# Patient Record
Sex: Male | Born: 1965 | Race: White | Hispanic: No | Marital: Married | Smoking: Former smoker
Health system: Southern US, Community
[De-identification: ages and names within clinical notes are randomized; demographics above are authoritative.]

## PROBLEM LIST (undated history)

## (undated) DIAGNOSIS — G4730 Sleep apnea, unspecified: Secondary | ICD-10-CM

## (undated) DIAGNOSIS — E039 Hypothyroidism, unspecified: Secondary | ICD-10-CM

## (undated) DIAGNOSIS — G8929 Other chronic pain: Secondary | ICD-10-CM

## (undated) DIAGNOSIS — K633 Ulcer of intestine: Secondary | ICD-10-CM

## (undated) DIAGNOSIS — N189 Chronic kidney disease, unspecified: Secondary | ICD-10-CM

## (undated) DIAGNOSIS — M549 Dorsalgia, unspecified: Secondary | ICD-10-CM

## (undated) DIAGNOSIS — F419 Anxiety disorder, unspecified: Secondary | ICD-10-CM

## (undated) DIAGNOSIS — F32A Depression, unspecified: Secondary | ICD-10-CM

## (undated) DIAGNOSIS — R748 Abnormal levels of other serum enzymes: Secondary | ICD-10-CM

## (undated) DIAGNOSIS — L509 Urticaria, unspecified: Secondary | ICD-10-CM

## (undated) DIAGNOSIS — K219 Gastro-esophageal reflux disease without esophagitis: Secondary | ICD-10-CM

## (undated) DIAGNOSIS — E785 Hyperlipidemia, unspecified: Secondary | ICD-10-CM

## (undated) DIAGNOSIS — M25552 Pain in left hip: Secondary | ICD-10-CM

## (undated) DIAGNOSIS — M1631 Unilateral osteoarthritis resulting from hip dysplasia, right hip: Secondary | ICD-10-CM

## (undated) DIAGNOSIS — I10 Essential (primary) hypertension: Secondary | ICD-10-CM

## (undated) DIAGNOSIS — K5792 Diverticulitis of intestine, part unspecified, without perforation or abscess without bleeding: Secondary | ICD-10-CM

## (undated) DIAGNOSIS — K529 Noninfective gastroenteritis and colitis, unspecified: Secondary | ICD-10-CM

## (undated) DIAGNOSIS — J40 Bronchitis, not specified as acute or chronic: Secondary | ICD-10-CM

## (undated) DIAGNOSIS — M79605 Pain in left leg: Secondary | ICD-10-CM

## (undated) DIAGNOSIS — F5104 Psychophysiologic insomnia: Secondary | ICD-10-CM

## (undated) DIAGNOSIS — L409 Psoriasis, unspecified: Secondary | ICD-10-CM

## (undated) DIAGNOSIS — I2510 Atherosclerotic heart disease of native coronary artery without angina pectoris: Secondary | ICD-10-CM

## (undated) DIAGNOSIS — E119 Type 2 diabetes mellitus without complications: Secondary | ICD-10-CM

## (undated) DIAGNOSIS — M1990 Unspecified osteoarthritis, unspecified site: Secondary | ICD-10-CM

## (undated) HISTORY — DX: Psychophysiologic insomnia: F51.04

## (undated) HISTORY — PX: SHOULDER SURGERY: SHX246

## (undated) HISTORY — PX: CERVICAL SPINE SURGERY: SHX589

## (undated) HISTORY — DX: Urticaria, unspecified: L50.9

## (undated) HISTORY — DX: Psoriasis, unspecified: L40.9

## (undated) HISTORY — PX: THYROIDECTOMY: SHX17

## (undated) HISTORY — DX: Diverticulitis of intestine, part unspecified, without perforation or abscess without bleeding: K57.92

## (undated) HISTORY — DX: Noninfective gastroenteritis and colitis, unspecified: K52.9

## (undated) HISTORY — DX: Abnormal levels of other serum enzymes: R74.8

## (undated) HISTORY — PX: BACK SURGERY: SHX140

## (undated) HISTORY — DX: Atherosclerotic heart disease of native coronary artery without angina pectoris: I25.10

## (undated) HISTORY — PX: JOINT REPLACEMENT: SHX530

## (undated) HISTORY — DX: Ulcer of intestine: K63.3

## (undated) HISTORY — PX: ROTATOR CUFF REPAIR: SHX139

## (undated) HISTORY — PX: APPENDECTOMY: SHX54

## (undated) HISTORY — DX: Hyperlipidemia, unspecified: E78.5

---

## 2001-12-24 ENCOUNTER — Emergency Department (HOSPITAL_COMMUNITY): Admission: EM | Admit: 2001-12-24 | Discharge: 2001-12-24 | Payer: Self-pay | Admitting: *Deleted

## 2001-12-24 ENCOUNTER — Encounter: Payer: Self-pay | Admitting: *Deleted

## 2002-05-24 ENCOUNTER — Encounter: Payer: Self-pay | Admitting: Internal Medicine

## 2002-05-24 ENCOUNTER — Emergency Department (HOSPITAL_COMMUNITY): Admission: EM | Admit: 2002-05-24 | Discharge: 2002-05-24 | Payer: Self-pay | Admitting: Emergency Medicine

## 2002-05-29 ENCOUNTER — Encounter: Payer: Self-pay | Admitting: Internal Medicine

## 2002-05-29 ENCOUNTER — Ambulatory Visit (HOSPITAL_COMMUNITY): Admission: RE | Admit: 2002-05-29 | Discharge: 2002-05-29 | Payer: Self-pay | Admitting: Internal Medicine

## 2002-10-08 ENCOUNTER — Encounter: Payer: Self-pay | Admitting: Family Medicine

## 2002-10-08 ENCOUNTER — Encounter (HOSPITAL_COMMUNITY): Admission: RE | Admit: 2002-10-08 | Discharge: 2002-11-07 | Payer: Self-pay | Admitting: Family Medicine

## 2002-10-09 ENCOUNTER — Encounter: Payer: Self-pay | Admitting: Family Medicine

## 2003-05-22 ENCOUNTER — Emergency Department (HOSPITAL_COMMUNITY): Admission: EM | Admit: 2003-05-22 | Discharge: 2003-05-22 | Payer: Self-pay | Admitting: Emergency Medicine

## 2004-01-12 ENCOUNTER — Emergency Department (HOSPITAL_COMMUNITY): Admission: EM | Admit: 2004-01-12 | Discharge: 2004-01-12 | Payer: Self-pay | Admitting: Emergency Medicine

## 2004-01-20 ENCOUNTER — Ambulatory Visit (HOSPITAL_COMMUNITY): Admission: RE | Admit: 2004-01-20 | Discharge: 2004-01-20 | Payer: Self-pay | Admitting: Family Medicine

## 2004-08-15 ENCOUNTER — Ambulatory Visit (HOSPITAL_COMMUNITY): Admission: RE | Admit: 2004-08-15 | Discharge: 2004-08-15 | Payer: Self-pay | Admitting: Internal Medicine

## 2004-12-20 ENCOUNTER — Ambulatory Visit (HOSPITAL_COMMUNITY): Admission: RE | Admit: 2004-12-20 | Discharge: 2004-12-20 | Payer: Self-pay | Admitting: Family Medicine

## 2005-03-13 ENCOUNTER — Emergency Department (HOSPITAL_COMMUNITY): Admission: EM | Admit: 2005-03-13 | Discharge: 2005-03-13 | Payer: Self-pay | Admitting: Emergency Medicine

## 2006-05-14 ENCOUNTER — Other Ambulatory Visit: Admission: RE | Admit: 2006-05-14 | Discharge: 2006-05-14 | Payer: Self-pay | Admitting: Unknown Physician Specialty

## 2006-07-10 ENCOUNTER — Ambulatory Visit (HOSPITAL_COMMUNITY): Admission: RE | Admit: 2006-07-10 | Discharge: 2006-07-11 | Payer: Self-pay | Admitting: Surgery

## 2006-07-10 ENCOUNTER — Encounter (INDEPENDENT_AMBULATORY_CARE_PROVIDER_SITE_OTHER): Payer: Self-pay | Admitting: *Deleted

## 2006-07-16 ENCOUNTER — Emergency Department (HOSPITAL_COMMUNITY): Admission: EM | Admit: 2006-07-16 | Discharge: 2006-07-16 | Payer: Self-pay | Admitting: Emergency Medicine

## 2006-09-13 ENCOUNTER — Ambulatory Visit: Payer: Self-pay | Admitting: Orthopedic Surgery

## 2006-09-17 ENCOUNTER — Ambulatory Visit (HOSPITAL_COMMUNITY): Admission: RE | Admit: 2006-09-17 | Discharge: 2006-09-17 | Payer: Self-pay | Admitting: Orthopedic Surgery

## 2006-09-24 ENCOUNTER — Ambulatory Visit: Payer: Self-pay | Admitting: Orthopedic Surgery

## 2007-05-09 HISTORY — PX: CARDIAC CATHETERIZATION: SHX172

## 2007-06-19 ENCOUNTER — Ambulatory Visit (HOSPITAL_COMMUNITY): Admission: RE | Admit: 2007-06-19 | Discharge: 2007-06-19 | Payer: Self-pay | Admitting: Family Medicine

## 2007-10-19 ENCOUNTER — Emergency Department (HOSPITAL_COMMUNITY): Admission: EM | Admit: 2007-10-19 | Discharge: 2007-10-19 | Payer: Self-pay | Admitting: Emergency Medicine

## 2008-02-07 ENCOUNTER — Observation Stay (HOSPITAL_COMMUNITY): Admission: EM | Admit: 2008-02-07 | Discharge: 2008-02-10 | Payer: Self-pay | Admitting: Emergency Medicine

## 2008-03-05 ENCOUNTER — Ambulatory Visit (HOSPITAL_COMMUNITY): Admission: RE | Admit: 2008-03-05 | Discharge: 2008-03-05 | Payer: Self-pay | Admitting: Internal Medicine

## 2008-07-06 HISTORY — PX: COLONOSCOPY: SHX174

## 2008-07-22 ENCOUNTER — Ambulatory Visit (HOSPITAL_COMMUNITY): Admission: RE | Admit: 2008-07-22 | Discharge: 2008-07-22 | Payer: Self-pay | Admitting: Internal Medicine

## 2008-08-19 ENCOUNTER — Ambulatory Visit (HOSPITAL_COMMUNITY): Admission: RE | Admit: 2008-08-19 | Discharge: 2008-08-19 | Payer: Self-pay | Admitting: Gastroenterology

## 2008-08-19 ENCOUNTER — Ambulatory Visit: Payer: Self-pay | Admitting: Gastroenterology

## 2008-08-19 ENCOUNTER — Telehealth: Payer: Self-pay | Admitting: Gastroenterology

## 2008-08-19 DIAGNOSIS — K759 Inflammatory liver disease, unspecified: Secondary | ICD-10-CM | POA: Insufficient documentation

## 2008-08-19 DIAGNOSIS — R197 Diarrhea, unspecified: Secondary | ICD-10-CM | POA: Insufficient documentation

## 2008-08-19 DIAGNOSIS — K625 Hemorrhage of anus and rectum: Secondary | ICD-10-CM | POA: Insufficient documentation

## 2008-08-20 ENCOUNTER — Encounter: Payer: Self-pay | Admitting: Gastroenterology

## 2008-08-20 ENCOUNTER — Ambulatory Visit: Payer: Self-pay | Admitting: Gastroenterology

## 2008-08-20 ENCOUNTER — Ambulatory Visit (HOSPITAL_COMMUNITY): Admission: RE | Admit: 2008-08-20 | Discharge: 2008-08-20 | Payer: Self-pay | Admitting: Gastroenterology

## 2008-08-26 ENCOUNTER — Encounter: Payer: Self-pay | Admitting: Gastroenterology

## 2008-08-26 ENCOUNTER — Encounter (INDEPENDENT_AMBULATORY_CARE_PROVIDER_SITE_OTHER): Payer: Self-pay

## 2008-09-18 ENCOUNTER — Emergency Department (HOSPITAL_COMMUNITY): Admission: EM | Admit: 2008-09-18 | Discharge: 2008-09-18 | Payer: Self-pay | Admitting: Emergency Medicine

## 2008-11-20 ENCOUNTER — Emergency Department (HOSPITAL_COMMUNITY): Admission: EM | Admit: 2008-11-20 | Discharge: 2008-11-20 | Payer: Self-pay | Admitting: Emergency Medicine

## 2008-11-24 ENCOUNTER — Encounter (INDEPENDENT_AMBULATORY_CARE_PROVIDER_SITE_OTHER): Payer: Self-pay | Admitting: *Deleted

## 2008-12-17 ENCOUNTER — Ambulatory Visit: Payer: Self-pay | Admitting: Gastroenterology

## 2008-12-17 DIAGNOSIS — R1032 Left lower quadrant pain: Secondary | ICD-10-CM | POA: Insufficient documentation

## 2008-12-17 DIAGNOSIS — Z8639 Personal history of other endocrine, nutritional and metabolic disease: Secondary | ICD-10-CM

## 2008-12-17 DIAGNOSIS — Z862 Personal history of diseases of the blood and blood-forming organs and certain disorders involving the immune mechanism: Secondary | ICD-10-CM | POA: Insufficient documentation

## 2008-12-23 LAB — CONVERTED CEMR LAB
ALT: 37 units/L (ref 0–53)
AST: 23 units/L (ref 0–37)
Albumin: 4.4 g/dL (ref 3.5–5.2)
Alkaline Phosphatase: 72 units/L (ref 39–117)
Bilirubin, Direct: 0.1 mg/dL (ref 0.0–0.3)
Indirect Bilirubin: 0.5 mg/dL (ref 0.0–0.9)
Total Bilirubin: 0.6 mg/dL (ref 0.3–1.2)
Total Protein: 7.2 g/dL (ref 6.0–8.3)

## 2009-03-25 IMAGING — CR DG CHEST 2V
2 series · 2 of 2 positions shown · non-contrast
Comparison: 02/07/2008

CLINICAL DATA: Chest pain.  Shortness of breath since [REDACTED].
Heart catheterization done on [DATE].

CHEST - 2 VIEW

[view not recorded (1 of 2)]
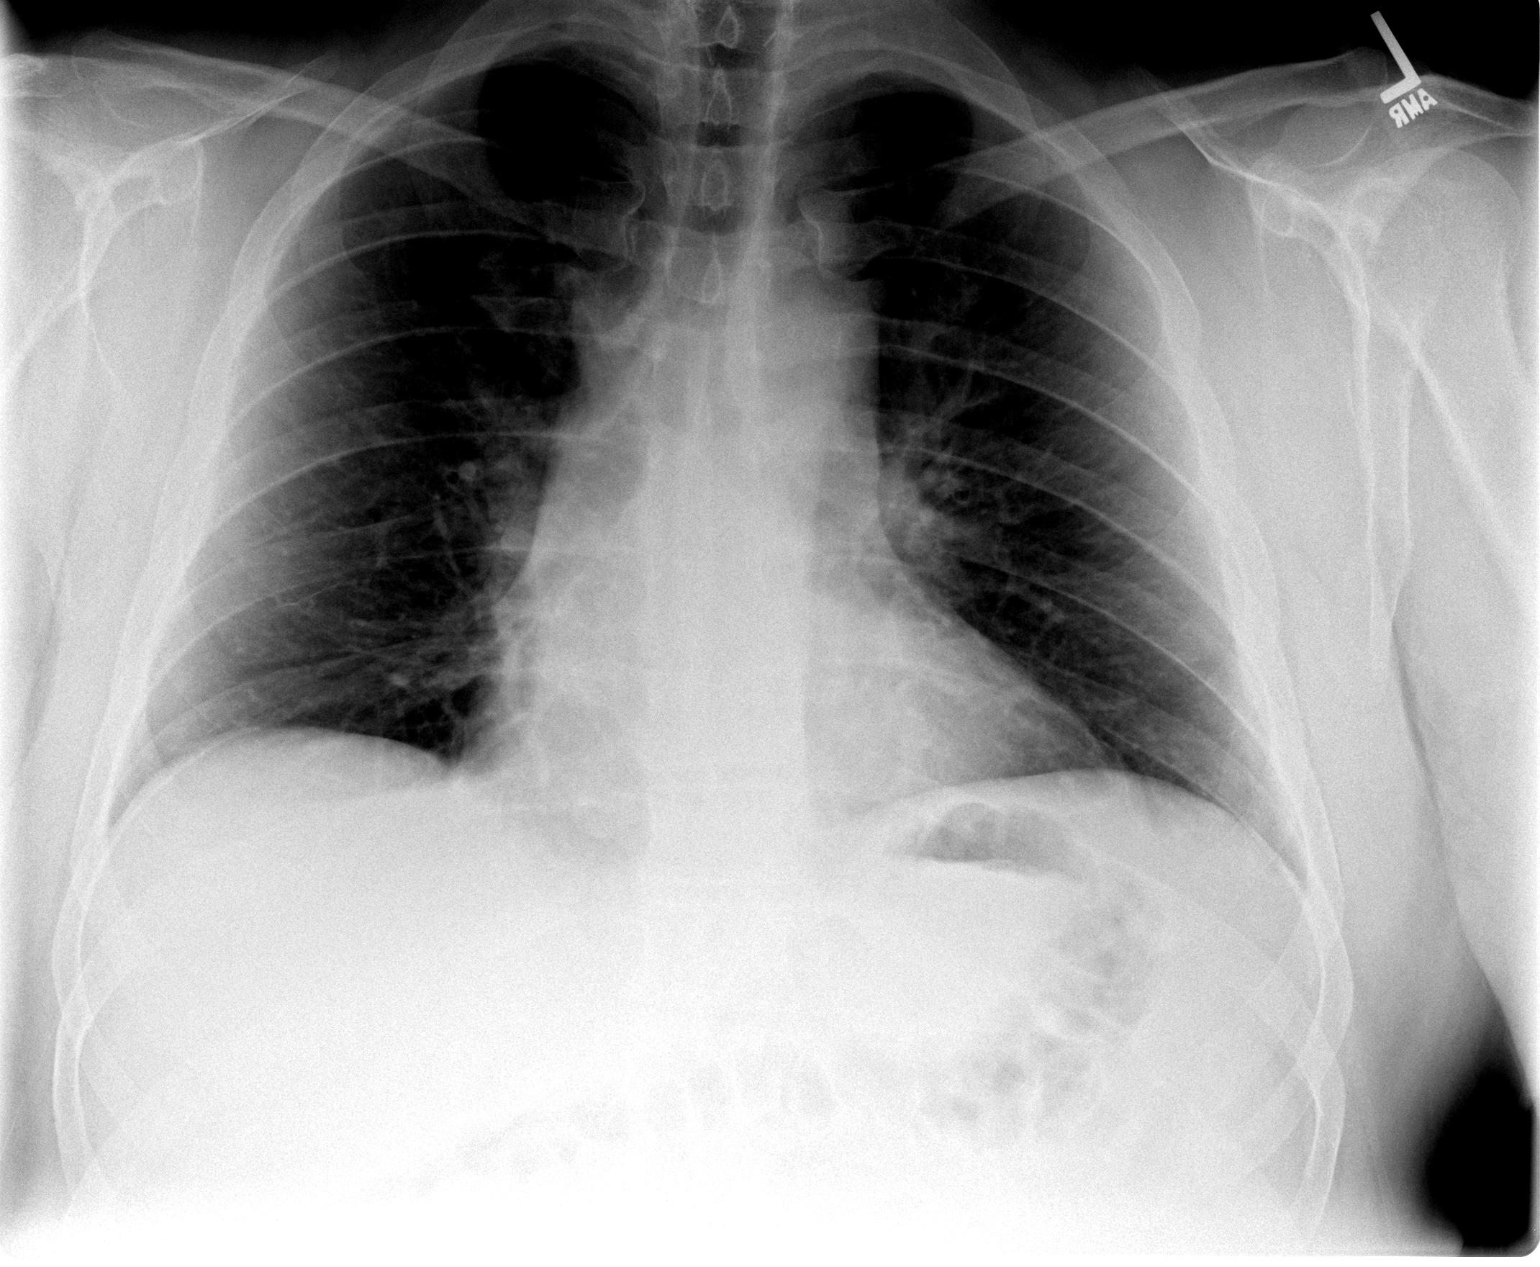

[view not recorded (2 of 2)]
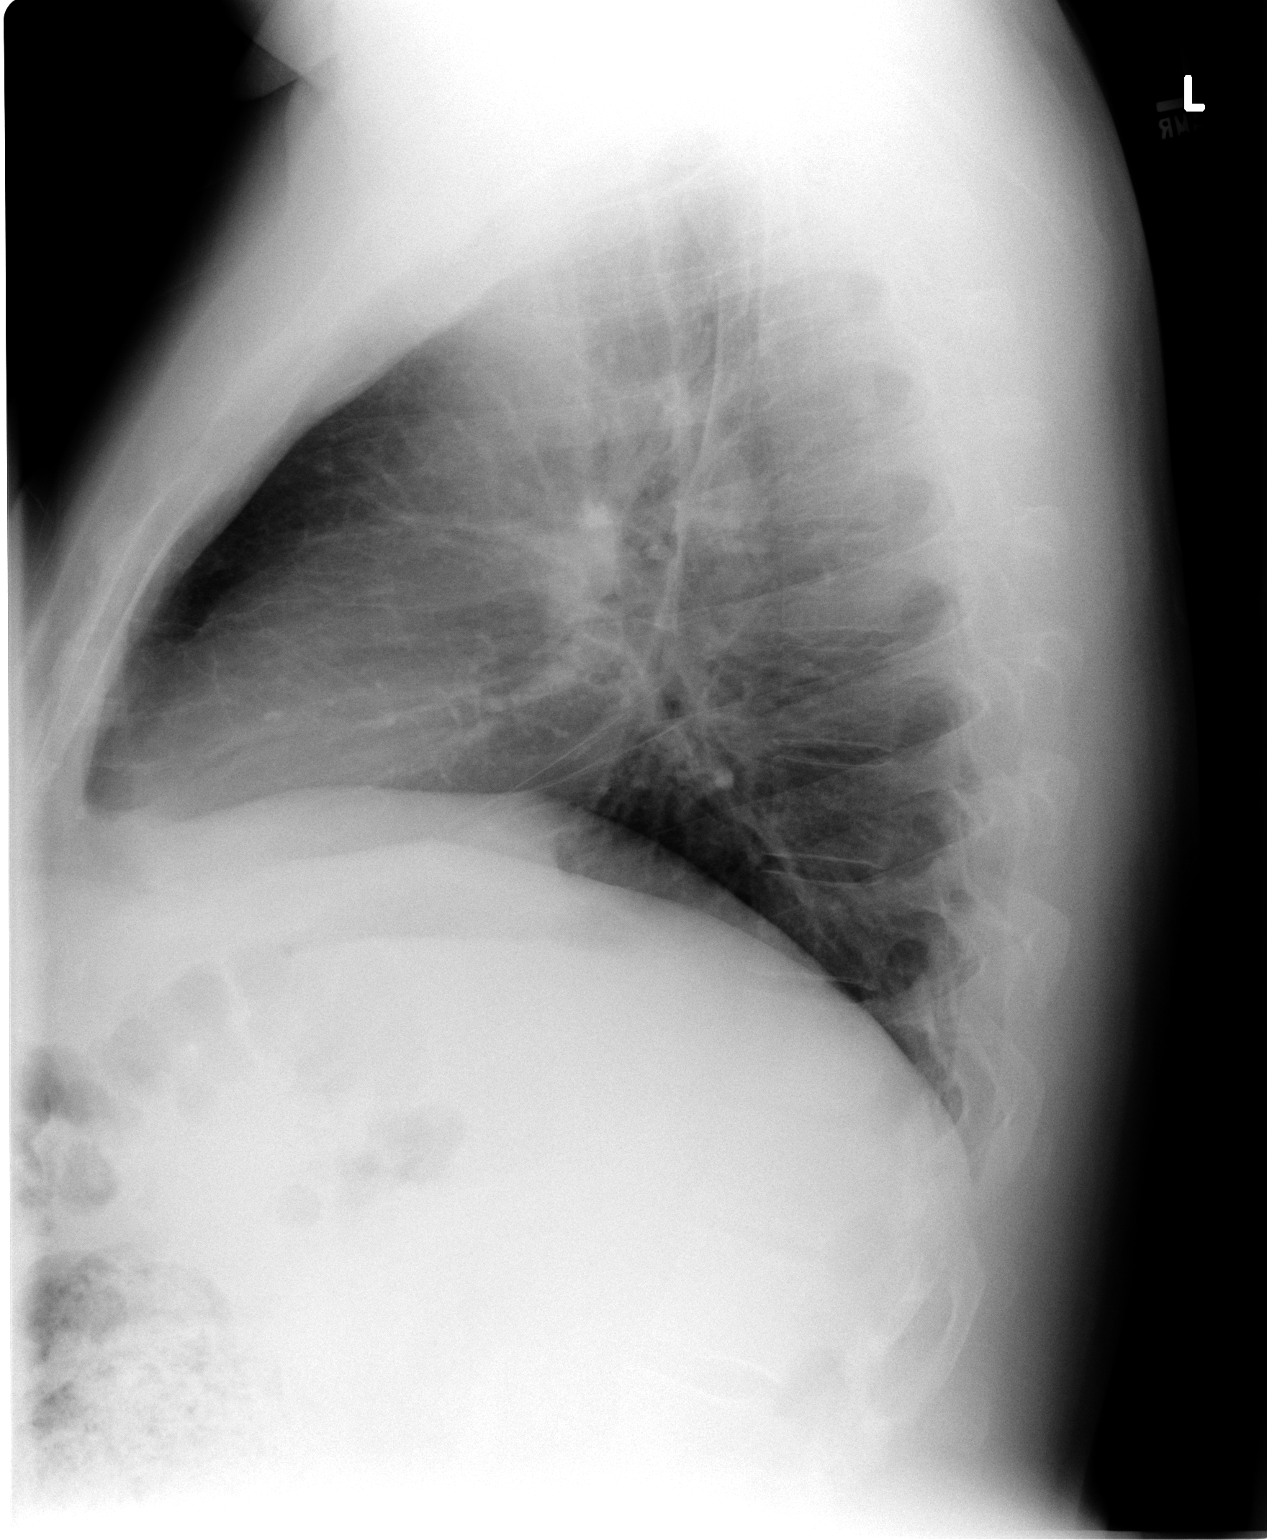

[2 of 2 positions shown; findings below may reference images not displayed]

FINDINGS: Cardiac size is normal.  Lungs are free of focal
consolidations and pleural effusions.  No pneumothorax.  No
pulmonary edema.  Bones have normal appearance.
IMPRESSION: No evidence for acute cardiopulmonary disease.

## 2009-06-07 ENCOUNTER — Encounter (INDEPENDENT_AMBULATORY_CARE_PROVIDER_SITE_OTHER): Payer: Self-pay | Admitting: *Deleted

## 2010-03-06 ENCOUNTER — Emergency Department (HOSPITAL_COMMUNITY): Admission: EM | Admit: 2010-03-06 | Discharge: 2010-03-06 | Payer: Self-pay | Admitting: Emergency Medicine

## 2010-03-15 ENCOUNTER — Encounter: Admission: RE | Admit: 2010-03-15 | Discharge: 2010-03-15 | Payer: Self-pay | Admitting: Orthopedic Surgery

## 2010-04-21 ENCOUNTER — Inpatient Hospital Stay (HOSPITAL_COMMUNITY): Admission: EM | Admit: 2010-04-21 | Discharge: 2010-04-24 | Payer: Self-pay | Source: Home / Self Care

## 2010-05-03 ENCOUNTER — Inpatient Hospital Stay (HOSPITAL_COMMUNITY): Admission: EM | Admit: 2010-05-03 | Discharge: 2010-05-06 | Payer: Self-pay | Source: Home / Self Care

## 2010-05-08 HISTORY — PX: LAPAROSCOPIC SIGMOID COLECTOMY: SHX5928

## 2010-05-26 ENCOUNTER — Ambulatory Visit
Admission: RE | Admit: 2010-05-26 | Discharge: 2010-05-26 | Payer: Self-pay | Source: Home / Self Care | Attending: Gastroenterology | Admitting: Gastroenterology

## 2010-05-26 DIAGNOSIS — Z8719 Personal history of other diseases of the digestive system: Secondary | ICD-10-CM | POA: Insufficient documentation

## 2010-05-28 NOTE — H&P (Signed)
Andrew Love, Andrew Love NO.:  [id number]  MEDICAL RECORD NO.:  [id number]          PATIENT TYPE:  EMS  LOCATION:  ED                            FACILITY:  [facility]  PHYSICIAN:  Osvaldo Shipper, MD     DATE OF BIRTH:  [birth year]  DATE OF ADMISSION:  05/04/2010 DATE OF DISCHARGE:  LH                             HISTORY & PHYSICAL   PRIMARY CARE PHYSICIAN:  Dr. Artis Delay.  ADMISSION DIAGNOSES: 1. Abdominal pain, possible abscess. 2. Recent acute diverticulitis. 3. History of hypertension. 4. History of hypothyroidism.  CHIEF COMPLAINT:  Abdominal pain since yesterday.  HISTORY OF PRESENT ILLNESS:  The patient is a 45 year old Caucasian male, who was recently discharged from the hospital on April 24, 2010, after being diagnosed with acute sigmoid diverticulitis.  He was sent home on Cipro and Flagyl.  He tells me that his pain level was adequately controlled, he was able to tolerate a p.o. a diet quite well and then yesterday afternoon his pain got worse.  It is located in the left lower quadrant radiating to the right.  When he is at rest, the pain is 4/10 in intensity, but when he has any movement it is 10/10 in intensity.  He has been nauseous.  He denies any vomiting.  No diarrhea. Last bowel movement was at lunchtime yesterday.  He denies any fever. He has been feeling clammy, but no chills.  He denies any blood in the stool.  The pain is aggravated by movement and relieved by rest.  He denies any dysuria or any penile discharge.  He also tells me that he has been compliant with all his medications.  MEDICATIONS AT HOME: 1. Flagyl 500 mg three times daily. 2. Ciprofloxacin 500 mg twice daily. 3. Percocet 5/325 as needed. 4. Synthroid 200 mcg daily. 5. Benicar 20 mg daily. 6. Zolpidem 10 mg at bedtime. 7. Promethazine 25 mg as needed for nausea.  ALLERGIES:  NO KNOWN DRUG ALLERGIES.  PAST MEDICAL HISTORY: 1. Positive for hypertension. 2.  Hypothyroidism. 3. Recently diagnosed diverticulitis. 4. He had a cardiac cath in 2009, which did not show any significant     coronary artery disease. 5. He had a colonoscopy in 2010, which showed colitis of unclear     etiology.  Malrotation of the gut, sigmoid colon and rectal polyps     were also noted.  Also left-sided diverticula was seen in the     colon. 6. He had a total thyroidectomy in 2008, for thyroid cysts, which were     found to be benign. 7. He has had an appendectomy. 8. He has had left shoulder rotator cuff surgery as well.  SOCIAL HISTORY:  He lives in [place] with his wife.  He works in the [employer] in [place].  He smokes half-pack of cigarettes on a daily basis.  No alcohol use.  No illicit drug use.  FAMILY HISTORY:  Mother has breast cancer, hypothyroidism, hypertension and diverticulosis.  Grandmother had lung cancer.  Grandfather has lung cancer.  Father has skin cancer.  REVIEW OF SYSTEMS:  GENERAL:  Positive for weakness and  malaise.  HEENT: Unremarkable.  CARDIOVASCULAR:  Unremarkable.  RESPIRATORY: Unremarkable.  GI:  As in HPI.  GU:  Unremarkable.  NEUROLOGIC: Unremarkable.  PSYCHIATRIC:  Unremarkable.  DERMATOLOGICAL: Unremarkable.  Other systems reviewed and found to be negative.  PHYSICAL EXAMINATION:  VITAL SIGNS:  Temperature 98.6, blood pressure 149/84, heart rate 82, respiratory rate 20, saturation 96% on room air. GENERAL:  This is an overweight white male in no distress. HEENT:  Head is normocephalic, atraumatic.  Pupils are equal reacting. No pallor, no icterus.  Oral mucous membranes slightly dry.  No oral lesions are noted. NECK:  Soft and supple.  No thyromegaly is appreciated.  No cervical, supraclavicular or inguinal lymphadenopathy is present. CARDIOVASCULAR:  S1-S2 is normal, regular.  No S3-S4, rubs, murmurs or bruits. LUNGS:  Clear to auscultation bilaterally with no wheezing, rales or rhonchi. ABDOMEN:  Soft.   Tenderness is present in the lower abdomen, more so on the left lower quadrant with no rigidity or guarding.  Minimal rebound is present.  No masses or organomegaly appreciated.  Bowel sounds are sluggish. GU:  External genitalia appeared to be normal. MUSCULOSKELETAL:  Normal muscle mass and tone. NEUROLOGICALLY:  He is alert and oriented x3.  No focal neurological deficits are present. SKIN:  Does not reveal any rashes.  LABORATORY DATA:  CBC is completely normal with a normal white count. His CMET shows normal electrolytes.  Glucose 136.  LFTs are normal. Urine; the only abnormality on his UA is specific gravity greater than 1.030.  Imaging studies done today included an acute abdominal series, which showed no acute findings.  ASSESSMENT:  This is a 45 year old Caucasian male, who was recently diagnosed with acute diverticulitis and was discharged on oral antibiotics and has come in with worsening pain.  There is a possibility that there may be a developing abscess, localized perforation is also a possibility.  No free air is noted on the abdominal series.  The patient appears to be quite stable.  There is no evidence for sepsis.PLAN: 1. Abdominal pain with recent diverticulitis.  He warrants a repeat CT     to make sure there is no abscess.  So this is pending at this time.     We will give him a dose of Zosyn.  We will keep him n.p.o. and     depending on the findings of CT, further management decisions will     be made.  Pain medication will be provided.  He will be given IV     fluids. 2. History of hypertension.  Hold off on Benicar for now until he is     more stable. 3. History of hypothyroidism.  He will resume Synthroid when he is     able to take orally. 4. DVT prophylaxis will be given via sequential compressive devices.  The patient told me that if there is anything surgical, he would prefer to be transferred to [facility].  So we will basically await a CT at this  time.  Osvaldo Shipper, MD     GK/MEDQ  D:  05/04/2010  T:  05/04/2010  Job:  [id number]  cc:   Madelin Rear. Sherwood Gambler, MD Fax: [phone]  Electronically Signed by Osvaldo Shipper MD on 05/28/2010 06:46:48 AM

## 2010-06-07 NOTE — Letter (Signed)
Summary: CT SCAN OF ABD/PELVIS ORDER  CT SCAN OF ABD/PELVIS ORDER   Imported By: Elinor Parkinson 08/20/2008 12:49:20  _____________________________________________________________________  External Attachment:    Type:   Image     Comment:   External Document

## 2010-06-07 NOTE — Letter (Signed)
Summary: Recall, Labs Needed  [facility]  [address]   [place], [place] [address]   Phone: [phone]  Fax: [phone]    August 26, 2008  Andrew Love [address] [place], [place]  [address] 1966/04/24   Dear [name],   Our records indicate it is time to repeat your blood work.  You can take the enclosed form to the lab on or near the date indicated.  Please make note of the new location of the lab:   [address], 2nd floor   [facility]  Our office will call you within a week to ten business days with the results.  If you do not hear from [place] in 10 business days, you should call the office.  If you have any questions regarding this, call the office at [phone], and ask for the nurse.  Labs are due on 09/25/08.   Sincerely,    Hendricks Limes LPN  [facility] Ph: [phone]   Fax: [phone]

## 2010-06-07 NOTE — Consult Note (Signed)
Andrew Love, Andrew Love              ACCOUNT NO.:  [id number]  MEDICAL RECORD NO.:  [id number]          PATIENT TYPE:  INP  LOCATION:  A213                          FACILITY:  [facility]  PHYSICIAN:  R. Roetta Sessions, M.D. DATE OF BIRTH:  18-Nov-1965  DATE OF CONSULTATION:  05/04/2010 DATE OF DISCHARGE:  03/06/2010                                CONSULTATION   __________  REASON FOR CONSULT:  Diverticulitis.  HISTORY OF PRESENT ILLNESS:  Andrew Love is a pleasant 45 year old male who was actually just in the hospital from December 15 through December 18 secondary to acute sigmoid diverticulitis.  He was discharged home in stable condition with 10 days of Cipro and Flagyl. He states that, starting this past Monday, which was December 26, he started to have moderate left lower quadrant pain.  It was intermittent in nature, described as sharp and stabbing.  It then continued to escalate until yesterday, when it was worsened and a 10/10.  He states the moving and coughing make it worse.  He denies eating inappropriate foods.  He stuck with a low fat, low residue diet and slowly advanced his diet.  He reports that he did take his antibiotics as instructed. He is having loose bowel movements postprandially prior to admission, about 3 a day.  He denies any melena or hematochezia.  It is soft.  As of note, he did have his first episode of diverticulitis in August 2010. He was seen in the [facility].  This was precipitated by seeds, as he states this is when the pain occurred back in August.  He was actually seen by our office in August 2010 following this episode.  His last colonoscopy was done in April 2010, showing normal terminal ileum, normal cecum, ascending colon, transverse colon. Biopsies were obtained.  Multiple areas of patchy erythema with frequent ulcerations seen from splenic flexure to sigmoid colon.  Frequent left- sided diverticula.  No masses or AVMs.  A  normal rectum.  A 3-mm sigmoid colon and rectal polyps.  Pathology report showed biopsies suggesting ischemia.  This was thought to be likely an acute viral gastroenteritis which precipitated small-vessel ischemia due to volume depletion.  The stool studies that were performed were negative except for lactoferrin.  PAST MEDICAL HISTORY:  Coronary artery disease, elevated liver enzymes, first episode of diverticulitis in August 2010, hypertension, hypothyroidism.  PAST SURGICAL HISTORY:  Cardiac cath in 2009 which did not show any significant coronary artery disease.  Total thyroidectomy in 2008 secondary to thyroid cysts which were benign.  Appendectomy.  Left shoulder rotator cuff surgery.  SOCIAL HISTORY:  Lives in [place] with his wife.  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  He quit smoking on December 15.  He is an occasional drinker.  No illicit drug use.  He used to smoke a half pack of cigarettes on a daily basis but as mentioned, he quit smoking December 15.  FAMILY HISTORY:  Mother has breast cancer, hypothyroidism, hypertension, diverticulosis.  Father had skin cancer.  Grandmother had lung cancer. Grandfather had lung cancer.  ALLERGIES:  No known drug allergies.  MEDICATIONS: 1. Cipro 400 mg IV. 2. Flagyl 500 mg IV. 3. Zosyn 3.375 g IV. 4. Tylenol. 5. Albuterol. 6. Dilaudid 0.5 1 mg IV every 3 hours as needed. 7. Zofran 4 mg IV every 6 hours as needed. 8. Phenergan.  REVIEW OF SYSTEMS:  Negative except as mentioned in HPI.  PHYSICAL EXAM:  Blood pressure is 124/83, pulse of 56, respirations 18, temperature 97.3, and 98% on room air. IN GENERAL:  He is in no apparent distress, alert and oriented. HEENT:  Head is normocephalic, atraumatic.  Sclerae without icterus. Conjunctivae clear.  Oral membranes moist and pink. NECK:  Without thyromegaly,is soft and supple. CARDIAC:  S1, S2 present. LUNGS:  Clear to auscultation bilaterally.  No wheezes, ,  rales or rhonchi. ABDOMEN:  Soft.  There is mild tenderness to palpation in the left lower quadrant.  No rebound or guarding.  No mass or hepatic splenomegaly noted.  Positive bowel sounds. EXTREMITIES:  Without edema. SKIN:  Warm, dry and intact.  PERTINENT LABS FOR THIS ADMISSION:  CBC on December 27:  Normal white count of 8.4, hemoglobin and hematocrit 14.8 and 41.1, platelets 233,000.  Electrolytes are normal.  Glucose is slightly elevated at 101. Hepatic function panel normal.  RADIOLOGY:  CT of abdomen and pelvis, done December 28, shows sigmoid diverticulosis and minimal sigmoid diverticulitis with significant improvement.  No complicating features, as well as a small right inguinal hernia, containing fat.  Acute abdominal series was done which shows no acute finding.  ASSESSMENT AND PLAN:  Andrew Love is a pleasant 45 year old male who was actually discharged from the hospital December 18 with his second bout of diverticulitis.  He was discharged home on p.o. antibiotics and was actually set to finish these today.  He had report taking these as instructed.  He reports advancing his diet slowly.  He presents now with reports of acute onset of pain on Monday that worsened, requiring admission to the hospital for pain control.  It is of note his CT is reassuring as it is showing significant improvement.  Of note, he does state that he would like to have surgery in the future, as he does not want to deal with this anymore.  PLAN: 1. Continue supportive measures and pain control. 2. Clear liquids for right now. We will gradually advance his diet as     tolerated. 3. We will continue to follow him.  Thank you for the referral of this nice gentleman.    ______________________________ Andrew Love, ANP-BC   ______________________________ R. Roetta Sessions, M.D.    AS/MEDQ  D:  05/04/2010  T:  05/04/2010  Job:  [id number]  Electronically Signed by Andrew Love  on 05/25/2010  04:22:11 PM Electronically Signed by Lorrin Goodell M.D. on 06/07/2010 01:36:14 PM

## 2010-06-07 NOTE — Letter (Signed)
Summary: Appointment Reminder  [facility]  [address]   [place], [place] [address]   Phone: [phone]  Fax: [phone]       June 07, 2009   Andrew Love [address] [place], [place]  [address] 22-Aug-1965    Dear [name],  We have been unable to reach you by phone to schedule a follow up   appointment that was recommended for you by Dr. Darrick Penna. It is very   important that we reach you to schedule an appointment. We hope that you  allow [place] to participate in your health care needs. Please contact us at  [phone] at your earliest convenience to schedule your appointment.  Sincerely,    [facility] R. Roetta Sessions, M.D.    Kassie Mends, M.D. Lorenza Burton, FNP-BC    Tana Coast, PA-C Phone: [phone]    Fax: [phone]

## 2010-06-07 NOTE — Progress Notes (Signed)
  Called pt at night phone(EChart) and on home FI:[id number]. LMOM-night ph-plz call to discuss symptoms and can determine if pt needs to be seen in ofc or go to ED.  Jacques Navy MD  August 19, 2008 2:27 PM  called [phone].  Pt seen in clinic [id number]. Jacques Navy MD  August 19, 2008 2:56 PM

## 2010-06-07 NOTE — Miscellaneous (Signed)
Summary: Orders Update  Clinical Lists Changes  Orders: Added new Test order of T-Hepatic Function ([id number]) - Signed 

## 2010-06-07 NOTE — Letter (Signed)
Summary: TCS ORDER  TCS ORDER   Imported By: Elinor Parkinson 08/20/2008 13:02:45  _____________________________________________________________________  External Attachment:    Type:   Image     Comment:   External Document

## 2010-06-07 NOTE — Assessment & Plan Note (Signed)
Summary: diverticulitis/ams   Visit Type:  Follow-up Visit Primary Care Provider:  Sherwood Gambler, M.D.  Chief Complaint:  diverticutitis.  History of Present Illness: Had not been following diet and ate a pickle prior to episode. felt constipated priro to attack and felt like it had to push forever to have a movement. At work, nausea and vomiting, then muscle cramp or spasm and thought might have "diverticulitis attack", ZO:[id number]/JXBJ and pain went away. Seen in [facility] ED.  Watching what he eats now.  Now abdominal pain is gone. Rare abdominal pain. BMs pretty good. No nausea. Bms daily w/o effort.  Pt also had questions regarding his liver enzymes. APRIL 2010-AST 40, ALT 60, Lipase 14. He has not had any EtOH for over a month.  Current Medications (verified): 1)  Benicar 20 Mg Tabs (Olmesartan Medoxomil) .... Take 1 Tablet By Mouth Once A Day 2)  Synthroid 200 Mcg Tabs (Levothyroxine Sodium) .... Take 1 Tablet By Mouth Once A Day 3)  Calcium 1200 Mg .... Once Daily 4)  Fish Oil 1000 Mg .... Take 1 Tablet By Mouth Two Times A Day 5)  Ambien 10 Mg Tabs (Zolpidem Tartrate) .... Take 1 Tab By Mouth At Bedtime 6)  Potassium .... Daily  Allergies (verified): No Known Drug Allergies  Past History:  Past Medical History: Coronary Artery Disease Elevated liver enzymes. BLOODY DIARRHEA-->APRIL 2010 TCS: Colitis, NSAID v. ischemia, malrotation of the gut, DIVERTICULOSIS(L), hyperplastic sigmoid colon and rectal polyps.  Review of Systems       Negative serologies for HA/B/C in APR 2010.  CTAP JULY 2010: descending colonic focal segmental wall thickening and pericolonic stranding, most typical for acute diverticulitis without abscess formation or perforation.  Vital Signs:  Patient profile:   45 year old male Height:      73 inches Weight:      237 pounds BMI:     31.38 Temp:     98.2 degrees F oral Pulse rate:   68 / minute BP sitting:   130 / 88  (left arm) Cuff size:    regular  Vitals Entered By: Hendricks Limes LPN (December 17, 2008 9:48 AM)  Physical Exam  General:  Well developed, well nourished, no acute distress. Lungs:  Clear throughout to auscultation. Heart:  Regular rate and rhythm; no murmurs, rubs,  or bruits. Abdomen:  Soft, nontender and nondistended.  Normal bowel sounds. obese.   Neurologic:  Alert and  oriented x4;  grossly normal neurologically.  Impression & Recommendations:  Problem # 1:  ABDOMINAL PAIN, LEFT LOWER QUADRANT (ICD-789.04) 1st episode of diverticulitis. May have occasional beer and Sweet Tea. High fiber diet and avoid seeds because it appeared to precipitate an episode of diverticulitis. HFD HO given. OPV in 6 mos.  CC: Sherwood Gambler, M.D.  Orders: T-Hepatic Function [phone])  Problem # 2:  LIVER FUNCTION TESTS, ABNORMAL, HX OF (ICD-V12.2)  Likely 2o to EtOH and obesity. HFP today. Will call with results. Encouraged weights loss,  ~20 lbs. May need low fat diet HO as well. Consider w/u for AIH, Wilson's, or hemachromatosis if liver enzymes still elevated.  Orders: T-Hepatic Function [id number]) Prescriptions: AMBIEN 10 MG TABS (ZOLPIDEM TARTRATE) Take 1 tab by mouth at bedtime  #30 x 0   Entered and Authorized by:   Jacques Navy MD   Signed by:   Jacques Navy MD on 12/17/2008   Method used:   Handwritten   RxID:   [id number]   Appended Document: Orders Update-charge  Clinical Lists Changes  Orders: Added new Service order of Est. Patient Level III (40102) - Signed

## 2010-06-07 NOTE — Assessment & Plan Note (Signed)
Summary: Andrew Love BLEEDING/AMS   Visit Type:  Initial Consult Referring Provider:  Claybon Jabs, NP-C Primary Care Provider:  Sherwood Gambler, M.D.  Chief Complaint:  diarrhea/rectal bleeding.  History of Present Illness: Friday he was in his usual state of health. On Sat night he ate steak. SUN/MON he had loose stool and mild abdominal pain. Wed AM, he woke up with pain in RLQ which radiated to the LLQ. It was 10/10 in sverity. He had 3 episodes of diarrhea and then 4 more episodes. The last 4 BMs had blood in them.  He has no sick contacts. He did vomit. He has back pain and takes Toradol and Ibuprofen as needed. He's been on ASA daily since 10/09 because he has NonOb CAD. He was on CIP in March for preseumed diveticulitis. He has only travelled to [place]. He works for EMS, but sits in an office. He know noone with CDIFF and hasn't been visitin NH/hospitals. He has no fever/chills. He has chronic joint pain ever since he had thyroid surgery. He had ulcers in his mouth 1-2 weeks ago. He has not had any rashes on his shins. He has well water.  he was seen by PCP today. Labs were reviewed. They revealed WBC: 8.5, Hb 16.6, plt 242, Cr 1.11, AST 40, ALT 60, Lipase 14.  Preventive Screening-Counseling & Management     Smoking Status: quit  Current Medications (verified): 1)  Benicar 20 Mg Tabs (Olmesartan Medoxomil) .... Take 1 Tablet By Mouth Once A Day 2)  Synthroid 200 Mcg Tabs (Levothyroxine Sodium) .... Take 1 Tablet By Mouth Once A Day 3)  Calcium 1200 Mg .... Once Daily 4)  Fish Oil 1000 Mg .... Take 1 Tablet By Mouth Two Times A Day 5)  Ambien 10 Mg Tabs (Zolpidem Tartrate) .... Take 1 Tab By Mouth At Bedtime 6)  Asa 325 .... Take 1 Tablet By Mouth Once A Day 7)  Potassium .... Daily  Allergies (verified): No Known Drug Allergies  Past History:  Past Medical History:    Coronary Artery Disease  Past Surgical History:    Thyroid Surgery  Family History:    No FH of Colon Cancer:  Social  History:    Patient is a former smoker: 15pk*yr, quit 10/09    Alcohol Use - yes-3 beers over the weekend.    Smoking Status:  quit  Review of Systems       Per HPI otherwise all systems negative.  Vital Signs:  Patient profile:   45 year old male Height:      73 inches Weight:      241.50 pounds BMI:     31.98 Temp:     97.8 degrees F oral Pulse rate:   64 / minute BP sitting:   100 / 78  (left arm) Cuff size:   regular  Vitals Entered By: Cloria Spring LPN (August 19, 2008 3:29 PM)  Physical Exam  General:  Well developed, well nourished, no acute distress. Head:  Normocephalic and atraumatic. Eyes:  PERRLA, no icterus. Mouth:  No deformity or lesions, dentition normal. No ulcerated lesions.   Neck:  Supple; no masses or lymphadenopathy. Lungs:  Clear throughout to auscultation. Heart:  Regular rate and rhythm; no murmurs, rubs,  or bruits. Abdomen:  Soft, nondistended. No masses, hepatosplenomegaly or hernias noted. Normal bowel sounds. Mild TTP in LLQ without rebound. RLQ nontender  Msk:  Symmetrical with no gross deformities. Normal posture. Extremities:  No edema or deformities noted. Neurologic:  Alert and  oriented x4;  grossly normal neurologically. Skin:  No pretibial lesions.  Impression & Recommendations:  Problem # 1:  RECTAL BLEEDING (ICD-569.3) Assessment New He had the acute onset of abdominal pain followed by diarrhea and rectal bleeding. The differential includes C DIFF colitis due to recent Abx use or infectious gastroenetritis and low likelihood of divertivulitis, ischemic colitis or IBD. Add CIP/FLAG for 10 days. Obtain stool for culture, lactoferrin, and C DIFF toxin. STAT CT A/P  to evaluate for diverticulitis. If pt has no evidence of diverticulitis, will proceed with TCS-Halflytely prep. He was given rx for Phenergan and Vicodin #30. RPV will be scheduled after the TCS.   Orders: Consultation Level V (99245)  ADDENDUM: [id number] 1945-CALLED PT. No CT  evidence for diverticulitis. Instructed to prep for TCS. MAy have a clear liquid breakfast.  CC: [place], NP-C, Sherwood Gambler, M.D.  Problem # 2:  HEPATITIS (ICD-573.3) Assessment: New He has mildly elevated liver enzymes likely secondary to recent EtOH or fatty liver disease from obesity. Will need viral serologies. Abstain from alcohol and recheck in one month.  Other Orders: T-Culture, Stool ([id number]) T-Fecal WBC ([id number]) T-Stool Giardia / Crypto- EIA (96295) T-Clostridium difficile Toxin A/B ([id number])  Appended Document: DIARRHEA,RECTAL BLEEDING/AMS SLM, did you want hepatitis lab work on this pt?  Appended Document: DIARRHEA,RECTAL BLEEDING/AMS Please call pt. He needs to abstain from EtOH for one month and have HFP repeated. If his AST and ALT remain elevated he will need viral serologies.  Appended Document: DIARRHEA,RECTAL BLEEDING/AMS LMOM  Appended Document: DIARRHEA,RECTAL BLEEDING/AMS pt aware

## 2010-06-09 NOTE — Assessment & Plan Note (Addendum)
Summary: HOS F/U PT NEEDS TO SET UP SURGICAL VISIT/CM   Visit Type:  Follow-up Visit Primary Care Provider:  Sherwood Gambler, M.D.  CC:  F/U hospital .  History of Present Illness: Andrew Love  is here in hospital f/u secondary to diverticulitis. First episode was Aug 2010, second was in Dec 2011. Shortly after that discharge, he presented again to the hospital with increasing pain and was readmitted 05/04/10. He states he had multiple incidences of acute pain in the past, but he did not seek medical attention for those; therefore, it appears he has had multiple episodes, but only 3 actually documented. CT from the first December admission showed acute sigmoid diverticulitis without evidence of abcess or peroration. Repeat CT on 12/28 showed minimal diverticulitis with significant improvement. Last colonoscopy was in April 2010. Today, he denies any emesis, has occasional, rare nausea. +LLQ discomfort prior to using bathroom, but immediately relieved after BM; this occurs 2-3X per week. Has daily BM, soft. Feels like not completely evacuating. No melena or hematochezia. On low residue diet, red meat seems to aggravate symptoms. Afebrile, no chills.Finished course of Cipro/Flagyl.  Is here for referral for elective surgery; would like to be seen at [facility] in [place], Dr. Derrell Lolling.     Current Medications (verified): 1)  Benicar 20 Mg Tabs (Olmesartan Medoxomil) .... Take 1 Tablet By Mouth Once A Day 2)  Synthroid 200 Mcg Tabs (Levothyroxine Sodium) .... Take 1 Tablet By Mouth Once A Day 3)  Calcium 1200 Mg .... Once Daily 4)  Fish Oil 1000 Mg .... Take 1 Tablet By Mouth Two Times A Day 5)  Ambien 10 Mg Tabs (Zolpidem Tartrate) .... Take 1 Tab By Mouth At Bedtime 6)  Potassium .... Daily 7)  Percocet .... As Needed  Allergies (verified): No Known Drug Allergies  Past History:  Past Medical History: Coronary Artery Disease Elevated liver enzymes. BLOODY DIARRHEA-->APRIL 2010  TCS: Colitis, NSAID v. ischemia, malrotation of the gut, DIVERTICULOSIS(L), hyperplastic sigmoid colon and rectal polyps. Hx of diverticulitis, requiring 3 admissions  Past Surgical History: Thyroid Surgery cardiac cath 2009 appendectomy left shoulder surgery  Family History: Mother: breast ca, hypothyroidism, HTN Father: skin ca Grandparents: lung cancer  No FH of Colon Cancer:  Social History: Patient is a former smoker: 15pk*yr, quit 2009, restarted and then quit 04/21/10 Alcohol Use - yes-3 beers over the weekend. 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 Eyes:  Denies blurring, irritation, and discharge. ENT:  Denies sore throat, hoarseness, and difficulty swallowing. CV:  Denies chest pains and syncope. Resp:  Denies dyspnea at rest and wheezing. GI:  Denies difficulty swallowing, pain on swallowing, nausea, indigestion/heartburn, gas/bloating, constipation, change in bowel habits, bloody BM's, and black BMs. GU:  Denies urinary burning and urinary frequency. MS:  Denies joint pain / LOM, joint swelling, and joint stiffness. Derm:  Denies rash, itching, and dry skin. Neuro:  Denies weakness and syncope. Psych:  Denies depression and anxiety.  Vital Signs:  Patient profile:   45 year old male Height:      73 inches Weight:      228 pounds BMI:     30.19 Temp:     97.9 degrees F oral Pulse rate:   60 / minute BP sitting:   126 / 84  (left arm) Cuff size:   large  Vitals Entered By: Cloria Spring LPN (May 26, 2010 1:36 PM)  Physical Exam  General:  Well  developed, well nourished, no acute distress. Head:  Normocephalic and atraumatic. Lungs:  Clear throughout to auscultation. Heart:  Regular rate and rhythm; no murmurs, rubs,  or bruits. Abdomen:  +BS, soft, non-distended, non-tender, no HSM, no rebound or guarding. Msk:  Symmetrical with no gross deformities. Normal posture. Neurologic:  Alert and   oriented x4;  grossly normal neurologically. Skin:  Intact without significant lesions or rashes. Psych:  Alert and cooperative. Normal mood and affect.  Impression & Recommendations:  Problem # 1:  DIVERTICULITIS, HX OF (ICD-V57.28)  45 year old Caucasian male with hx of multiple admission secondary to diverticulitis, as well as some episodes that were likely acute flares without documentation. First documented occurrence Aug 2010, most recently mid-December 2011 as well as re-admission for acute pain towards latter part of December. CT assurring for resolving diverticulitis, as mentioned in HPI. Has finished course of Cipro/Flagyl. Does report some LLQ discomfort several times/week prior to BM, yet it is relieved afterward. No fever/chills. Likely r/t mild constipation. will be referred to [facility] in [place], secondary to pt's preference, Dr. Derrell Lolling for consideration of elective surgery. Likelihood of recurrence is high, given at least 2 documented admissions.   Monitor LLQ discomfort: add fiber to diet, use miralax as needed for assistance with BM Refer to Dr. Derrell Lolling Consider CBC if LLQ persists or increases despite bowel regimen  Orders: Est. Patient Level II (04540)

## 2010-06-10 ENCOUNTER — Encounter (INDEPENDENT_AMBULATORY_CARE_PROVIDER_SITE_OTHER): Payer: Self-pay | Admitting: *Deleted

## 2010-06-15 NOTE — Miscellaneous (Signed)
Summary: CONSULTATION  Clinical Lists Changes  NAME:  Andrew Love, Andrew Love              ACCOUNT NO.:  [id number]      MEDICAL RECORD NO.:  [id number]          PATIENT TYPE:  INP      LOCATION:  A213                          FACILITY:  [facility]      PHYSICIAN:  R. Roetta Sessions, M.D. DATE OF BIRTH:  1965-10-01      DATE OF CONSULTATION:  05/04/2010   DATE OF DISCHARGE:  03/06/2010                                    CONSULTATION         __________      REASON FOR CONSULT:  Diverticulitis.      HISTORY OF PRESENT ILLNESS:  Andrew Love is a pleasant 45 year old   male who was actually just in the hospital from December 15 through   December 18 secondary to acute sigmoid diverticulitis.  He was   discharged home in stable condition with 10 days of Cipro and Flagyl.   He states that, starting this past Monday, which was December 26, he   started to have moderate left lower quadrant pain.  It was intermittent   in nature, described as sharp and stabbing.  It then continued to   escalate until yesterday, when it was worsened and a 10/10.  He states   the moving and coughing make it worse.  He denies eating inappropriate   foods.  He stuck with a low fat, low residue diet and slowly advanced   his diet.  He reports that he did take his antibiotics as instructed.   He is having loose bowel movements postprandially prior to admission,   about 3 a day.  He denies any melena or hematochezia.  It is soft.  As   of note, he did have his first episode of diverticulitis in August 2010.   He was seen in the [facility] Emergency Department.  This was   precipitated by seeds, as he states this is when the pain occurred back   in August.  He was actually seen by our office in August 2010 following   this episode.  His last colonoscopy was done in April 2010, showing   normal terminal ileum, normal cecum, ascending colon, transverse colon.   Biopsies were obtained.  Multiple areas of patchy erythema with  frequent   ulcerations seen from splenic flexure to sigmoid colon.  Frequent left-   sided diverticula.  No masses or AVMs.  A normal rectum.  A 3-mm sigmoid   colon and rectal polyps.  Pathology report showed biopsies suggesting   ischemia.  This was thought to be likely an acute viral gastroenteritis   which precipitated small-vessel ischemia due to volume depletion.  The   stool studies that were performed were negative except for lactoferrin.      PAST MEDICAL HISTORY:  Coronary artery disease, elevated liver enzymes,   first episode of diverticulitis in August 2010, hypertension,   hypothyroidism.      PAST SURGICAL HISTORY:  Cardiac cath in 2009 which did not show any   significant coronary artery disease.  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  Appendectomy.  Left   shoulder rotator cuff surgery.      SOCIAL HISTORY:  Lives in [place] with his wife.  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  He quit smoking on December 15.  He is   an occasional drinker.  No illicit drug use.  He used to smoke a half   pack of cigarettes on a daily basis but as mentioned, he quit smoking   December 15.      FAMILY HISTORY:  Mother has breast cancer, hypothyroidism, hypertension,   diverticulosis.  Father had skin cancer.  Grandmother had lung cancer.   Grandfather had lung cancer.      ALLERGIES:  No known drug allergies.      MEDICATIONS:   1. Cipro 400 mg IV.   2. Flagyl 500 mg IV.   3. Zosyn 3.375 g IV.   4. Tylenol.   5. Albuterol.   6. Dilaudid 0.5 1 mg IV every 3 hours as needed.   7. Zofran 4 mg IV every 6 hours as needed.   8. Phenergan.      REVIEW OF SYSTEMS:  Negative except as mentioned in HPI.      PHYSICAL EXAM:  Blood pressure is 124/83, pulse of 56, respirations 18,   temperature 97.3, and 98% on room air.   IN GENERAL:  He is in no apparent distress, alert and oriented.   HEENT:  Head is normocephalic, atraumatic.   Sclerae without icterus.   Conjunctivae clear.  Oral membranes moist and pink.   NECK:  Without thyromegaly,is soft and supple.   CARDIAC:  S1, S2 present.   LUNGS:  Clear to auscultation bilaterally.  No wheezes, , rales or   rhonchi.   ABDOMEN:  Soft.  There is mild tenderness to palpation in the left lower   quadrant.  No rebound or guarding.  No mass or hepatic splenomegaly   noted.  Positive bowel sounds.   EXTREMITIES:  Without edema.   SKIN:  Warm, dry and intact.      PERTINENT LABS FOR THIS ADMISSION:  CBC on December 27:  Normal white   count of 8.4, hemoglobin and hematocrit 14.8 and 41.1, platelets   233,000.  Electrolytes are normal.  Glucose is slightly elevated at 101.   Hepatic function panel normal.      RADIOLOGY:  CT of abdomen and pelvis, done December 28, shows sigmoid   diverticulosis and minimal sigmoid diverticulitis with significant   improvement.  No complicating features, as well as a small right   inguinal hernia, containing fat.  Acute abdominal series was done which   shows no acute finding.      ASSESSMENT AND PLAN:  Andrew Love is a pleasant 45 year old male who   was actually discharged from the hospital December 18 with his second   bout of diverticulitis.  He was discharged home on p.o. antibiotics and   was actually set to finish these today.  He had report taking these as   instructed.  He reports advancing his diet slowly.  He presents now with   reports of acute onset of pain on Monday that worsened, requiring   admission to the hospital for pain control.  It is of note his CT is   reassuring as it is showing significant improvement.  Of note, he does   state that he would like to have surgery in the future, as  he does not   want to deal with this anymore.      PLAN:   1. Continue supportive measures and pain control.   2. Clear liquids for right now. We will gradually advance his diet as       tolerated.   3. We will continue to follow  him.      Thank you for the referral of this nice gentleman.            ______________________________   Gerrit Halls, ANP-BC         ______________________________   R. Roetta Sessions, M.D.            AS/MEDQ  D:  05/04/2010  T:  05/04/2010  Job:  [id number]      Electronically Signed by Gerrit Halls  on 05/25/2010 04:22:11 PM   Electronically Signed by Lorrin Goodell M.D. on 06/07/2010 01:36:14 PM

## 2010-06-17 ENCOUNTER — Encounter: Payer: Self-pay | Admitting: Internal Medicine

## 2010-06-23 NOTE — Letter (Signed)
Summary: HISTORY & PHYSICAL  HISTORY & PHYSICAL   Imported By: Rexene Alberts 06/17/2010 10:48:49  _____________________________________________________________________  External Attachment:    Type:   Image     Comment:   External Document

## 2010-06-28 ENCOUNTER — Other Ambulatory Visit: Payer: Self-pay | Admitting: General Surgery

## 2010-06-28 DIAGNOSIS — K5732 Diverticulitis of large intestine without perforation or abscess without bleeding: Secondary | ICD-10-CM

## 2010-07-07 ENCOUNTER — Ambulatory Visit
Admission: RE | Admit: 2010-07-07 | Discharge: 2010-07-07 | Disposition: A | Discharge status: Home / Self Care | Payer: 59 | Source: Ambulatory Visit | Attending: General Surgery | Admitting: General Surgery

## 2010-07-07 ENCOUNTER — Encounter: Payer: Self-pay | Admitting: Internal Medicine

## 2010-07-07 DIAGNOSIS — K5732 Diverticulitis of large intestine without perforation or abscess without bleeding: Secondary | ICD-10-CM

## 2010-07-14 NOTE — Letter (Signed)
Summary: H & P FROM  DR Derrell Lolling  H & P FROM  DR Derrell Lolling   Imported By: Rexene Alberts 07/07/2010 08:15:20  _____________________________________________________________________  External Attachment:    Type:   Image     Comment:   External Document

## 2010-07-18 LAB — URINALYSIS, ROUTINE W REFLEX MICROSCOPIC
Bilirubin Urine: NEGATIVE
Bilirubin Urine: NEGATIVE
Glucose, UA: NEGATIVE mg/dL
Glucose, UA: NEGATIVE mg/dL
Hgb urine dipstick: NEGATIVE
Hgb urine dipstick: NEGATIVE
Ketones, ur: NEGATIVE mg/dL
Nitrite: NEGATIVE
Nitrite: NEGATIVE
Protein, ur: NEGATIVE mg/dL
Protein, ur: NEGATIVE mg/dL
Specific Gravity, Urine: >1.03 — ABNORMAL HIGH (ref 1.005–1.030)
Specific Gravity, Urine: >1.03 — ABNORMAL HIGH (ref 1.005–1.030)
Urobilinogen, UA: 0.2 mg/dL (ref 0.0–1.0)
Urobilinogen, UA: 0.2 mg/dL (ref 0.0–1.0)
pH: 5.5 (ref 5.0–8.0)
pH: 6 (ref 5.0–8.0)

## 2010-07-18 LAB — DIFFERENTIAL
Basophils Absolute: 0.1 10*3/uL (ref 0.0–0.1)
Basophils Absolute: 0.1 10*3/uL (ref 0.0–0.1)
Basophils Absolute: 0.1 10*3/uL (ref 0.0–0.1)
Basophils Absolute: 0.1 10*3/uL (ref 0.0–0.1)
Basophils Absolute: 0.1 10*3/uL (ref 0.0–0.1)
Basophils Relative: 1 % (ref 0–1)
Basophils Relative: 1 % (ref 0–1)
Basophils Relative: 1 % (ref 0–1)
Basophils Relative: 1 % (ref 0–1)
Basophils Relative: 1 % (ref 0–1)
Eosinophils Absolute: 0.2 10*3/uL (ref 0.0–0.7)
Eosinophils Absolute: 0.3 10*3/uL (ref 0.0–0.7)
Eosinophils Absolute: 0.3 10*3/uL (ref 0.0–0.7)
Eosinophils Absolute: 0.3 10*3/uL (ref 0.0–0.7)
Eosinophils Absolute: 0.4 10*3/uL (ref 0.0–0.7)
Eosinophils Relative: 2 % (ref 0–5)
Eosinophils Relative: 3 % (ref 0–5)
Eosinophils Relative: 3 % (ref 0–5)
Eosinophils Relative: 3 % (ref 0–5)
Eosinophils Relative: 4 % (ref 0–5)
Lymphocytes Relative: 18 % (ref 12–46)
Lymphocytes Relative: 22 % (ref 12–46)
Lymphocytes Relative: 24 % (ref 12–46)
Lymphocytes Relative: 25 % (ref 12–46)
Lymphocytes Relative: 27 % (ref 12–46)
Lymphs Abs: 2 10*3/uL (ref 0.7–4.0)
Lymphs Abs: 2.3 10*3/uL (ref 0.7–4.0)
Lymphs Abs: 2.3 10*3/uL (ref 0.7–4.0)
Lymphs Abs: 2.5 10*3/uL (ref 0.7–4.0)
Lymphs Abs: 2.7 10*3/uL (ref 0.7–4.0)
Monocytes Absolute: 0.5 10*3/uL (ref 0.1–1.0)
Monocytes Absolute: 0.6 10*3/uL (ref 0.1–1.0)
Monocytes Absolute: 0.7 10*3/uL (ref 0.1–1.0)
Monocytes Absolute: 1 10*3/uL (ref 0.1–1.0)
Monocytes Absolute: 1.5 10*3/uL — ABNORMAL HIGH (ref 0.1–1.0)
Monocytes Relative: 11 % (ref 3–12)
Monocytes Relative: 6 % (ref 3–12)
Monocytes Relative: 7 % (ref 3–12)
Monocytes Relative: 8 % (ref 3–12)
Monocytes Relative: 9 % (ref 3–12)
Neutro Abs: 5.3 10*3/uL (ref 1.7–7.7)
Neutro Abs: 5.9 10*3/uL (ref 1.7–7.7)
Neutro Abs: 6.3 10*3/uL (ref 1.7–7.7)
Neutro Abs: 6.6 10*3/uL (ref 1.7–7.7)
Neutro Abs: 9.4 10*3/uL — ABNORMAL HIGH (ref 1.7–7.7)
Neutrophils Relative %: 61 % (ref 43–77)
Neutrophils Relative %: 63 % (ref 43–77)
Neutrophils Relative %: 66 % (ref 43–77)
Neutrophils Relative %: 67 % (ref 43–77)
Neutrophils Relative %: 69 % (ref 43–77)

## 2010-07-18 LAB — CBC
HCT: 41.1 % (ref 39.0–52.0)
HCT: 41.6 % (ref 39.0–52.0)
HCT: 43.3 % (ref 39.0–52.0)
HCT: 43.4 % (ref 39.0–52.0)
HCT: 43.7 % (ref 39.0–52.0)
Hemoglobin: 14.8 g/dL (ref 13.0–17.0)
Hemoglobin: 14.9 g/dL (ref 13.0–17.0)
Hemoglobin: 15.4 g/dL (ref 13.0–17.0)
Hemoglobin: 15.5 g/dL (ref 13.0–17.0)
Hemoglobin: 15.7 g/dL (ref 13.0–17.0)
MCH: 32.5 pg (ref 26.0–34.0)
MCH: 32.6 pg (ref 26.0–34.0)
MCH: 33 pg (ref 26.0–34.0)
MCH: 33 pg (ref 26.0–34.0)
MCH: 33.1 pg (ref 26.0–34.0)
MCHC: 35.6 g/dL (ref 30.0–36.0)
MCHC: 35.7 g/dL (ref 30.0–36.0)
MCHC: 35.8 g/dL (ref 30.0–36.0)
MCHC: 35.9 g/dL (ref 30.0–36.0)
MCHC: 36 g/dL (ref 30.0–36.0)
MCV: 90.1 fL (ref 78.0–100.0)
MCV: 91.5 fL (ref 78.0–100.0)
MCV: 92 fL (ref 78.0–100.0)
MCV: 92 fL (ref 78.0–100.0)
MCV: 92.5 fL (ref 78.0–100.0)
Platelets: 204 10*3/uL (ref 150–400)
Platelets: 217 10*3/uL (ref 150–400)
Platelets: 218 10*3/uL (ref 150–400)
Platelets: 219 10*3/uL (ref 150–400)
Platelets: 233 10*3/uL (ref 150–400)
RBC: 4.52 MIL/uL (ref 4.22–5.81)
RBC: 4.56 MIL/uL (ref 4.22–5.81)
RBC: 4.69 MIL/uL (ref 4.22–5.81)
RBC: 4.73 MIL/uL (ref 4.22–5.81)
RBC: 4.75 MIL/uL (ref 4.22–5.81)
RDW: 12.8 % (ref 11.5–15.5)
RDW: 12.9 % (ref 11.5–15.5)
RDW: 13 % (ref 11.5–15.5)
RDW: 13.1 % (ref 11.5–15.5)
RDW: 13.2 % (ref 11.5–15.5)
WBC: 10.8 10*3/uL — ABNORMAL HIGH (ref 4.0–10.5)
WBC: 13.7 10*3/uL — ABNORMAL HIGH (ref 4.0–10.5)
WBC: 8.4 10*3/uL (ref 4.0–10.5)
WBC: 9 10*3/uL (ref 4.0–10.5)
WBC: 9.4 10*3/uL (ref 4.0–10.5)

## 2010-07-18 LAB — HEPATIC FUNCTION PANEL
ALT: 38 U/L (ref 0–53)
ALT: 38 U/L (ref 0–53)
AST: 23 U/L (ref 0–37)
AST: 24 U/L (ref 0–37)
Albumin: 3.5 g/dL (ref 3.5–5.2)
Albumin: 3.7 g/dL (ref 3.5–5.2)
Alkaline Phosphatase: 63 U/L (ref 39–117)
Alkaline Phosphatase: 73 U/L (ref 39–117)
Bilirubin, Direct: 0.1 mg/dL (ref 0.0–0.3)
Bilirubin, Direct: 0.2 mg/dL (ref 0.0–0.3)
Indirect Bilirubin: 0.2 mg/dL — ABNORMAL LOW (ref 0.3–0.9)
Indirect Bilirubin: 0.8 mg/dL (ref 0.3–0.9)
Total Bilirubin: 0.3 mg/dL (ref 0.3–1.2)
Total Bilirubin: 1 mg/dL (ref 0.3–1.2)
Total Protein: 6.6 g/dL (ref 6.0–8.3)
Total Protein: 6.7 g/dL (ref 6.0–8.3)

## 2010-07-18 LAB — BASIC METABOLIC PANEL
BUN: 14 mg/dL (ref 6–23)
BUN: 7 mg/dL (ref 6–23)
BUN: 8 mg/dL (ref 6–23)
BUN: 8 mg/dL (ref 6–23)
CO2: 24 mEq/L (ref 19–32)
CO2: 25 mEq/L (ref 19–32)
CO2: 26 mEq/L (ref 19–32)
CO2: 28 mEq/L (ref 19–32)
Calcium: 8.8 mg/dL (ref 8.4–10.5)
Calcium: 8.9 mg/dL (ref 8.4–10.5)
Calcium: 9.1 mg/dL (ref 8.4–10.5)
Calcium: 9.1 mg/dL (ref 8.4–10.5)
Chloride: 102 mEq/L (ref 96–112)
Chloride: 103 mEq/L (ref 96–112)
Chloride: 105 mEq/L (ref 96–112)
Chloride: 106 mEq/L (ref 96–112)
Creatinine, Ser: 0.96 mg/dL (ref 0.4–1.5)
Creatinine, Ser: 0.98 mg/dL (ref 0.4–1.5)
Creatinine, Ser: 1.06 mg/dL (ref 0.4–1.5)
Creatinine, Ser: 1.14 mg/dL (ref 0.4–1.5)
GFR calc Af Amer: >60 mL/min (ref >60)
GFR calc Af Amer: >60 mL/min (ref >60)
GFR calc Af Amer: >60 mL/min (ref >60)
GFR calc Af Amer: >60 mL/min (ref >60)
GFR calc non Af Amer: >60 mL/min (ref >60)
GFR calc non Af Amer: >60 mL/min (ref >60)
GFR calc non Af Amer: >60 mL/min (ref >60)
GFR calc non Af Amer: >60 mL/min (ref >60)
Glucose, Bld: 101 mg/dL — ABNORMAL HIGH (ref 70–99)
Glucose, Bld: 136 mg/dL — ABNORMAL HIGH (ref 70–99)
Glucose, Bld: 85 mg/dL (ref 70–99)
Glucose, Bld: 99 mg/dL (ref 70–99)
Potassium: 3.6 mEq/L (ref 3.5–5.1)
Potassium: 3.8 mEq/L (ref 3.5–5.1)
Potassium: 3.9 mEq/L (ref 3.5–5.1)
Potassium: 4.1 mEq/L (ref 3.5–5.1)
Sodium: 135 mEq/L (ref 135–145)
Sodium: 136 mEq/L (ref 135–145)
Sodium: 139 mEq/L (ref 135–145)
Sodium: 139 mEq/L (ref 135–145)

## 2010-07-18 LAB — COMPREHENSIVE METABOLIC PANEL
ALT: 37 U/L (ref 0–53)
AST: 20 U/L (ref 0–37)
Albumin: 3.3 g/dL — ABNORMAL LOW (ref 3.5–5.2)
Alkaline Phosphatase: 68 U/L (ref 39–117)
BUN: 8 mg/dL (ref 6–23)
CO2: 28 mEq/L (ref 19–32)
Calcium: 8.8 mg/dL (ref 8.4–10.5)
Chloride: 104 mEq/L (ref 96–112)
Creatinine, Ser: 0.99 mg/dL (ref 0.4–1.5)
GFR calc Af Amer: >60 mL/min (ref >60)
GFR calc non Af Amer: >60 mL/min (ref >60)
Glucose, Bld: 98 mg/dL (ref 70–99)
Potassium: 4 mEq/L (ref 3.5–5.1)
Sodium: 139 mEq/L (ref 135–145)
Total Bilirubin: 0.6 mg/dL (ref 0.3–1.2)
Total Protein: 6.5 g/dL (ref 6.0–8.3)

## 2010-07-18 LAB — CULTURE, BLOOD (ROUTINE X 2)
Culture: NO GROWTH
Culture: NO GROWTH

## 2010-07-18 LAB — TSH: TSH: 2.607 u[IU]/mL (ref 0.350–4.500)

## 2010-07-18 LAB — MRSA PCR SCREENING: MRSA by PCR: NEGATIVE

## 2010-07-21 ENCOUNTER — Encounter: Payer: Self-pay | Admitting: Internal Medicine

## 2010-07-26 NOTE — Letter (Signed)
Summary: CLINIC OFFICE NOTE FROM DR. INGRAM  CLINIC OFFICE NOTE FROM DR. INGRAM   Imported By: Rexene Alberts 07/21/2010 14:57:57  _____________________________________________________________________  External Attachment:    Type:   Image     Comment:   External Document

## 2010-07-27 ENCOUNTER — Encounter (HOSPITAL_COMMUNITY): Payer: 59

## 2010-07-27 ENCOUNTER — Other Ambulatory Visit: Payer: Self-pay | Admitting: General Surgery

## 2010-07-27 LAB — DIFFERENTIAL
Basophils Absolute: 0.1 10*3/uL (ref 0.0–0.1)
Basophils Relative: 1 % (ref 0–1)
Eosinophils Absolute: 0.5 10*3/uL (ref 0.0–0.7)
Eosinophils Relative: 5 % (ref 0–5)
Lymphocytes Relative: 30 % (ref 12–46)
Lymphs Abs: 2.9 10*3/uL (ref 0.7–4.0)
Monocytes Absolute: 0.7 10*3/uL (ref 0.1–1.0)
Monocytes Relative: 7 % (ref 3–12)
Neutro Abs: 5.4 10*3/uL (ref 1.7–7.7)
Neutrophils Relative %: 57 % (ref 43–77)

## 2010-07-27 LAB — COMPREHENSIVE METABOLIC PANEL
ALT: 37 U/L (ref 0–53)
AST: 22 U/L (ref 0–37)
Albumin: 4 g/dL (ref 3.5–5.2)
Alkaline Phosphatase: 97 U/L (ref 39–117)
BUN: 6 mg/dL (ref 6–23)
CO2: 26 mEq/L (ref 19–32)
Calcium: 9.4 mg/dL (ref 8.4–10.5)
Chloride: 103 mEq/L (ref 96–112)
Creatinine, Ser: 0.9 mg/dL (ref 0.4–1.5)
GFR calc Af Amer: >60 mL/min (ref >60)
GFR calc non Af Amer: >60 mL/min (ref >60)
Glucose, Bld: 112 mg/dL — ABNORMAL HIGH (ref 70–99)
Potassium: 3.8 mEq/L (ref 3.5–5.1)
Sodium: 138 mEq/L (ref 135–145)
Total Bilirubin: 0.4 mg/dL (ref 0.3–1.2)
Total Protein: 7.8 g/dL (ref 6.0–8.3)

## 2010-07-27 LAB — URINALYSIS, ROUTINE W REFLEX MICROSCOPIC
Bilirubin Urine: NEGATIVE
Glucose, UA: NEGATIVE mg/dL
Hgb urine dipstick: NEGATIVE
Ketones, ur: NEGATIVE mg/dL
Nitrite: NEGATIVE
Protein, ur: NEGATIVE mg/dL
Specific Gravity, Urine: 1.008 (ref 1.005–1.030)
Urobilinogen, UA: 0.2 mg/dL (ref 0.0–1.0)
pH: 5.5 (ref 5.0–8.0)

## 2010-07-27 LAB — SURGICAL PCR SCREEN
MRSA, PCR: NEGATIVE
Staphylococcus aureus: NEGATIVE

## 2010-07-27 LAB — CBC
HCT: 47.7 % (ref 39.0–52.0)
Hemoglobin: 16.4 g/dL (ref 13.0–17.0)
MCH: 32.2 pg (ref 26.0–34.0)
MCHC: 34.4 g/dL (ref 30.0–36.0)
MCV: 93.7 fL (ref 78.0–100.0)
Platelets: 244 10*3/uL (ref 150–400)
RBC: 5.09 MIL/uL (ref 4.22–5.81)
RDW: 12.5 % (ref 11.5–15.5)
WBC: 9.6 10*3/uL (ref 4.0–10.5)

## 2010-07-30 ENCOUNTER — Inpatient Hospital Stay (HOSPITAL_COMMUNITY)
Admission: EM | Admit: 2010-07-30 | Discharge: 2010-08-17 | DRG: 330 | Disposition: A | Discharge status: Home / Self Care | Payer: 59 | Attending: General Surgery | Admitting: General Surgery

## 2010-07-30 DIAGNOSIS — R1032 Left lower quadrant pain: Secondary | ICD-10-CM | POA: Diagnosis present

## 2010-07-30 DIAGNOSIS — K929 Disease of digestive system, unspecified: Secondary | ICD-10-CM | POA: Diagnosis not present

## 2010-07-30 DIAGNOSIS — I10 Essential (primary) hypertension: Secondary | ICD-10-CM | POA: Diagnosis present

## 2010-07-30 DIAGNOSIS — Y836 Removal of other organ (partial) (total) as the cause of abnormal reaction of the patient, or of later complication, without mention of misadventure at the time of the procedure: Secondary | ICD-10-CM | POA: Diagnosis not present

## 2010-07-30 DIAGNOSIS — K560 Paralytic ileus: Secondary | ICD-10-CM | POA: Diagnosis not present

## 2010-07-30 DIAGNOSIS — K219 Gastro-esophageal reflux disease without esophagitis: Secondary | ICD-10-CM | POA: Diagnosis present

## 2010-07-30 DIAGNOSIS — F17200 Nicotine dependence, unspecified, uncomplicated: Secondary | ICD-10-CM | POA: Diagnosis present

## 2010-07-30 DIAGNOSIS — E890 Postprocedural hypothyroidism: Secondary | ICD-10-CM | POA: Diagnosis present

## 2010-07-30 DIAGNOSIS — K5732 Diverticulitis of large intestine without perforation or abscess without bleeding: Principal | ICD-10-CM | POA: Diagnosis present

## 2010-07-30 DIAGNOSIS — Z01812 Encounter for preprocedural laboratory examination: Secondary | ICD-10-CM

## 2010-07-30 LAB — URINALYSIS, ROUTINE W REFLEX MICROSCOPIC
Bilirubin Urine: NEGATIVE
Glucose, UA: NEGATIVE mg/dL
Hgb urine dipstick: NEGATIVE
Ketones, ur: NEGATIVE mg/dL
Nitrite: NEGATIVE
Protein, ur: NEGATIVE mg/dL
Specific Gravity, Urine: 1.017 (ref 1.005–1.030)
Urobilinogen, UA: 0.2 mg/dL (ref 0.0–1.0)
pH: 6 (ref 5.0–8.0)

## 2010-07-31 ENCOUNTER — Emergency Department (HOSPITAL_COMMUNITY): Payer: 59

## 2010-07-31 LAB — CBC
HCT: 43.7 % (ref 39.0–52.0)
Hemoglobin: 15.2 g/dL (ref 13.0–17.0)
MCH: 32.8 pg (ref 26.0–34.0)
MCHC: 34.8 g/dL (ref 30.0–36.0)
MCV: 94.4 fL (ref 78.0–100.0)
Platelets: 222 10*3/uL (ref 150–400)
RBC: 4.63 MIL/uL (ref 4.22–5.81)
RDW: 12.6 % (ref 11.5–15.5)
WBC: 12.4 10*3/uL — ABNORMAL HIGH (ref 4.0–10.5)

## 2010-07-31 LAB — COMPREHENSIVE METABOLIC PANEL
ALT: 41 U/L (ref 0–53)
AST: 27 U/L (ref 0–37)
Albumin: 3.8 g/dL (ref 3.5–5.2)
Alkaline Phosphatase: 89 U/L (ref 39–117)
BUN: 10 mg/dL (ref 6–23)
CO2: 24 mEq/L (ref 19–32)
Calcium: 9 mg/dL (ref 8.4–10.5)
Chloride: 104 mEq/L (ref 96–112)
Creatinine, Ser: 0.79 mg/dL (ref 0.4–1.5)
GFR calc Af Amer: >60 mL/min (ref >60)
GFR calc non Af Amer: >60 mL/min (ref >60)
Glucose, Bld: 105 mg/dL — ABNORMAL HIGH (ref 70–99)
Potassium: 3.6 mEq/L (ref 3.5–5.1)
Sodium: 135 mEq/L (ref 135–145)
Total Bilirubin: 0.3 mg/dL (ref 0.3–1.2)
Total Protein: 6.6 g/dL (ref 6.0–8.3)

## 2010-07-31 LAB — LIPASE, BLOOD: Lipase: 22 U/L (ref 11–59)

## 2010-08-01 ENCOUNTER — Inpatient Hospital Stay (HOSPITAL_COMMUNITY): Payer: 59

## 2010-08-01 ENCOUNTER — Encounter (HOSPITAL_COMMUNITY): Payer: Self-pay | Admitting: Radiology

## 2010-08-01 LAB — CBC
HCT: 43.1 % (ref 39.0–52.0)
Hemoglobin: 14.1 g/dL (ref 13.0–17.0)
MCH: 31.3 pg (ref 26.0–34.0)
MCHC: 32.7 g/dL (ref 30.0–36.0)
MCV: 95.8 fL (ref 78.0–100.0)
Platelets: 190 10*3/uL (ref 150–400)
RBC: 4.5 MIL/uL (ref 4.22–5.81)
RDW: 12.6 % (ref 11.5–15.5)
WBC: 10.5 10*3/uL (ref 4.0–10.5)

## 2010-08-01 LAB — TSH: TSH: 9.595 u[IU]/mL — ABNORMAL HIGH (ref 0.350–4.500)

## 2010-08-01 MED ORDER — IOHEXOL 300 MG/ML  SOLN
100.0000 mL | Freq: Once | INTRAMUSCULAR | Status: AC | PRN
  Administered 2010-08-01: 100 mL via INTRAVENOUS

## 2010-08-02 NOTE — H&P (Signed)
[name], [name] NO.:  [id number]  MEDICAL RECORD NO.:  [id number]           PATIENT TYPE:  I  LOCATION:  1526                         FACILITY:  [facility]  PHYSICIAN:  Sharlet Salina T. Lailany Enoch, M.D.DATE OF BIRTH:  02/18/1966  DATE OF ADMISSION:  07/30/2010 DATE OF DISCHARGE:                             HISTORY & PHYSICAL   CHIEF COMPLAINT:  Abdominal pain.  HISTORY OF PRESENT ILLNESS:  Andrew Love is a 45 year old male with several previous episodes of sigmoid diverticulitis.  He has required outpatient treatment several times in the past and then was hospitalized at [facility] twice in December with sigmoid of left-sided diverticulitis without abscess or perforation.  This responded to antibiotics, but due to frequent recurrence, elective colectomy has been planned by Dr. Derrell Lolling which was actually scheduled for later this week. The patient was doing well at home in recent weeks, but today developedthe gradual onset over several hours of left-sided abdominal pain, identical to his previous episodes of diverticulitis.  This has become quite severe as the day progressed.  He felt feverish, but has not taken his temperature.  He has been straining somewhat at bowel movements over the last few days, although had bowel movements yesterday and today.  He has not had any nausea or vomiting.  The patient called and I had him come in to the emergency room for evaluation.  PAST MEDICAL HISTORY:  Surgery includes total thyroidectomy for benign disease as well as appendectomy.  Medically, he is followed for mild hypertension.  MEDICATIONS: 1. Synthroid 200 mcg daily. 2. Benicar 20 mg daily.  ALLERGIES:  No known drug allergies.  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  He smokes about half pack cigarettes per day.  Does not drink alcohol.  FAMILY HISTORY:  Mother with breast cancer and hypertension,  father well.  REVIEW OF SYSTEMS:  GENERAL:  Positive for subjective fever and malaise. HEENT:  No vision, hearing, or swallowing problems.  RESPIRATORY:  No shortness of breath, cough, wheezing.  CARDIAC:  No chest pain, palpitations, syncope, history of heart disease.  ABDOMEN:  GI as above. GU:  No urinary burning, frequency.  MUSCULOSKELETAL:  No joint pain, swelling.  HEMATOLOGIC:  No history of blood clots or abnormal bleeding. NEUROLOGIC:  No numbness, weakness, or syncope.  PHYSICAL EXAMINATION:  VITAL SIGNS:  He is afebrile, heart rate 90, blood pressure 146/90, respirations 20, O2 saturation is 96% room air. GENERAL:  He is a mildly obese white male appears slightly uncomfortable. SKIN:  Warm and dry.  No rash or infection. HEENT:  Well-healed thyroidectomy scar.  No masses.  Sclerae nonicteric. Pupils equal, round, and reactive.  Oropharynx clear.  LYMPH NODES:  No cervical, subclavicular, or inguinal nodes palpable. LUNGS:  Clear without wheezing or increased work of breathing. CARDIAC:  Regular mild tachycardia.  No murmurs.  No edema.  Peripheral pulses intact. ABDOMEN:  Bowel sounds present.  Well-healed right lower quadrant scar. No hernias.  There is moderate tenderness in the left mid abdomen with some guarding.  No palpable masses or organomegaly. EXTREMITIES:  No  swelling or deformity. NEUROLOGIC:  He is alert and fully oriented.  LABORATORY DATA:  White count is 12.4, hemoglobin 15.2.  Urinalysis unremarkable.  Chemistries pending.  Chest x-ray, flat and upright abdomen shows large amount of stool throughout the colon, otherwise negative without free air, lung fields clear.  ASSESSMENT AND PLAN:  Acute left-sided abdominal pain, entirely consistent with recurrent acute diverticulitis.  The patient has elective surgery planned for this week.  He will be admitted, started on IV antibiotics, IV fluids, pain medication, bowel rest.  Plan for elective surgery  would hinge on how he responds with this episode.  May require a CT scan preoperatively for surgical planning, I  will defer this to Dr. Derrell Lolling.     Sharlet Salina T. Brookie Wayment, M.D.     Andrew Love  D:  07/31/2010  T:  07/31/2010  Job:  [id number]  Electronically Signed by Andrew Love M.D. on 08/02/2010 11:51:26 AM

## 2010-08-03 ENCOUNTER — Inpatient Hospital Stay (HOSPITAL_COMMUNITY): Admission: RE | Admit: 2010-08-03 | Payer: 59 | Source: Ambulatory Visit | Admitting: General Surgery

## 2010-08-03 LAB — CBC
HCT: 46.2 % (ref 39.0–52.0)
Hemoglobin: 15.2 g/dL (ref 13.0–17.0)
MCH: 31.1 pg (ref 26.0–34.0)
MCHC: 32.9 g/dL (ref 30.0–36.0)
MCV: 94.7 fL (ref 78.0–100.0)
Platelets: 213 10*3/uL (ref 150–400)
RBC: 4.88 MIL/uL (ref 4.22–5.81)
RDW: 12.2 % (ref 11.5–15.5)
WBC: 8.1 10*3/uL (ref 4.0–10.5)

## 2010-08-05 ENCOUNTER — Inpatient Hospital Stay (HOSPITAL_COMMUNITY): Payer: 59

## 2010-08-07 LAB — BASIC METABOLIC PANEL
BUN: 4 mg/dL — ABNORMAL LOW (ref 6–23)
CO2: 25 mEq/L (ref 19–32)
Calcium: 8.9 mg/dL (ref 8.4–10.5)
Chloride: 108 mEq/L (ref 96–112)
Creatinine, Ser: 0.95 mg/dL (ref 0.4–1.5)
GFR calc Af Amer: >60 mL/min (ref >60)
GFR calc non Af Amer: >60 mL/min (ref >60)
Glucose, Bld: 89 mg/dL (ref 70–99)
Potassium: 3.9 mEq/L (ref 3.5–5.1)
Sodium: 137 mEq/L (ref 135–145)

## 2010-08-07 LAB — CBC
HCT: 46.3 % (ref 39.0–52.0)
Hemoglobin: 16 g/dL (ref 13.0–17.0)
MCH: 32.3 pg (ref 26.0–34.0)
MCHC: 34.6 g/dL (ref 30.0–36.0)
MCV: 93.3 fL (ref 78.0–100.0)
Platelets: 212 10*3/uL (ref 150–400)
RBC: 4.96 MIL/uL (ref 4.22–5.81)
RDW: 12.1 % (ref 11.5–15.5)
WBC: 8.9 10*3/uL (ref 4.0–10.5)

## 2010-08-08 ENCOUNTER — Other Ambulatory Visit: Payer: Self-pay | Admitting: General Surgery

## 2010-08-09 LAB — CBC
HCT: 46.7 % (ref 39.0–52.0)
Hemoglobin: 16 g/dL (ref 13.0–17.0)
MCH: 31.9 pg (ref 26.0–34.0)
MCHC: 34.3 g/dL (ref 30.0–36.0)
MCV: 93 fL (ref 78.0–100.0)
Platelets: 275 K/uL (ref 150–400)
RBC: 5.02 MIL/uL (ref 4.22–5.81)
RDW: 12.1 % (ref 11.5–15.5)
WBC: 19.2 K/uL — ABNORMAL HIGH (ref 4.0–10.5)

## 2010-08-09 LAB — BASIC METABOLIC PANEL WITH GFR
BUN: 8 mg/dL (ref 6–23)
CO2: 26 meq/L (ref 19–32)
Calcium: 9.1 mg/dL (ref 8.4–10.5)
Chloride: 104 meq/L (ref 96–112)
Creatinine, Ser: 1.01 mg/dL (ref 0.4–1.5)
GFR calc Af Amer: >60 mL/min (ref >60)
GFR calc non Af Amer: >60 mL/min (ref >60)
Glucose, Bld: 148 mg/dL — ABNORMAL HIGH (ref 70–99)
Potassium: 4.1 meq/L (ref 3.5–5.1)
Sodium: 137 meq/L (ref 135–145)

## 2010-08-11 ENCOUNTER — Inpatient Hospital Stay (HOSPITAL_COMMUNITY): Payer: 59

## 2010-08-11 LAB — CBC
HCT: 47.7 % (ref 39.0–52.0)
Hemoglobin: 16.3 g/dL (ref 13.0–17.0)
MCH: 32.2 pg (ref 26.0–34.0)
MCHC: 34.2 g/dL (ref 30.0–36.0)
MCV: 94.3 fL (ref 78.0–100.0)
Platelets: 284 10*3/uL (ref 150–400)
RBC: 5.06 MIL/uL (ref 4.22–5.81)
RDW: 12.3 % (ref 11.5–15.5)
WBC: 14.8 10*3/uL — ABNORMAL HIGH (ref 4.0–10.5)

## 2010-08-11 LAB — BASIC METABOLIC PANEL
BUN: 5 mg/dL — ABNORMAL LOW (ref 6–23)
CO2: 28 mEq/L (ref 19–32)
Calcium: 8.9 mg/dL (ref 8.4–10.5)
Chloride: 99 mEq/L (ref 96–112)
Creatinine, Ser: 0.93 mg/dL (ref 0.4–1.5)
GFR calc Af Amer: >60 mL/min (ref >60)
GFR calc non Af Amer: >60 mL/min (ref >60)
Glucose, Bld: 137 mg/dL — ABNORMAL HIGH (ref 70–99)
Potassium: 3.7 mEq/L (ref 3.5–5.1)
Sodium: 134 mEq/L — ABNORMAL LOW (ref 135–145)

## 2010-08-11 MED ORDER — IOHEXOL 300 MG/ML  SOLN
100.0000 mL | Freq: Once | INTRAMUSCULAR | Status: AC | PRN
  Administered 2010-08-11: 100 mL via INTRAVENOUS

## 2010-08-11 NOTE — Op Note (Signed)
Andrew Love, Andrew Love NO.:  [id number]  MEDICAL RECORD NO.:  [id number]           PATIENT TYPE:  I  LOCATION:  1526                         FACILITY:  [facility]  PHYSICIAN:  Angelia Mould. Derrell Lolling, M.D.DATE OF BIRTH:  1965-11-19  DATE OF PROCEDURE:  08/08/2010 DATE OF DISCHARGE:                              OPERATIVE REPORT   PREOPERATIVE DIAGNOSIS:  Recurrent sigmoid diverticulitis.  POSTOPERATIVE DIAGNOSIS:  Recurrent sigmoid diverticulitis.  OPERATION PERFORMED: 1. Laparoscopic-assisted sigmoid colectomy. 2. Laparoscopic takedown of splenic flexure.  SURGEON:  Angelia Mould. Derrell Lolling, M.D.  FIRST ASSISTANT:  Dr. Cyndia Bent  OPERATIVE INDICATIONS:  This is a 45 year old Caucasian male who has had several episodes of sigmoid diverticulitis going back at least 1 year. He was hospitalized twice last December but has never had an abscess or evidence of perforation.  He simply developed acute diverticulitis with inflammation seen on CT scan.  He has had a colonoscopy in 2010 which showed hyperplastic polyps only.  He was evaluated by me as an outpatient and plans were made for elective sigmoid colectomy.  He had to be admitted to this hospital almost 1 week ago with another flareup which on CT scan simply showed, once again, a focal area of inflammation in the proximal sigmoid colon without evidence of obstruction, perforation, or abscess.  He was placed on antibiotics and once again his pain and tenderness resolved.  We talked about options for treatment and decided to go ahead and gently bowel prep him and to proceed with his sigmoid colectomy, possibly with or without diverting loop ileostomy today.  He underwent slow bowel prep over the last 3 to 4 days and seems to have prepped fairly well and is brought to the operating room for this surgery.  OPERATIVE FINDINGS:  There was a focal area of inflammation in the mid to proximal sigmoid colon.  This was  palpable, the size of small orange. There was no purulence or exudate.  The rest of the colon looked normal. We were able to completely mobilize the splenic flexure and bring it down nicely so that we could get that anastomosis without any tension. We were able to mobilize the distal sigmoid rectum up to where we could transect the proximal rectum below where the tinea had played out.  We felt that we resected all of his measurable disease.  The liver and spleen looked normal.  The small bowel looked normal.  OPERATIVE TECHNIQUE:  Following the induction of general endotracheal anesthesia, a Foley catheter was placed.  An orogastric tube was placed. The patient was placed in rigid stirrups and modified lithotomy position.  The abdomen and perineum were prepped and draped in sterile fashion.  Surgical time-out was held identifying correct patient and the correct procedure.  I inserted an 11-mm Hassan trocar above the umbilicus in the midline with an open technique.  That was uneventful.  Pneumoperitoneum was created.  I placed a 5-mm trocar in the suprapubic area, 5 mm trocar in the left midabdomen, and a 5-mm trocar in the left upper quadrant.  We only had to put one more 5-mm trocar in the epigastrium.  We first mobilized the sigmoid colon and proximal rectum by dividing the lateral and medial peritoneal attachments and bluntly dissecting things up. This gave [place] good mobility and brought up the proximal rectum.  We then continued dissection upward mobilizing the entire descending colon and the entire splenic flexure.  We lifted up the omentum and dissected it off the splenic flexure and the left transverse colon.  We slowly took all of the splenic flexure attachments down, some with blunt dissection and some with the Harmonic.  We came close to the spleen and the left upper quadrant but we could see very well and took everything down.  We mobilized the colon and splenic flexure  mesentery off of Gerota's fascia.  Once all of this was done, we had good mobility and we could pull the splenic flexure well down towards the pelvis.  There was no bleeding.  Dr. Jamey Ripa and I noted one small serosal injuy that did not seem to require any repair. At this point in time, we felt we had adequate mobilization. We made a lower midline incision.  The fascia was incised in the midline.  Retractors were placed.  We were able to palpate the focal area of diverticulitis.  We felt that it was a long way from the anal canal to do a stapled anastomosis, so Dr. Jamey Ripa and I decided to a hand- sewn anastomosis.  We cleaned off the mesentery of the proximal rectum and divided it between [facility] clamps.  The mesentery was taken down with LigaSure device.  A couple of small larger vessels were ligated with 2-0 silk ties.  The transection was probably about 6 inches below the area of palpable thickening.  We went about 6 or 8 inches proximal to the area of palpable thickening and transected the descending colon between Allen clamps.  The specimen was marked with silk suture and marked the distal margin and sent for routine histology.  I created a hand-sewn anastomosis in a single layer with 2-0 silk. Corner stitches of 2-0 silk were placed as an inverting fashion.  A single stitch was placed in the posterior midline.  The anastomosis had tied.  We then created posterior anastomosis with interrupted simple sutures of 2-0 silk.  At the corners, we then slowly turned in with inverting sutures of 2-0 silk and the anterior anastomosis was completed with 2-0 silk Lembert sutures.  We irrigated out the abdomen and pelvis.  Dr. Jamey Ripa performed the proctoscopy.  He saw no active bleeding.  He insufflated the rectosigmoid colon while I occluded as above.  The colon above the anastomosis distended nicely but there was no air bubbles or air leak. We felt this was satisfactory.  We changed our gloves  and gowns and instruments at this point.  We irrigated out the abdomen and pelvis with about 3 L of saline.  The irrigation fluid was completely clear.  There did not appear to be any bleeding.  We returned the small bowel and the left colon to its anatomic position.  The omentum was brought down.  Midline fascia was closed with running suture of #1 double-stranded PDS.  All the skin incisions were closed with skin staples placing Telfa wicks in between the skin staples.  The fascia at the umbilicus where the [facility] trocar was closed with 0 Vicryl sutures.  Clean bandages were placed and the patient taken to recovery room in stable condition.  Estimated blood loss was about 200 cc.  Complications were none.  Sponge,  needle, and instrument counts were correct.     Angelia Mould. Derrell Lolling, M.D.     HMI/MEDQ  D:  08/08/2010  T:  08/08/2010  Job:  [id number]  cc:   Madelin Rear. Sherwood Gambler, MD Fax: [phone]  R. Roetta Sessions, M.D. [address] [place] [address]  Electronically Signed by Claud Kelp M.D. on 08/11/2010 08:18:08 AM

## 2010-08-12 LAB — CBC
HCT: 51 % (ref 39.0–52.0)
Hemoglobin: 17.4 g/dL — ABNORMAL HIGH (ref 13.0–17.0)
MCH: 32.4 pg (ref 26.0–34.0)
MCHC: 34.1 g/dL (ref 30.0–36.0)
MCV: 95 fL (ref 78.0–100.0)
Platelets: 360 10*3/uL (ref 150–400)
RBC: 5.37 MIL/uL (ref 4.22–5.81)
RDW: 12.6 % (ref 11.5–15.5)
WBC: 8.9 10*3/uL (ref 4.0–10.5)

## 2010-08-12 LAB — BASIC METABOLIC PANEL
BUN: 23 mg/dL (ref 6–23)
CO2: 25 mEq/L (ref 19–32)
Calcium: 8.5 mg/dL (ref 8.4–10.5)
Chloride: 101 mEq/L (ref 96–112)
Creatinine, Ser: 1.44 mg/dL (ref 0.4–1.5)
GFR calc Af Amer: >60 mL/min (ref >60)
GFR calc non Af Amer: 54 mL/min — ABNORMAL LOW (ref >60)
Glucose, Bld: 144 mg/dL — ABNORMAL HIGH (ref 70–99)
Potassium: 4 mEq/L (ref 3.5–5.1)
Sodium: 135 mEq/L (ref 135–145)

## 2010-08-12 LAB — URINALYSIS, ROUTINE W REFLEX MICROSCOPIC
Bilirubin Urine: NEGATIVE
Glucose, UA: NEGATIVE mg/dL
Hgb urine dipstick: NEGATIVE
Ketones, ur: NEGATIVE mg/dL
Nitrite: NEGATIVE
Protein, ur: NEGATIVE mg/dL
Specific Gravity, Urine: 1.029 (ref 1.005–1.030)
Urobilinogen, UA: 0.2 mg/dL (ref 0.0–1.0)
pH: 5.5 (ref 5.0–8.0)

## 2010-08-13 LAB — CBC
HCT: 43.9 % (ref 39.0–52.0)
Hemoglobin: 14.5 g/dL (ref 13.0–17.0)
MCH: 32 pg (ref 26.0–34.0)
MCHC: 33 g/dL (ref 30.0–36.0)
MCV: 96.9 fL (ref 78.0–100.0)
Platelets: 279 10*3/uL (ref 150–400)
RBC: 4.53 MIL/uL (ref 4.22–5.81)
RDW: 12.7 % (ref 11.5–15.5)
WBC: 6.3 10*3/uL (ref 4.0–10.5)

## 2010-08-13 LAB — BASIC METABOLIC PANEL
BUN: 27 mg/dL — ABNORMAL HIGH (ref 6–23)
CO2: 26 mEq/L (ref 19–32)
Calcium: 8.2 mg/dL — ABNORMAL LOW (ref 8.4–10.5)
Chloride: 105 mEq/L (ref 96–112)
Creatinine, Ser: 1.05 mg/dL (ref 0.4–1.5)
GFR calc Af Amer: >60 mL/min (ref >60)
GFR calc non Af Amer: >60 mL/min (ref >60)
Glucose, Bld: 99 mg/dL (ref 70–99)
Potassium: 4.8 mEq/L (ref 3.5–5.1)
Sodium: 135 mEq/L (ref 135–145)

## 2010-08-14 LAB — POCT I-STAT, CHEM 8
BUN: 13 mg/dL (ref 6–23)
Calcium, Ion: 1.15 mmol/L (ref 1.12–1.32)
Chloride: 104 mEq/L (ref 96–112)
Creatinine, Ser: 1.4 mg/dL (ref 0.4–1.5)
Glucose, Bld: 105 mg/dL — ABNORMAL HIGH (ref 70–99)
HCT: 47 % (ref 39.0–52.0)
Hemoglobin: 16 g/dL (ref 13.0–17.0)
Potassium: 3.5 mEq/L (ref 3.5–5.1)
Sodium: 141 mEq/L (ref 135–145)
TCO2: 25 mmol/L (ref 0–100)

## 2010-08-14 LAB — DIFFERENTIAL
Basophils Absolute: 0 10*3/uL (ref 0.0–0.1)
Basophils Relative: 0 % (ref 0–1)
Eosinophils Absolute: 0.2 10*3/uL (ref 0.0–0.7)
Eosinophils Relative: 2 % (ref 0–5)
Lymphocytes Relative: 17 % (ref 12–46)
Lymphs Abs: 1.9 10*3/uL (ref 0.7–4.0)
Monocytes Absolute: 0.8 10*3/uL (ref 0.1–1.0)
Monocytes Relative: 8 % (ref 3–12)
Neutro Abs: 8 10*3/uL — ABNORMAL HIGH (ref 1.7–7.7)
Neutrophils Relative %: 73 % (ref 43–77)

## 2010-08-14 LAB — URINE CULTURE
Colony Count: NO GROWTH
Culture: NO GROWTH

## 2010-08-14 LAB — URINALYSIS, ROUTINE W REFLEX MICROSCOPIC
Bilirubin Urine: NEGATIVE
Glucose, UA: NEGATIVE mg/dL
Hgb urine dipstick: NEGATIVE
Ketones, ur: NEGATIVE mg/dL
Nitrite: NEGATIVE
Protein, ur: NEGATIVE mg/dL
Specific Gravity, Urine: 1.029 (ref 1.005–1.030)
Urobilinogen, UA: 1 mg/dL (ref 0.0–1.0)
pH: 6 (ref 5.0–8.0)

## 2010-08-14 LAB — CBC
HCT: 45.4 % (ref 39.0–52.0)
Hemoglobin: 16.1 g/dL (ref 13.0–17.0)
MCHC: 35.6 g/dL (ref 30.0–36.0)
MCV: 93.3 fL (ref 78.0–100.0)
Platelets: 216 10*3/uL (ref 150–400)
RBC: 4.86 MIL/uL (ref 4.22–5.81)
RDW: 12.6 % (ref 11.5–15.5)
WBC: 11 10*3/uL — ABNORMAL HIGH (ref 4.0–10.5)

## 2010-08-14 LAB — LIPASE, BLOOD: Lipase: 20 U/L (ref 11–59)

## 2010-08-14 LAB — PROTIME-INR
INR: 1 (ref 0.00–1.49)
Prothrombin Time: 13.7 seconds (ref 11.6–15.2)

## 2010-08-17 LAB — STOOL CULTURE

## 2010-08-17 LAB — HEPATITIS A ANTIBODY, IGM: Hep A IgM: NEGATIVE

## 2010-08-17 LAB — HEPATITIS B SURFACE ANTIGEN: Hepatitis B Surface Ag: NEGATIVE

## 2010-08-17 LAB — CLOSTRIDIUM DIFFICILE EIA: C difficile Toxins A+B, EIA: NEGATIVE

## 2010-08-17 LAB — HEPATITIS C ANTIBODY: HCV Ab: NEGATIVE

## 2010-09-20 NOTE — Cardiovascular Report (Signed)
NAMEDEBBIE, Andrew Love NO.:  [id number]   MEDICAL RECORD NO.:  [id number]          PATIENT TYPE:  INP   LOCATION:  4736                         FACILITY:  [facility]   PHYSICIAN:  Cristy Hilts. Jacinto Halim, MD       DATE OF BIRTH:  08/30/1965   DATE OF PROCEDURE:  DATE OF DISCHARGE:  02/10/2008                            CARDIAC CATHETERIZATION   /DATE OF PROCEDURE/>  February 10, 2008.   PROCEDURES PERFORMED:  1. Left ventriculography.  2. Selective right and left coronary arteriography.  3. Intracoronary nitroglycerin administration.  4. Right femoral arteriography and closure of right femoral arterial      access with Star-Close.   INDICATION:  Jaystin Mcgarvey is a very pleasant 45 year old gentleman  with history of hypertension, obesity, hyperlipidemia, hypothyroidism,  who has metabolic syndrome and obstructive sleep apnea, who was admitted  to the hospital on February 07, 2008, with chest pain, very typical for  unstable angina.  He was ruled out for myocardial infarction.  Now, he  is brought to the cardiac cath lab to evaluate his coronary anatomy.   HEMODYNAMIC DATA:  The left ventricular pressure was 95/3 with end-  diastolic pressure of 7 mmHg.  Aortic pressure was 92/63 with a mean of  68 mmHg.  There was no pressure gradient across the aortic valve.   ANGIOGRAPHIC DATA:  Left ventricle:  Left ventricular systolic function  was normal with ejection fraction of 55-60%.  There was no significant  mitral regurgitation.  There was no wall motion abnormality.   Right coronary artery:  Right coronary artery is a large-caliber vessel  and dominant vessel.  The mid segment has a 30% stenosis.  There was  SLOW filling noted which improved with intracoronary nitroglycerin.   Left main coronary artery:  The left main coronary artery is a large-  caliber vessel that is smooth and normal.   Circumflex coronary artery:  The circumflex coronary artery is large-  caliber vessel  and is codominant with right coronary artery, and it is  smooth and normal.   Ramus intermediate:  The ramus intermediate is a very large-caliber  vessel.  It is smooth and normal.   Left LAD:  LAD is a large-caliber vessel in the proximal segment, gives  origin to large diagonal 1 and large diagonal 2 and becomes small and  ends just before reaching the apex.   IMPRESSION:  Slow filling noted in the coronary arteries suggestive of  microvascular spasm and/or microvascular disease.  The flow clearly  improved with intracoronary nitroglycerin administration.   RECOMMENDATIONS:  The patient has metabolic syndrome, i.e., syndrome X.  He needs aggressive risk modification.  He will be discharged home today  with Coreg 3.125 mg p.o. b.i.d., Simcor 500/20 one p.o. nightly 30  minutes after taking 325 mg of aspirin, and he will be continued on  Synthroid 0.2 mg p.o. daily and Benicar 40 mg p.o. daily.  If he  continues to have chest pain, consideration can be given for changing  him over to a calcium channel blocker and/or addition of a nitrate.  Also consideration  can be given for addition of Ranexa.   A total of 100 mL of contrast was utilized for diagnostic angiography.   TECHNIQUE OF THE PROCEDURE:  Under usual sterile precautions using a 6-  French right femoral arterial access, 6-French multipurpose B2 catheter  was advanced to the ascending aorta and then into the left ventricle.  Left ventriculography was performed both in LAO and RAO projection.  Catheter pulled into the ascending aorta.  Right coronary artery was  selectively engaged, and angiography was performed and then the catheter  was pulled out of the body and a 6-French Judkins left 4 diagnostic  catheter was utilized to engage left main coronary artery and  angiography was performed.  Intracoronary nitroglycerin was administered  into the right coronary artery.  Next, the catheter was pulled out of  body over a J-wire  and right femoral arteriography was performed through  the arterial access sheath and the access closed with Star-Close with  excellent hemostasis.  The patient tolerated the procedure.  No  immediate complication noted.      Cristy Hilts. Jacinto Halim, MD  Electronically Signed     JRG/MEDQ  D:  02/10/2008  T:  02/11/2008  Job:  [id number]   cc:   Dani Gobble, MD  Corrie Mckusick, M.D.

## 2010-09-20 NOTE — Op Note (Signed)
NAMEJERIME, ARIF NO.:  [id number]   MEDICAL RECORD NO.:  [id number]          PATIENT TYPE:  AMB   LOCATION:  DAY                           FACILITY:  [facility]   PHYSICIAN:  Kassie Mends, M.D.      DATE OF BIRTH:  1965/10/21   DATE OF PROCEDURE:  08/20/2008  DATE OF DISCHARGE:                               OPERATIVE REPORT   REFERRING Derrian Poli:  Melony Overly, PA   PROCEDURE:  Ileocolonoscopy with cold forceps biopsy and cold forceps  polypectomy.   INDICATION FOR EXAM:  Mr. Mahabir is a 45 year old male with  nonobstructive coronary artery disease.  He takes aspirin, Toradol, and  ibuprofen.  He presented with acute onset of abdominal pain followed by  bloody diarrhea.   FINDINGS:  1. Normal terminal ileum approximately 15 cm visualized.  Normal-      appearing cecum, ascending colon, and transverse colon.  Biopsies      obtained via cold forceps to evaluate for microscopic colitis as an      etiology for his diarrhea.  2. Multiple areas of patchy erythema with frequent ulcerations seen      from the splenic flexure to the sigmoid colon.  The rectum was      spared.  Biopsies were obtained via cold forceps to evaluate for      ischemic colitis or NSAID-induced colitis.  3. Frequent left-sided diverticula.  Otherwise, no masses or AVMs      seen.  4. Normal rectum, including the retroflexed view.  No hemorrhoids      appreciated.  5. A 3-mm sigmoid colon and rectal polyp removed via cold forceps.   DIAGNOSES:  1. Colitis, etiology unknown.  2. Malrotation of the gut.  3. Sigmoid colon and rectal polyp.   RECOMMENDATIONS:  1. Screening colonoscopy in 10 years.  2. He should follow a high-fiber diet.  He was given a handout on high-      fiber diet.  3. No aspirin or anti-inflammatory drugs for 30 days.  4. Will call Mr. Woodford with results of his biopsies.   I personally reviewed a CT scan with Dr. Amil Amen and he confirmed that  the small bowel  is malrotated with the cecum in the left lower quadrant.  No bowel wall edema was appreciated.  His SMA axis and celiac axis are  widely patent without any evidence of calcifications.  He had no  mesenteric edema.   MEDICATIONS:  1. Demerol 125 mg IV.  2. Versed 5 mg IV.  3. Phenergan 6.25 mg IV.   PROCEDURE TECHNIQUE:  Physical exam was performed.  Informed consent was  obtained from the patient after explaining the benefits, risks, and  alternatives to the procedure.  The patient was connected to the monitor  and placed in left lateral position.  Continuous oxygen was provided by  nasal cannula and IV medicine administered through an indwelling  cannula.  After administration of sedation and rectal exam, the  patient's rectum was intubated and scope was advanced under direct  visualization to the distal terminal ileum.  The scope was  removed  slowly by carefully examining the color, texture, anatomy, and integrity  of mucosa on the way out.  The patient was recovered in endoscopy and  discharged home in satisfactory condition.   PATH:  Random biopsies: nl. Left colon chnages: likely 2o to NSAIDs.  Hyperplastic plyp. TCS in 10 years.      Kassie Mends, M.D.  Electronically Signed     SM/MEDQ  D:  08/20/2008  T:  08/21/2008  Job:  [id number]   cc:   Melony Overly, PA

## 2010-09-23 NOTE — Discharge Summary (Signed)
NAMEMANFORD, SPRONG NO.:  [id number]   MEDICAL RECORD NO.:  [id number]          PATIENT TYPE:  INP   LOCATION:  4736                         FACILITY:  [facility]   PHYSICIAN:  Cristy Hilts. Jacinto Halim, MD       DATE OF BIRTH:  March 01, 1966   DATE OF ADMISSION:  02/07/2008  DATE OF DISCHARGE:  02/10/2008                               DISCHARGE SUMMARY   DISCHARGE DIAGNOSES:  1. Chest pain, initially thought to be unstable angina but negative      myocardial infarction and essentially normal coronary arteries.      Most likely chest wall pain.  2. Hypothyroidism.  3. Hypertension.  4. Tobacco use.   CONDITION ON DISCHARGE:  Improved.   PROCEDURES:  Combined left heart cath, February 10, 2008 by Dr. Yates Decamp  with 30% RCA stenosis but with patent coronary arteries, though slow  filling secondary to microvascular disease/spasm.   DISCHARGE MEDICATIONS:  1. Benicar 40 mg half a tablet daily which is a decreased dose.  2. Synthroid 200 mcg daily.  3. Calcium 1200 mg daily.  4. Vitamin C every other day.  5. Ambien occasionally.  6. Aspirin 325 mg 30 minutes prior to Symbicort.  7. Symbicort 500/20 every evening.  8. Coreg 3.125 one twice a day.   DISCHARGE INSTRUCTIONS:  1. May return to work, February 17, 2008.  2. Increase activity slowly.  May shower.  No lifting for 2 days.  No      driving for 2 days.  3. Wash cath site with soap and water.  Call if any bleeding,      swelling, or drainage.  No bathtubs or swimming for 1 week due to      suture in leg.  4. Follow up with Dr. Domingo Sep, February 27, 2008 at 12 noon.   HISTORY OF PRESENT ILLNESS:  A 45 year old male with history of chest  pain and history of submaximal exertion stress test this spring.  The  patient did not have any problems over the summer until February 07, 2008.  He developed midsternal and substernal chest pain and pressure  associated with mild diaphoresis and left hand numbness.  He was seen in  the  emergency room.  EKG without acute changes.  It was felt he should  be admitted.  He was placed on Lovenox and beta-blocker was added.   PAST MEDICAL HISTORY:  Hypothyroidism.  He had thyroid surgery in 2006.  He is followed by Dr. Lurene Shadow.  He has had appendectomy, rotator cuff  surgery, and a history of hypertension.   OUTPATIENT MEDICATIONS:  Synthroid, Benicar, and calcium.   ALLERGIES:  No known allergies.   FAMILY HISTORY/SOCIAL HISTORY/REVIEW OF SYSTEMS:  See H&P.   PHYSICAL EXAMINATION ON DISCHARGE:  VITAL SIGNS:  Blood pressure 111/66  and pulse 59.  GENERAL:  Alert and oriented male in no acute distress.  LUNGS:  Clear.  HEART:  Regular rate s1s2 normal.   Prior to discharge, the right groin cath site with StarClose oozed.  Dressing was changed, but after 2 hours it had stopped bleeding.  He was  able to ambulate without further oozing and he truly wanted to go home.  He was instructed if it started oozing again to call us back, otherwise  he could be discharged.   LABORATORY VALUES:  Hemoglobin 17, hematocrit 49, WBC 8, platelets 268,  neutrophils 57, lymphs 32, monos 6, eosinophils 4, and basophils 1.  Protime 13.7, INR of 1, and PTT 32.   Sodium 141, potassium 3.7, chloride 106, CO2 26, glucose 72, BUN 9,  creatinine 0.98, and these remained stable.   Total protein 7.2, albumin 4.3, AST 33, ALT 57, alkaline phos 72, and  total bili 1.1.   Cardiac enzymes were all negative.  CK 165-78 and MB 1.8-0.9.  Troponin  I 0.01-0.02.  BNP was less than 30.  Total cholesterol 199,  triglycerides 248, HDL 23, and LDL 126.   EKG on admission, sinus rhythm, regular rate and rhythm without acute  changes.  Chest x-ray, early venous distention, no heart failure.   HOSPITAL COURSE:  The patient was admitted by Dr. Jacinto Halim secondary to  chest pain and he has had a negative Myoview in the past, though it was  felt maximum.   He does have a history of hypertension and tobacco use  and was felt he  had unstable angina.  He was admitted, placed on Lovenox, beta-blocker,  PPI, aspirin, and pain continued off and on the day of admission and  again during the night prior to the third.  The cardiac enzymes were  negative.  The patient continued with episodic chest pain.  It was felt  he needed cardiac catheterization due to ongoing discomfort and he  underwent cardiac catheterization on February 10, 2008 revealing 30% RCA  stenosis, but he did have microvascular disease/spasm.  Medications were  adjusted by Dr. Jacinto Halim and by the evening of February 10, 2008, he was  stable and ready for discharge home.  He will follow up as an outpatient  with Dr. Domingo Sep.     Darcella Gasman. [place], N.P.      Cristy Hilts. Jacinto Halim, MD  Electronically Signed   LRI/MEDQ  D:  02/12/2008  T:  02/13/2008  Job:  [id number]   cc:   Dani Gobble, MD

## 2010-09-23 NOTE — Procedures (Signed)
   [name], Andrew Love                        ACCOUNT NO.:  [id number]   MEDICAL RECORD NO.:  [id number]                   PATIENT TYPE:  EMS   LOCATION:  ED                                   FACILITY:  [facility]   PHYSICIAN:  Edward L. Juanetta Gosling, M.D.             DATE OF BIRTH:  1966-03-12   DATE OF PROCEDURE:  12/24/2001  DATE OF DISCHARGE:  12/24/2001                                EKG INTERPRETATION   TIME:  2103 hours on December 24, 2001:  The rhythm was sinus rhythm on the  right in the 80s.  There was left axis deviation.  T waves in the precordial leads are rather peaked which could be due to a  number of abnormalities including electrolyte imbalance.   IMPRESSION:  Abnormal electrocardiogram.                                               Edward L. Juanetta Gosling, M.D.    ELH/MEDQ  D:  02/05/2002  T:  02/07/2002  Job:  [id number]

## 2010-09-23 NOTE — Op Note (Signed)
Andrew Love, Andrew Love NO.:  [id number]   MEDICAL RECORD NO.:  [id number]          PATIENT TYPE:  OIB   LOCATION:  2550                         FACILITY:  [facility]   PHYSICIAN:  Velora Heckler, MD      DATE OF BIRTH:  Apr 05, 1966   DATE OF PROCEDURE:  07/10/2006  DATE OF DISCHARGE:                               OPERATIVE REPORT   PREOPERATIVE DIAGNOSIS:  Bilateral thyroid nodules with cytologic  atypia.   POSTOPERATIVE DIAGNOSIS:  Bilateral thyroid nodules with cytologic  atypia, lymphocytic thyroiditis.   PROCEDURE:  Total thyroidectomy.   SURGEON:  Velora Heckler, MD, FACS   ASSISTANT:  Karie Soda, MD   ANESTHESIA:  General.   ESTIMATED BLOOD LOSS:  Minimal.   PREPARATION:  Betadine.   COMPLICATIONS:  None.   INDICATIONS:  The patient is 45 year old white male from [place],  [place].  He has been followed by Dorisann Frames, M.D.  He has  multiple thyroid nodules.  He has been on low-dose thyroid suppression  with Synthroid.  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  The patient has developed compressive symptoms  with intermittent pain, pressure sensation and chronic cough.  The  patient now comes to surgery for thyroidectomy.   BODY OF REPORT:  The procedure was done in OR #17 at the [facility].  The patient is brought to the operating room, placed  in supine position on the operating room table.  Following  administration of general anesthesia, the patient is prepped and draped  in usual strict aseptic fashion.  After ascertaining that an adequate  level of anesthesia had been obtained, a Kocher incision was made a with  #15 blade.  Dissection was carried through subcutaneous tissues and  platysma.  Hemostasis was obtained with electrocautery.  Skin flaps were  elevated cephalad and caudad from the thyroid notch to the sternal  notch.  The Mahorner self-retaining retractor was placed for  exposure.  Strap muscles are incised in the midline, dissection is begun on the  left side on the neck.  The left thyroid lobe was markedly enlarged.  It  was dense, firm and indurated, consistent with either a severe  thyroiditis versus infiltration with neoplasm.  No significant  lymphadenopathy was identified.  Left lobe is gently exposed using a  Barista.  Middle vein is divided between medium Ligaclips.  Inferior venous tributaries were divided with Harmonic scalpel.  Superior pole is gently dissected out.  Superior pole vessels were  divided between medium Ligaclips with the Harmonic scalpel.  Gland is  rolled anteriorly with some difficulty.  Due to the rigid nature of the  gland it is difficult to achieve full exposure.  Gland is also quite  friable.  Hemostasis was meticulously maintained.  Further venous  tributaries were divided between small Ligaclips.  Branches of the  inferior thyroid artery are gently dissected out and divided with the  Harmonic scalpel.  Gland is rolled further anteriorly.  Parathyroid  tissue was identified and preserved.  Ligament of Allyson Sabal is transected  with  electrocautery.  Gland is dissected off of the thyroid cartilage  and the trachea with electrocautery.  Gland is densely adherent to these  structures.  The isthmus is mobilized across the midline and transected  at its junction with the right thyroid lobe with the Harmonic scalpel.  Specimen is submitted to pathology where Dr. Star Age repeated.  She  notes diffuse severe lymphocytic thyroiditis.  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  No malignancy was identified.   Next we turned our attention to the right lobe.  In the inferior pole of  the right lobe is a 4-cm dominant mass.  Anesthesia notes that this mass  displaces the trachea leftward.  Decision is made to proceed with total  thyroidectomy.  The middle thyroid vein is divided between Ligaclips.  Superior  pole vessels were again dissected out and divided between  medium Ligaclips with Harmonic scalpel.  Gland is rolled anteriorly.  Inferior venous tributaries are divided between Ligaclips with Harmonic  scalpel.  Branches of the inferior thyroid artery are divided with  Harmonic scalpel.  Venous tributaries were divided between small  Ligaclips.  Gland is rolled anteriorly.  There is a large tubercle of  Zuckerkandl which extends posteriorly almost to the precervical fascia.  It is gently dissected out taking care to avoid injury to the underlying  recurrent laryngeal nerve which is identified and preserved.  Parathyroid tissue was preserved.  Gland is dissected out up to level of  the ligament of Allyson Sabal which is transected with electrocautery.  The  remainder of the gland is then excised off of the underlying trachea  with the electrocautery to which it has been densely adherent.  Good  hemostasis is noted.  Right lobe was submitted to pathology for  permanent sections only.  Neck is irrigated with warm saline.  Hemostasis was obtained with small Ligaclips.  Surgicel was placed over  the area of the recurrent nerve and parathyroid glands bilaterally.  Strap muscles were reapproximated in the midline with interrupted 3-0  Vicryl sutures.  Platysma was closed with interrupted 3-0 Vicryl  sutures.  Skin was closed with running 4-0 Vicryl subcuticular suture.  Wound is washed and dried and Benzoin and Steri-Strips were applied.  Sterile dressings were applied.  The patient is awakened from anesthesia  and brought to the recovery room in stable condition.  The patient  tolerated the procedure well.      Velora Heckler, MD  Electronically Signed     TMG/MEDQ  D:  07/10/2006  T:  07/10/2006  Job:  [id number]   cc:   Dorisann Frames, M.D.  Madelin Rear. Sherwood Gambler, MD

## 2010-09-23 NOTE — Group Therapy Note (Signed)
   NAMESABRI, TEAL                        ACCOUNT NO.:  [id number]   MEDICAL RECORD NO.:  [id number]                   PATIENT TYPE:  EMS   LOCATION:  ED                                   FACILITY:  [facility]   PHYSICIAN:  Fredirick Maudlin, M.D.              DATE OF BIRTH:  Oct 05, 1965   DATE OF PROCEDURE:  DATE OF DISCHARGE:  12/24/2001                                   PROGRESS NOTE   ELECTROCARDIOGRAM:  The rhythm is sinus rhythm with a rate in the 80's.  There is an interventricular conduction delay, most marked inferiorly. Small  Q-waves are seen inferiorly. This clinical correlation is suggestive to  determine anything of significance.   ASSESSMENT:  Minimally abnormal electrocardiogram.                                               Fredirick Maudlin, M.D.    ELH/MEDQ  D:  12/25/2001  T:  12/26/2001  Job:  [id number]

## 2010-09-27 ENCOUNTER — Encounter (INDEPENDENT_AMBULATORY_CARE_PROVIDER_SITE_OTHER): Payer: Self-pay | Admitting: General Surgery

## 2010-09-27 DIAGNOSIS — R058 Other specified cough: Secondary | ICD-10-CM

## 2010-09-27 DIAGNOSIS — E079 Disorder of thyroid, unspecified: Secondary | ICD-10-CM

## 2010-09-27 DIAGNOSIS — R0989 Other specified symptoms and signs involving the circulatory and respiratory systems: Secondary | ICD-10-CM

## 2010-09-27 DIAGNOSIS — R630 Anorexia: Secondary | ICD-10-CM

## 2010-09-27 DIAGNOSIS — R42 Dizziness and giddiness: Secondary | ICD-10-CM

## 2010-09-27 DIAGNOSIS — R05 Cough: Secondary | ICD-10-CM | POA: Insufficient documentation

## 2010-09-27 DIAGNOSIS — R233 Spontaneous ecchymoses: Secondary | ICD-10-CM | POA: Insufficient documentation

## 2010-09-27 DIAGNOSIS — R55 Syncope and collapse: Secondary | ICD-10-CM

## 2010-09-27 DIAGNOSIS — R112 Nausea with vomiting, unspecified: Secondary | ICD-10-CM

## 2010-09-27 DIAGNOSIS — R509 Fever, unspecified: Secondary | ICD-10-CM

## 2010-09-27 DIAGNOSIS — R238 Other skin changes: Secondary | ICD-10-CM | POA: Insufficient documentation

## 2010-09-27 DIAGNOSIS — R61 Generalized hyperhidrosis: Secondary | ICD-10-CM | POA: Insufficient documentation

## 2010-09-27 DIAGNOSIS — R490 Dysphonia: Secondary | ICD-10-CM

## 2010-09-27 DIAGNOSIS — G4700 Insomnia, unspecified: Secondary | ICD-10-CM | POA: Insufficient documentation

## 2010-09-27 DIAGNOSIS — Z973 Presence of spectacles and contact lenses: Secondary | ICD-10-CM

## 2010-09-27 DIAGNOSIS — I10 Essential (primary) hypertension: Secondary | ICD-10-CM | POA: Insufficient documentation

## 2010-09-27 DIAGNOSIS — H6690 Otitis media, unspecified, unspecified ear: Secondary | ICD-10-CM

## 2010-09-27 DIAGNOSIS — R531 Weakness: Secondary | ICD-10-CM

## 2010-09-27 DIAGNOSIS — M1990 Unspecified osteoarthritis, unspecified site: Secondary | ICD-10-CM

## 2010-09-27 DIAGNOSIS — E7800 Pure hypercholesterolemia, unspecified: Secondary | ICD-10-CM

## 2010-09-27 DIAGNOSIS — J349 Unspecified disorder of nose and nasal sinuses: Secondary | ICD-10-CM | POA: Insufficient documentation

## 2010-09-27 DIAGNOSIS — R6883 Chills (without fever): Secondary | ICD-10-CM | POA: Insufficient documentation

## 2010-09-27 DIAGNOSIS — R519 Headache, unspecified: Secondary | ICD-10-CM

## 2011-02-06 LAB — CARDIAC PANEL(CRET KIN+CKTOT+MB+TROPI)
CK, MB: 0.9
CK, MB: 1.1
CK, MB: 1.2
Relative Index: 1.1
Relative Index: INVALID
Relative Index: INVALID
Total CK: 111
Total CK: 78
Total CK: 91
Troponin I: 0.01
Troponin I: 0.02
Troponin I: 0.02

## 2011-02-06 LAB — DIFFERENTIAL
Basophils Absolute: 0.1
Basophils Relative: 1
Eosinophils Absolute: 0.3
Eosinophils Relative: 4
Lymphocytes Relative: 32
Lymphs Abs: 2.6
Monocytes Absolute: 0.5
Monocytes Relative: 6
Neutro Abs: 4.6
Neutrophils Relative %: 57

## 2011-02-06 LAB — CBC
HCT: 44.5
HCT: 47.3
HCT: 49.6
HCT: 50.1
Hemoglobin: 15.5
Hemoglobin: 16.2
Hemoglobin: 17.1 — ABNORMAL HIGH
Hemoglobin: 17.1 — ABNORMAL HIGH
MCHC: 34.1
MCHC: 34.3
MCHC: 34.5
MCHC: 34.8
MCV: 95.3
MCV: 95.4
MCV: 95.6
MCV: 95.8
Platelets: 195
Platelets: 245
Platelets: 264
Platelets: 268
RBC: 4.66
RBC: 4.94
RBC: 5.2
RBC: 5.25
RDW: 12.7
RDW: 13
RDW: 13
RDW: 13.3
WBC: 8.1
WBC: 8.1
WBC: 9.4
WBC: 9.8

## 2011-02-06 LAB — POCT I-STAT, CHEM 8
BUN: 9
Calcium, Ion: 1.12
Chloride: 106
Creatinine, Ser: 1.1
Glucose, Bld: 85
HCT: 50
Hemoglobin: 17
Potassium: 3.7
Sodium: 141
TCO2: 24

## 2011-02-06 LAB — POCT CARDIAC MARKERS
CKMB, poc: <1 — ABNORMAL LOW
CKMB, poc: <1 — ABNORMAL LOW
Myoglobin, poc: 102
Myoglobin, poc: 69.5
Troponin i, poc: <0.05
Troponin i, poc: <0.05

## 2011-02-06 LAB — LIPID PANEL
Cholesterol: 199
HDL: 23 — ABNORMAL LOW
LDL Cholesterol: 126 — ABNORMAL HIGH
Total CHOL/HDL Ratio: 8.7
Triglycerides: 248 — ABNORMAL HIGH
VLDL: 50 — ABNORMAL HIGH

## 2011-02-06 LAB — COMPREHENSIVE METABOLIC PANEL
ALT: 57 — ABNORMAL HIGH
AST: 33
Albumin: 4.3
Alkaline Phosphatase: 72
BUN: 9
CO2: 26
Calcium: 9.5
Chloride: 107
Creatinine, Ser: 0.98
GFR calc Af Amer: >60
GFR calc non Af Amer: >60
Glucose, Bld: 72
Potassium: 3.4 — ABNORMAL LOW
Sodium: 142
Total Bilirubin: 1.1
Total Protein: 7.2

## 2011-02-06 LAB — CK TOTAL AND CKMB (NOT AT ARMC)
CK, MB: 1.8
Relative Index: 1.1
Total CK: 165

## 2011-02-06 LAB — BASIC METABOLIC PANEL
BUN: 11
CO2: 28
Calcium: 9.5
Chloride: 104
Creatinine, Ser: 1.08
GFR calc Af Amer: >60
GFR calc non Af Amer: >60
Glucose, Bld: 88
Potassium: 4.3
Sodium: 139

## 2011-02-06 LAB — TROPONIN I: Troponin I: 0.01

## 2011-02-06 LAB — PROTIME-INR
INR: 1
Prothrombin Time: 13.7

## 2011-02-06 LAB — B-NATRIURETIC PEPTIDE (CONVERTED LAB): Pro B Natriuretic peptide (BNP): <30

## 2011-02-06 LAB — APTT: aPTT: 32

## 2011-03-18 ENCOUNTER — Emergency Department (HOSPITAL_COMMUNITY)
Admission: EM | Admit: 2011-03-18 | Discharge: 2011-03-18 | Disposition: A | Discharge status: Home / Self Care | Payer: 59 | Attending: Emergency Medicine | Admitting: Emergency Medicine

## 2011-03-18 ENCOUNTER — Emergency Department (HOSPITAL_COMMUNITY): Payer: 59

## 2011-03-18 ENCOUNTER — Encounter (HOSPITAL_COMMUNITY): Payer: Self-pay | Admitting: Emergency Medicine

## 2011-03-18 DIAGNOSIS — I2510 Atherosclerotic heart disease of native coronary artery without angina pectoris: Secondary | ICD-10-CM | POA: Insufficient documentation

## 2011-03-18 DIAGNOSIS — F17200 Nicotine dependence, unspecified, uncomplicated: Secondary | ICD-10-CM | POA: Insufficient documentation

## 2011-03-18 DIAGNOSIS — Q6589 Other specified congenital deformities of hip: Secondary | ICD-10-CM | POA: Insufficient documentation

## 2011-03-18 DIAGNOSIS — E079 Disorder of thyroid, unspecified: Secondary | ICD-10-CM | POA: Insufficient documentation

## 2011-03-18 DIAGNOSIS — M25559 Pain in unspecified hip: Secondary | ICD-10-CM

## 2011-03-18 DIAGNOSIS — Z9889 Other specified postprocedural states: Secondary | ICD-10-CM | POA: Insufficient documentation

## 2011-03-18 MED ORDER — PREDNISONE 20 MG PO TABS
60.0000 mg | ORAL_TABLET | Freq: Once | ORAL | Status: AC
  Administered 2011-03-18: 60 mg via ORAL
  Filled 2011-03-18: qty 3

## 2011-03-18 MED ORDER — HYDROCODONE-ACETAMINOPHEN 5-325 MG PO TABS
2.0000 | ORAL_TABLET | Freq: Once | ORAL | Status: AC
  Administered 2011-03-18: 2 via ORAL
  Filled 2011-03-18: qty 2

## 2011-03-18 MED ORDER — OXYCODONE-ACETAMINOPHEN 5-325 MG PO TABS: 1.0000 | ORAL_TABLET | ORAL | Status: AC | PRN

## 2011-03-18 MED ORDER — PREDNISONE 10 MG PO TABS: ORAL_TABLET | ORAL | Status: DC

## 2011-03-18 MED ORDER — ONDANSETRON HCL 4 MG PO TABS
4.0000 mg | ORAL_TABLET | Freq: Once | ORAL | Status: AC
  Administered 2011-03-18: 4 mg via ORAL
  Filled 2011-03-18: qty 1

## 2011-03-18 NOTE — ED Notes (Signed)
Patient c/o right hip pain radiating down right leg starting today.

## 2011-03-18 NOTE — ED Provider Notes (Signed)
Medical screening examination/treatment/procedure(s) were performed by non-physician practitioner and as supervising physician I was immediately available for consultation/collaboration.   Geoffery Lyons, MD 03/18/11 [phone]

## 2011-03-18 NOTE — ED Notes (Signed)
Pt a/ox4. resp even and unlabored. NAD at this time. D/c instructions reviewed with pt. Pt verbalized understanding. Pt ambulated to POV with steady gate. Wife with pt to transport home.

## 2011-03-18 NOTE — ED Provider Notes (Signed)
History     CSN: [id number] Arrival date & time: 03/18/2011  9:21 PM   None     Chief Complaint  Patient presents with  . Hip Pain    (Consider location/radiation/quality/duration/timing/severity/associated sxs/prior treatment) Patient is a 45 y.o. male presenting with hip pain. The history is provided by the patient.  Hip Pain This is a recurrent problem. The current episode started today. The problem occurs constantly. The problem has been gradually worsening. Associated symptoms include arthralgias. Pertinent negatives include no abdominal pain, anorexia, change in bowel habit, chest pain, coughing or neck pain. The symptoms are aggravated by bending, standing and walking. He has tried nothing for the symptoms. The treatment provided no relief.    Past Medical History  Diagnosis Date  . CAD (coronary artery disease)   . Elevated liver enzymes   . Diverticulitis     Hx of; requiring 3 admissions  . Sigmoid colon ulcer     Rectal polyps  . Thyroid disease   . Psoriasis     Per medical history form dated 06/13/10.  . Chronic insomnia     Per medical history form dated 06/13/10.  . Colitis     Per medical history from dated 06/13/10.    Past Surgical History  Procedure Date  . Colonoscopy 07/2008    Colitis,NSAID v. Ischemia,malrotation of the gut,Diverticulosis(L),hyperplastic  . Cardiac catheterization 2009  . Appendectomy   . Shoulder surgery     Left  . Rotator cuff repair     Left - per medical history form dated 06/13/10.  . Cardiac catheterization     Per medical history from dated 06/13/10.  . Thyroidectomy     Per medical history form dated 06/13/10.  . Colon surgery     Family History  Problem Relation Age of Onset  . Cancer Mother     breast cancer - per medical history form dated 06/13/10.  . Diverticulitis Mother   . Hypertension Mother   . Cancer Father     skin - per medical history form dated 06/13/10.  Marland Kitchen Hypertension Father     History  Substance Use  Topics  . Smoking status: Current Everyday Smoker -- 1.0 packs/day  . Smokeless tobacco: Not on file  . Alcohol Use: Yes     Occasional      Review of Systems  Constitutional: Negative for activity change.       All ROS Neg except as noted in HPI  HENT: Negative for nosebleeds and neck pain.   Eyes: Negative for photophobia and discharge.  Respiratory: Negative for cough, shortness of breath and wheezing.   Cardiovascular: Negative for chest pain and palpitations.  Gastrointestinal: Negative for abdominal pain, blood in stool, anorexia and change in bowel habit.  Genitourinary: Negative for dysuria, frequency and hematuria.  Musculoskeletal: Positive for back pain, arthralgias and gait problem.  Skin: Negative.   Neurological: Negative for dizziness, seizures and speech difficulty.  Psychiatric/Behavioral: Negative for hallucinations and confusion.    Allergies  Review of patient's allergies indicates no known allergies.  Home Medications   Current Outpatient Rx  Name Route Sig Dispense Refill  . CALCIUM 1200 PO Oral Take by mouth.      . OMEGA-3 FATTY ACIDS 1000 MG PO CAPS Oral Take 2 g by mouth daily.      Marland Kitchen LEVOTHYROXINE SODIUM 200 MCG PO TABS Oral Take 200 mcg by mouth daily.      Marland Kitchen OLMESARTAN MEDOXOMIL 20 MG PO TABS Oral Take  20 mg by mouth daily.      . OXYCODONE-ACETAMINOPHEN 5-325 MG PO TABS Oral Take 1 tablet by mouth every 4 (four) hours as needed.      Marland Kitchen POTASSIUM CHLORIDE CRYS CR 20 MEQ PO TBCR Oral Take 20 mEq by mouth 2 (two) times daily.      Marland Kitchen PROMETHAZINE HCL 25 MG PO TABS Oral Take 25 mg by mouth as needed.      Marland Kitchen ZOLPIDEM TARTRATE 10 MG PO TABS Oral Take 10 mg by mouth at bedtime as needed.        BP 159/87  Pulse 90  Temp(Src) 98.2 F (36.8 C) (Oral)  Resp 20  Ht 6\' 1"  (1.854 m)  Wt 234 lb (106.142 kg)  BMI 30.87 kg/m2  SpO2 99%  Physical Exam  Nursing note and vitals reviewed. Constitutional: He is oriented to person, place, and time. He  appears well-developed and well-nourished.  Non-toxic appearance.  HENT:  Head: Normocephalic.  Right Ear: Tympanic membrane and external ear normal.  Left Ear: Tympanic membrane and external ear normal.  Eyes: EOM and lids are normal. Pupils are equal, round, and reactive to light.  Neck: Normal range of motion. Neck supple. Carotid bruit is not present.  Cardiovascular: Normal rate, regular rhythm, normal heart sounds, intact distal pulses and normal pulses.   Pulmonary/Chest: Breath sounds normal. No respiratory distress.  Abdominal: Soft. Bowel sounds are normal. There is no tenderness. There is no guarding.  Musculoskeletal: Normal range of motion.       Pain to palpation an attempted ROM of the right hip. No deformity. No fractional shortening. Distal pulses symetrical bilat.  Lymphadenopathy:       Head (right side): No submandibular adenopathy present.       Head (left side): No submandibular adenopathy present.    He has no cervical adenopathy.  Neurological: He is alert and oriented to person, place, and time. He has normal strength. No cranial nerve deficit or sensory deficit.  Skin: Skin is warm and dry.  Psychiatric: He has a normal mood and affect. His speech is normal.    ED Course  Procedures (including critical care time)  Labs Reviewed - No data to display Dg Hip Complete Right  03/18/2011  *RADIOLOGY REPORT*  Clinical Data: The right hip pain.  RIGHT HIP - COMPLETE 2+ VIEW  Comparison: None  Findings: Both hips are normally located.  There are advanced degenerative changes for age along with mild hip dysplasia.  No acute fracture or findings for avascular necrosis.  The pubic symphysis and SI joints are intact.  No pelvic fractures.  IMPRESSION:  1.  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 2.  No acute bony findings.  Original Report Authenticated By: P. Loralie Champagne, M.D.     Dx: Right Hip pain   2. Bilat hip  dysplasia    MDM  I have reviewed nursing notes, vital signs, and all appropriate lab and imaging results for this patient. Previous xrays reviewed. Pt to see Dr Thurston Hole or orthopedic MD of his choice for evaluation.        Kathie Dike, [place] 03/18/11 2249

## 2011-06-09 DIAGNOSIS — M1631 Unilateral osteoarthritis resulting from hip dysplasia, right hip: Secondary | ICD-10-CM

## 2011-06-09 HISTORY — DX: Unilateral osteoarthritis resulting from hip dysplasia, right hip: M16.31

## 2011-06-16 ENCOUNTER — Other Ambulatory Visit: Payer: Self-pay | Admitting: Orthopedic Surgery

## 2011-06-23 ENCOUNTER — Encounter (HOSPITAL_COMMUNITY): Payer: Self-pay | Admitting: Pharmacy Technician

## 2011-06-28 ENCOUNTER — Other Ambulatory Visit: Payer: Self-pay

## 2011-06-28 ENCOUNTER — Encounter (HOSPITAL_COMMUNITY): Payer: Self-pay

## 2011-06-28 ENCOUNTER — Encounter (HOSPITAL_COMMUNITY)
Admission: RE | Admit: 2011-06-28 | Discharge: 2011-06-28 | Disposition: A | Discharge status: Home / Self Care | Payer: 59 | Source: Ambulatory Visit | Attending: Orthopedic Surgery | Admitting: Orthopedic Surgery

## 2011-06-28 HISTORY — DX: Dorsalgia, unspecified: M54.9

## 2011-06-28 HISTORY — DX: Bronchitis, not specified as acute or chronic: J40

## 2011-06-28 HISTORY — DX: Hypothyroidism, unspecified: E03.9

## 2011-06-28 HISTORY — DX: Essential (primary) hypertension: I10

## 2011-06-28 HISTORY — DX: Unspecified osteoarthritis, unspecified site: M19.90

## 2011-06-28 LAB — CBC
HCT: 44.2 % (ref 39.0–52.0)
Hemoglobin: 16.1 g/dL (ref 13.0–17.0)
MCH: 33.7 pg (ref 26.0–34.0)
MCHC: 36.4 g/dL — ABNORMAL HIGH (ref 30.0–36.0)
MCV: 92.5 fL (ref 78.0–100.0)
Platelets: 255 10*3/uL (ref 150–400)
RBC: 4.78 MIL/uL (ref 4.22–5.81)
RDW: 12.6 % (ref 11.5–15.5)
WBC: 10 10*3/uL (ref 4.0–10.5)

## 2011-06-28 LAB — ABO/RH: ABO/RH(D): A POS

## 2011-06-28 LAB — BASIC METABOLIC PANEL
BUN: 11 mg/dL (ref 6–23)
CO2: 25 mEq/L (ref 19–32)
Calcium: 9.7 mg/dL (ref 8.4–10.5)
Chloride: 102 mEq/L (ref 96–112)
Creatinine, Ser: 0.91 mg/dL (ref 0.50–1.35)
GFR calc Af Amer: >90 mL/min (ref >90)
GFR calc non Af Amer: >90 mL/min (ref >90)
Glucose, Bld: 88 mg/dL (ref 70–99)
Potassium: 3.9 mEq/L (ref 3.5–5.1)
Sodium: 136 mEq/L (ref 135–145)

## 2011-06-28 LAB — URINALYSIS, ROUTINE W REFLEX MICROSCOPIC
Bilirubin Urine: NEGATIVE
Glucose, UA: NEGATIVE mg/dL
Ketones, ur: NEGATIVE mg/dL
Leukocytes, UA: NEGATIVE
Nitrite: NEGATIVE
Protein, ur: NEGATIVE mg/dL
Specific Gravity, Urine: 1.025 (ref 1.005–1.030)
Urobilinogen, UA: 0.2 mg/dL (ref 0.0–1.0)
pH: 6 (ref 5.0–8.0)

## 2011-06-28 LAB — URINE MICROSCOPIC-ADD ON

## 2011-06-28 LAB — SURGICAL PCR SCREEN
MRSA, PCR: NEGATIVE
Staphylococcus aureus: NEGATIVE

## 2011-06-28 LAB — APTT: aPTT: 32 seconds (ref 24–37)

## 2011-06-28 LAB — PROTIME-INR
INR: 1.01 (ref 0.00–1.49)
Prothrombin Time: 13.5 seconds (ref 11.6–15.2)

## 2011-06-28 LAB — TYPE AND SCREEN
ABO/RH(D): A POS
Antibody Screen: NEGATIVE

## 2011-06-28 NOTE — Pre-Procedure Instructions (Signed)
20 Andrew Love  06/28/2011   Your procedure is scheduled on:  July 04, 2011 at 12:45 pm  Report to [facility] at 10:45 AM.  Call this number if you have problems the morning of surgery: [phone]   Remember:   Do not eat food:After Midnight.  May have clear liquids: up to 4 Hours before arrival until 6:45 AM  Clear liquids include soda, tea, black coffee, apple or grape juice, broth.  Take these medicines the morning of surgery with A SIP OF WATER: Levothyroxine, Oxycodone and Phenergan (if needed)    Do not wear jewelry, make-up or nail polish.  Do not wear lotions, powders, or perfumes. You may wear deodorant.  Do not shave 48 hours prior to surgery.  Do not bring valuables to the hospital.  Contacts, dentures or bridgework may not be worn into surgery.  Leave suitcase in the car. After surgery it may be brought to your room.  For patients admitted to the hospital, checkout time is 11:00 AM the day of discharge.   Patients discharged the day of surgery will not be allowed to drive home.  Name and phone number of your driver: Family  Special Instructions: Incentive Spirometry - Practice and bring it with you on the day of surgery. and CHG Shower Use Special Wash: 1/2 bottle night before surgery and 1/2 bottle morning of surgery.   Please read over the following fact sheets that you were given: Pain Booklet, Coughing and Deep Breathing, Total Joint Packet and Surgical Site Infection Prevention

## 2011-06-29 NOTE — Consult Note (Signed)
Anesthesia:  Patient is a 46 year old male scheduled for a right THA on 07/04/11.  History includes CAD (mild, 30% RCA), thyroidectomy with secondary hypothyroidism, HTN, OA, psoriasis, colitis, elevated LFTs (2010 when on statin medication), smoking, prior shoulder and s/p lap assisted sigmoid resection on 08/08/10 due to diverticulitis.  His EKG shows NSR, minimal voltage criteria for LVH.  EKG was not felt significantly changed since 07/27/10.  He has been followed at [facility] in the past, last office note is from 2010.  He had a normal stress test on 07/29/07, but ultimately had a cardiac cath on 02/10/08 (Dr. Jacinto Halim) after being admitted for symptoms typical of unstable angina.  His cath showed patent coronaries with 30% RCA stenosis, however slow filling was noted in the coronary arteries suggestive of microvascular spasm and/or microvascular disease. EF 55-60%.    Echo from [facility] done 07/29/07 (see old chart) showed normal LV systolic function, borderline LVH, findings suggestive of impaired LV relaxation, trace AR/TR/MR.  According to Dr. Verl Dicker last note in April 2010, it appears he was transitioning Mr. Handyside's HLD and microvascular disease management back to his PCP Dr. Assunta Found.  He did not report any chest pain symptoms during his PAT visit.  CXR shows no active disease.  Labs noted.  (LFTs were not done at PAT, but it appears they were back to normal since at least 04/22/10 and were last checked on 07/31/10.  He is no longer on statin therapy, so I don't think that this needs to be added to his pre-operative labs since it was not ordered by Dr. Dion Saucier.)

## 2011-07-03 MED ORDER — CEFAZOLIN SODIUM-DEXTROSE 2-3 GM-% IV SOLR
2.0000 g | INTRAVENOUS | Status: AC
  Administered 2011-07-04: 2 g via INTRAVENOUS
  Filled 2011-07-03: qty 50

## 2011-07-04 ENCOUNTER — Inpatient Hospital Stay (HOSPITAL_COMMUNITY): Payer: 59

## 2011-07-04 ENCOUNTER — Encounter (HOSPITAL_COMMUNITY): Payer: Self-pay | Admitting: Orthopedic Surgery

## 2011-07-04 ENCOUNTER — Encounter (HOSPITAL_COMMUNITY): Payer: Self-pay | Admitting: Vascular Surgery

## 2011-07-04 ENCOUNTER — Inpatient Hospital Stay (HOSPITAL_COMMUNITY)
Admission: RE | Admit: 2011-07-04 | Discharge: 2011-07-07 | DRG: 470 | Disposition: A | Discharge status: Home Health | Payer: 59 | Source: Ambulatory Visit | Attending: Orthopedic Surgery | Admitting: Orthopedic Surgery

## 2011-07-04 ENCOUNTER — Encounter (HOSPITAL_COMMUNITY)
Admission: RE | Disposition: A | Discharge status: Home Health | Payer: Self-pay | Source: Ambulatory Visit | Attending: Orthopedic Surgery

## 2011-07-04 ENCOUNTER — Encounter (HOSPITAL_COMMUNITY): Payer: Self-pay | Admitting: *Deleted

## 2011-07-04 ENCOUNTER — Inpatient Hospital Stay (HOSPITAL_COMMUNITY): Payer: 59 | Admitting: Vascular Surgery

## 2011-07-04 DIAGNOSIS — Z8719 Personal history of other diseases of the digestive system: Secondary | ICD-10-CM

## 2011-07-04 DIAGNOSIS — M1610 Unilateral primary osteoarthritis, unspecified hip: Principal | ICD-10-CM | POA: Diagnosis present

## 2011-07-04 DIAGNOSIS — Z01812 Encounter for preprocedural laboratory examination: Secondary | ICD-10-CM

## 2011-07-04 DIAGNOSIS — Z803 Family history of malignant neoplasm of breast: Secondary | ICD-10-CM

## 2011-07-04 DIAGNOSIS — E039 Hypothyroidism, unspecified: Secondary | ICD-10-CM | POA: Diagnosis present

## 2011-07-04 DIAGNOSIS — M169 Osteoarthritis of hip, unspecified: Principal | ICD-10-CM | POA: Diagnosis present

## 2011-07-04 DIAGNOSIS — I10 Essential (primary) hypertension: Secondary | ICD-10-CM | POA: Diagnosis present

## 2011-07-04 DIAGNOSIS — F17200 Nicotine dependence, unspecified, uncomplicated: Secondary | ICD-10-CM | POA: Diagnosis present

## 2011-07-04 DIAGNOSIS — Q6589 Other specified congenital deformities of hip: Secondary | ICD-10-CM

## 2011-07-04 DIAGNOSIS — M1631 Unilateral osteoarthritis resulting from hip dysplasia, right hip: Secondary | ICD-10-CM | POA: Diagnosis present

## 2011-07-04 DIAGNOSIS — Z808 Family history of malignant neoplasm of other organs or systems: Secondary | ICD-10-CM

## 2011-07-04 HISTORY — DX: Unilateral osteoarthritis resulting from hip dysplasia, right hip: M16.31

## 2011-07-04 HISTORY — PX: TOTAL HIP ARTHROPLASTY: SHX124

## 2011-07-04 SURGERY — ARTHROPLASTY, HIP, TOTAL,POSTERIOR APPROACH
Anesthesia: General | Site: Hip | Laterality: Right | Wound class: Clean

## 2011-07-04 MED ORDER — ACETAMINOPHEN 650 MG RE SUPP: 650.0000 mg | Freq: Four times a day (QID) | RECTAL | Status: DC | PRN

## 2011-07-04 MED ORDER — ZOLPIDEM TARTRATE 5 MG PO TABS
5.0000 mg | ORAL_TABLET | Freq: Every evening | ORAL | Status: DC | PRN
  Filled 2011-07-04 (×2): qty 1

## 2011-07-04 MED ORDER — SODIUM CHLORIDE 0.9 % IR SOLN
Status: DC | PRN
  Administered 2011-07-04: 1000 mL

## 2011-07-04 MED ORDER — METOCLOPRAMIDE HCL 10 MG PO TABS: 5.0000 mg | ORAL_TABLET | Freq: Three times a day (TID) | ORAL | Status: DC | PRN

## 2011-07-04 MED ORDER — LIDOCAINE HCL (CARDIAC) 20 MG/ML IV SOLN
INTRAVENOUS | Status: DC | PRN
  Administered 2011-07-04: 100 mg via INTRAVENOUS

## 2011-07-04 MED ORDER — MORPHINE SULFATE 2 MG/ML IJ SOLN: 4.0000 mg | INTRAMUSCULAR | Status: DC | PRN

## 2011-07-04 MED ORDER — HYDROMORPHONE HCL PF 1 MG/ML IJ SOLN
0.2500 mg | INTRAMUSCULAR | Status: DC | PRN
  Administered 2011-07-04 (×6): 0.5 mg via INTRAVENOUS

## 2011-07-04 MED ORDER — ROCURONIUM BROMIDE 100 MG/10ML IV SOLN
INTRAVENOUS | Status: DC | PRN
  Administered 2011-07-04: 25 mg via INTRAVENOUS
  Administered 2011-07-04: 50 mg via INTRAVENOUS
  Administered 2011-07-04: 15 mg via INTRAVENOUS

## 2011-07-04 MED ORDER — PROPOFOL 10 MG/ML IV EMUL
INTRAVENOUS | Status: DC | PRN
  Administered 2011-07-04: 50 mg via INTRAVENOUS
  Administered 2011-07-04: 150 mg via INTRAVENOUS

## 2011-07-04 MED ORDER — ENOXAPARIN SODIUM 40 MG/0.4ML ~~LOC~~ SOLN: 40.0000 mg | SUBCUTANEOUS | Status: DC

## 2011-07-04 MED ORDER — ONDANSETRON HCL 4 MG/2ML IJ SOLN
INTRAMUSCULAR | Status: DC | PRN
  Administered 2011-07-04: 4 mg via INTRAVENOUS

## 2011-07-04 MED ORDER — HYDROMORPHONE HCL PF 1 MG/ML IJ SOLN
0.5000 mg | INTRAMUSCULAR | Status: DC | PRN
  Administered 2011-07-04 – 2011-07-07 (×12): 1 mg via INTRAVENOUS
  Filled 2011-07-04 (×12): qty 1

## 2011-07-04 MED ORDER — PHENOL 1.4 % MT LIQD: 1.0000 | OROMUCOSAL | Status: DC | PRN

## 2011-07-04 MED ORDER — METHOCARBAMOL 500 MG PO TABS
500.0000 mg | ORAL_TABLET | Freq: Four times a day (QID) | ORAL | Status: DC | PRN
  Administered 2011-07-04 – 2011-07-07 (×7): 500 mg via ORAL
  Filled 2011-07-04 (×8): qty 1

## 2011-07-04 MED ORDER — METHOCARBAMOL 100 MG/ML IJ SOLN
500.0000 mg | Freq: Four times a day (QID) | INTRAVENOUS | Status: DC | PRN
  Administered 2011-07-04: 500 mg via INTRAVENOUS
  Filled 2011-07-04: qty 5

## 2011-07-04 MED ORDER — MEPERIDINE HCL 25 MG/ML IJ SOLN: 6.2500 mg | INTRAMUSCULAR | Status: DC | PRN

## 2011-07-04 MED ORDER — ACETAMINOPHEN 10 MG/ML IV SOLN
INTRAVENOUS | Status: AC
  Filled 2011-07-04: qty 100

## 2011-07-04 MED ORDER — WARFARIN SODIUM 5 MG PO TABS: 5.0000 mg | ORAL_TABLET | Freq: Every day | ORAL | Status: DC

## 2011-07-04 MED ORDER — CEFAZOLIN SODIUM 1-5 GM-% IV SOLN
1.0000 g | Freq: Four times a day (QID) | INTRAVENOUS | Status: AC
  Administered 2011-07-04 – 2011-07-05 (×3): 1 g via INTRAVENOUS
  Filled 2011-07-04 (×3): qty 50

## 2011-07-04 MED ORDER — LEVOTHYROXINE SODIUM 50 MCG PO TABS: 50.0000 ug | ORAL_TABLET | Freq: Every day | ORAL | Status: DC

## 2011-07-04 MED ORDER — OXYCODONE HCL 5 MG PO TABS
5.0000 mg | ORAL_TABLET | ORAL | Status: DC | PRN
  Administered 2011-07-04 – 2011-07-06 (×5): 10 mg via ORAL
  Filled 2011-07-04 (×5): qty 2

## 2011-07-04 MED ORDER — LEVOTHYROXINE SODIUM 125 MCG PO TABS
250.0000 ug | ORAL_TABLET | Freq: Every day | ORAL | Status: DC
  Administered 2011-07-05 – 2011-07-07 (×3): 250 ug via ORAL
  Filled 2011-07-04 (×5): qty 2

## 2011-07-04 MED ORDER — MENTHOL 3 MG MT LOZG: 1.0000 | LOZENGE | OROMUCOSAL | Status: DC | PRN

## 2011-07-04 MED ORDER — ONDANSETRON HCL 4 MG/2ML IJ SOLN: 4.0000 mg | Freq: Once | INTRAMUSCULAR | Status: DC | PRN

## 2011-07-04 MED ORDER — MIDAZOLAM HCL 5 MG/5ML IJ SOLN
INTRAMUSCULAR | Status: DC | PRN
  Administered 2011-07-04: 2 mg via INTRAVENOUS

## 2011-07-04 MED ORDER — NEOSTIGMINE METHYLSULFATE 1 MG/ML IJ SOLN
INTRAMUSCULAR | Status: DC | PRN
  Administered 2011-07-04: 3 mg via INTRAVENOUS

## 2011-07-04 MED ORDER — OXYCODONE-ACETAMINOPHEN 5-325 MG PO TABS
1.0000 | ORAL_TABLET | ORAL | Status: DC | PRN
  Administered 2011-07-05 – 2011-07-06 (×6): 2 via ORAL
  Administered 2011-07-06: 1 via ORAL
  Administered 2011-07-07 (×2): 2 via ORAL
  Filled 2011-07-04 (×11): qty 2

## 2011-07-04 MED ORDER — METHOCARBAMOL 500 MG PO TABS: 500.0000 mg | ORAL_TABLET | Freq: Four times a day (QID) | ORAL | Status: AC

## 2011-07-04 MED ORDER — EPHEDRINE SULFATE 50 MG/ML IJ SOLN
INTRAMUSCULAR | Status: DC | PRN
  Administered 2011-07-04 (×2): 10 mg via INTRAVENOUS

## 2011-07-04 MED ORDER — ONDANSETRON HCL 4 MG PO TABS: 4.0000 mg | ORAL_TABLET | Freq: Four times a day (QID) | ORAL | Status: DC | PRN

## 2011-07-04 MED ORDER — PATIENT'S GUIDE TO USING COUMADIN BOOK
Freq: Once | Status: AC
  Administered 2011-07-04: 20:00:00
  Filled 2011-07-04: qty 1

## 2011-07-04 MED ORDER — POTASSIUM CHLORIDE IN NACL 20-0.45 MEQ/L-% IV SOLN
INTRAVENOUS | Status: DC
  Administered 2011-07-04 – 2011-07-06 (×3): via INTRAVENOUS
  Filled 2011-07-04 (×9): qty 1000

## 2011-07-04 MED ORDER — POTASSIUM CHLORIDE CRYS ER 20 MEQ PO TBCR
20.0000 meq | EXTENDED_RELEASE_TABLET | Freq: Two times a day (BID) | ORAL | Status: DC
  Administered 2011-07-04 – 2011-07-07 (×6): 20 meq via ORAL
  Filled 2011-07-04 (×10): qty 1

## 2011-07-04 MED ORDER — ZOLPIDEM TARTRATE 10 MG PO TABS
10.0000 mg | ORAL_TABLET | Freq: Every day | ORAL | Status: DC
  Administered 2011-07-04 – 2011-07-06 (×3): 10 mg via ORAL
  Filled 2011-07-04 (×2): qty 1

## 2011-07-04 MED ORDER — WARFARIN SODIUM 10 MG PO TABS
10.0000 mg | ORAL_TABLET | Freq: Once | ORAL | Status: AC
  Administered 2011-07-04: 10 mg via ORAL
  Filled 2011-07-04: qty 1

## 2011-07-04 MED ORDER — ACETAMINOPHEN 10 MG/ML IV SOLN
INTRAVENOUS | Status: DC | PRN
  Administered 2011-07-04: 1000 mg via INTRAVENOUS

## 2011-07-04 MED ORDER — LACTATED RINGERS IV SOLN
INTRAVENOUS | Status: DC
  Administered 2011-07-04 (×4): via INTRAVENOUS

## 2011-07-04 MED ORDER — IRBESARTAN 150 MG PO TABS
150.0000 mg | ORAL_TABLET | Freq: Every day | ORAL | Status: DC
  Administered 2011-07-04 – 2011-07-07 (×4): 150 mg via ORAL
  Filled 2011-07-04 (×6): qty 1

## 2011-07-04 MED ORDER — LEVOTHYROXINE SODIUM 200 MCG PO TABS: 200.0000 ug | ORAL_TABLET | Freq: Every day | ORAL | Status: DC

## 2011-07-04 MED ORDER — METOCLOPRAMIDE HCL 5 MG/ML IJ SOLN: 5.0000 mg | Freq: Three times a day (TID) | INTRAMUSCULAR | Status: DC | PRN

## 2011-07-04 MED ORDER — WARFARIN VIDEO
Freq: Once | Status: AC
  Administered 2011-07-04: 20:00:00

## 2011-07-04 MED ORDER — DEXTROSE 5 % IV SOLN
INTRAVENOUS | Status: DC | PRN
  Administered 2011-07-04 (×2): via INTRAVENOUS

## 2011-07-04 MED ORDER — GLYCOPYRROLATE 0.2 MG/ML IJ SOLN
INTRAMUSCULAR | Status: DC | PRN
  Administered 2011-07-04: .4 mg via INTRAVENOUS

## 2011-07-04 MED ORDER — ENOXAPARIN SODIUM 40 MG/0.4ML ~~LOC~~ SOLN
40.0000 mg | SUBCUTANEOUS | Status: DC
  Administered 2011-07-05 – 2011-07-06 (×2): 40 mg via SUBCUTANEOUS
  Filled 2011-07-04 (×2): qty 0.4

## 2011-07-04 MED ORDER — FENTANYL CITRATE 0.05 MG/ML IJ SOLN
INTRAMUSCULAR | Status: DC | PRN
  Administered 2011-07-04: 100 ug via INTRAVENOUS
  Administered 2011-07-04 (×3): 50 ug via INTRAVENOUS
  Administered 2011-07-04: 100 ug via INTRAVENOUS
  Administered 2011-07-04: 50 ug via INTRAVENOUS

## 2011-07-04 MED ORDER — ONDANSETRON HCL 4 MG/2ML IJ SOLN: 4.0000 mg | Freq: Four times a day (QID) | INTRAMUSCULAR | Status: DC | PRN

## 2011-07-04 MED ORDER — ACETAMINOPHEN 325 MG PO TABS: 650.0000 mg | ORAL_TABLET | Freq: Four times a day (QID) | ORAL | Status: DC | PRN

## 2011-07-04 MED ORDER — LIDOCAINE HCL 4 % MT SOLN
OROMUCOSAL | Status: DC | PRN
  Administered 2011-07-04: 4 mL via TOPICAL

## 2011-07-04 MED ORDER — DIPHENHYDRAMINE HCL 12.5 MG/5ML PO ELIX: 12.5000 mg | ORAL_SOLUTION | ORAL | Status: DC | PRN

## 2011-07-04 MED ORDER — OXYCODONE-ACETAMINOPHEN 10-325 MG PO TABS: 1.0000 | ORAL_TABLET | Freq: Four times a day (QID) | ORAL | Status: AC | PRN

## 2011-07-04 SURGICAL SUPPLY — 61 items
APL SKNCLS STERI-STRIP NONHPOA (GAUZE/BANDAGES/DRESSINGS) ×1
BENZOIN TINCTURE PRP APPL 2/3 (GAUZE/BANDAGES/DRESSINGS) ×2 IMPLANT
BLADE SAW SAG 73X25 THK (BLADE) ×1
BLADE SAW SGTL 73X25 THK (BLADE) ×1 IMPLANT
BRUSH FEMORAL CANAL (MISCELLANEOUS) IMPLANT
CLOTH BEACON ORANGE TIMEOUT ST (SAFETY) ×2 IMPLANT
COVER BACK TABLE 24X17X13 BIG (DRAPES) IMPLANT
COVER SURGICAL LIGHT HANDLE (MISCELLANEOUS) ×2 IMPLANT
DRAPE INCISE IOBAN 66X45 STRL (DRAPES) ×1 IMPLANT
DRAPE ORTHO SPLIT 77X108 STRL (DRAPES) ×4
DRAPE PROXIMA HALF (DRAPES) ×2 IMPLANT
DRAPE SURG ORHT 6 SPLT 77X108 (DRAPES) ×2 IMPLANT
DRAPE U-SHAPE 47X51 STRL (DRAPES) ×2 IMPLANT
DRILL BIT 5/64 (BIT) ×2 IMPLANT
DRSG MEPILEX BORDER 4X12 (GAUZE/BANDAGES/DRESSINGS) ×1 IMPLANT
DRSG MEPILEX BORDER 4X8 (GAUZE/BANDAGES/DRESSINGS) IMPLANT
DRSG PAD ABDOMINAL 8X10 ST (GAUZE/BANDAGES/DRESSINGS) ×2 IMPLANT
DURAPREP 26ML APPLICATOR (WOUND CARE) ×2 IMPLANT
ELECT CAUTERY BLADE 6.4 (BLADE) ×2 IMPLANT
ELECT REM PT RETURN 9FT ADLT (ELECTROSURGICAL) ×2
ELECTRODE REM PT RTRN 9FT ADLT (ELECTROSURGICAL) ×1 IMPLANT
EVACUATOR 1/8 PVC DRAIN (DRAIN) IMPLANT
GLOVE BIOGEL PI IND STRL 8 (GLOVE) ×1 IMPLANT
GLOVE BIOGEL PI INDICATOR 8 (GLOVE) ×2
GLOVE ORTHO TXT STRL SZ7.5 (GLOVE) ×3 IMPLANT
GLOVE SURG ORTHO 8.0 STRL STRW (GLOVE) ×4 IMPLANT
GOWN STRL NON-REIN LRG LVL3 (GOWN DISPOSABLE) IMPLANT
HANDPIECE INTERPULSE COAX TIP (DISPOSABLE)
HOOD PEEL AWAY FACE SHEILD DIS (HOOD) ×3 IMPLANT
KIT BASIN OR (CUSTOM PROCEDURE TRAY) ×2 IMPLANT
KIT ROOM TURNOVER OR (KITS) ×2 IMPLANT
MANIFOLD NEPTUNE II (INSTRUMENTS) ×2 IMPLANT
NDL HYPO 25GX1X1/2 BEV (NEEDLE) ×1 IMPLANT
NEEDLE HYPO 25GX1X1/2 BEV (NEEDLE) ×2 IMPLANT
NS IRRIG 1000ML POUR BTL (IV SOLUTION) ×2 IMPLANT
PACK TOTAL JOINT (CUSTOM PROCEDURE TRAY) ×2 IMPLANT
PAD ARMBOARD 7.5X6 YLW CONV (MISCELLANEOUS) ×4 IMPLANT
PILLOW ABDUCTION HIP (SOFTGOODS) ×2 IMPLANT
PRESSURIZER FEMORAL UNIV (MISCELLANEOUS) IMPLANT
RETRIEVER SUT HEWSON (MISCELLANEOUS) ×2 IMPLANT
SET HNDPC FAN SPRY TIP SCT (DISPOSABLE) IMPLANT
SPONGE GAUZE 4X4 12PLY (GAUZE/BANDAGES/DRESSINGS) ×1 IMPLANT
SPONGE LAP 4X18 X RAY DECT (DISPOSABLE) IMPLANT
STRIP CLOSURE SKIN 1/2X4 (GAUZE/BANDAGES/DRESSINGS) ×4 IMPLANT
SUCTION FRAZIER TIP 10 FR DISP (SUCTIONS) ×2 IMPLANT
SUT FIBERWIRE #2 38 REV NDL BL (SUTURE) ×6
SUT FIBERWIRE #2 38 T-5 BLUE (SUTURE)
SUT MNCRL AB 4-0 PS2 18 (SUTURE) IMPLANT
SUT VIC AB 0 CT1 27 (SUTURE) ×4
SUT VIC AB 0 CT1 27XBRD ANBCTR (SUTURE) ×1 IMPLANT
SUT VIC AB 2-0 CT1 27 (SUTURE) ×2
SUT VIC AB 2-0 CT1 TAPERPNT 27 (SUTURE) ×1 IMPLANT
SUT VIC AB 3-0 SH 18 (SUTURE) ×3 IMPLANT
SUTURE FIBERWR #2 38 T-5 BLUE (SUTURE) IMPLANT
SUTURE FIBERWR#2 38 REV NDL BL (SUTURE) ×3 IMPLANT
SYR CONTROL 10ML LL (SYRINGE) ×2 IMPLANT
TOWEL OR 17X24 6PK STRL BLUE (TOWEL DISPOSABLE) ×2 IMPLANT
TOWEL OR 17X26 10 PK STRL BLUE (TOWEL DISPOSABLE) ×2 IMPLANT
TOWER CARTRIDGE SMART MIX (DISPOSABLE) IMPLANT
TRAY FOLEY CATH 14FR (SET/KITS/TRAYS/PACK) ×2 IMPLANT
WATER STERILE IRR 1000ML POUR (IV SOLUTION) ×6 IMPLANT

## 2011-07-04 NOTE — Preoperative (Signed)
Beta Blockers   Reason not to administer Beta Blockers:Not Applicable 

## 2011-07-04 NOTE — Transfer of Care (Signed)
Immediate Anesthesia Transfer of Care Note  Patient: Andrew Love  Procedure(s) Performed: Procedure(s) (LRB): TOTAL HIP ARTHROPLASTY (Right)  Patient Location: PACU  Anesthesia Type: General  Level of Consciousness: awake  Airway & Oxygen Therapy: Patient Spontanous Breathing and Patient connected to nasal cannula oxygen  Post-op Assessment: Report given to PACU RN and Post -op Vital signs reviewed and stable  Post vital signs: stable  Complications: No apparent anesthesia complications

## 2011-07-04 NOTE — H&P (Signed)
PREOPERATIVE H&P  Chief Complaint: DJD RIGHT HIP  HPI: Andrew Love is a 46 y.o. male who presents for preoperative history and physical with a diagnosis of DJD RIGHT HIP. Symptoms are rated as moderate to severe, and have been worsening.  This is significantly impairing activities of daily living.  He has elected for surgical management.   Past Medical History  Diagnosis Date  . CAD (coronary artery disease)   . Elevated liver enzymes   . Diverticulitis     Hx of; requiring 3 admissions  . Sigmoid colon ulcer     Rectal polyps  . Thyroid disease   . Psoriasis     Per medical history form dated 06/13/10.  . Chronic insomnia     Per medical history form dated 06/13/10.  . Colitis     Per medical history from dated 06/13/10.  Marland Kitchen Hypertension   . Hypothyroidism   . Bronchitis     history of  . Back pain     chronic  . Arthritis    Past Surgical History  Procedure Date  . Colonoscopy 07/2008    Colitis,NSAID v. Ischemia,malrotation of the gut,Diverticulosis(L),hyperplastic  . Cardiac catheterization 2009  . Appendectomy   . Shoulder surgery     Left  . Rotator cuff repair     Left - per medical history form dated 06/13/10.  . Cardiac catheterization     Per medical history from dated 06/13/10.  . Thyroidectomy     Per medical history form dated 06/13/10.  . Colon surgery 2012   History   Social History  . Marital Status: Married    Spouse Name: N/A    Number of Children: N/A  . Years of Education: N/A   Social History Main Topics  . Smoking status: Current Everyday Smoker -- 0.2 packs/day for 18 years  . Smokeless tobacco: None   Comment: Has been wearing patch for 20 days  . Alcohol Use: Yes     Occasional  . Drug Use: No  . Sexually Active:    Other Topics Concern  . None   Social History Narrative  . None   Family History  Problem Relation Age of Onset  . Cancer Mother     breast cancer - per medical history form dated 06/13/10.  . Diverticulitis Mother     . Hypertension Mother   . Cancer Father     skin - per medical history form dated 06/13/10.  Marland Kitchen Hypertension Father   . Anesthesia problems Neg Hx   . Hypotension Neg Hx   . Malignant hyperthermia Neg Hx   . Pseudochol deficiency Neg Hx    No Known Allergies Prior to Admission medications   Medication Sig Start Date End Date Taking? Authorizing Provider  Calcium Carbonate-Vit D-Min (CALCIUM 1200 PO) Take 1 tablet by mouth daily.    Yes Historical Provider, MD  fish oil-omega-3 fatty acids 1000 MG capsule Take 2 g by mouth daily.     Yes Historical Provider, MD  levothyroxine (SYNTHROID, LEVOTHROID) 200 MCG tablet Take 200 mcg by mouth daily. Take with 50 mcg to equal 250 mcg daily.   Yes Historical Provider, MD  levothyroxine (SYNTHROID, LEVOTHROID) 50 MCG tablet Take 50 mcg by mouth daily. Take with 200 mcg tablet to equal 250 mcg daily.   Yes Historical Provider, MD  olmesartan (BENICAR) 20 MG tablet Take 20 mg by mouth daily.     Yes Historical Provider, MD  oxyCODONE-acetaminophen (PERCOCET) 5-325 MG per tablet  Take 1 tablet by mouth every 4 (four) hours as needed. As needed for pain.   Yes Historical Provider, MD  potassium chloride SA (K-DUR,KLOR-CON) 20 MEQ tablet Take 20 mEq by mouth 2 (two) times daily.     Yes Historical Provider, MD  promethazine (PHENERGAN) 25 MG tablet Take 25 mg by mouth every 8 (eight) hours as needed. As needed for nausea.    Historical Provider, MD  zolpidem (AMBIEN) 10 MG tablet Take 10 mg by mouth at bedtime.     Historical Provider, MD     Positive ROS: All other systems have been reviewed and were otherwise negative with the exception of those mentioned in the HPI and as above.  Physical Exam: General: Alert, no acute distress Cardiovascular: No pedal edema Respiratory: No cyanosis, no use of accessory musculature GI: No organomegaly, abdomen is soft and non-tender Skin: No lesions in the area of chief complaint Neurologic: Sensation intact  distally Psychiatric: Patient is competent for consent with normal mood and affect Lymphatic: No axillary or cervical lymphadenopathy  MUSCULOSKELETAL: Right hip has severe pain with flexion and internal rotation. EHL and FHL are firing.  Assessment: DJD RIGHT HIP  Plan: Plan for Procedure(s): TOTAL HIP ARTHROPLASTY  The risks benefits and alternatives were discussed with the patient including but not limited to the risks of nonoperative treatment, versus surgical intervention including infection, bleeding, nerve injury, periprosthetic fracture, the need for revision surgery, dislocation, leg length discrepancy, blood clots, cardiopulmonary complications, morbidity, mortality, among others, and they were willing to proceed.  Predicted outcome is good, although there will be at least a six to nine month expected recovery.    Eulas Post, MD 07/04/2011 11:59 AM

## 2011-07-04 NOTE — Anesthesia Preprocedure Evaluation (Addendum)
Anesthesia Evaluation  Patient identified by MRN, date of birth, ID band Patient awake    Reviewed: Allergy & Precautions, H&P , NPO status , Patient's Chart, lab work & pertinent test results  Airway Mallampati: II TM Distance: >3 FB Neck ROM: Full    Dental  (+) Teeth Intact and Dental Advisory Given   Pulmonary Current Smoker,  Trying to Quit   Pulmonary exam normal       Cardiovascular Exercise Tolerance: Poor hypertension, Pt. on medications + CAD Regular Normal    Neuro/Psych  Headaches,    GI/Hepatic PUD,   Endo/Other  Hypothyroidism   Renal/GU      Musculoskeletal  (+) Arthritis -, Osteoarthritis,    Abdominal   Peds  Hematology   Anesthesia Other Findings Crowded mouth  Reproductive/Obstetrics                         Anesthesia Physical Anesthesia Plan  ASA: III  Anesthesia Plan: General   Post-op Pain Management:    Induction: Intravenous  Airway Management Planned: Oral ETT  Additional Equipment:   Intra-op Plan:   Post-operative Plan: Extubation in OR  Informed Consent: I have reviewed the patients History and Physical, chart, labs and discussed the procedure including the risks, benefits and alternatives for the proposed anesthesia with the patient or authorized representative who has indicated his/her understanding and acceptance.   Dental advisory given  Plan Discussed with: CRNA and Surgeon  Anesthesia Plan Comments:        Anesthesia Quick Evaluation

## 2011-07-04 NOTE — Anesthesia Procedure Notes (Signed)
Procedure Name: Intubation Date/Time: 07/04/2011 1:18 PM Performed by: Tyrone Nine Pre-anesthesia Checklist: Patient identified, Emergency Drugs available, Suction available and Patient being monitored Patient Re-evaluated:Patient Re-evaluated prior to inductionOxygen Delivery Method: Circle system utilized Preoxygenation: Pre-oxygenation with 100% oxygen Intubation Type: IV induction Ventilation: Mask ventilation without difficulty Laryngoscope Size: Mac and 3 Grade View: Grade III Tube type: Oral Tube size: 7.5 mm Number of attempts: 2 Airway Equipment and Method: Stylet Placement Confirmation: ETT inserted through vocal cords under direct vision and breath sounds checked- equal and bilateral Secured at: 22 cm Tube secured with: Tape Dental Injury: Teeth and Oropharynx as per pre-operative assessment  Comments: 1 st  laryngoscopy to stomach.  ID'd immediately removed, successful w/ 2nd attempt.

## 2011-07-04 NOTE — Progress Notes (Signed)
ANTICOAGULATION CONSULT NOTE - Initial Consult  Pharmacy Consult for Coumadin Indication: VTE prophylaxis  No Known Allergies  Vital Signs: Temp: 98.9 F (37.2 C) (02/26 1545) Temp src: Oral (02/26 1048) BP: 114/72 mmHg (02/26 1720) Pulse Rate: 74  (02/26 1715)  Labs: No results found for this basename: HGB:2,HCT:3,PLT:3,APTT:3,LABPROT:3,INR:3,HEPARINUNFRC:3,CREATININE:3,CKTOTAL:3,CKMB:3,TROPONINI:3 in the last 72 hours The CrCl is unknown because both a height and weight (above a minimum accepted value) are required for this calculation.  Medical History: Past Medical History  Diagnosis Date  . CAD (coronary artery disease)   . Elevated liver enzymes   . Diverticulitis     Hx of; requiring 3 admissions  . Sigmoid colon ulcer     Rectal polyps  . Thyroid disease   . Psoriasis     Per medical history form dated 06/13/10.  . Chronic insomnia     Per medical history form dated 06/13/10.  . Colitis     Per medical history from dated 06/13/10.  Marland Kitchen Hypertension   . Hypothyroidism   . Bronchitis     history of  . Back pain     chronic  . Arthritis   . 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  ceFAZolin (ANCEF) IV  1 g Intravenous Q6H  .  ceFAZolin (ANCEF) IV  2 g Intravenous 60 min Pre-Op  . enoxaparin  40 mg Subcutaneous Q24H  . irbesartan  150 mg Oral Daily  . levothyroxine  250 mcg Oral QAC breakfast  . potassium chloride SA  20 mEq Oral BID  . zolpidem  10 mg Oral QHS  . DISCONTD: levothyroxine  200 mcg Oral Daily  . DISCONTD: levothyroxine  50 mcg Oral Daily    Assessment: 45 YOM s/p right total hip arthroplasty to start coumadin for VTE prophylaxis. Pt. Wasn't on anticoagulants prior to surgery, baseline INR 1.01 (2/20), currently also on lovenox 40mg  sq q 24hrs (start from 2/27).  Goal of Therapy:  INR 2-3   Plan:  - Coumadin 10mg  po x 1 - f/u INR in Am - coumadin book and video -d/c lovenox when INR >  2  Riki Rusk 07/04/2011,6:47 PM

## 2011-07-04 NOTE — Anesthesia Postprocedure Evaluation (Signed)
  Anesthesia Post-op Note  Patient: Andrew Love  Procedure(s) Performed: Procedure(s) (LRB): TOTAL HIP ARTHROPLASTY (Right)  Patient Location: PACU  Anesthesia Type: General  Level of Consciousness: awake, oriented, sedated and patient cooperative  Airway and Oxygen Therapy: Patient Spontanous Breathing and Patient connected to nasal cannula oxygen  Post-op Pain: mild  Post-op Assessment: Post-op Vital signs reviewed, Patient's Cardiovascular Status Stable, Respiratory Function Stable, Patent Airway, No signs of Nausea or vomiting and Pain level controlled  Post-op Vital Signs: stable  Complications: No apparent anesthesia complications

## 2011-07-04 NOTE — Op Note (Signed)
07/04/2011  3:17 PM  PATIENT:  Andrew Love   MRN: [id number]  PRE-OPERATIVE DIAGNOSIS:  degenerative joint disease  RIGHT HIP  POST-OPERATIVE DIAGNOSIS:  degenerative joint disease  RIGHT HIP  PROCEDURE:  Procedure(s): TOTAL HIP ARTHROPLASTY  PREOPERATIVE INDICATIONS:    Andrew Love is an 46 y.o. 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  The risks benefits and alternatives were discussed with the patient including but not limited to the risks of nonoperative treatment, versus surgical intervention including infection, bleeding, nerve injury, periprosthetic fracture, the need for revision surgery, dislocation, leg length discrepancy, blood clots, cardiopulmonary complications, morbidity, mortality, among others, and they were willing to proceed.  Predicted outcome is good, although there will be at least a six to nine month expected recovery.    OPERATIVE REPORT     SURGEON:  Teryl Lucy, MD    ASSISTANT:  Janace Litten, OPA-C  (Present throughout the entire procedure,  necessary for completion of procedure in a timely manner, assisting with retraction, instrumentation, and closure)     ANESTHESIA:  General    COMPLICATIONS:  None.     COMPONENTS:  [employer] fit femur size 5 with a 36 mm head ball and a pinnacle acetabular shell size 54 with a 10 degree lipped polyethylene liner    PROCEDURE IN DETAIL:   The patient was met in the holding area and  identified.  The appropriate hip was identified and marked at the operative site.  The patient was then transported to the OR  and  placed under general l anesthesia.  At that point, the patient was  placed in the lateral decubitus position with the operative side up and  secured to the operating room table and all bony prominences padded.     The operative lower extremity was prepped from the iliac crest to  the distal leg.  Sterile draping was performed.  Time out was performed prior to incision.      A routine posterolateral approach was utilized via sharp dissection  carried down to the subcutaneous tissue.  Gross bleeders were Bovie coagulated.  The iliotibial band was identified and incised along the length of the skin incision.  Self-retaining retractors were  inserted.  With the hip internally rotated, the short external rotators  were identified. The piriformis and capsule was tagged with FiberWire, and the hip capsule released in a T-type fashion.  The femoral neck was exposed, and I resected the femoral neck using the appropriate jig. This was performed at approximately a thumb's breadth above the lesser trochanter. His proximal anatomy was extremely tight secondary to his dysplasia.    I then exposed the deep acetabulum, cleared out any tissue including the ligamentum teres.  A wing retractor was placed.  After adequate visualization, I excised the labrum, and then sequentially reamed.  I placed the trial acetabulum, which seated nicely, and then impacted the real cup into place.  Appropriate version and inclination was confirmed clinically matching their bony anatomy, and also with the use of the jig.  A trial polyethylene liner was placed and the wing retractor removed.    I then prepared the proximal femur using the cookie-cutter, the lateralizing reamer, and then sequentially reamed and broached.  A trial broach, neck, and head was utilized, and I reduced the hip and it was found to have excellent stability with functional range of motion. The trial components  were then removed, and the real polyethylene liner was placed with the lip directed posteriorly.  I then impacted the real femoral prosthesis into place into the appropriate version, slightly anteverted to the normal anatomy, and I impacted the real head ball into place. The hip was then reduced and taken through functional range of  motion and found to have excellent stability. Leg lengths were restored.  I then used a 2 mm drill bits to pass the FiberWire suture from the capsule and puriform is through the greater trochanter, and secured this. Excellent posterior capsular repair was achieved. I also closed the T in the capsule.  I then irrigated the hip copiously again with pulse lavage, and repaired the fascia with Vicryl, followed by Vicryl for the subcutaneous tissue, Monocryl for the skin, Steri-Strips and sterile gauze. The wounds were injected. The patient was then awakened and returned to PACU in stable and satisfactory condition. No complications.  Teryl Lucy, MD Orthopedic Surgeon [phone]   07/04/2011 3:17 PM

## 2011-07-05 LAB — BASIC METABOLIC PANEL
BUN: 7 mg/dL (ref 6–23)
CO2: 26 mEq/L (ref 19–32)
Calcium: 9 mg/dL (ref 8.4–10.5)
Chloride: 99 mEq/L (ref 96–112)
Creatinine, Ser: 0.99 mg/dL (ref 0.50–1.35)
GFR calc Af Amer: >90 mL/min (ref >90)
GFR calc non Af Amer: >90 mL/min (ref >90)
Glucose, Bld: 118 mg/dL — ABNORMAL HIGH (ref 70–99)
Potassium: 3.8 mEq/L (ref 3.5–5.1)
Sodium: 135 mEq/L (ref 135–145)

## 2011-07-05 LAB — CBC
HCT: 40 % (ref 39.0–52.0)
Hemoglobin: 13.8 g/dL (ref 13.0–17.0)
MCH: 32.6 pg (ref 26.0–34.0)
MCHC: 34.5 g/dL (ref 30.0–36.0)
MCV: 94.6 fL (ref 78.0–100.0)
Platelets: 244 10*3/uL (ref 150–400)
RBC: 4.23 MIL/uL (ref 4.22–5.81)
RDW: 12.7 % (ref 11.5–15.5)
WBC: 12.3 10*3/uL — ABNORMAL HIGH (ref 4.0–10.5)

## 2011-07-05 LAB — PROTIME-INR
INR: 1.11 (ref 0.00–1.49)
Prothrombin Time: 14.5 seconds (ref 11.6–15.2)

## 2011-07-05 MED ORDER — WARFARIN SODIUM 7.5 MG PO TABS
7.5000 mg | ORAL_TABLET | Freq: Once | ORAL | Status: AC
  Administered 2011-07-05: 7.5 mg via ORAL
  Filled 2011-07-05: qty 1

## 2011-07-05 MED ORDER — WARFARIN - PHARMACIST DOSING INPATIENT
Freq: Every day | Status: DC
  Filled 2011-07-05 (×2): qty 1

## 2011-07-05 MED FILL — Hydromorphone HCl Inj 1 MG/ML: INTRAMUSCULAR | Qty: 1 | Status: AC

## 2011-07-05 NOTE — Progress Notes (Signed)
Subjective: 1 Day Post-Op Procedure(s) (LRB): TOTAL HIP ARTHROPLASTY (Right) Patient reports pain as moderate.   Patient states that he is doing Ok but is hurting more than he thought he would.  Objective: Vital signs in last 24 hours: Temp:  [97.7 F (36.5 C)-99.3 F (37.4 C)] 99.3 F (37.4 C) (02/27 0558) Pulse Rate:  [56-89] 89  (02/27 0558) Resp:  [10-26] 20  (02/27 0558) BP: (114-143)/(71-88) 142/88 mmHg (02/27 0558) SpO2:  [92 %-100 %] 93 % (02/27 0558) Weight:  [107.049 kg (236 lb)] 107.049 kg (236 lb) (02/26 2000)  Intake/Output from previous day: 02/26 0701 - 02/27 0700 In: 2250 [I.V.:2250] Out: 2825 [Urine:2625; Blood:200] Intake/Output this shift:     Basename 07/05/11 0620  HGB 13.8    Basename 07/05/11 0620  WBC 12.3*  RBC 4.23  HCT 40.0  PLT 244    Basename 07/05/11 0620  NA 135  K 3.8  CL 99  CO2 26  BUN 7  CREATININE 0.99  GLUCOSE 118*  CALCIUM 9.0    Basename 07/05/11 0620  LABPT --  INR 1.11    Sensation intact distally Intact pulses distally Dorsiflexion/Plantar flexion intact Incision: dressing C/D/I and scant drainage  Assessment/Plan: 1 Day Post-Op Procedure(s) (LRB): TOTAL HIP ARTHROPLASTY (Right) Advance diet Up with therapy Continue with current pain regimen.   Plan DC home on Thurs. Or Friday.  Torrie Mayers, BRANDON 07/05/2011, 8:21 AM

## 2011-07-05 NOTE — Progress Notes (Addendum)
ANTICOAGULATION CONSULT NOTE - Initial Consult  Pharmacy Consult for Coumadin Indication: VTE prophylaxis  No Known Allergies  Vital Signs: Temp: 99.3 F (37.4 C) (02/27 0558) BP: 142/88 mmHg (02/27 0558) Pulse Rate: 89  (02/27 0558)  Labs:  Alvira Philips 07/05/11 0620  HGB 13.8  HCT 40.0  PLT 244  APTT --  LABPROT 14.5  INR 1.11  HEPARINUNFRC --  CREATININE 0.99  CKTOTAL --  CKMB --  TROPONINI --   Estimated Creatinine Clearance: 122.2 ml/min (by C-G formula based on Cr of 0.99).  Medical History: Past Medical History  Diagnosis Date  . CAD (coronary artery disease)   . Elevated liver enzymes   . Diverticulitis     Hx of; requiring 3 admissions  . Sigmoid colon ulcer     Rectal polyps  . Thyroid disease   . Psoriasis     Per medical history form dated 06/13/10.  . Chronic insomnia     Per medical history form dated 06/13/10.  . Colitis     Per medical history from dated 06/13/10.  Marland Kitchen Hypertension   . Hypothyroidism   . Bronchitis     history of  . Back pain     chronic  . Arthritis   . 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  ceFAZolin (ANCEF) IV  1 g Intravenous Q6H  .  ceFAZolin (ANCEF) IV  2 g Intravenous 60 min Pre-Op  . enoxaparin  40 mg Subcutaneous Q24H  . irbesartan  150 mg Oral Daily  . levothyroxine  250 mcg Oral QAC breakfast  . patient's guide to using coumadin book   Does not apply Once  . potassium chloride SA  20 mEq Oral BID  . warfarin  10 mg Oral ONCE-1800  . warfarin   Does not apply Once  . zolpidem  10 mg Oral QHS  . DISCONTD: levothyroxine  200 mcg Oral Daily  . DISCONTD: levothyroxine  50 mcg Oral Daily    Assessment: 45 YOM s/p right total hip arthroplasty on coumadin for VTE prophylaxis. Pt. Wasn't on anticoagulants prior to surgery, baseline INR 1.01 (2/20), currently also on lovenox 40mg  sq q 24hrs (start from 2/27).  Goal of Therapy:  INR 2-3   Plan:  - Coumadin 7.5  mg po x 1 - f/u INR -d/c lovenox when INR > 2  Kush Farabee Poteet 07/05/2011,11:02 AM

## 2011-07-05 NOTE — Progress Notes (Signed)
Physical Therapy Progress Note   07/05/11 1609  PT Visit Information  Last PT Received On 07/05/11  Precautions  Precautions Posterior Hip  Precaution Booklet Issued Yes (comment)  Restrictions  RLE Weight Bearing WBAT  Bed Mobility  Supine to Sit 4: Min assist;With rails (guard assist)  Supine to Sit Details (indicate cue type and reason) Verbal cues for technique and post hip prec; Must be careful to watch for hip internal rotation  Sit to Supine 4: Min assist;With rail;HOB flat  Sit to Supine - Details (indicate cue type and reason) Cues for technique an dhip prec  Transfers  Sit to Stand 4: Min assist;From bed (Guard assist)  Sit to Stand Details (indicate cue type and reason) cues for post hipprec and hand placement  Stand to Sit 4: Min assist;To bed;With upper extremity assist  Stand to Sit Details Cues for safety, hand placement  Ambulation/Gait  Ambulation/Gait Yes  Ambulation/Gait Assistance 4: Min assist (guard assist)  Ambulation/Gait Assistance Details (indicate cue type and reason) cues for sequence and to watch post hip prec with turns; much improved step length and gait efficiency   Ambulation Distance (Feet) 35 Feet  Assistive device Rolling walker  Gait Pattern Step-to pattern  PT - End of Session  Equipment Utilized During Treatment Gait belt  Activity Tolerance Patient limited by pain  Patient left in bed;with call bell in reach;with family/visitor present  General  Behavior During Session [facility] for tasks performed  Cognition [facility] for tasks performed  PT - Assessment/Plan  Comments on Treatment Session Pt very motivated to move better; good effort to move, transfer, amb without physical assist; definitely beneficial to premedicate for pain  PT Plan Discharge plan remains appropriate  PT Frequency 7X/week  Follow Up Recommendations Home health PT;Supervision/Assistance - 24 hour  Equipment Recommended Rolling walker with 5" wheels (Already has 3in1; may have  RW; must verify)  Acute Rehab PT Goals  Time For Goal Achievement 7 days  Pt will go Supine/Side to Sit with supervision  PT Goal: Supine/Side to Sit - Progress Progressing toward goal  Pt will go Sit to Supine/Side with supervision  PT Goal: Sit to Supine/Side - Progress Progressing toward goal  Pt will go Sit to Stand with supervision  PT Goal: Sit to Stand - Progress Progressing toward goal  Pt will go Stand to Sit with supervision  PT Goal: Stand to Sit - Progress Progressing toward goal  Pt will Ambulate >150 feet;with supervision;with rolling walker  PT Goal: Ambulate - Progress Progressing toward goal  Pt will Go Up / Down Stairs 3-5 stairs;with rail(s);with min assist  PT Goal: Up/Down Stairs - Progress Other (comment)  Pt will Perform Home Exercise Program Independently  PT Goal: Perform Home Exercise Program - Progress Other (comment)  Additional Goals  Additional Goal #1 Pt will verbalize and adhere to Post Hip Prec  PT Goal: Additional Goal #1 - Progress Progressing toward goal  Additional Goal #2 Pt wil ascend/descend flight of steps with min assist and rails (dc home is NOT contingent on this goal being met)  PT Goal: Additional Goal #2 - Progress Other (comment)    Van Clines, PT [phone]

## 2011-07-05 NOTE — Evaluation (Signed)
Occupational Therapy Evaluation Patient Details Name: Andrew Love MRN: [id number] DOB: 09/03/1965 Today's Date: 07/05/2011  Problem List:  Patient Active Problem List  Diagnoses  . RECTAL BLEEDING  . HEPATITIS  . DIARRHEA  . ABDOMINAL PAIN, LEFT LOWER QUADRANT  . LIVER FUNCTION TESTS, ABNORMAL, HX OF  . DIVERTICULITIS, HX OF  . Fever  . Night sweats  . Chills  . Insomnia  . High blood pressure  . Poor circulation  . Cough with sputum  . High cholesterol  . Thyroid disease  . Bruises easily/Rash  . Nausea vomiting and diarrhea  . Poor appetite  . Dizziness  . Weakness  . Fainting  . Generalized headaches  . Ear infection  . Sinus problem/runny nose  . Hoarseness/sore throat  . Wears glasses  . Arthritis  . 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 CAD (coronary artery disease)   . Elevated liver enzymes   . Diverticulitis     Hx of; requiring 3 admissions  . Sigmoid colon ulcer     Rectal polyps  . Thyroid disease   . Psoriasis     Per medical history form dated 06/13/10.  . Chronic insomnia     Per medical history form dated 06/13/10.  . Colitis     Per medical history from dated 06/13/10.  Marland Kitchen Hypertension   . Hypothyroidism   . Bronchitis     history of  . Back pain     chronic  . Arthritis   . 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 Colonoscopy 07/2008    Colitis,NSAID v. Ischemia,malrotation of the gut,Diverticulosis(L),hyperplastic  . Cardiac catheterization 2009  . Appendectomy   . Shoulder surgery     Left  . Rotator cuff repair     Left - per medical history form dated 06/13/10.  . Cardiac catheterization     Per medical history from dated 06/13/10.  . Thyroidectomy     Per medical history form dated 06/13/10.  . Colon surgery 2012    OT Assessment/Plan/Recommendation OT  Assessment Clinical Impression Statement: Pt. presents s/p rt. THA and with increased pain. Pt. will benefit from skilled OT to increase functional level of independence to supervision level at D/C. OT Recommendation/Assessment: Patient will need skilled OT in the acute care venue OT Problem List: Decreased strength;Decreased activity tolerance;Decreased knowledge of use of DME or AE;Decreased knowledge of precautions;Pain Barriers to Discharge: None OT Therapy Diagnosis : Acute pain OT Plan OT Frequency: Min 2X/week OT Treatment/Interventions: Self-care/ADL training;DME and/or AE instruction;Patient/family education OT Recommendation Follow Up Recommendations: Home health OT;Supervision - Intermittent Equipment Recommended: Rolling walker with 5" wheels;3 in 1 bedside comode Individuals Consulted Consulted and Agree with Results and Recommendations: Patient OT Goals Acute Rehab OT Goals OT Goal Formulation: With patient Time For Goal Achievement: 7 days ADL Goals Pt Will Perform Lower Body Bathing: with set-up;with supervision;with adaptive equipment;Sit to stand from bed ADL Goal: Lower Body Bathing - Progress: Goal set today Pt Will Perform Lower Body Dressing: with set-up;with supervision;Sit to stand from bed;with adaptive equipment ADL Goal: Lower Body Dressing - Progress: Goal set today Pt Will Transfer to Toilet: with supervision;Ambulation;with DME;3-in-1;Maintaining hip precautions ADL Goal: Toilet Transfer - Progress: Goal set today Additional ADL Goal #1: Pt. will recall 3/3 hip precautions with ADLs. ADL Goal: Additional Goal #1 - Progress: Goal  set today  OT Evaluation Precautions/Restrictions  Precautions Precautions: Posterior Hip Precaution Booklet Issued: Yes (comment) Restrictions Weight Bearing Restrictions: Yes RLE Weight Bearing: Weight bearing as tolerated Prior Functioning Home Living Lives With: Spouse Receives Help From: Family Type of Home:  House Home Layout: Two level;Able to live on main level with bedroom/bathroom;1/2 bath on main level Alternate Level Stairs-Rails: Left Alternate Level Stairs-Number of Steps: 15 Home Access: Stairs to enter Entrance Stairs-Rails: Right;Left Entrance Stairs-Number of Steps: 2 Bathroom Accessibility: Yes How Accessible: Accessible via walker Home Adaptive Equipment: None Additional Comments: Will need to verify home adaptive equipment Prior Function Level of Independence: Independent with basic ADLs Able to Take Stairs?: Yes Driving: Yes ADL ADL Eating/Feeding: Performed;Independent Where Assessed - Eating/Feeding: Chair Grooming: Wash/dry hands;Simulated;Set up;Minimal assistance Grooming Details (indicate cue type and reason): Mod verbal cues for hand placement on RW Where Assessed - Grooming: Standing at sink Upper Body Bathing: Simulated;Chest;Right arm;Left arm;Abdomen;Set up Where Assessed - Upper Body Bathing: Sitting, chair Lower Body Bathing: Simulated;Maximal assistance Lower Body Bathing Details (indicate cue type and reason): Educated pt. on using long handled sponge to complete task Where Assessed - Lower Body Bathing: Sit to stand from bed Upper Body Dressing: Simulated;Set up Upper Body Dressing Details (indicate cue type and reason): with donning gown Where Assessed - Upper Body Dressing: Sitting, bed Lower Body Dressing: Simulated;Maximal assistance Lower Body Dressing Details (indicate cue type and reason): For donning rt. sock and pt. unable to bend left Le to reach sock Where Assessed - Lower Body Dressing: Sitting, bed Toilet Transfer: Simulated;Minimal assistance Toilet Transfer Details (indicate cue type and reason): Min verbal cues for hand placement and technique Toilet Transfer Method: Ambulating Toilet Transfer Equipment: Raised toilet seat with arms (or 3-in-1 over toilet) Toileting - Clothing Manipulation: Simulated;Set up Where Assessed - Toileting  Clothing Manipulation: Sit to stand from 3-in-1 or toilet Toileting - Hygiene: Simulated;Set up Where Assessed - Toileting Hygiene: Sit to stand from 3-in-1 or toilet Tub/Shower Transfer: Not assessed Ambulation Related to ADLs: Pt. min assist for ~25' with RW and mod verbal cues to maintain hip precautions  ADL Comments: Pt. educated on techniques for completing LB ADLs with use of AE  Cognition Cognition Arousal/Alertness: Awake/alert Overall Cognitive Status: Appears within functional limits for tasks assessed Orientation Level: Oriented X4 Sensation/Coordination Sensation Light Touch: Appears Intact Coordination Gross Motor Movements are Fluid and Coordinated: No Coordination and Movement Description: Limited by pain Extremity Assessment RUE Assessment RUE Assessment: Within Functional Limits LUE Assessment LUE Assessment: Within Functional Limits Mobility  Bed Mobility Bed Mobility: Yes Supine to Sit: 3: Mod assist;With rails Supine to Sit Details (indicate cue type and reason): Mod verbal cues for technique and maintaining hip precautions Transfers Transfers: Yes Sit to Stand: 4: Min assist;From bed Sit to Stand Details (indicate cue type and reason): cues for post hip prec and technique Stand to Sit: 4: Min assist;To chair/3-in-1;With upper extremity assist Stand to Sit Details: cues for RLE positioning for Post Hip Prec, for hand placement   End of Session OT - End of Session Equipment Utilized During Treatment: Gait belt Activity Tolerance: Patient tolerated treatment well Patient left: in bed;with call bell in reach Nurse Communication: Mobility status for transfers General Behavior During Session: [facility] for tasks performed Cognition: [facility] for tasks performed   Brenisha Tsui, OTR/L Pager [phone] 07/05/2011, 3:26 PM

## 2011-07-05 NOTE — Progress Notes (Signed)
Physical Therapy Evaluation Patient Details Name: Andrew Love MRN: [id number] DOB: Mar 03, 1966 Today's Date: 07/05/2011  Problem List:  Patient Active Problem List  Diagnoses  . RECTAL BLEEDING  . HEPATITIS  . DIARRHEA  . ABDOMINAL PAIN, LEFT LOWER QUADRANT  . LIVER FUNCTION TESTS, ABNORMAL, HX OF  . DIVERTICULITIS, HX OF  . Fever  . Night sweats  . Chills  . Insomnia  . High blood pressure  . Poor circulation  . Cough with sputum  . High cholesterol  . Thyroid disease  . Bruises easily/Rash  . Nausea vomiting and diarrhea  . Poor appetite  . Dizziness  . Weakness  . Fainting  . Generalized headaches  . Ear infection  . Sinus problem/runny nose  . Hoarseness/sore throat  . Wears glasses  . Arthritis  . 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 CAD (coronary artery disease)   . Elevated liver enzymes   . Diverticulitis     Hx of; requiring 3 admissions  . Sigmoid colon ulcer     Rectal polyps  . Thyroid disease   . Psoriasis     Per medical history form dated 06/13/10.  . Chronic insomnia     Per medical history form dated 06/13/10.  . Colitis     Per medical history from dated 06/13/10.  Marland Kitchen Hypertension   . Hypothyroidism   . Bronchitis     history of  . Back pain     chronic  . Arthritis   . 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 Colonoscopy 07/2008    Colitis,NSAID v. Ischemia,malrotation of the gut,Diverticulosis(L),hyperplastic  . Cardiac catheterization 2009  . Appendectomy   . Shoulder surgery     Left  . Rotator cuff repair     Left - per medical history form dated 06/13/10.  . Cardiac catheterization     Per medical history from dated 06/13/10.  . Thyroidectomy     Per medical history form dated 06/13/10.  . Colon surgery 2012    PT Assessment/Plan/Recommendation PT  Assessment Clinical Impression Statement: 46 yo male s/p RTHA presents with decreased functional mobility; will benefit from PT to maximize independence and safety with mobility and enable safe dc home PT Recommendation/Assessment: Patient will need skilled PT in the acute care venue PT Problem List: Decreased strength;Decreased range of motion;Decreased activity tolerance;Decreased mobility;Decreased knowledge of use of DME;Decreased knowledge of precautions;Pain PT Therapy Diagnosis : Difficulty walking;Acute pain PT Plan PT Frequency: 7X/week PT Treatment/Interventions: DME instruction;Gait training;Stair training;Functional mobility training;Therapeutic activities;Therapeutic exercise;Patient/family education PT Recommendation Recommendations for Other Services: OT consult Follow Up Recommendations: Home health PT;Supervision/Assistance - 24 hour Equipment Recommended: Rolling walker with 5" wheels;3 in 1 bedside comode PT Goals  Acute Rehab PT Goals PT Goal Formulation: With patient Time For Goal Achievement: 7 days Pt will go Supine/Side to Sit: with supervision PT Goal: Supine/Side to Sit - Progress: Goal set today Pt will go Sit to Supine/Side: with supervision PT Goal: Sit to Supine/Side - Progress: Goal set today Pt will go Sit to Stand: with supervision PT Goal: Sit to Stand - Progress: Goal set today Pt will go Stand to Sit: with supervision PT Goal: Stand to Sit - Progress: Goal set today Pt will Ambulate: >150 feet;with supervision;with rolling walker PT Goal: Ambulate - Progress: Goal set today Pt will Go Up /  Down Stairs: 3-5 stairs;with rail(s);with min assist PT Goal: Up/Down Stairs - Progress: Goal set today Pt will Perform Home Exercise Program: Independently PT Goal: Perform Home Exercise Program - Progress: Goal set today Additional Goals Additional Goal #1: Pt will verbalize and adhere to Post Hip Prec PT Goal: Additional Goal #1 - Progress: Goal set  today Additional Goal #2: Pt wil ascend/descend flight of steps with min assist and rails (dc home is NOT contingent on this goal being met) PT Goal: Additional Goal #2 - Progress: Goal set today  PT Evaluation Precautions/Restrictions  Precautions Precautions: Posterior Hip Precaution Booklet Issued: Yes (comment) Restrictions Weight Bearing Restrictions: Yes RLE Weight Bearing: Weight bearing as tolerated Prior Functioning  Home Living Lives With: Spouse Receives Help From: Family Type of Home: House Home Layout: Two level;Able to live on main level with bedroom/bathroom;1/2 bath on main level Alternate Level Stairs-Rails: Left Alternate Level Stairs-Number of Steps: 15 Home Access: Stairs to enter Entrance Stairs-Rails: Right;Left Entrance Stairs-Number of Steps: 2 Bathroom Accessibility: Yes How Accessible: Accessible via walker Home Adaptive Equipment: None Additional Comments: Will need to verify home adaptive equipment Prior Function Level of Independence: Independent with basic ADLs Able to Take Stairs?: Yes Driving: Yes Cognition Cognition Arousal/Alertness: Awake/alert Overall Cognitive Status: Appears within functional limits for tasks assessed Orientation Level: Oriented X4 Sensation/Coordination Sensation Light Touch: Appears Intact Coordination Gross Motor Movements are Fluid and Coordinated: No Coordination and Movement Description: Limited by pain Extremity Assessment RUE Assessment RUE Assessment: Within Functional Limits LUE Assessment LUE Assessment: Within Functional Limits RLE Assessment RLE Assessment: Exceptions to [facility] RLE Strength RLE Overall Strength Comments: Grossly decr AROM and strength, limited by pain LLE Assessment LLE Assessment: Within Functional Limits Mobility (including Balance) Bed Mobility Bed Mobility: Yes Supine to Sit: 3: Mod assist;With rails Supine to Sit Details (indicate cue type and reason): Step-by-step cues for  technique, post hip prec; very slow moving and painful Transfers Transfers: Yes Sit to Stand: 4: Min assist;From bed Sit to Stand Details (indicate cue type and reason): cues for post hip prec and technique Stand to Sit: 4: Min assist;To chair/3-in-1;With upper extremity assist Stand to Sit Details: cues for RLE positioning for Post Hip Prec, for hand placement Ambulation/Gait Ambulation/Gait: Yes Ambulation/Gait Assistance: 4: Min assist (second person for IV, chair pull behind) Ambulation/Gait Assistance Details (indicate cue type and reason): cues for gait sequence, and for Post Hip Prec; very short steps, very painful, but not requiring much assist Ambulation Distance (Feet): 5 Feet Assistive device: Rolling walker Gait Pattern: Step-to pattern    Exercise  Total Joint Exercises Quad Sets: AROM;Right;5 reps;Supine Gluteal Sets: AROM;Both;5 reps;Supine Heel Slides: AAROM;Right;5 reps;Supine Hip ABduction/ADduction: AAROM;Right;5 reps;Supine End of Session PT - End of Session Activity Tolerance: Patient limited by pain Patient left: in chair;with call bell in reach;with family/visitor present Nurse Communication: Mobility status for transfers General Behavior During Session: [facility] for tasks performed Cognition:  (very internally distracted by pain)  Olen Pel [place], [place] [phone]   07/05/2011, 12:51 PM

## 2011-07-06 LAB — CBC
HCT: 40 % (ref 39.0–52.0)
Hemoglobin: 13.7 g/dL (ref 13.0–17.0)
MCH: 32.8 pg (ref 26.0–34.0)
MCHC: 34.3 g/dL (ref 30.0–36.0)
MCV: 95.7 fL (ref 78.0–100.0)
Platelets: 203 10*3/uL (ref 150–400)
RBC: 4.18 MIL/uL — ABNORMAL LOW (ref 4.22–5.81)
RDW: 13 % (ref 11.5–15.5)
WBC: 11.6 10*3/uL — ABNORMAL HIGH (ref 4.0–10.5)

## 2011-07-06 LAB — BASIC METABOLIC PANEL
BUN: 9 mg/dL (ref 6–23)
CO2: 25 mEq/L (ref 19–32)
Calcium: 9 mg/dL (ref 8.4–10.5)
Chloride: 98 mEq/L (ref 96–112)
Creatinine, Ser: 0.98 mg/dL (ref 0.50–1.35)
GFR calc Af Amer: >90 mL/min (ref >90)
GFR calc non Af Amer: >90 mL/min (ref >90)
Glucose, Bld: 112 mg/dL — ABNORMAL HIGH (ref 70–99)
Potassium: 4.1 mEq/L (ref 3.5–5.1)
Sodium: 132 mEq/L — ABNORMAL LOW (ref 135–145)

## 2011-07-06 LAB — PROTIME-INR
INR: 2.76 — ABNORMAL HIGH (ref 0.00–1.49)
Prothrombin Time: 29.6 seconds — ABNORMAL HIGH (ref 11.6–15.2)

## 2011-07-06 MED ORDER — ENOXAPARIN SODIUM 40 MG/0.4ML ~~LOC~~ SOLN: 40.0000 mg | SUBCUTANEOUS | Status: DC

## 2011-07-06 NOTE — Progress Notes (Signed)
Physical Therapy Treatment Patient Details Name: Andrew Love MRN: [id number] DOB: 05/24/1965 Today's Date: 07/06/2011  PT Assessment/Plan  PT - Assessment/Plan Comments on Treatment Session: Pt progressing with PT goals.  Increased ambulation distance.  C/o significant amount of pain in Rt hip with activity.  Pt states anticipated d/c day is Friday (2/29/13)- will need to perform stairs before d/cing home.   PT Plan: Discharge plan remains appropriate PT Frequency: 7X/week Follow Up Recommendations: Home health PT;Supervision/Assistance - 24 hour Equipment Recommended: Rolling walker with 5" wheels PT Goals  Acute Rehab PT Goals PT Goal: Sit to Stand - Progress: Progressing toward goal PT Goal: Stand to Sit - Progress: Progressing toward goal PT Goal: Ambulate - Progress: Progressing toward goal Additional Goals PT Goal: Additional Goal #1 - Progress: Progressing toward goal  PT Treatment Precautions/Restrictions  Precautions Precautions: Posterior Hip Precaution Booklet Issued: Yes (comment) Precaution Comments: Pt verbalized 2/3 precautions.  Reviewed all 3 precautions.   Restrictions Weight Bearing Restrictions: Yes RLE Weight Bearing: Weight bearing as tolerated Mobility (including Balance) Bed Mobility Bed Mobility: No Transfers Sit to Stand: From chair/3-in-1;With upper extremity assist;With armrests;4: Min assist Sit to Stand Details (indicate cue type and reason): Cues for hand placement & reinforcement of precautions.  Performed 2x's.  Stand to Sit: 4: Min assist;To chair/3-in-1;With upper extremity assist;With armrests Stand to Sit Details: (A) to control descent.  Cues for hand placement, use of UE's to control descent, & reinforcement of hip precautions. Performed 2x's.   Ambulation/Gait Ambulation/Gait Assistance: Other (comment) (Min Guard (A)) Ambulation/Gait Assistance Details (indicate cue type and reason): Cues for sequencing, increase Rt heel strike,  increase floor clearance.   Ambulation Distance (Feet): 100 Feet Assistive device: Rolling walker Gait Pattern: Step-to pattern Stairs: No Wheelchair Mobility Wheelchair Mobility: No  Posture/Postural Control Posture/Postural Control: No significant limitations Exercise  Total Joint Exercises Ankle Circles/Pumps: AROM;Both;10 reps;Seated Quad Sets: AROM;Strengthening;Both;10 reps;Seated Gluteal Sets: AROM;Strengthening;Both;10 reps;Seated Marching in Standing: AROM;Strengthening;Right;10 reps;Standing End of Session PT - End of Session Equipment Utilized During Treatment: Gait belt Activity Tolerance: Patient tolerated treatment well;Patient limited by pain Patient left: in chair;with call bell in reach;with family/visitor present General Behavior During Session: [facility] for tasks performed Cognition: [facility] for tasks performed  Lara Mulch 07/06/2011, 11:49 AM [phone]

## 2011-07-06 NOTE — Progress Notes (Signed)
Subjective: 2 Days Post-Op Procedure(s) (LRB): TOTAL HIP ARTHROPLASTY (Right) Patient reports pain as moderate.  He is able to walk reasonably well with PT his biggest problem is getting out of a bed or chair and then getting back down into the bed or chair. Denies CP or SOB  Objective: Vital signs in last 24 hours: Temp:  [98.1 F (36.7 C)-100.1 F (37.8 C)] 98.1 F (36.7 C) (02/28 1307) Pulse Rate:  [82-95] 95  (02/28 1307) Resp:  [18] 18  (02/28 1307) BP: (108-126)/(52-70) 120/59 mmHg (02/28 1307) SpO2:  [93 %-98 %] 95 % (02/28 1307)  Intake/Output from previous day: 02/27 0701 - 02/28 0700 In: 2905 [P.O.:960; I.V.:1945] Out: 1200 [Urine:1200] Intake/Output this shift:     Basename 07/06/11 0625 07/05/11 0620  HGB 13.7 13.8    Basename 07/06/11 0625 07/05/11 0620  WBC 11.6* 12.3*  RBC 4.18* 4.23  HCT 40.0 40.0  PLT 203 244    Basename 07/06/11 0625 07/05/11 0620  NA 132* 135  K 4.1 3.8  CL 98 99  CO2 25 26  BUN 9 7  CREATININE 0.98 0.99  GLUCOSE 112* 118*  CALCIUM 9.0 9.0    Basename 07/06/11 0625 07/05/11 0620  LABPT -- --  INR 2.76* 1.11    Sensation intact distally Intact pulses distally Dorsiflexion/Plantar flexion intact Incision: dressing C/D/I Minimal drainage EHL/FHL firing  Assessment/Plan: 2 Days Post-Op Procedure(s) (LRB): TOTAL HIP ARTHROPLASTY (Right) Advance diet Up with therapy Plan for discharge tomorrow  DC coumadin, will use lovenox when INR <2.0,  History of bowel resection, will be more difficult to regulate coumadin. Anjela Cassara P 07/06/2011, 5:54 PM

## 2011-07-06 NOTE — Progress Notes (Signed)
ANTICOAGULATION CONSULT NOTE - Follow Up Consult  Pharmacy Consult for Coumadin Indication: VTE prophylaxis  No Known Allergies  Patient Measurements: Height: 6\' 1"  (185.4 cm) Weight: 236 lb (107.049 kg) IBW/kg (Calculated) : 79.9   Vital Signs: Temp: 98.1 F (36.7 C) (02/28 1307) BP: 120/59 mmHg (02/28 1307) Pulse Rate: 95  (02/28 1307)  Labs:  Basename 07/06/11 0625 07/05/11 0620  HGB 13.7 13.8  HCT 40.0 40.0  PLT 203 244  APTT -- --  LABPROT 29.6* 14.5  INR 2.76* 1.11  HEPARINUNFRC -- --  CREATININE 0.98 0.99  CKTOTAL -- --  CKMB -- --  TROPONINI -- --   Estimated Creatinine Clearance: 123.4 ml/min (by C-G formula based on Cr of 0.98).   Medications:  Scheduled:    . enoxaparin  40 mg Subcutaneous Q24H  . irbesartan  150 mg Oral Daily  . levothyroxine  250 mcg Oral QAC breakfast  . potassium chloride SA  20 mEq Oral BID  . warfarin  7.5 mg Oral ONCE-1800  . Warfarin - Pharmacist Dosing Inpatient   Does not apply q1800  . zolpidem  10 mg Oral QHS    Assessment: 46 y/o male patient s/p right THA receiving coumadin for dvt px. INR large increase today s/p 1 dose 10mg  and 1 dose 7.5mg , no bleeding reported. INR therapeutic today after 2 doses. Ok to d/c lovenox.  Goal of Therapy:  INR 2-3   Plan:  No coumadin today and f/u daily protime.  Verlene Mayer, PharmD, BCPS Pager [phone] 07/06/2011,2:44 PM

## 2011-07-06 NOTE — Progress Notes (Signed)
Physical Therapy Treatment Patient Details Name: Andrew Love MRN: [id number] DOB: [birth year] Today's Date: 07/06/2011  PT Assessment/Plan  PT - Assessment/Plan Comments on Treatment Session:  Pt with 10/10 pain & declining OOB activity.  Agreeable to LE there-ex in bed.  Pt receiving pain medication upon this clinicians arrival.  PT Plan: Discharge plan remains appropriate PT Frequency: 7X/week Follow Up Recommendations: Home health PT;Supervision/Assistance - 24 hour Equipment Recommended: Rolling walker with 5" wheels PT Goals  Acute Rehab PT Goals PT Goal: Perform Home Exercise Program - Progress: Progressing toward goal   PT Treatment Precautions/Restrictions  Precautions Precautions: Posterior Hip Precaution Booklet Issued: Yes (comment) Precaution Comments: Pt verbalized 2/3 precautions.  Reviewed all 3 precautions.   Restrictions Weight Bearing Restrictions: Yes RLE Weight Bearing: Weight bearing as tolerated Mobility (including Balance) Bed Mobility Bed Mobility: No Transfers Transfers: No  Ambulation/Gait Ambulation/Gait: No Stairs: No Wheelchair Mobility Wheelchair Mobility: No  Posture/Postural Control Posture/Postural Control: No significant limitations Balance Balance Assessed: No Exercise  Total Joint Exercises Ankle Circles/Pumps: AROM;Both;10 reps;Supine Quad Sets: AROM;Both;10 reps;Supine Gluteal Sets: AROM;10 reps;Supine;Both Heel Slides: AAROM;Strengthening;Right;10 reps;Supine Hip ABduction/ADduction: AAROM;Strengthening;Right;10 reps;Supine Straight Leg Raises: AAROM;Strengthening;Right;10 reps;Supine Marching in Standing: AROM;Strengthening;Right;10 reps;Standing End of Session PT - End of Session Equipment Utilized During Treatment: Gait belt Activity Tolerance: Patient limited by pain Patient left: in bed;with call bell in reach;with family/visitor present General Behavior During Session: [facility] for tasks performed Cognition: [facility] for  tasks performed  Lara Mulch 07/06/2011, 3:03 PM [phone]

## 2011-07-06 NOTE — Plan of Care (Signed)
Problem: Phase III Progression Outcomes Goal: Ambulate BID Outcome: Not Progressing Pt refused to walk with PT for 2nd time, due to pain issues

## 2011-07-06 NOTE — Progress Notes (Signed)
Occupational Therapy Treatment Patient Details Name: Andrew Love MRN: [id number] DOB: 1966/02/07 Today's Date: 07/06/2011  OT Assessment/Plan OT Assessment/Plan Comments on Treatment Session: Pt. progressing well  OT Frequency: Min 2X/week Follow Up Recommendations: Home health OT;Supervision - Intermittent Equipment Recommended: Rolling walker with 5" wheels; 3-in-1 OT Goals Acute Rehab OT Goals OT Goal Formulation: With patient Time For Goal Achievement: 7 days ADL Goals Pt Will Perform Lower Body Bathing: with set-up;with supervision;with adaptive equipment;Sit to stand from bed ADL Goal: Lower Body Bathing - Progress: Progressing toward goals Pt Will Perform Lower Body Dressing: with set-up;with supervision;Sit to stand from bed;with adaptive equipment ADL Goal: Lower Body Dressing - Progress: Progressing toward goals Pt Will Transfer to Toilet: with supervision;Ambulation;with DME;3-in-1;Maintaining hip precautions ADL Goal: Toilet Transfer - Progress: Progressing toward goals Additional ADL Goal #1: Pt. will recall 3/3 hip precautions with ADLs. ADL Goal: Additional Goal #1 - Progress: Met  OT Treatment Precautions/Restrictions  Precautions Precautions: Posterior Hip Precaution Booklet Issued: Yes (comment) Precaution Comments: Pt verbalized 2/3 precautions.  Reviewed all 3 precautions.   Restrictions Weight Bearing Restrictions: Yes RLE Weight Bearing: Weight bearing as tolerated   ADL ADL Lower Body Bathing: Simulated;Set up Lower Body Bathing Details (indicate cue type and reason): With use of long handled sponge Where Assessed - Lower Body Bathing: Sit to stand from chair Lower Body Dressing: Simulated;Minimal assistance Lower Body Dressing Details (indicate cue type and reason): With use of reacher and sock aid for don/doff sock and simulated putting on pants with use of pillowcase  Where Assessed - Lower Body Dressing: Sit to stand from chair Equipment Used:  Reacher;Sock aid ADL Comments: Pt. educated on use of AE for completing LB ADLs and wife present and understands where to acquire the kit if desired pending pt. using family assist to complete LB ADLs. Pt. lethargic due to recently having pain meds. Mobility  Bed Mobility Bed Mobility: No Transfers Transfers: Yes Sit to Stand: From chair/3-in-1;With upper extremity assist;With armrests;4: Min assist Sit to Stand Details (indicate cue type and reason): Cues for hand placement & reinforcement of precautions.  Performed 2x's.  Stand to Sit: 4: Min assist;To chair/3-in-1;With upper extremity assist;With armrests Stand to Sit Details: (A) to control descent.  Cues for hand placement, use of UE's to control descent, & reinforcement of hip precautions. Performed 2x's.      End of Session OT - End of Session Equipment Utilized During Treatment: Gait belt Activity Tolerance: Patient tolerated treatment well Patient left: with call bell in reach;in chair Nurse Communication: Mobility status for transfers General Behavior During Session: [facility] for tasks performed Cognition: [facility] for tasks performed  Jamielee Mchale, OTR/L Pager [phone]  07/06/2011, 1:39 PM

## 2011-07-06 NOTE — Progress Notes (Signed)
Utilization review completed. Travus Oren, RN, BSN. 07/06/11  

## 2011-07-07 LAB — CBC
HCT: 40.6 % (ref 39.0–52.0)
Hemoglobin: 13.1 g/dL (ref 13.0–17.0)
MCH: 31 pg (ref 26.0–34.0)
MCHC: 32.3 g/dL (ref 30.0–36.0)
MCV: 96 fL (ref 78.0–100.0)
Platelets: 249 10*3/uL (ref 150–400)
RBC: 4.23 MIL/uL (ref 4.22–5.81)
RDW: 12.7 % (ref 11.5–15.5)
WBC: 11.5 10*3/uL — ABNORMAL HIGH (ref 4.0–10.5)

## 2011-07-07 LAB — PROTIME-INR
INR: 2.31 — ABNORMAL HIGH (ref 0.00–1.49)
Prothrombin Time: 25.8 seconds — ABNORMAL HIGH (ref 11.6–15.2)

## 2011-07-07 NOTE — Progress Notes (Signed)
Occupational Therapy Treatment Patient Details Name: JAXIEL KINES MRN: [id number] DOB: [birth year] Today's Date: 07/07/2011  OT Assessment/Plan OT Assessment/Plan Comments on Treatment Session: Pt. progressing well  OT Plan: Discharge plan remains appropriate OT Frequency: Min 2X/week Follow Up Recommendations: Home health OT;Supervision - Intermittent Equipment Recommended: 3 in 1 bedside comode OT Goals Acute Rehab OT Goals OT Goal Formulation: With patient Time For Goal Achievement: 7 days ADL Goals Pt Will Transfer to Toilet: with supervision;Ambulation;with DME;3-in-1;Maintaining hip precautions ADL Goal: Toilet Transfer - Progress: Met Additional ADL Goal #1: Pt. will recall 3/3 hip precautions with ADLs. ADL Goal: Additional Goal #1 - Progress: Met  OT Treatment Precautions/Restrictions  Precautions Precautions: Posterior Hip Precaution Booklet Issued: Yes (comment) Precaution Comments: Pt verbalized 3/3 precautions.  Reviewed all 3 precautions.   Restrictions Weight Bearing Restrictions: Yes RLE Weight Bearing: Weight bearing as tolerated   ADL ADL Grooming: Performed;Wash/dry hands;Set up;Supervision/safety Where Assessed - Grooming: Standing at sink Toilet Transfer: Performed;Set up;Supervision/safety Toilet Transfer Method: Ambulating Toilet Transfer Equipment: Raised toilet seat with arms (or 3-in-1 over toilet) Toileting - Hygiene: Performed;Modified independent Where Assessed - Toileting Hygiene: Sit to stand from 3-in-1 or toilet Equipment Used: Rolling walker Ambulation Related to ADLs: Pt. supervision ~100' with RW ADL Comments: Pt. educated on car transfer technique and maintaining hip precautions and techniques for completing ADLs with maintaining hip precautions Mobility  Bed Mobility Bed Mobility: Yes Supine to Sit: 6: Modified independent (Device/Increase time) Transfers Transfers: Yes Sit to Stand: 5: Supervision;From chair/3-in-1;With upper  extremity assist     End of Session OT - End of Session Equipment Utilized During Treatment: Gait belt Activity Tolerance: Patient tolerated treatment well Patient left: with call bell in reach;in chair Nurse Communication: Mobility status for transfers General Behavior During Session: [facility] for tasks performed Cognition: [facility] for tasks performed  Yula Crotwell, OTR/L Pager [phone]  07/07/2011, 12:05 PM

## 2011-07-07 NOTE — Discharge Instructions (Signed)
Partial Hip Replacement The hip joint attaches the leg to the pelvis. The joint moves and supports you when you walk. The top of the thighbone (femoral head) is in the shape of a ball. Part of the lower pelvis (acetabulum) forms a cup-shaped socket. The ball and socket are attached with bands called ligaments. Smooth tissue (cartilage) lines the surface of the ball and socket to keep the joint cushioned and to help it move. Lubricating fluid (synovial fluid) also covers joint surfaces to keep the joint healthy. A partial hip replacement most often refers to a surgery to replace only the ball part of the joint. A man-made metal material (prosthesis) replaces the worn femoral head. This surgery is often performed to alleviate the pain of arthritis or a break (fracture). It is most commonly performed for a fractured hip. LET YOUR CAREGIVER KNOW ABOUT:   Allergies to food or medicine.   Medicines taken, including vitamins, herbs, eyedrops, over-the-counter medicines, and creams.   Use of steroids (by mouth or creams).   Previous problems with anesthetics or numbing medicines.   History of bleeding problems or blood clots.   Previous surgery.   Other health problems, including diabetes and kidney problems.   Possibility of pregnancy, if this applies.  RISKS AND COMPLICATIONS  As with any surgery, complications may occur. However, they can usually be managed by your caregiver. General surgical complications may include:  Reaction to anesthesia.   Damage to surrounding nerves, tissues, or structures.   Infection.   Blood clot.   Bleeding.   Scarring.  Complications related to this surgery may include:  Prosthesis not staying attached to the bone.   Joint dislocation.   Continued stiffness or loss of mobility.   Continued pain.  BEFORE THE PROCEDURE  It is important to follow your caregiver's instructions prior to your surgery to avoid complications. Steps before your surgery may  include:  Physical exam, blood and urine tests, X-rays, a computerized magnetic scan (MRI), bone scans, or heart tests (cardiogram or chest X-ray).   Your caregiver will review with you the surgery, the anesthesia being used, and what to expect after the surgery.  You may be asked to:  Stop taking certain medicines for several days prior to your surgery, such as blood thinners (including aspirin). Your caregiver will let you know which medicines you may continue.   Lose weight.   Seek treatment for any dental conditions.   Avoid eating and drinking at least 8 hours before the surgery. This will help you avoid complications from anesthesia.   Take an antibacterial shower the night before or the morning of the surgery.   Quit smoking, if this applies. Smoking increases the chances of a healing problem after your surgery. If you are thinking about quitting, ask your surgeon how long before the surgery you should stop smoking. Ask your primary caregiver about approaches to help you stop. This can include medicines.   Arrange for someone to help you with activities during recovery. Talk with your caregiver about the need for extended care in a facility.  PROCEDURE  The surgery is done after you are given medicine that makes you sleep (general anesthetic) or medicine that makes you numb from the waist down (spinal anesthetic). You will be asleep and will not feel any pain. The surgeon will make a cut (incision) in the hip to access the joint. The top of the thighbone that contains the ball is removed. The surgeon secures a new prosthetic ball joint  into the healthy socket. The nearby bone may grow into the prosthesis to hold it in place, or cement (methacrylate) may be used. The incision is closed with stitches (sutures). The surgery will take several hours to complete. AFTER THE PROCEDURE   You will most likely be in the hospital for 2 to 4 days following surgery.   You will need to lie in bed  and only move as directed.   Caregivers will help you manage pain and swelling with medicines.   You will be asked to perform breathing exercises.   A splint or brace may be applied to the hip.   You will learn specific exercises and how to walk with support. This happens within hours after surgery or the following day. A physical therapist will help you.   You will need to follow your caregiver's instructions for home care after surgery.   Recovery generally takes 3 to 6 months.   You may be asked to limit how often you bend at the waist for 6 weeks.  Document Released: 10/12/2009 Document Revised: 01/04/2011 Document Reviewed: 10/12/2009 [facility] Patient Information 2012 [place], [place].  Home Health to be provided thru [facility]- [phone]

## 2011-07-07 NOTE — Progress Notes (Signed)
PT Progress Note   07/07/11 1300  PT Visit Information  Last PT Received On 07/07/11  Precautions  Precautions Posterior Hip  Precaution Comments Pt verbalized 1/3 post. hip precautions.  Reviewed/Re-educated all 3 precautions  Restrictions  RLE Weight Bearing WBAT  Bed Mobility  Bed Mobility Yes  Supine to Sit 5: Supervision  Supine to Sit Details (indicate cue type and reason) Cues to reinforce hip precations, no Internal Rotation, & safety with scooting hips closer to EOB  Transfers  Transfers Yes  Sit to Stand 5: Supervision;From chair/3-in-1;With upper extremity assist  Sit to Stand Details (indicate cue type and reason) cues for hand placement & technique  Stand to Sit To chair/3-in-1 (Min Guard (A))  Stand to Sit Details Cues for hand placement & technique.   Ambulation/Gait  Ambulation/Gait Yes  Ambulation/Gait Assistance (Min Guard (A))  Ambulation/Gait Assistance Details (indicate cue type and reason) Cues for sequencing, increased heel strike Rt LE, increased hip/knee flexion, increased lateral weight shifting to Rt during Lt swing phase.    Ambulation Distance (Feet) 100 Feet  Assistive device Rolling walker  Gait Pattern Step-through pattern;Decreased stance time - right;Decreased hip/knee flexion - right;Decreased weight shift to right;Right flexed knee in stance  Stairs Yes  Stairs Assistance (Min Guard (A))  Stair Management Technique Step to pattern;Two rails;Forwards  Number of Stairs (2 steps; 2 trials. )  Engineering geologist No  Posture/Postural Control  Posture/Postural Control No significant limitations  Balance  Balance Assessed No  PT - End of Session  Equipment Utilized During Treatment Gait belt  Activity Tolerance Patient limited by pain  Patient left in chair;with call bell in reach  Nurse Communication (RN notified for pain meds)  General  Behavior During Session [facility] for tasks performed  Cognition [facility] for tasks performed    PT - Assessment/Plan  Comments on Treatment Session Continues to be limited by pain.  Did well with stairs this AM.    PT Plan Discharge plan remains appropriate  PT Frequency 7X/week  Follow Up Recommendations Home health PT;Supervision/Assistance - 24 hour  Acute Rehab PT Goals  PT Goal: Supine/Side to Sit - Progress Met  PT Goal: Sit to Stand - Progress Progressing toward goal  PT Goal: Stand to Sit - Progress Progressing toward goal  PT Goal: Ambulate - Progress Progressing toward goal  PT Goal: Up/Down Stairs - Progress Met  Additional Goals  PT Goal: Additional Goal #1 - Progress Progressing toward goal    Verdell Face, PTA [phone] 07/07/2011

## 2011-07-07 NOTE — Discharge Summary (Signed)
Physician Discharge Summary  Patient ID: Andrew Love MRN: [id number] DOB/AGE: 09-29-1965 46 y.o.  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 CAD (coronary artery disease)   . Elevated liver enzymes   . Diverticulitis     Hx of; requiring 3 admissions  . Sigmoid colon ulcer     Rectal polyps  . Thyroid disease   . Psoriasis     Per medical history form dated 06/13/10.  . Chronic insomnia     Per medical history form dated 06/13/10.  . Colitis     Per medical history from dated 06/13/10.  Marland Kitchen Hypertension   . Hypothyroidism   . Bronchitis     history of  . Back pain     chronic  . Arthritis   . 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    He was given perioperative antibiotics:  Anti-infectives     Start     Dose/Rate Route Frequency Ordered Stop   07/04/11 1830   ceFAZolin (ANCEF) IVPB 1 g/50 mL premix        1 g 100 mL/hr over 30 Minutes Intravenous Every 6 hours 07/04/11 1749 07/05/11 0615   07/04/11 0000   ceFAZolin (ANCEF) IVPB 2 g/50 mL premix        2 g 100 mL/hr over 30 Minutes Intravenous 60 min pre-op 07/03/11 1511 07/04/11 1230        .  He was given sequential compression devices, early ambulation, and chemoprophylaxis for DVT prophylaxis. Initially he was placed on Coumadin, but after 1 day he was supratherapeutic, and so this was discontinued. He has had a  previous bowel resection, which likely interferes with his warfarin metabolism, and therefore we transitioned to Lovenox, which we he will begin once he goes home.  He benefited maximally from the hospital stay and there were no complications.    Recent vital signs:  Filed Vitals:   07/07/11 0536  BP: 124/64  Pulse: 78  Temp: 98 F (36.7 C)  Resp: 18    Recent laboratory studies:  Lab Results  Component Value Date   HGB 13.7 07/06/2011   HGB 13.8 07/05/2011   HGB 16.1 06/28/2011   Lab Results  Component Value Date   WBC 11.6* 07/06/2011   PLT 203 07/06/2011   Lab Results  Component Value Date   INR 2.76* 07/06/2011   Lab Results  Component Value Date   NA 132* 07/06/2011   K 4.1 07/06/2011   CL 98 07/06/2011   CO2 25 07/06/2011   BUN 9 07/06/2011   CREATININE 0.98 07/06/2011   GLUCOSE 112* 07/06/2011    Discharge Medications:   Medication List  As of 07/07/2011  7:36 AM   STOP taking these medications         oxyCODONE-acetaminophen 5-325 MG per tablet         TAKE these medications         AMBIEN 10 MG tablet  Generic drug: zolpidem   Take 10 mg by mouth at bedtime.      CALCIUM 1200 PO   Take 1 tablet by mouth daily.      enoxaparin 40 MG/0.4ML Soln   Commonly known as: LOVENOX   Inject 0.4 mLs (40 mg total) into the skin daily.      fish oil-omega-3 fatty acids 1000 MG capsule   Take 2 g by mouth daily.      levothyroxine 200 MCG tablet   Commonly known as: SYNTHROID, LEVOTHROID   Take 200 mcg by mouth daily. Take with 50 mcg to equal 250 mcg daily.      levothyroxine 50 MCG tablet   Commonly known as: SYNTHROID, LEVOTHROID   Take 50 mcg by mouth daily. Take with 200 mcg tablet to equal 250 mcg daily.      methocarbamol 500 MG tablet   Commonly known as: ROBAXIN   Take 1 tablet (500 mg total) by mouth 4 (four) times daily.      olmesartan 20 MG tablet   Commonly known as: BENICAR   Take 20 mg by mouth daily.      oxyCODONE-acetaminophen 10-325 MG  per tablet   Commonly known as: PERCOCET   Take 1-2 tablets by mouth every 6 (six) hours as needed for pain. MAXIMUM TOTAL ACETAMINOPHEN DOSE IS 4000 MG PER DAY      potassium chloride SA 20 MEQ tablet   Commonly known as: K-DUR,KLOR-CON   Take 20 mEq by mouth 2 (two) times daily.      promethazine 25 MG tablet   Commonly known as: PHENERGAN   Take 25 mg by mouth every 8 (eight) hours as needed. As needed for nausea.            Diagnostic Studies: Dg Chest 2 View  06/28/2011  *RADIOLOGY REPORT*  Clinical Data: Preop  CHEST - 2 VIEW  Comparison: 04/23/2010  Findings: Cardiomediastinal silhouette is stable.  No acute infiltrate or pleural effusion.  No pulmonary edema.  Bony thorax is stable.  IMPRESSION: No active disease.  No significant change.  Original Report Authenticated By: Natasha Mead, M.D.   Dg Pelvis Portable  07/04/2011  *RADIOLOGY REPORT*  Clinical Data: Postop hip replacement  PORTABLE PELVIS  Comparison: 05/25/2011  Findings: Right total hip arthroplasty has been placed.  Anatomic alignment of the osseous and prosthetic structures.  No breakage or loosening of the hardware.  IMPRESSION: Right total arthroplasty anatomically aligned.  Original Report Authenticated By: Donavan Burnet, M.D.   Dg Hip Portable 1 View Right  07/04/2011  *RADIOLOGY REPORT*  Clinical Data: Total arthroplasty  PORTABLE RIGHT HIP - 1 VIEW  Comparison: None.  Findings: Right total hip arthroplasty is in place.  Anatomic alignment of the osseous and prosthetic structures.  No breakage or loosening of the hardware.  IMPRESSION: Total hip arthroplasty anatomically aligned.  Original Report Authenticated By: Donavan Burnet, M.D.    Disposition: 01-Home or Self Care  Discharge Orders    Future Orders Please Complete By Expires   Diet general      Call MD / Call 911      Comments:   If you experience chest pain or shortness of breath, CALL 911 and be transported to the hospital emergency room.  If you  develope a fever above 101 F, pus (white drainage) or increased drainage or redness at the wound, or calf pain, call your surgeon's office.   Constipation Prevention  Comments:   Drink plenty of fluids.  Prune juice may be helpful.  You may use a stool softener, such as Colace (over the counter) 100 mg twice a day.  Use MiraLax (over the counter) for constipation as needed.   Increase activity slowly as tolerated      Weight Bearing as taught in Physical Therapy      Comments:   Use a walker or crutches as instructed.   Discharge wound care:      Comments:   If you have a hip bandage, keep it clean and dry.  Change your bandage as instructed by your health care providers.  If your bandage has been discontinued, keep your incision clean and dry.  Pat dry after bathing.  DO NOT put lotion or powder on your incision.   Follow the hip precautions as taught in Physical Therapy      Change dressing      Comments:   You may change your dressing in 3 days, then change the dressing daily with sterile 4 x 4 inch gauze dressing and paper tape.  You may clean the incision with alcohol prior to redressing   TED hose      Comments:   Use stockings (TED hose) for 2 weeks on both leg(s).  You may remove them at night for sleeping.      Follow-up Information    Follow up with Griffey Nicasio P, MD in 2 weeks.   Contact information:   [facility] [address] [place] [address] [phone]           Signed: Eulas Post 07/07/2011, 7:36 AM

## 2011-07-07 NOTE — Progress Notes (Signed)
CARE MANAGEMENT NOTE 07/07/2011   Action/Plan:   Discharge planning. Spoke to patient and wife. Choice offered. preoperatively setup with [facility], no changes, has rolling walker, and a elevated toilet seat.   Anticipated DC Date:  07/07/2011   Anticipated DC Plan:  HOME W HOME HEALTH SERVICES      DC Planning Services  CM consult      [facility] Choice  HOME HEALTH   Choice offered to / List presented to:  C-1 Patient   DME arranged  NA      DME agency  NA     HH arranged  HH-3 OT  HH-1 RN  HH-2 PT      HH agency  [facility]   Status of service:  Completed, signed off  Discharge Disposition:  HOME W HOME HEALTH SERVICES

## 2011-07-07 NOTE — Progress Notes (Signed)
Subjective: 3 Days Post-Op Procedure(s) (LRB): TOTAL HIP ARTHROPLASTY (Right) Patient reports pain as moderate.   He reports that he is ready to go home and I discussed the options with him.  He would really like to be able to go today.  He denies any CP or SOB. Objective: Vital signs in last 24 hours: Temp:  [98 F (36.7 C)-98.1 F (36.7 C)] 98 F (36.7 C) (03/01 0536) Pulse Rate:  [78-95] 78  (03/01 0536) Resp:  [18] 18  (03/01 0536) BP: (120-124)/(59-64) 124/64 mmHg (03/01 0536) SpO2:  [95 %-98 %] 98 % (03/01 0536)  Intake/Output from previous day: 02/28 0701 - 03/01 0700 In: 600 [P.O.:600] Out: 1200 [Urine:1200] Intake/Output this shift:     Basename 07/06/11 0625 07/05/11 0620  HGB 13.7 13.8    Basename 07/06/11 0625 07/05/11 0620  WBC 11.6* 12.3*  RBC 4.18* 4.23  HCT 40.0 40.0  PLT 203 244    Basename 07/06/11 0625 07/05/11 0620  NA 132* 135  K 4.1 3.8  CL 98 99  CO2 25 26  BUN 9 7  CREATININE 0.98 0.99  GLUCOSE 112* 118*  CALCIUM 9.0 9.0    Basename 07/06/11 0625 07/05/11 0620  LABPT -- --  INR 2.76* 1.11    Sensation intact distally Dorsiflexion/Plantar flexion intact Incision: scant drainage Wound clean and Dry soft dressing applied.   Assessment/Plan: 3 Days Post-Op Procedure(s) (LRB): TOTAL HIP ARTHROPLASTY (Right) Advance diet Up with therapy Discharge home with home health Will need to Pass PT to be able to go home Haskel Khan 07/07/2011, 7:03 AM

## 2011-07-07 NOTE — Progress Notes (Signed)
D/C instructions reviewed with pt and wife. Education provided. Both verbalized understanding of all instructions. Pt's wife knowledgeable of giving injections, discussed with her and pt on how to administer lovenox injections, unknown that pt was going home on lovenox until d/c instructions printed out. 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 Copy of d/cinstructions and scripts given to pt/wife. Pt d/c'd via wheelchair with belongings, with wife and escorted by unit NT.

## 2011-07-11 ENCOUNTER — Encounter (HOSPITAL_COMMUNITY): Payer: Self-pay | Admitting: Orthopedic Surgery

## 2011-08-04 ENCOUNTER — Ambulatory Visit (HOSPITAL_COMMUNITY)
Admission: RE | Admit: 2011-08-04 | Discharge: 2011-08-04 | Disposition: A | Discharge status: Home / Self Care | Payer: 59 | Source: Ambulatory Visit | Attending: Internal Medicine | Admitting: Internal Medicine

## 2011-08-04 DIAGNOSIS — M25659 Stiffness of unspecified hip, not elsewhere classified: Secondary | ICD-10-CM | POA: Insufficient documentation

## 2011-08-04 DIAGNOSIS — M6281 Muscle weakness (generalized): Secondary | ICD-10-CM | POA: Insufficient documentation

## 2011-08-04 DIAGNOSIS — M25559 Pain in unspecified hip: Secondary | ICD-10-CM | POA: Insufficient documentation

## 2011-08-04 DIAGNOSIS — IMO0001 Reserved for inherently not codable concepts without codable children: Secondary | ICD-10-CM | POA: Insufficient documentation

## 2011-08-04 DIAGNOSIS — R262 Difficulty in walking, not elsewhere classified: Secondary | ICD-10-CM | POA: Insufficient documentation

## 2011-08-04 NOTE — Evaluation (Signed)
Physical Therapy Evaluation  Patient Details  Name: Andrew Love MRN: [id number] Date of Birth: 06-19-1965  Today's Date: 08/04/2011 Time: 4098-1191 Time Calculation (min): 43 min Charges: 1 eval Visit#: 1  of 8   Re-eval: 09/03/11 Assessment Diagnosis: R THA Surgical Date: 07/04/11 Next MD Visit: 08/23/11 Prior Therapy: HHPT  Past Medical History:  Past Medical History  Diagnosis Date  . CAD (coronary artery disease)   . Elevated liver enzymes   . Diverticulitis     Hx of; requiring 3 admissions  . Sigmoid colon ulcer     Rectal polyps  . Thyroid disease   . Psoriasis     Per medical history form dated 06/13/10.  . Chronic insomnia     Per medical history form dated 06/13/10.  . Colitis     Per medical history from dated 06/13/10.  Marland Kitchen Hypertension   . Hypothyroidism   . Bronchitis     history of  . Back pain     chronic  . Arthritis   . 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 Colonoscopy 07/2008    Colitis,NSAID v. Ischemia,malrotation of the gut,Diverticulosis(L),hyperplastic  . Cardiac catheterization 2009  . Appendectomy   . Shoulder surgery     Left  . Rotator cuff repair     Left - per medical history form dated 06/13/10.  . Cardiac catheterization     Per medical history from dated 06/13/10.  . Thyroidectomy     Per medical history form dated 06/13/10.  . Colon surgery 2012  . Total hip arthroplasty 07/04/2011    Procedure: TOTAL HIP ARTHROPLASTY;  Surgeon: Eulas Post, MD;  Location: [facility] OR;  Service: Orthopedics;  Laterality: Right;    Subjective Symptoms/Limitations Symptoms: Pt had R THR on 07/04/11.  After falling 2x on his hip (from dysplaigia).  First time Oct. 2011 and the second time was Oct 2012.  Both incidence happened when bend over.  His c/co is unable to complete standing/walking activities for an hour.   Pain range 3-10/10.  Alleviating: oxycodone and ice.   Aggrevates: standing, walking How long can you sit comfortably?: Recliner: Hours, Car: 30 minutes or less How long can you stand comfortably?: 45 minutes.  He tried to stand/walk for an hour and became diaphretic and pail.  How long can you walk comfortably?: less than an hour. Pain Assessment Currently in Pain?: Yes Pain Score:   6 (standing) Pain Location: Hip Pain Orientation: Right Pain Type: Acute pain  Precautions/Restrictions  Precautions Precautions: Posterior Hip Restrictions Weight Bearing Restrictions: Yes  Prior Functioning  Home Living Lives With: Spouse;Son Prior Function Vocation: Full time employment Vocation Requirements: Airline pilot.  48 hours a week: 4 days/12 hour shifts:  Has to do a lot of up and down and walking Leisure: Hobbies-yes (Comment) Comments: He enjoys golf and working. Enjoys walking for exercise ( a lot with golfing) Enjoys any sporting activities.  He has a 73 year old son.   Cognition/Observation Observation/Other Assessments Observations: comes in today with a cane.    Sensation/Coordination/Flexibility/Functional Tests Functional Tests Functional Tests: LEFS: 14/80 Functional Tests: 30 sec STS: 4x; compensations: places greater weight through LLE and difficutly with fully extending R hip  Assessment RLE Strength Right Hip Flexion: 3/5 Right Hip Extension: 3/5 (performed with funcitonal sit to stand test) Right Hip ABduction: 2+/5 Right Hip ADduction: 3/5 Right Knee Flexion: 5/5 Right Knee Extension: 5/5  Palpation Palpation: Pain and tenderness around inscision and to posterior hip.  Muscular spasms to gluteal region  Mobility/Balance  Ambulation/Gait Ambulation/Gait: Yes Assistive device: Straight cane Gait Pattern: Antalgic;Decreased hip/knee flexion - right Static Standing Balance Single Leg Stance - Right Leg: 10  (increased lateral lean) Single Leg Stance - Left Leg: 10  Tandem Stance - Right Leg: 10   (impaired ankle straegy) Tandem Stance - Left Leg: 10  Rhomberg - Eyes Opened: 10  Rhomberg - Eyes Closed: 10    Exercise/Treatments Standing Heel Raises: 10 reps Other Standing Knee Exercises: Toe Raises 10x Supine Hip Adduction Isometric: 5 reps (10 sec hold) Straight Leg Raises: 5 reps (partial range) Other Supine Knee Exercises: Hip adduction isometric 5x10 sec Prone  Hamstring Curl: 5 seconds   Physical Therapy Assessment and Plan PT Assessment and Plan Clinical Impression Statement: Pt is a 46 year old male referred to PT s/p R THA on 07/04/11.  He has a history of hip dysplagia which contributed to an early THA.  After examination today it was found that he has current impairments including increased R hip pain, decreased hip strength and muscular endurance, impaired balance, decreased functional strength, difficulty walking and impaired percieved functional ability which is limiting him in participating in his household, work and lesuire activities.  Pt will benefit from skilled OP PT in order to address above impairments in order to reach functional goals.  PT Duration: 8 weeks PT Treatment/Interventions: Gait training;Stair training;Functional mobility training;Therapeutic activities;Therapeutic exercise;Balance training;Neuromuscular re-education;Patient/family education;Other (comment) (Manual and modalities PRN) PT Plan: TM for endurance, squats, heel/toe raises, cybex machines when able.      Goals Home Exercise Program Pt will Perform Home Exercise Program: Independently PT Goal: Perform Home Exercise Program - Progress: Goal set today PT Short Term Goals Time to Complete Short Term Goals: 2 weeks PT Short Term Goal 1: Pt will report pain less than 4/10 for 50% of his day.  PT Short Term Goal 2: Pt will improve his LE strength by 1/2 muscle grade.  PT Short Term Goal 3: Pt will ambulate independently in a closed environment. PT Short Term Goal 4: Pt will demonstrate L  SLS x10 sec with appropriate posture on a static surface.  PT Long Term Goals Time to Complete Long Term Goals: 8 weeks PT Long Term Goal 1: Pt will report pain less than 4/10 for 75% of his day for improved QOL PT Long Term Goal 2: Pt will improve his LEFS to 40/80 for improved percieved functional ability. Long Term Goal 3: Pt will improve LE functional strength to [facility] and ascend and descend 10 stairs with 1 handrail with reciprocal pattern.  Long Term Goal 4: Pt will improve his muscular endurance and tolerate standing/walking independently for greater than an hour in order to return to work related activities.  PT Long Term Goal 5: Pt will improve his dynamic balance and demonstrate independent gait in outdoor surfaces x15 minutes in order for safe return to golf activities.   Problem List Patient Active Problem List  Diagnoses  . RECTAL BLEEDING  . HEPATITIS  . DIARRHEA  . ABDOMINAL PAIN, LEFT LOWER QUADRANT  . LIVER FUNCTION TESTS, ABNORMAL, HX OF  . DIVERTICULITIS, HX OF  . Fever  . Night sweats  . Chills  . Insomnia  . High blood pressure  . Poor circulation  . Cough with sputum  . High cholesterol  . Thyroid disease  . Bruises easily/Rash  . Nausea vomiting and diarrhea  .  Poor appetite  . Dizziness  . Weakness  . Fainting  . Generalized headaches  . Ear infection  . Sinus problem/runny nose  . Hoarseness/sore throat  . Wears glasses  . Arthritis  . 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 Hip pain  . Muscle weakness (generalized)  . Difficulty in walking    Kingsten Enfield 08/04/2011, 1:15 PM  Physician Documentation Your signature is required to indicate approval of the treatment plan as stated above.  Please sign and either send electronically or make a copy of this report for your files and return this physician signed original.   Please mark one 1.__approve of plan  2. ___approve of plan with the following  conditions.   ______________________________                                                          _____________________ Physician Signature                                                                                                             Date

## 2011-08-15 ENCOUNTER — Ambulatory Visit (HOSPITAL_COMMUNITY)
Admission: RE | Admit: 2011-08-15 | Discharge: 2011-08-15 | Disposition: A | Discharge status: Home / Self Care | Payer: 59 | Source: Ambulatory Visit | Attending: Internal Medicine | Admitting: Internal Medicine

## 2011-08-15 DIAGNOSIS — IMO0001 Reserved for inherently not codable concepts without codable children: Secondary | ICD-10-CM | POA: Insufficient documentation

## 2011-08-15 DIAGNOSIS — M25559 Pain in unspecified hip: Secondary | ICD-10-CM | POA: Insufficient documentation

## 2011-08-15 DIAGNOSIS — M6281 Muscle weakness (generalized): Secondary | ICD-10-CM | POA: Insufficient documentation

## 2011-08-15 DIAGNOSIS — M25659 Stiffness of unspecified hip, not elsewhere classified: Secondary | ICD-10-CM | POA: Insufficient documentation

## 2011-08-15 DIAGNOSIS — R262 Difficulty in walking, not elsewhere classified: Secondary | ICD-10-CM | POA: Insufficient documentation

## 2011-08-15 NOTE — Progress Notes (Signed)
Physical Therapy Treatment Patient Details  Name: Andrew Love MRN: [id number] Date of Birth: [birth year]  Today's Date: 08/15/2011 Time: 8295-6213 Time Calculation (min): 44 min Visit#: 2  of 8   Re-eval: 09/03/11  Charge: gait 10 min therex 34 min  Subjective: Symptoms/Limitations Symptoms: My hip is sore today, trying to cut back on pain meds so havent taken any prior to this session.  Pain scale 3/10. Pain Assessment Currently in Pain?: Yes Pain Score:   3 Pain Location: Hip Pain Orientation: Right  Objective:   Exercise/Treatments Aerobic Tread Mill: 8' @ 1.8 following gait training Machines for Strengthening Cybex Knee Extension: 2 PL 15 x Cybex Knee Flexion: 3 PL x 15 Cybex Leg Press: 4.5 Pl 2x 15 Standing Heel Raises: 10 reps;Limitations Heel Raises Limitations: toe raises 10 reps no HHA Hip ADduction: Limitations;15 reps Hip ADduction Limitations: hip abduction and hip extension 15 reps Lateral Step Up: 15 reps;Hand Hold: 1;Step Height: 4" Forward Step Up: 15 reps;Hand Hold: 0;Step Height: 4" Functional Squat: 15 reps Gait Training: 8 min Gait training for equal stance phases no AD 2 RT around dept     Physical Therapy Assessment and Plan PT Assessment and Plan Clinical Impression Statement: Added new exercises per PT plan, min cueing to equalize stride length with gait training, pt with proper gait mechanics noted following session.  Able to instruct step training with min cueing to reduce opposite leg momentum when stepping up, pt completed without difficutly following cues. PT Plan: Begin B SLS, vector stance, step down when appropriate.    Goals    Problem List Patient Active Problem List  Diagnoses  . RECTAL BLEEDING  . HEPATITIS  . DIARRHEA  . ABDOMINAL PAIN, LEFT LOWER QUADRANT  . LIVER FUNCTION TESTS, ABNORMAL, HX OF  . DIVERTICULITIS, HX OF  . Fever  . Night sweats  . Chills  . Insomnia  . High blood pressure  . Poor circulation  .  Cough with sputum  . High cholesterol  . Thyroid disease  . Bruises easily/Rash  . Nausea vomiting and diarrhea  . Poor appetite  . Dizziness  . Weakness  . Fainting  . Generalized headaches  . Ear infection  . Sinus problem/runny nose  . Hoarseness/sore throat  . Wears glasses  . Arthritis  . 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 Hip pain  . Muscle weakness (generalized)  . Difficulty in walking    PT - End of Session Activity Tolerance: Patient tolerated treatment well General Behavior During Session: [facility] for tasks performed Cognition: [facility] for tasks performed  GP No functional reporting required  Juel Burrow, PTA 08/15/2011, 4:46 PM

## 2011-08-17 ENCOUNTER — Ambulatory Visit (HOSPITAL_COMMUNITY)
Admission: RE | Admit: 2011-08-17 | Discharge: 2011-08-17 | Disposition: A | Discharge status: Home / Self Care | Payer: 59 | Source: Ambulatory Visit | Attending: Internal Medicine | Admitting: Internal Medicine

## 2011-08-17 NOTE — Progress Notes (Signed)
Physical Therapy Treatment Patient Details  Name: Andrew Love MRN: [id number] Date of Birth: Oct 11, 1965  Today's Date: 08/17/2011 Time: 2130-8657 Time Calculation (min): 45 min Visit#: 3  of 8   Re-eval: 09/01/11 Diagnosis: R THA Surgical Date: 07/04/11 Next MD Visit: 08/23/11 Prior Therapy: HHPT Charges:  therex 28', patient education 12'  Subjective: Symptoms/Limitations Symptoms: Pt. states his hip is still sore, but no real pain.  Pt. asking best way to don shoes/socks.  Pt. shown a hip kit and how to use the tools. Pain Assessment Currently in Pain?: No/denies  Exercises Instructed by Trilby Leaver, SPTA under the direction of Mavrik Bynum Bascom Levels, PTA/CI. Exercise/Treatments Aerobic Tread Mill: 8' @ 1.8 following gait training, 1 HHA to none Machines for Strengthening Cybex Knee Extension: time Cybex Knee Flexion: time Cybex Leg Press: time Standing Heel Raises: 10 reps Heel Raises Limitations: off bottom step; toeraises 15 reps Hip ADduction: Limitations;15 reps Hip ADduction Limitations: hip abduction and hip extension 15 reps Lateral Step Up: 15 reps;Hand Hold: 1;Step Height: 4" Forward Step Up: 15 reps;Hand Hold: 0;Step Height: 4" Step Down: 15 reps;Step Height: 4";Hand Hold: 1 Functional Squat: 15 reps SLS: 3 trials, max R: 35", L: 40" SLS with Vectors: 5X5" each LE Gait Training: with SPC focusing on equal stride X 150 feet    Physical Therapy Assessment and Plan PT Assessment and Plan Clinical Impression Statement: Pt. educated on hip kit, how to use and where to purchase.  Pt. concerned of leg length descrepancy causing his antalgia, so will measure next visit.  Added SLS and vector stance to increase stability of hips. PT Plan: Add sidelying clam and hip abduction with pillow between knees for precautions; measure leg length.     PT - End of Session Activity Tolerance: Patient tolerated treatment well General Behavior During Session: [facility] for tasks  performed Cognition: [facility] for tasks performed    Trilby Leaver, SPTA/ Fortunata Betty B. Bascom Levels, PTA 08/17/2011, 2:58 PM

## 2011-08-22 ENCOUNTER — Ambulatory Visit (HOSPITAL_COMMUNITY)
Admission: RE | Admit: 2011-08-22 | Discharge: 2011-08-22 | Disposition: A | Discharge status: Home / Self Care | Payer: 59 | Source: Ambulatory Visit

## 2011-08-22 NOTE — Progress Notes (Signed)
Physical Therapy Treatment Patient Details  Name: Andrew Love MRN: [id number] Date of Birth: [birth year]  Today's Date: 08/22/2011 Time: 4098-1191 Time Calculation (min): 46 min Visit#: 4  of 8   Re-eval: 09/01/11  Charge: gait 8 min therex 30 min Self care 8 min  Subjective: Symptoms/Limitations Symptoms: Pt reported walking .5 mile this morning with no AD, pain scale 2/10 with pain meds. Pain Assessment Currently in Pain?: Yes Pain Score:   2 Pain Location: Hip Pain Orientation: Right  Objective:   Exercise/Treatments Stretches Gastroc Stretch: 2 reps;30 seconds Aerobic Tread Mill: 8' @ 1.8 following gait training, 1 HHA to none Machines for Strengthening Cybex Knee Extension: 2 Pl 2x 15 B extend, R eccentric lowering Cybex Knee Flexion: 4.5 PL 2x 15 Cybex Leg Press: 5 Pl 2x 15 Standing Heel Raises: 15 reps;Limitations Heel Raises Limitations: R only;  SLS: 3 trials, max R: 58" L: >1'+ Sidelying Hip ABduction: Both;10 reps;Limitations Hip ABduction Limitations: pillow between knees Clams: NMR glut med 5x 10"  Physical Therapy Assessment and Plan PT Assessment and Plan Clinical Impression Statement: NMR complete for glut med, max vc-ing to reduce compensation with quad and hamstrings.  Leg length measurement complete with .7cm difference in length, recommended lift in left foot to reduce trendelemburg gait.   PT Plan: Assess gait mechanics with lift in L shoe, continue with current POC with balance, strengthening and stability.    Goals    Problem List Patient Active Problem List  Diagnoses  . RECTAL BLEEDING  . HEPATITIS  . DIARRHEA  . ABDOMINAL PAIN, LEFT LOWER QUADRANT  . LIVER FUNCTION TESTS, ABNORMAL, HX OF  . DIVERTICULITIS, HX OF  . Fever  . Night sweats  . Chills  . Insomnia  . High blood pressure  . Poor circulation  . Cough with sputum  . High cholesterol  . Thyroid disease  . Bruises easily/Rash  . Nausea vomiting and diarrhea  .  Poor appetite  . Dizziness  . Weakness  . Fainting  . Generalized headaches  . Ear infection  . Sinus problem/runny nose  . Hoarseness/sore throat  . Wears glasses  . Arthritis  . 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 Hip pain  . Muscle weakness (generalized)  . Difficulty in walking    PT - End of Session Activity Tolerance: Patient tolerated treatment well General Behavior During Session: [facility] for tasks performed Cognition: [facility] for tasks performed  GP No functional reporting required  Juel Burrow, PTA 08/22/2011, 12:57 PM

## 2011-08-24 ENCOUNTER — Ambulatory Visit (HOSPITAL_COMMUNITY)
Admission: RE | Admit: 2011-08-24 | Discharge: 2011-08-24 | Disposition: A | Discharge status: Home / Self Care | Payer: 59 | Source: Ambulatory Visit | Attending: Internal Medicine | Admitting: Internal Medicine

## 2011-08-24 NOTE — Progress Notes (Signed)
Physical Therapy Treatment Patient Details  Name: Andrew Love MRN: [id number] Date of Birth: [birth year]  Today's Date: 08/24/2011 Time: 0930-1020 Time Calculation (min): 50 min Visit#: 5  of 8   Re-eval: 09/01/11 Diagnosis: R THA Surgical Date: 07/04/11 Next MD Visit: 10/05/11 Charges:  therex  37', gait 8'  Subjective: Symptoms/Limitations Symptoms: Pt. states he walked 1.5 miles yesterday and worked in the yard (mowing, weedeating, Biochemist, clinical); states he is sore today 4/10 pain in R hip. Pain Assessment Currently in Pain?: Yes Pain Score:   4 Pain Location: Hip Pain Orientation: Right  Precautions/Restrictions  Precautions Precautions: Posterior Hip   Exercises Instructed by Trilby Leaver, SPTA under the direction of Vika Buske Bascom Levels, PTA/CI. Exercise/Treatments Stretches Gastroc Stretch: 2 reps;30 seconds Aerobic Tread Mill: 8' @ 2.0 following gait training, 1 HHA to none Machines for Strengthening Cybex Knee Extension: 3 Pl 2x 15 B extend, R eccentric lowering Cybex Knee Flexion: 4.5 PL 2x 15 Cybex Leg Press: 5 Pl 2x 15 Standing Heel Raises: 15 reps Heel Raises Limitations: R only;  SLS: 3 trials, max R: 25" SLS with Vectors: 5X5" each LE Sidelying Hip ABduction: 10 reps Hip ABduction Limitations: pillow between knees Clams: NMR glut med 5x 10"   Physical Therapy Assessment and Plan PT Assessment and Plan Clinical Impression Statement: Pt. has purchased hip kit and using to protect hip precautions but has not gotten a lift.  Feels he is progressing well; MD was pleased with progress. PT Plan: Add tband for golf swing and perform SLS first next visit.     PT - End of Session Activity Tolerance: Patient tolerated treatment well General Behavior During Session: [facility] for tasks performed Cognition: [facility] for tasks performed    Trilby Leaver, SPTA/ Delsy Etzkorn B. Bascom Levels, PTA 08/24/2011, 10:35 AM

## 2011-08-29 ENCOUNTER — Ambulatory Visit (HOSPITAL_COMMUNITY)
Admission: RE | Admit: 2011-08-29 | Discharge: 2011-08-29 | Disposition: A | Discharge status: Home / Self Care | Payer: 59 | Source: Ambulatory Visit | Attending: Physical Therapy | Admitting: Physical Therapy

## 2011-08-29 NOTE — Progress Notes (Signed)
Physical Therapy Treatment Patient Details  Name: Andrew Love MRN: [id number] Date of Birth: March 13, 1966  Today's Date: 08/29/2011 Time: 4098-1191 Time Calculation (min): 44 min Visit#: 6  of 8   Re-eval: 09/01/11  Charge: gait 8 min therex 36 min  Subjective: Symptoms/Limitations Symptoms: Pt reported yesterday really painful all over body; went outside on uneven surfaces; feeling better today maybe 1/10.  Pt reported compliance with HEP, been practicing SLS at home. Pain Assessment Currently in Pain?: Yes Pain Score:   1 Pain Location: Hip Pain Orientation: Right  Objective:   Exercise/Treatments Machines for Strengthening Cybex Knee Extension: 3 Pl 2x 15 B extend R eccentric lowering Cybex Knee Flexion: 4.5 PL 2x 15 Cybex Leg Press: 5 Pl 2x 15 Standing Heel Raises: 20 reps Lateral Step Up: 15 reps;Hand Hold: 1;Step Height: 6" Forward Step Up: Right;Hand Hold: 0;Step Height: 6";20 reps Step Down: 15 reps;Step Height: 4";Hand Hold: 1 Functional Squat: 20 reps;Limitations Functional Squat Limitations: pick up ball from 6 SLS: 61" 1st attempt SLS with Vectors: 5X5" each LE Gait Training: No AD gait training to equalize stride length x 8 min Sidelying Hip ABduction: 10 reps Hip ABduction Limitations: pillow between knees Clams: NMR glut med 10x 10"; vc-ing to reduce H/S substitution rep 4     Physical Therapy Assessment and Plan PT Assessment and Plan Clinical Impression Statement: Pt progressing well towards towards therex with balance and strength improving.  Pt able to SLS >1' with no HHA at beginning of session.  Able to increase height with step up and lateral step with good control noted.  No assistance required with hip abduction this session.  Pt with vc-ing to reduce antalgic gait, slowed cadence with vc-ing for heel to toe gait with good gait mechanics noted.    PT Plan: Progress POC, re-eval in 2 more sessions.      Goals    Problem List Patient  Active Problem List  Diagnoses  . RECTAL BLEEDING  . HEPATITIS  . DIARRHEA  . ABDOMINAL PAIN, LEFT LOWER QUADRANT  . LIVER FUNCTION TESTS, ABNORMAL, HX OF  . DIVERTICULITIS, HX OF  . Fever  . Night sweats  . Chills  . Insomnia  . High blood pressure  . Poor circulation  . Cough with sputum  . High cholesterol  . Thyroid disease  . Bruises easily/Rash  . Nausea vomiting and diarrhea  . Poor appetite  . Dizziness  . Weakness  . Fainting  . Generalized headaches  . Ear infection  . Sinus problem/runny nose  . Hoarseness/sore throat  . Wears glasses  . Arthritis  . 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 Hip pain  . Muscle weakness (generalized)  . Difficulty in walking    PT - End of Session Activity Tolerance: Patient tolerated treatment well General Behavior During Session: [facility] for tasks performed Cognition: [facility] for tasks performed  GP No functional reporting required  Juel Burrow 08/29/2011, 11:31 AM

## 2011-08-31 ENCOUNTER — Ambulatory Visit (HOSPITAL_COMMUNITY)
Admission: RE | Admit: 2011-08-31 | Discharge: 2011-08-31 | Disposition: A | Discharge status: Home / Self Care | Payer: 59 | Source: Ambulatory Visit | Attending: Internal Medicine | Admitting: Internal Medicine

## 2011-08-31 NOTE — Progress Notes (Signed)
Physical Therapy Treatment Patient Details  Name: Andrew Love MRN: [id number] Date of Birth: 11-04-1965  Today's Date: 08/31/2011 Time: 1015-1110 Time Calculation (min): 55 min Visit#: 7  of 8   Re-eval: 09/01/11  Charge: gait 12 min therex 33 min Ice 10  Subjective: Symptoms/Limitations Symptoms: Pt stated increased pain following last session, pt believes it came from the squats.  Pain scale 2/10 with pain meds.   Pain Assessment Currently in Pain?: Yes Pain Score:   2 Pain Location: Hip Pain Orientation: Right  Objective:   Exercise/Treatments Aerobic Tread Mill: Designer, television/film set for Strengthening Cybex Knee Extension: 4Pl 20x B extend R eccentric lowering Cybex Knee Flexion: 5PL 20 x Cybex Leg Press: 5.5 Pl 20 x Standing Lateral Step Up: 15 reps;Hand Hold: 0;Step Height: 6";Limitations Lateral Step Up Limitations: hip hike 15 reps 4 in step Forward Step Up: 15 reps;Hand Hold: 0;Step Height: 6" Step Down: 15 reps;Hand Hold: 0;Step Height: 4" Functional Squat: Limitations Functional Squat Limitations: held due to soreness last sessin SLS: 1'+ on foam 3rd attempt SLS with Vectors: 10x 5" Gait Training: No AD gait training to equalize stride length x 8 min Supine Bridges: 10 reps;Limitations Bridges Limitations: 10 sec hold with ball between knees   Modalities Modalities: Cryotherapy Cryotherapy Number Minutes Cryotherapy: 10 Minutes Cryotherapy Location: Hip Type of Cryotherapy: Ice pack  Physical Therapy Assessment and Plan PT Assessment and Plan Clinical Impression Statement: Pt making good progress towards balance and strengthening.  Pt able to SLS on dynamic surface for 1 minute with no assistance required.  Increased weight on cybex machines today with good control but visible fatigue noted.  Added bridges to address glut weakness, pt able to complete without difficulty.   PT Plan: Pt taking week vacation. re-assess when return next session.     Goals    Problem List Patient Active Problem List  Diagnoses  . RECTAL BLEEDING  . HEPATITIS  . DIARRHEA  . ABDOMINAL PAIN, LEFT LOWER QUADRANT  . LIVER FUNCTION TESTS, ABNORMAL, HX OF  . DIVERTICULITIS, HX OF  . Fever  . Night sweats  . Chills  . Insomnia  . High blood pressure  . Poor circulation  . Cough with sputum  . High cholesterol  . Thyroid disease  . Bruises easily/Rash  . Nausea vomiting and diarrhea  . Poor appetite  . Dizziness  . Weakness  . Fainting  . Generalized headaches  . Ear infection  . Sinus problem/runny nose  . Hoarseness/sore throat  . Wears glasses  . Arthritis  . 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 Hip pain  . Muscle weakness (generalized)  . Difficulty in walking    PT - End of Session Activity Tolerance: Patient tolerated treatment well General Behavior During Session: [facility] for tasks performed Cognition: [facility] for tasks performed  GP No functional reporting required  Juel Burrow, PTA 08/31/2011, 11:33 AM

## 2011-09-05 ENCOUNTER — Telehealth (HOSPITAL_COMMUNITY): Payer: Self-pay

## 2011-09-05 ENCOUNTER — Inpatient Hospital Stay (HOSPITAL_COMMUNITY): Admission: RE | Admit: 2011-09-05 | Payer: 59 | Source: Ambulatory Visit

## 2011-09-07 ENCOUNTER — Ambulatory Visit (HOSPITAL_COMMUNITY): Payer: 59 | Admitting: Physical Therapy

## 2011-09-12 ENCOUNTER — Ambulatory Visit (HOSPITAL_COMMUNITY)
Admission: RE | Admit: 2011-09-12 | Discharge: 2011-09-12 | Disposition: A | Discharge status: Home / Self Care | Payer: 59 | Source: Ambulatory Visit | Attending: Internal Medicine | Admitting: Internal Medicine

## 2011-09-12 DIAGNOSIS — R262 Difficulty in walking, not elsewhere classified: Secondary | ICD-10-CM | POA: Insufficient documentation

## 2011-09-12 DIAGNOSIS — M25659 Stiffness of unspecified hip, not elsewhere classified: Secondary | ICD-10-CM | POA: Insufficient documentation

## 2011-09-12 DIAGNOSIS — IMO0001 Reserved for inherently not codable concepts without codable children: Secondary | ICD-10-CM | POA: Insufficient documentation

## 2011-09-12 DIAGNOSIS — M25559 Pain in unspecified hip: Secondary | ICD-10-CM | POA: Insufficient documentation

## 2011-09-12 DIAGNOSIS — M6281 Muscle weakness (generalized): Secondary | ICD-10-CM | POA: Insufficient documentation

## 2011-09-12 NOTE — Evaluation (Signed)
Physical Therapy Re-Evaluation  Patient Details  Name: Andrew Love MRN: [id number] Date of Birth: 18-Feb-1966  Today's Date: 09/12/2011 Time: 7829-5621 PT Time Calculation (min): 40 min 1 MMT, 25' TE, 15' Manual Visit#: 8  of 16   Re-eval: 10/12/11 Assessment Next MD Visit: Dr. Dion Love - 10/05/11  Past Medical History:  Past Medical History  Diagnosis Date  . CAD (coronary artery disease)   . Elevated liver enzymes   . Diverticulitis     Hx of; requiring 3 admissions  . Sigmoid colon ulcer     Rectal polyps  . Thyroid disease   . Psoriasis     Per medical history form dated 06/13/10.  . Chronic insomnia     Per medical history form dated 06/13/10.  . Colitis     Per medical history from dated 06/13/10.  Marland Kitchen Hypertension   . Hypothyroidism   . Bronchitis     history of  . Back pain     chronic  . Arthritis   . 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 Colonoscopy 07/2008    Colitis,NSAID v. Ischemia,malrotation of the gut,Diverticulosis(L),hyperplastic  . Cardiac catheterization 2009  . Appendectomy   . Shoulder surgery     Left  . Rotator cuff repair     Left - per medical history form dated 06/13/10.  . Cardiac catheterization     Per medical history from dated 06/13/10.  . Thyroidectomy     Per medical history form dated 06/13/10.  . Colon surgery 2012  . Total hip arthroplasty 07/04/2011    Procedure: TOTAL HIP ARTHROPLASTY;  Surgeon: Andrew Post, MD;  Location: [facility] OR;  Service: Orthopedics;  Laterality: Right;    Subjective Symptoms/Limitations Symptoms: Pt reports that his main difficulty remains sitting more than 30-40 minutes in his car.  He has increased his walking to 1 mile.  He reports he only had pain a couple of times that he has had to to have pain medication.  Pt reports he continues to get stiff when sitting for long periods of time.   Today he reports he has just  returned for [place], and overall did pretty well and is happy with his progress he has made.  How long can you stand comfortably?: He reports that he has only been standing for 20 minutes, has not expirence any syncopal episodes. (45 minutes. He tried to stand/walk for an hour and became diaphretic and pail)  How long can you walk comfortably?: 1 mile about 30 minutes for exercise. Walking flat golf courses, has difficulty with going up and down hills  (less than an hour)  Pain Assessment Currently in Pain?: Yes Pain Score:   4 (Most of the day pain is about 4/10) Pain Location: Hip Pain Orientation: Right;Lateral (and lateral R knee pain to ITB) Pain Type: Acute pain  Precautions/Restrictions  Precautions Precautions: Posterior Hip  Sensation/Coordination/Flexibility/Functional Tests Functional Tests Functional Tests: LEFS: 42/80 (was 14/800  Assessment RLE Strength Right Hip Flexion: 4/5 (was 3/5) Right Hip Extension: 4/5 (was 3/5 ) Right Hip ABduction: 3+/5 (was 2+/5, today w/increased pain) Right Hip ADduction: 3/5 (was not taken initally) Right Knee Flexion: 5/5 (was 5/5) Right Knee Extension: 5/5 (was 5/5) Palpation Palpation: mild atrophy to R gluteal region, fascial restrcions to proximal and distal ITB with moderate reports of pain.   Mobility/Balance  Ambulation/Gait Ambulation/Gait: Yes Ambulation/Gait Assistance: 7: Independent Gait Pattern: Antalgic;Decreased hip/knee  flexion - right Stairs: Yes Stairs Assistance: 6: Modified independent (Device/Increase time) Stair Management Technique: One rail Right;Alternating pattern (w/forward flexion while ascending, proper mechanics descendi) Number of Stairs: 10  Height of Stairs: 6  Static Standing Balance Single Leg Stance - Right Leg: 37  (w/ proper mechanics)   Exercise/Treatments Standing Stairs: 1 RT w/1 handrail, increased lumbar flexion with ascending stairs. Supine Other Supine Knee Exercises: Isometric  hip flexion 5x10 sec holds Sidelying Hip ABduction: 10 reps (w/toe pointed up (hip ER)) Clams: 2x15 sec holds w/10 pulses at end of holds Prone  Other Prone Exercises: Heel Squeezes 5x10 sec    Manual Therapy Manual Therapy: Myofascial release Myofascial Release: To proximal and distal ITB (over scar to proximal hip) w/STM after to decrease fascial restricions and pain.   Physical Therapy Assessment and Plan PT Assessment and Plan Clinical Impression Statement: Mr. Andrew Love has attended 8 OP PT visitis for s/p R THR.  He has made improvements with his LE strength and endurance.  He continues to have mild pain to his lateral hip and into his knee (likely ITB/TFL tightness).  He reports his greatest limitations is sitting for any length of time (over 40 minutes) and difficulty walking up hills.  He has returned to golfing activities, but reports fear of full force swing.  He states that he believes he will be ready to go back to work and is looking forward to it.  Pt will benefit from skilled therapeutic intervention in order to improve on the following deficits: Abnormal gait;Decreased activity tolerance;Decreased balance;Decreased coordination;Decreased mobility;Decreased strength;Increased fascial restricitons;Impaired perceived functional ability;Impaired flexibility;Pain Rehab Potential: Good PT Frequency: Min 2X/week PT Duration: 4 weeks PT Treatment/Interventions: Gait training;Stair training;Functional mobility training;Therapeutic activities;Therapeutic exercise;Balance training;Neuromuscular re-education;Patient/family education;Other (comment) (Manual techniques and modalities for pain.) PT Plan: TM w/incline (5 min warm up and then spend 15-30 sec on incline up to 7 and come back down). cont with gluteal medius training and ascending stairs with appropriate posture.  Balance beam for high level balance activities, encourage proper mechanics with golf swing and correct pivot position  for R foot.     Goals Home Exercise Program Pt will Perform Home Exercise Program: Independently PT Goal: Perform Home Exercise Program - Progress: Met PT Short Term Goals Time to Complete Short Term Goals: 2 weeks PT Short Term Goal 1: Pt will report pain less than 4/10 for 50% of his day.  PT Short Term Goal 1 - Progress: Met PT Short Term Goal 2: Pt will improve his LE strength by 1/2 muscle grade.  PT Short Term Goal 2 - Progress: Partly met (Decreased gluteus medius strength) PT Short Term Goal 3: Pt will ambulate independently in a closed environment. PT Short Term Goal 3 - Progress: Met PT Short Term Goal 4: Pt will demonstrate L SLS x10 sec with appropriate posture on a static surface.  PT Short Term Goal 4 - Progress: Met PT Long Term Goals Time to Complete Long Term Goals: 8 weeks PT Long Term Goal 1: Pt will report pain less than 4/10 for 75% of his day for improved QOL PT Long Term Goal 1 - Progress: Met PT Long Term Goal 2: Pt will improve his LEFS to 40/80 for improved percieved functional ability. PT Long Term Goal 2 - Progress: Met (42/80) Long Term Goal 3: Pt will improve LE functional strength to [facility] and ascend and descend 10 stairs with 1 handrail with reciprocal pattern.  Long Term Goal 3 Progress: Partly met (improper  mechanics ascending stairs) Long Term Goal 4: Pt will improve his muscular endurance and tolerate standing/walking independently for greater than an hour in order to return to work related activities.  Long Term Goal 4 Progress: Progressing toward goal PT Long Term Goal 5: Pt will improve his dynamic balance and demonstrate independent gait in outdoor surfaces x15 minutes in order for safe return to golf activities.  Long Term Goal 5 Progress: Met (has played 5 times with reports difficulty w/hill climbs ) Additional PT Long Term Goals?: Yes PT Long Term Goal 6: NEW GOAL: 09/12/11: Pt will improve LE strength WFL in order to tolerate ambulating up hills  without an increase in pain in order to return fully to golf activities.  PT Long Term Goal 7: NEW GOAL 09/12/11: Pt will improve hip flexibility in order to tolerate sitting for greater than 40 minutes in his car.  Problem List Patient Active Problem List  Diagnoses  . RECTAL BLEEDING  . HEPATITIS  . DIARRHEA  . ABDOMINAL PAIN, LEFT LOWER QUADRANT  . LIVER FUNCTION TESTS, ABNORMAL, HX OF  . DIVERTICULITIS, HX OF  . Fever  . Night sweats  . Chills  . Insomnia  . High blood pressure  . Poor circulation  . Cough with sputum  . High cholesterol  . Thyroid disease  . Bruises easily/Rash  . Nausea vomiting and diarrhea  . Poor appetite  . Dizziness  . Weakness  . Fainting  . Generalized headaches  . Ear infection  . Sinus problem/runny nose  . Hoarseness/sore throat  . Wears glasses  . Arthritis  . 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 Hip pain  . Muscle weakness (generalized)  . Difficulty in walking    PT - End of Session Activity Tolerance: Patient tolerated treatment well  Boone Gear 09/12/2011, 11:01 AM  Physician Documentation Your signature is required to indicate approval of the treatment plan as stated above.  Please sign and either send electronically or make a copy of this report for your files and return this physician signed original.   Please mark one 1.__approve of plan  2. ___approve of plan with the following conditions.   ______________________________                                                          _____________________ Physician Signature                                                                                                             Date

## 2011-09-15 ENCOUNTER — Ambulatory Visit (HOSPITAL_COMMUNITY)
Admission: RE | Admit: 2011-09-15 | Discharge: 2011-09-15 | Disposition: A | Discharge status: Home / Self Care | Payer: 59 | Source: Ambulatory Visit

## 2011-09-15 NOTE — Progress Notes (Signed)
Physical Therapy Treatment Patient Details  Name: Andrew Love MRN: [id number] Date of Birth: April 20, 1966  Today's Date: 09/15/2011 Time: 4098-1191 PT Time Calculation (min): 50 min  Visit#: 9  of 16   Re-eval: 10/12/11 Assessment Diagnosis: R THA Surgical Date: 07/04/11 Next MD Visit: Dr. Dion Saucier - 10/05/11 Charge: therex 38 min NMR 12 min   Subjective: Symptoms/Limitations Symptoms: Pt stated his son was sick and may need to leave early if gets call.  Pt stated min pain maybe 1/10.  Pt reported golf swing needs to improve Pain Assessment Currently in Pain?: Yes Pain Score:   1 Pain Location: Hip Pain Orientation: Right  Precautions/Restrictions  Precautions Precautions: Posterior Hip  Exercise/Treatments Aerobic Tread Mill: TM @ 2.0 incline 3 x 5 min with 30" incline to 7 every minute: total 10 min Standing Functional Squat: 15 reps Stairs: 2 RT w/1 handrail, increased lumbar flexion with ascending stairs. Other Standing Knee Exercises: Tandem/Retro tandem gait on balance beam x 2RT Other Standing Knee Exercises: tband/ 5# dowel rod x 20 reps with cueing for proper technique Sidelying Hip ABduction: 10 reps;Limitations Hip ABduction Limitations: pillow between knees pt stated glut pain with ER Clams: 10x 10 sec holds; 10 reps 10 pulses      Physical Therapy Assessment and Plan PT Assessment and Plan Clinical Impression Statement: Pt tolerated well with increased time and different inclines on TM required cueing to reduce antalgic gait with fatigue noted with increased time.  Pt with most difficulty with golf swing, required cueing for form and technique.   PT Plan: Continue with TM w/ incline, strengthening for glut med and ascending stairs without the lumbar flexion, high level balance activiteis and proper mechanics with golf swing.    Goals    Problem List Patient Active Problem List  Diagnoses  . RECTAL BLEEDING  . HEPATITIS  . DIARRHEA  . ABDOMINAL  PAIN, LEFT LOWER QUADRANT  . LIVER FUNCTION TESTS, ABNORMAL, HX OF  . DIVERTICULITIS, HX OF  . Fever  . Night sweats  . Chills  . Insomnia  . High blood pressure  . Poor circulation  . Cough with sputum  . High cholesterol  . Thyroid disease  . Bruises easily/Rash  . Nausea vomiting and diarrhea  . Poor appetite  . Dizziness  . Weakness  . Fainting  . Generalized headaches  . Ear infection  . Sinus problem/runny nose  . Hoarseness/sore throat  . Wears glasses  . Arthritis  . 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 Hip pain  . Muscle weakness (generalized)  . Difficulty in walking    PT - End of Session Activity Tolerance: Patient tolerated treatment well General Behavior During Session: [facility] for tasks performed Cognition: [facility] for tasks performed  GP No functional reporting required  Juel Burrow, PTA 09/15/2011, 12:16 PM

## 2011-09-19 ENCOUNTER — Ambulatory Visit (HOSPITAL_COMMUNITY)
Admission: RE | Admit: 2011-09-19 | Discharge: 2011-09-19 | Disposition: A | Discharge status: Home / Self Care | Payer: 59 | Source: Ambulatory Visit | Attending: Internal Medicine | Admitting: Internal Medicine

## 2011-09-19 NOTE — Progress Notes (Signed)
Physical Therapy Treatment Patient Details  Name: Andrew Love MRN: [id number] Date of Birth: Dec 25, 1965  Today's Date: 09/19/2011 Time: 6440-3474 PT Time Calculation (min): 44 min  Visit#: 10  of 16   Re-eval: 10/12/11  Charge: therex 29 min Gait 15 min  Subjective: Symptoms/Limitations Symptoms: Pt stated he was really sore yesterday, feels better today. How long can you sit comfortably?: Car: discomfort riding following 15 minutes Pain Assessment Currently in Pain?: No/denies  Objective:   Exercise/Treatments Aerobic Tread Mill: TM @ 2.0 incline 3 x 5 min with 30" incline to 7 every minute: total 10 min Machines for Strengthening Cybex Knee Extension: 4Pl 2x 15 B extend R eccentric lowering Cybex Knee Flexion: 5PL 2x 15 Cybex Leg Press: 8Pl 2x 15 Standing Stairs: 2RT w/ 1 HR good posture and gait mechanics SLS with Vectors: 5x 10" Rebounder: 20 reps yellow ball Other Standing Knee Exercises: Tandem/Retro tandem gait on balance beam x 4RT Other Standing Knee Exercises: 1# dowel rod with ball 10     Physical Therapy Assessment and Plan PT Assessment and Plan Clinical Impression Statement: Gait mechanics improving following vc-ing to reduce antalgic gait with different inclines on TM and pt able to demonstrate appropriate posture and gait mechanics with stairwell today with no cueing required to reduce lumbar flexion while ascending PT Plan: Continue with TM w/ incline, strengthening for glut med and ascending stairs without the lumbar flexion, high level balance activiteis and proper mechanics with golf swing    Goals    Problem List Patient Active Problem List  Diagnoses  . RECTAL BLEEDING  . HEPATITIS  . DIARRHEA  . ABDOMINAL PAIN, LEFT LOWER QUADRANT  . LIVER FUNCTION TESTS, ABNORMAL, HX OF  . DIVERTICULITIS, HX OF  . Fever  . Night sweats  . Chills  . Insomnia  . High blood pressure  . Poor circulation  . Cough with sputum  . High cholesterol  .  Thyroid disease  . Bruises easily/Rash  . Nausea vomiting and diarrhea  . Poor appetite  . Dizziness  . Weakness  . Fainting  . Generalized headaches  . Ear infection  . Sinus problem/runny nose  . Hoarseness/sore throat  . Wears glasses  . Arthritis  . 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 Hip pain  . Muscle weakness (generalized)  . Difficulty in walking    PT - End of Session Activity Tolerance: Patient tolerated treatment well General Behavior During Session: [facility] for tasks performed Cognition: [facility] for tasks performed  GP No functional reporting required  Juel Burrow, PTA 09/19/2011, 9:51 AM

## 2011-09-21 ENCOUNTER — Ambulatory Visit (HOSPITAL_COMMUNITY)
Admission: RE | Admit: 2011-09-21 | Discharge: 2011-09-21 | Disposition: A | Discharge status: Home / Self Care | Payer: 59 | Source: Ambulatory Visit | Attending: Internal Medicine | Admitting: Internal Medicine

## 2011-09-21 NOTE — Progress Notes (Signed)
Physical Therapy Treatment Patient Details  Name: Andrew Love MRN: [id number] Date of Birth: 05/07/1966  Today's Date: 09/21/2011 Time: 4782-9562 PT Time Calculation (min): 45 min  Visit#: 11  of 16   Re-eval: 10/12/11  Charge: gait 15 min therex 30 min  Subjective: Symptoms/Limitations Symptoms: Pt reported rough night last night, could not get to sleep due to cramps R thigh and calves.  No pain at entrance to dept today.  Pt reported he played his best golf following last session, felt loose and stronger was able to hit the ball faster too. Pain Assessment Currently in Pain?: No/denies  Objective:   Exercise/Treatments Stretches Hip Flexor Stretch: 3 reps;30 seconds;Limitations Hip Flexor Stretch Limitations: BLE standing Gastroc Stretch: 3 reps;30 seconds Aerobic Tread Mill: TM @ 2.0 incline 3 x 5 min with 30" incline to 7 every minute: total 10 min Machines for Strengthening Cybex Knee Extension: 4Pl 2x 15 B extend R eccentric lowering Cybex Knee Flexion: 5PL 2x 15 Cybex Leg Press: 8Pl 2x 15 Standing Stairs: 2RT w/ no HR good posture and gait mechanics Golf swing with appropriate mechanics with SPC and blue ball 10 reps Seated Other Seated Knee Exercises: adductor stretch 3x 30"     Physical Therapy Assessment and Plan PT Assessment and Plan Clinical Impression Statement: Session focus on flexibility and proper posture/gait mechanics.  Pt instructed adductor stretch following the hip replacement precautions, pt reported it reduced cramps and stated he would add to HEP.  Good posture noted while ascending stairs this session, no cueing required to reduce lumbar flexion while ascending and pt able to demonstrate with no handrails and safe gait mechanics. PT Plan: Continue with TM w/ incline, strengthening for glut med and ascending stairs without the lumbar flexion, high level balance activiteis and proper mechanics with golf swing    Goals    Problem  List Patient Active Problem List  Diagnoses  . RECTAL BLEEDING  . HEPATITIS  . DIARRHEA  . ABDOMINAL PAIN, LEFT LOWER QUADRANT  . LIVER FUNCTION TESTS, ABNORMAL, HX OF  . DIVERTICULITIS, HX OF  . Fever  . Night sweats  . Chills  . Insomnia  . High blood pressure  . Poor circulation  . Cough with sputum  . High cholesterol  . Thyroid disease  . Bruises easily/Rash  . Nausea vomiting and diarrhea  . Poor appetite  . Dizziness  . Weakness  . Fainting  . Generalized headaches  . Ear infection  . Sinus problem/runny nose  . Hoarseness/sore throat  . Wears glasses  . Arthritis  . 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 Hip pain  . Muscle weakness (generalized)  . Difficulty in walking    PT - End of Session Activity Tolerance: Patient tolerated treatment well General Behavior During Session: [facility] for tasks performed Cognition: [facility] for tasks performed  GP No functional reporting required  Juel Burrow, PTA 09/21/2011, 10:12 AM

## 2011-09-26 ENCOUNTER — Ambulatory Visit (HOSPITAL_COMMUNITY)
Admission: RE | Admit: 2011-09-26 | Discharge: 2011-09-26 | Disposition: A | Discharge status: Home / Self Care | Payer: 59 | Source: Ambulatory Visit | Attending: Internal Medicine | Admitting: Internal Medicine

## 2011-09-26 NOTE — Progress Notes (Signed)
Physical Therapy Treatment Patient Details  Name: KENG JEWEL MRN: [id number] Date of Birth: October 22, 1965  Today's Date: 09/26/2011 Time: 4098-1191 PT Time Calculation (min): 30 min Visit#: 12  of 16   Re-eval: 10/12/11 Charges:  Gait 12', therex 16'    Subjective: Symptoms/Limitations Symptoms: Pt. states he has to leave early for an appt. in [place]. States he's having a good day today, not hurting. Pain Assessment Currently in Pain?: No/denies   Exercise/Treatments Aerobic Tread Mill: TM @ 2.0 incline 3 x 5 min with 30" incline to 7 every minute: total 10 min Machines for Strengthening Cybex Knee Extension: 4Pl 2x 15 B extend R eccentric lowering Cybex Knee Flexion: 5PL 2x 15 Cybex Leg Press: 8Pl 2x 15 Standing Stairs: 2RT w/ no HR good posture and gait mechanics SLS with Vectors: 5x 10"   Physical Therapy Assessment and Plan PT Assessment and Plan Clinical Impression Statement: Unable to complete session due to pt. having to leave early.  States he's having more difficulty sleeping lately due to R hip discomfort.  Pt. now Independent with stair negotiation reciprocally.  Improved definition of mm in R LE with overall increasing strength. PT Plan: Continue; increase to high level balance activties.      PT - End of Session Activity Tolerance: Patient tolerated treatment well General Behavior During Session: [facility] for tasks performed Cognition: [facility] for tasks performed   Yehudis Monceaux B. Bascom Levels, PTA 09/26/2011, 9:27 AM

## 2011-09-28 ENCOUNTER — Ambulatory Visit (HOSPITAL_COMMUNITY): Payer: 59

## 2011-10-03 ENCOUNTER — Ambulatory Visit (HOSPITAL_COMMUNITY)
Admission: RE | Admit: 2011-10-03 | Discharge: 2011-10-03 | Disposition: A | Discharge status: Home / Self Care | Payer: 59 | Source: Ambulatory Visit | Attending: Internal Medicine | Admitting: Internal Medicine

## 2011-10-03 NOTE — Evaluation (Signed)
Physical Therapy Re-Evaluation  Patient Details  Name: Andrew Love MRN: [id number] Date of Birth: [birth year]  Today's Date: 10/03/2011 Time: 1308-6578 PT Time Calculation (min): 46 min Charges: 1 ROM, 1 MMT, 15' TE, 15' Gait Visit#: 13  of 16   Re-eval:   Assessment Diagnosis: R THA Surgical Date: 07/04/11 Next MD Visit: Dr. Dion Saucier - 10/05/11  Past Medical History:  Past Medical History  Diagnosis Date  . CAD (coronary artery disease)   . Elevated liver enzymes   . Diverticulitis     Hx of; requiring 3 admissions  . Sigmoid colon ulcer     Rectal polyps  . Thyroid disease   . Psoriasis     Per medical history form dated 06/13/10.  . Chronic insomnia     Per medical history form dated 06/13/10.  . Colitis     Per medical history from dated 06/13/10.  Marland Kitchen Hypertension   . Hypothyroidism   . Bronchitis     history of  . Back pain     chronic  . Arthritis   . 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 Colonoscopy 07/2008    Colitis,NSAID v. Ischemia,malrotation of the gut,Diverticulosis(L),hyperplastic  . Cardiac catheterization 2009  . Appendectomy   . Shoulder surgery     Left  . Rotator cuff repair     Left - per medical history form dated 06/13/10.  . Cardiac catheterization     Per medical history from dated 06/13/10.  . Thyroidectomy     Per medical history form dated 06/13/10.  . Colon surgery 2012  . Total hip arthroplasty 07/04/2011    Procedure: TOTAL HIP ARTHROPLASTY;  Surgeon: Eulas Post, MD;  Location: [facility] OR;  Service: Orthopedics;  Laterality: Right;    Subjective Symptoms/Limitations Symptoms: I am a little concerned still about sitting for long periods of time and then being stiff afterwards. Most difficulty is pain with rolling, he recently has been placed back on Ambien by his PCP for sleep.  He continues to ice his hip.  He works out at his gym while he attends  work (4x/week) How long can you sit comfortably?: Recliner: 15 minutes, Car: 15 minutes. Work he has bought a cushion for his office chair. (was 15 minutes in a car) How long can you stand comfortably?: about 60 minutes (standing for 20 minutes, has not expirence any syncopal episodes) How long can you walk comfortably?: About 1 hour on flat golf course, some discomfort with hills, but easier than before.  (1 mile about 30 minutes for exercise. Walking flat golf courses) Pain Assessment Pain Score: 0-No pain (on average 2/10)  Precautions/Restrictions  Precautions Precautions: Posterior Hip  Cognition/Observation Observation/Other Assessments Observations: decreased R quadricep flexibility; R pes planus, decreased toe lift during heel strike, decreased R shoulder swing during gait  Assessment RLE AROM (degrees) RLE Overall AROM Comments: Hip IR decreasd 90% RLE Strength Right Hip Flexion: 4/5 (was 4/5) Right Hip Extension: 5/5 (was 4/5) Right Hip ABduction: 4/5 (was 3+/5) Right Hip ADduction: 4/5 (was 3/5) Right Knee Flexion: 5/5 (was 5/5) Right Knee Extension: 5/5 (was 5/5) Right Ankle Inversion: 4/5 Palpation Palpation: improved gluteal tone and quad tone, however quad tone still decreased overall  Exercise/Treatments Stretches Quad Stretch: 3 reps;Other (comment) (2 minute holds) Standing Gait Training: x10 minutes up and down w/VC's for foot, trunk and arm motion.  Seated Other Seated Knee Exercises: Heel and  Toe Roll in and outs 5x10 sec holds, concentration on roll in Other Seated Knee Exercises: R ankle raise (to top of L knee, putting on shoe motion) x6  Physical Therapy Assessment and Plan PT Assessment and Plan Clinical Impression Statement: Re-evaluation complete today.  Andrew Love has attended 16 OP PT visits.  He has met all of his STG and all by 1 LTG.  He has gained significant strength and imrpoved his overall quality of movement.  continues to have greatest  deficit with impaired LE hip and knee flexibility and improper motor control during gait.  After treatment today had improved gait and reports of decreased stiffness.  Will benefit from 2 more weeks of PT to address remaining gait and flexibility concerns for safe return to work.  Pt will benefit from skilled therapeutic intervention in order to improve on the following deficits: Abnormal gait;Decreased mobility;Impaired flexibility Rehab Potential: Good PT Frequency: Min 2X/week PT Duration: Other (comment) (2 weeks) PT Treatment/Interventions: Gait training;Neuromuscular re-education PT Plan: Cont with focus on gait taining and flexibility.    Goals Home Exercise Program Pt will Perform Home Exercise Program: Independently PT Goal: Perform Home Exercise Program - Progress: Met PT Short Term Goals Time to Complete Short Term Goals: 2 weeks PT Short Term Goal 1: Pt will report pain less than 4/10 for 50% of his day.  PT Short Term Goal 1 - Progress: Met PT Short Term Goal 2: Pt will improve his LE strength by 1/2 muscle grade.  PT Short Term Goal 3: Pt will ambulate independently in a closed environment. PT Short Term Goal 3 - Progress: Met PT Short Term Goal 4: Pt will demonstrate L SLS x10 sec with appropriate posture on a static surface.  PT Short Term Goal 4 - Progress: Met PT Long Term Goals Time to Complete Long Term Goals: 8 weeks PT Long Term Goal 1: Pt will report pain less than 4/10 for 75% of his day for improved QOL PT Long Term Goal 1 - Progress: Met PT Long Term Goal 2: Pt will improve his LEFS to 40/80 for improved percieved functional ability. Long Term Goal 3: Pt will improve LE functional strength to [facility] and ascend and descend 10 stairs with 1 handrail with reciprocal pattern.  Long Term Goal 3 Progress: Met Long Term Goal 4: Pt will improve his muscular endurance and tolerate standing/walking independently for greater than an hour in order to return to work related  activities.  Long Term Goal 4 Progress: Met PT Long Term Goal 5: Pt will improve his dynamic balance and demonstrate independent gait in outdoor surfaces x15 minutes in order for safe return to golf activities.  Long Term Goal 5 Progress: Met Additional PT Long Term Goals?: Yes PT Long Term Goal 6: NEW GOAL: 09/12/11: Pt will improve LE strength WFL in order to tolerate ambulating up hills without an increase in pain in order to return fully to golf activities.  Long Term Goal 6 Progress: Met (has increased caution about slipping and falling) PT Long Term Goal 7: NEW GOAL 09/12/11: Pt will improve hip flexibility in order to tolerate sitting for greater than 40 minutes in his car. Long Term Goal 7 Progress: Not met  Problem List Patient Active Problem List  Diagnoses  . RECTAL BLEEDING  . HEPATITIS  . DIARRHEA  . ABDOMINAL PAIN, LEFT LOWER QUADRANT  . LIVER FUNCTION TESTS, ABNORMAL, HX OF  . DIVERTICULITIS, HX OF  . Fever  . Night sweats  .  Chills  . Insomnia  . High blood pressure  . Poor circulation  . Cough with sputum  . High cholesterol  . Thyroid disease  . Bruises easily/Rash  . Nausea vomiting and diarrhea  . Poor appetite  . Dizziness  . Weakness  . Fainting  . Generalized headaches  . Ear infection  . Sinus problem/runny nose  . Hoarseness/sore throat  . Wears glasses  . Arthritis  . 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 Hip pain  . Muscle weakness (generalized)  . Difficulty in walking    PT - End of Session Activity Tolerance: Patient tolerated treatment well PT Plan of Care PT Home Exercise Plan: Added heel/toe roll in and outs, clams, quad stretch, gait mechanics PT Patient Instructions: on proper gait mechanics and body awareness.  recommend OTC foot orthotic for R pes planus Consulted and Agree with Plan of Care: Patient  Andrew Love 10/03/2011, 12:21 PM  Physician Documentation Your signature is required to indicate approval of the  treatment plan as stated above.  Please sign and either send electronically or make a copy of this report for your files and return this physician signed original.   Please mark one 1.__approve of plan  2. ___approve of plan with the following conditions.   ______________________________                                                          _____________________ Physician Signature                                                                                                             Date

## 2011-10-05 ENCOUNTER — Ambulatory Visit (HOSPITAL_COMMUNITY)
Admission: RE | Admit: 2011-10-05 | Discharge: 2011-10-05 | Disposition: A | Discharge status: Home / Self Care | Payer: 59 | Source: Ambulatory Visit | Attending: Internal Medicine | Admitting: Internal Medicine

## 2011-10-05 NOTE — Progress Notes (Signed)
Physical Therapy Treatment Patient Details  Name: BURAK ZERBE MRN: [id number] Date of Birth: 1965-12-25  Today's Date: 10/05/2011 Time: 0850-0932 PT Time Calculation (min): 42 min Charges: 42 TE Visit#: 14  of 20   Re-eval: 10/19/11     Subjective: Symptoms/Limitations Symptoms: Pt reports that he is practicing walking correctly for the last few days.  He reports that he feels he is doing well.  He reports he is about 85% better overall.  He feels he is very tight and stiff overall.  He is sleeping better with Ambien.  He had increased stiffness when he drove home after last session.  Pain Assessment Currently in Pain?: Yes Pain Score:   1 Pain Location: Hip Pain Orientation: Right  Precautions/Restrictions     Exercise/Treatments  Stretches Quad Stretch: 3 reps;60 seconds Hip Flexor Stretch: 3 reps;60 seconds Hip Flexor Stretch Limitations: BLE standing ITB Stretch: 60 seconds;3 reps (R, Standing; Manually 2x30 sec supine) Aerobic Tread Mill: TM 2.0 inclin 4.0 x10 minutes.  Standing Gait Training: 5 RT with proper gait mechanics for shoulder, trunk and foot Seated Other Seated Knee Exercises: Heel and Toe Roll in and outs 5x10 sec holds, concentration on roll in Prone  Other Prone Exercises: Hip IR and ER stretch 5 sec holds 10x Sidelying Clams: 5x15 sec holds RLE  Physical Therapy Assessment and Plan PT Assessment and Plan Clinical Impression Statement: Pt continues to report increased stiffness after riding in a car for short distances and has notable decrease in hip IR and ER, rectus femoris length and ITB length secondary to inital hip percautions.   He has greatly improved his overall mechanics and coordination with gait and is able t self correct.  Added Standing ITB stretch, manual ober stretch,  prone ER and IR stretch PT Duration:  (2 weeks) PT Plan: Cont with focus on gait taining and flexibility.    Goals    Problem List Patient Active Problem List   Diagnoses  . RECTAL BLEEDING  . HEPATITIS  . DIARRHEA  . ABDOMINAL PAIN, LEFT LOWER QUADRANT  . LIVER FUNCTION TESTS, ABNORMAL, HX OF  . DIVERTICULITIS, HX OF  . Fever  . Night sweats  . Chills  . Insomnia  . High blood pressure  . Poor circulation  . Cough with sputum  . High cholesterol  . Thyroid disease  . Bruises easily/Rash  . Nausea vomiting and diarrhea  . Poor appetite  . Dizziness  . Weakness  . Fainting  . Generalized headaches  . Ear infection  . Sinus problem/runny nose  . Hoarseness/sore throat  . Wears glasses  . Arthritis  . 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 Hip pain  . Muscle weakness (generalized)  . Difficulty in walking    PT - End of Session Activity Tolerance: Patient tolerated treatment well  GP No functional reporting required  Alyda Megna 10/05/2011, 9:45 AM

## 2011-11-27 ENCOUNTER — Encounter (HOSPITAL_COMMUNITY): Payer: Self-pay | Admitting: Emergency Medicine

## 2011-11-27 ENCOUNTER — Observation Stay (HOSPITAL_COMMUNITY)
Admission: EM | Admit: 2011-11-27 | Discharge: 2011-11-29 | Disposition: A | Discharge status: Home / Self Care | Payer: 59 | Attending: Internal Medicine | Admitting: Internal Medicine

## 2011-11-27 ENCOUNTER — Emergency Department (HOSPITAL_COMMUNITY): Payer: 59

## 2011-11-27 DIAGNOSIS — R079 Chest pain, unspecified: Principal | ICD-10-CM | POA: Diagnosis present

## 2011-11-27 DIAGNOSIS — I2510 Atherosclerotic heart disease of native coronary artery without angina pectoris: Secondary | ICD-10-CM | POA: Diagnosis present

## 2011-11-27 DIAGNOSIS — E079 Disorder of thyroid, unspecified: Secondary | ICD-10-CM

## 2011-11-27 DIAGNOSIS — K759 Inflammatory liver disease, unspecified: Secondary | ICD-10-CM | POA: Diagnosis present

## 2011-11-27 DIAGNOSIS — I10 Essential (primary) hypertension: Secondary | ICD-10-CM | POA: Diagnosis present

## 2011-11-27 DIAGNOSIS — E7800 Pure hypercholesterolemia, unspecified: Secondary | ICD-10-CM

## 2011-11-27 DIAGNOSIS — F17200 Nicotine dependence, unspecified, uncomplicated: Secondary | ICD-10-CM | POA: Insufficient documentation

## 2011-11-27 MED ORDER — NITROGLYCERIN 0.4 MG SL SUBL
0.4000 mg | SUBLINGUAL_TABLET | SUBLINGUAL | Status: DC | PRN
  Administered 2011-11-28 (×2): 0.4 mg via SUBLINGUAL

## 2011-11-27 MED ORDER — MORPHINE SULFATE 4 MG/ML IJ SOLN
4.0000 mg | Freq: Once | INTRAMUSCULAR | Status: AC
  Administered 2011-11-28: 4 mg via INTRAVENOUS
  Filled 2011-11-27: qty 1

## 2011-11-27 MED ORDER — ASPIRIN 81 MG PO CHEW: 324.0000 mg | CHEWABLE_TABLET | Freq: Once | ORAL | Status: DC

## 2011-11-27 MED ORDER — ONDANSETRON HCL 4 MG/2ML IJ SOLN
4.0000 mg | Freq: Once | INTRAMUSCULAR | Status: AC
  Administered 2011-11-28: 4 mg via INTRAVENOUS
  Filled 2011-11-27: qty 2

## 2011-11-27 NOTE — ED Notes (Signed)
Patient complaining of left-sided chest pain that started at approximately 2030 tonight. Also complaining of nausea, shortness of breath, dizziness, and feeling shaky. Also reports feeling flushed and constant left-sided arm numbness and tingling.
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
## 2011-11-28 ENCOUNTER — Encounter (HOSPITAL_COMMUNITY): Payer: Self-pay | Admitting: Adult Health

## 2011-11-28 DIAGNOSIS — R079 Chest pain, unspecified: Secondary | ICD-10-CM

## 2011-11-28 DIAGNOSIS — I10 Essential (primary) hypertension: Secondary | ICD-10-CM

## 2011-11-28 DIAGNOSIS — I2510 Atherosclerotic heart disease of native coronary artery without angina pectoris: Secondary | ICD-10-CM

## 2011-11-28 DIAGNOSIS — E7800 Pure hypercholesterolemia, unspecified: Secondary | ICD-10-CM

## 2011-11-28 LAB — POCT I-STAT TROPONIN I: Troponin i, poc: 0 ng/mL (ref 0.00–0.08)

## 2011-11-28 LAB — COMPREHENSIVE METABOLIC PANEL
ALT: 32 U/L (ref 0–53)
AST: 22 U/L (ref 0–37)
Albumin: 4.1 g/dL (ref 3.5–5.2)
Alkaline Phosphatase: 105 U/L (ref 39–117)
BUN: 11 mg/dL (ref 6–23)
CO2: 25 mEq/L (ref 19–32)
Calcium: 9.8 mg/dL (ref 8.4–10.5)
Chloride: 103 mEq/L (ref 96–112)
Creatinine, Ser: 0.99 mg/dL (ref 0.50–1.35)
GFR calc Af Amer: >90 mL/min (ref >90)
GFR calc non Af Amer: >90 mL/min (ref >90)
Glucose, Bld: 104 mg/dL — ABNORMAL HIGH (ref 70–99)
Potassium: 3.4 mEq/L — ABNORMAL LOW (ref 3.5–5.1)
Sodium: 138 mEq/L (ref 135–145)
Total Bilirubin: 0.4 mg/dL (ref 0.3–1.2)
Total Protein: 7.4 g/dL (ref 6.0–8.3)

## 2011-11-28 LAB — POCT I-STAT, CHEM 8
BUN: 10 mg/dL (ref 6–23)
Calcium, Ion: 1.21 mmol/L (ref 1.12–1.23)
Chloride: 107 mEq/L (ref 96–112)
Creatinine, Ser: 1.1 mg/dL (ref 0.50–1.35)
Glucose, Bld: 101 mg/dL — ABNORMAL HIGH (ref 70–99)
HCT: 47 % (ref 39.0–52.0)
Hemoglobin: 16 g/dL (ref 13.0–17.0)
Potassium: 3.4 mEq/L — ABNORMAL LOW (ref 3.5–5.1)
Sodium: 141 mEq/L (ref 135–145)
TCO2: 22 mmol/L (ref 0–100)

## 2011-11-28 LAB — CARDIAC PANEL(CRET KIN+CKTOT+MB+TROPI)
CK, MB: 1.6 ng/mL (ref 0.3–4.0)
CK, MB: 1.6 ng/mL (ref 0.3–4.0)
CK, MB: 1.5 ng/mL (ref 0.3–4.0)
Relative Index: 1 (ref 0.0–2.5)
Relative Index: 1.2 (ref 0.0–2.5)
Relative Index: 1.2 (ref 0.0–2.5)
Total CK: 161 U/L (ref 7–232)
Total CK: 139 U/L (ref 7–232)
Total CK: 121 U/L (ref 7–232)
Troponin I: <0.3 ng/mL (ref <0.30)
Troponin I: <0.3 ng/mL (ref <0.30)
Troponin I: <0.3 ng/mL (ref <0.30)

## 2011-11-28 LAB — CBC
HCT: 45.5 % (ref 39.0–52.0)
Hemoglobin: 16.4 g/dL (ref 13.0–17.0)
MCH: 32.9 pg (ref 26.0–34.0)
MCHC: 36 g/dL (ref 30.0–36.0)
MCV: 91.4 fL (ref 78.0–100.0)
Platelets: 245 10*3/uL (ref 150–400)
RBC: 4.98 MIL/uL (ref 4.22–5.81)
RDW: 12.9 % (ref 11.5–15.5)
WBC: 12.1 10*3/uL — ABNORMAL HIGH (ref 4.0–10.5)

## 2011-11-28 LAB — LIPID PANEL
Cholesterol: 190 mg/dL (ref 0–200)
HDL: 24 mg/dL — ABNORMAL LOW (ref >39)
LDL Cholesterol: UNDETERMINED mg/dL (ref 0–99)
Total CHOL/HDL Ratio: 7.9 RATIO
Triglycerides: 500 mg/dL — ABNORMAL HIGH (ref <150)
VLDL: UNDETERMINED mg/dL (ref 0–40)

## 2011-11-28 LAB — D-DIMER, QUANTITATIVE: D-Dimer, Quant: 0.23 ug/mL-FEU (ref 0.00–0.48)

## 2011-11-28 MED ORDER — SODIUM CHLORIDE 0.9 % IJ SOLN
3.0000 mL | INTRAMUSCULAR | Status: DC | PRN
  Filled 2011-11-28: qty 3

## 2011-11-28 MED ORDER — ASPIRIN 81 MG PO CHEW
324.0000 mg | CHEWABLE_TABLET | ORAL | Status: AC
  Administered 2011-11-28: 324 mg via ORAL
  Filled 2011-11-28: qty 3
  Filled 2011-11-28: qty 1

## 2011-11-28 MED ORDER — ENOXAPARIN SODIUM 100 MG/ML ~~LOC~~ SOLN
100.0000 mg | Freq: Once | SUBCUTANEOUS | Status: AC
  Administered 2011-11-28: 100 mg via SUBCUTANEOUS
  Filled 2011-11-28: qty 1

## 2011-11-28 MED ORDER — ENOXAPARIN SODIUM 100 MG/ML ~~LOC~~ SOLN: 100.0000 mg | Freq: Two times a day (BID) | SUBCUTANEOUS | Status: DC

## 2011-11-28 MED ORDER — ENOXAPARIN SODIUM 40 MG/0.4ML ~~LOC~~ SOLN
40.0000 mg | SUBCUTANEOUS | Status: DC
  Administered 2011-11-29: 40 mg via SUBCUTANEOUS
  Filled 2011-11-28: qty 0.4

## 2011-11-28 MED ORDER — ASPIRIN EC 81 MG PO TBEC
81.0000 mg | DELAYED_RELEASE_TABLET | Freq: Every day | ORAL | Status: DC
  Administered 2011-11-29: 81 mg via ORAL
  Filled 2011-11-28: qty 1

## 2011-11-28 MED ORDER — SODIUM CHLORIDE 0.9 % IJ SOLN
3.0000 mL | Freq: Two times a day (BID) | INTRAMUSCULAR | Status: DC
  Administered 2011-11-28 – 2011-11-29 (×3): 3 mL via INTRAVENOUS
  Filled 2011-11-28 (×2): qty 3

## 2011-11-28 MED ORDER — SODIUM CHLORIDE 0.9 % IV SOLN: 250.0000 mL | INTRAVENOUS | Status: DC | PRN

## 2011-11-28 MED ORDER — POTASSIUM CHLORIDE CRYS ER 20 MEQ PO TBCR
20.0000 meq | EXTENDED_RELEASE_TABLET | Freq: Every day | ORAL | Status: DC
  Administered 2011-11-28 – 2011-11-29 (×2): 20 meq via ORAL
  Filled 2011-11-28 (×2): qty 1

## 2011-11-28 MED ORDER — NITROGLYCERIN 0.4 MG SL SUBL: 0.4000 mg | SUBLINGUAL_TABLET | SUBLINGUAL | Status: DC | PRN

## 2011-11-28 MED ORDER — LEVOTHYROXINE SODIUM 50 MCG PO TABS
50.0000 ug | ORAL_TABLET | Freq: Every day | ORAL | Status: DC
  Administered 2011-11-28: 50 ug via ORAL
  Filled 2011-11-28: qty 1

## 2011-11-28 MED ORDER — AMLODIPINE BESYLATE 5 MG PO TABS
5.0000 mg | ORAL_TABLET | Freq: Every day | ORAL | Status: DC
  Administered 2011-11-28 – 2011-11-29 (×2): 5 mg via ORAL
  Filled 2011-11-28 (×2): qty 1

## 2011-11-28 MED ORDER — ACETAMINOPHEN 325 MG PO TABS: 650.0000 mg | ORAL_TABLET | ORAL | Status: DC | PRN

## 2011-11-28 MED ORDER — ASPIRIN 300 MG RE SUPP
300.0000 mg | RECTAL | Status: AC
  Filled 2011-11-28: qty 1

## 2011-11-28 MED ORDER — LEVOTHYROXINE SODIUM 100 MCG PO TABS
200.0000 ug | ORAL_TABLET | Freq: Every day | ORAL | Status: DC
  Administered 2011-11-28: 200 ug via ORAL
  Filled 2011-11-28: qty 2

## 2011-11-28 MED ORDER — IRBESARTAN 75 MG PO TABS
75.0000 mg | ORAL_TABLET | Freq: Every day | ORAL | Status: DC
  Administered 2011-11-28 – 2011-11-29 (×2): 75 mg via ORAL
  Filled 2011-11-28 (×2): qty 1

## 2011-11-28 MED ORDER — ONDANSETRON HCL 4 MG/2ML IJ SOLN
4.0000 mg | Freq: Four times a day (QID) | INTRAMUSCULAR | Status: DC | PRN
  Administered 2011-11-28: 4 mg via INTRAVENOUS
  Filled 2011-11-28: qty 2

## 2011-11-28 MED ORDER — ENOXAPARIN SODIUM 30 MG/0.3ML ~~LOC~~ SOLN
30.0000 mg | Freq: Once | SUBCUTANEOUS | Status: AC
  Administered 2011-11-28: 30 mg via INTRAVENOUS
  Filled 2011-11-28: qty 0.3

## 2011-11-28 MED ORDER — LEVOTHYROXINE SODIUM 50 MCG PO TABS
250.0000 ug | ORAL_TABLET | Freq: Every day | ORAL | Status: DC
  Administered 2011-11-29: 250 ug via ORAL
  Filled 2011-11-28: qty 2

## 2011-11-28 MED ORDER — ZOLPIDEM TARTRATE 5 MG PO TABS
10.0000 mg | ORAL_TABLET | Freq: Every day | ORAL | Status: DC
  Administered 2011-11-28: 10 mg via ORAL
  Filled 2011-11-28: qty 2

## 2011-11-28 NOTE — Care Management Note (Signed)
    Page 1 of 1   11/29/2011     2:08:23 PM   CARE MANAGEMENT NOTE 11/29/2011  Patient:  Andrew Love, Andrew Love   Account Number:  [id number]  Date Initiated:  11/28/2011  Documentation initiated by:  Rosemary Holms  Subjective/Objective Assessment:   Pt adm with chest pain. Cardiology following. From home with spouse.     Action/Plan:   CM to follow   Anticipated DC Date:  11/29/2011   Anticipated DC Plan:  HOME/SELF CARE      DC Planning Services  CM consult      Choice offered to / List presented to:             Status of service:  Completed, signed off Medicare Important Message given?   (If response is "NO", the following Medicare IM given date fields will be blank) Date Medicare IM given:   Date Additional Medicare IM given:    Discharge Disposition:  HOME/SELF CARE  Per UR Regulation:    If discussed at Long Length of Stay Meetings, dates discussed:    Comments:  11/28/11 1300 Oluwaseyi Raffel Leanord Hawking RN BSN CM

## 2011-11-28 NOTE — Consult Note (Signed)
CARDIOLOGY CONSULT NOTE  Patient ID: Andrew Love MRN: [id number] DOB/AGE: August 21, 1965 46 y.o.  Admit date: 11/27/2011 Referring Physician: PTH Primary PhysicianFUSCO,LAWRENCE J., MD Primary Cardiologist: Dietrich Pates (Formerly Laurena Slimmer wants to change to [employer]) Reason for Consultation: Chest pain with known CAD Principal Problem:  *Chest pain Active Problems:  HEPATITIS  High blood pressure  CAD (coronary artery disease)  HPI: Andrew Love is a pleasant 46 year old obese Caucasian male with known history of coronary artery disease. [facility] but has not seen them since 2006. He wishes to transfer care to look [facility] as he did not follow them regularly. The patient had a cardiac catheterization in 2006 demonstrating single-vessel disease with a 30% blockage in the mid segment of the right coronary artery. LAD left main and circumflex were free of disease. He was thought to be having coronary spasms vs. microvascular ischemia. Flow improved with intravenous nitroglycerin.     The patient had been doing well over the last 4 years, but last evening while driving he experienced chest pressure on the left side described as burning and sharp radiating down his left arm with numbness and tingling. He had associated shortness of breath and nausea and vomiting. The pain waxed and waned, but did become worse prompting his admission through ER. He was found to be hypokalemic with potassium 3.4, blood pressures 136/76 with a heart rate of 93. He was given sublingual nitroglycerin, Zofran, and aspirin. He was also given one dose of morphine IV. Pain subsided but has begun to come back and a crescendo/decrescendo pattern. Initial cardiac enzymes were negative. Initial EKG showed abnormality inferior and anterior. However this a.m. EKG reveals normal sinus rhythm. He is resting comfortably although he does state the pain is at a "2" on the pain scale.  He remains on Lovenox and nitroglycerin prn.     Other history includes hypertension hypothyroidism secondary to thyroidectomy, obstructive sleep apnea, hypercholesterolemia, diverticular disease, and arthritis. He unfortunately continues to smoke approximately one pack a day. Review of systems complete and found to be negative unless listed above   Past Medical History  Diagnosis Date  . CAD (coronary artery disease)   . Elevated liver enzymes   . Diverticulitis     Hx of; requiring 3 admissions  . Sigmoid colon ulcer     Rectal polyps  . Thyroid disease   . Psoriasis     Per medical history form dated 06/13/10.  . Chronic insomnia     Per medical history form dated 06/13/10.  . Colitis     Per medical history from dated 06/13/10.  Marland Kitchen Hypertension   . Hypothyroidism   . Bronchitis     history of  . Back pain     chronic  . Arthritis   . 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 Cancer Mother     breast cancer - per medical history form dated 06/13/10.  . Diverticulitis Mother   . Hypertension Mother   . Cancer Father     skin - per medical history form dated 06/13/10.  Marland Kitchen Hypertension Father   . Anesthesia problems Neg Hx   . Hypotension Neg Hx   . Malignant hyperthermia Neg Hx   . Pseudochol deficiency Neg Hx     History   Social History  . Marital Status: Married    Spouse Name: N/A  Number of Children: N/A  . Years of Education: N/A   Occupational History  . 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 Smoking status: Current Everyday Smoker -- 0.2 packs/day for 18 years  . Smokeless tobacco: Not on file   Comment: Has been wearing patch for 20 days  . Alcohol Use: Yes     Occasional  . Drug Use: No  . Sexually Active:    Other Topics Concern  . 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 Colonoscopy 07/2008    Colitis,NSAID v. Ischemia,malrotation of the gut,Diverticulosis(L),hyperplastic  . Cardiac catheterization 2009  . Appendectomy   . Shoulder surgery     Left  . Rotator cuff repair     Left - per medical history form dated 06/13/10.  . Cardiac catheterization     Per medical history from dated 06/13/10.  . Thyroidectomy     Per medical history form dated 06/13/10.  . Colon surgery 2012  . Total hip arthroplasty 07/04/2011    Procedure: TOTAL HIP ARTHROPLASTY;  Surgeon: Eulas Post, MD;  Location: [facility] OR;  Service: Orthopedics;  Laterality: Right;    Prescriptions prior to admission  Medication Sig Dispense Refill  . Calcium Carbonate-Vitamin D (CALCIUM 600 + D PO) Take 1 tablet by mouth 2 (two) times daily.      Marland Kitchen levothyroxine (SYNTHROID, LEVOTHROID) 200 MCG tablet Take 200 mcg by mouth daily. Take with 50 mcg to equal 250 mcg daily.      Marland Kitchen levothyroxine (SYNTHROID, LEVOTHROID) 50 MCG tablet Take 50 mcg by mouth daily. Take with 200 mcg tablet to equal 250 mcg daily.      Marland Kitchen olmesartan (BENICAR) 20 MG tablet Take 20 mg by mouth daily.        . potassium chloride SA (K-DUR,KLOR-CON) 20 MEQ tablet Take 20 mEq by mouth daily.       . promethazine (PHENERGAN) 25 MG tablet Take 25 mg by mouth every 8 (eight) hours as needed. As needed for nausea.      Marland Kitchen zolpidem (AMBIEN) 10 MG tablet Take 10 mg by mouth at bedtime.        Cardiac Catheterization: ANGIOGRAPHIC DATA:  Left ventricle:  Left ventricular systolic function   was normal with ejection fraction of 55-60%.  There was no significant   mitral regurgitation.  There was no wall motion abnormality.   Right coronary artery:  Right coronary artery is a large-caliber vessel   and dominant vessel.  The mid segment has a 30% stenosis.  There was   SLOW filling noted which improved with intracoronary nitroglycerin.   Left main coronary artery:  The left main coronary artery is a large-   caliber vessel  that is smooth and normal.   Circumflex coronary artery:  The circumflex coronary artery is large-   caliber vessel and is codominant with right coronary artery, and it is   smooth and normal.   Ramus intermediate:  The ramus intermediate is a very large-caliber   vessel.  It is smooth and normal.   Left LAD:  LAD is a large-caliber vessel in the proximal segment, gives   origin to large diagonal 1 and large diagonal 2 and becomes small and   ends just before reaching the apex.    IMPRESSION:  Slow filling noted in the coronary arteries suggestive  of   microvascular spasm and/or microvascular disease.  The flow clearly   improved with intracoronary nitroglycerin administration.      RECOMMENDATIONS:  The patient has metabolic syndrome, i.e., syndrome X.   He needs aggressive risk modification.  He will be discharged home today   with Coreg 3.125 mg p.o. b.i.d., Simcor 500/20 one p.o. nightly 30   minutes after taking 325 mg of aspirin, and he will be continued on   Synthroid 0.2 mg p.o. daily and Benicar 40 mg p.o. daily.  If he   continues to have chest pain, consideration can be given for changing   him over to a calcium channel blocker and/or addition of a nitrate.   Also consideration can be given for addition of Ranexa.  Physical Exam: Blood pressure 115/77, pulse 53, temperature 97.7 F (36.5 C), temperature source Oral, resp. rate 16, height 6\' 1"  (1.854 m), weight 237 lb 3.4 oz (107.6 kg), SpO2 95.00%. General: Well developed, well nourished, in no acute distress, obese Head: Eyes PERRLA, No xanthomas.   Normal cephalic and atramatic  Lungs: Clear bilaterally to auscultation and percussion. Heart: HRRR S1 S2, without MRG.  Pulses are 2+ & equal.            No carotid bruit. No JVD.  No abdominal bruits. No femoral bruits. Abdomen: Bowel sounds are positive, abdomen soft and non-tender without masses or                  Hernia's noted. Msk:  Back normal, normal gait. Normal  strength and tone for age. Extremities: No clubbing, cyanosis or edema.  DP +1 Neuro: Alert and oriented X 3. Psych:  Good affect, responds appropriately  Labs: Lab Results  Component Value Date   WBC 12.1* 11/27/2011   HGB 16.0 11/28/2011   HCT 47.0 11/28/2011   MCV 91.4 11/27/2011   PLT 245 11/27/2011     Lab 11/28/11 0038 11/28/11 0023  NA 141 --  K 3.4* --  CL 107 --  CO2 -- 25  BUN 10 --  CREATININE 1.10 --  CALCIUM -- 9.8  PROT -- 7.4  BILITOT -- 0.4  ALKPHOS -- 105  ALT -- 32  AST -- 22  GLUCOSE 101* --   Lab Results  Component Value Date   CKTOTAL 161 11/28/2011   CKMB 1.6 11/28/2011   TROPONINI <0.30 11/28/2011    Lab Results  Component Value Date   CHOL  Value: 199        ATP III CLASSIFICATION:  <200     mg/dL   Desirable  409-811  mg/dL   Borderline High  >=914    mg/dL   High 78/06/9560   Lab Results  Component Value Date   HDL 23* 02/08/2008   Lab Results  Component Value Date   LDLCALC  Value: 126        Total Cholesterol/HDL:CHD Risk Coronary Heart Disease Risk Table                     Men   Women  1/2 Average Risk   3.4   3.3* 02/08/2008   Lab Results  Component Value Date   TRIG 248* 02/08/2008   Lab Results  Component Value Date   CHOLHDL 8.7 02/08/2008    Radiology: Dg Chest Portable 1 View  11/28/2011  *RADIOLOGY REPORT*  Clinical Data: Chest pain, shortness of breath, nausea and pain radiating into the jaw and left arm.  CHEST -  1 VIEW  Comparison:  06/28/2011  Findings: The heart size and mediastinal contours are within normal limits.  Both lungs are clear.  IMPRESSION: No active disease.  Original Report Authenticated By: Reola Calkins, M.D.   EKG:NSR rate of 60 bpm  ASSESSMENT AND PLAN:   1. Chest Pain: He is known history of non-obstructive coronary artery disease found on cardiac catheterization in 2006. He was also thought to have probable microvascular disease.  He initially had been watching his weight and eating a low cholesterol  diet and had stopped smoking, but has reverted back to old habits over the last year or so. Cardiac enzymes are found be negative; EKG initially was abnormal with anterior lateral Q waves, and T wave abnormality. but these are somewhat less prominent on subsequent tracings. He states he continues to have recurrent discomfort in his chest although not as severe.   I have discussed with him repeating his stress Myoview, and consideration of changing medications. He is requesting cardiac catheterization for definitive evaluation of coronary anatomy. I did explain that the stress Myoview is 85% accurate. He requests transfer to [facility] for cardiac catheterization and to be followed [facility]. I will discuss this with Dr. Dietrich Pates and plan transfer if he is in agreement. Continue aspirin and Avapro. We will follow serial cardiac enzymes.  2. Hypertension: Blood pressure is currently controlled on ARB. Can change to calcium channel blocker as recommended in the past due to his recurrent chest pain which could be related to coronary spasms. Also addition of Imdur vs. Ranexa can be considered.   3. Hypokalemia: We will replete.  Bettey Mare. Lyman Bishop NP [facility] 11/28/2011, 9:38 AM  Cardiology Attending Patient interviewed and examined. Discussed with Joni Reining, NP.  Above note annotated and modified based upon my findings.  Single episode of recurrent chest discomfort with ischemic quality, but without acute EKG abnormalities or elevation in cardiac markers. While there is certainly a possibility of progression of coronary disease to critical stenosis over the past 7 years, this could be another episode attributable to microvascular dysfunction. We will proceed with a stress echocardiogram in the morning. If negative, medical therapy can safely continue.  Amlodipine will be added to his medical therapy for treatment of possible microvascular disease. Patient is strongly encouraged to once  again discontinue cigarette smoking. He has abstained for periods of 7 years and 2 years in the past.  A lipid profile will be obtained and a decision made regarding lipid lowering therapy.  [place] Bing, MD 11/28/2011, 6:40 PM

## 2011-11-28 NOTE — ED Notes (Signed)
Patient complaining of chest pain that has came back. Rates pain 7/10. Patient given prn nitro sl.

## 2011-11-28 NOTE — H&P (Signed)
PCP:   Cassell Smiles., MD   Chief Complaint:  cp  HPI: 46 yo male with cp since earlier tonight with radiation to left arm and jaw.  Had similar episode about 8 years ago and had a cath at that time reports had 30% blockage, no stents placed at that time.  Associated sob and nausea no vomiting.  No le edema.  No f/u since cath 8 years ago.  Does not take asa.  Review of Systems:  O/w neg  Past Medical History: Past Medical History  Diagnosis Date  . CAD (coronary artery disease)   . Elevated liver enzymes   . Diverticulitis     Hx of; requiring 3 admissions  . Sigmoid colon ulcer     Rectal polyps  . Thyroid disease   . Psoriasis     Per medical history form dated 06/13/10.  . Chronic insomnia     Per medical history form dated 06/13/10.  . Colitis     Per medical history from dated 06/13/10.  Marland Kitchen Hypertension   . Hypothyroidism   . Bronchitis     history of  . Back pain     chronic  . Arthritis   . 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 Colonoscopy 07/2008    Colitis,NSAID v. Ischemia,malrotation of the gut,Diverticulosis(L),hyperplastic  . Cardiac catheterization 2009  . Appendectomy   . Shoulder surgery     Left  . Rotator cuff repair     Left - per medical history form dated 06/13/10.  . Cardiac catheterization     Per medical history from dated 06/13/10.  . Thyroidectomy     Per medical history form dated 06/13/10.  . Colon surgery 2012  . Total hip arthroplasty 07/04/2011    Procedure: TOTAL HIP ARTHROPLASTY;  Surgeon: Eulas Post, MD;  Location: [facility] OR;  Service: Orthopedics;  Laterality: Right;    Medications: Prior to Admission medications   Medication Sig Start Date End Date Taking? Authorizing Provider  Calcium Carbonate-Vitamin D (CALCIUM 600 + D PO) Take 1 tablet by mouth 2 (two) times daily.   Yes Historical Provider, MD  levothyroxine (SYNTHROID, LEVOTHROID) 200 MCG tablet Take 200 mcg  by mouth daily. Take with 50 mcg to equal 250 mcg daily.   Yes Historical Provider, MD  levothyroxine (SYNTHROID, LEVOTHROID) 50 MCG tablet Take 50 mcg by mouth daily. Take with 200 mcg tablet to equal 250 mcg daily.   Yes Historical Provider, MD  olmesartan (BENICAR) 20 MG tablet Take 20 mg by mouth daily.     Yes Historical Provider, MD  potassium chloride SA (K-DUR,KLOR-CON) 20 MEQ tablet Take 20 mEq by mouth daily.    Yes Historical Provider, MD  promethazine (PHENERGAN) 25 MG tablet Take 25 mg by mouth every 8 (eight) hours as needed. As needed for nausea.   Yes Historical Provider, MD  zolpidem (AMBIEN) 10 MG tablet Take 10 mg by mouth at bedtime.    Yes Historical Provider, MD    Allergies:  No Known Allergies  Social History:  reports that he has been smoking.  He does not have any smokeless tobacco history on file. He reports that he drinks alcohol. He reports that he does not use illicit drugs.  Family History: Family History  Problem Relation Age of Onset  . Cancer Mother     breast cancer - per medical history form dated 06/13/10.  . Diverticulitis Mother   .  Hypertension Mother   . Cancer Father     skin - per medical history form dated 06/13/10.  Marland Kitchen Hypertension Father   . Anesthesia problems Neg Hx   . Hypotension Neg Hx   . Malignant hyperthermia Neg Hx   . Pseudochol deficiency Neg Hx     Physical Exam: Filed Vitals:   11/28/11 0200 11/28/11 0300 11/28/11 0336 11/28/11 0351  BP: 105/57 97/59 100/64 99/59  Pulse: 72 60 55 60  Temp:   97.7 F (36.5 C)   TempSrc:   Oral   Resp: 17 15 18    Height:      Weight:      SpO2: 97% 97% 100% 98%   General appearance: alert, cooperative and no distress Lungs: clear to auscultation bilaterally Heart: regular rate and rhythm, S1, S2 normal, no murmur, click, rub or gallop Abdomen: soft, non-tender; bowel sounds normal; no masses,  no organomegaly Extremities: extremities normal, atraumatic, no cyanosis or edema Pulses: 2+  and symmetric Skin: Skin color, texture, turgor normal. No rashes or lesions Neurologic: Grossly normal    Labs on Admission:   [facility] 11/28/11 0038 11/28/11 0023  NA 141 138  K 3.4* 3.4*  CL 107 103  CO2 -- 25  GLUCOSE 101* 104*  BUN 10 11  CREATININE 1.10 0.99  CALCIUM -- 9.8  MG -- --  PHOS -- --    Basename 11/28/11 0023  AST 22  ALT 32  ALKPHOS 105  BILITOT 0.4  PROT 7.4  ALBUMIN 4.1    Basename 11/28/11 0038 11/27/11 2346  WBC -- 12.1*  NEUTROABS -- --  HGB 16.0 16.4  HCT 47.0 45.5  MCV -- 91.4  PLT -- 245   Radiological Exams on Admission: Dg Chest Portable 1 View  11/28/2011  *RADIOLOGY REPORT*  Clinical Data: Chest pain, shortness of breath, nausea and pain radiating into the jaw and left arm.  CHEST - 1 VIEW  Comparison:  06/28/2011  Findings: The heart size and mediastinal contours are within normal limits.  Both lungs are clear.  IMPRESSION: No active disease.  Original Report Authenticated By: Reola Calkins, M.D.    Assessment/Plan Present on Admission:  47 yo male with [facility]/cp .Chest pain .HEPATITIS .High blood pressure .CAD (coronary artery disease)  Romi, tele, echo.  Cards consult for further w/u recommendations.  Asa.  Mariama Saintvil [phone] 11/28/2011, 4:33 AM

## 2011-11-28 NOTE — Progress Notes (Signed)
UR Chart Review Completed  

## 2011-11-28 NOTE — Progress Notes (Signed)
Dr. Kerry Hough notified earlier of patients HR in the 40's. Instructed to continue to monitor unless patient becomes symptomatic. Patient has no complaints at this time.

## 2011-11-28 NOTE — Progress Notes (Signed)
Patient admitted overnight by Dr. Onalee Hua for chest pain.  Patient seen and examined, database reviewed.  He reports intermittent chest discomfort overnight. He also has shortness of breath on exertion. He has had a cardiac catheterization in the past which has shown nonobstructive coronary disease. Cardiac enzymes thus far been negative. Due to concern for unstable angina he was put on Lovenox as well as when necessary nitroglycerin. Cardiology has seen the patient. Further workup including stress test versus cardiac catheterization will be deferred to cardiology.

## 2011-11-28 NOTE — Progress Notes (Signed)
ANTICOAGULATION CONSULT NOTE - Initial Consult  Pharmacy Consult for Lovenox Indication: STEMI / ACS  No Known Allergies  Patient Measurements: Height: 6\' 1"  (185.4 cm) Weight: 237 lb 3.4 oz (107.6 kg) IBW/kg (Calculated) : 79.9  Heparin Dosing Weight: 107 kg  Vital Signs: Temp: 97.5 F (36.4 C) (07/23 1421) Temp src: Oral (07/23 1421) BP: 96/61 mmHg (07/23 1421) Pulse Rate: 52  (07/23 1424)  Labs:  Basename 11/28/11 1125 11/28/11 0515 11/28/11 0038 11/28/11 0023 11/27/11 2346  HGB -- -- 16.0 -- 16.4  HCT -- -- 47.0 -- 45.5  PLT -- -- -- -- 245  APTT -- -- -- -- --  LABPROT -- -- -- -- --  INR -- -- -- -- --  HEPARINUNFRC -- -- -- -- --  CREATININE -- -- 1.10 0.99 --  CKTOTAL 139 161 -- -- --  CKMB 1.6 1.6 -- -- --  TROPONINI <0.30 <0.30 -- -- --    Estimated Creatinine Clearance: 109.2 ml/min (by C-G formula based on Cr of 1.1).   Medical History: Past Medical History  Diagnosis Date  . CAD (coronary artery disease)   . Elevated liver enzymes   . Diverticulitis     Hx of; requiring 3 admissions  . Sigmoid colon ulcer     Rectal polyps  . Thyroid disease   . Psoriasis     Per medical history form dated 06/13/10.  . Chronic insomnia     Per medical history form dated 06/13/10.  . Colitis     Per medical history from dated 06/13/10.  Marland Kitchen Hypertension   . Hypothyroidism   . Bronchitis     history of  . Back pain     chronic  . Arthritis   . 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 aspirin  324 mg Oral Once  . aspirin  324 mg Oral NOW   Or  . aspirin  300 mg Rectal NOW  . aspirin EC  81 mg Oral Daily  . enoxaparin (LOVENOX) injection  30 mg Intravenous Once   And  . enoxaparin (LOVENOX) injection  100 mg Subcutaneous Once  . enoxaparin (LOVENOX) injection  100 mg Subcutaneous Q12H  . irbesartan  75 mg Oral Daily  . levothyroxine  250 mcg Oral QAC breakfast  .  morphine injection  4 mg Intravenous  Once  . ondansetron  4 mg Intravenous Once  . potassium chloride SA  20 mEq Oral Daily  . sodium chloride  3 mL Intravenous Q12H  . zolpidem  10 mg Oral QHS  . DISCONTD: levothyroxine  200 mcg Oral QAC breakfast  . DISCONTD: levothyroxine  50 mcg Oral QAC breakfast   Infusions:   PRN: sodium chloride, acetaminophen, nitroGLYCERIN, ondansetron (ZOFRAN) IV, sodium chloride, DISCONTD: nitroGLYCERIN Anti-infectives    None      Assessment:    ACS Lovenox coverage in 42yr male with normal renal function and past hx of CAD  Goal of Therapy:  Anti-Xa level 0.6-1.2 units/ml 4hrs after LMWH dose given Monitor platelets by anticoagulation protocol: Yes   Plan:  Lovenox 100mg  SQ injection q12h initial dose.   CBC q72hrs while on Lovenox Monitor renal function.   Elson Clan 11/28/2011,2:28 PM

## 2011-11-28 NOTE — Progress Notes (Signed)
ANTICOAGULATION CONSULT NOTE - Initial Consult  Pharmacy Consult for Lovenox Indication: STEMI / ACS  No Known Allergies  Patient Measurements: Height: 6\' 1"  (185.4 cm) Weight: 237 lb 3.4 oz (107.6 kg) IBW/kg (Calculated) : 79.9  Heparin Dosing Weight: 107 kg  Vital Signs: Temp: 97.7 F (36.5 C) (07/23 0446) Temp src: Oral (07/23 0446) BP: 115/77 mmHg (07/23 0446) Pulse Rate: 53  (07/23 0446)  Labs:  Basename 11/28/11 0038 11/28/11 0023 11/27/11 2346  HGB 16.0 -- 16.4  HCT 47.0 -- 45.5  PLT -- -- 245  APTT -- -- --  LABPROT -- -- --  INR -- -- --  HEPARINUNFRC -- -- --  CREATININE 1.10 0.99 --  CKTOTAL -- -- --  CKMB -- -- --  TROPONINI -- -- --    Estimated Creatinine Clearance: 109.2 ml/min (by C-G formula based on Cr of 1.1).   Medical History: Past Medical History  Diagnosis Date  . CAD (coronary artery disease)   . Elevated liver enzymes   . Diverticulitis     Hx of; requiring 3 admissions  . Sigmoid colon ulcer     Rectal polyps  . Thyroid disease   . Psoriasis     Per medical history form dated 06/13/10.  . Chronic insomnia     Per medical history form dated 06/13/10.  . Colitis     Per medical history from dated 06/13/10.  Marland Kitchen Hypertension   . Hypothyroidism   . Bronchitis     history of  . Back pain     chronic  . Arthritis   . 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 aspirin  324 mg Oral Once  . aspirin  324 mg Oral NOW   Or  . aspirin  300 mg Rectal NOW  . aspirin EC  81 mg Oral Daily  . irbesartan  75 mg Oral Daily  . levothyroxine  200 mcg Oral QAC breakfast  . levothyroxine  50 mcg Oral QAC breakfast  .  morphine injection  4 mg Intravenous Once  . ondansetron  4 mg Intravenous Once  . potassium chloride SA  20 mEq Oral Daily  . sodium chloride  3 mL Intravenous Q12H  . zolpidem  10 mg Oral QHS   Infusions:   PRN: sodium chloride, acetaminophen, nitroGLYCERIN, ondansetron  (ZOFRAN) IV, sodium chloride, DISCONTD: nitroGLYCERIN Anti-infectives    None      Assessment:    ACA / STEMI Lovenox coverage in 78yr male with normal renal function and past hx of CAD  Goal of Therapy:  Anti-Xa level 0.6-1.2 units/ml 4hrs after LMWH dose given Monitor platelets by anticoagulation protocol: Yes   Plan:  Lovenox 30mg  IV bolus with 100mg  SQ injection q12h initial dose.  Day shift clinical pharmacist to follow later this morning.  Scarlett Presto 11/28/2011,6:00 AM

## 2011-11-29 ENCOUNTER — Observation Stay (HOSPITAL_COMMUNITY): Payer: 59

## 2011-11-29 ENCOUNTER — Encounter (HOSPITAL_COMMUNITY): Payer: Self-pay | Admitting: Cardiology

## 2011-11-29 DIAGNOSIS — K759 Inflammatory liver disease, unspecified: Secondary | ICD-10-CM

## 2011-11-29 DIAGNOSIS — R072 Precordial pain: Secondary | ICD-10-CM

## 2011-11-29 DIAGNOSIS — E079 Disorder of thyroid, unspecified: Secondary | ICD-10-CM

## 2011-11-29 LAB — BASIC METABOLIC PANEL
BUN: 12 mg/dL (ref 6–23)
CO2: 26 mEq/L (ref 19–32)
Calcium: 9.2 mg/dL (ref 8.4–10.5)
Chloride: 103 mEq/L (ref 96–112)
Creatinine, Ser: 1.03 mg/dL (ref 0.50–1.35)
GFR calc Af Amer: >90 mL/min (ref >90)
GFR calc non Af Amer: 86 mL/min — ABNORMAL LOW (ref >90)
Glucose, Bld: 100 mg/dL — ABNORMAL HIGH (ref 70–99)
Potassium: 3.7 mEq/L (ref 3.5–5.1)
Sodium: 139 mEq/L (ref 135–145)

## 2011-11-29 LAB — CBC
HCT: 43.6 % (ref 39.0–52.0)
Hemoglobin: 15.3 g/dL (ref 13.0–17.0)
MCH: 32.7 pg (ref 26.0–34.0)
MCHC: 35.1 g/dL (ref 30.0–36.0)
MCV: 93.2 fL (ref 78.0–100.0)
Platelets: 204 10*3/uL (ref 150–400)
RBC: 4.68 MIL/uL (ref 4.22–5.81)
RDW: 12.7 % (ref 11.5–15.5)
WBC: 8.5 10*3/uL (ref 4.0–10.5)

## 2011-11-29 MED ORDER — NITROGLYCERIN 0.4 MG SL SUBL: 0.4000 mg | SUBLINGUAL_TABLET | SUBLINGUAL | Status: DC | PRN

## 2011-11-29 MED ORDER — ENOXAPARIN SODIUM 60 MG/0.6ML ~~LOC~~ SOLN: 50.0000 mg | SUBCUTANEOUS | Status: DC

## 2011-11-29 MED ORDER — AMLODIPINE BESYLATE 5 MG PO TABS: 5.0000 mg | ORAL_TABLET | Freq: Every day | ORAL | Status: DC

## 2011-11-29 MED ORDER — ASPIRIN 81 MG PO TBEC: 81.0000 mg | DELAYED_RELEASE_TABLET | Freq: Every day | ORAL | Status: DC

## 2011-11-29 NOTE — Progress Notes (Signed)
[facility] PROGRESS NOTE  Andrew Love WUJ:[id number] DOB: Dec 05, 1965 DOA: 11/27/2011 PCP: Cassell Smiles., MD  Assessment/Plan: Principal Problem:  *Chest pain Active Problems:  HEPATITIS  High blood pressure  CAD (coronary artery disease)  1. Chest pain.  Cardiac enzymes have been negative.  Cardiology has been following.  Plans for stress echocardiogram today.  If this is negative, then he can likely discharge home.  Will follow up with cardiology 2. HTN. Stable 3. Hypothyroidism, on supplements  Code Status: full code Family Communication: discussed with patient Disposition Plan: discharge home pending cardiology evaluation   Brief narrative: This gentleman was admitted to the hospital with chest pain.  He has a history of nonobstructive coronary disease and had a cardiac cath done in the past.  Due to concerns for angina, he was admitted to the hospital.  His enzymes were found to be negative.  Cardiology has been following and pursuing further work up.  Consultants:  [facility]  Procedures:  none  Antibiotics:  none  HPI/Subjective: No further chest pain or shortness of breath  Objective: Filed Vitals:   11/28/11 1424 11/28/11 2014 11/28/11 2156 11/29/11 0926  BP:  103/65 107/68   Pulse: 52  54 59  Temp:   98.4 F (36.9 C)   TempSrc:      Resp:   20   Height:      Weight:      SpO2:   94% 93%    Intake/Output Summary (Last 24 hours) at 11/29/11 1053 Last data filed at 11/28/11 1801  Gross per 24 hour  Intake    480 ml  Output      0 ml  Net    480 ml    Exam:   General:  NAD  Cardiovascular: s1, s2, rrr  Respiratory: cta b  Abdomen: soft, nt, bs+  Data Reviewed: Basic Metabolic Panel:  Lab 11/29/11 9562 11/28/11 0038 11/28/11 0023  NA 139 141 138  K 3.7 3.4* 3.4*  CL 103 107 103  CO2 26 -- 25  GLUCOSE 100* 101* 104*  BUN 12 10 11   CREATININE 1.03 1.10 0.99  CALCIUM 9.2 -- 9.8  MG -- -- --  PHOS -- -- --    Liver Function Tests:  Lab 11/28/11 0023  AST 22  ALT 32  ALKPHOS 105  BILITOT 0.4  PROT 7.4  ALBUMIN 4.1   No results found for this basename: LIPASE:5,AMYLASE:5 in the last 168 hours No results found for this basename: AMMONIA:5 in the last 168 hours CBC:  Lab 11/29/11 0536 11/28/11 0038 11/27/11 2346  WBC 8.5 -- 12.1*  NEUTROABS -- -- --  HGB 15.3 16.0 16.4  HCT 43.6 47.0 45.5  MCV 93.2 -- 91.4  PLT 204 -- 245   Cardiac Enzymes:  Lab 11/28/11 1729 11/28/11 1125 11/28/11 0515  CKTOTAL 121 139 161  CKMB 1.5 1.6 1.6  CKMBINDEX -- -- --  TROPONINI <0.30 <0.30 <0.30   BNP (last 3 results) No results found for this basename: PROBNP:3 in the last 8760 hours CBG: No results found for this basename: GLUCAP:5 in the last 168 hours  No results found for this or any previous visit (from the past 240 hour(s)).   Studies: Dg Chest Portable 1 View  11/28/2011  *RADIOLOGY REPORT*  Clinical Data: Chest pain, shortness of breath, nausea and pain radiating into the jaw and left arm.  CHEST - 1 VIEW  Comparison:  06/28/2011  Findings: The heart size and mediastinal contours  are within normal limits.  Both lungs are clear.  IMPRESSION: No active disease.  Original Report Authenticated By: Reola Calkins, M.D.    Scheduled Meds:   . amLODipine  5 mg Oral Daily  . aspirin  324 mg Oral Once  . aspirin EC  81 mg Oral Daily  . enoxaparin (LOVENOX) injection  50 mg Subcutaneous Q24H  . irbesartan  75 mg Oral Daily  . levothyroxine  250 mcg Oral QAC breakfast  . potassium chloride SA  20 mEq Oral Daily  . sodium chloride  3 mL Intravenous Q12H  . zolpidem  10 mg Oral QHS  . DISCONTD: enoxaparin (LOVENOX) injection  100 mg Subcutaneous Q12H  . DISCONTD: enoxaparin (LOVENOX) injection  40 mg Subcutaneous Q24H   Continuous Infusions:   Principal Problem:  *Chest pain Active Problems:  HEPATITIS  High blood pressure  CAD (coronary artery disease)    Time spent:     [facility]  [facility] Pager [phone]. If 7PM-7AM, please contact night-coverage at [email or web address], password [facility] 11/29/2011, 10:53 AM  LOS: 2 days

## 2011-11-29 NOTE — Progress Notes (Addendum)
SUBJECTIVE: No further complaints of chest pain. Wants to go home.  Basename 11/29/11 0536 11/28/11 0038 11/28/11 0023  NA 139 141 --  K 3.7 3.4* --  CL 103 107 --  CO2 26 -- 25  GLUCOSE 100* 101* --  BUN 12 10 --  CREATININE 1.03 1.10 --  CALCIUM 9.2 -- 9.8  MG -- -- --  PHOS -- -- --   Liver Function Tests:  Basename 11/28/11 0023  AST 22  ALT 32  ALKPHOS 105  BILITOT 0.4  PROT 7.4  ALBUMIN 4.1   CBC:  Basename 11/29/11 0536 11/28/11 0038 11/27/11 2346  WBC 8.5 -- 12.1*  NEUTROABS -- -- --  HGB 15.3 16.0 --  HCT 43.6 47.0 --  MCV 93.2 -- 91.4  PLT 204 -- 245   Cardiac Enzymes:  Basename 11/28/11 1729 11/28/11 1125 11/28/11 0515  CKTOTAL 121 139 161  CKMB 1.5 1.6 1.6  CKMBINDEX -- -- --  TROPONINI <0.30 <0.30 <0.30   D-Dimer:  Basename 11/28/11 0023  DDIMER 0.23   Fasting Lipid Panel:  Basename 11/28/11 1928  CHOL 190  HDL 24*  LDLCALC UNABLE TO CALCULATE IF TRIGLYCERIDE OVER 400 mg/dL  TRIG 784*  CHOLHDL 7.9  LDLDIRECT --   RADIOLOGY: Dg Chest Portable 1 View  11/28/2011  *RADIOLOGY REPORT*  Clinical Data: Chest pain, shortness of breath, nausea and pain radiating into the jaw and left arm.  CHEST - 1 VIEW  Comparison:  06/28/2011  Findings: The heart size and mediastinal contours are within normal limits.  Both lungs are clear.  IMPRESSION: No active disease.  Original Report Authenticated By: Reola Calkins, M.D.   PHYSICAL EXAM BP 107/68  Pulse 54  Temp 98.4 F (36.9 C) (Oral)  Resp 20  Ht 6\' 1"  (1.854 m)  Wt 237 lb 3.4 oz (107.6 kg)  BMI 31.30 kg/m2  SpO2 94% General: Well developed, well nourished, in no acute distress Head: Eyes PERRLA, No xanthomas.   Normal cephalic and atramatic  Lungs: Clear bilaterally to auscultation and percussion. Heart: HRRR S1 S2, No MRG .  Pulses are 2+ & equal.            No carotid bruit. No JVD.  No abdominal bruits. No femoral bruits. Abdomen: Bowel sounds are positive, abdomen soft and non-tender  without masses or                  Hernia's noted. Msk:  Back normal, normal gait. Normal strength and tone for age. Extremities: No clubbing, cyanosis or edema.  DP +1 Neuro: Alert and oriented X 3. Psych:  Good affect, responds appropriately  TELEMETRY: Reviewed telemetry pt in:[facility]  ASSESSMENT AND PLAN: 1. Chest Pain: Known history of CAD. Negative cardiac enzymes. Plans for stress echo this am.  Probably go home if negative  Bettey Mare. Lyman Bishop NP [facility] 11/29/2011, 9:24 AM  Cardiology Attending Patient interviewed and examined. Discussed with Joni Reining, NP.  Above note annotated and modified based upon my findings.  No further symptoms.  Stress test negative.  Echo reveals no significant abnormalities.  MI ruled out.  OK for discharge.  We will arrange continuing care for ASCVD in office.  Sacaton Bing, MD 11/29/2011, 5:40 PM

## 2011-11-29 NOTE — Progress Notes (Signed)
Pt with orders to be discharge to home. Discharge instructions given to patient and spouse, both verbalized understanding. Handouts given on chest pain/angina. Pt in stable condition upon discharge. Pt left via private vehicle with spouse.

## 2011-11-29 NOTE — Progress Notes (Signed)
Stress Lab Nurses Notes - [name]  Andrew Love 11/29/2011 Reason for doing test: CAD and Chest Pain Type of test: Stress Echo Nurse performing test: Parke Poisson, RN Nuclear Medicine Tech: Not Applicable Echo Tech: Bosie Clos MD performing test: R. Dietrich Pates Family MD: [place] Test explained and consent signed: yes IV started: No redness or edema and Saline lock from floor Symptoms: fatigue & discomfort in legs Treatment/Intervention: None Reason test stopped: leg discomfort After recovery IV was: saline lock in place Patient to return to Nuc. Med at :NA Patient discharged: Transported back to room 206 via WC Patient's Condition upon discharge was: stable Comments: During test peak BP 152/62 & HR 132.  Recovery BP 132/62 & HR 64.  Symptoms resolved in recovery. Erskine Speed T

## 2011-11-29 NOTE — Discharge Summary (Signed)
Physician Discharge Summary  TAEGEN DELKER Love:[id number] DOB: Oct 28, 1965 DOA: 11/27/2011  PCP: Cassell Smiles., MD  Admit date: 11/27/2011 Discharge date: 11/29/2011  Recommendations for Outpatient Follow-up:  1. Followup with primary care physician in one to 2 weeks 2. Followup with [facility], they will arrange the appointment  Discharge Diagnoses:  Principal Problem:  *Chest pain, atypical, MI ruled out Active Problems:  HEPATITIS  High blood pressure  CAD (coronary artery disease) Tobacco abuse  Discharge Condition: Improved  Diet recommendation: The heart healthy  History of present illness:  46 yo male with cp since earlier tonight with radiation to left arm and jaw. Had similar episode about 8 years ago and had a cath at that time reports had 30% blockage, no stents placed at that time. Associated sob and nausea no vomiting. No le edema. No f/u since cath 8 years ago. Does not take asa.   Hospital Course:  This gentleman was admitted to the hospital for further evaluation of chest pain. He is history of nonobstructive coronary disease. His enzymes were found to be negative. He did not have any acute EKG changes. Patient was seen by cardiology and underwent stress echocardiogram. This will be negative for acute findings. Myocardial infarction was effectively ruled out. He was felt safe to discharge home and have further followup with cardiology service as an outpatient.  Procedures:  Stress test on 7/24, negative  Consultations:  [facility]  Discharge Exam: Filed Vitals:   11/29/11 1413  BP: 113/75  Pulse: 57  Temp: 98 F (36.7 C)  Resp: 20   Filed Vitals:   11/28/11 2014 11/28/11 2156 11/29/11 0926 11/29/11 1413  BP: 103/65 107/68  113/75  Pulse:  54 59 57  Temp:  98.4 F (36.9 C)  98 F (36.7 C)  TempSrc:      Resp:  20  20  Height:      Weight:      SpO2:  94% 93% 95%   General: NAD  Cardiovascular: s1, s2 rrr Respiratory: CTA  B  Discharge Instructions  Discharge Orders    Future Orders Please Complete By Expires   Diet - low sodium heart healthy      Increase activity slowly      Call MD for:  severe uncontrolled pain      Call MD for:  difficulty breathing, headache or visual disturbances        Medication List  As of 11/29/2011  8:11 PM   TAKE these medications         AMBIEN 10 MG tablet   Generic drug: zolpidem   Take 10 mg by mouth at bedtime.      amLODipine 5 MG tablet   Commonly known as: NORVASC   Take 1 tablet (5 mg total) by mouth daily.      aspirin 81 MG EC tablet   Take 1 tablet (81 mg total) by mouth daily.      CALCIUM 600 + D PO   Take 1 tablet by mouth 2 (two) times daily.      levothyroxine 200 MCG tablet   Commonly known as: SYNTHROID, LEVOTHROID   Take 200 mcg by mouth daily. Take with 50 mcg to equal 250 mcg daily.      levothyroxine 50 MCG tablet   Commonly known as: SYNTHROID, LEVOTHROID   Take 50 mcg by mouth daily. Take with 200 mcg tablet to equal 250 mcg daily.      nitroGLYCERIN 0.4 MG SL  tablet   Commonly known as: NITROSTAT   Place 1 tablet (0.4 mg total) under the tongue every 5 (five) minutes x 3 doses as needed for chest pain.      olmesartan 20 MG tablet   Commonly known as: BENICAR   Take 20 mg by mouth daily.      potassium chloride SA 20 MEQ tablet   Commonly known as: K-DUR,KLOR-CON   Take 20 mEq by mouth daily.      promethazine 25 MG tablet   Commonly known as: PHENERGAN   Take 25 mg by mouth every 8 (eight) hours as needed. As needed for nausea.           Follow-up Information    Follow up with Cassell Smiles., MD. Schedule an appointment as soon as possible for a visit in 2 weeks.   Contact information:   [address] [place] [address] [phone]           The results of significant diagnostics from this hospitalization (including imaging, microbiology, ancillary and laboratory) are  listed below for reference.    Significant Diagnostic Studies: Dg Chest Portable 1 View  11/28/2011  *RADIOLOGY REPORT*  Clinical Data: Chest pain, shortness of breath, nausea and pain radiating into the jaw and left arm.  CHEST - 1 VIEW  Comparison:  06/28/2011  Findings: The heart size and mediastinal contours are within normal limits.  Both lungs are clear.  IMPRESSION: No active disease.  Original Report Authenticated By: Reola Calkins, M.D.    Microbiology: No results found for this or any previous visit (from the past 240 hour(s)).   Labs: Basic Metabolic Panel:  Lab 11/29/11 8295 11/28/11 0038 11/28/11 0023  NA 139 141 138  K 3.7 3.4* 3.4*  CL 103 107 103  CO2 26 -- 25  GLUCOSE 100* 101* 104*  BUN 12 10 11   CREATININE 1.03 1.10 0.99  CALCIUM 9.2 -- 9.8  MG -- -- --  PHOS -- -- --   Liver Function Tests:  Lab 11/28/11 0023  AST 22  ALT 32  ALKPHOS 105  BILITOT 0.4  PROT 7.4  ALBUMIN 4.1   No results found for this basename: LIPASE:5,AMYLASE:5 in the last 168 hours No results found for this basename: AMMONIA:5 in the last 168 hours CBC:  Lab 11/29/11 0536 11/28/11 0038 11/27/11 2346  WBC 8.5 -- 12.1*  NEUTROABS -- -- --  HGB 15.3 16.0 16.4  HCT 43.6 47.0 45.5  MCV 93.2 -- 91.4  PLT 204 -- 245   Cardiac Enzymes:  Lab 11/28/11 1729 11/28/11 1125 11/28/11 0515  CKTOTAL 121 139 161  CKMB 1.5 1.6 1.6  CKMBINDEX -- -- --  TROPONINI <0.30 <0.30 <0.30   BNP: BNP (last 3 results) No results found for this basename: PROBNP:3 in the last 8760 hours CBG: No results found for this basename: GLUCAP:5 in the last 168 hours  Time coordinating discharge:  Signed:  MEMON,JEHANZEB  [facility] 11/29/2011, 8:11 PM

## 2011-12-04 ENCOUNTER — Encounter: Payer: Self-pay | Admitting: *Deleted

## 2011-12-07 ENCOUNTER — Emergency Department (HOSPITAL_COMMUNITY): Payer: 59

## 2011-12-07 ENCOUNTER — Emergency Department (HOSPITAL_COMMUNITY)
Admission: EM | Admit: 2011-12-07 | Discharge: 2011-12-07 | Disposition: A | Discharge status: Home / Self Care | Payer: 59 | Attending: Emergency Medicine | Admitting: Emergency Medicine

## 2011-12-07 ENCOUNTER — Encounter (HOSPITAL_COMMUNITY): Payer: Self-pay | Admitting: *Deleted

## 2011-12-07 DIAGNOSIS — G4700 Insomnia, unspecified: Secondary | ICD-10-CM | POA: Insufficient documentation

## 2011-12-07 DIAGNOSIS — I10 Essential (primary) hypertension: Secondary | ICD-10-CM | POA: Insufficient documentation

## 2011-12-07 DIAGNOSIS — E039 Hypothyroidism, unspecified: Secondary | ICD-10-CM | POA: Insufficient documentation

## 2011-12-07 DIAGNOSIS — I2510 Atherosclerotic heart disease of native coronary artery without angina pectoris: Secondary | ICD-10-CM | POA: Insufficient documentation

## 2011-12-07 DIAGNOSIS — M129 Arthropathy, unspecified: Secondary | ICD-10-CM | POA: Insufficient documentation

## 2011-12-07 DIAGNOSIS — N390 Urinary tract infection, site not specified: Secondary | ICD-10-CM | POA: Insufficient documentation

## 2011-12-07 DIAGNOSIS — F17200 Nicotine dependence, unspecified, uncomplicated: Secondary | ICD-10-CM | POA: Insufficient documentation

## 2011-12-07 LAB — CBC WITH DIFFERENTIAL/PLATELET
Basophils Absolute: 0 10*3/uL (ref 0.0–0.1)
Basophils Relative: 0 % (ref 0–1)
Eosinophils Absolute: 0.1 10*3/uL (ref 0.0–0.7)
Eosinophils Relative: 0 % (ref 0–5)
HCT: 39.2 % (ref 39.0–52.0)
Hemoglobin: 13.9 g/dL (ref 13.0–17.0)
Lymphocytes Relative: 15 % (ref 12–46)
Lymphs Abs: 1.7 10*3/uL (ref 0.7–4.0)
MCH: 32.9 pg (ref 26.0–34.0)
MCHC: 35.5 g/dL (ref 30.0–36.0)
MCV: 92.7 fL (ref 78.0–100.0)
Monocytes Absolute: 1.4 10*3/uL — ABNORMAL HIGH (ref 0.1–1.0)
Monocytes Relative: 11 % (ref 3–12)
Neutro Abs: 8.8 10*3/uL — ABNORMAL HIGH (ref 1.7–7.7)
Neutrophils Relative %: 74 % (ref 43–77)
Platelets: 190 10*3/uL (ref 150–400)
RBC: 4.23 MIL/uL (ref 4.22–5.81)
RDW: 12.5 % (ref 11.5–15.5)
WBC: 12 10*3/uL — ABNORMAL HIGH (ref 4.0–10.5)

## 2011-12-07 LAB — URINALYSIS, ROUTINE W REFLEX MICROSCOPIC
Bilirubin Urine: NEGATIVE
Glucose, UA: NEGATIVE mg/dL
Ketones, ur: NEGATIVE mg/dL
Leukocytes, UA: NEGATIVE
Nitrite: NEGATIVE
Protein, ur: NEGATIVE mg/dL
Specific Gravity, Urine: 1.015 (ref 1.005–1.030)
Urobilinogen, UA: 0.2 mg/dL (ref 0.0–1.0)
pH: 8 (ref 5.0–8.0)

## 2011-12-07 LAB — URINE MICROSCOPIC-ADD ON

## 2011-12-07 LAB — COMPREHENSIVE METABOLIC PANEL
ALT: 25 U/L (ref 0–53)
AST: 17 U/L (ref 0–37)
Albumin: 3.5 g/dL (ref 3.5–5.2)
Alkaline Phosphatase: 89 U/L (ref 39–117)
BUN: 8 mg/dL (ref 6–23)
CO2: 20 mEq/L (ref 19–32)
Calcium: 8.5 mg/dL (ref 8.4–10.5)
Chloride: 103 mEq/L (ref 96–112)
Creatinine, Ser: 1.09 mg/dL (ref 0.50–1.35)
GFR calc Af Amer: >90 mL/min (ref >90)
GFR calc non Af Amer: 80 mL/min — ABNORMAL LOW (ref >90)
Glucose, Bld: 107 mg/dL — ABNORMAL HIGH (ref 70–99)
Potassium: 3.3 mEq/L — ABNORMAL LOW (ref 3.5–5.1)
Sodium: 135 mEq/L (ref 135–145)
Total Bilirubin: 0.7 mg/dL (ref 0.3–1.2)
Total Protein: 7 g/dL (ref 6.0–8.3)

## 2011-12-07 MED ORDER — ACETAMINOPHEN 325 MG PO TABS
650.0000 mg | ORAL_TABLET | Freq: Once | ORAL | Status: AC
  Administered 2011-12-07: 650 mg via ORAL
  Filled 2011-12-07: qty 2

## 2011-12-07 MED ORDER — HYDROMORPHONE HCL PF 1 MG/ML IJ SOLN
1.0000 mg | Freq: Once | INTRAMUSCULAR | Status: AC
  Administered 2011-12-07: 1 mg via INTRAVENOUS
  Filled 2011-12-07: qty 1

## 2011-12-07 MED ORDER — ONDANSETRON HCL 4 MG/2ML IJ SOLN
4.0000 mg | Freq: Once | INTRAMUSCULAR | Status: AC
  Administered 2011-12-07: 4 mg via INTRAVENOUS
  Filled 2011-12-07: qty 2

## 2011-12-07 MED ORDER — CEPHALEXIN 500 MG PO CAPS: 500.0000 mg | ORAL_CAPSULE | Freq: Four times a day (QID) | ORAL | Status: AC

## 2011-12-07 MED ORDER — SODIUM CHLORIDE 0.9 % IV BOLUS (SEPSIS)
1000.0000 mL | Freq: Once | INTRAVENOUS | Status: AC
  Administered 2011-12-07: 1000 mL via INTRAVENOUS

## 2011-12-07 MED ORDER — CEPHALEXIN 500 MG PO CAPS
500.0000 mg | ORAL_CAPSULE | Freq: Once | ORAL | Status: AC
  Administered 2011-12-07: 500 mg via ORAL
  Filled 2011-12-07: qty 1

## 2011-12-07 NOTE — ED Notes (Addendum)
Headache, n/v, chills, fever, dizzy.  cramping

## 2011-12-07 NOTE — ED Provider Notes (Cosign Needed)
History     CSN: [id number]  Arrival date & time 12/07/11  1900   First MD Initiated Contact with Patient 12/07/11 1937      Chief Complaint  Patient presents with  . Headache    (Consider location/radiation/quality/duration/timing/severity/associated sxs/prior treatment) Patient is a 46 y.o. male presenting with headaches. The history is provided by the patient (pt complains of fever and aches). No language interpreter was used.  Headache  This is a new problem. The current episode started 6 to 12 hours ago. The problem occurs constantly. The problem has not changed since onset.The headache is associated with nothing. Pain location: all over. The quality of the pain is described as dull. The pain is at a severity of 7/10. The pain is moderate. The pain does not radiate. Associated symptoms include anorexia.    Past Medical History  Diagnosis Date  . CAD (coronary artery disease)   . Elevated liver enzymes   . Diverticulitis     Hx of; requiring 3 admissions  . Sigmoid colon ulcer     Rectal polyps  . Thyroid disease   . Psoriasis     Per medical history form dated 06/13/10.  . Chronic insomnia     Per medical history form dated 06/13/10.  . Colitis     Per medical history from dated 06/13/10.  Marland Kitchen Hypertension   . Hypothyroidism   . Bronchitis     history of  . Back pain     chronic  . Arthritis   . 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 Colonoscopy 07/2008    Colitis,NSAID v. Ischemia,malrotation of the gut,Diverticulosis(L),hyperplastic  . Appendectomy   . Shoulder surgery     Left  . Rotator cuff repair     Left - per medical history form dated 06/13/10.  . Thyroidectomy     Per medical history form dated 06/13/10.  . Colon surgery 2012  . Total hip arthroplasty 07/04/2011    Procedure: TOTAL HIP ARTHROPLASTY;  Surgeon: Eulas Post, MD;  Location: [facility] OR;  Service: Orthopedics;  Laterality: Right;  .  Joint replacement   . Cardiac catheterization 2009  . Cardiac catheterization     Per medical history from dated 06/13/10.    Family History  Problem Relation Age of Onset  . Cancer Mother     breast cancer - per medical history form dated 06/13/10.  . Diverticulitis Mother   . Hypertension Mother   . Cancer Father     skin - per medical history form dated 06/13/10.  Marland Kitchen Hypertension Father   . Anesthesia problems Neg Hx   . Hypotension Neg Hx   . Malignant hyperthermia Neg Hx   . Pseudochol deficiency Neg Hx     History  Substance Use Topics  . Smoking status: Current Everyday Smoker -- 0.2 packs/day for 18 years  . Smokeless tobacco: Not on file   Comment: Has been wearing patch for 20 days  . Alcohol Use: Yes     Occasional      Review of Systems  Constitutional: Negative for fatigue.  HENT: Negative for congestion, sinus pressure and ear discharge.   Eyes: Negative for discharge.  Respiratory: Negative for cough.   Cardiovascular: Negative for chest pain.  Gastrointestinal: Positive for anorexia. Negative for abdominal pain and diarrhea.  Genitourinary: Negative for frequency and hematuria.  Musculoskeletal: Negative for back pain.  Skin: Negative for rash.  Neurological: Positive  for headaches. Negative for seizures.  Hematological: Negative.   Psychiatric/Behavioral: Negative for hallucinations.    Allergies  Review of patient's allergies indicates no known allergies.  Home Medications   Current Outpatient Rx  Name Route Sig Dispense Refill  . ACETAMINOPHEN 500 MG PO TABS Oral Take 1,000 mg by mouth 2 (two) times daily as needed.    Marland Kitchen AMLODIPINE BESYLATE 5 MG PO TABS Oral Take 1 tablet (5 mg total) by mouth daily. 30 tablet 1  . ASPIRIN 81 MG PO TBEC Oral Take 1 tablet (81 mg total) by mouth daily. 30 tablet 1  . CALCIUM 600 + D PO Oral Take 1 tablet by mouth 2 (two) times daily.    Marland Kitchen LEVOTHYROXINE SODIUM 200 MCG PO TABS Oral Take 200 mcg by mouth daily. Take  with 50 mcg to equal 250 mcg daily.    Marland Kitchen LEVOTHYROXINE SODIUM 50 MCG PO TABS Oral Take 50 mcg by mouth daily. Take with 200 mcg tablet to equal 250 mcg daily.    Marland Kitchen NITROGLYCERIN 0.4 MG SL SUBL Sublingual Place 1 tablet (0.4 mg total) under the tongue every 5 (five) minutes x 3 doses as needed for chest pain. 30 tablet 0  . POTASSIUM CHLORIDE CRYS ER 20 MEQ PO TBCR Oral Take 20 mEq by mouth daily.     Marland Kitchen ZOLPIDEM TARTRATE 10 MG PO TABS Oral Take 10 mg by mouth at bedtime.     . CEPHALEXIN 500 MG PO CAPS Oral Take 1 capsule (500 mg total) by mouth 4 (four) times daily. 28 capsule 0    BP 139/77  Pulse 101  Temp 102.3 F (39.1 C) (Oral)  Resp 18  Ht 6\' 1"  (1.854 m)  Wt 230 lb (104.327 kg)  BMI 30.34 kg/m2  SpO2 97%  Physical Exam  Constitutional: He is oriented to person, place, and time. He appears well-developed.  HENT:  Head: Normocephalic and atraumatic.  Eyes: Conjunctivae and EOM are normal. No scleral icterus.  Neck: Neck supple. No thyromegaly present.  Cardiovascular: Normal rate and regular rhythm.  Exam reveals no gallop and no friction rub.   No murmur heard. Pulmonary/Chest: No stridor. He has no wheezes. He has no rales. He exhibits no tenderness.  Abdominal: He exhibits no distension. There is no tenderness. There is no rebound.  Musculoskeletal: Normal range of motion. He exhibits no edema.  Lymphadenopathy:    He has no cervical adenopathy.  Neurological: He is oriented to person, place, and time. Coordination normal.  Skin: No rash noted. No erythema.  Psychiatric: He has a normal mood and affect. His behavior is normal.    ED Course  Procedures (including critical care time)  Labs Reviewed  CBC WITH DIFFERENTIAL - Abnormal; Notable for the following:    WBC 12.0 (*)     Neutro Abs 8.8 (*)     Monocytes Absolute 1.4 (*)     All other components within normal limits  COMPREHENSIVE METABOLIC PANEL - Abnormal; Notable for the following:    Potassium 3.3 (*)       Glucose, Bld 107 (*)     GFR calc non Af Amer 80 (*)     All other components within normal limits  URINALYSIS, ROUTINE W REFLEX MICROSCOPIC - Abnormal; Notable for the following:    Color, Urine AMBER (*)  BIOCHEMICALS MAY BE AFFECTED BY COLOR   Hgb urine dipstick TRACE (*)     All other components within normal limits  URINE MICROSCOPIC-ADD ON -  Abnormal; Notable for the following:    Bacteria, UA FEW (*)     All other components within normal limits  URINE CULTURE   Dg Chest Port 1 View  12/07/2011  *RADIOLOGY REPORT*  Clinical Data: Cough and fever.  PORTABLE CHEST - 1 VIEW  Comparison: 11/27/2011 and prior chest radiographs dating back to 2006  Findings: The cardiomediastinal silhouette is unremarkable.  There is no evidence of focal airspace disease, pulmonary edema, suspicious pulmonary nodule/mass, pleural effusion, or pneumothorax. No acute bony abnormalities are identified.  IMPRESSION: No evidence of acute cardiopulmonary disease.  Original Report Authenticated By: Rosendo Gros, M.D.     1. UTI (lower urinary tract infection)       MDM  The chart was scribed for me under my direct supervision.  I personally performed the history, physical, and medical decision making and all procedures in the evaluation of this patient.Benny Lennert, MD 12/07/11 2124

## 2011-12-08 ENCOUNTER — Emergency Department (HOSPITAL_COMMUNITY): Payer: 59

## 2011-12-08 ENCOUNTER — Emergency Department (HOSPITAL_COMMUNITY)
Admission: EM | Admit: 2011-12-08 | Discharge: 2011-12-09 | Disposition: A | Discharge status: Home / Self Care | Payer: 59 | Attending: Emergency Medicine | Admitting: Emergency Medicine

## 2011-12-08 ENCOUNTER — Encounter (HOSPITAL_COMMUNITY): Payer: Self-pay | Admitting: *Deleted

## 2011-12-08 DIAGNOSIS — E079 Disorder of thyroid, unspecified: Secondary | ICD-10-CM | POA: Insufficient documentation

## 2011-12-08 DIAGNOSIS — G8929 Other chronic pain: Secondary | ICD-10-CM | POA: Insufficient documentation

## 2011-12-08 DIAGNOSIS — I2510 Atherosclerotic heart disease of native coronary artery without angina pectoris: Secondary | ICD-10-CM | POA: Insufficient documentation

## 2011-12-08 DIAGNOSIS — I10 Essential (primary) hypertension: Secondary | ICD-10-CM | POA: Insufficient documentation

## 2011-12-08 DIAGNOSIS — Z8739 Personal history of other diseases of the musculoskeletal system and connective tissue: Secondary | ICD-10-CM | POA: Insufficient documentation

## 2011-12-08 DIAGNOSIS — Z7982 Long term (current) use of aspirin: Secondary | ICD-10-CM | POA: Insufficient documentation

## 2011-12-08 DIAGNOSIS — Z79899 Other long term (current) drug therapy: Secondary | ICD-10-CM | POA: Insufficient documentation

## 2011-12-08 DIAGNOSIS — J189 Pneumonia, unspecified organism: Secondary | ICD-10-CM | POA: Insufficient documentation

## 2011-12-08 DIAGNOSIS — R112 Nausea with vomiting, unspecified: Secondary | ICD-10-CM

## 2011-12-08 LAB — COMPREHENSIVE METABOLIC PANEL
ALT: 26 U/L (ref 0–53)
AST: 19 U/L (ref 0–37)
Albumin: 3.6 g/dL (ref 3.5–5.2)
Alkaline Phosphatase: 85 U/L (ref 39–117)
BUN: 9 mg/dL (ref 6–23)
CO2: 23 mEq/L (ref 19–32)
Calcium: 9.7 mg/dL (ref 8.4–10.5)
Chloride: 102 mEq/L (ref 96–112)
Creatinine, Ser: 1.07 mg/dL (ref 0.50–1.35)
GFR calc Af Amer: >90 mL/min (ref >90)
GFR calc non Af Amer: 82 mL/min — ABNORMAL LOW (ref >90)
Glucose, Bld: 110 mg/dL — ABNORMAL HIGH (ref 70–99)
Potassium: 3.5 mEq/L (ref 3.5–5.1)
Sodium: 135 mEq/L (ref 135–145)
Total Bilirubin: 0.6 mg/dL (ref 0.3–1.2)
Total Protein: 7.4 g/dL (ref 6.0–8.3)

## 2011-12-08 LAB — CBC WITH DIFFERENTIAL/PLATELET
Basophils Absolute: 0 10*3/uL (ref 0.0–0.1)
Basophils Relative: 0 % (ref 0–1)
Eosinophils Absolute: 0.1 10*3/uL (ref 0.0–0.7)
Eosinophils Relative: 1 % (ref 0–5)
HCT: 39.9 % (ref 39.0–52.0)
Hemoglobin: 14.2 g/dL (ref 13.0–17.0)
Lymphocytes Relative: 10 % — ABNORMAL LOW (ref 12–46)
Lymphs Abs: 1.2 10*3/uL (ref 0.7–4.0)
MCH: 33.1 pg (ref 26.0–34.0)
MCHC: 35.6 g/dL (ref 30.0–36.0)
MCV: 93 fL (ref 78.0–100.0)
Monocytes Absolute: 1.1 10*3/uL — ABNORMAL HIGH (ref 0.1–1.0)
Monocytes Relative: 9 % (ref 3–12)
Neutro Abs: 9.2 10*3/uL — ABNORMAL HIGH (ref 1.7–7.7)
Neutrophils Relative %: 80 % — ABNORMAL HIGH (ref 43–77)
Platelets: 199 10*3/uL (ref 150–400)
RBC: 4.29 MIL/uL (ref 4.22–5.81)
RDW: 12.4 % (ref 11.5–15.5)
WBC: 11.5 10*3/uL — ABNORMAL HIGH (ref 4.0–10.5)

## 2011-12-08 LAB — URINE CULTURE
Colony Count: NO GROWTH
Culture: NO GROWTH

## 2011-12-08 LAB — URINALYSIS, ROUTINE W REFLEX MICROSCOPIC
Bilirubin Urine: NEGATIVE
Glucose, UA: NEGATIVE mg/dL
Leukocytes, UA: NEGATIVE
Nitrite: NEGATIVE
Specific Gravity, Urine: 1.02 (ref 1.005–1.030)
Urobilinogen, UA: 0.2 mg/dL (ref 0.0–1.0)
pH: 6.5 (ref 5.0–8.0)

## 2011-12-08 LAB — URINE MICROSCOPIC-ADD ON

## 2011-12-08 LAB — LIPASE, BLOOD: Lipase: 16 U/L (ref 11–59)

## 2011-12-08 MED ORDER — FAMOTIDINE IN NACL 20-0.9 MG/50ML-% IV SOLN
20.0000 mg | Freq: Once | INTRAVENOUS | Status: AC
  Administered 2011-12-08: 20 mg via INTRAVENOUS
  Filled 2011-12-08: qty 50

## 2011-12-08 MED ORDER — ONDANSETRON 4 MG PO TBDP: 4.0000 mg | ORAL_TABLET | Freq: Three times a day (TID) | ORAL | Status: AC | PRN

## 2011-12-08 MED ORDER — SODIUM CHLORIDE 0.9 % IV BOLUS (SEPSIS)
500.0000 mL | Freq: Once | INTRAVENOUS | Status: AC
  Administered 2011-12-08: 500 mL via INTRAVENOUS

## 2011-12-08 MED ORDER — SODIUM CHLORIDE 0.9 % IV SOLN
INTRAVENOUS | Status: DC
  Administered 2011-12-08: 21:00:00 via INTRAVENOUS

## 2011-12-08 MED ORDER — ACETAMINOPHEN 325 MG PO TABS
650.0000 mg | ORAL_TABLET | Freq: Once | ORAL | Status: AC
  Administered 2011-12-08: 650 mg via ORAL
  Filled 2011-12-08: qty 2

## 2011-12-08 MED ORDER — IOHEXOL 300 MG/ML  SOLN
100.0000 mL | Freq: Once | INTRAMUSCULAR | Status: AC | PRN
  Administered 2011-12-08: 100 mL via INTRAVENOUS

## 2011-12-08 MED ORDER — MOXIFLOXACIN HCL 400 MG PO TABS: 400.0000 mg | ORAL_TABLET | Freq: Every day | ORAL | Status: AC

## 2011-12-08 MED ORDER — FENTANYL CITRATE 0.05 MG/ML IJ SOLN
50.0000 ug | INTRAMUSCULAR | Status: DC | PRN
  Administered 2011-12-08: 50 ug via INTRAVENOUS
  Filled 2011-12-08: qty 2

## 2011-12-08 MED ORDER — HYDROCODONE-ACETAMINOPHEN 5-325 MG PO TABS: 1.0000 | ORAL_TABLET | Freq: Four times a day (QID) | ORAL | Status: AC | PRN

## 2011-12-08 MED ORDER — MOXIFLOXACIN HCL IN NACL 400 MG/250ML IV SOLN
400.0000 mg | Freq: Once | INTRAVENOUS | Status: AC
  Administered 2011-12-08: 400 mg via INTRAVENOUS
  Filled 2011-12-08: qty 250

## 2011-12-08 MED ORDER — ONDANSETRON HCL 4 MG/2ML IJ SOLN
4.0000 mg | INTRAMUSCULAR | Status: AC | PRN
  Administered 2011-12-08 (×2): 4 mg via INTRAVENOUS
  Filled 2011-12-08 (×2): qty 2

## 2011-12-08 NOTE — ED Notes (Addendum)
Pt reporting continued headache.  States worsening since vomiting.

## 2011-12-08 NOTE — ED Notes (Signed)
Fever,n/v, dizzy, headache, cramping, Seen here yesterday, and dx with uti., vomiting.

## 2011-12-08 NOTE — ED Notes (Signed)
Called to pt's room secondary to pt having dry heaves with increasing nausea. Pt drank all of ct scan contrast before this episode began.  PRN dose of zofran given per pt request. Pt states vomited small amts phlegm.

## 2011-12-08 NOTE — ED Provider Notes (Signed)
History     CSN: [id number]  Arrival date & time 12/08/11  1800   First MD Initiated Contact with Patient 12/08/11 1900      Chief Complaint  Patient presents with  . Fever    HPI Pt was seen at 1930.  Per pt, c/o gradual onset and persistence of constant multiple symptoms for the past 2 days.  Symptoms include:  generalized weakness/fatigue, multiple intermittent episodes of N/V, muscles "cramping," upper abd pain, home fevers to "102," generalized headache.  Pt has been taking tylenol and motrin with transient improvement. Pt was eval in the ED yesterday for same symptoms, and states he is "not better today."  States he has been outside "golfing a lot," but denies any recent tick bites.  Denies rash, no back pain, no diarrhea, no black or blood in stools or emesis, no CP/SOB, no cough.    Past Medical History  Diagnosis Date  . CAD (coronary artery disease)   . Elevated liver enzymes   . Diverticulitis     Hx of; requiring 3 admissions  . Sigmoid colon ulcer     Rectal polyps  . Thyroid disease   . Psoriasis     Per medical history form dated 06/13/10.  . Chronic insomnia     Per medical history form dated 06/13/10.  . Colitis     Per medical history from dated 06/13/10.  Marland Kitchen Hypertension   . Hypothyroidism   . Bronchitis     history of  . Back pain     chronic  . Arthritis   . 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 Colonoscopy 07/2008    Colitis,NSAID v. Ischemia,malrotation of the gut,Diverticulosis(L),hyperplastic  . Appendectomy   . Shoulder surgery     Left  . Rotator cuff repair     Left - per medical history form dated 06/13/10.  . Thyroidectomy     Per medical history form dated 06/13/10.  . Colon surgery 2012  . Total hip arthroplasty 07/04/2011    Procedure: TOTAL HIP ARTHROPLASTY;  Surgeon: Eulas Post, MD;  Location: [facility] OR;  Service: Orthopedics;  Laterality: Right;  . Joint replacement   .  Cardiac catheterization 2009  . Cardiac catheterization     Per medical history from dated 06/13/10.    Family History  Problem Relation Age of Onset  . Cancer Mother     breast cancer - per medical history form dated 06/13/10.  . Diverticulitis Mother   . Hypertension Mother   . Cancer Father     skin - per medical history form dated 06/13/10.  Marland Kitchen Hypertension Father   . Anesthesia problems Neg Hx   . Hypotension Neg Hx   . Malignant hyperthermia Neg Hx   . Pseudochol deficiency Neg Hx     History  Substance Use Topics  . Smoking status: Current Everyday Smoker -- 0.2 packs/day for 18 years  . Smokeless tobacco: Not on file   Comment: Has been wearing patch for 20 days  . Alcohol Use: Yes     Occasional    Review of Systems ROS: Statement: All systems negative except as marked or noted in the HPI; Constitutional: +fever and chills, generalized weakness/fatigue. ; ; Eyes: Negative for eye pain, redness and discharge. ; ; ENMT: Negative for ear pain, hoarseness, nasal congestion, sinus pressure and sore throat. ; ; Cardiovascular: Negative for chest pain, palpitations, diaphoresis, dyspnea and peripheral edema. ; ;  Respiratory: Negative for cough, wheezing and stridor. ; ; Gastrointestinal: +N/V, abd pain. Negative for diarrhea, blood in stool, hematemesis, jaundice and rectal bleeding. . ; ; Genitourinary: Negative for dysuria, flank pain and hematuria. ; ; Musculoskeletal: Negative for back pain and neck pain. Negative for swelling and trauma.; ; Skin: Negative for pruritus, rash, abrasions, blisters, bruising and skin lesion.; ; Neuro: +headache. Negative for lightheadedness and neck stiffness. Negative for altered level of consciousness , altered mental status, extremity weakness, paresthesias, involuntary movement, seizure and syncope.     Allergies  Review of patient's allergies indicates no known allergies.  Home Medications   Current Outpatient Rx  Name Route Sig Dispense  Refill  . ACETAMINOPHEN 500 MG PO TABS Oral Take 1,000 mg by mouth 2 (two) times daily as needed.    Marland Kitchen AMLODIPINE BESYLATE 5 MG PO TABS Oral Take 1 tablet (5 mg total) by mouth daily. 30 tablet 1  . ASPIRIN 81 MG PO TBEC Oral Take 1 tablet (81 mg total) by mouth daily. 30 tablet 1  . CALCIUM 600 + D PO Oral Take 1 tablet by mouth 2 (two) times daily.    . CEPHALEXIN 500 MG PO CAPS Oral Take 1 capsule (500 mg total) by mouth 4 (four) times daily. 28 capsule 0  . LEVOTHYROXINE SODIUM 200 MCG PO TABS Oral Take 200 mcg by mouth daily. Take with 50 mcg to equal 250 mcg daily.    Marland Kitchen LEVOTHYROXINE SODIUM 50 MCG PO TABS Oral Take 50 mcg by mouth daily. Take with 200 mcg tablet to equal 250 mcg daily.    Marland Kitchen NITROGLYCERIN 0.4 MG SL SUBL Sublingual Place 1 tablet (0.4 mg total) under the tongue every 5 (five) minutes x 3 doses as needed for chest pain. 30 tablet 0  . POTASSIUM CHLORIDE CRYS ER 20 MEQ PO TBCR Oral Take 20 mEq by mouth daily.     Marland Kitchen ZOLPIDEM TARTRATE 10 MG PO TABS Oral Take 10 mg by mouth at bedtime.       BP 133/72  Pulse 90  Temp 99.2 F (37.3 C) (Oral)  Resp 20  Wt 230 lb (104.327 kg)  SpO2 90%  Physical Exam 1935: Physical examination:  Nursing notes reviewed; Vital signs and O2 SAT reviewed;  Constitutional: Well developed, Well nourished, Well hydrated, In no acute distress; Head:  Normocephalic, atraumatic; Eyes: EOMI, PERRL, No scleral icterus; ENMT: Mouth and pharynx normal, Mucous membranes moist; Neck: Supple, Full range of motion, No lymphadenopathy; Cardiovascular: Regular rate and rhythm, No gallop; Respiratory: Breath sounds clear & equal bilaterally, No wheezes.  Speaking full sentences with ease, Normal respiratory effort/excursion; Chest: Nontender, Movement normal; Abdomen: Soft, +mild mid-epigastric tenderness to palp.  No rebound or guarding. Nondistended, Normal bowel sounds;; Extremities: Pulses normal, No tenderness, No edema, No calf edema or asymmetry.; Neuro:  AA&Ox3, Major CN grossly intact.  Speech clear. No gross focal motor or sensory deficits in extremities.; Skin: Color normal, Warm, Dry.   ED Course  Procedures   MDM  MDM Reviewed: nursing note, vitals and previous chart Reviewed previous: labs Interpretation: labs and x-ray   Results for orders placed during the hospital encounter of 12/08/11  CBC WITH DIFFERENTIAL      Component Value Range   WBC 11.5 (*) 4.0 - 10.5 K/uL   RBC 4.29  4.22 - 5.81 MIL/uL   Hemoglobin 14.2  13.0 - 17.0 g/dL   HCT 16.1  09.6 - 04.5 %   MCV 93.0  78.0 -  100.0 fL   MCH 33.1  26.0 - 34.0 pg   MCHC 35.6  30.0 - 36.0 g/dL   RDW 21.3  08.6 - 57.8 %   Platelets 199  150 - 400 K/uL   Neutrophils Relative 80 (*) 43 - 77 %   Neutro Abs 9.2 (*) 1.7 - 7.7 K/uL   Lymphocytes Relative 10 (*) 12 - 46 %   Lymphs Abs 1.2  0.7 - 4.0 K/uL   Monocytes Relative 9  3 - 12 %   Monocytes Absolute 1.1 (*) 0.1 - 1.0 K/uL   Eosinophils Relative 1  0 - 5 %   Eosinophils Absolute 0.1  0.0 - 0.7 K/uL   Basophils Relative 0  0 - 1 %   Basophils Absolute 0.0  0.0 - 0.1 K/uL  COMPREHENSIVE METABOLIC PANEL      Component Value Range   Sodium 135  135 - 145 mEq/L   Potassium 3.5  3.5 - 5.1 mEq/L   Chloride 102  96 - 112 mEq/L   CO2 23  19 - 32 mEq/L   Glucose, Bld 110 (*) 70 - 99 mg/dL   BUN 9  6 - 23 mg/dL   Creatinine, Ser 4.69  0.50 - 1.35 mg/dL   Calcium 9.7  8.4 - 62.9 mg/dL   Total Protein 7.4  6.0 - 8.3 g/dL   Albumin 3.6  3.5 - 5.2 g/dL   AST 19  0 - 37 U/L   ALT 26  0 - 53 U/L   Alkaline Phosphatase 85  39 - 117 U/L   Total Bilirubin 0.6  0.3 - 1.2 mg/dL   GFR calc non Af Amer 82 (*) >90 mL/min   GFR calc Af Amer >90  >90 mL/min  LIPASE, BLOOD      Component Value Range   Lipase 16  11 - 59 U/L  URINALYSIS, ROUTINE W REFLEX MICROSCOPIC      Component Value Range   Color, Urine AMBER (*) YELLOW   APPearance CLEAR  CLEAR   Specific Gravity, Urine 1.020  1.005 - 1.030   pH 6.5  5.0 - 8.0   Glucose, UA  NEGATIVE  NEGATIVE mg/dL   Hgb urine dipstick TRACE (*) NEGATIVE   Bilirubin Urine NEGATIVE  NEGATIVE   Ketones, ur TRACE (*) NEGATIVE mg/dL   Protein, ur TRACE (*) NEGATIVE mg/dL   Urobilinogen, UA 0.2  0.0 - 1.0 mg/dL   Nitrite NEGATIVE  NEGATIVE   Leukocytes, UA NEGATIVE  NEGATIVE  URINE MICROSCOPIC-ADD ON      Component Value Range   Squamous Epithelial / LPF RARE  RARE   WBC, UA 0-2  <3 WBC/hpf   RBC / HPF 3-6  <3 RBC/hpf   Bacteria, UA FEW (*) RARE    Dg Abd Acute W/chest 12/08/2011  *RADIOLOGY REPORT*  Clinical Data: Abdominal pain.  Nausea, vomiting and diarrhea.  ACUTE ABDOMEN SERIES (ABDOMEN 2 VIEW & CHEST 1 VIEW)  Comparison: Multiple prior chest radiographs.  Acute abdominal series 08/11/2010.  Findings: Lung volumes are normal.  No consolidative airspace disease.  No pleural effusions.  No pneumothorax.  No pulmonary nodule or mass noted.  Pulmonary vasculature and the cardiomediastinal silhouette are within normal limits.  Again noted is a prominent right first costochondral junction, unchanged compared to numerous prior examinations.  No pneumoperitoneum.  Supine and upright views of the abdomen demonstrate gas and stool scattered throughout the colon extending to the level of the distal rectum.  There are  some borderline dilated loops of gas-filled small bowel measuring approximately 3 cm in diameter projecting over the right side of the abdomen.  Status post right total hip arthroplasty.  IMPRESSION: 1.  Nonspecific bowel gas pattern, as above.  Strictly speaking, an early or partial small bowel obstruction would be difficult to entirely exclude, although this is not strongly favored.  This could be better evaluated with CT scan if clinically indicated. 2. No pneumoperitoneum. 3.  No radiographic evidence of acute cardiopulmonary disease.  Original Report Authenticated By: Florencia Reasons, M.D.      2045:  WBC improving from yesterday.  Afebrile while in the ED.  VSS, not  orthostatic.  Pt continues to c/o upper abd pain.  XR unable to r/o SBO.  Does have hx of abd surgery, and has multiple episodes of N/V x2 days.  Will check CT scan.   2200:  Apparently was nauseated after drinking PO contrast for CT scan and "vomited phlegm."  ED RN gave IV zofran.  CT scan pending, treatment based on results.  If CT negative for acute process, may need empirical tx for RMSF given his symptoms; will also need PO fluid challenge if can be discharged.  Pt's UC from yesterday is NGTD, pt can stop taking keflex.  Dx testing, as well as above plan of care, d/w pt and family.  Questions answered.  Verb understanding.  Sign out to Dr. Estell Harpin.          Laray Anger, DO 12/10/11 2126

## 2011-12-09 MED ORDER — ONDANSETRON 4 MG PO TBDP
ORAL_TABLET | ORAL | Status: AC
  Administered 2011-12-09: 4 mg
  Filled 2011-12-09: qty 1

## 2011-12-10 LAB — URINE CULTURE
Colony Count: NO GROWTH
Culture: NO GROWTH

## 2012-05-28 ENCOUNTER — Encounter (HOSPITAL_COMMUNITY): Payer: Self-pay | Admitting: Emergency Medicine

## 2012-05-28 ENCOUNTER — Emergency Department (HOSPITAL_COMMUNITY): Payer: 59

## 2012-05-28 ENCOUNTER — Emergency Department (HOSPITAL_COMMUNITY)
Admission: EM | Admit: 2012-05-28 | Discharge: 2012-05-28 | Disposition: A | Discharge status: Home / Self Care | Payer: 59 | Attending: Emergency Medicine | Admitting: Emergency Medicine

## 2012-05-28 DIAGNOSIS — R110 Nausea: Secondary | ICD-10-CM | POA: Insufficient documentation

## 2012-05-28 DIAGNOSIS — Z87898 Personal history of other specified conditions: Secondary | ICD-10-CM | POA: Insufficient documentation

## 2012-05-28 DIAGNOSIS — I10 Essential (primary) hypertension: Secondary | ICD-10-CM | POA: Insufficient documentation

## 2012-05-28 DIAGNOSIS — E039 Hypothyroidism, unspecified: Secondary | ICD-10-CM | POA: Insufficient documentation

## 2012-05-28 DIAGNOSIS — Z860100 Personal history of colon polyps, unspecified: Secondary | ICD-10-CM | POA: Insufficient documentation

## 2012-05-28 DIAGNOSIS — Z79899 Other long term (current) drug therapy: Secondary | ICD-10-CM | POA: Insufficient documentation

## 2012-05-28 DIAGNOSIS — Z8709 Personal history of other diseases of the respiratory system: Secondary | ICD-10-CM | POA: Insufficient documentation

## 2012-05-28 DIAGNOSIS — G8929 Other chronic pain: Secondary | ICD-10-CM | POA: Insufficient documentation

## 2012-05-28 DIAGNOSIS — Z8719 Personal history of other diseases of the digestive system: Secondary | ICD-10-CM | POA: Insufficient documentation

## 2012-05-28 DIAGNOSIS — I2510 Atherosclerotic heart disease of native coronary artery without angina pectoris: Secondary | ICD-10-CM | POA: Insufficient documentation

## 2012-05-28 DIAGNOSIS — Z7982 Long term (current) use of aspirin: Secondary | ICD-10-CM | POA: Insufficient documentation

## 2012-05-28 DIAGNOSIS — F17200 Nicotine dependence, unspecified, uncomplicated: Secondary | ICD-10-CM | POA: Insufficient documentation

## 2012-05-28 DIAGNOSIS — Z8739 Personal history of other diseases of the musculoskeletal system and connective tissue: Secondary | ICD-10-CM | POA: Insufficient documentation

## 2012-05-28 DIAGNOSIS — H53149 Visual discomfort, unspecified: Secondary | ICD-10-CM | POA: Insufficient documentation

## 2012-05-28 DIAGNOSIS — R51 Headache: Secondary | ICD-10-CM | POA: Insufficient documentation

## 2012-05-28 MED ORDER — PROMETHAZINE HCL 25 MG/ML IJ SOLN
25.0000 mg | Freq: Once | INTRAMUSCULAR | Status: AC
  Administered 2012-05-28: 25 mg via INTRAMUSCULAR
  Filled 2012-05-28: qty 1

## 2012-05-28 MED ORDER — OXYCODONE-ACETAMINOPHEN 5-325 MG PO TABS: 1.0000 | ORAL_TABLET | Freq: Four times a day (QID) | ORAL | Status: DC | PRN

## 2012-05-28 MED ORDER — HYDROMORPHONE HCL PF 2 MG/ML IJ SOLN
2.0000 mg | Freq: Once | INTRAMUSCULAR | Status: AC
  Administered 2012-05-28: 2 mg via INTRAMUSCULAR
  Filled 2012-05-28: qty 1

## 2012-05-28 NOTE — ED Notes (Signed)
Discharge instructions given and reviewed with patient and wife.  Prescription given for Percocet; effects and use explained.  Patient verbalized understanding of sedating effects of medication.  Patient discharged home in good condition; wife accompanied discharge to drive home.

## 2012-05-28 NOTE — ED Provider Notes (Signed)
History     CSN: [id number]  Arrival date & time 05/28/12  0213   First MD Initiated Contact with Patient 05/28/12 [phone]      Chief Complaint  Patient presents with  . Headache    (Consider location/radiation/quality/duration/timing/severity/associated sxs/prior treatment) HPI Comments: Patient presents with headache that started last night at 9PM.  He denies any injury or trauma.  No fevers or chills.  Denies any neck pain.  Never had a headache like this before.  Patient is a 47 y.o. male presenting with headaches. The history is provided by the patient.  Headache  This is a new problem. Episode onset: 2100 last night. The problem occurs constantly. The problem has been rapidly worsening. The headache is associated with bright light and activity. The pain is located in the occipital region. The quality of the pain is described as throbbing. The pain is severe. The pain does not radiate. Associated symptoms include nausea. Pertinent negatives include no fever, no palpitations and no vomiting. He has tried NSAIDs for the symptoms. The treatment provided no relief.    Past Medical History  Diagnosis Date  . CAD (coronary artery disease)   . Elevated liver enzymes   . Diverticulitis     Hx of; requiring 3 admissions  . Sigmoid colon ulcer     Rectal polyps  . Thyroid disease   . Psoriasis     Per medical history form dated 06/13/10.  . Chronic insomnia     Per medical history form dated 06/13/10.  . Colitis     Per medical history from dated 06/13/10.  Marland Kitchen Hypertension   . Hypothyroidism   . Bronchitis     history of  . Back pain     chronic  . Arthritis   . 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 Colonoscopy 07/2008    Colitis,NSAID v. Ischemia,malrotation of the gut,Diverticulosis(L),hyperplastic  . Appendectomy   . Shoulder surgery     Left  . Rotator cuff repair     Left - per medical history form dated  06/13/10.  . Thyroidectomy     Per medical history form dated 06/13/10.  . Colon surgery 2012  . Total hip arthroplasty 07/04/2011    Procedure: TOTAL HIP ARTHROPLASTY;  Surgeon: Eulas Post, MD;  Location: [facility] OR;  Service: Orthopedics;  Laterality: Right;  . Joint replacement   . Cardiac catheterization 2009  . Cardiac catheterization     Per medical history from dated 06/13/10.    Family History  Problem Relation Age of Onset  . Cancer Mother     breast cancer - per medical history form dated 06/13/10.  . Diverticulitis Mother   . Hypertension Mother   . Cancer Father     skin - per medical history form dated 06/13/10.  Marland Kitchen Hypertension Father   . Anesthesia problems Neg Hx   . Hypotension Neg Hx   . Malignant hyperthermia Neg Hx   . Pseudochol deficiency Neg Hx     History  Substance Use Topics  . Smoking status: Current Every Day Smoker -- 0.2 packs/day for 18 years  . Smokeless tobacco: Not on file     Comment: Has been wearing patch for 20 days  . Alcohol Use: Yes     Comment: Occasional      Review of Systems  Constitutional: Negative for fever.  Cardiovascular: Negative for palpitations.  Gastrointestinal: Positive for nausea. Negative for  vomiting.  Neurological: Positive for headaches.  All other systems reviewed and are negative.    Allergies  Review of patient's allergies indicates no known allergies.  Home Medications   Current Outpatient Rx  Name  Route  Sig  Dispense  Refill  . ASPIRIN 81 MG PO TBEC   Oral   Take 1 tablet (81 mg total) by mouth daily.   30 tablet   1   . LEVOTHYROXINE SODIUM 200 MCG PO TABS   Oral   Take 200 mcg by mouth daily. Take with 50 mcg to equal 250 mcg daily.         Marland Kitchen LEVOTHYROXINE SODIUM 50 MCG PO TABS   Oral   Take 50 mcg by mouth daily. Take with 200 mcg tablet to equal 250 mcg daily.         Marland Kitchen NITROGLYCERIN 0.4 MG SL SUBL   Sublingual   Place 1 tablet (0.4 mg total) under the tongue every 5 (five) minutes x  3 doses as needed for chest pain.   30 tablet   0   . POTASSIUM CHLORIDE CRYS ER 20 MEQ PO TBCR   Oral   Take 20 mEq by mouth daily.          . ACETAMINOPHEN 500 MG PO TABS   Oral   Take 1,000 mg by mouth 2 (two) times daily as needed.         Marland Kitchen AMLODIPINE BESYLATE 5 MG PO TABS   Oral   Take 1 tablet (5 mg total) by mouth daily.   30 tablet   1   . CALCIUM 600 + D PO   Oral   Take 1 tablet by mouth 2 (two) times daily.         Marland Kitchen ZOLPIDEM TARTRATE 10 MG PO TABS   Oral   Take 10 mg by mouth at bedtime.            BP 155/108  Pulse 80  Temp 97.7 F (36.5 C) (Oral)  Resp 20  Ht 6' (1.829 m)  Wt 250 lb (113.399 kg)  BMI 33.91 kg/m2  SpO2 96%  Physical Exam  Nursing note and vitals reviewed. Constitutional: He is oriented to person, place, and time. He appears well-developed and well-nourished. No distress.  HENT:  Head: Normocephalic and atraumatic.  Mouth/Throat: Oropharynx is clear and moist.  Eyes: EOM are normal. Pupils are equal, round, and reactive to light.       There is no papilledema on fundoscopic exam.  Neck: Normal range of motion. Neck supple.  Cardiovascular: Normal rate and regular rhythm.   No murmur heard. Pulmonary/Chest: Effort normal and breath sounds normal.  Abdominal: Soft. Bowel sounds are normal. He exhibits no distension. There is no tenderness.  Musculoskeletal: Normal range of motion. He exhibits no edema.  Lymphadenopathy:    He has no cervical adenopathy.  Neurological: He is alert and oriented to person, place, and time. No cranial nerve deficit. He exhibits normal muscle tone. Coordination normal.  Skin: Skin is warm and dry. He is not diaphoretic.    ED Course  Procedures (including critical care time)  Labs Reviewed - No data to display No results found.   No diagnosis found.    MDM  Headache relieved with meds given.  Head ct negative.  No indications for LP at this time.  Will discharge with a few percocet  should the symptoms return.  Geoffery Lyons, MD 05/28/12 [phone]

## 2012-05-28 NOTE — ED Notes (Signed)
Patient c/o headache since 2100 last night.  Patient c/o dizziness, blurred vision and nausea.

## 2012-05-28 NOTE — ED Notes (Signed)
Patient transported to CT scan via stretcher.

## 2012-08-09 ENCOUNTER — Emergency Department (HOSPITAL_COMMUNITY)
Admission: EM | Admit: 2012-08-09 | Discharge: 2012-08-10 | Disposition: A | Discharge status: Home / Self Care | Payer: 59 | Attending: Emergency Medicine | Admitting: Emergency Medicine

## 2012-08-09 ENCOUNTER — Encounter (HOSPITAL_COMMUNITY): Payer: Self-pay | Admitting: *Deleted

## 2012-08-09 ENCOUNTER — Emergency Department (HOSPITAL_COMMUNITY): Payer: 59

## 2012-08-09 DIAGNOSIS — R071 Chest pain on breathing: Secondary | ICD-10-CM | POA: Insufficient documentation

## 2012-08-09 DIAGNOSIS — F17200 Nicotine dependence, unspecified, uncomplicated: Secondary | ICD-10-CM | POA: Insufficient documentation

## 2012-08-09 DIAGNOSIS — I2510 Atherosclerotic heart disease of native coronary artery without angina pectoris: Secondary | ICD-10-CM | POA: Insufficient documentation

## 2012-08-09 DIAGNOSIS — R5381 Other malaise: Secondary | ICD-10-CM | POA: Insufficient documentation

## 2012-08-09 DIAGNOSIS — Z872 Personal history of diseases of the skin and subcutaneous tissue: Secondary | ICD-10-CM | POA: Insufficient documentation

## 2012-08-09 DIAGNOSIS — G4700 Insomnia, unspecified: Secondary | ICD-10-CM | POA: Insufficient documentation

## 2012-08-09 DIAGNOSIS — R748 Abnormal levels of other serum enzymes: Secondary | ICD-10-CM | POA: Insufficient documentation

## 2012-08-09 DIAGNOSIS — IMO0001 Reserved for inherently not codable concepts without codable children: Secondary | ICD-10-CM | POA: Insufficient documentation

## 2012-08-09 DIAGNOSIS — R350 Frequency of micturition: Secondary | ICD-10-CM | POA: Insufficient documentation

## 2012-08-09 DIAGNOSIS — R300 Dysuria: Secondary | ICD-10-CM | POA: Insufficient documentation

## 2012-08-09 DIAGNOSIS — Z8739 Personal history of other diseases of the musculoskeletal system and connective tissue: Secondary | ICD-10-CM | POA: Insufficient documentation

## 2012-08-09 DIAGNOSIS — Z8719 Personal history of other diseases of the digestive system: Secondary | ICD-10-CM | POA: Insufficient documentation

## 2012-08-09 DIAGNOSIS — R6883 Chills (without fever): Secondary | ICD-10-CM | POA: Insufficient documentation

## 2012-08-09 DIAGNOSIS — G4730 Sleep apnea, unspecified: Secondary | ICD-10-CM | POA: Insufficient documentation

## 2012-08-09 DIAGNOSIS — R0609 Other forms of dyspnea: Secondary | ICD-10-CM | POA: Insufficient documentation

## 2012-08-09 DIAGNOSIS — Z9889 Other specified postprocedural states: Secondary | ICD-10-CM | POA: Insufficient documentation

## 2012-08-09 DIAGNOSIS — J3489 Other specified disorders of nose and nasal sinuses: Secondary | ICD-10-CM | POA: Insufficient documentation

## 2012-08-09 DIAGNOSIS — R059 Cough, unspecified: Secondary | ICD-10-CM | POA: Insufficient documentation

## 2012-08-09 DIAGNOSIS — R05 Cough: Secondary | ICD-10-CM | POA: Insufficient documentation

## 2012-08-09 DIAGNOSIS — Z7982 Long term (current) use of aspirin: Secondary | ICD-10-CM | POA: Insufficient documentation

## 2012-08-09 DIAGNOSIS — Z79899 Other long term (current) drug therapy: Secondary | ICD-10-CM | POA: Insufficient documentation

## 2012-08-09 DIAGNOSIS — I10 Essential (primary) hypertension: Secondary | ICD-10-CM | POA: Insufficient documentation

## 2012-08-09 DIAGNOSIS — Z8709 Personal history of other diseases of the respiratory system: Secondary | ICD-10-CM | POA: Insufficient documentation

## 2012-08-09 DIAGNOSIS — R0989 Other specified symptoms and signs involving the circulatory and respiratory systems: Secondary | ICD-10-CM | POA: Insufficient documentation

## 2012-08-09 DIAGNOSIS — R0789 Other chest pain: Secondary | ICD-10-CM

## 2012-08-09 DIAGNOSIS — E039 Hypothyroidism, unspecified: Secondary | ICD-10-CM | POA: Insufficient documentation

## 2012-08-09 DIAGNOSIS — R110 Nausea: Secondary | ICD-10-CM | POA: Insufficient documentation

## 2012-08-09 DIAGNOSIS — J811 Chronic pulmonary edema: Secondary | ICD-10-CM | POA: Insufficient documentation

## 2012-08-09 NOTE — ED Notes (Signed)
Pt reports was at work & became more SOB. Pt states symptom started on Thursday. States minor CP that have went away, pt states chills & SOB. Pt states had PNA last year.

## 2012-08-09 NOTE — ED Provider Notes (Signed)
History    This chart was scribed for Loren Racer, MD by Gerlean Ren, ED Scribe. This patient was seen in room APA11/APA11 and the patient's care was started at 11:39 PM    CSN: [id number]  Arrival date & time 08/09/12  2334   First MD Initiated Contact with Patient 08/09/12 2336      Chief Complaint  Patient presents with  . Shortness of Breath     HPI Andrew Love is a 47 y.o. male and HTN who presents to the Emergency Department complaining of dyspnea for the past several days worse tonight at work. SOB associated anterior chest wall pain that pt states feel like "lung pain" worsened with breathing that pt states feels similar to when he had pneumonia last year. Pt admits to a lot of heavy lifting yesterday. He states his SOB is worse with exertion and lying flat. States he wakes suddenly at night SOB. Pt had catheterization with very minimal CAD, 1 vessel 30% blockage but noted vasospasms. EF at the time was normal. Pt has had minimal cough, non-productive. No fever, lower ext swelling, recent travel.  Pt has been taking prednisone with no relief.    Past Medical History  Diagnosis Date  . CAD (coronary artery disease)   . Elevated liver enzymes   . Diverticulitis     Hx of; requiring 3 admissions  . Sigmoid colon ulcer     Rectal polyps  . Thyroid disease   . Psoriasis     Per medical history form dated 06/13/10.  . Chronic insomnia     Per medical history form dated 06/13/10.  . Colitis     Per medical history from dated 06/13/10.  Marland Kitchen Hypertension   . Hypothyroidism   . Bronchitis     history of  . Back pain     chronic  . Arthritis   . 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 Colonoscopy  07/2008    Colitis,NSAID v. Ischemia,malrotation of the gut,Diverticulosis(L),hyperplastic  . Appendectomy    . Shoulder surgery      Left  . Rotator cuff repair      Left - per medical history form  dated 06/13/10.  . Thyroidectomy      Per medical history form dated 06/13/10.  . Colon surgery  2012  . Total hip arthroplasty  07/04/2011    Procedure: TOTAL HIP ARTHROPLASTY;  Surgeon: Eulas Post, MD;  Location: [facility] OR;  Service: Orthopedics;  Laterality: Right;  . Joint replacement    . Cardiac catheterization  2009  . Cardiac catheterization      Per medical history from dated 06/13/10.    Family History  Problem Relation Age of Onset  . Cancer Mother     breast cancer - per medical history form dated 06/13/10.  . Diverticulitis Mother   . Hypertension Mother   . Cancer Father     skin - per medical history form dated 06/13/10.  Marland Kitchen Hypertension Father   . Anesthesia problems Neg Hx   . Hypotension Neg Hx   . Malignant hyperthermia Neg Hx   . Pseudochol deficiency Neg Hx     History  Substance Use Topics  . Smoking status: Current Every Day Smoker -- 0.25 packs/day for 18 years  . Smokeless tobacco: Not on file     Comment: Has been wearing patch for 20 days  . Alcohol Use: Yes  Comment: Occasional      Review of Systems  Constitutional: Positive for chills and fatigue. Negative for fever (subjective).  HENT: Positive for rhinorrhea. Negative for sore throat and sinus pressure.   Respiratory: Positive for cough and shortness of breath. Negative for wheezing and stridor.   Cardiovascular: Positive for chest pain. Negative for palpitations and leg swelling.  Gastrointestinal: Positive for nausea. Negative for vomiting, abdominal pain and diarrhea.  Genitourinary: Positive for dysuria and frequency.  Musculoskeletal: Positive for myalgias. Negative for back pain.  Skin: Negative for pallor, rash and wound.  Neurological: Negative for dizziness, weakness, light-headedness, numbness and headaches.  All other systems reviewed and are negative.    Allergies  Review of patient's allergies indicates no known allergies.  Home Medications   Current Outpatient Rx  Name   Route  Sig  Dispense  Refill  . acetaminophen (TYLENOL) 500 MG tablet   Oral   Take 1,000 mg by mouth 2 (two) times daily as needed.         Marland Kitchen aspirin EC 81 MG EC tablet   Oral   Take 1 tablet (81 mg total) by mouth daily.   30 tablet   1   . Calcium Carbonate-Vitamin D (CALCIUM 600 + D PO)   Oral   Take 1 tablet by mouth 2 (two) times daily.         Marland Kitchen levothyroxine (SYNTHROID, LEVOTHROID) 200 MCG tablet   Oral   Take 200 mcg by mouth daily. Take with 50 mcg to equal 250 mcg daily.         . nitroGLYCERIN (NITROSTAT) 0.4 MG SL tablet   Sublingual   Place 1 tablet (0.4 mg total) under the tongue every 5 (five) minutes x 3 doses as needed for chest pain.   30 tablet   0   . oxyCODONE-acetaminophen (PERCOCET/ROXICET) 5-325 MG per tablet   Oral   Take 1-2 tablets by mouth every 6 (six) hours as needed for pain.   10 tablet   0   . potassium chloride SA (K-DUR,KLOR-CON) 20 MEQ tablet   Oral   Take 20 mEq by mouth daily.          Marland Kitchen zolpidem (AMBIEN) 10 MG tablet   Oral   Take 10 mg by mouth at bedtime.          Marland Kitchen amLODipine (NORVASC) 5 MG tablet   Oral   Take 1 tablet (5 mg total) by mouth daily.   30 tablet   1   . furosemide (LASIX) 20 MG tablet   Oral   Take 1 tablet (20 mg total) by mouth daily.   30 tablet   0   . levothyroxine (SYNTHROID, LEVOTHROID) 50 MCG tablet   Oral   Take 50 mcg by mouth daily. Take with 200 mcg tablet to equal 250 mcg daily.           BP 164/104  Pulse 86  Temp(Src) 98.3 F (36.8 C) (Oral)  Resp 16  Ht 6\' 1"  (1.854 m)  Wt 245 lb (111.131 kg)  BMI 32.33 kg/m2  SpO2 98%  Physical Exam  Nursing note and vitals reviewed. Constitutional: He is oriented to person, place, and time. He appears well-developed and well-nourished. No distress.  HENT:  Head: Normocephalic and atraumatic.  Mouth/Throat: No oropharyngeal exudate.  No sinus tenderness  Eyes: EOM are normal. Pupils are equal, round, and reactive to light.   Neck: Normal range of motion. Neck supple.  Cardiovascular:  Normal rate, regular rhythm and normal heart sounds.   No murmur heard. Pulmonary/Chest: Effort normal and breath sounds normal. No respiratory distress. He has no wheezes. He has no rales. He exhibits tenderness (TTP over parasternal area L>R. No crepitance or deformity).  Abdominal: Soft. He exhibits no distension and no mass. There is no tenderness. There is no rebound and no guarding.  Musculoskeletal: Normal range of motion. He exhibits no edema and no tenderness.  No calf tenderness or swelling.  Neurological: He is alert and oriented to person, place, and time.  Skin: Skin is warm.  Psychiatric: He has a normal mood and affect. His behavior is normal.    ED Course  Procedures (including critical care time) DIAGNOSTIC STUDIES: Oxygen Saturation is 100% on room air, normal by my interpretation.   No O2 taken. COORDINATION OF CARE: 11:44 PM- Patient informed of clinical course, understands medical decision-making process, and agrees with plan.  Labs Reviewed  CBC WITH DIFFERENTIAL - Abnormal; Notable for the following:    Hemoglobin 17.2 (*)    Neutrophils Relative 88 (*)    Neutro Abs 8.4 (*)    Lymphocytes Relative 10 (*)    Monocytes Relative 2 (*)    All other components within normal limits  COMPREHENSIVE METABOLIC PANEL - Abnormal; Notable for the following:    Glucose, Bld 202 (*)    AST 39 (*)    ALT 55 (*)    GFR calc non Af Amer 80 (*)    All other components within normal limits  URINALYSIS, ROUTINE W REFLEX MICROSCOPIC - Abnormal; Notable for the following:    Glucose, UA 100 (*)    Ketones, ur 15 (*)    All other components within normal limits  TROPONIN I  PRO B NATRIURETIC PEPTIDE   Dg Chest 2 View  08/10/2012  *RADIOLOGY REPORT*  Clinical Data: Shortness of breath.  Chest pain.  History of smoking.  CHEST - 2 VIEW  Comparison: 12/07/2011  Findings: The heart is enlarged.  There is mild pulmonary  edema. There are no focal consolidations or pleural effusions. Visualized osseous structures have a normal appearance.  IMPRESSION: Cardiomegaly and mild pulmonary edema.   Original Report Authenticated By: Norva Pavlov, M.D.      1. Pulmonary edema   2. Sleep apnea   3. Elevated liver enzymes   4. Chest wall pain      Date: 08/10/2012  Rate: 91  Rhythm: normal sinus rhythm  QRS Axis: normal  Intervals: normal  ST/T Wave abnormalities: normal  Conduction Disutrbances:none  Narrative Interpretation:   Old EKG Reviewed: unchanged    MDM  Given recent cath without significant stenosis and reproducibility of chest pain low suspicion for CAD. Normal EKG and trop.   On further questioning, pt c/o snoring, wakes frequently during the night, nocturia, decreased sexual interest, daytime fatigue, headaches. Given increased LFT's and pulmonary edema, believe symptoms related to sleep apnea. Pt strongly encouraged to f/u with PMD for sleep study. Will start on low dose lasix to be managed by PMD and d/c'd as indicated.   Loren Racer, MD 08/10/12 [phone]

## 2012-08-10 LAB — URINALYSIS, ROUTINE W REFLEX MICROSCOPIC
Bilirubin Urine: NEGATIVE
Glucose, UA: 100 mg/dL — AB
Hgb urine dipstick: NEGATIVE
Ketones, ur: 15 mg/dL — AB
Leukocytes, UA: NEGATIVE
Nitrite: NEGATIVE
Protein, ur: NEGATIVE mg/dL
Specific Gravity, Urine: 1.01 (ref 1.005–1.030)
Urobilinogen, UA: 0.2 mg/dL (ref 0.0–1.0)
pH: 7 (ref 5.0–8.0)

## 2012-08-10 LAB — COMPREHENSIVE METABOLIC PANEL
ALT: 55 U/L — ABNORMAL HIGH (ref 0–53)
AST: 39 U/L — ABNORMAL HIGH (ref 0–37)
Albumin: 4.3 g/dL (ref 3.5–5.2)
Alkaline Phosphatase: 89 U/L (ref 39–117)
BUN: 14 mg/dL (ref 6–23)
CO2: 21 mEq/L (ref 19–32)
Calcium: 9.8 mg/dL (ref 8.4–10.5)
Chloride: 100 mEq/L (ref 96–112)
Creatinine, Ser: 1.09 mg/dL (ref 0.50–1.35)
GFR calc Af Amer: >90 mL/min (ref >90)
GFR calc non Af Amer: 80 mL/min — ABNORMAL LOW (ref >90)
Glucose, Bld: 202 mg/dL — ABNORMAL HIGH (ref 70–99)
Potassium: 4.2 mEq/L (ref 3.5–5.1)
Sodium: 136 mEq/L (ref 135–145)
Total Bilirubin: 0.3 mg/dL (ref 0.3–1.2)
Total Protein: 7.8 g/dL (ref 6.0–8.3)

## 2012-08-10 LAB — CBC WITH DIFFERENTIAL/PLATELET
Basophils Absolute: 0 10*3/uL (ref 0.0–0.1)
Basophils Relative: 0 % (ref 0–1)
Eosinophils Absolute: 0 10*3/uL (ref 0.0–0.7)
Eosinophils Relative: 0 % (ref 0–5)
HCT: 47.9 % (ref 39.0–52.0)
Hemoglobin: 17.2 g/dL — ABNORMAL HIGH (ref 13.0–17.0)
Lymphocytes Relative: 10 % — ABNORMAL LOW (ref 12–46)
Lymphs Abs: 0.9 10*3/uL (ref 0.7–4.0)
MCH: 33.3 pg (ref 26.0–34.0)
MCHC: 35.9 g/dL (ref 30.0–36.0)
MCV: 92.8 fL (ref 78.0–100.0)
Monocytes Absolute: 0.2 10*3/uL (ref 0.1–1.0)
Monocytes Relative: 2 % — ABNORMAL LOW (ref 3–12)
Neutro Abs: 8.4 10*3/uL — ABNORMAL HIGH (ref 1.7–7.7)
Neutrophils Relative %: 88 % — ABNORMAL HIGH (ref 43–77)
Platelets: 253 10*3/uL (ref 150–400)
RBC: 5.16 MIL/uL (ref 4.22–5.81)
RDW: 12.9 % (ref 11.5–15.5)
WBC: 9.5 10*3/uL (ref 4.0–10.5)

## 2012-08-10 LAB — PRO B NATRIURETIC PEPTIDE: Pro B Natriuretic peptide (BNP): 10.9 pg/mL (ref 0–125)

## 2012-08-10 LAB — TROPONIN I: Troponin I: <0.3 ng/mL (ref <0.30)

## 2012-08-10 MED ORDER — NITROGLYCERIN 0.4 MG SL SUBL
0.4000 mg | SUBLINGUAL_TABLET | SUBLINGUAL | Status: DC | PRN
  Administered 2012-08-10: 0.4 mg via SUBLINGUAL
  Filled 2012-08-10: qty 25

## 2012-08-10 MED ORDER — FUROSEMIDE 20 MG PO TABS: 20.0000 mg | ORAL_TABLET | Freq: Every day | ORAL | Status: DC

## 2012-08-10 MED ORDER — FUROSEMIDE 40 MG PO TABS
40.0000 mg | ORAL_TABLET | Freq: Once | ORAL | Status: AC
  Administered 2012-08-10: 40 mg via ORAL
  Filled 2012-08-10: qty 1

## 2012-08-10 NOTE — ED Notes (Signed)
Pt states he does not want to take the additional nitro tablets.

## 2012-08-10 NOTE — ED Notes (Signed)
BP 141/92

## 2012-08-10 NOTE — ED Notes (Signed)
MD at bedside. 

## 2012-08-10 NOTE — ED Notes (Signed)
Pt states pain remains 6 out of 10. BP 124/87 States he does not believe it to be "chest pain, it hurts when I breath".

## 2012-08-29 ENCOUNTER — Other Ambulatory Visit (HOSPITAL_COMMUNITY): Payer: Self-pay | Admitting: Cardiovascular Disease

## 2012-08-29 DIAGNOSIS — R079 Chest pain, unspecified: Secondary | ICD-10-CM

## 2012-08-29 DIAGNOSIS — R0602 Shortness of breath: Secondary | ICD-10-CM

## 2012-08-29 DIAGNOSIS — I10 Essential (primary) hypertension: Secondary | ICD-10-CM

## 2012-09-17 ENCOUNTER — Ambulatory Visit (HOSPITAL_COMMUNITY): Payer: 59

## 2012-09-17 ENCOUNTER — Encounter (HOSPITAL_COMMUNITY): Payer: 59

## 2012-09-24 ENCOUNTER — Ambulatory Visit (HOSPITAL_COMMUNITY)
Admission: RE | Admit: 2012-09-24 | Discharge: 2012-09-24 | Disposition: A | Discharge status: Home / Self Care | Payer: 59 | Source: Ambulatory Visit | Attending: Cardiovascular Disease | Admitting: Cardiovascular Disease

## 2012-09-24 DIAGNOSIS — R079 Chest pain, unspecified: Secondary | ICD-10-CM

## 2012-09-24 DIAGNOSIS — I10 Essential (primary) hypertension: Secondary | ICD-10-CM

## 2012-09-24 DIAGNOSIS — I2510 Atherosclerotic heart disease of native coronary artery without angina pectoris: Secondary | ICD-10-CM | POA: Insufficient documentation

## 2012-09-24 DIAGNOSIS — I079 Rheumatic tricuspid valve disease, unspecified: Secondary | ICD-10-CM | POA: Insufficient documentation

## 2012-09-24 DIAGNOSIS — R0602 Shortness of breath: Secondary | ICD-10-CM | POA: Insufficient documentation

## 2012-09-24 DIAGNOSIS — E785 Hyperlipidemia, unspecified: Secondary | ICD-10-CM | POA: Insufficient documentation

## 2012-09-24 DIAGNOSIS — I359 Nonrheumatic aortic valve disorder, unspecified: Secondary | ICD-10-CM | POA: Insufficient documentation

## 2012-09-24 DIAGNOSIS — I059 Rheumatic mitral valve disease, unspecified: Secondary | ICD-10-CM | POA: Insufficient documentation

## 2012-09-24 MED ORDER — TECHNETIUM TC 99M SESTAMIBI GENERIC - CARDIOLITE
31.0000 | Freq: Once | INTRAVENOUS | Status: AC | PRN
  Administered 2012-09-24: 31 via INTRAVENOUS

## 2012-09-24 MED ORDER — TECHNETIUM TC 99M SESTAMIBI GENERIC - CARDIOLITE
11.0000 | Freq: Once | INTRAVENOUS | Status: AC | PRN
  Administered 2012-09-24: 11 via INTRAVENOUS

## 2012-09-24 MED ORDER — REGADENOSON 0.4 MG/5ML IV SOLN
0.4000 mg | Freq: Once | INTRAVENOUS | Status: AC
  Administered 2012-09-24: 0.4 mg via INTRAVENOUS

## 2012-09-24 NOTE — Progress Notes (Signed)
[facility]   2D echo completed 09/24/2012.   Cindy Melizza Kanode, RDCS  

## 2012-09-24 NOTE — Procedures (Addendum)
[facility] [address] [place] [address] [place] [address] [phone]  Cardiology Nuclear Med Study  Andrew Love is a 47 y.o. male     MRN : [id number]     DOB: 20-Jun-1965  Procedure Date: 09/24/2012  Nuclear Med Background Indication for Stress Test:  Post Hospital History:  CAD;CATH IN 2009 Cardiac Risk Factors: Family History - CAD, Hypertension, Lipids, Obesity and Smoker  Symptoms:  Chest Pain, Dizziness, DOE, Fatigue, Light-Headedness, Palpitations and SOB   Nuclear Pre-Procedure Caffeine/Decaff Intake:  7:00pm NPO After: 5:00am   IV Site: R Antecubital  IV 0.9% NS with Angio Cath:  22g  Chest Size (in):  46"  IV Started by: Emmit Pomfret, RN  Height: 6' (1.829 m)  Cup Size: n/a  BMI:  Body mass index is 32.41 kg/(m^2). Weight:  239 lb (108.41 kg)   Tech Comments:  N/A    Nuclear Med Study 1 or 2 day study: 1 day  Stress Test Type:  Lexiscan  Order Authorizing Provider:  Susa Griffins, MD   Resting Radionuclide: Technetium 59m Sestamibi  Resting Radionuclide Dose: 11.0 mCi   Stress Radionuclide:  Technetium 78m Sestamibi  Stress Radionuclide Dose: 31.0 mCi           Stress Protocol Rest HR: 53 Stress HR: 105  Rest BP: 150/78 Stress BP: 169/82  Exercise Time (min): n/a METS: n/a   Predicted Max HR: 174 bpm % Max HR: 60.34 bpm Rate Pressure Product: 46962  Dose of Adenosine (mg):  n/a Dose of Lexiscan: 0.4 mg  Dose of Atropine (mg): n/a Dose of Dobutamine: n/a mcg/kg/min (at max HR)  Stress Test Technologist: Esperanza Sheets, CCT Nuclear Technologist: Gonzella Lex, CNMT   Rest Procedure:  Myocardial perfusion imaging was performed at rest 45 minutes following the intravenous administration of Technetium 13m Sestamibi. Stress Procedure:  The patient received IV Lexiscan 0.4 mg over 15-seconds.  Technetium 55m Sestamibi injected at 30-seconds.  There were no significant changes with Lexiscan.  Quantitative  spect images were obtained after a 45 minute delay.  Transient Ischemic Dilatation (Normal <1.22): 0.98 Lung/Heart Ratio (Normal <0.45):  0.36 QGS EDV:  112 ml QGS ESV:  50 ml LV Ejection Fraction: 55%  Rest ECG: Sinus bradycardia at 53  Stress ECG: No significant change from baseline ECG  QPS Raw Data Images:  Normal; no motion artifact; normal heart/lung ratio. Stress Images:  Normal homogeneous uptake in all areas of the myocardium. Rest Images:  Normal homogeneous uptake in all areas of the myocardium. Subtraction (SDS):  No evidence of ischemia.  Impression Exercise Capacity:  Lexiscan with no exercise. BP Response:  Normal blood pressure response. Clinical Symptoms:  No significant symptoms noted. ECG Impression:  No significant ECG changes with Lexiscan. Comparison with Prior Nuclear Study: No significant change from previous study in 2009  Overall Impression:  Normal stress nuclear study.  LV Wall Motion:  NL LV Function; NL Wall Motion; EF 55%  Chrystie Nose, MD, [facility] Board Certified in Nuclear Cardiology Attending Cardiologist [facility]  Chrystie Nose, MD  09/24/2012 6:14 PM

## 2012-11-15 ENCOUNTER — Emergency Department (HOSPITAL_COMMUNITY)
Admission: EM | Admit: 2012-11-15 | Discharge: 2012-11-15 | Disposition: A | Discharge status: Home / Self Care | Payer: 59 | Attending: Emergency Medicine | Admitting: Emergency Medicine

## 2012-11-15 ENCOUNTER — Encounter (HOSPITAL_COMMUNITY): Payer: Self-pay | Admitting: *Deleted

## 2012-11-15 ENCOUNTER — Emergency Department (HOSPITAL_COMMUNITY): Payer: 59

## 2012-11-15 DIAGNOSIS — K529 Noninfective gastroenteritis and colitis, unspecified: Secondary | ICD-10-CM

## 2012-11-15 DIAGNOSIS — I2510 Atherosclerotic heart disease of native coronary artery without angina pectoris: Secondary | ICD-10-CM | POA: Insufficient documentation

## 2012-11-15 DIAGNOSIS — Z79899 Other long term (current) drug therapy: Secondary | ICD-10-CM | POA: Insufficient documentation

## 2012-11-15 DIAGNOSIS — G8929 Other chronic pain: Secondary | ICD-10-CM | POA: Insufficient documentation

## 2012-11-15 DIAGNOSIS — K5289 Other specified noninfective gastroenteritis and colitis: Secondary | ICD-10-CM | POA: Insufficient documentation

## 2012-11-15 DIAGNOSIS — I10 Essential (primary) hypertension: Secondary | ICD-10-CM | POA: Insufficient documentation

## 2012-11-15 DIAGNOSIS — Z8719 Personal history of other diseases of the digestive system: Secondary | ICD-10-CM | POA: Insufficient documentation

## 2012-11-15 DIAGNOSIS — G4700 Insomnia, unspecified: Secondary | ICD-10-CM | POA: Insufficient documentation

## 2012-11-15 DIAGNOSIS — F17200 Nicotine dependence, unspecified, uncomplicated: Secondary | ICD-10-CM | POA: Insufficient documentation

## 2012-11-15 DIAGNOSIS — M1990 Unspecified osteoarthritis, unspecified site: Secondary | ICD-10-CM | POA: Insufficient documentation

## 2012-11-15 DIAGNOSIS — M549 Dorsalgia, unspecified: Secondary | ICD-10-CM | POA: Insufficient documentation

## 2012-11-15 DIAGNOSIS — E079 Disorder of thyroid, unspecified: Secondary | ICD-10-CM | POA: Insufficient documentation

## 2012-11-15 DIAGNOSIS — Z872 Personal history of diseases of the skin and subcutaneous tissue: Secondary | ICD-10-CM | POA: Insufficient documentation

## 2012-11-15 DIAGNOSIS — Z8709 Personal history of other diseases of the respiratory system: Secondary | ICD-10-CM | POA: Insufficient documentation

## 2012-11-15 LAB — URINALYSIS, ROUTINE W REFLEX MICROSCOPIC
Bilirubin Urine: NEGATIVE
Glucose, UA: NEGATIVE mg/dL
Hgb urine dipstick: NEGATIVE
Ketones, ur: NEGATIVE mg/dL
Leukocytes, UA: NEGATIVE
Nitrite: NEGATIVE
Protein, ur: NEGATIVE mg/dL
Specific Gravity, Urine: 1.02 (ref 1.005–1.030)
Urobilinogen, UA: 0.2 mg/dL (ref 0.0–1.0)
pH: 6 (ref 5.0–8.0)

## 2012-11-15 LAB — COMPREHENSIVE METABOLIC PANEL
ALT: 56 U/L — ABNORMAL HIGH (ref 0–53)
AST: 32 U/L (ref 0–37)
Albumin: 4.1 g/dL (ref 3.5–5.2)
Alkaline Phosphatase: 85 U/L (ref 39–117)
BUN: 13 mg/dL (ref 6–23)
CO2: 24 mEq/L (ref 19–32)
Calcium: 9.4 mg/dL (ref 8.4–10.5)
Chloride: 100 mEq/L (ref 96–112)
Creatinine, Ser: 1.06 mg/dL (ref 0.50–1.35)
GFR calc Af Amer: >90 mL/min (ref >90)
GFR calc non Af Amer: 82 mL/min — ABNORMAL LOW (ref >90)
Glucose, Bld: 109 mg/dL — ABNORMAL HIGH (ref 70–99)
Potassium: 3.9 mEq/L (ref 3.5–5.1)
Sodium: 136 mEq/L (ref 135–145)
Total Bilirubin: 0.4 mg/dL (ref 0.3–1.2)
Total Protein: 7.8 g/dL (ref 6.0–8.3)

## 2012-11-15 LAB — CBC WITH DIFFERENTIAL/PLATELET
Basophils Absolute: 0.1 10*3/uL (ref 0.0–0.1)
Basophils Relative: 1 % (ref 0–1)
Eosinophils Absolute: 0.2 10*3/uL (ref 0.0–0.7)
Eosinophils Relative: 2 % (ref 0–5)
HCT: 45.9 % (ref 39.0–52.0)
Hemoglobin: 16.1 g/dL (ref 13.0–17.0)
Lymphocytes Relative: 26 % (ref 12–46)
Lymphs Abs: 2.7 10*3/uL (ref 0.7–4.0)
MCH: 32.6 pg (ref 26.0–34.0)
MCHC: 35.1 g/dL (ref 30.0–36.0)
MCV: 92.9 fL (ref 78.0–100.0)
Monocytes Absolute: 0.9 10*3/uL (ref 0.1–1.0)
Monocytes Relative: 8 % (ref 3–12)
Neutro Abs: 6.6 10*3/uL (ref 1.7–7.7)
Neutrophils Relative %: 63 % (ref 43–77)
Platelets: 254 10*3/uL (ref 150–400)
RBC: 4.94 MIL/uL (ref 4.22–5.81)
RDW: 12.6 % (ref 11.5–15.5)
WBC: 10.5 10*3/uL (ref 4.0–10.5)

## 2012-11-15 LAB — LIPASE, BLOOD: Lipase: 15 U/L (ref 11–59)

## 2012-11-15 MED ORDER — HYDROCODONE-ACETAMINOPHEN 5-325 MG PO TABS: 1.0000 | ORAL_TABLET | Freq: Four times a day (QID) | ORAL | Status: DC | PRN

## 2012-11-15 MED ORDER — ONDANSETRON HCL 4 MG/2ML IJ SOLN
4.0000 mg | INTRAMUSCULAR | Status: AC
  Administered 2012-11-15: 4 mg via INTRAVENOUS
  Filled 2012-11-15: qty 2

## 2012-11-15 MED ORDER — MORPHINE SULFATE 4 MG/ML IJ SOLN
4.0000 mg | Freq: Once | INTRAMUSCULAR | Status: AC
  Administered 2012-11-15: 4 mg via INTRAVENOUS
  Filled 2012-11-15: qty 1

## 2012-11-15 MED ORDER — SODIUM CHLORIDE 0.9 % IV BOLUS (SEPSIS)
1000.0000 mL | Freq: Once | INTRAVENOUS | Status: AC
  Administered 2012-11-15: 1000 mL via INTRAVENOUS

## 2012-11-15 MED ORDER — METRONIDAZOLE IN NACL 5-0.79 MG/ML-% IV SOLN
500.0000 mg | Freq: Once | INTRAVENOUS | Status: AC
  Administered 2012-11-15: 500 mg via INTRAVENOUS
  Filled 2012-11-15: qty 100

## 2012-11-15 MED ORDER — IOHEXOL 300 MG/ML  SOLN
50.0000 mL | Freq: Once | INTRAMUSCULAR | Status: AC | PRN
  Administered 2012-11-15: 50 mL via ORAL

## 2012-11-15 MED ORDER — CIPROFLOXACIN HCL 500 MG PO TABS: 500.0000 mg | ORAL_TABLET | Freq: Two times a day (BID) | ORAL | Status: DC

## 2012-11-15 MED ORDER — CIPROFLOXACIN IN D5W 400 MG/200ML IV SOLN
400.0000 mg | Freq: Once | INTRAVENOUS | Status: AC
  Administered 2012-11-15: 400 mg via INTRAVENOUS
  Filled 2012-11-15 (×2): qty 200

## 2012-11-15 MED ORDER — IOHEXOL 300 MG/ML  SOLN
100.0000 mL | Freq: Once | INTRAMUSCULAR | Status: AC | PRN
  Administered 2012-11-15: 100 mL via INTRAVENOUS

## 2012-11-15 MED ORDER — ONDANSETRON 4 MG PO TBDP: 4.0000 mg | ORAL_TABLET | Freq: Three times a day (TID) | ORAL | Status: DC | PRN

## 2012-11-15 MED ORDER — METRONIDAZOLE 500 MG PO TABS: 500.0000 mg | ORAL_TABLET | Freq: Two times a day (BID) | ORAL | Status: DC

## 2012-11-15 NOTE — ED Provider Notes (Signed)
History    CSN: [id number] Arrival date & time 11/15/12  0214  First MD Initiated Contact with Patient 11/15/12 0220     Chief Complaint  Patient presents with  . Abdominal Pain   (Consider location/radiation/quality/duration/timing/severity/associated sxs/prior Treatment) HPI Comments: Abdominal pain x 3 weeks, worsening 2 days ago. Pain is sharp and constant, radiating to periumbilical region from the LLQ and without alleviating factors. Pain aggravated when leaning forward. He admits to associated diaphoresis and chills, nausea, and NB/NB vomiting. Also endorses 5 episodes of watery diarrhea, intermittently streaked with bright red blood. Patient was seen 2 years ago for diverticulitis and colitis and subsequent colectomy. States that this pain feels the same.  Patient is a 47 y.o. male presenting with abdominal pain. The history is provided by the patient. No language interpreter was used.  Abdominal Pain Associated symptoms include abdominal pain, nausea and vomiting. Pertinent negatives include no chest pain, fever, numbness or weakness.   Past Medical History  Diagnosis Date  . CAD (coronary artery disease)   . Elevated liver enzymes   . Diverticulitis     Hx of; requiring 3 admissions  . Sigmoid colon ulcer     Rectal polyps  . Thyroid disease   . Psoriasis     Per medical history form dated 06/13/10.  . Chronic insomnia     Per medical history form dated 06/13/10.  . Colitis     Per medical history from dated 06/13/10.  Marland Kitchen Hypertension   . Hypothyroidism   . Bronchitis     history of  . Back pain     chronic  . Arthritis   . 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 Colonoscopy  07/2008    Colitis,NSAID v. Ischemia,malrotation of the gut,Diverticulosis(L),hyperplastic  . Appendectomy    . Shoulder surgery      Left  . Rotator cuff repair      Left - per medical history form dated 06/13/10.  .  Thyroidectomy      Per medical history form dated 06/13/10.  . Colon surgery  2012  . Total hip arthroplasty  07/04/2011    Procedure: TOTAL HIP ARTHROPLASTY;  Surgeon: Eulas Post, MD;  Location: [facility] OR;  Service: Orthopedics;  Laterality: Right;  . Joint replacement    . Cardiac catheterization  2009  . Cardiac catheterization      Per medical history from dated 06/13/10.   Family History  Problem Relation Age of Onset  . Cancer Mother     breast cancer - per medical history form dated 06/13/10.  . Diverticulitis Mother   . Hypertension Mother   . Cancer Father     skin - per medical history form dated 06/13/10.  Marland Kitchen Hypertension Father   . Anesthesia problems Neg Hx   . Hypotension Neg Hx   . Malignant hyperthermia Neg Hx   . Pseudochol deficiency Neg Hx    History  Substance Use Topics  . Smoking status: Current Every Day Smoker -- 0.25 packs/day for 18 years  . Smokeless tobacco: Not on file     Comment: Has been wearing patch for 20 days  . Alcohol Use: Yes     Comment: Occasional    Review of Systems  Constitutional: Negative for fever.  Respiratory: Negative for shortness of breath.   Cardiovascular: Negative for chest pain.  Gastrointestinal: Positive for nausea, vomiting, abdominal pain, diarrhea and blood in stool.  Genitourinary: Negative for dysuria and hematuria.  Neurological: Negative for weakness and numbness.  All other systems reviewed and are negative.    Allergies  Review of patient's allergies indicates no known allergies.  Home Medications   Current Outpatient Rx  Name  Route  Sig  Dispense  Refill  . Calcium Carbonate-Vitamin D (CALCIUM 600 + D PO)   Oral   Take 1 tablet by mouth 2 (two) times daily.         Marland Kitchen ibuprofen (ADVIL,MOTRIN) 200 MG tablet   Oral   Take 600 mg by mouth every 6 (six) hours as needed for pain.         Marland Kitchen levothyroxine (SYNTHROID, LEVOTHROID) 200 MCG tablet   Oral   Take 200 mcg by mouth daily. Take with 50 mcg to  equal 250 mcg daily.         Marland Kitchen levothyroxine (SYNTHROID, LEVOTHROID) 50 MCG tablet   Oral   Take 50 mcg by mouth daily. Take with 200 mcg tablet to equal 250 mcg daily.         . nitroGLYCERIN (NITROSTAT) 0.4 MG SL tablet   Sublingual   Place 1 tablet (0.4 mg total) under the tongue every 5 (five) minutes x 3 doses as needed for chest pain.   30 tablet   0   . olmesartan (BENICAR) 20 MG tablet   Oral   Take 20 mg by mouth daily.         Marland Kitchen zolpidem (AMBIEN) 10 MG tablet   Oral   Take 10 mg by mouth at bedtime.          . ciprofloxacin (CIPRO) 500 MG tablet   Oral   Take 1 tablet (500 mg total) by mouth every 12 (twelve) hours.   20 tablet   0   . HYDROcodone-acetaminophen (NORCO/VICODIN) 5-325 MG per tablet   Oral   Take 1-2 tablets by mouth every 6 (six) hours as needed for pain.   20 tablet   0   . metroNIDAZOLE (FLAGYL) 500 MG tablet   Oral   Take 1 tablet (500 mg total) by mouth 2 (two) times daily.   20 tablet   0   . ondansetron (ZOFRAN-ODT) 4 MG disintegrating tablet   Oral   Take 1 tablet (4 mg total) by mouth every 8 (eight) hours as needed for nausea.   20 tablet   0    BP 157/91  Pulse 59  Temp(Src) 97.9 F (36.6 C) (Oral)  Resp 18  SpO2 97% Physical Exam  Nursing note and vitals reviewed. Constitutional: He is oriented to person, place, and time. He appears well-developed and well-nourished. No distress.  HENT:  Head: Normocephalic and atraumatic.  Mouth/Throat: Oropharynx is clear and moist. No oropharyngeal exudate.  Eyes: Conjunctivae and EOM are normal. Pupils are equal, round, and reactive to light. No scleral icterus.  Neck: Normal range of motion.  Cardiovascular: Normal rate, regular rhythm and normal heart sounds.   Pulmonary/Chest: Effort normal and breath sounds normal. No respiratory distress. He has no wheezes. He has no rales.  Abdominal: Normal appearance and bowel sounds are normal. He exhibits no pulsatile midline mass.  There is tenderness in the left lower quadrant. There is guarding (involuntary). There is no rigidity, no tenderness at McBurney's point and negative Murphy's sign.    ++TTP in LLQ with voluntary guarding. No peritoneal signs.  Musculoskeletal: Normal range of motion. He exhibits no edema.  Neurological: He is alert and  oriented to person, place, and time.  Skin: Skin is warm and dry. No rash noted. He is not diaphoretic. No erythema. No pallor.  Psychiatric: He has a normal mood and affect. His behavior is normal.    ED Course  Procedures (including critical care time) Labs Reviewed  COMPREHENSIVE METABOLIC PANEL - Abnormal; Notable for the following:    Glucose, Bld 109 (*)    ALT 56 (*)    GFR calc non Af Amer 82 (*)    All other components within normal limits  CBC WITH DIFFERENTIAL  LIPASE, BLOOD  URINALYSIS, ROUTINE W REFLEX MICROSCOPIC   Ct Abdomen Pelvis W Contrast  11/15/2012   *RADIOLOGY REPORT*  Clinical Data: Abdominal pain, nausea, vomiting, and diarrhea for 2 weeks, increasing in the last 2 days.  White cell count 10.5.  CT ABDOMEN AND PELVIS WITH CONTRAST  Technique:  Multidetector CT imaging of the abdomen and pelvis was performed following the standard protocol during bolus administration of intravenous contrast.  Contrast: OMNIPAQUE IOHEXOL 300 MG/ML  SOLN  Comparison: 82,013  Findings: The lung bases are clear.  The liver, spleen, gallbladder, pancreas, adrenal glands, kidneys, abdominal aorta, inferior vena cava, and retroperitoneal lymph nodes are unremarkable.  The stomach, small bowel, and colon are not abnormally distended.  Stool fills the colon.  There is suggestion of focal wall thickening along the descending colon with pericolonic stranding consistent with focal colitis.  This could represent diverticulitis or other focal inflammatory/infectious process.  No free air or free fluid in the abdomen.  Prominent visceral adipose tissues.  Pelvis:  Low pelvis is  partially obscured due to streak artifact arising from right hip prosthesis.  The prostate gland is prominent.  Calcification in the seminal vesicles.  Bladder wall is not thickened.  Rectosigmoid colon is stool-filled without inflammatory change.  The cecum is located in the left abdomen consistent with congenital nonrotation or postoperative change. The appendix is not identified.  No free or loculated pelvic fluid collections.  No significant pelvic lymphadenopathy.  Normal alignment of the lumbar spine.  IMPRESSION: Focal colitis involving the descending colon.  Etiology is nonspecific.   Original Report Authenticated By: Burman Nieves, M.D.   1. Colitis, acute    MDM  Patient presents for lower quadrant abdominal pain, worsening x2 weeks. Physical exam as above, with focal tenderness in the left lower quadrant and both voluntary and involuntary guarding. Urine without evidence of infection. There is no leukocytosis, anemia, hemoconcentration, or electrolyte imbalance. Liver and kidney function preserved. Lipase within normal limits. CT abdomen and pelvis with contrast ordered which was significant for colitis of the descending colon. Given labs, ability to tolerate PO contrast without emesis, and the fact that the patient has remained afebrile and hemodynamically stable, anticipate discharge with gastroenterology and primary care followup. Cipro and Flagyl ordered to be given IV prior to discharge. Patient's pain well controlled with IV morphine and nausea well controlled with Zofran.  IV abx completed; pain improved. Patient stable for d/c with appropriate follow up. Rx for cipro and flagyl given. Indications for ED return dicussed with patient who is agreeable to this d/c plan.  Antony Madura, PA-C 11/19/12 1625

## 2012-11-15 NOTE — ED Notes (Signed)
Pt states unable to urinate at this time. 

## 2012-11-15 NOTE — ED Notes (Signed)
abd pain x 2 wks; worse yesterday; nausea/vomiting/diarrhea; unsure of fever; history of diverticulitis; feels like same

## 2012-11-20 ENCOUNTER — Ambulatory Visit (INDEPENDENT_AMBULATORY_CARE_PROVIDER_SITE_OTHER): Payer: 59 | Admitting: Gastroenterology

## 2012-11-20 ENCOUNTER — Encounter: Payer: Self-pay | Admitting: Gastroenterology

## 2012-11-20 ENCOUNTER — Encounter (HOSPITAL_COMMUNITY): Payer: Self-pay | Admitting: Pharmacy Technician

## 2012-11-20 VITALS — BP 140/90 | HR 85 | Temp 98.2°F | Ht 73.0 in | Wt 236.0 lb

## 2012-11-20 DIAGNOSIS — R197 Diarrhea, unspecified: Secondary | ICD-10-CM

## 2012-11-20 DIAGNOSIS — K5289 Other specified noninfective gastroenteritis and colitis: Secondary | ICD-10-CM

## 2012-11-20 DIAGNOSIS — K625 Hemorrhage of anus and rectum: Secondary | ICD-10-CM

## 2012-11-20 DIAGNOSIS — Z8719 Personal history of other diseases of the digestive system: Secondary | ICD-10-CM

## 2012-11-20 DIAGNOSIS — K529 Noninfective gastroenteritis and colitis, unspecified: Secondary | ICD-10-CM

## 2012-11-20 MED ORDER — PEG-KCL-NACL-NASULF-NA ASC-C 100 G PO SOLR: 1.0000 | ORAL | Status: DC

## 2012-11-20 NOTE — Addendum Note (Signed)
Addended by: [place] on: 11/20/2012 10:14 AM   Modules accepted: Orders

## 2012-11-20 NOTE — Assessment & Plan Note (Signed)
IN THE SETTING OF COLITIS. I PERSONALLY REVIEWED CT WITH DR. Tyron Russell.PT DOES HAVE A FOCAL COLITIS (~11 CM) IN ASSOCIATION WITH A DIVERTICULA. FAVOR SEGMENTAL COLITIS. SB NL.  COMPLETE ABX TCS JUL 29 OPV IN 6 MOS

## 2012-11-20 NOTE — Assessment & Plan Note (Signed)
MOST LIKELY DUE TO INFECTION-FOOD BORNE ILLNESS, LESS LIKELY ISCHEMIC OR IBD.  TCS JUL 29

## 2012-11-20 NOTE — Progress Notes (Signed)
Cc PCP 

## 2012-11-20 NOTE — Assessment & Plan Note (Signed)
ACUTE ON CHRONIC DUE TO COLITIS. CHRONIC SX CAN BE DUE TO OVER REPLACED THYROID HORMONE.  CHECK TSH.

## 2012-11-20 NOTE — Assessment & Plan Note (Signed)
4 DOCUMENTED EPISODE-PARTIAL COLECTOMY  NO EVIDENCE FOR DIVERTICULITIS ON CT.

## 2012-11-20 NOTE — Addendum Note (Signed)
Addended by: Jennings Books on: 11/20/2012 10:38 AM   Modules accepted: Orders

## 2012-11-20 NOTE — Patient Instructions (Addendum)
COMPLETE THE CIPRO/FLAGYL.  GET YOUR BLOOD DRAWN.  TAKE A PROBIOTIC DAILY FOR THREE MONTHS ([employer] BRAND, [employer], ALIGN).   AVOID DAIRY FOR ONE MONTH. SEE HANDOUT BELOW.  COLONOSCOPY ON JUL 29.  FOLLOW UP IN 6 MOS TO 1 YEAR.    Lactose Free Diet Lactose is a carbohydrate that is found mainly in milk and milk products, as well as in foods with added milk or whey. Lactose must be digested by the enzyme in order to be used by the body. Lactose intolerance occurs when there is a shortage of lactase. When your body is not able to digest lactose, you may feel sick to your stomach (nausea), bloating, cramping, gas and diarrhea.  There are many dairy products that may be tolerated better than milk by some people:  The use of cultured dairy products such as yogurt, buttermilk, cottage cheese, and sweet acidophilus milk (Kefir) for lactase-deficient individuals is usually well tolerated. This is because the healthy bacteria help digest lactose.   Lactose-hydrolyzed milk (Lactaid) contains 40-90% less lactose than milk and may also be well tolerated.    SPECIAL NOTES  Lactose is a carbohydrates. The major food source is dairy products. Reading food labels is important. Many products contain lactose even when they are not made from milk. Look for the following words: whey, milk solids, dry milk solids, nonfat dry milk powder. Typical sources of lactose other than dairy products include breads, candies, cold cuts, prepared and processed foods, and commercial sauces and gravies.   All foods must be prepared without milk, cream, or other dairy foods.   Soy milk and lactose-free supplements (LACTASE) may be used as an alternative to milk.   FOOD GROUP ALLOWED/RECOMMENDED AVOID/USE SPARINGLY  BREADS / STARCHES 4 servings or more* Breads and rolls made without milk. [place], [place], or [place] bread. Breads and rolls that contain milk. Prepared mixes such as muffins, biscuits,  waffles, pancakes. Sweet rolls, donuts, [place] toast (if made with milk or lactose).  Crackers: Soda crackers, graham crackers. Any crackers prepared without lactose. Zwieback crackers, corn curls, or any that contain lactose.  Cereals: Cooked or dry cereals prepared without lactose (read labels). Cooked or dry cereals prepared with lactose (read labels). Total, Cocoa Krispies. Special K.  Potatoes / Pasta / Rice: Any prepared without milk or lactose. Popcorn. Instant potatoes, frozen [place] fries, scalloped or au gratin potatoes.  VEGETABLES 2 servings or more Fresh, frozen, and canned vegetables. Creamed or breaded vegetables. Vegetables in a cheese sauce or with lactose-containing margarines.  FRUIT 2 servings or more All fresh, canned, or frozen fruits that are not processed with lactose. Any canned or frozen fruits processed with lactose.  MEAT & SUBSTITUTES 2 servings or more (4 to 6 oz. total per day) Plain beef, chicken, fish, [place], lamb, veal, pork, or ham. Kosher prepared meat products. Strained or junior meats that do not contain milk. Eggs, soy meat substitutes, nuts. Scrambled eggs, omelets, and souffles that contain milk. Creamed or breaded meat, fish, or fowl. Sausage products such as wieners, liver sausage, or cold cuts that contain milk solids. Cheese, cottage cheese, or cheese spreads.  MILK None. (See "BEVERAGES" for milk substitutes. See "DESSERTS" for ice cream and frozen desserts.) Milk (whole, 2%, skim, or chocolate). Evaporated, powdered, or condensed milk; malted milk.  SOUPS & COMBINATION FOODS Bouillon, broth, vegetable soups, clear soups, consomms. Homemade soups made with allowed ingredients. Combination or prepared foods that do not contain milk or milk products (read labels). Cream  soups, chowders, commercially prepared soups containing lactose. Macaroni and cheese, pizza. Combination or prepared foods that contain milk or milk products.  DESSERTS & SWEETS In  moderation Water and fruit ices; gelatin; angel food cake. Homemade cookies, pies, or cakes made from allowed ingredients. Pudding (if made with water or a milk substitute). Lactose-free tofu desserts. Sugar, honey, corn syrup, jam, jelly; marmalade; molasses (beet sugar); Pure sugar candy; marshmallows. Ice cream, ice milk, sherbet, custard, pudding, frozen yogurt. Commercial cake and cookie mixes. Desserts that contain chocolate. Pie crust made with milk-containing margarine; reduced-calorie desserts made with a sugar substitute that contains lactose. Toffee, peppermint, butterscotch, chocolate, caramels.  FATS & OILS In moderation Butter (as tolerated; contains very small amounts of lactose). Margarines and dressings that do not contain milk, Vegetable oils, shortening, Miracle Whip, mayonnaise, nondairy cream & whipped toppings without lactose or milk solids added (examples: Coffee Rich, Carnation Coffeemate, Rich's Whipped Topping, PolyRich). Tomasa Blase. Margarines and salad dressings containing milk; cream, cream cheese; peanut butter with added milk solids, sour cream, chip dips, made with sour cream.  BEVERAGES Carbonated drinks; tea; coffee and freeze-dried coffee; some instant coffees (check labels). Fruit drinks; fruit and vegetable juice; Rice or Soy milk. Ovaltine, hot chocolate. Some cocoas; some instant coffees; instant iced teas; powdered fruit drinks (read labels).   CONDIMENTS / MISCELLANEOUS Soy sauce, carob powder, olives, gravy made with water, baker's cocoa, pickles, pure seasonings and spices, wine, pure monosodium glutamate, catsup, mustard. Some chewing gums, chocolate, some cocoas. Certain antibiotics and vitamin / mineral preparations. Spice blends if they contain milk products. MSG extender. Artificial sweeteners that contain lactose such as Equal (Nutra-Sweet) and Sweet 'n Low. Some nondairy creamers (read labels).   SAMPLE MENU*  Breakfast   Orange Juice.  Banana.    Bran flakes.   Nondairy Creamer.  [place] Bread (toasted).   Butter or milk-free margarine.   Coffee or tea.    Noon Meal   Chicken Breast.  Rice.   Green beans.   Butter or milk-free margarine.  Fresh melon.   Coffee or tea.    Evening Meal   Roast Beef.  Baked potato.   Butter or milk-free margarine.   Broccoli.   Lettuce salad with vinegar and oil dressing.  [employer].   Coffee or tea.

## 2012-11-20 NOTE — Progress Notes (Signed)
Subjective:    Patient ID: Andrew Love, male    DOB: 12-26-1965, 47 y.o.   MRN: [id number]  Cassell Smiles., MD 1o END[facility]  HPI HAD PIZZA AND [place] FOOD PRIOR TO EPISODE OF COLITIS. 1ST EPISODE SINCE SURGERY AND COLITIS IN 2010. FIFTH EPISODE ALL TOGETHER. SAW BRBPR LAST WEEK: JUST A LITTLE. HAD NAUSEA/VOMITING BUT IT'S GONE. NO BLOOD IN VOMIT. PAIN ON LEFT SIDE IS BETTER, BUT NOT GONE. CANPOUSH AND DOESN'T HURT BUT IT WAS VERY PAINFUL. HAD SUBJECTIVE FEVER WITH SEVER PAIN. BMs: 1 SOFT. WHEN PAIN WAS INTENSE: SQUIRTS AND DIARRHEA BOTH TIMES. OCCASIONALLY INTERMITTENT DIARRHEA/CONSTIPATION: 3X THIS MO AFTER IMMODIUM. COULDN'T GO AND THEN ABD PAIN.  RARE ZOFRAN BUT HELPS NAUSEA.  PT DENIES FEVER, CHILLS, BRBPR, nausea, vomiting, melena, problems swallowing. RARE: heartburn or indigestion-FIRST EPISODE JUL 2014 ON VACATION. NEEDED TO TAKE MEDS. NO SORES IN MOUTH, RASH ON LEGS, JOINT PAIN. BACK PAIN DUE TO GOLF. LAST HAD TSH CHECKED ?5 YEARS AGO. NO WEIGHT LOSS.   Last TCS: HAD TROUBLE WITH GAS. NO TROUBLE WITH SEDATION.   Past Medical History  Diagnosis Date  . CAD (coronary artery disease)   . Elevated liver enzymes   . Diverticulitis     Hx of; requiring 3 admissions  . Sigmoid colon ulcer     Rectal polyps  . Thyroid disease   . Psoriasis     Per medical history form dated 06/13/10.  . Chronic insomnia     Per medical history form dated 06/13/10.  . Colitis     Per medical history from dated 06/13/10.  Marland Kitchen Hypertension   . Hypothyroidism   . Bronchitis     history of  . Back pain     chronic  . Arthritis   . 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 Colonoscopy  07/2008    Colitis,NSAID v. Ischemia,malrotation of the gut,Diverticulosis(L),hyperplastic  . Appendectomy    . Shoulder surgery      Left  . Rotator cuff repair      Left - per medical history form dated 06/13/10.  . Thyroidectomy     Per medical history form dated 06/13/10.  . Colon surgery  2012  . Total hip arthroplasty  07/04/2011    Procedure: TOTAL HIP ARTHROPLASTY;  Surgeon: Eulas Post, MD;  Location: [facility] OR;  Service: Orthopedics;  Laterality: Right;  . Joint replacement    . Cardiac catheterization  2009  . Cardiac catheterization      Per medical history from dated 06/13/10.   No Known Allergies  Current Outpatient Prescriptions  Medication Sig Dispense Refill  . Calcium Carbonate-Vitamin D (CALCIUM 600 + D PO) Take 1 tablet by mouth 2 (two) times daily.    . ciprofloxacin (CIPRO) 500 MG tablet Take 1 tablet (500 mg total) by mouth every 12 (twelve) hours.    Marland Kitchen ibuprofen (ADVIL,MOTRIN) 200 MG tablet Take 600 mg by mouth every 6 (six) hours as needed for pain. <1-2X/MO   . levothyroxine (SYNTHROID, LEVOTHROID) 200 MCG tablet Take 200 mcg by mouth daily. Take with 50 mcg to equal 250 mcg daily.    Marland Kitchen levothyroxine (SYNTHROID, LEVOTHROID) 50 MCG tablet Take 50 mcg by mouth daily. Take with 200 mcg tablet to equal 250 mcg daily.    . metroNIDAZOLE (FLAGYL) 500 MG tablet Take 1 tablet (500 mg total) by mouth 2 (two) times daily.    . nitroGLYCERIN (NITROSTAT) 0.4 MG  SL tablet Place 1 tablet (0.4 mg total) under the tongue every 5 (five) minutes x 3 doses as needed for chest pain.    Marland Kitchen olmesartan (BENICAR) 20 MG tablet Take 20 mg by mouth daily.    . ondansetron (ZOFRAN-ODT) 4 MG disintegrating tablet Take 1 tablet (4 mg total) by mouth every 8 (eight) hours as needed for nausea.    Marland Kitchen zolpidem (AMBIEN) 10 MG tablet Take 10 mg by mouth at bedtime.     Marland Kitchen HYDROcodone-acetaminophen (NORCO/VICODIN) 5-325 MG per tablet Take 1-2 tablets by mouth every 6 (six) hours as needed for pain.     Family History  Problem Relation Age of Onset  . Cancer Mother     breast cancer - per medical history form dated 06/13/10.  . Diverticulitis Mother   . Hypertension Mother   . Cancer Father     skin - per medical history form dated  06/13/10.  Marland Kitchen Hypertension Father   . Anesthesia problems Neg Hx   . Hypotension Neg Hx   . Malignant hyperthermia Neg Hx   . Pseudochol deficiency Neg Hx    History  Substance Use Topics  . Smoking status: Current Every Day Smoker -- 0.25 packs/day for 18 years  . Smokeless tobacco: Not on file     Comment: Has been wearing patch for 20 days-QUIT IN APRIL  . Alcohol Use: Yes     Comment: Occasional        Review of Systems PER HPI OTHERWISE ALL SYSTEMS ARE NEGATIVE.     Objective:   Physical Exam  Vitals reviewed. Constitutional: He is oriented to person, place, and time. He appears well-nourished. No distress.  HENT:  Head: Normocephalic and atraumatic.  Mouth/Throat: Oropharynx is clear and moist. No oropharyngeal exudate.  Eyes: Pupils are equal, round, and reactive to light. No scleral icterus.  Neck: Normal range of motion. Neck supple.  Cardiovascular: Normal rate, regular rhythm and normal heart sounds.   Pulmonary/Chest: Effort normal and breath sounds normal. No respiratory distress.  Abdominal: Soft. Bowel sounds are normal. He exhibits no distension. There is no tenderness. There is no rebound and no guarding.  Musculoskeletal: Normal range of motion. He exhibits no edema.  Lymphadenopathy:    He has no cervical adenopathy.  Neurological: He is alert and oriented to person, place, and time.  NO FOCAL DEFICITS   Psychiatric: He has a normal mood and affect.          Assessment & Plan:

## 2012-11-20 NOTE — Progress Notes (Signed)
6 MONTH REMINDER APPT MADE 

## 2012-11-22 NOTE — ED Provider Notes (Signed)
Medical screening examination/treatment/procedure(s) were performed by non-physician practitioner and as supervising physician I was immediately available for consultation/collaboration.  Olivia Mackie, MD 11/22/12 1640

## 2012-12-01 ENCOUNTER — Emergency Department (HOSPITAL_COMMUNITY)
Admission: EM | Admit: 2012-12-01 | Discharge: 2012-12-01 | Disposition: A | Discharge status: Home / Self Care | Payer: 59 | Attending: Emergency Medicine | Admitting: Emergency Medicine

## 2012-12-01 ENCOUNTER — Encounter (HOSPITAL_COMMUNITY): Payer: Self-pay | Admitting: *Deleted

## 2012-12-01 DIAGNOSIS — E079 Disorder of thyroid, unspecified: Secondary | ICD-10-CM | POA: Insufficient documentation

## 2012-12-01 DIAGNOSIS — R21 Rash and other nonspecific skin eruption: Secondary | ICD-10-CM | POA: Insufficient documentation

## 2012-12-01 DIAGNOSIS — Z79899 Other long term (current) drug therapy: Secondary | ICD-10-CM | POA: Insufficient documentation

## 2012-12-01 DIAGNOSIS — R221 Localized swelling, mass and lump, neck: Secondary | ICD-10-CM | POA: Insufficient documentation

## 2012-12-01 DIAGNOSIS — J029 Acute pharyngitis, unspecified: Secondary | ICD-10-CM | POA: Insufficient documentation

## 2012-12-01 DIAGNOSIS — Y939 Activity, unspecified: Secondary | ICD-10-CM | POA: Insufficient documentation

## 2012-12-01 DIAGNOSIS — Z872 Personal history of diseases of the skin and subcutaneous tissue: Secondary | ICD-10-CM | POA: Insufficient documentation

## 2012-12-01 DIAGNOSIS — IMO0002 Reserved for concepts with insufficient information to code with codable children: Secondary | ICD-10-CM | POA: Insufficient documentation

## 2012-12-01 DIAGNOSIS — E039 Hypothyroidism, unspecified: Secondary | ICD-10-CM | POA: Insufficient documentation

## 2012-12-01 DIAGNOSIS — I10 Essential (primary) hypertension: Secondary | ICD-10-CM | POA: Insufficient documentation

## 2012-12-01 DIAGNOSIS — F17200 Nicotine dependence, unspecified, uncomplicated: Secondary | ICD-10-CM | POA: Insufficient documentation

## 2012-12-01 DIAGNOSIS — R220 Localized swelling, mass and lump, head: Secondary | ICD-10-CM | POA: Insufficient documentation

## 2012-12-01 DIAGNOSIS — Z8679 Personal history of other diseases of the circulatory system: Secondary | ICD-10-CM | POA: Insufficient documentation

## 2012-12-01 DIAGNOSIS — Y929 Unspecified place or not applicable: Secondary | ICD-10-CM | POA: Insufficient documentation

## 2012-12-01 DIAGNOSIS — R0602 Shortness of breath: Secondary | ICD-10-CM | POA: Insufficient documentation

## 2012-12-01 DIAGNOSIS — I2510 Atherosclerotic heart disease of native coronary artery without angina pectoris: Secondary | ICD-10-CM | POA: Insufficient documentation

## 2012-12-01 DIAGNOSIS — T7840XA Allergy, unspecified, initial encounter: Secondary | ICD-10-CM

## 2012-12-01 DIAGNOSIS — Z8739 Personal history of other diseases of the musculoskeletal system and connective tissue: Secondary | ICD-10-CM | POA: Insufficient documentation

## 2012-12-01 DIAGNOSIS — Z8719 Personal history of other diseases of the digestive system: Secondary | ICD-10-CM | POA: Insufficient documentation

## 2012-12-01 DIAGNOSIS — T65891A Toxic effect of other specified substances, accidental (unintentional), initial encounter: Secondary | ICD-10-CM | POA: Insufficient documentation

## 2012-12-01 MED ORDER — EPINEPHRINE 0.3 MG/0.3ML IJ SOAJ: 0.3000 mg | INTRAMUSCULAR | Status: AC | PRN

## 2012-12-01 MED ORDER — ONDANSETRON HCL 4 MG/2ML IJ SOLN
4.0000 mg | Freq: Once | INTRAMUSCULAR | Status: AC
  Administered 2012-12-01: 4 mg via INTRAVENOUS
  Filled 2012-12-01: qty 2

## 2012-12-01 MED ORDER — FAMOTIDINE 20 MG PO TABS: 20.0000 mg | ORAL_TABLET | Freq: Two times a day (BID) | ORAL | Status: DC

## 2012-12-01 MED ORDER — METHYLPREDNISOLONE SODIUM SUCC 125 MG IJ SOLR
125.0000 mg | Freq: Once | INTRAMUSCULAR | Status: AC
  Administered 2012-12-01: 125 mg via INTRAVENOUS
  Filled 2012-12-01: qty 2

## 2012-12-01 MED ORDER — PREDNISONE 20 MG PO TABS: 60.0000 mg | ORAL_TABLET | Freq: Every day | ORAL | Status: DC

## 2012-12-01 MED ORDER — DIPHENHYDRAMINE HCL 25 MG PO CAPS: 25.0000 mg | ORAL_CAPSULE | Freq: Four times a day (QID) | ORAL | Status: DC | PRN

## 2012-12-01 MED ORDER — DIPHENHYDRAMINE HCL 50 MG/ML IJ SOLN
25.0000 mg | Freq: Once | INTRAMUSCULAR | Status: AC
  Administered 2012-12-01: 25 mg via INTRAVENOUS
  Filled 2012-12-01: qty 1

## 2012-12-01 NOTE — ED Notes (Signed)
Pt called EMS for allergic reaction to possibly dust mites; on EMS arrival pt covered in hives/itching; c/o beginning to have difficulty breathing; EMS gave epi .3 subq; zantac 50 mg; benadryl 50 mg IV; zofran 4mg  IV; on EMS arrival bp 180/100; on arrival to ER pt continues with flushed appearance; hives; states feels better; speaking without difficulty

## 2012-12-02 ENCOUNTER — Telehealth: Payer: Self-pay

## 2012-12-02 NOTE — Telephone Encounter (Signed)
REVIEWED.  

## 2012-12-02 NOTE — ED Provider Notes (Signed)
CSN: [id number]     Arrival date & time 12/01/12  0223 History     First MD Initiated Contact with Patient 12/01/12 [phone]     Chief Complaint  Patient presents with  . Allergic Reaction   (Consider location/radiation/quality/duration/timing/severity/associated sxs/prior Treatment) HPI HX per PT  Onset PTA At work, felt like he may have been bit by something L shoulder with onset of welts, SOB, swelling ion throat, called EMS and was given epi, benadryl for itching and rash that developed top to trunk and ext Ha sh/o same with allergic reactions but never this severe, in the ER is now feeling much better, mild nausea. No emesis. No syncope. SOB and throat swelling resolved. Min rash now.   No known allergens, no new foods, soaps or detergents, no new medications  Past Medical History  Diagnosis Date  . CAD (coronary artery disease)   . Elevated liver enzymes   . Diverticulitis     Hx of; requiring 3 admissions  . Sigmoid colon ulcer     Rectal polyps  . Thyroid disease   . Psoriasis     Per medical history form dated 06/13/10.  . Chronic insomnia     Per medical history form dated 06/13/10.  . Colitis     Per medical history from dated 06/13/10.  Marland Kitchen Hypertension   . Hypothyroidism   . Bronchitis     history of  . Back pain     chronic  . Arthritis   . 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 Colonoscopy  07/2008    Colitis,NSAID v. Ischemia,malrotation of the gut,Diverticulosis(L),hyperplastic  . Appendectomy    . Shoulder surgery      Left  . Rotator cuff repair      Left - per medical history form dated 06/13/10.  . Thyroidectomy      Per medical history form dated 06/13/10.  . Colon surgery  2012  . Total hip arthroplasty  07/04/2011    Procedure: TOTAL HIP ARTHROPLASTY;  Surgeon: Eulas Post, MD;  Location: [facility] OR;  Service: Orthopedics;  Laterality: Right;  . Joint replacement    . Cardiac  catheterization  2009  . Cardiac catheterization      Per medical history from dated 06/13/10.   Family History  Problem Relation Age of Onset  . Cancer Mother     breast cancer - per medical history form dated 06/13/10.  . Diverticulitis Mother   . Hypertension Mother   . Cancer Father     skin - per medical history form dated 06/13/10.  Marland Kitchen Hypertension Father   . Anesthesia problems Neg Hx   . Hypotension Neg Hx   . Malignant hyperthermia Neg Hx   . Pseudochol deficiency Neg Hx    History  Substance Use Topics  . Smoking status: Current Every Day Smoker -- 0.25 packs/day for 18 years  . Smokeless tobacco: Not on file     Comment: Has been wearing patch for 20 days  . Alcohol Use: Yes     Comment: Occasional    Review of Systems  Constitutional: Negative for fever and chills.  HENT: Positive for sore throat. Negative for facial swelling and trouble swallowing.   Eyes: Negative for visual disturbance.  Respiratory: Positive for shortness of breath.   Cardiovascular: Negative for chest pain.  Gastrointestinal: Negative for abdominal pain.  Genitourinary: Negative for dysuria.  Musculoskeletal: Negative for back pain.  Skin: Positive for rash.  Neurological: Negative for headaches.  All other systems reviewed and are negative.    Allergies  Review of patient's allergies indicates no known allergies.  Home Medications   Current Outpatient Rx  Name  Route  Sig  Dispense  Refill  . ciprofloxacin (CIPRO) 500 MG tablet   Oral   Take 1 tablet (500 mg total) by mouth every 12 (twelve) hours.   20 tablet   0   . cyclobenzaprine (FLEXERIL) 10 MG tablet   Oral   Take 10 mg by mouth 2 (two) times daily as needed for muscle spasms.         Marland Kitchen HYDROcodone-acetaminophen (NORCO/VICODIN) 5-325 MG per tablet   Oral   Take 1-2 tablets by mouth every 6 (six) hours as needed for pain.   20 tablet   0   . ibuprofen (ADVIL,MOTRIN) 200 MG tablet   Oral   Take 600 mg by mouth every  6 (six) hours as needed for pain.         Marland Kitchen levothyroxine (SYNTHROID, LEVOTHROID) 200 MCG tablet   Oral   Take 200 mcg by mouth every morning. Take with 50 mcg to equal 250 mcg daily.         Marland Kitchen levothyroxine (SYNTHROID, LEVOTHROID) 50 MCG tablet   Oral   Take 50 mcg by mouth every morning. Take with 200 mcg tablet to equal 250 mcg daily.         . metroNIDAZOLE (FLAGYL) 500 MG tablet   Oral   Take 1 tablet (500 mg total) by mouth 2 (two) times daily.   20 tablet   0   . naproxen sodium (ANAPROX) 220 MG tablet   Oral   Take 220 mg by mouth 2 (two) times daily as needed (back pain).         Marland Kitchen olmesartan (BENICAR) 20 MG tablet   Oral   Take 20 mg by mouth every evening.          . ondansetron (ZOFRAN-ODT) 4 MG disintegrating tablet   Oral   Take 1 tablet (4 mg total) by mouth every 8 (eight) hours as needed for nausea.   20 tablet   0   . zolpidem (AMBIEN) 10 MG tablet   Oral   Take 10 mg by mouth at bedtime as needed for sleep.          . diphenhydrAMINE (BENADRYL) 25 mg capsule   Oral   Take 1 capsule (25 mg total) by mouth every 6 (six) hours as needed for itching.   30 capsule   0   . EPINEPHrine (EPIPEN) 0.3 mg/0.3 mL SOAJ   Intramuscular   Inject 0.3 mLs (0.3 mg total) into the muscle as needed.   1 Device   3   . famotidine (PEPCID) 20 MG tablet   Oral   Take 1 tablet (20 mg total) by mouth 2 (two) times daily.   30 tablet   0   . nitroGLYCERIN (NITROSTAT) 0.4 MG SL tablet   Sublingual   Place 0.4 mg under the tongue every 5 (five) minutes x 3 doses as needed for chest pain.         . peg 3350 powder (MOVIPREP) 100 G SOLR   Oral   Take 1 kit (100 g total) by mouth as directed.   1 kit   0   . predniSONE (DELTASONE) 20 MG tablet   Oral   Take 3 tablets (60 mg  total) by mouth daily.   15 tablet   0    BP 136/61  Pulse 60  Temp(Src) 97.5 F (36.4 C) (Oral)  Resp 18  SpO2 98% Physical Exam  Constitutional: He is oriented to  person, place, and time. He appears well-developed and well-nourished.  HENT:  Head: Normocephalic and atraumatic.  Mouth/Throat: Oropharynx is clear and moist. No oropharyngeal exudate.  Eyes: EOM are normal. Pupils are equal, round, and reactive to light.  Neck: Neck supple. No thyromegaly present.  Cardiovascular: Normal rate, regular rhythm and intact distal pulses.   Pulmonary/Chest: Effort normal and breath sounds normal. No stridor. No respiratory distress. He has no wheezes.  Abdominal: Soft. There is no tenderness.  Musculoskeletal: Normal range of motion. He exhibits no edema.  Lymphadenopathy:    He has no cervical adenopathy.  Neurological: He is alert and oriented to person, place, and time.  Skin: Skin is warm and dry.  A few areas of erythematous blanching rash to extremities    ED Course   Procedures (including critical care time)  Solumedrol provided. zofran for nasuea. Benadryl for some return of itching  Observed in ED s/p epi in route No return of throat or resp symptoms. Plan dc/ home Epi pen, pred x 5 days, benadryl prn.  Plan f/u PCP for referral or allergist, food diary and allergic reaction precautions  1. Allergic reaction, initial encounter     MDM  Severe allergic reaction resolved with medications provided. No resp symptoms in the ED  VS, prior records and nurses notes reviewed  Sunnie Nielsen, MD 12/02/12 [phone]

## 2012-12-02 NOTE — Telephone Encounter (Signed)
PT called and said he had a severe allergic reaction to dust mite or insect at work and had to go to the ED on 12/01/2012. He was still not feeling very well and rescheduled his colonoscopy to 12/13/2012 at 11:15 AM. [facility] for Kim.

## 2012-12-13 ENCOUNTER — Ambulatory Visit (HOSPITAL_COMMUNITY)
Admission: RE | Admit: 2012-12-13 | Discharge: 2012-12-13 | Disposition: A | Discharge status: Home / Self Care | Payer: 59 | Source: Ambulatory Visit | Attending: Gastroenterology | Admitting: Gastroenterology

## 2012-12-13 ENCOUNTER — Encounter (HOSPITAL_COMMUNITY): Payer: Self-pay | Admitting: *Deleted

## 2012-12-13 ENCOUNTER — Encounter (HOSPITAL_COMMUNITY)
Admission: RE | Disposition: A | Discharge status: Home / Self Care | Payer: Self-pay | Source: Ambulatory Visit | Attending: Gastroenterology

## 2012-12-13 DIAGNOSIS — K621 Rectal polyp: Secondary | ICD-10-CM

## 2012-12-13 DIAGNOSIS — Z8719 Personal history of other diseases of the digestive system: Secondary | ICD-10-CM

## 2012-12-13 DIAGNOSIS — Z9889 Other specified postprocedural states: Secondary | ICD-10-CM | POA: Insufficient documentation

## 2012-12-13 DIAGNOSIS — K625 Hemorrhage of anus and rectum: Secondary | ICD-10-CM

## 2012-12-13 DIAGNOSIS — R197 Diarrhea, unspecified: Secondary | ICD-10-CM

## 2012-12-13 DIAGNOSIS — K648 Other hemorrhoids: Secondary | ICD-10-CM | POA: Insufficient documentation

## 2012-12-13 DIAGNOSIS — K529 Noninfective gastroenteritis and colitis, unspecified: Secondary | ICD-10-CM

## 2012-12-13 DIAGNOSIS — K5730 Diverticulosis of large intestine without perforation or abscess without bleeding: Secondary | ICD-10-CM

## 2012-12-13 DIAGNOSIS — D129 Benign neoplasm of anus and anal canal: Secondary | ICD-10-CM | POA: Insufficient documentation

## 2012-12-13 DIAGNOSIS — Z9049 Acquired absence of other specified parts of digestive tract: Secondary | ICD-10-CM | POA: Insufficient documentation

## 2012-12-13 DIAGNOSIS — K620 Anal polyp: Secondary | ICD-10-CM

## 2012-12-13 DIAGNOSIS — D126 Benign neoplasm of colon, unspecified: Secondary | ICD-10-CM | POA: Insufficient documentation

## 2012-12-13 DIAGNOSIS — I10 Essential (primary) hypertension: Secondary | ICD-10-CM | POA: Insufficient documentation

## 2012-12-13 DIAGNOSIS — D128 Benign neoplasm of rectum: Secondary | ICD-10-CM | POA: Insufficient documentation

## 2012-12-13 HISTORY — PX: COLONOSCOPY: SHX5424

## 2012-12-13 HISTORY — DX: Sleep apnea, unspecified: G47.30

## 2012-12-13 SURGERY — COLONOSCOPY
Anesthesia: Moderate Sedation

## 2012-12-13 MED ORDER — MIDAZOLAM HCL 5 MG/5ML IJ SOLN
INTRAMUSCULAR | Status: AC
  Filled 2012-12-13: qty 10

## 2012-12-13 MED ORDER — PROMETHAZINE HCL 25 MG/ML IJ SOLN
INTRAMUSCULAR | Status: DC | PRN
  Administered 2012-12-13: 12.5 mg via INTRAVENOUS

## 2012-12-13 MED ORDER — MEPERIDINE HCL 100 MG/ML IJ SOLN
INTRAMUSCULAR | Status: AC
  Filled 2012-12-13: qty 2

## 2012-12-13 MED ORDER — FENTANYL CITRATE 0.05 MG/ML IJ SOLN
INTRAMUSCULAR | Status: AC
  Filled 2012-12-13: qty 4

## 2012-12-13 MED ORDER — PROMETHAZINE HCL 25 MG/ML IJ SOLN
INTRAMUSCULAR | Status: AC
  Filled 2012-12-13: qty 1

## 2012-12-13 MED ORDER — MIDAZOLAM HCL 5 MG/5ML IJ SOLN
INTRAMUSCULAR | Status: DC | PRN
Start: 2012-12-13 — End: 2012-12-13
  Administered 2012-12-13 (×3): 2 mg via INTRAVENOUS

## 2012-12-13 MED ORDER — SODIUM CHLORIDE 0.9 % IV SOLN: INTRAVENOUS | Status: DC

## 2012-12-13 MED ORDER — FENTANYL CITRATE 0.05 MG/ML IJ SOLN
INTRAMUSCULAR | Status: DC | PRN
  Administered 2012-12-13: 50 ug via INTRAVENOUS
  Administered 2012-12-13 (×2): 25 ug via INTRAVENOUS

## 2012-12-13 MED ORDER — SODIUM CHLORIDE 0.9 % IJ SOLN
INTRAMUSCULAR | Status: AC
  Filled 2012-12-13: qty 10

## 2012-12-13 NOTE — H&P (View-Only) (Signed)
Subjective:    Patient ID: Andrew Love, male    DOB: 01/13/1966, 47 y.o.   MRN: [id number]  FUSCO,LAWRENCE J., MD 1o ENDO: [facility]  HPI HAD PIZZA AND [employer] PRIOR TO EPISODE OF COLITIS. 1ST EPISODE SINCE SURGERY AND COLITIS IN 2010. FIFTH EPISODE ALL TOGETHER. SAW BRBPR LAST WEEK: JUST A LITTLE. HAD NAUSEA/VOMITING BUT IT'S GONE. NO BLOOD IN VOMIT. PAIN ON LEFT SIDE IS BETTER, BUT NOT GONE. CANPOUSH AND DOESN'T HURT BUT IT WAS VERY PAINFUL. HAD SUBJECTIVE FEVER WITH SEVER PAIN. BMs: 1 SOFT. WHEN PAIN WAS INTENSE: SQUIRTS AND DIARRHEA BOTH TIMES. OCCASIONALLY INTERMITTENT DIARRHEA/CONSTIPATION: 3X THIS MO AFTER IMMODIUM. COULDN'T GO AND THEN ABD PAIN.  RARE ZOFRAN BUT HELPS NAUSEA.  PT DENIES FEVER, CHILLS, BRBPR, nausea, vomiting, melena, problems swallowing. RARE: heartburn or indigestion-FIRST EPISODE JUL 2014 ON VACATION. NEEDED TO TAKE MEDS. NO SORES IN MOUTH, RASH ON LEGS, JOINT PAIN. BACK PAIN DUE TO GOLF. LAST HAD TSH CHECKED ?5 YEARS AGO. NO WEIGHT LOSS.   Last TCS: HAD TROUBLE WITH GAS. NO TROUBLE WITH SEDATION.   Past Medical History  Diagnosis Date  . CAD (coronary artery disease)   . Elevated liver enzymes   . Diverticulitis     Hx of; requiring 3 admissions  . Sigmoid colon ulcer     Rectal polyps  . Thyroid disease   . Psoriasis     Per medical history form dated 06/13/10.  . Chronic insomnia     Per medical history form dated 06/13/10.  . Colitis     Per medical history from dated 06/13/10.  . Hypertension   . Hypothyroidism   . Bronchitis     history of  . Back pain     chronic  . Arthritis   . 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 Colonoscopy  07/2008    Colitis,NSAID v. Ischemia,malrotation of the gut,Diverticulosis(L),hyperplastic  . Appendectomy    . Shoulder surgery      Left  . Rotator cuff repair      Left - per medical history form dated 06/13/10.  . Thyroidectomy     Per medical history form dated 06/13/10.  . Colon surgery  2012  . Total hip arthroplasty  07/04/2011    Procedure: TOTAL HIP ARTHROPLASTY;  Surgeon: Joshua P Landau, MD;  Location: [facility] OR;  Service: Orthopedics;  Laterality: Right;  . Joint replacement    . Cardiac catheterization  2009  . Cardiac catheterization      Per medical history from dated 06/13/10.   No Known Allergies  Current Outpatient Prescriptions  Medication Sig Dispense Refill  . Calcium Carbonate-Vitamin D (CALCIUM 600 + D PO) Take 1 tablet by mouth 2 (two) times daily.    . ciprofloxacin (CIPRO) 500 MG tablet Take 1 tablet (500 mg total) by mouth every 12 (twelve) hours.    . ibuprofen (ADVIL,MOTRIN) 200 MG tablet Take 600 mg by mouth every 6 (six) hours as needed for pain. <1-2X/MO   . levothyroxine (SYNTHROID, LEVOTHROID) 200 MCG tablet Take 200 mcg by mouth daily. Take with 50 mcg to equal 250 mcg daily.    . levothyroxine (SYNTHROID, LEVOTHROID) 50 MCG tablet Take 50 mcg by mouth daily. Take with 200 mcg tablet to equal 250 mcg daily.    . metroNIDAZOLE (FLAGYL) 500 MG tablet Take 1 tablet (500 mg total) by mouth 2 (two) times daily.    . nitroGLYCERIN (NITROSTAT) 0.4 MG   SL tablet Place 1 tablet (0.4 mg total) under the tongue every 5 (five) minutes x 3 doses as needed for chest pain.    . olmesartan (BENICAR) 20 MG tablet Take 20 mg by mouth daily.    . ondansetron (ZOFRAN-ODT) 4 MG disintegrating tablet Take 1 tablet (4 mg total) by mouth every 8 (eight) hours as needed for nausea.    . zolpidem (AMBIEN) 10 MG tablet Take 10 mg by mouth at bedtime.     . HYDROcodone-acetaminophen (NORCO/VICODIN) 5-325 MG per tablet Take 1-2 tablets by mouth every 6 (six) hours as needed for pain.     Family History  Problem Relation Age of Onset  . Cancer Mother     breast cancer - per medical history form dated 06/13/10.  . Diverticulitis Mother   . Hypertension Mother   . Cancer Father     skin - per medical history form dated  06/13/10.  . Hypertension Father   . Anesthesia problems Neg Hx   . Hypotension Neg Hx   . Malignant hyperthermia Neg Hx   . Pseudochol deficiency Neg Hx    History  Substance Use Topics  . Smoking status: Current Every Day Smoker -- 0.25 packs/day for 18 years  . Smokeless tobacco: Not on file     Comment: Has been wearing patch for 20 days-QUIT IN APRIL  . Alcohol Use: Yes     Comment: Occasional        Review of Systems PER HPI OTHERWISE ALL SYSTEMS ARE NEGATIVE.     Objective:   Physical Exam  Vitals reviewed. Constitutional: He is oriented to person, place, and time. He appears well-nourished. No distress.  HENT:  Head: Normocephalic and atraumatic.  Mouth/Throat: Oropharynx is clear and moist. No oropharyngeal exudate.  Eyes: Pupils are equal, round, and reactive to light. No scleral icterus.  Neck: Normal range of motion. Neck supple.  Cardiovascular: Normal rate, regular rhythm and normal heart sounds.   Pulmonary/Chest: Effort normal and breath sounds normal. No respiratory distress.  Abdominal: Soft. Bowel sounds are normal. He exhibits no distension. There is no tenderness. There is no rebound and no guarding.  Musculoskeletal: Normal range of motion. He exhibits no edema.  Lymphadenopathy:    He has no cervical adenopathy.  Neurological: He is alert and oriented to person, place, and time.  NO FOCAL DEFICITS   Psychiatric: He has a normal mood and affect.          Assessment & Plan:   

## 2012-12-13 NOTE — Op Note (Signed)
[facility] [address] [place], [address]   COLONOSCOPY PROCEDURE REPORT  PATIENT: Andrew Love, Andrew Love  MR#: [id number] BIRTHDATE: 11-08-1965 , 47  yrs. old GENDER: Male ENDOSCOPIST: Jonette Eva, MD REFERRED AO:ZHYQM Sherwood Gambler, M.D. PROCEDURE DATE:  12/13/2012 PROCEDURE:   Colonoscopy with cold biopsy polypectomy and with snare polypectomy INDICATIONS:Rectal Bleeding and unexplained diarrhea.  CT JUL 2014: SEGMENTAL COLITIS MEDICATIONS: Promethazine (Phenergan) 12.5mg  IV, Fentanyl 100 mcg IV, and Versed 6 mg IV  DESCRIPTION OF PROCEDURE:    Physical exam was performed.  Informed consent was obtained from the patient after explaining the benefits, risks, and alternatives to procedure.  The patient was connected to monitor and placed in left lateral position. Continuous oxygen was provided by nasal cannula and IV medicine administered through an indwelling cannula.  After administration of sedation and rectal exam, the patients rectum was intubated and the EC-3890Li ([id number])  colonoscope was advanced under direct visualization to the ileum.  The scope was removed slowly by carefully examining the color, texture, anatomy, and integrity mucosa on the way out.  The patient was recovered in endoscopy and discharged home in satisfactory condition.    COLON FINDINGS: The mucosa appeared normal in the terminal ileum.  10 CM VISUALIZED. , Two sessile polyps ranging between 3-15mm in size were found in the descending colon([place]) and rectum.  A polypectomy was performed with cold forceps and using snare cautery. RANDOM COLD FORCEPS BIOPSIES OBTAINED TO EVALUATE FOR MICROSCOPIC COLITIS.   , Mild diverticulosis was noted in the ?sigmoid colon.  Moderate sized internal hemorrhoids were found.   PREP QUALITY: good.   CECAL W/D TIME: 19 minutes     COMPLICATIONS: None  ENDOSCOPIC IMPRESSION: 1.   Two COLON polyps REMOVED 2.   Mild diverticulosis in the sigmoid colon 3.    Moderate sized internal hemorrhoids  RECOMMENDATIONS: FOLLOW A HIGH FIBER/DAIRTY FREE DIET.  AVOID ITEMS THAT CAUSE BLOATING.  SEE INFO BELOW. BIOPSY RESULTS SHOULD BE BACK IN 7 DAYS. CHECK TSH TODAY. OPV IN 6 MOS TO 1 YEAR. Next colonoscopy in 10 years WITH FENTANYL/PHENERGAN.       _______________________________ Rosalie DoctorJonette Eva, MD 12/13/2012 2:58 PM     PATIENT NAME:  Andrew Love MR#: [id number]

## 2012-12-13 NOTE — Interval H&P Note (Signed)
History and Physical Interval Note:  12/13/2012 11:15 AM  [name]  has presented today for surgery, with the diagnosis of RECTAL BLEEDING  The various methods of treatment have been discussed with the patient and family. After consideration of risks, benefits and other options for treatment, the patient has consented to  Procedure(s) with comments: COLONOSCOPY (N/A) - 10:00AM-rescheduled to 11:15 Doris notified pt as a surgical intervention .  The patient's history has been reviewed, patient examined, no change in status, stable for surgery.  I have reviewed the patient's chart and labs.  Questions were answered to the patient's satisfaction.     [employer]

## 2012-12-14 LAB — TSH: TSH: 2.799 u[IU]/mL (ref 0.350–4.500)

## 2012-12-17 ENCOUNTER — Encounter (HOSPITAL_COMMUNITY): Payer: Self-pay | Admitting: Gastroenterology

## 2012-12-20 ENCOUNTER — Telehealth: Payer: Self-pay | Admitting: Gastroenterology

## 2012-12-20 NOTE — Telephone Encounter (Signed)
Called patient TO DISCUSS RESULTS. LVM AT HOME/WORK-CALL [phone] TO DISCUSS. HIS colon are normal. He had A simple adenoma removed from hIS colon.   FOLLOW A HIGH FIBER DIET. AVOID ITEMS THAT CAUSE BLOATING.   TAKE A PROBIOTIC DAILY FOR THREE MONTHS ([employer] BRAND, [employer] COLON HEALTH, ALIGN).  AVOID DAIRY FOR ONE MONTH.   Next colonoscopy in 10 years WITH FENTANYL/PHENERGAN.

## 2012-12-23 ENCOUNTER — Telehealth: Payer: Self-pay | Admitting: *Deleted

## 2012-12-23 NOTE — Telephone Encounter (Signed)
LMOM to call.

## 2012-12-23 NOTE — Telephone Encounter (Signed)
Called and informed pt.  

## 2012-12-23 NOTE — Telephone Encounter (Signed)
Pt wants results from TCS. Please advise [phone]

## 2012-12-23 NOTE — Telephone Encounter (Signed)
See results note. Pt aware.  

## 2013-01-08 ENCOUNTER — Encounter (HOSPITAL_COMMUNITY): Payer: Self-pay

## 2013-01-08 ENCOUNTER — Emergency Department (HOSPITAL_COMMUNITY)
Admission: EM | Admit: 2013-01-08 | Discharge: 2013-01-08 | Disposition: A | Discharge status: Home / Self Care | Payer: 59 | Attending: Emergency Medicine | Admitting: Emergency Medicine

## 2013-01-08 DIAGNOSIS — F17200 Nicotine dependence, unspecified, uncomplicated: Secondary | ICD-10-CM | POA: Insufficient documentation

## 2013-01-08 DIAGNOSIS — Z8719 Personal history of other diseases of the digestive system: Secondary | ICD-10-CM | POA: Insufficient documentation

## 2013-01-08 DIAGNOSIS — Z872 Personal history of diseases of the skin and subcutaneous tissue: Secondary | ICD-10-CM | POA: Insufficient documentation

## 2013-01-08 DIAGNOSIS — H8110 Benign paroxysmal vertigo, unspecified ear: Secondary | ICD-10-CM | POA: Insufficient documentation

## 2013-01-08 DIAGNOSIS — IMO0002 Reserved for concepts with insufficient information to code with codable children: Secondary | ICD-10-CM | POA: Insufficient documentation

## 2013-01-08 DIAGNOSIS — Z9861 Coronary angioplasty status: Secondary | ICD-10-CM | POA: Insufficient documentation

## 2013-01-08 DIAGNOSIS — M129 Arthropathy, unspecified: Secondary | ICD-10-CM | POA: Insufficient documentation

## 2013-01-08 DIAGNOSIS — Z79899 Other long term (current) drug therapy: Secondary | ICD-10-CM | POA: Insufficient documentation

## 2013-01-08 DIAGNOSIS — R209 Unspecified disturbances of skin sensation: Secondary | ICD-10-CM | POA: Insufficient documentation

## 2013-01-08 DIAGNOSIS — I2510 Atherosclerotic heart disease of native coronary artery without angina pectoris: Secondary | ICD-10-CM | POA: Insufficient documentation

## 2013-01-08 DIAGNOSIS — G4733 Obstructive sleep apnea (adult) (pediatric): Secondary | ICD-10-CM | POA: Insufficient documentation

## 2013-01-08 DIAGNOSIS — Z8709 Personal history of other diseases of the respiratory system: Secondary | ICD-10-CM | POA: Insufficient documentation

## 2013-01-08 DIAGNOSIS — E039 Hypothyroidism, unspecified: Secondary | ICD-10-CM | POA: Insufficient documentation

## 2013-01-08 DIAGNOSIS — I10 Essential (primary) hypertension: Secondary | ICD-10-CM | POA: Insufficient documentation

## 2013-01-08 DIAGNOSIS — M167 Other unilateral secondary osteoarthritis of hip: Secondary | ICD-10-CM | POA: Insufficient documentation

## 2013-01-08 LAB — POCT I-STAT, CHEM 8
BUN: 12 mg/dL (ref 6–23)
Calcium, Ion: 1.15 mmol/L (ref 1.12–1.23)
Chloride: 103 mEq/L (ref 96–112)
Creatinine, Ser: 1 mg/dL (ref 0.50–1.35)
Glucose, Bld: 106 mg/dL — ABNORMAL HIGH (ref 70–99)
HCT: 45 % (ref 39.0–52.0)
Hemoglobin: 15.3 g/dL (ref 13.0–17.0)
Potassium: 3.4 mEq/L — ABNORMAL LOW (ref 3.5–5.1)
Sodium: 141 mEq/L (ref 135–145)
TCO2: 24 mmol/L (ref 0–100)

## 2013-01-08 MED ORDER — MECLIZINE HCL 25 MG PO TABS
25.0000 mg | ORAL_TABLET | Freq: Once | ORAL | Status: AC
  Administered 2013-01-08: 25 mg via ORAL
  Filled 2013-01-08: qty 1

## 2013-01-08 MED ORDER — LORAZEPAM 2 MG/ML IJ SOLN
1.0000 mg | Freq: Once | INTRAMUSCULAR | Status: AC
  Administered 2013-01-08: 1 mg via INTRAVENOUS
  Filled 2013-01-08: qty 1

## 2013-01-08 MED ORDER — MECLIZINE HCL 25 MG PO TABS: 25.0000 mg | ORAL_TABLET | Freq: Three times a day (TID) | ORAL | Status: DC | PRN

## 2013-01-08 MED ORDER — DIPHENHYDRAMINE HCL 50 MG/ML IJ SOLN
25.0000 mg | Freq: Once | INTRAMUSCULAR | Status: AC
  Administered 2013-01-08: 25 mg via INTRAVENOUS
  Filled 2013-01-08: qty 1

## 2013-01-08 NOTE — ED Provider Notes (Signed)
CSN: [id number]     Arrival date & time 01/08/13  0431 History   First MD Initiated Contact with Patient 01/08/13 0434     Chief Complaint  Patient presents with  . Headache  . Dizziness   (Consider location/radiation/quality/duration/timing/severity/associated sxs/prior Treatment) HPI 47 yo male presents to the ER from work via EMS with complaint of dizziness described as the room spinning around him, headache, numbness/tingling of right palm and dorsum of hand, elevated bp and severe nausea.  Sxs started around 3 am.  Pt was in his car for his lunch break, laying reclined and listening to music.  When he sat up to go back to work he had acute onset of symptoms.  Headache is dull, across the top of his head.  Vertigo is worse with laying flat.  He reports intermittent tingling to his extremities intermittently over the last 2 weeks.  Pt reports he had BP of 180/110 when sxs were at their worst tonight.  Pt also reports he has been more forgetful over the last several months.  Pt with h/o CAD, HTN, OSA, hypothyroidism, and diverticulitis.    Past Medical History  Diagnosis Date  . CAD (coronary artery disease)   . Elevated liver enzymes   . Diverticulitis     Hx of; requiring 3 admissions  . Sigmoid colon ulcer     Rectal polyps  . Thyroid disease   . Psoriasis     Per medical history form dated 06/13/10.  . Chronic insomnia     Per medical history form dated 06/13/10.  . Colitis     Per medical history from dated 06/13/10.  Marland Kitchen Hypertension   . Hypothyroidism   . Bronchitis     history of  . Back pain     chronic  . Arthritis   . 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 Sleep apnea with use of continuous positive airway pressure (CPAP)    Past Surgical History  Procedure Laterality Date  . Colonoscopy  07/2008    Colitis,NSAID v. Ischemia,malrotation of the gut,Diverticulosis(L),hyperplastic  . Appendectomy    . Shoulder surgery      Left  . Rotator cuff  repair      Left - per medical history form dated 06/13/10.  . Thyroidectomy      Per medical history form dated 06/13/10.  . Colon surgery  2012  . Total hip arthroplasty  07/04/2011    Procedure: TOTAL HIP ARTHROPLASTY;  Surgeon: Eulas Post, MD;  Location: [facility] OR;  Service: Orthopedics;  Laterality: Right;  . Joint replacement    . Cardiac catheterization  2009  . Cardiac catheterization      Per medical history from dated 06/13/10.  . Colonoscopy N/A 12/13/2012    Procedure: COLONOSCOPY;  Surgeon: West Bali, MD;  Location: [facility];  Service: Endoscopy;  Laterality: N/A;  10:00AM-rescheduled to 11:15 Doris notified pt   Family History  Problem Relation Age of Onset  . Cancer Mother     breast cancer - per medical history form dated 06/13/10.  . Diverticulitis Mother   . Hypertension Mother   . Cancer Father     skin - per medical history form dated 06/13/10.  Marland Kitchen Hypertension Father   . Anesthesia problems Neg Hx   . Hypotension Neg Hx   . Malignant hyperthermia Neg Hx   . Pseudochol deficiency Neg Hx    History  Substance Use Topics  . Smoking status: Current Every Day  Smoker -- 0.25 packs/day for 18 years  . Smokeless tobacco: Never Used     Comment: Has been wearing patch for 20 days  . Alcohol Use: Yes     Comment: Occasional    Review of Systems  All other systems reviewed and are negative.    Allergies  Other  Home Medications   Current Outpatient Rx  Name  Route  Sig  Dispense  Refill  . cyclobenzaprine (FLEXERIL) 10 MG tablet   Oral   Take 10 mg by mouth 2 (two) times daily as needed for muscle spasms.         . diphenhydrAMINE (BENADRYL) 25 mg capsule   Oral   Take 1 capsule (25 mg total) by mouth every 6 (six) hours as needed for itching.   30 capsule   0   . EPINEPHrine (EPIPEN) 0.3 mg/0.3 mL SOAJ   Intramuscular   Inject 0.3 mLs (0.3 mg total) into the muscle as needed.   1 Device   3   . famotidine (PEPCID) 20 MG tablet   Oral   Take  1 tablet (20 mg total) by mouth 2 (two) times daily.   30 tablet   0   . ibuprofen (ADVIL,MOTRIN) 200 MG tablet   Oral   Take 600 mg by mouth every 6 (six) hours as needed for pain.         Marland Kitchen levothyroxine (SYNTHROID, LEVOTHROID) 200 MCG tablet   Oral   Take 200 mcg by mouth every morning. Take with 50 mcg to equal 250 mcg daily.         Marland Kitchen levothyroxine (SYNTHROID, LEVOTHROID) 50 MCG tablet   Oral   Take 50 mcg by mouth every morning. Take with 200 mcg tablet to equal 250 mcg daily.         . metroNIDAZOLE (FLAGYL) 500 MG tablet   Oral   Take 1 tablet (500 mg total) by mouth 2 (two) times daily.   20 tablet   0   . naproxen sodium (ANAPROX) 220 MG tablet   Oral   Take 220 mg by mouth 2 (two) times daily as needed (back pain).         . nitroGLYCERIN (NITROSTAT) 0.4 MG SL tablet   Sublingual   Place 0.4 mg under the tongue every 5 (five) minutes x 3 doses as needed for chest pain.         Marland Kitchen olmesartan (BENICAR) 20 MG tablet   Oral   Take 20 mg by mouth every evening.          . ondansetron (ZOFRAN-ODT) 4 MG disintegrating tablet   Oral   Take 1 tablet (4 mg total) by mouth every 8 (eight) hours as needed for nausea.   20 tablet   0   . peg 3350 powder (MOVIPREP) 100 G SOLR   Oral   Take 1 kit (100 g total) by mouth as directed.   1 kit   0   . predniSONE (DELTASONE) 20 MG tablet   Oral   Take 3 tablets (60 mg total) by mouth daily.   15 tablet   0   . zolpidem (AMBIEN) 10 MG tablet   Oral   Take 10 mg by mouth at bedtime as needed for sleep.           BP 127/69  Pulse 94  Temp(Src) 98.6 F (37 C) (Oral)  Resp 22  SpO2 99% Physical Exam  Nursing note and vitals reviewed.  Constitutional: He is oriented to person, place, and time. He appears well-developed and well-nourished. He appears distressed.  HENT:  Head: Normocephalic and atraumatic.  Right Ear: External ear normal.  Left Ear: External ear normal.  Nose: Nose normal.   Mouth/Throat: Oropharynx is clear and moist.  Eyes: Conjunctivae and EOM are normal. Pupils are equal, round, and reactive to light.  Neck: Normal range of motion. Neck supple. No JVD present. No tracheal deviation present. No thyromegaly present.  Cardiovascular: Normal rate, regular rhythm, normal heart sounds and intact distal pulses.  Exam reveals no gallop and no friction rub.   No murmur heard. Pulmonary/Chest: Effort normal and breath sounds normal. No stridor. No respiratory distress. He has no wheezes. He has no rales. He exhibits no tenderness.  Abdominal: Soft. Bowel sounds are normal. He exhibits no distension and no mass. There is no tenderness. There is no rebound and no guarding.  Musculoskeletal: Normal range of motion. He exhibits no edema and no tenderness.  Lymphadenopathy:    He has no cervical adenopathy.  Neurological: He is alert and oriented to person, place, and time. He has normal reflexes. No cranial nerve deficit. He exhibits normal muscle tone. Coordination normal.  +dix hallpike to the right  Skin: Skin is warm and dry. No rash noted. No erythema. No pallor.  Psychiatric: He has a normal mood and affect. His behavior is normal. Judgment and thought content normal.    ED Course  Procedures (including critical care time) Labs Review Labs Reviewed  POCT I-STAT, CHEM 8 - Abnormal; Notable for the following:    Potassium 3.4 (*)    Glucose, Bld 106 (*)    All other components within normal limits   Imaging Review No results found.  MDM   1. BPPV (benign paroxysmal positional vertigo)     47 yo male with vertigo that can be elicited with DH test to the right.  Sxs started after lying reclined in his car and sitting up abruptly.  Vertigo is fatiguable.  Do not feel sxs are due to posterior circulation stroke.  Will treat with ativan, benadryl, and meclizine when patient is no longer vomiting.  Will give f/u with ENT and outpatient epley's maneuvers to  do.   6:13 AM Pt reports he is feeling much better.  Will d/c home as planned.     Olivia Mackie, MD 01/08/13 [phone]

## 2013-01-08 NOTE — ED Notes (Signed)
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 Onset of symptoms around 2:00 with an single episode of blurred vision that lasted about 30 seconds. No neuro deficits. Denies any photo/phonophobia. Describes headache as intense. Patient also reports being more forgetful which has gotten worse within the past 4-5 months. 12 lead unremarkable. 20g LFA. Received Zofran 8 mg IV. BP 136/77, HR 89, SPO2 95% RA, CBG 116.

## 2013-01-08 NOTE — ED Notes (Signed)
MD at bedside. 

## 2013-07-30 ENCOUNTER — Emergency Department (HOSPITAL_COMMUNITY)
Admission: EM | Admit: 2013-07-30 | Discharge: 2013-07-30 | Disposition: A | Discharge status: Home / Self Care | Payer: 59 | Attending: Emergency Medicine | Admitting: Emergency Medicine

## 2013-07-30 ENCOUNTER — Encounter (HOSPITAL_COMMUNITY): Payer: Self-pay | Admitting: Emergency Medicine

## 2013-07-30 ENCOUNTER — Emergency Department (HOSPITAL_COMMUNITY): Payer: 59

## 2013-07-30 DIAGNOSIS — L408 Other psoriasis: Secondary | ICD-10-CM | POA: Insufficient documentation

## 2013-07-30 DIAGNOSIS — G8929 Other chronic pain: Secondary | ICD-10-CM | POA: Insufficient documentation

## 2013-07-30 DIAGNOSIS — Z9889 Other specified postprocedural states: Secondary | ICD-10-CM | POA: Insufficient documentation

## 2013-07-30 DIAGNOSIS — Z8719 Personal history of other diseases of the digestive system: Secondary | ICD-10-CM | POA: Insufficient documentation

## 2013-07-30 DIAGNOSIS — E039 Hypothyroidism, unspecified: Secondary | ICD-10-CM | POA: Insufficient documentation

## 2013-07-30 DIAGNOSIS — M5440 Lumbago with sciatica, unspecified side: Secondary | ICD-10-CM

## 2013-07-30 DIAGNOSIS — M169 Osteoarthritis of hip, unspecified: Secondary | ICD-10-CM | POA: Insufficient documentation

## 2013-07-30 DIAGNOSIS — Z79899 Other long term (current) drug therapy: Secondary | ICD-10-CM | POA: Insufficient documentation

## 2013-07-30 DIAGNOSIS — I10 Essential (primary) hypertension: Secondary | ICD-10-CM | POA: Insufficient documentation

## 2013-07-30 DIAGNOSIS — I2510 Atherosclerotic heart disease of native coronary artery without angina pectoris: Secondary | ICD-10-CM | POA: Insufficient documentation

## 2013-07-30 DIAGNOSIS — G4700 Insomnia, unspecified: Secondary | ICD-10-CM | POA: Insufficient documentation

## 2013-07-30 DIAGNOSIS — Z8709 Personal history of other diseases of the respiratory system: Secondary | ICD-10-CM | POA: Insufficient documentation

## 2013-07-30 DIAGNOSIS — G4730 Sleep apnea, unspecified: Secondary | ICD-10-CM | POA: Insufficient documentation

## 2013-07-30 DIAGNOSIS — M5430 Sciatica, unspecified side: Secondary | ICD-10-CM | POA: Insufficient documentation

## 2013-07-30 DIAGNOSIS — M545 Low back pain, unspecified: Secondary | ICD-10-CM | POA: Insufficient documentation

## 2013-07-30 DIAGNOSIS — Z87891 Personal history of nicotine dependence: Secondary | ICD-10-CM | POA: Insufficient documentation

## 2013-07-30 DIAGNOSIS — Z96649 Presence of unspecified artificial hip joint: Secondary | ICD-10-CM | POA: Insufficient documentation

## 2013-07-30 DIAGNOSIS — M1610 Unilateral primary osteoarthritis, unspecified hip: Secondary | ICD-10-CM | POA: Insufficient documentation

## 2013-07-30 DIAGNOSIS — Z9981 Dependence on supplemental oxygen: Secondary | ICD-10-CM | POA: Insufficient documentation

## 2013-07-30 DIAGNOSIS — IMO0002 Reserved for concepts with insufficient information to code with codable children: Secondary | ICD-10-CM | POA: Insufficient documentation

## 2013-07-30 MED ORDER — DIAZEPAM 5 MG PO TABS: 5.0000 mg | ORAL_TABLET | Freq: Two times a day (BID) | ORAL | Status: DC | PRN

## 2013-07-30 MED ORDER — HYDROMORPHONE HCL PF 1 MG/ML IJ SOLN
1.0000 mg | Freq: Once | INTRAMUSCULAR | Status: AC
  Administered 2013-07-30: 1 mg via INTRAMUSCULAR
  Filled 2013-07-30: qty 1

## 2013-07-30 MED ORDER — DIAZEPAM 5 MG PO TABS
5.0000 mg | ORAL_TABLET | Freq: Once | ORAL | Status: AC
  Administered 2013-07-30: 5 mg via ORAL
  Filled 2013-07-30: qty 1

## 2013-07-30 MED ORDER — OXYCODONE-ACETAMINOPHEN 5-325 MG PO TABS: 1.0000 | ORAL_TABLET | Freq: Four times a day (QID) | ORAL | Status: DC | PRN

## 2013-07-30 MED ORDER — KETOROLAC TROMETHAMINE 30 MG/ML IJ SOLN
30.0000 mg | Freq: Once | INTRAMUSCULAR | Status: AC
  Administered 2013-07-30: 30 mg via INTRAMUSCULAR
  Filled 2013-07-30: qty 1

## 2013-07-30 MED ORDER — HYDROMORPHONE HCL PF 1 MG/ML IJ SOLN
2.0000 mg | Freq: Once | INTRAMUSCULAR | Status: AC
  Administered 2013-07-30: 2 mg via INTRAMUSCULAR
  Filled 2013-07-30: qty 2

## 2013-07-30 MED ORDER — METHYLPREDNISOLONE (PAK) 4 MG PO TABS: ORAL_TABLET | ORAL | Status: DC

## 2013-07-30 NOTE — ED Notes (Signed)
Ambulated pt in hallway. Pt states it is very painful getting up, walking and sitting back down.

## 2013-07-30 NOTE — ED Notes (Signed)
Pt states he was playing golf today and now is complaining of mid back down right leg

## 2013-07-30 NOTE — ED Provider Notes (Signed)
CSN: [id number]     Arrival date & time 07/30/13  1328 History   First MD Initiated Contact with Patient 07/30/13 1351 This chart was scribed for Merryl Hacker, MD by Anastasia Pall, ED Scribe. This patient was seen in room [facility] and the patient's care was started at 1:58 PM.     Chief Complaint  Patient presents with  . Back Pain   (Consider location/radiation/quality/duration/timing/severity/associated sxs/prior Treatment) The history is provided by the patient. No language interpreter was used.   HPI Comments: Andrew Love is a 48 y.o. male who presents to the Emergency Department complaining of constant, 10/10, mid back pain, that radiates down his right LE, onset 2 hours ago while he was swinging a golf club. He reports swinging the club 2 more times afterwards, felt a rolling knot over his back. He denies h/o similar injury. He reports associated numbness in his right foot/toes. He reports being ambulatory, but states that movement exacerbates his back pain. He reports taking 3 Advil at time of incident, without relief. He denies urinary symptoms, fever, abdominal pain, and any other associated symptoms. He denies steroid use. He denies h/o smoking, but reports occasional EtOH use. Denies any bowel or bladder symptoms.  PCP - [name], MD  Past Medical History  Diagnosis Date  . CAD (coronary artery disease)   . Elevated liver enzymes   . Diverticulitis     Hx of; requiring 3 admissions  . Sigmoid colon ulcer     Rectal polyps  . Thyroid disease   . Psoriasis     Per medical history form dated 06/13/10.  . Chronic insomnia     Per medical history form dated 06/13/10.  . Colitis     Per medical history from dated 06/13/10.  Marland Kitchen Hypertension   . Hypothyroidism   . Bronchitis     history of  . Back pain     chronic  . Arthritis   . 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 Sleep apnea with use of continuous positive airway pressure (CPAP)     Past Surgical History  Procedure Laterality Date  . Colonoscopy  07/2008    Colitis,NSAID v. Ischemia,malrotation of the gut,Diverticulosis(L),hyperplastic  . Appendectomy    . Shoulder surgery      Left  . Rotator cuff repair      Left - per medical history form dated 06/13/10.  . Thyroidectomy      Per medical history form dated 06/13/10.  . Colon surgery  2012  . Total hip arthroplasty  07/04/2011    Procedure: TOTAL HIP ARTHROPLASTY;  Surgeon: Johnny Bridge, MD;  Location: [facility];  Service: Orthopedics;  Laterality: Right;  . Joint replacement    . Cardiac catheterization  2009  . Cardiac catheterization      Per medical history from dated 06/13/10.  . Colonoscopy N/A 12/13/2012    Procedure: COLONOSCOPY;  Surgeon: Danie Binder, MD;  [facility];  Service: Endoscopy;  Laterality: N/A;  10:00AM-rescheduled to 11:15 Doris notified pt   Family History  Problem Relation Age of Onset  . Cancer Mother     breast cancer - per medical history form dated 06/13/10.  . Diverticulitis Mother   . Hypertension Mother   . Cancer Father     skin - per medical history form dated 06/13/10.  Marland Kitchen Hypertension Father   . Anesthesia problems Neg Hx   . Hypotension Neg Hx   . Malignant hyperthermia  Neg Hx   . Pseudochol deficiency Neg Hx    History  Substance Use Topics  . Smoking status: Former Smoker -- 0.25 packs/day for 18 years  . Smokeless tobacco: Never Used     Comment: Has been wearing patch for 20 days  . Alcohol Use: Yes     Comment: Occasional    Review of Systems  Constitutional: Negative for fever.  Musculoskeletal: Positive for back pain. Negative for gait problem.  Neurological: Positive for numbness. Negative for weakness.  All other systems reviewed and are negative.   Allergies  Other  Home Medications   Current Outpatient Rx  Name  Route  Sig  Dispense  Refill  . EPINEPHrine (EPIPEN) 0.3 mg/0.3 mL SOAJ   Intramuscular   Inject 0.3 mLs (0.3 mg total)  into the muscle as needed.   1 Device   3   . ibuprofen (ADVIL,MOTRIN) 200 MG tablet   Oral   Take 600 mg by mouth every 6 (six) hours as needed for pain.         Marland Kitchen levothyroxine (SYNTHROID, LEVOTHROID) 300 MCG tablet   Oral   Take 300 mcg by mouth daily at 3 pm.         . nitroGLYCERIN (NITROSTAT) 0.4 MG SL tablet   Sublingual   Place 0.4 mg under the tongue every 5 (five) minutes x 3 doses as needed for chest pain.         Marland Kitchen olmesartan (BENICAR) 20 MG tablet   Oral   Take 20 mg by mouth every evening.          . ranitidine (ZANTAC) 75 MG tablet   Oral   Take 75 mg by mouth daily.         Marland Kitchen zolpidem (AMBIEN) 10 MG tablet   Oral   Take 10 mg by mouth every morning.          . diazepam (VALIUM) 5 MG tablet   Oral   Take 1 tablet (5 mg total) by mouth every 12 (twelve) hours as needed for muscle spasms.   10 tablet   0   . methylPREDNIsolone (MEDROL DOSPACK) 4 MG tablet      follow package directions   21 tablet   0   . oxyCODONE-acetaminophen (PERCOCET/ROXICET) 5-325 MG per tablet   Oral   Take 1-2 tablets by mouth every 6 (six) hours as needed for severe pain.   20 tablet   0    BP 136/92  Pulse 91  Temp(Src) 97.4 F (36.3 C) (Oral)  Resp 20  Ht 6\' 1"  (1.854 m)  Wt 228 lb (103.42 kg)  BMI 30.09 kg/m2  SpO2 97%  Physical Exam  Nursing note and vitals reviewed. Constitutional: He is oriented to person, place, and time. He appears well-developed and well-nourished.  Uncomfortable appearing  HENT:  Head: Normocephalic and atraumatic.  Cardiovascular: Normal rate, regular rhythm and normal heart sounds.   No murmur heard. Pulmonary/Chest: Effort normal and breath sounds normal. No respiratory distress. He has no wheezes.  Abdominal: Soft. There is no tenderness.  Musculoskeletal: He exhibits no edema.  No midline tenderness to palpation of the lumbar spine, right paraspinous muscle tenderness and spasm noted, positive straight leg raise and  cross leg raise  Lymphadenopathy:    He has no cervical adenopathy.  Neurological: He is alert and oriented to person, place, and time. No cranial nerve deficit.  No clonus noted, 5 out of 5 strength in bilateral lower  extremities with dorsi and plantar flexion and hip flexion  Skin: Skin is warm and dry.  Psychiatric: He has a normal mood and affect.    ED Course  Procedures (including critical care time)  DIAGNOSTIC STUDIES: Oxygen Saturation is 97% on room air, normal by my interpretation.    COORDINATION OF CARE: 2:06 PM-Discussed treatment plan which includes pain medication with pt at bedside and pt agreed to plan.   Dg Lumbar Spine Complete  07/30/2013   CLINICAL DATA:  Low back pain  EXAM: LUMBAR SPINE - COMPLETE 4+ VIEW  COMPARISON:  None.  FINDINGS: Vertebral body height is well maintained. No significant spondylolysis or spondylolisthesis is noted. No significant osteophytic changes are noted. Changes of prior hip prosthesis are seen.  IMPRESSION: No acute abnormality noted.   Electronically Signed   By: Inez Catalina M.D.   On: 07/30/2013 14:58     EKG Interpretation None     Medications  ketorolac (TORADOL) 30 MG/ML injection 30 mg (30 mg Intramuscular Given 07/30/13 1421)  HYDROmorphone (DILAUDID) injection 1 mg (1 mg Intramuscular Given 07/30/13 1421)  diazepam (VALIUM) tablet 5 mg (5 mg Oral Given 07/30/13 1512)  HYDROmorphone (DILAUDID) injection 2 mg (2 mg Intramuscular Given 07/30/13 1709)   MDM   Final diagnoses:  Back pain of lumbar region with sciatica    Patient presents with back pain following an injury. Notable muscle spasm over the right paraspinous muscle region. No evidence of cauda equina. Plain films are negative. Patient was given pain medication and muscle relaxers. He was able to ambulate without difficulty but continues to endorse pain. Discussed with patient that this may be a herniated disc but given his reassuring neurologic exam, no indication  for emergent MRI.  Patient will be given pain medication and muscle relaxants. Patient is not to drive.  Patient was also given a Medrol Dosepak for anti-inflammatory properties. Patient followup with PCP. If pain persists or patient has new neurologic symptoms he needs to be reevaluated and may need MRI. Patient stated understanding.  After history, exam, and medical workup I feel the patient has been appropriately medically screened and is safe for discharge home. Pertinent diagnoses were discussed with the patient. Patient was given return precautions.  I personally performed the services described in this documentation, which was scribed in my presence. The recorded information has been reviewed and is accurate.    Merryl Hacker, MD 07/30/13 2206

## 2013-07-30 NOTE — Discharge Instructions (Signed)
Back Exercises Back exercises help treat and prevent back injuries. The goal is to increase your strength in your belly (abdominal) and back muscles. These exercises can also help with flexibility. Start these exercises when told by your doctor. HOME CARE Back exercises include: Pelvic Tilt.  Lie on your back with your knees bent. Tilt your pelvis until the lower part of your back is against the floor. Hold this position 5 to 10 sec. Repeat this exercise 5 to 10 times. Knee to Chest.  Pull 1 knee up against your chest and hold for 20 to 30 seconds. Repeat this with the other knee. This may be done with the other leg straight or bent, whichever feels better. Then, pull both knees up against your chest. Sit-Ups or Curl-Ups.  Bend your knees 90 degrees. Start with tilting your pelvis, and do a partial, slow sit-up. Only lift your upper half 30 to 45 degrees off the floor. Take at least 2 to 3 seonds for each sit-up. Do not do sit-ups with your knees out straight. If partial sit-ups are difficult, simply do the above but with only tightening your belly (abdominal) muscles and holding it as told. Hip-Lift.  Lie on your back with your knees flexed 90 degrees. Push down with your feet and shoulders as you raise your hips 2 inches off the floor. Hold for 10 seconds, repeat 5 to 10 times. Back Arches.  Lie on your stomach. Prop yourself up on bent elbows. Slowly press on your hands, causing an arch in your low back. Repeat 3 to 5 times. Shoulder-Lifts.  Lie face down with arms beside your body. Keep hips and belly pressed to floor as you slowly lift your head and shoulders off the floor. Do not overdo your exercises. Be careful in the beginning. Exercises may cause you some mild back discomfort. If the pain lasts for more than 15 minutes, stop the exercises until you see your doctor. Improvement with exercise for back problems is slow.  Document Released: 05/27/2010 Document Revised: 07/17/2011  Document Reviewed: 02/23/2011 [facility] Patient Information 2014 [place], [place]. Sciatica Sciatica is pain, weakness, numbness, or tingling along the path of the sciatic nerve. The nerve starts in the lower back and runs down the back of each leg. The nerve controls the muscles in the lower leg and in the back of the knee, while also providing sensation to the back of the thigh, lower leg, and the sole of your foot. Sciatica is a symptom of another medical condition. For instance, nerve damage or certain conditions, such as a herniated disk or bone spur on the spine, pinch or put pressure on the sciatic nerve. This causes the pain, weakness, or other sensations normally associated with sciatica. Generally, sciatica only affects one side of the body. CAUSES   Herniated or slipped disc.  Degenerative disk disease.  A pain disorder involving the narrow muscle in the buttocks (piriformis syndrome).  Pelvic injury or fracture.  Pregnancy.  Tumor (rare). SYMPTOMS  Symptoms can vary from mild to very severe. The symptoms usually travel from the low back to the buttocks and down the back of the leg. Symptoms can include:  Mild tingling or dull aches in the lower back, leg, or hip.  Numbness in the back of the calf or sole of the foot.  Burning sensations in the lower back, leg, or hip.  Sharp pains in the lower back, leg, or hip.  Leg weakness.  Severe back pain inhibiting movement. These symptoms may  get worse with coughing, sneezing, laughing, or prolonged sitting or standing. Also, being overweight may worsen symptoms. DIAGNOSIS  Your caregiver will perform a physical exam to look for common symptoms of sciatica. He or she may ask you to do certain movements or activities that would trigger sciatic nerve pain. Other tests may be performed to find the cause of the sciatica. These may include:  Blood tests.  X-rays.  Imaging tests, such as an MRI or CT scan. TREATMENT  Treatment is  directed at the cause of the sciatic pain. Sometimes, treatment is not necessary and the pain and discomfort goes away on its own. If treatment is needed, your caregiver may suggest:  Over-the-counter medicines to relieve pain.  Prescription medicines, such as anti-inflammatory medicine, muscle relaxants, or narcotics.  Applying heat or ice to the painful area.  Steroid injections to lessen pain, irritation, and inflammation around the nerve.  Reducing activity during periods of pain.  Exercising and stretching to strengthen your abdomen and improve flexibility of your spine. Your caregiver may suggest losing weight if the extra weight makes the back pain worse.  Physical therapy.  Surgery to eliminate what is pressing or pinching the nerve, such as a bone spur or part of a herniated disk. HOME CARE INSTRUCTIONS   Only take over-the-counter or prescription medicines for pain or discomfort as directed by your caregiver.  Apply ice to the affected area for 20 minutes, 3 4 times a day for the first 48 72 hours. Then try heat in the same way.  Exercise, stretch, or perform your usual activities if these do not aggravate your pain.  Attend physical therapy sessions as directed by your caregiver.  Keep all follow-up appointments as directed by your caregiver.  Do not wear high heels or shoes that do not provide proper support.  Check your mattress to see if it is too soft. A firm mattress may lessen your pain and discomfort. SEEK IMMEDIATE MEDICAL CARE IF:   You lose control of your bowel or bladder (incontinence).  You have increasing weakness in the lower back, pelvis, buttocks, or legs.  You have redness or swelling of your back.  You have a burning sensation when you urinate.  You have pain that gets worse when you lie down or awakens you at night.  Your pain is worse than you have experienced in the past.  Your pain is lasting longer than 4 weeks.  You are suddenly  losing weight without reason. MAKE SURE YOU:  Understand these instructions.  Will watch your condition.  Will get help right away if you are not doing well or get worse. Document Released: 04/18/2001 Document Revised: 10/24/2011 Document Reviewed: 09/03/2011 [facility] Patient Information 2014 [place]. Back Pain, Adult Low back pain is very common. About 1 in 5 people have back pain.The cause of low back pain is rarely dangerous. The pain often gets better over time.About half of people with a sudden onset of back pain feel better in just 2 weeks. About 8 in 10 people feel better by 6 weeks.  CAUSES Some common causes of back pain include:  Strain of the muscles or ligaments supporting the spine.  Wear and tear (degeneration) of the spinal discs.  Arthritis.  Direct injury to the back. DIAGNOSIS Most of the time, the direct cause of low back pain is not known.However, back pain can be treated effectively even when the exact cause of the pain is unknown.Answering your caregiver's questions about your overall health  and symptoms is one of the most accurate ways to make sure the cause of your pain is not dangerous. If your caregiver needs more information, he or she may order lab work or imaging tests (X-rays or MRIs).However, even if imaging tests show changes in your back, this usually does not require surgery. HOME CARE INSTRUCTIONS For many people, back pain returns.Since low back pain is rarely dangerous, it is often a condition that people can learn to [facility] their own.   Remain active. It is stressful on the back to sit or stand in one place. Do not sit, drive, or stand in one place for more than 30 minutes at a time. Take short walks on level surfaces as soon as pain allows.Try to increase the length of time you walk each day.  Do not stay in bed.Resting more than 1 or 2 days can delay your recovery.  Do not avoid exercise or work.Your body is made to move.It  is not dangerous to be active, even though your back may hurt.Your back will likely heal faster if you return to being active before your pain is gone.  Pay attention to your body when you bend and lift. Many people have less discomfortwhen lifting if they bend their knees, keep the load close to their bodies,and avoid twisting. Often, the most comfortable positions are those that put less stress on your recovering back.  Find a comfortable position to sleep. Use a firm mattress and lie on your side with your knees slightly bent. If you lie on your back, put a pillow under your knees.  Only take over-the-counter or prescription medicines as directed by your caregiver. Over-the-counter medicines to reduce pain and inflammation are often the most helpful.Your caregiver may prescribe muscle relaxant drugs.These medicines help dull your pain so you can more quickly return to your normal activities and healthy exercise.  Put ice on the injured area.  Put ice in a plastic bag.  Place a towel between your skin and the bag.  Leave the ice on for 15-20 minutes, 03-04 times a day for the first 2 to 3 days. After that, ice and heat may be alternated to reduce pain and spasms.  Ask your caregiver about trying back exercises and gentle massage. This may be of some benefit.  Avoid feeling anxious or stressed.Stress increases muscle tension and can worsen back pain.It is important to recognize when you are anxious or stressed and learn ways to manage it.Exercise is a great option. SEEK MEDICAL CARE IF:  You have pain that is not relieved with rest or medicine.  You have pain that does not improve in 1 week.  You have new symptoms.  You are generally not feeling well. SEEK IMMEDIATE MEDICAL CARE IF:   You have pain that radiates from your back into your legs.  You develop new bowel or bladder control problems.  You have unusual weakness or numbness in your arms or legs.  You develop  nausea or vomiting.  You develop abdominal pain.  You feel faint. Document Released: 04/24/2005 Document Revised: 10/24/2011 Document Reviewed: 09/12/2010 [facility] Patient Information 2014 [place], [place].

## 2013-10-12 ENCOUNTER — Encounter (HOSPITAL_COMMUNITY): Payer: Self-pay | Admitting: Emergency Medicine

## 2013-10-12 DIAGNOSIS — Z8719 Personal history of other diseases of the digestive system: Secondary | ICD-10-CM | POA: Insufficient documentation

## 2013-10-12 DIAGNOSIS — I10 Essential (primary) hypertension: Secondary | ICD-10-CM | POA: Insufficient documentation

## 2013-10-12 DIAGNOSIS — Z791 Long term (current) use of non-steroidal anti-inflammatories (NSAID): Secondary | ICD-10-CM | POA: Insufficient documentation

## 2013-10-12 DIAGNOSIS — G4730 Sleep apnea, unspecified: Secondary | ICD-10-CM | POA: Insufficient documentation

## 2013-10-12 DIAGNOSIS — E039 Hypothyroidism, unspecified: Secondary | ICD-10-CM | POA: Insufficient documentation

## 2013-10-12 DIAGNOSIS — Z79899 Other long term (current) drug therapy: Secondary | ICD-10-CM | POA: Insufficient documentation

## 2013-10-12 DIAGNOSIS — I2510 Atherosclerotic heart disease of native coronary artery without angina pectoris: Secondary | ICD-10-CM | POA: Insufficient documentation

## 2013-10-12 DIAGNOSIS — Z9981 Dependence on supplemental oxygen: Secondary | ICD-10-CM | POA: Insufficient documentation

## 2013-10-12 DIAGNOSIS — Z872 Personal history of diseases of the skin and subcutaneous tissue: Secondary | ICD-10-CM | POA: Insufficient documentation

## 2013-10-12 DIAGNOSIS — Z9889 Other specified postprocedural states: Secondary | ICD-10-CM | POA: Insufficient documentation

## 2013-10-12 DIAGNOSIS — Z8709 Personal history of other diseases of the respiratory system: Secondary | ICD-10-CM | POA: Insufficient documentation

## 2013-10-12 DIAGNOSIS — Z87891 Personal history of nicotine dependence: Secondary | ICD-10-CM | POA: Insufficient documentation

## 2013-10-12 DIAGNOSIS — G8929 Other chronic pain: Secondary | ICD-10-CM | POA: Insufficient documentation

## 2013-10-12 DIAGNOSIS — M167 Other unilateral secondary osteoarthritis of hip: Secondary | ICD-10-CM | POA: Insufficient documentation

## 2013-10-12 DIAGNOSIS — IMO0002 Reserved for concepts with insufficient information to code with codable children: Secondary | ICD-10-CM | POA: Insufficient documentation

## 2013-10-12 NOTE — ED Notes (Signed)
Patient c/o lower right sided back pain since last night.  Patient denies any injury or trauma.  Patient states he has had same back pain before, but it's been a while.

## 2013-10-13 ENCOUNTER — Emergency Department (HOSPITAL_COMMUNITY)
Admission: EM | Admit: 2013-10-13 | Discharge: 2013-10-13 | Disposition: A | Discharge status: Home / Self Care | Payer: 59 | Attending: Emergency Medicine | Admitting: Emergency Medicine

## 2013-10-13 DIAGNOSIS — M5416 Radiculopathy, lumbar region: Secondary | ICD-10-CM

## 2013-10-13 LAB — URINALYSIS, ROUTINE W REFLEX MICROSCOPIC
Bilirubin Urine: NEGATIVE
Glucose, UA: NEGATIVE mg/dL
Hgb urine dipstick: NEGATIVE
Ketones, ur: NEGATIVE mg/dL
Leukocytes, UA: NEGATIVE
Nitrite: NEGATIVE
Protein, ur: NEGATIVE mg/dL
Specific Gravity, Urine: 1.02 (ref 1.005–1.030)
Urobilinogen, UA: 0.2 mg/dL (ref 0.0–1.0)
pH: 6.5 (ref 5.0–8.0)

## 2013-10-13 MED ORDER — HYDROMORPHONE HCL PF 1 MG/ML IJ SOLN
1.0000 mg | Freq: Once | INTRAMUSCULAR | Status: AC
  Administered 2013-10-13: 1 mg via INTRAVENOUS
  Filled 2013-10-13: qty 1

## 2013-10-13 MED ORDER — METHOCARBAMOL 500 MG PO TABS: 1000.0000 mg | ORAL_TABLET | Freq: Three times a day (TID) | ORAL | Status: DC

## 2013-10-13 MED ORDER — ONDANSETRON HCL 4 MG/2ML IJ SOLN
4.0000 mg | Freq: Once | INTRAMUSCULAR | Status: AC
  Administered 2013-10-13: 4 mg via INTRAVENOUS
  Filled 2013-10-13: qty 2

## 2013-10-13 MED ORDER — PREDNISONE 20 MG PO TABS: ORAL_TABLET | ORAL | Status: DC

## 2013-10-13 MED ORDER — KETOROLAC TROMETHAMINE 30 MG/ML IJ SOLN
30.0000 mg | Freq: Once | INTRAMUSCULAR | Status: AC
  Administered 2013-10-13: 30 mg via INTRAVENOUS
  Filled 2013-10-13: qty 1

## 2013-10-13 MED ORDER — OXYCODONE-ACETAMINOPHEN 5-325 MG PO TABS: 1.0000 | ORAL_TABLET | ORAL | Status: DC | PRN

## 2013-10-13 NOTE — Discharge Instructions (Signed)

## 2013-10-13 NOTE — ED Provider Notes (Signed)
CSN: [id number]     Arrival date & time 10/12/13  2256 History   First MD Initiated Contact with Patient 10/13/13 0021     Chief Complaint  Patient presents with  . Back Pain     (Consider location/radiation/quality/duration/timing/severity/associated sxs/prior Treatment) Patient is a 48 y.o. male presenting with back pain. The history is provided by the patient.  Back Pain Location:  Lumbar spine Quality:  Aching and shooting Radiates to:  R posterior upper leg, R thigh, R knee and R foot Pain severity:  Moderate Worse during: pain is minimal at rest but severe upon standing or walking. Onset quality:  Sudden Duration:  1 day Chronicity:  Recurrent Context: not emotional stress, not lifting heavy objects, not recent injury and not twisting   Relieved by:  Bed rest and being still Worsened by:  Standing Ineffective treatments:  Muscle relaxants (OTC heat patch) Associated symptoms: leg pain and tingling   Associated symptoms: no abdominal pain, no abdominal swelling, no bladder incontinence, no bowel incontinence, no chest pain, no dysuria, no fever, no headaches, no numbness, no paresthesias, no pelvic pain, no perianal numbness and no weakness   Associated symptoms comment:  Intermittent tingling sensation to both legs and feet.    Past Medical History  Diagnosis Date  . CAD (coronary artery disease)   . Elevated liver enzymes   . Diverticulitis     Hx of; requiring 3 admissions  . Sigmoid colon ulcer     Rectal polyps  . Thyroid disease   . Psoriasis     Per medical history form dated 06/13/10.  . Chronic insomnia     Per medical history form dated 06/13/10.  . Colitis     Per medical history from dated 06/13/10.  Marland Kitchen Hypertension   . Hypothyroidism   . Bronchitis     history of  . Back pain     chronic  . Arthritis   . 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 Sleep apnea with use of continuous positive airway pressure (CPAP)    Past Surgical  History  Procedure Laterality Date  . Colonoscopy  07/2008    Colitis,NSAID v. Ischemia,malrotation of the gut,Diverticulosis(L),hyperplastic  . Appendectomy    . Shoulder surgery      Left  . Rotator cuff repair      Left - per medical history form dated 06/13/10.  . Thyroidectomy      Per medical history form dated 06/13/10.  . Colon surgery  2012  . Total hip arthroplasty  07/04/2011    Procedure: TOTAL HIP ARTHROPLASTY;  Surgeon: Johnny Bridge, MD;  Location: [facility];  Service: Orthopedics;  Laterality: Right;  . Joint replacement    . Cardiac catheterization  2009  . Cardiac catheterization      Per medical history from dated 06/13/10.  . Colonoscopy N/A 12/13/2012    Procedure: COLONOSCOPY;  Surgeon: Danie Binder, MD;  Location: [facility];  Service: Endoscopy;  Laterality: N/A;  10:00AM-rescheduled to 11:15 Doris notified pt   Family History  Problem Relation Age of Onset  . Cancer Mother     breast cancer - per medical history form dated 06/13/10.  . Diverticulitis Mother   . Hypertension Mother   . Cancer Father     skin - per medical history form dated 06/13/10.  Marland Kitchen Hypertension Father   . Anesthesia problems Neg Hx   . Hypotension Neg Hx   . Malignant hyperthermia Neg Hx   .  Pseudochol deficiency Neg Hx    History  Substance Use Topics  . Smoking status: Former Smoker -- 0.25 packs/day for 18 years  . Smokeless tobacco: Never Used     Comment: Has been wearing patch for 20 days  . Alcohol Use: Yes     Comment: Occasional    Review of Systems  Constitutional: Negative for fever.  Respiratory: Negative for shortness of breath.   Cardiovascular: Negative for chest pain.  Gastrointestinal: Negative for vomiting, abdominal pain, constipation and bowel incontinence.  Genitourinary: Negative for bladder incontinence, dysuria, hematuria, flank pain, decreased urine volume, difficulty urinating and pelvic pain.       No perineal numbness or incontinence of urine or feces   Musculoskeletal: Positive for back pain. Negative for joint swelling.  Skin: Negative for rash.  Neurological: Positive for tingling. Negative for weakness, numbness, headaches and paresthesias.  All other systems reviewed and are negative.     Allergies  Other  Home Medications   Prior to Admission medications   Medication Sig Start Date End Date Taking? Authorizing Provider  diazepam (VALIUM) 5 MG tablet Take 1 tablet (5 mg total) by mouth every 12 (twelve) hours as needed for muscle spasms. 07/30/13   Merryl Hacker, MD  EPINEPHrine (EPIPEN) 0.3 mg/0.3 mL SOAJ Inject 0.3 mLs (0.3 mg total) into the muscle as needed. 12/01/12   Teressa Lower, MD  ibuprofen (ADVIL,MOTRIN) 200 MG tablet Take 600 mg by mouth every 6 (six) hours as needed for pain.    Historical Provider, MD  levothyroxine (SYNTHROID, LEVOTHROID) 300 MCG tablet Take 300 mcg by mouth daily at 3 pm.    Historical Provider, MD  methylPREDNIsolone (MEDROL DOSPACK) 4 MG tablet follow package directions 07/30/13   Merryl Hacker, MD  nitroGLYCERIN (NITROSTAT) 0.4 MG SL tablet Place 0.4 mg under the tongue every 5 (five) minutes x 3 doses as needed for chest pain. 11/29/11 11/28/13  Kathie Dike, MD  olmesartan (BENICAR) 20 MG tablet Take 20 mg by mouth every evening.     Historical Provider, MD  oxyCODONE-acetaminophen (PERCOCET/ROXICET) 5-325 MG per tablet Take 1-2 tablets by mouth every 6 (six) hours as needed for severe pain. 07/30/13   Merryl Hacker, MD  ranitidine (ZANTAC) 75 MG tablet Take 75 mg by mouth daily.    Historical Provider, MD  zolpidem (AMBIEN) 10 MG tablet Take 10 mg by mouth every morning.     Historical Provider, MD   BP 130/86  Pulse 72  Temp(Src) 98.1 F (36.7 C) (Oral)  Resp 18  Ht 6\' 1"  (1.854 m)  Wt 232 lb (105.235 kg)  BMI 30.62 kg/m2  SpO2 95% Physical Exam  Nursing note and vitals reviewed. Constitutional: He is oriented to person, place, and time. He appears well-developed and  well-nourished. No distress.  HENT:  Head: Normocephalic and atraumatic.  Neck: Normal range of motion. Neck supple.  Cardiovascular: Normal rate, regular rhythm, normal heart sounds and intact distal pulses.   No murmur heard. Pulmonary/Chest: Effort normal and breath sounds normal. No respiratory distress. He exhibits no tenderness.  Abdominal: Soft. He exhibits no distension. There is no tenderness.  Musculoskeletal: He exhibits tenderness. He exhibits no edema.       Lumbar back: He exhibits tenderness and pain. He exhibits normal range of motion, no swelling, no deformity, no laceration and normal pulse.  ttp of the right lumbar paraspinal muscles with spasm present.  No spinal tenderness.  No clonus.  DP pulses are brisk and  symmetrical.  Distal sensation intact.  Hip Flexors/Extensors are intact.  Pt has 5/5 strength against resistance of bilateral lower extremities.     Neurological: He is alert and oriented to person, place, and time. He has normal strength. No sensory deficit. He exhibits normal muscle tone. Coordination and gait normal.  Reflex Scores:      Patellar reflexes are 2+ on the right side and 2+ on the left side.      Achilles reflexes are 2+ on the right side and 2+ on the left side. Skin: Skin is warm and dry. No rash noted.    ED Course  Procedures (including critical care time) Labs Review Labs Reviewed  URINALYSIS, ROUTINE W REFLEX MICROSCOPIC    Imaging Review No results found.   EKG Interpretation None      MDM   Final diagnoses:  Lumbar radicular pain    Previous ED chart reviewed.  Pt had LS spine films from 3/15 that showed no acute bony abnormality  Patient with sudden onset of right lower back pain that began last evening after standing from a seated position.  No trauma.  Has h/o chronic back pain with a similar exacerbation of pain in March of this year to which he states the pain tonight is in same location as previous.  VSS.  Patient is  ambulatory w/o focal neuro deficits.  No concerning sx's for emergent neurological or infectious process.   Pain improving after IV medications, nausea resolved.  Patient is feeling better and appears stable for d/c.  He agrees to close f/u with his PMD this week for recheck.     Alan Riles L. Barbarann Kelly, PA-C 10/13/13 0153

## 2013-10-13 NOTE — ED Provider Notes (Signed)
Medical screening examination/treatment/procedure(s) were performed by non-physician practitioner and as supervising physician I was immediately available for consultation/collaboration.   EKG Interpretation None        Mariea Clonts, MD 10/13/13 [phone]

## 2013-11-24 ENCOUNTER — Ambulatory Visit (HOSPITAL_COMMUNITY)
Admission: RE | Admit: 2013-11-24 | Discharge: 2013-11-24 | Disposition: A | Discharge status: Home / Self Care | Payer: 59 | Source: Ambulatory Visit | Attending: Family Medicine | Admitting: Family Medicine

## 2013-11-24 ENCOUNTER — Other Ambulatory Visit (HOSPITAL_COMMUNITY): Payer: Self-pay | Admitting: Family Medicine

## 2013-11-24 ENCOUNTER — Encounter (INDEPENDENT_AMBULATORY_CARE_PROVIDER_SITE_OTHER): Payer: Self-pay

## 2013-11-24 DIAGNOSIS — M4120 Other idiopathic scoliosis, site unspecified: Secondary | ICD-10-CM | POA: Insufficient documentation

## 2013-11-24 DIAGNOSIS — M549 Dorsalgia, unspecified: Secondary | ICD-10-CM

## 2013-11-24 DIAGNOSIS — M545 Low back pain, unspecified: Secondary | ICD-10-CM | POA: Insufficient documentation

## 2013-11-24 DIAGNOSIS — R109 Unspecified abdominal pain: Secondary | ICD-10-CM

## 2013-11-26 ENCOUNTER — Other Ambulatory Visit (HOSPITAL_COMMUNITY): Payer: Self-pay | Admitting: Family Medicine

## 2013-11-26 DIAGNOSIS — M549 Dorsalgia, unspecified: Secondary | ICD-10-CM

## 2013-12-01 ENCOUNTER — Ambulatory Visit (HOSPITAL_COMMUNITY)
Admission: RE | Admit: 2013-12-01 | Discharge: 2013-12-01 | Disposition: A | Discharge status: Home / Self Care | Payer: 59 | Source: Ambulatory Visit | Attending: Family Medicine | Admitting: Family Medicine

## 2013-12-01 ENCOUNTER — Encounter (HOSPITAL_COMMUNITY): Payer: Self-pay

## 2013-12-01 DIAGNOSIS — M549 Dorsalgia, unspecified: Secondary | ICD-10-CM | POA: Insufficient documentation

## 2014-03-21 ENCOUNTER — Encounter (HOSPITAL_COMMUNITY): Payer: Self-pay

## 2014-03-21 ENCOUNTER — Inpatient Hospital Stay (HOSPITAL_COMMUNITY)
Admission: EM | Admit: 2014-03-21 | Discharge: 2014-03-21 | DRG: 069 | Disposition: A | Discharge status: Home / Self Care | Payer: 59 | Attending: Internal Medicine | Admitting: Internal Medicine

## 2014-03-21 ENCOUNTER — Emergency Department (HOSPITAL_COMMUNITY): Payer: 59

## 2014-03-21 ENCOUNTER — Inpatient Hospital Stay (HOSPITAL_COMMUNITY): Payer: 59

## 2014-03-21 DIAGNOSIS — E669 Obesity, unspecified: Secondary | ICD-10-CM | POA: Diagnosis present

## 2014-03-21 DIAGNOSIS — Z96641 Presence of right artificial hip joint: Secondary | ICD-10-CM | POA: Diagnosis present

## 2014-03-21 DIAGNOSIS — L409 Psoriasis, unspecified: Secondary | ICD-10-CM | POA: Diagnosis present

## 2014-03-21 DIAGNOSIS — Z8249 Family history of ischemic heart disease and other diseases of the circulatory system: Secondary | ICD-10-CM | POA: Diagnosis not present

## 2014-03-21 DIAGNOSIS — I10 Essential (primary) hypertension: Secondary | ICD-10-CM

## 2014-03-21 DIAGNOSIS — R0600 Dyspnea, unspecified: Secondary | ICD-10-CM

## 2014-03-21 DIAGNOSIS — R208 Other disturbances of skin sensation: Secondary | ICD-10-CM

## 2014-03-21 DIAGNOSIS — Z87891 Personal history of nicotine dependence: Secondary | ICD-10-CM | POA: Diagnosis not present

## 2014-03-21 DIAGNOSIS — M549 Dorsalgia, unspecified: Secondary | ICD-10-CM | POA: Diagnosis present

## 2014-03-21 DIAGNOSIS — Z803 Family history of malignant neoplasm of breast: Secondary | ICD-10-CM | POA: Diagnosis not present

## 2014-03-21 DIAGNOSIS — I639 Cerebral infarction, unspecified: Secondary | ICD-10-CM | POA: Diagnosis present

## 2014-03-21 DIAGNOSIS — I6330 Cerebral infarction due to thrombosis of unspecified cerebral artery: Secondary | ICD-10-CM | POA: Insufficient documentation

## 2014-03-21 DIAGNOSIS — R29810 Facial weakness: Secondary | ICD-10-CM | POA: Diagnosis present

## 2014-03-21 DIAGNOSIS — I2510 Atherosclerotic heart disease of native coronary artery without angina pectoris: Secondary | ICD-10-CM | POA: Diagnosis present

## 2014-03-21 DIAGNOSIS — E890 Postprocedural hypothyroidism: Secondary | ICD-10-CM | POA: Diagnosis present

## 2014-03-21 DIAGNOSIS — E039 Hypothyroidism, unspecified: Secondary | ICD-10-CM | POA: Insufficient documentation

## 2014-03-21 DIAGNOSIS — E119 Type 2 diabetes mellitus without complications: Secondary | ICD-10-CM | POA: Diagnosis present

## 2014-03-21 DIAGNOSIS — Z91038 Other insect allergy status: Secondary | ICD-10-CM

## 2014-03-21 DIAGNOSIS — G8929 Other chronic pain: Secondary | ICD-10-CM | POA: Diagnosis present

## 2014-03-21 DIAGNOSIS — G459 Transient cerebral ischemic attack, unspecified: Secondary | ICD-10-CM | POA: Diagnosis not present

## 2014-03-21 DIAGNOSIS — Z6830 Body mass index (BMI) 30.0-30.9, adult: Secondary | ICD-10-CM | POA: Diagnosis not present

## 2014-03-21 DIAGNOSIS — R0902 Hypoxemia: Secondary | ICD-10-CM | POA: Diagnosis present

## 2014-03-21 DIAGNOSIS — G4733 Obstructive sleep apnea (adult) (pediatric): Secondary | ICD-10-CM | POA: Diagnosis present

## 2014-03-21 DIAGNOSIS — R209 Unspecified disturbances of skin sensation: Secondary | ICD-10-CM

## 2014-03-21 DIAGNOSIS — I517 Cardiomegaly: Secondary | ICD-10-CM

## 2014-03-21 DIAGNOSIS — R0789 Other chest pain: Secondary | ICD-10-CM

## 2014-03-21 DIAGNOSIS — E785 Hyperlipidemia, unspecified: Secondary | ICD-10-CM | POA: Diagnosis present

## 2014-03-21 DIAGNOSIS — G4700 Insomnia, unspecified: Secondary | ICD-10-CM | POA: Diagnosis present

## 2014-03-21 LAB — LIPID PANEL
Cholesterol: 183 mg/dL (ref 0–200)
HDL: 26 mg/dL — ABNORMAL LOW (ref >39)
LDL Cholesterol: 79 mg/dL (ref 0–99)
Total CHOL/HDL Ratio: 7 RATIO
Triglycerides: 390 mg/dL — ABNORMAL HIGH (ref <150)
VLDL: 78 mg/dL — ABNORMAL HIGH (ref 0–40)

## 2014-03-21 LAB — HEMOGLOBIN A1C
Hgb A1c MFr Bld: 5.3 % (ref <5.7)
Mean Plasma Glucose: 105 mg/dL (ref <117)

## 2014-03-21 LAB — DIFFERENTIAL
Basophils Absolute: 0.1 10*3/uL (ref 0.0–0.1)
Basophils Relative: 1 % (ref 0–1)
Eosinophils Absolute: 0.2 10*3/uL (ref 0.0–0.7)
Eosinophils Relative: 2 % (ref 0–5)
Lymphocytes Relative: 27 % (ref 12–46)
Lymphs Abs: 2.6 10*3/uL (ref 0.7–4.0)
Monocytes Absolute: 0.5 10*3/uL (ref 0.1–1.0)
Monocytes Relative: 5 % (ref 3–12)
Neutro Abs: 6.4 10*3/uL (ref 1.7–7.7)
Neutrophils Relative %: 65 % (ref 43–77)

## 2014-03-21 LAB — COMPREHENSIVE METABOLIC PANEL
ALT: 54 U/L — ABNORMAL HIGH (ref 0–53)
AST: 32 U/L (ref 0–37)
Albumin: 4.5 g/dL (ref 3.5–5.2)
Alkaline Phosphatase: 97 U/L (ref 39–117)
Anion gap: 15 (ref 5–15)
BUN: 14 mg/dL (ref 6–23)
CO2: 24 mEq/L (ref 19–32)
Calcium: 9.6 mg/dL (ref 8.4–10.5)
Chloride: 97 mEq/L (ref 96–112)
Creatinine, Ser: 0.95 mg/dL (ref 0.50–1.35)
GFR calc Af Amer: >90 mL/min (ref >90)
GFR calc non Af Amer: >90 mL/min (ref >90)
Glucose, Bld: 107 mg/dL — ABNORMAL HIGH (ref 70–99)
Potassium: 4.1 mEq/L (ref 3.7–5.3)
Sodium: 136 mEq/L — ABNORMAL LOW (ref 137–147)
Total Bilirubin: 0.4 mg/dL (ref 0.3–1.2)
Total Protein: 7.8 g/dL (ref 6.0–8.3)

## 2014-03-21 LAB — CBC
HCT: 47.9 % (ref 39.0–52.0)
Hemoglobin: 17 g/dL (ref 13.0–17.0)
MCH: 33.1 pg (ref 26.0–34.0)
MCHC: 35.5 g/dL (ref 30.0–36.0)
MCV: 93.2 fL (ref 78.0–100.0)
Platelets: 276 10*3/uL (ref 150–400)
RBC: 5.14 MIL/uL (ref 4.22–5.81)
RDW: 12.6 % (ref 11.5–15.5)
WBC: 9.8 10*3/uL (ref 4.0–10.5)

## 2014-03-21 LAB — URINALYSIS, ROUTINE W REFLEX MICROSCOPIC
Bilirubin Urine: NEGATIVE
Glucose, UA: NEGATIVE mg/dL
Hgb urine dipstick: NEGATIVE
Ketones, ur: NEGATIVE mg/dL
Leukocytes, UA: NEGATIVE
Nitrite: NEGATIVE
Protein, ur: NEGATIVE mg/dL
Specific Gravity, Urine: 1.014 (ref 1.005–1.030)
Urobilinogen, UA: 0.2 mg/dL (ref 0.0–1.0)
pH: 6 (ref 5.0–8.0)

## 2014-03-21 LAB — T4, FREE: Free T4: 0.69 ng/dL — ABNORMAL LOW (ref 0.80–1.80)

## 2014-03-21 LAB — I-STAT CHEM 8, ED
BUN: 16 mg/dL (ref 6–23)
Calcium, Ion: 1.12 mmol/L (ref 1.12–1.23)
Chloride: 103 mEq/L (ref 96–112)
Creatinine, Ser: 1 mg/dL (ref 0.50–1.35)
Glucose, Bld: 108 mg/dL — ABNORMAL HIGH (ref 70–99)
HCT: 50 % (ref 39.0–52.0)
Hemoglobin: 17 g/dL (ref 13.0–17.0)
Potassium: 3.9 mEq/L (ref 3.7–5.3)
Sodium: 138 mEq/L (ref 137–147)
TCO2: 26 mmol/L (ref 0–100)

## 2014-03-21 LAB — RAPID URINE DRUG SCREEN, HOSP PERFORMED
Amphetamines: NOT DETECTED
Barbiturates: NOT DETECTED
Benzodiazepines: NOT DETECTED
Cocaine: NOT DETECTED
Opiates: NOT DETECTED
Tetrahydrocannabinol: NOT DETECTED

## 2014-03-21 LAB — ETHANOL: Alcohol, Ethyl (B): <11 mg/dL (ref 0–11)

## 2014-03-21 LAB — VITAMIN B12: Vitamin B-12: 359 pg/mL (ref 211–911)

## 2014-03-21 LAB — I-STAT TROPONIN, ED: Troponin i, poc: 0.02 ng/mL (ref 0.00–0.08)

## 2014-03-21 LAB — APTT: aPTT: 32 seconds (ref 24–37)

## 2014-03-21 LAB — TROPONIN I
Troponin I: <0.3 ng/mL (ref <0.30)
Troponin I: <0.3 ng/mL (ref <0.30)

## 2014-03-21 LAB — TSH: TSH: 16.88 u[IU]/mL — ABNORMAL HIGH (ref 0.350–4.500)

## 2014-03-21 LAB — PROTIME-INR
INR: 1.01 (ref 0.00–1.49)
Prothrombin Time: 13.4 seconds (ref 11.6–15.2)

## 2014-03-21 MED ORDER — ASPIRIN 325 MG PO TABS
325.0000 mg | ORAL_TABLET | Freq: Every day | ORAL | Status: DC
Start: 2014-03-21 — End: 2014-07-10

## 2014-03-21 MED ORDER — IRBESARTAN 150 MG PO TABS: 150.0000 mg | ORAL_TABLET | Freq: Every day | ORAL | Status: DC

## 2014-03-21 MED ORDER — SODIUM CHLORIDE 0.9 % IV SOLN
INTRAVENOUS | Status: AC
  Administered 2014-03-21: 125 mL/h via INTRAVENOUS

## 2014-03-21 MED ORDER — LORAZEPAM 2 MG/ML IJ SOLN
1.0000 mg | Freq: Once | INTRAMUSCULAR | Status: AC
  Administered 2014-03-21: 1 mg via INTRAVENOUS
  Filled 2014-03-21: qty 1

## 2014-03-21 MED ORDER — ENOXAPARIN SODIUM 40 MG/0.4ML ~~LOC~~ SOLN
40.0000 mg | SUBCUTANEOUS | Status: DC
  Administered 2014-03-21: 40 mg via SUBCUTANEOUS
  Filled 2014-03-21: qty 0.4

## 2014-03-21 MED ORDER — ACETAMINOPHEN 325 MG PO TABS: 650.0000 mg | ORAL_TABLET | ORAL | Status: DC | PRN

## 2014-03-21 MED ORDER — ASPIRIN 300 MG RE SUPP: 300.0000 mg | Freq: Every day | RECTAL | Status: DC

## 2014-03-21 MED ORDER — ASPIRIN 325 MG PO TABS
325.0000 mg | ORAL_TABLET | Freq: Every day | ORAL | Status: DC
  Administered 2014-03-21: 325 mg via ORAL
  Filled 2014-03-21: qty 1

## 2014-03-21 MED ORDER — ONDANSETRON HCL 4 MG/2ML IJ SOLN
4.0000 mg | Freq: Once | INTRAMUSCULAR | Status: AC
  Administered 2014-03-21: 4 mg via INTRAVENOUS
  Filled 2014-03-21: qty 2

## 2014-03-21 MED ORDER — ACETAMINOPHEN 650 MG RE SUPP: 650.0000 mg | RECTAL | Status: DC | PRN

## 2014-03-21 MED ORDER — ASPIRIN EC 81 MG PO TBEC: 81.0000 mg | DELAYED_RELEASE_TABLET | Freq: Every day | ORAL | Status: DC

## 2014-03-21 MED ORDER — STROKE: EARLY STAGES OF RECOVERY BOOK
Freq: Once | Status: AC
  Administered 2014-03-21: 10:00:00
  Filled 2014-03-21: qty 1

## 2014-03-21 MED ORDER — LEVOTHYROXINE SODIUM 25 MCG PO TABS
25.0000 ug | ORAL_TABLET | Freq: Every day | ORAL | Status: DC
  Administered 2014-03-21: 25 ug via ORAL
  Filled 2014-03-21: qty 1

## 2014-03-21 MED ORDER — LEVOTHYROXINE SODIUM 125 MCG PO TABS: 250.0000 ug | ORAL_TABLET | Freq: Every day | ORAL | Status: DC

## 2014-03-21 MED ORDER — ONDANSETRON HCL 4 MG/2ML IJ SOLN: 4.0000 mg | Freq: Four times a day (QID) | INTRAMUSCULAR | Status: DC | PRN

## 2014-03-21 NOTE — ED Notes (Addendum)
Pt reports sudden onset of Left side numbness starting at 0215. Pt reports he ran out of his synthroid 5 days ago and his PCP changed his Synthroid from 300 mcg to 0.25 mcg.

## 2014-03-21 NOTE — Progress Notes (Signed)
STROKE TEAM PROGRESS NOTE   HISTORY  Andrew Love is an 48 y.o. male with a past medical history significant for HTN, CAD, OSA on CPAP, hypothyroidism, brought in via ambulance as a code stroke due to acute onset of left sided numbness. Patient said that he was on lunch break at work around [place] when suddenly he felt nauseated, dizzy, and the left face-arm-leg became numb and tingly, Stated that the left leg was " kind of heavy, although I was able to move everything". No associated HA, vertigo, double vision, difficulty swallowing, slurred speech, language or vision impairment. Initial NIHSS 1. CT brain showed no acute intracranial abnormality. Patient believes that he is feeling this way because of changes in his thyroid medications.  Date last known well:  Time last known well:  tPA Given: no, minimal sensory deficits NIHSS: 1  SUBJECTIVE (INTERVAL HISTORY) Echo tech at the bedside. The patient's wife later arrived. The patient feels his symptoms are much improved however he still has mild left upper extremity numbness. The patient reports that he recently came off prednisone for back pain. Also he recently had his dose of Synthroid changed from 38mcg to only 29mcg.   OBJECTIVE Temp:  [97.7 F (36.5 C)-98.2 F (36.8 C)] 97.7 F (36.5 C) (11/14 1009) Pulse Rate:  [56-69] 69 (11/14 1009) Cardiac Rhythm:  [-]  Resp:  [12-22] 16 (11/14 1009) BP: (116-162)/(66-87) 130/78 mmHg (11/14 1009) SpO2:  [88 %-100 %] 96 % (11/14 1009) Weight:  [231 lb 7.7 oz (105 kg)] 231 lb 7.7 oz (105 kg) (11/14 0348)  No results for input(s): GLUCAP in the last 168 hours.  Recent Labs Lab 03/21/14 0348 03/21/14 0400  NA 136* 138  K 4.1 3.9  CL 97 103  CO2 24  --   GLUCOSE 107* 108*  BUN 14 16  CREATININE 0.95 1.00  CALCIUM 9.6  --     Recent Labs Lab 03/21/14 0348  AST 32  ALT 54*  ALKPHOS 97  BILITOT 0.4  PROT 7.8  ALBUMIN 4.5    Recent Labs Lab 03/21/14 0348 03/21/14 0400   WBC 9.8  --   NEUTROABS 6.4  --   HGB 17.0 17.0  HCT 47.9 50.0  MCV 93.2  --   PLT 276  --     Recent Labs Lab 03/21/14 0858 03/21/14 1500  TROPONINI <0.30 <0.30    Recent Labs  03/21/14 0348  LABPROT 13.4  INR 1.01    Recent Labs  03/21/14 0528  COLORURINE YELLOW  LABSPEC 1.014  PHURINE 6.0  GLUCOSEU NEGATIVE  HGBUR NEGATIVE  BILIRUBINUR NEGATIVE  KETONESUR NEGATIVE  PROTEINUR NEGATIVE  UROBILINOGEN 0.2  NITRITE NEGATIVE  LEUKOCYTESUR NEGATIVE       Component Value Date/Time   CHOL 183 03/21/2014 0856   TRIG 390* 03/21/2014 0856   HDL 26* 03/21/2014 0856   CHOLHDL 7.0 03/21/2014 0856   VLDL 78* 03/21/2014 0856   LDLCALC 79 03/21/2014 0856   No results found for: HGBA1C    Component Value Date/Time   LABOPIA NONE DETECTED 03/21/2014 0528   COCAINSCRNUR NONE DETECTED 03/21/2014 0528   LABBENZ NONE DETECTED 03/21/2014 0528   AMPHETMU NONE DETECTED 03/21/2014 0528   THCU NONE DETECTED 03/21/2014 0528   LABBARB NONE DETECTED 03/21/2014 0528     Recent Labs Lab 03/21/14 0348  ETH <11   I have personally reviewed the radiological images below and agree with the radiology interpretations.  2 D echocardiogram 03/21/2014 - Left ventricle: The  cavity size was normal. There was mild concentric hypertrophy. Systolic function was normal. The estimated ejection fraction was in the range of 60% to 65%. Wall motion was normal; there were no regional wall motion abnormalities. - Mitral valve: There was trivial regurgitation.  Ct Head Wo Contrast 03/21/2014    No acute intracranial pathology.    MRI / MRA 03/21/2014 Normal MRI of the brain and normal intracranial MR angiography. No cause of the presenting symptoms is identified.  CUS - Bilateral: 1-39% ICA stenosis. Vertebral artery flow is antegrade.   PHYSICAL EXAM Physical exam  Temp:  [97.7 F (36.5 C)-98.2 F (36.8 C)] 97.7 F (36.5 C) (11/14 1009) Pulse Rate:  [56-69] 69 (11/14  1009) Resp:  [12-22] 16 (11/14 1009) BP: (116-162)/(66-87) 130/78 mmHg (11/14 1009) SpO2:  [88 %-100 %] 96 % (11/14 1009) Weight:  [231 lb 7.7 oz (105 kg)] 231 lb 7.7 oz (105 kg) (11/14 0348)  General - Well nourished, well developed, in no apparent distress.  Ophthalmologic - Sharp disc margins OU.  Cardiovascular - Regular rate and rhythm with no murmur.  Mental Status -  Level of arousal and orientation to time, place, and person were intact. Language including expression, naming, repetition, comprehension, reading, and writing was assessed and found intact. Attention span and concentration were normal. Recent and remote memory were intact. Fund of Knowledge was assessed and was intact.  Cranial Nerves II - XII - II - Visual field intact OU. III, IV, VI - Extraocular movements intact. V - Facial sensation intact bilaterally. VII - Facial movement intact bilaterally. VIII - Hearing & vestibular intact bilaterally. X - Palate elevates symmetrically. XI - Chin turning & shoulder shrug intact bilaterally. XII - Tongue protrusion intact.  Motor Strength - The patient's strength was normal in all extremities and pronator drift was absent.  Bulk was normal and fasciculations were absent.   Motor Tone - Muscle tone was assessed at the neck and appendages and was normal.  Reflexes - The patient's reflexes were normal in all extremities and he had no pathological reflexes.  Sensory - Light touch, temperature/pinprick were assessed and showed patchy decreased on left arm and left toes.    Coordination - The patient had normal movements in the hands and feet with no ataxia or dysmetria.  Tremor was absent.  Gait and Station - deferred due to in the process of 2D echo testing.   ASSESSMENT/PLAN Mr. Andrew Love is a 48 y.o. male with history of hypothyroidism, coronary artery disease, hypertension, chronic back pain, and obstructive sleep apnea presenting with left-sided numbness  and heaviness. He did not receive IV t-PA due to minimal deficits.   TIA vs. Hypothyroidism-related - subjective patchy numbness not consistent with stroke pattern, may be related to hypothyroidism. however due to stroke risk factors, will treat as TIA.  Resultant  Resolving deficits, but patchy subjective numbness left arm and the left toe  MRI  unremarkable  MRA  unremarkable  Carotid Doppler  Bilateral carotid artery duplex completed: 1-39% ICA stenosis. Vertebral artery flow is antegrade.  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   Coronary artery disease  Obstructive sleep apnea, on CPAP at home  Other Active Problems  Elevated triglycerides  Other Pertinent History  History of elevated liver enzymes  History of hypothyroidism with recent medication change  History of chronic back pain  Hospital day # 0  Mikey Bussing PA-C [facility] Pager [phone] 03/21/2014, 6:56 PM  I, the attending vascular neurologist, have personally  obtained a history, examined the patient, evaluated laboratory data, individually viewed imaging studies. Together with the NP/PA, we formulated the assessment and plan of care which reflects our mutual decision.  I have made any additions or clarifications directly to the above note and agree with the findings and plan as currently documented.   Rosalin Hawking, MD PhD Stroke Neurology 03/21/2014 7:05 PM   To contact Stroke Continuity provider, please refer to [email or web address] After hours, contact General Neurology

## 2014-03-21 NOTE — Progress Notes (Signed)
Echo Lab  2D Echocardiogram completed.  [place]lag[place], RDCS 03/21/2014 10:06 AM

## 2014-03-21 NOTE — Plan of Care (Signed)
Problem: Consults Goal: Ischemic Stroke Patient Education See Patient Education Module for education specifics.  Outcome: Completed/Met Date Met:  03/21/14

## 2014-03-21 NOTE — ED Notes (Signed)
Patient transported to CT with Cruzita Lederer

## 2014-03-21 NOTE — ED Provider Notes (Signed)
CSN: [id number]     Arrival date & time 03/21/14  2671 History   First MD Initiated Contact with Patient 03/21/14 [phone]     Chief Complaint  Patient presents with  . Facial Droop  . Numbness    @EDPCLEARED @ (Consider location/radiation/quality/duration/timing/severity/associated sxs/prior Treatment) HPI Patient presents with acute onset left sided numbness and weakness. States symptoms began around 2:15. Patient was working when he became nauseated with dizziness. He then complains of tingling and numbness to the left lower face and left arm and left leg. He also has had heaviness to the left leg. No visual changes. Denies chest pain, shortness of breath or palpitations. Past Medical History  Diagnosis Date  . CAD (coronary artery disease)   . Elevated liver enzymes   . Diverticulitis     Hx of; requiring 3 admissions  . Sigmoid colon ulcer     Rectal polyps  . Thyroid disease   . Psoriasis     Per medical history form dated 06/13/10.  . Chronic insomnia     Per medical history form dated 06/13/10.  . Colitis     Per medical history from dated 06/13/10.  Marland Kitchen Hypertension   . Hypothyroidism   . Bronchitis     history of  . Back pain     chronic  . Arthritis   . 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 Sleep apnea with use of continuous positive airway pressure (CPAP)    Past Surgical History  Procedure Laterality Date  . Colonoscopy  07/2008    Colitis,NSAID v. Ischemia,malrotation of the gut,Diverticulosis(L),hyperplastic  . Appendectomy    . Shoulder surgery      Left  . Rotator cuff repair      Left - per medical history form dated 06/13/10.  . Thyroidectomy      Per medical history form dated 06/13/10.  . Colon surgery  2012  . Total hip arthroplasty  07/04/2011    Procedure: TOTAL HIP ARTHROPLASTY;  Surgeon: Johnny Bridge, MD;  Location: [facility];  Service: Orthopedics;  Laterality: Right;  . Joint replacement    . Cardiac catheterization  2009  .  Cardiac catheterization      Per medical history from dated 06/13/10.  . Colonoscopy N/A 12/13/2012    Procedure: COLONOSCOPY;  Surgeon: Danie Binder, MD;  [facility];  Service: Endoscopy;  Laterality: N/A;  10:00AM-rescheduled to 11:15 Doris notified pt   Family History  Problem Relation Age of Onset  . Cancer Mother     breast cancer - per medical history form dated 06/13/10.  . Diverticulitis Mother   . Hypertension Mother   . Cancer Father     skin - per medical history form dated 06/13/10.  Marland Kitchen Hypertension Father   . Anesthesia problems Neg Hx   . Hypotension Neg Hx   . Malignant hyperthermia Neg Hx   . Pseudochol deficiency Neg Hx    History  Substance Use Topics  . Smoking status: Former Smoker -- 0.25 packs/day for 18 years  . Smokeless tobacco: Never Used     Comment: Has been wearing patch for 20 days  . Alcohol Use: Yes     Comment: Occasional    Review of Systems  Constitutional: Negative for fever and chills.  Eyes: Negative for visual disturbance.  Respiratory: Negative for cough and shortness of breath.   Cardiovascular: Negative for chest pain.  Gastrointestinal: Positive for nausea. Negative for vomiting, abdominal pain and diarrhea.  Musculoskeletal: Negative for back pain, neck pain and neck stiffness.  Skin: Negative for rash and wound.  Neurological: Positive for dizziness, weakness, light-headedness and numbness. Negative for syncope and headaches.  All other systems reviewed and are negative.     Allergies  Other  Home Medications   Prior to Admission medications   Medication Sig Start Date End Date Taking? Authorizing Provider  diazepam (VALIUM) 5 MG tablet Take 1 tablet (5 mg total) by mouth every 12 (twelve) hours as needed for muscle spasms. 07/30/13  Yes Merryl Hacker, MD  EPINEPHrine (EPIPEN) 0.3 mg/0.3 mL SOAJ Inject 0.3 mLs (0.3 mg total) into the muscle as needed. Patient taking differently: Inject 0.3 mg into the muscle  daily as needed (allergic reaction).  12/01/12  Yes Teressa Lower, MD  ibuprofen (ADVIL,MOTRIN) 200 MG tablet Take 600 mg by mouth every 6 (six) hours as needed for pain.   Yes Historical Provider, MD  levothyroxine (SYNTHROID, LEVOTHROID) 25 MCG tablet Take 25 mcg by mouth daily before breakfast.   Yes Historical Provider, MD  methocarbamol (ROBAXIN) 500 MG tablet Take 2 tablets (1,000 mg total) by mouth 3 (three) times daily. Prn muscle spasms 10/13/13  Yes Tammy L. Triplett, PA-C  olmesartan (BENICAR) 20 MG tablet Take 20 mg by mouth every evening.    Yes Historical Provider, MD  oxyCODONE-acetaminophen (PERCOCET/ROXICET) 5-325 MG per tablet Take 1-2 tablets by mouth every 6 (six) hours as needed for severe pain. 07/30/13  Yes Merryl Hacker, MD  zolpidem (AMBIEN) 10 MG tablet Take 10 mg by mouth every morning.    Yes Historical Provider, MD  levothyroxine (SYNTHROID, LEVOTHROID) 300 MCG tablet Take 300 mcg by mouth daily at 3 pm.    Historical Provider, MD  methylPREDNIsolone (MEDROL DOSPACK) 4 MG tablet follow package directions Patient not taking: Reported on 03/21/2014 07/30/13   Merryl Hacker, MD  nitroGLYCERIN (NITROSTAT) 0.4 MG SL tablet Place 0.4 mg under the tongue every 5 (five) minutes x 3 doses as needed for chest pain. 11/29/11 11/28/13  Kathie Dike, MD  oxyCODONE-acetaminophen (PERCOCET/ROXICET) 5-325 MG per tablet Take 1 tablet by mouth every 4 (four) hours as needed for severe pain. Patient not taking: Reported on 03/21/2014 10/13/13   Tammy L. Triplett, PA-C  predniSONE (DELTASONE) 20 MG tablet Take 3 tablets po qd x 2 days, then 2 tablets po qd x 2 days, then 1 tablet po qd x 2 days Patient not taking: Reported on 03/21/2014 10/13/13   Tammy L. Triplett, PA-C  ranitidine (ZANTAC) 75 MG tablet Take 75 mg by mouth daily.    Historical Provider, MD   BP 116/77 mmHg  Pulse 59  Temp(Src) 98.2 F (36.8 C) (Oral)  Resp 14  Ht 6\' 1"  (1.854 m)  Wt 231 lb 7.7 oz (105 kg)  BMI 30.55  kg/m2  SpO2 96% Physical Exam  Constitutional: He is oriented to person, place, and time. He appears well-developed and well-nourished. No distress.  HENT:  Head: Normocephalic and atraumatic.  Mouth/Throat: Oropharynx is clear and moist. No oropharyngeal exudate.  Eyes: EOM are normal. Pupils are equal, round, and reactive to light.  Neck: Normal range of motion. Neck supple.  No meningismus  Cardiovascular: Normal rate and regular rhythm.  Exam reveals no gallop and no friction rub.   No murmur heard. Pulmonary/Chest: Effort normal and breath sounds normal. No respiratory distress. He has no wheezes. He has no rales. He exhibits no tenderness.  Abdominal: Soft. Bowel sounds are normal. He  exhibits no distension and no mass. There is no tenderness. There is no rebound and no guarding.  Musculoskeletal: Normal range of motion. He exhibits no edema or tenderness.  Neurological: He is alert and oriented to person, place, and time.  No facial droop. Subjective decreased sensation to the lower left base compared to the right. 5/5 motor in right upper and right lower extremity. 4/5 motor in left upper and left lower extremity. Decreased sensation to light touch on the left extremities. Finger to nose intact.  Skin: Skin is warm and dry. No rash noted. No erythema.  Psychiatric: He has a normal mood and affect. His behavior is normal.  Nursing note and vitals reviewed.   ED Course  Procedures (including critical care time) Labs Review Labs Reviewed  COMPREHENSIVE METABOLIC PANEL - Abnormal; Notable for the following:    Sodium 136 (*)    Glucose, Bld 107 (*)    ALT 54 (*)    All other components within normal limits  I-STAT CHEM 8, ED - Abnormal; Notable for the following:    Glucose, Bld 108 (*)    All other components within normal limits  ETHANOL  APTT  CBC  DIFFERENTIAL  PROTIME-INR  URINE RAPID DRUG SCREEN (HOSP PERFORMED)  URINALYSIS, ROUTINE W REFLEX MICROSCOPIC  HEMOGLOBIN  A1C  I-STAT TROPOININ, ED  I-STAT TROPOININ, ED    Imaging Review Ct Head Wo Contrast  03/21/2014   CLINICAL DATA:  Left-sided facial droop, dizziness, nausea, vomiting  EXAM: CT HEAD WITHOUT CONTRAST  TECHNIQUE: Contiguous axial images were obtained from the base of the skull through the vertex without intravenous contrast.  COMPARISON:  05/28/2012  FINDINGS: There is no evidence of mass effect, midline shift or extra-axial fluid collections. There is no evidence of a space-occupying lesion or intracranial hemorrhage. There is no evidence of a cortical-based area of acute infarction.  The ventricles and sulci are appropriate for the patient's age. The basal cisterns are patent.  Visualized portions of the orbits are unremarkable. The visualized portions of the paranasal sinuses and mastoid air cells are unremarkable.  The osseous structures are unremarkable.  IMPRESSION: No acute intracranial pathology.  These results were called by telephone at the time of interpretation on 03/21/2014 at 4:03 am to Dr. Julianne Rice , who verbally acknowledged these results.   Electronically Signed   By: Kathreen Devoid   On: 03/21/2014 04:04     EKG Interpretation None      Date: 03/21/2014  Rate: 66   Rhythm: normal sinus rhythm  QRS Axis: normal  Intervals: normal  ST/T Wave abnormalities: normal  Conduction Disutrbances:none  Narrative Interpretation:   Old EKG Reviewed: none available   MDM   Final diagnoses:  Stroke    Code stroke initiated from triage. Dr. Aram Beecham evaluated the patient. Patient's weakness is now resolved. He now just has decreased sensation. CT head without any acute findings. Dr. Aram Beecham remitted recommends inpatient workup including MRI but states is not a candidate for TPA at this time.  Discussed with Dr. Posey Pronto. Will admit to telemetry bed.  Julianne Rice, MD 03/21/14 [phone]

## 2014-03-21 NOTE — Plan of Care (Signed)
Problem: Consults Goal: Skin Care Protocol Initiated - if Braden Score 18 or less If consults are not indicated, leave blank or document N/A  Outcome: Not Applicable Date Met:  03/21/14     

## 2014-03-21 NOTE — ED Notes (Addendum)
Pt was on lunch break at work when he noticed sudden facial numbness and tingling radiating down whole left side. Onset of symptoms at 0215. Dr. Lita Mains at bedside calling code stroke. Pt also reports nausea with the symptoms. Pt presents with left side facial droop, left arm drift, and unilateral weakness in left leg.

## 2014-03-21 NOTE — Progress Notes (Signed)
Bilateral carotid artery duplex completed:  1-39% ICA stenosis.  Vertebral artery flow is antegrade.     

## 2014-03-21 NOTE — ED Notes (Addendum)
Ativan 1 mg IVP ordered for pt prior to MRI due to pt is claustrophobic.

## 2014-03-21 NOTE — Consult Note (Addendum)
Referring Physician: ED    Chief Complaint: left sided numbness  HPI:                                                                                                                                         Andrew Love is an 48 y.o. male with a past medical history significant for HTN, CAD, OSA on CPAP, hypothyroidism, brought in via ambulance as a code stroke due to acute onset of the above stated symptoms. Patient said that he was on lunch break at work around [place] when suddenly he felt nauseated, dizzy, and the left face-arm-leg became numb and tingly, Stated that the left leg was " kind of heavy, although I was able to move everything". No associated HA, vertigo, double vision, difficulty swallowing, slurred speech, language or vision impairment. Initial NIHSS 1. CT brain showed no acute intracranial abnormality. Patient believes that he is feeling this way because of changes in his thyroid medications.  Date last known well:  Time last known well:  tPA Given: no, minimal sensory deficits NIHSS: 1   Past Medical History  Diagnosis Date  . CAD (coronary artery disease)   . Elevated liver enzymes   . Diverticulitis     Hx of; requiring 3 admissions  . Sigmoid colon ulcer     Rectal polyps  . Thyroid disease   . Psoriasis     Per medical history form dated 06/13/10.  . Chronic insomnia     Per medical history form dated 06/13/10.  . Colitis     Per medical history from dated 06/13/10.  Marland Kitchen Hypertension   . Hypothyroidism   . Bronchitis     history of  . Back pain     chronic  . Arthritis   . 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 Sleep apnea with use of continuous positive airway pressure (CPAP)     Past Surgical History  Procedure Laterality Date  . Colonoscopy  07/2008    Colitis,NSAID v. Ischemia,malrotation of the gut,Diverticulosis(L),hyperplastic  . Appendectomy    . Shoulder surgery      Left  . Rotator cuff repair      Left - per medical  history form dated 06/13/10.  . Thyroidectomy      Per medical history form dated 06/13/10.  . Colon surgery  2012  . Total hip arthroplasty  07/04/2011    Procedure: TOTAL HIP ARTHROPLASTY;  Surgeon: Johnny Bridge, MD;  Location: [facility];  Service: Orthopedics;  Laterality: Right;  . Joint replacement    . Cardiac catheterization  2009  . Cardiac catheterization      Per medical history from dated 06/13/10.  . Colonoscopy N/A 12/13/2012    Procedure: COLONOSCOPY;  Surgeon: Danie Binder, MD;  [facility];  Service: Endoscopy;  Laterality: N/A;  10:00AM-rescheduled to 11:15 Doris notified pt  Family History  Problem Relation Age of Onset  . Cancer Mother     breast cancer - per medical history form dated 06/13/10.  . Diverticulitis Mother   . Hypertension Mother   . Cancer Father     skin - per medical history form dated 06/13/10.  Marland Kitchen Hypertension Father   . Anesthesia problems Neg Hx   . Hypotension Neg Hx   . Malignant hyperthermia Neg Hx   . Pseudochol deficiency Neg Hx    Social History:  reports that he has quit smoking. He has never used smokeless tobacco. He reports that he drinks alcohol. He reports that he does not use illicit drugs.  Allergies:  Allergies  Allergen Reactions  . Other Hives, Shortness Of Breath and Itching    Insect bite- bug name unknown=    Medications:                                                                                                                           I have reviewed the patient's current medications.  ROS:                                                                                                                                       History obtained from the patient and chart review.  General ROS: negative for - chills, fatigue, fever, night sweat, or weight loss Psychological ROS: negative for - behavioral disorder, hallucinations, memory difficulties, mood swings or suicidal ideation Ophthalmic ROS: negative for  - blurry vision, double vision, eye pain or loss of vision ENT ROS: negative for - epistaxis, nasal discharge, oral lesions, sore throat, tinnitus or vertigo Allergy and Immunology ROS: negative for - hives or itchy/watery eyes Hematological and Lymphatic ROS: negative for - bleeding problems, bruising or swollen lymph nodes Endocrine ROS: negative for - galactorrhea, hair pattern changes, polydipsia/polyuria or temperature intolerance Respiratory ROS: negative for - cough, hemoptysis, shortness of breath or wheezing Cardiovascular ROS: negative for - chest pain, dyspnea on exertion, edema or irregular heartbeat Gastrointestinal ROS: negative for - abdominal pain, diarrhea, hematemesis, nausea/vomiting or stool incontinence Genito-Urinary ROS: negative for - dysuria, hematuria, incontinence or urinary frequency/urgency Musculoskeletal ROS: negative for - joint swelling or muscular weakness Neurological ROS: as noted in HPI Dermatological ROS: negative for rash and skin lesion changes  Physical exam: pleasant male in no apparent distress. Blood pressure  162/87, pulse 68, temperature 98.2 F (36.8 C), temperature source Oral, resp. rate 22, height 6\' 1"  (1.854 m), weight 105 kg (231 lb 7.7 oz), SpO2 100 %. Head: normocephalic. Neck: supple, no bruits, no JVD. Cardiac: no murmurs. Lungs: clear. Abdomen: soft, no tender, no mass. Extremities: no edema.  Neurologic Examination:                                                                                                      General: Mental Status: Alert, oriented, thought content appropriate.  Speech fluent without evidence of aphasia.  Able to follow 3 step commands without difficulty. Cranial Nerves: II: Discs flat bilaterally; Visual fields grossly normal, pupils equal, round, reactive to light and accommodation III,IV, VI: ptosis not present, extra-ocular motions intact bilaterally V,VII: smile symmetric, facial light touch sensation  normal bilaterally VIII: hearing normal bilaterally IX,X: gag reflex present XI: bilateral shoulder shrug XII: midline tongue extension without atrophy or fasciculations Motor: Right : Upper extremity   5/5    Left:     Upper extremity   5/5  Lower extremity   5/5     Lower extremity   5/5 Tone and bulk:normal tone throughout; no atrophy noted Sensory: Pinprick and light touch diminished in the left side. Deep Tendon Reflexes:  Right: Upper Extremity   Left: Upper extremity   biceps (C-5 to C-6) 2/4   biceps (C-5 to C-6) 2/4 tricep (C7) 2/4    triceps (C7) 2/4 Brachioradialis (C6) 2/4  Brachioradialis (C6) 2/4  Lower Extremity Lower Extremity  quadriceps (L-2 to L-4) 2/4   quadriceps (L-2 to L-4) 2/4 Achilles (S1) 2/4   Achilles (S1) 2/4  Plantars: Right: downgoing   Left: downgoing Cerebellar: normal finger-to-nose,  normal heel-to-shin test Gait:  No tested    Results for orders placed or performed during the hospital encounter of 03/21/14 (from the past 48 hour(s))  CBC     Status: None   Collection Time: 03/21/14  3:48 AM  Result Value Ref Range   WBC 9.8 4.0 - 10.5 K/uL   RBC 5.14 4.22 - 5.81 MIL/uL   Hemoglobin 17.0 13.0 - 17.0 g/dL   HCT 47.9 39.0 - 52.0 %   MCV 93.2 78.0 - 100.0 fL   MCH 33.1 26.0 - 34.0 pg   MCHC 35.5 30.0 - 36.0 g/dL   RDW 12.6 11.5 - 15.5 %   Platelets 276 150 - 400 K/uL  Differential     Status: None   Collection Time: 03/21/14  3:48 AM  Result Value Ref Range   Neutrophils Relative % 65 43 - 77 %   Neutro Abs 6.4 1.7 - 7.7 K/uL   Lymphocytes Relative 27 12 - 46 %   Lymphs Abs 2.6 0.7 - 4.0 K/uL   Monocytes Relative 5 3 - 12 %   Monocytes Absolute 0.5 0.1 - 1.0 K/uL   Eosinophils Relative 2 0 - 5 %   Eosinophils Absolute 0.2 0.0 - 0.7 K/uL   Basophils Relative 1 0 - 1 %   Basophils Absolute 0.1 0.0 -  0.1 K/uL  I-Stat Troponin, ED (not at [facility])     Status: None   Collection Time: 03/21/14  3:58 AM  Result Value Ref Range   Troponin  i, poc 0.02 0.00 - 0.08 ng/mL   Comment 3            Comment: Due to the release kinetics of cTnI, a negative result within the first hours of the onset of symptoms does not rule out myocardial infarction with certainty. If myocardial infarction is still suspected, repeat the test at appropriate intervals.   I-Stat Chem 8, ED     Status: Abnormal   Collection Time: 03/21/14  4:00 AM  Result Value Ref Range   Sodium 138 137 - 147 mEq/L   Potassium 3.9 3.7 - 5.3 mEq/L   Chloride 103 96 - 112 mEq/L   BUN 16 6 - 23 mg/dL   Creatinine, Ser 1.00 0.50 - 1.35 mg/dL   Glucose, Bld 108 (H) 70 - 99 mg/dL   Calcium, Ion 1.12 1.12 - 1.23 mmol/L   TCO2 26 0 - 100 mmol/L   Hemoglobin 17.0 13.0 - 17.0 g/dL   HCT 50.0 39.0 - 52.0 %   Ct Head Wo Contrast  03/21/2014   CLINICAL DATA:  Left-sided facial droop, dizziness, nausea, vomiting  EXAM: CT HEAD WITHOUT CONTRAST  TECHNIQUE: Contiguous axial images were obtained from the base of the skull through the vertex without intravenous contrast.  COMPARISON:  05/28/2012  FINDINGS: There is no evidence of mass effect, midline shift or extra-axial fluid collections. There is no evidence of a space-occupying lesion or intracranial hemorrhage. There is no evidence of a cortical-based area of acute infarction.  The ventricles and sulci are appropriate for the patient's age. The basal cisterns are patent.  Visualized portions of the orbits are unremarkable. The visualized portions of the paranasal sinuses and mastoid air cells are unremarkable.  The osseous structures are unremarkable.  IMPRESSION: No acute intracranial pathology.  These results were called by telephone at the time of interpretation on 03/21/2014 at 4:03 am to Dr. Julianne Rice , who verbally acknowledged these results.   Electronically Signed   By: Kathreen Devoid   On: 03/21/2014 04:04    Assessment: 48 y.o. male brought in as a code stroke due to acute onset left sided numbness and left leg  heaviness. NIHSS 1 (only sensory). CT brain without acute abnormality. Can not exclude a right subcortical infarct, perhaps right thalamus. Admit to medicine and complete stroke work up. Aspirin. Stroke team will follow up tomorrow.  Stroke Risk Factors - HTN, CAD  Plan: 1. HgbA1c, fasting lipid panel 2. MRI, MRA  of the brain without contrast 3. Echocardiogram 4. Carotid dopplers 5. Prophylactic therapy-aspirin after passing swallowing evaluation 6. Risk factor modification 7. Telemetry monitoring 8. Frequent neuro checks 9. PT/OT SLP ( no needed at this time)   Dorian Pod ,MD [facility] [phone]  03/21/2014, 4:14 AM

## 2014-03-21 NOTE — Progress Notes (Signed)
Discharge instructions given and explained to patient with spouse at bedside and they both verbalized understanding. RN informed patient about script for pick-up at the pharmacy for thyroid. Patient and spouse also stated the importance of follow-up appointment with PCP in a week. Stroke prevention and early stages of recovery booklets given to patient at discharged. Tomma Rakers RN

## 2014-03-21 NOTE — Discharge Summary (Signed)
Physician Discharge Summary  Andrew Love EFE:[id number] DOB: 1965/08/07 DOA: 03/21/2014  PCP: [name], MD  Admit date: 03/21/2014 Discharge date: 03/21/2014  Time spent: 45 minutes  Recommendations for Outpatient Follow-up:   PCP in 1 week, needs repeat TSH in 4 weeks  Consider low dose statin and TG lowering medications   Discharge Diagnoses:    Possible TIA   Sensory disturbance   Hypoxemia   Atypical chest pain   Essential hypertension   Discharge Condition: stable  Diet recommendation: low sodium  Filed Weights   03/21/14 0348  Weight: 105 kg (231 lb 7.7 oz)    History of present illness:  CC: left upper and left lower extremity numbness with left facial numbness HPI: 48 year old male with a history of hypertension, coronary artery disease, hypothyroidism presented to the emergency department after acute onset of left facial numbness, left upper extremity numbness and left lower extremity numbness that began around 2:15 AM on the morning on 03/21/2014. A code stroke was called, and neurology saw the patient and recommended admission for a stroke workup. The patient stated that the disturbance lasted approximately one hour. His numbness is essentially resolved at this time.The patient denied any headaches, visual disturbance, focal extremity weakness, syncope. He did have some dizziness and an episode of nausea and vomiting. CT of the brain was unremarkable in the emergency department. The patient also complained of chest discomfort that developed in the emergency department which has now resolved. He also said he had some shortness of breath earlier in the admission which has since resolved. Notably, the patient stated that he had been taking Synthroid 300 g daily for many years until he ran out earlier this week. He was without his Synthroid for 4 days. When his refill came to the pharmacy, it was for 25 g daily, which he started few days ago.  Hospital  Course:  TIA vs. Hypothyroidism-related - subjective patchy numbness not consistent with stroke pattern, may be related to hypothyroidism. however has some stroke risk factors  Resultant Resolving deficits, but patchy subjective numbness left arm and the left toe not consistent with TIA  MRI unremarkable  MRA unremarkable  Carotid Doppler Bilateral carotid artery duplex completed: 1-39% ICA stenosis. Vertebral artery flow is antegrade.  2D Echo - no cardiac source of emboli identified  LDL - 79, but still high triglycerides at 390  HgbA1c 5.3  no antithrombotics prior to admission, now on aspirin 325 mg orally every day  Ongoing aggressive risk factor management  Hypothyroidism  Thyroid gland removal due to goiter  Normally on Synthroid 300 g but recently changed to 25 g by PCP  TSH was high at 16.8  Hence Synthroid dose increased back to 227mcg, will need repeat TSH in 4-6weeks  Hypertension  Home meds: Avapro 20 mg daily  Stable  Hyperlipidemia  Home meds: No lipid lowering medications prior to admission  LDL 79, goal < 70  Recommended low-dose statin, patient wants to d/w PCP   Diabetes  HgbA1c 5.3  Glucose at the normal range  Controlled    Obstructive sleep apnea, on CPAP at home   Consultations: Neuro  Discharge Exam: Filed Vitals:   03/21/14 1009  BP: 130/78  Pulse: 69  Temp: 97.7 F (36.5 C)  Resp: 16    General: AAOx3 Cardiovascular: S1S2/RRR Respiratory: CTAB  Discharge Instructions You were cared for by a hospitalist during your hospital stay. If you have any questions about your discharge medications or the care you received  while you were in the hospital after you are discharged, you can call the unit and asked to speak with the hospitalist on call if the hospitalist that took care of you is not available. Once you are discharged, your primary care physician will handle any further medical issues. Please note that NO  REFILLS for any discharge medications will be authorized once you are discharged, as it is imperative that you return to your primary care physician (or establish a relationship with a primary care physician if you do not have one) for your aftercare needs so that they can reassess your need for medications and monitor your lab values.  Discharge Instructions    Diet - low sodium heart healthy    Complete by:  As directed      Increase activity slowly    Complete by:  As directed           Current Discharge Medication List    START taking these medications   Details  aspirin 325 MG tablet Take 1 tablet (325 mg total) by mouth daily.      CONTINUE these medications which have CHANGED   Details  levothyroxine (SYNTHROID, LEVOTHROID) 125 MCG tablet Take 2 tablets (250 mcg total) by mouth daily at 3 pm. Qty: 60 tablet, Refills: 0      CONTINUE these medications which have NOT CHANGED   Details  diazepam (VALIUM) 5 MG tablet Take 1 tablet (5 mg total) by mouth every 12 (twelve) hours as needed for muscle spasms. Qty: 10 tablet, Refills: 0    EPINEPHrine (EPIPEN) 0.3 mg/0.3 mL SOAJ Inject 0.3 mLs (0.3 mg total) into the muscle as needed. Qty: 1 Device, Refills: 3    methocarbamol (ROBAXIN) 500 MG tablet Take 2 tablets (1,000 mg total) by mouth 3 (three) times daily. Prn muscle spasms Qty: 42 tablet, Refills: 0    olmesartan (BENICAR) 20 MG tablet Take 20 mg by mouth every evening.     !! oxyCODONE-acetaminophen (PERCOCET/ROXICET) 5-325 MG per tablet Take 1-2 tablets by mouth every 6 (six) hours as needed for severe pain. Qty: 20 tablet, Refills: 0    zolpidem (AMBIEN) 10 MG tablet Take 10 mg by mouth every morning.     nitroGLYCERIN (NITROSTAT) 0.4 MG SL tablet Place 0.4 mg under the tongue every 5 (five) minutes x 3 doses as needed for chest pain.    !! oxyCODONE-acetaminophen (PERCOCET/ROXICET) 5-325 MG per tablet Take 1 tablet by mouth every 4 (four) hours as needed for severe  pain. Qty: 15 tablet, Refills: 0    ranitidine (ZANTAC) 75 MG tablet Take 75 mg by mouth daily.     !! - Potential duplicate medications found. Please discuss with provider.    STOP taking these medications     ibuprofen (ADVIL,MOTRIN) 200 MG tablet      methylPREDNIsolone (MEDROL DOSPACK) 4 MG tablet      predniSONE (DELTASONE) 20 MG tablet        Allergies  Allergen Reactions  . Other Hives, Shortness Of Breath and Itching    Insect bite- bug name unknown=   Follow-up Information    Follow up with [name], MD. Schedule an appointment as soon as possible for a visit in 1 week.   Specialty:  Internal Medicine   Contact information:   [address] [place] [address] [phone]        The results of significant diagnostics from this hospitalization (including imaging, microbiology, ancillary and laboratory) are listed below for  reference.    Significant Diagnostic Studies: Ct Head Wo Contrast  03/21/2014   CLINICAL DATA:  Left-sided facial droop, dizziness, nausea, vomiting  EXAM: CT HEAD WITHOUT CONTRAST  TECHNIQUE: Contiguous axial images were obtained from the base of the skull through the vertex without intravenous contrast.  COMPARISON:  05/28/2012  FINDINGS: There is no evidence of mass effect, midline shift or extra-axial fluid collections. There is no evidence of a space-occupying lesion or intracranial hemorrhage. There is no evidence of a cortical-based area of acute infarction.  The ventricles and sulci are appropriate for the patient's age. The basal cisterns are patent.  Visualized portions of the orbits are unremarkable. The visualized portions of the paranasal sinuses and mastoid air cells are unremarkable.  The osseous structures are unremarkable.  IMPRESSION: No acute intracranial pathology.  These results were called by telephone at the time of interpretation on 03/21/2014 at 4:03 am to Dr. Julianne Rice , who verbally acknowledged these  results.   Electronically Signed   By: Kathreen Devoid   On: 03/21/2014 04:04   Mr Jodene Nam Head Wo Contrast  03/21/2014   CLINICAL DATA:  Acute onset of dizziness and vertigo with left facial numbness. Also left lower extremity numbness.  EXAM: MRI HEAD WITHOUT CONTRAST  MRA HEAD WITHOUT CONTRAST  TECHNIQUE: Multiplanar, multiecho pulse sequences of the brain and surrounding structures were obtained without intravenous contrast. Angiographic images of the head were obtained using MRA technique without contrast.  COMPARISON:  Head CT same day  FINDINGS: MRI HEAD FINDINGS  The brain has a normal appearance on all pulse sequences without evidence of malformation, atrophy, old or acute infarction, mass lesion, hemorrhage, hydrocephalus or extra-axial collection. No pituitary mass. No fluid in the sinuses, middle ears or mastoids. No skull or skullbase lesion. There is flow in the major vessels at the base of the brain. Major venous sinuses show flow.  MRA HEAD FINDINGS  Both internal carotid arteries are widely patent into the brain. No siphon stenosis. The anterior and middle cerebral vessels are normal without proximal stenosis, aneurysm or vascular malformation.  Both vertebral arteries are widely patent to the basilar. No basilar stenosis. Posterior circulation branch vessels are normal.  IMPRESSION: Normal MRI of the brain and normal intracranial MR angiography. No cause of the presenting symptoms is identified.   Electronically Signed   By: Nelson Chimes M.D.   On: 03/21/2014 12:47   Mr Brain Wo Contrast  03/21/2014   CLINICAL DATA:  Acute onset of dizziness and vertigo with left facial numbness. Also left lower extremity numbness.  EXAM: MRI HEAD WITHOUT CONTRAST  MRA HEAD WITHOUT CONTRAST  TECHNIQUE: Multiplanar, multiecho pulse sequences of the brain and surrounding structures were obtained without intravenous contrast. Angiographic images of the head were obtained using MRA technique without contrast.   COMPARISON:  Head CT same day  FINDINGS: MRI HEAD FINDINGS  The brain has a normal appearance on all pulse sequences without evidence of malformation, atrophy, old or acute infarction, mass lesion, hemorrhage, hydrocephalus or extra-axial collection. No pituitary mass. No fluid in the sinuses, middle ears or mastoids. No skull or skullbase lesion. There is flow in the major vessels at the base of the brain. Major venous sinuses show flow.  MRA HEAD FINDINGS  Both internal carotid arteries are widely patent into the brain. No siphon stenosis. The anterior and middle cerebral vessels are normal without proximal stenosis, aneurysm or vascular malformation.  Both vertebral arteries are widely patent to the basilar. No  basilar stenosis. Posterior circulation branch vessels are normal.  IMPRESSION: Normal MRI of the brain and normal intracranial MR angiography. No cause of the presenting symptoms is identified.   Electronically Signed   By: Nelson Chimes M.D.   On: 03/21/2014 12:47    Microbiology: No results found for this or any previous visit (from the past 240 hour(s)).   Labs: Basic Metabolic Panel:  Liver Function Tests:  Recent Labs Lab 03/21/14 0348  AST 32  ALT 54*  ALKPHOS 97  BILITOT 0.4  PROT 7.8  ALBUMIN 4.5   No results for input(s): LIPASE, AMYLASE in the last 168 hours. No results for input(s): AMMONIA in the last 168 hours. CBC:  Recent Labs Lab 03/21/14 0348 03/21/14 0400  WBC 9.8  --   NEUTROABS 6.4  --   HGB 17.0 17.0  HCT 47.9 50.0  MCV 93.2  --   PLT 276  --    Cardiac Enzymes:  Recent Labs Lab 03/21/14 0858  TROPONINI <0.30   BNP: BNP (last 3 results) No results for input(s): PROBNP in the last 8760 hours. CBG: No results for input(s): GLUCAP in the last 168 hours.     SignedDomenic Polite  [facility] 03/21/2014, 1:20 PM

## 2014-03-21 NOTE — Progress Notes (Addendum)
SLP Cancellation Note  Patient Details Name: Andrew Love MRN: [id number] DOB: 10-Sep-1965   Cancelled treatment:       Reason Eval/Treat Not Completed: Other (comment) (Family declined); nursing had no concerns with speech/language; order dated 03/22/14   Linah Klapper,PAT, M.S., CCC-SLP 03/21/2014, 2:03 PM

## 2014-03-21 NOTE — H&P (Signed)
History and Physical  Andrew Love CWU:[id number] DOB: 1965-05-12 DOA: 03/21/2014   PCP: [name], MD  CC: left upper and left lower extremity numbness with left facial numbness HPI:  48 year old male with a history of hypertension, coronary artery disease, hypothyroidism presented to the emergency department after acute onset of left facial numbness, left upper extremity numbness and left lower extremity numbness that began around 2:15 AM on the morning on 03/21/2014. A code stroke was called, and neurology saw the patient and recommended admission for a stroke workup. The patient stated that the disturbance lasted approximately one hour. His numbness is essentially resolved at this time.The patient denied any headaches, visual disturbance, focal extremity weakness, syncope. He did have some dizziness and an episode of nausea and vomiting. CT of the brain was unremarkable in the emergency department. The patient also complained of chest discomfort that developed in the emergency department which has now resolved. He also said he had some shortness of breath earlier in the admission which has since resolved. Notably, the patient states that he has been taking Synthroid 300 g daily for many years until he ran out earlier this week. He was without his Synthroid for 4 days. When his refill came to the pharmacy, it was for 25 g daily. The patient is unclear whether he has had any recent thyroid function studies performed. In addition, the patient finished a 10 day taper of prednisone 3 weeks ago. The patient denies any illegal drugs. He drinks alcohol socially. He quit smoking 2 years ago after 40 pack years.  In the emergency department, EKG was sinus rhythm without any ST changes. BMP and CBC were essentially unremarkable. Hepatic enzymes showed slight elevation of ALT 54, but was otherwise unremarkable. Urine drug screen and urinalysis were unremarkable.   Assessment/Plan: Sensory  disturbance -Concerned about stroke -Patient had recent dramatic decrease in his levothyroxine dose which may contribute to his sensory disturbance--> Check TSH and free T4 -CT brain negative -MRI brain/MRA brain -Carotid duplex -Echocardiogram -Hemoglobin A1c/ last lipid panel -aspirin 325 mg daily Atypical chest discomfort -EKG was unremarkable -Second troponin -09/24/2012 Myoview negative, EF 55% Hypoxemia -The patient was noted to have oxygen saturation of 88% on room air in the emergency department -Chest x-ray -History is stable on room air without any distress Hypertension -Continue ARB        Past Medical History  Diagnosis Date  . CAD (coronary artery disease)   . Elevated liver enzymes   . Diverticulitis     Hx of; requiring 3 admissions  . Sigmoid colon ulcer     Rectal polyps  . Thyroid disease   . Psoriasis     Per medical history form dated 06/13/10.  . Chronic insomnia     Per medical history form dated 06/13/10.  . Colitis     Per medical history from dated 06/13/10.  Marland Kitchen Hypertension   . Hypothyroidism   . Bronchitis     history of  . Back pain     chronic  . Arthritis   . 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 Sleep apnea with use of continuous positive airway pressure (CPAP)    Past Surgical History  Procedure Laterality Date  . Colonoscopy  07/2008    Colitis,NSAID v. Ischemia,malrotation of the gut,Diverticulosis(L),hyperplastic  . Appendectomy    . Shoulder surgery      Left  . Rotator cuff repair      Left - per  medical history form dated 06/13/10.  . Thyroidectomy      Per medical history form dated 06/13/10.  . Colon surgery  2012  . Total hip arthroplasty  07/04/2011    Procedure: TOTAL HIP ARTHROPLASTY;  Surgeon: Johnny Bridge, MD;  Location: [facility];  Service: Orthopedics;  Laterality: Right;  . Joint replacement    . Cardiac catheterization  2009  . Cardiac catheterization      Per medical history from dated  06/13/10.  . Colonoscopy N/A 12/13/2012    Procedure: COLONOSCOPY;  Surgeon: Danie Binder, MD;  Location[facility];  Service: Endoscopy;  Laterality: N/A;  10:00AM-rescheduled to 11:15 Doris notified pt   Social History:  reports that he has quit smoking. He has never used smokeless tobacco. He reports that he drinks alcohol. He reports that he does not use illicit drugs.   Family History  Problem Relation Age of Onset  . Cancer Mother     breast cancer - per medical history form dated 06/13/10.  . Diverticulitis Mother   . Hypertension Mother   . Cancer Father     skin - per medical history form dated 06/13/10.  Marland Kitchen Hypertension Father   . Anesthesia problems Neg Hx   . Hypotension Neg Hx   . Malignant hyperthermia Neg Hx   . Pseudochol deficiency Neg Hx      Allergies  Allergen Reactions  . Other Hives, Shortness Of Breath and Itching    Insect bite- bug name unknown=      Prior to Admission medications   Medication Sig Start Date End Date Taking? Authorizing Provider  diazepam (VALIUM) 5 MG tablet Take 1 tablet (5 mg total) by mouth every 12 (twelve) hours as needed for muscle spasms. 07/30/13  Yes Merryl Hacker, MD  EPINEPHrine (EPIPEN) 0.3 mg/0.3 mL SOAJ Inject 0.3 mLs (0.3 mg total) into the muscle as needed. Patient taking differently: Inject 0.3 mg into the muscle daily as needed (allergic reaction).  12/01/12  Yes Teressa Lower, MD  ibuprofen (ADVIL,MOTRIN) 200 MG tablet Take 600 mg by mouth every 6 (six) hours as needed for pain.   Yes Historical Provider, MD  levothyroxine (SYNTHROID, LEVOTHROID) 25 MCG tablet Take 25 mcg by mouth daily before breakfast.   Yes Historical Provider, MD  methocarbamol (ROBAXIN) 500 MG tablet Take 2 tablets (1,000 mg total) by mouth 3 (three) times daily. Prn muscle spasms 10/13/13  Yes Tammy L. Triplett, PA-C  olmesartan (BENICAR) 20 MG tablet Take 20 mg by mouth every evening.    Yes Historical Provider, MD  oxyCODONE-acetaminophen  (PERCOCET/ROXICET) 5-325 MG per tablet Take 1-2 tablets by mouth every 6 (six) hours as needed for severe pain. 07/30/13  Yes Merryl Hacker, MD  zolpidem (AMBIEN) 10 MG tablet Take 10 mg by mouth every morning.    Yes Historical Provider, MD  levothyroxine (SYNTHROID, LEVOTHROID) 300 MCG tablet Take 300 mcg by mouth daily at 3 pm.    Historical Provider, MD  methylPREDNIsolone (MEDROL DOSPACK) 4 MG tablet follow package directions Patient not taking: Reported on 03/21/2014 07/30/13   Merryl Hacker, MD  nitroGLYCERIN (NITROSTAT) 0.4 MG SL tablet Place 0.4 mg under the tongue every 5 (five) minutes x 3 doses as needed for chest pain. 11/29/11 11/28/13  Kathie Dike, MD  oxyCODONE-acetaminophen (PERCOCET/ROXICET) 5-325 MG per tablet Take 1 tablet by mouth every 4 (four) hours as needed for severe pain. Patient not taking: Reported on 03/21/2014 10/13/13   Tammy L. Triplett,  PA-C  predniSONE (DELTASONE) 20 MG tablet Take 3 tablets po qd x 2 days, then 2 tablets po qd x 2 days, then 1 tablet po qd x 2 days Patient not taking: Reported on 03/21/2014 10/13/13   Tammy L. Triplett, PA-C  ranitidine (ZANTAC) 75 MG tablet Take 75 mg by mouth daily.    Historical Provider, MD    Review of Systems:  Constitutional:  No weight loss, night sweats, Fevers, chills Head&Eyes: No headache.  No vision loss.  No eye pain or scotoma ENT:  No Difficulty swallowing,Tooth/dental problems,Sore throat,   Cardio-vascular:  No  Orthopnea, PND, swelling in lower extremities,  dizziness, palpitations  GI:  No  abdominal pain, nausea, vomiting, diarrhea, loss of appetite, hematochezia, melena, heartburn, indigestion, Resp:  No shortness of breath with exertion or at rest. No cough. No coughing up of blood .No wheezing.No chest wall deformity  Skin:  no rash or lesions.  GU:  no dysuria, change in color of urine, no urgency or frequency. No flank pain.  Musculoskeletal:  No joint pain or swelling. No decreased  range of motion. Psych:  No change in mood or affect. . Neurologic: No headache,no focal weakness, no vision loss. No syncope  Physical Exam: Filed Vitals:   03/21/14 0445 03/21/14 0500 03/21/14 0529 03/21/14 0545  BP: 132/70 117/66  121/74  Pulse: 59 63  56  Temp:   97.8 F (36.6 C)   TempSrc:      Resp: 16 16  12   Height:      Weight:      SpO2: 93% 89%  88%   General:  A&O x 3, NAD, nontoxic, pleasant/cooperative Head/Eye: No conjunctival hemorrhage, no icterus, [place]/AT, No nystagmus ENT:  No icterus,  No thrush, good dentition, no pharyngeal exudate Neck:  No masses, no lymphadenpathy, no bruits CV:  RRR, no rub, no gallop, no S3 Lung:  CTAB, good air movement, no wheeze, no rhonchi Abdomen: soft/NT, +BS, nondistended, no peritoneal signs Ext: No cyanosis, No rashes, No petechiae, No lymphangitis, No edema Neuro: CNII-XII intact, strength 4/5 in bilateral upper and lower extremities, no dysmetria  Labs on Admission:  Basic Metabolic Panel:  Recent Labs Lab 03/21/14 0348 03/21/14 0400  NA 136* 138  K 4.1 3.9  CL 97 103  CO2 24  --   GLUCOSE 107* 108*  BUN 14 16  CREATININE 0.95 1.00  CALCIUM 9.6  --    Liver Function Tests:  Recent Labs Lab 03/21/14 0348  AST 32  ALT 54*  ALKPHOS 97  BILITOT 0.4  PROT 7.8  ALBUMIN 4.5   No results for input(s): LIPASE, AMYLASE in the last 168 hours. No results for input(s): AMMONIA in the last 168 hours. CBC:  Recent Labs Lab 03/21/14 0348 03/21/14 0400  WBC 9.8  --   NEUTROABS 6.4  --   HGB 17.0 17.0  HCT 47.9 50.0  MCV 93.2  --   PLT 276  --    Cardiac Enzymes: No results for input(s): CKTOTAL, CKMB, CKMBINDEX, TROPONINI in the last 168 hours. BNP: Invalid input(s): POCBNP CBG: No results for input(s): GLUCAP in the last 168 hours.  Radiological Exams on Admission: Ct Head Wo Contrast  03/21/2014   CLINICAL DATA:  Left-sided facial droop, dizziness, nausea, vomiting  EXAM: CT HEAD WITHOUT CONTRAST   TECHNIQUE: Contiguous axial images were obtained from the base of the skull through the vertex without intravenous contrast.  COMPARISON:  05/28/2012  FINDINGS: There is no evidence  of mass effect, midline shift or extra-axial fluid collections. There is no evidence of a space-occupying lesion or intracranial hemorrhage. There is no evidence of a cortical-based area of acute infarction.  The ventricles and sulci are appropriate for the patient's age. The basal cisterns are patent.  Visualized portions of the orbits are unremarkable. The visualized portions of the paranasal sinuses and mastoid air cells are unremarkable.  The osseous structures are unremarkable.  IMPRESSION: No acute intracranial pathology.  These results were called by telephone at the time of interpretation on 03/21/2014 at 4:03 am to Dr. Julianne Rice , who verbally acknowledged these results.   Electronically Signed   By: Kathreen Devoid   On: 03/21/2014 04:04    EKG: Independently reviewed. Sinus rhythm, no ST-T wave changes    Time spent:60 minutes Code Status:   FULL Family Communication:   Wife updated at bedside   Shady Padron, DO  [facility] Pager [phone]  If 7PM-7AM, please contact night-coverage [email or web address] Password [email or web address] 03/21/2014, 8:35 AM

## 2014-03-30 ENCOUNTER — Emergency Department (HOSPITAL_COMMUNITY): Payer: 59

## 2014-03-30 ENCOUNTER — Encounter (HOSPITAL_COMMUNITY): Payer: Self-pay

## 2014-03-30 ENCOUNTER — Emergency Department (HOSPITAL_COMMUNITY)
Admission: EM | Admit: 2014-03-30 | Discharge: 2014-03-31 | Disposition: A | Discharge status: Home / Self Care | Payer: 59 | Attending: Emergency Medicine | Admitting: Emergency Medicine

## 2014-03-30 DIAGNOSIS — G8929 Other chronic pain: Secondary | ICD-10-CM | POA: Insufficient documentation

## 2014-03-30 DIAGNOSIS — I10 Essential (primary) hypertension: Secondary | ICD-10-CM | POA: Insufficient documentation

## 2014-03-30 DIAGNOSIS — Z87891 Personal history of nicotine dependence: Secondary | ICD-10-CM | POA: Insufficient documentation

## 2014-03-30 DIAGNOSIS — E039 Hypothyroidism, unspecified: Secondary | ICD-10-CM | POA: Insufficient documentation

## 2014-03-30 DIAGNOSIS — K5792 Diverticulitis of intestine, part unspecified, without perforation or abscess without bleeding: Secondary | ICD-10-CM | POA: Diagnosis not present

## 2014-03-30 DIAGNOSIS — I2510 Atherosclerotic heart disease of native coronary artery without angina pectoris: Secondary | ICD-10-CM | POA: Insufficient documentation

## 2014-03-30 DIAGNOSIS — H539 Unspecified visual disturbance: Secondary | ICD-10-CM | POA: Insufficient documentation

## 2014-03-30 DIAGNOSIS — R109 Unspecified abdominal pain: Secondary | ICD-10-CM

## 2014-03-30 DIAGNOSIS — Z79899 Other long term (current) drug therapy: Secondary | ICD-10-CM | POA: Insufficient documentation

## 2014-03-30 DIAGNOSIS — R42 Dizziness and giddiness: Secondary | ICD-10-CM | POA: Insufficient documentation

## 2014-03-30 DIAGNOSIS — Z7982 Long term (current) use of aspirin: Secondary | ICD-10-CM | POA: Diagnosis not present

## 2014-03-30 DIAGNOSIS — R51 Headache: Secondary | ICD-10-CM | POA: Insufficient documentation

## 2014-03-30 DIAGNOSIS — M1990 Unspecified osteoarthritis, unspecified site: Secondary | ICD-10-CM | POA: Insufficient documentation

## 2014-03-30 DIAGNOSIS — G4733 Obstructive sleep apnea (adult) (pediatric): Secondary | ICD-10-CM | POA: Insufficient documentation

## 2014-03-30 DIAGNOSIS — Z9889 Other specified postprocedural states: Secondary | ICD-10-CM | POA: Insufficient documentation

## 2014-03-30 DIAGNOSIS — M1630 Unilateral osteoarthritis resulting from hip dysplasia, unspecified hip: Secondary | ICD-10-CM | POA: Insufficient documentation

## 2014-03-30 DIAGNOSIS — Z872 Personal history of diseases of the skin and subcutaneous tissue: Secondary | ICD-10-CM | POA: Diagnosis not present

## 2014-03-30 DIAGNOSIS — Z9089 Acquired absence of other organs: Secondary | ICD-10-CM | POA: Diagnosis not present

## 2014-03-30 DIAGNOSIS — Z8709 Personal history of other diseases of the respiratory system: Secondary | ICD-10-CM | POA: Diagnosis not present

## 2014-03-30 DIAGNOSIS — R1032 Left lower quadrant pain: Secondary | ICD-10-CM | POA: Diagnosis present

## 2014-03-30 LAB — CBC WITH DIFFERENTIAL/PLATELET
Basophils Absolute: 0.1 10*3/uL (ref 0.0–0.1)
Basophils Relative: 1 % (ref 0–1)
Eosinophils Absolute: 0.2 10*3/uL (ref 0.0–0.7)
Eosinophils Relative: 3 % (ref 0–5)
HCT: 46 % (ref 39.0–52.0)
Hemoglobin: 16.3 g/dL (ref 13.0–17.0)
Lymphocytes Relative: 30 % (ref 12–46)
Lymphs Abs: 2.6 10*3/uL (ref 0.7–4.0)
MCH: 32.5 pg (ref 26.0–34.0)
MCHC: 35.4 g/dL (ref 30.0–36.0)
MCV: 91.8 fL (ref 78.0–100.0)
Monocytes Absolute: 0.7 10*3/uL (ref 0.1–1.0)
Monocytes Relative: 8 % (ref 3–12)
Neutro Abs: 5.2 10*3/uL (ref 1.7–7.7)
Neutrophils Relative %: 58 % (ref 43–77)
Platelets: 229 10*3/uL (ref 150–400)
RBC: 5.01 MIL/uL (ref 4.22–5.81)
RDW: 12.9 % (ref 11.5–15.5)
WBC: 8.8 10*3/uL (ref 4.0–10.5)

## 2014-03-30 LAB — LIPASE, BLOOD: Lipase: 25 U/L (ref 11–59)

## 2014-03-30 LAB — BASIC METABOLIC PANEL
Anion gap: 14 (ref 5–15)
BUN: 11 mg/dL (ref 6–23)
CO2: 25 mEq/L (ref 19–32)
Calcium: 9.8 mg/dL (ref 8.4–10.5)
Chloride: 98 mEq/L (ref 96–112)
Creatinine, Ser: 1 mg/dL (ref 0.50–1.35)
GFR calc Af Amer: >90 mL/min (ref >90)
GFR calc non Af Amer: 88 mL/min — ABNORMAL LOW (ref >90)
Glucose, Bld: 110 mg/dL — ABNORMAL HIGH (ref 70–99)
Potassium: 3.7 mEq/L (ref 3.7–5.3)
Sodium: 137 mEq/L (ref 137–147)

## 2014-03-30 LAB — HEPATIC FUNCTION PANEL
ALT: 50 U/L (ref 0–53)
AST: 25 U/L (ref 0–37)
Albumin: 4.6 g/dL (ref 3.5–5.2)
Alkaline Phosphatase: 88 U/L (ref 39–117)
Bilirubin, Direct: <0.2 mg/dL (ref 0.0–0.3)
Total Bilirubin: 0.9 mg/dL (ref 0.3–1.2)
Total Protein: 7.9 g/dL (ref 6.0–8.3)

## 2014-03-30 MED ORDER — HYDROMORPHONE HCL 1 MG/ML IJ SOLN
1.0000 mg | Freq: Once | INTRAMUSCULAR | Status: AC
  Administered 2014-03-30: 1 mg via INTRAVENOUS
  Filled 2014-03-30: qty 1

## 2014-03-30 MED ORDER — SODIUM CHLORIDE 0.9 % IV SOLN
1000.0000 mL | INTRAVENOUS | Status: DC
  Administered 2014-03-31: 1000 mL via INTRAVENOUS

## 2014-03-30 MED ORDER — IOHEXOL 300 MG/ML  SOLN
50.0000 mL | Freq: Once | INTRAMUSCULAR | Status: AC | PRN
  Administered 2014-03-30: 50 mL via ORAL

## 2014-03-30 MED ORDER — ONDANSETRON HCL 4 MG/2ML IJ SOLN
4.0000 mg | Freq: Once | INTRAMUSCULAR | Status: AC
  Administered 2014-03-30: 4 mg via INTRAVENOUS
  Filled 2014-03-30: qty 2

## 2014-03-30 MED ORDER — HYDROMORPHONE HCL 1 MG/ML IJ SOLN
1.0000 mg | Freq: Once | INTRAMUSCULAR | Status: AC
  Administered 2014-03-31: 1 mg via INTRAVENOUS
  Filled 2014-03-30: qty 1

## 2014-03-30 MED ORDER — ONDANSETRON HCL 4 MG/2ML IJ SOLN
4.0000 mg | Freq: Once | INTRAMUSCULAR | Status: AC
  Administered 2014-03-31: 4 mg via INTRAVENOUS
  Filled 2014-03-30: qty 2

## 2014-03-30 MED ORDER — SODIUM CHLORIDE 0.9 % IV SOLN
1000.0000 mL | Freq: Once | INTRAVENOUS | Status: AC
  Administered 2014-03-30: 1000 mL via INTRAVENOUS

## 2014-03-30 NOTE — ED Notes (Signed)
abd pain x1 month. Seen PCP for same and referred to ER. Pt reports last 3 days he has had vomiting.

## 2014-03-30 NOTE — ED Provider Notes (Addendum)
CSN: [id number]     Arrival date & time 03/30/14  1659 History  This chart was scribe for Fredia Sorrow, MD by Judithann Sauger, ED Scribe. The patient was seen in room APA15/[facility]15 and the patient's care was started at 10:48 PM.    Chief Complaint  Patient presents with  . Abdominal Pain   The history is provided by the patient. No language interpreter was used.   HPI Comments: Andrew Love is a 48 y.o. male who presents to the Emergency Department complaining of a 6/10 sharp constant LLQ pain onset 1 month ago with N/V/D onset 4 days ago. He reports associated SOB, dizziness, headache, and back pain. He reports bright red blood in his vomit during the first episode which has since resolved. He reports not vomiting since 3 am this morning. He reports one episode of diarrhea this morning. He adds that prior to that diarrhea episode, he was constipated. He denies melena.   PCP: Dr. Gerarda Fraction    Past Medical History  Diagnosis Date  . CAD (coronary artery disease)   . Elevated liver enzymes   . Diverticulitis     Hx of; requiring 3 admissions  . Sigmoid colon ulcer     Rectal polyps  . Thyroid disease   . Psoriasis     Per medical history form dated 06/13/10.  . Chronic insomnia     Per medical history form dated 06/13/10.  . Colitis     Per medical history from dated 06/13/10.  Marland Kitchen Hypertension   . Hypothyroidism   . Bronchitis     history of  . Back pain     chronic  . Arthritis   . 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 Sleep apnea with use of continuous positive airway pressure (CPAP)    Past Surgical History  Procedure Laterality Date  . Colonoscopy  07/2008    Colitis,NSAID v. Ischemia,malrotation of the gut,Diverticulosis(L),hyperplastic  . Appendectomy    . Shoulder surgery      Left  . Rotator cuff repair      Left - per medical history form dated 06/13/10.  . Thyroidectomy      Per medical history form dated 06/13/10.  . Colon surgery  2012  .  Total hip arthroplasty  07/04/2011    Procedure: TOTAL HIP ARTHROPLASTY;  Surgeon: Johnny Bridge, MD;  Location: [facility];  Service: Orthopedics;  Laterality: Right;  . Joint replacement    . Cardiac catheterization  2009  . Cardiac catheterization      Per medical history from dated 06/13/10.  . Colonoscopy N/A 12/13/2012    Procedure: COLONOSCOPY;  Surgeon: Danie Binder, MD;  Location: [facility];  Service: Endoscopy;  Laterality: N/A;  10:00AM-rescheduled to 11:15 Doris notified pt   Family History  Problem Relation Age of Onset  . Cancer Mother     breast cancer - per medical history form dated 06/13/10.  . Diverticulitis Mother   . Hypertension Mother   . Cancer Father     skin - per medical history form dated 06/13/10.  Marland Kitchen Hypertension Father   . Anesthesia problems Neg Hx   . Hypotension Neg Hx   . Malignant hyperthermia Neg Hx   . Pseudochol deficiency Neg Hx    History  Substance Use Topics  . Smoking status: Former Smoker -- 0.25 packs/day for 18 years  . Smokeless tobacco: Never Used     Comment: Has been wearing patch for 20 days  .  Alcohol Use: Yes     Comment: Occasional    Review of Systems  Constitutional: Positive for fever and chills.  HENT: Positive for congestion. Negative for sore throat.   Eyes: Positive for visual disturbance (Blurred vision).  Respiratory: Positive for shortness of breath. Negative for cough.   Cardiovascular: Negative for chest pain and leg swelling.  Gastrointestinal: Positive for nausea, vomiting, abdominal pain and diarrhea.  Genitourinary: Negative for dysuria.  Musculoskeletal: Positive for back pain.  Skin: Negative for rash.  Neurological: Positive for dizziness and headaches.  Hematological: Does not bruise/bleed easily.  Psychiatric/Behavioral: Negative for confusion.      Allergies  Other  Home Medications   Prior to Admission medications   Medication Sig Start Date End Date Taking? Authorizing Provider  aspirin 325  MG tablet Take 1 tablet (325 mg total) by mouth daily. 03/21/14   Domenic Polite, MD  diazepam (VALIUM) 5 MG tablet Take 1 tablet (5 mg total) by mouth every 12 (twelve) hours as needed for muscle spasms. 07/30/13   Merryl Hacker, MD  EPINEPHrine (EPIPEN) 0.3 mg/0.3 mL SOAJ Inject 0.3 mLs (0.3 mg total) into the muscle as needed. Patient taking differently: Inject 0.3 mg into the muscle daily as needed (allergic reaction).  12/01/12   Teressa Lower, MD  levothyroxine (SYNTHROID, LEVOTHROID) 125 MCG tablet Take 2 tablets (250 mcg total) by mouth daily at 3 pm. 03/21/14   Domenic Polite, MD  methocarbamol (ROBAXIN) 500 MG tablet Take 2 tablets (1,000 mg total) by mouth 3 (three) times daily. Prn muscle spasms 10/13/13   Tammy L. Triplett, PA-C  nitroGLYCERIN (NITROSTAT) 0.4 MG SL tablet Place 0.4 mg under the tongue every 5 (five) minutes x 3 doses as needed for chest pain. 11/29/11 11/28/13  Kathie Dike, MD  olmesartan (BENICAR) 20 MG tablet Take 20 mg by mouth every evening.     Historical Provider, MD  oxyCODONE-acetaminophen (PERCOCET/ROXICET) 5-325 MG per tablet Take 1-2 tablets by mouth every 6 (six) hours as needed for severe pain. 07/30/13   Merryl Hacker, MD  oxyCODONE-acetaminophen (PERCOCET/ROXICET) 5-325 MG per tablet Take 1 tablet by mouth every 4 (four) hours as needed for severe pain. Patient not taking: Reported on 03/21/2014 10/13/13   Tammy L. Triplett, PA-C  ranitidine (ZANTAC) 75 MG tablet Take 75 mg by mouth daily.    Historical Provider, MD  zolpidem (AMBIEN) 10 MG tablet Take 10 mg by mouth every morning.     Historical Provider, MD   BP 127/77 mmHg  Pulse 58  Temp(Src) 98.7 F (37.1 C) (Oral)  Resp 18  Ht 6\' 1"  (1.854 m)  Wt 221 lb (100.245 kg)  BMI 29.16 kg/m2  SpO2 96% Physical Exam  Constitutional: He appears well-developed and well-nourished.  HENT:  Mouth/Throat: Oropharynx is clear and moist.  Eyes: Conjunctivae and EOM are normal. Pupils are equal, round, and  reactive to light.  Cardiovascular: Normal rate, regular rhythm and normal heart sounds.   Pulmonary/Chest: Effort normal and breath sounds normal.  Abdominal: Soft. Bowel sounds are normal. There is tenderness.  Tender LLQ  Musculoskeletal: Normal range of motion. He exhibits no edema.  Neurological: He has normal reflexes. No cranial nerve deficit. He exhibits normal muscle tone. Coordination normal.  Nursing note and vitals reviewed.   ED Course  Procedures (including critical care time) Results for orders placed or performed during the hospital encounter of 03/30/14  CBC with Differential  Result Value Ref Range   WBC 8.8 4.0 - 10.5  K/uL   RBC 5.01 4.22 - 5.81 MIL/uL   Hemoglobin 16.3 13.0 - 17.0 g/dL   HCT 46.0 39.0 - 52.0 %   MCV 91.8 78.0 - 100.0 fL   MCH 32.5 26.0 - 34.0 pg   MCHC 35.4 30.0 - 36.0 g/dL   RDW 12.9 11.5 - 15.5 %   Platelets 229 150 - 400 K/uL   Neutrophils Relative % 58 43 - 77 %   Neutro Abs 5.2 1.7 - 7.7 K/uL   Lymphocytes Relative 30 12 - 46 %   Lymphs Abs 2.6 0.7 - 4.0 K/uL   Monocytes Relative 8 3 - 12 %   Monocytes Absolute 0.7 0.1 - 1.0 K/uL   Eosinophils Relative 3 0 - 5 %   Eosinophils Absolute 0.2 0.0 - 0.7 K/uL   Basophils Relative 1 0 - 1 %   Basophils Absolute 0.1 0.0 - 0.1 K/uL  Basic metabolic panel  Result Value Ref Range   Sodium 137 137 - 147 mEq/L   Potassium 3.7 3.7 - 5.3 mEq/L   Chloride 98 96 - 112 mEq/L   CO2 25 19 - 32 mEq/L   Glucose, Bld 110 (H) 70 - 99 mg/dL   BUN 11 6 - 23 mg/dL   Creatinine, Ser 1.00 0.50 - 1.35 mg/dL   Calcium 9.8 8.4 - 10.5 mg/dL   GFR calc non Af Amer 88 (L) >90 mL/min   GFR calc Af Amer >90 >90 mL/min   Anion gap 14 5 - 15  Lipase, blood  Result Value Ref Range   Lipase 25 11 - 59 U/L   Dg Chest 2 View  03/21/2014   CLINICAL DATA:  48 year old male with shortness of breath and chest pain.  EXAM: CHEST  2 VIEW  COMPARISON:  Prior chest radiographs dating back to 04/21/2000  FINDINGS: The  cardiomediastinal silhouette is unremarkable.  There is no evidence of focal airspace disease, pulmonary edema, suspicious pulmonary nodule/mass, pleural effusion, or pneumothorax. No acute bony abnormalities are identified.  IMPRESSION: No active cardiopulmonary disease.   Electronically Signed   By: Hassan Rowan M.D.   On: 03/21/2014 14:13   Ct Head Wo Contrast  03/21/2014   CLINICAL DATA:  Left-sided facial droop, dizziness, nausea, vomiting  EXAM: CT HEAD WITHOUT CONTRAST  TECHNIQUE: Contiguous axial images were obtained from the base of the skull through the vertex without intravenous contrast.  COMPARISON:  05/28/2012  FINDINGS: There is no evidence of mass effect, midline shift or extra-axial fluid collections. There is no evidence of a space-occupying lesion or intracranial hemorrhage. There is no evidence of a cortical-based area of acute infarction.  The ventricles and sulci are appropriate for the patient's age. The basal cisterns are patent.  Visualized portions of the orbits are unremarkable. The visualized portions of the paranasal sinuses and mastoid air cells are unremarkable.  The osseous structures are unremarkable.  IMPRESSION: No acute intracranial pathology.  These results were called by telephone at the time of interpretation on 03/21/2014 at 4:03 am to Dr. Julianne Rice , who verbally acknowledged these results.   Electronically Signed   By: Kathreen Devoid   On: 03/21/2014 04:04   Mr Jodene Nam Head Wo Contrast  03/21/2014   CLINICAL DATA:  Acute onset of dizziness and vertigo with left facial numbness. Also left lower extremity numbness.  EXAM: MRI HEAD WITHOUT CONTRAST  MRA HEAD WITHOUT CONTRAST  TECHNIQUE: Multiplanar, multiecho pulse sequences of the brain and surrounding structures were obtained without intravenous  contrast. Angiographic images of the head were obtained using MRA technique without contrast.  COMPARISON:  Head CT same day  FINDINGS: MRI HEAD FINDINGS  The brain has a normal  appearance on all pulse sequences without evidence of malformation, atrophy, old or acute infarction, mass lesion, hemorrhage, hydrocephalus or extra-axial collection. No pituitary mass. No fluid in the sinuses, middle ears or mastoids. No skull or skullbase lesion. There is flow in the major vessels at the base of the brain. Major venous sinuses show flow.  MRA HEAD FINDINGS  Both internal carotid arteries are widely patent into the brain. No siphon stenosis. The anterior and middle cerebral vessels are normal without proximal stenosis, aneurysm or vascular malformation.  Both vertebral arteries are widely patent to the basilar. No basilar stenosis. Posterior circulation branch vessels are normal.  IMPRESSION: Normal MRI of the brain and normal intracranial MR angiography. No cause of the presenting symptoms is identified.   Electronically Signed   By: Nelson Chimes M.D.   On: 03/21/2014 12:47   Mr Brain Wo Contrast  03/21/2014   CLINICAL DATA:  Acute onset of dizziness and vertigo with left facial numbness. Also left lower extremity numbness.  EXAM: MRI HEAD WITHOUT CONTRAST  MRA HEAD WITHOUT CONTRAST  TECHNIQUE: Multiplanar, multiecho pulse sequences of the brain and surrounding structures were obtained without intravenous contrast. Angiographic images of the head were obtained using MRA technique without contrast.  COMPARISON:  Head CT same day  FINDINGS: MRI HEAD FINDINGS  The brain has a normal appearance on all pulse sequences without evidence of malformation, atrophy, old or acute infarction, mass lesion, hemorrhage, hydrocephalus or extra-axial collection. No pituitary mass. No fluid in the sinuses, middle ears or mastoids. No skull or skullbase lesion. There is flow in the major vessels at the base of the brain. Major venous sinuses show flow.  MRA HEAD FINDINGS  Both internal carotid arteries are widely patent into the brain. No siphon stenosis. The anterior and middle cerebral vessels are normal  without proximal stenosis, aneurysm or vascular malformation.  Both vertebral arteries are widely patent to the basilar. No basilar stenosis. Posterior circulation branch vessels are normal.  IMPRESSION: Normal MRI of the brain and normal intracranial MR angiography. No cause of the presenting symptoms is identified.   Electronically Signed   By: Nelson Chimes M.D.   On: 03/21/2014 12:47     Medications  0.9 %  sodium chloride infusion (1,000 mLs Intravenous New Bag/Given 03/30/14 2140)    Followed by  0.9 %  sodium chloride infusion (not administered)  ondansetron (ZOFRAN) injection 4 mg (4 mg Intravenous Given 03/30/14 2136)  HYDROmorphone (DILAUDID) injection 1 mg (1 mg Intravenous Given 03/30/14 2151)    10:55 PM- Pt advised of plan for treatment and pt agrees.   Labs Review Labs Reviewed  BASIC METABOLIC PANEL - Abnormal; Notable for the following:    Glucose, Bld 110 (*)    GFR calc non Af Amer 88 (*)    All other components within normal limits  CBC WITH DIFFERENTIAL  LIPASE, BLOOD  URINALYSIS, ROUTINE W REFLEX MICROSCOPIC   Results for orders placed or performed during the hospital encounter of 03/30/14  CBC with Differential  Result Value Ref Range   WBC 8.8 4.0 - 10.5 K/uL   RBC 5.01 4.22 - 5.81 MIL/uL   Hemoglobin 16.3 13.0 - 17.0 g/dL   HCT 46.0 39.0 - 52.0 %   MCV 91.8 78.0 - 100.0 fL   MCH 32.5  26.0 - 34.0 pg   MCHC 35.4 30.0 - 36.0 g/dL   RDW 12.9 11.5 - 15.5 %   Platelets 229 150 - 400 K/uL   Neutrophils Relative % 58 43 - 77 %   Neutro Abs 5.2 1.7 - 7.7 K/uL   Lymphocytes Relative 30 12 - 46 %   Lymphs Abs 2.6 0.7 - 4.0 K/uL   Monocytes Relative 8 3 - 12 %   Monocytes Absolute 0.7 0.1 - 1.0 K/uL   Eosinophils Relative 3 0 - 5 %   Eosinophils Absolute 0.2 0.0 - 0.7 K/uL   Basophils Relative 1 0 - 1 %   Basophils Absolute 0.1 0.0 - 0.1 K/uL  Basic metabolic panel  Result Value Ref Range   Sodium 137 137 - 147 mEq/L   Potassium 3.7 3.7 - 5.3 mEq/L    Chloride 98 96 - 112 mEq/L   CO2 25 19 - 32 mEq/L   Glucose, Bld 110 (H) 70 - 99 mg/dL   BUN 11 6 - 23 mg/dL   Creatinine, Ser 1.00 0.50 - 1.35 mg/dL   Calcium 9.8 8.4 - 10.5 mg/dL   GFR calc non Af Amer 88 (L) >90 mL/min   GFR calc Af Amer >90 >90 mL/min   Anion gap 14 5 - 15  Lipase, blood  Result Value Ref Range   Lipase 25 11 - 59 U/L     Imaging Review No results found.   EKG Interpretation None      MDM   Final diagnoses:  Abdominal pain in male    Patient with history of diverticulitis. Patient's had some abdominal discomfort in the left lower quadrant for several days. Patient with an acute vomiting and diarrhea illness not starting on Friday evening. Patient's labs without significant abnormalities. Patient had one episode of vomiting blood this had episodes since then but no blood. Bowel movements have actually been light brown in color no evidence of melena or maroon stools. Patient's labs are normal. No significant leukocytosis. No significant anemia. CT scan will direct further disposition clinically patient is tender in left lower quadrant. Highly suspicious for possible diverticulitis recurrence.  I personally performed the services described in this documentation, which was scribed in my presence. The recorded information has been reviewed and is accurate.    Fredia Sorrow, MD 03/30/14 2324   Patient has not yet gone to CT scan. CT will direct disposition. We'll turn patient over to the night physician Dr. Stark Jock.  Fredia Sorrow, MD 03/31/14 [phone]

## 2014-03-31 LAB — URINALYSIS, ROUTINE W REFLEX MICROSCOPIC
Bilirubin Urine: NEGATIVE
Glucose, UA: NEGATIVE mg/dL
Hgb urine dipstick: NEGATIVE
Ketones, ur: NEGATIVE mg/dL
Leukocytes, UA: NEGATIVE
Nitrite: NEGATIVE
Protein, ur: NEGATIVE mg/dL
Specific Gravity, Urine: 1.025 (ref 1.005–1.030)
Urobilinogen, UA: 0.2 mg/dL (ref 0.0–1.0)
pH: 5.5 (ref 5.0–8.0)

## 2014-03-31 MED ORDER — CIPROFLOXACIN HCL 250 MG PO TABS
500.0000 mg | ORAL_TABLET | Freq: Once | ORAL | Status: AC
  Administered 2014-03-31: 500 mg via ORAL
  Filled 2014-03-31: qty 2

## 2014-03-31 MED ORDER — CIPROFLOXACIN HCL 500 MG PO TABS: 500.0000 mg | ORAL_TABLET | Freq: Two times a day (BID) | ORAL | Status: DC

## 2014-03-31 MED ORDER — DIPHENHYDRAMINE HCL 50 MG/ML IJ SOLN
INTRAMUSCULAR | Status: AC
  Filled 2014-03-31: qty 1

## 2014-03-31 MED ORDER — ONDANSETRON 8 MG PO TBDP: ORAL_TABLET | ORAL | Status: DC

## 2014-03-31 MED ORDER — IOHEXOL 300 MG/ML  SOLN
100.0000 mL | Freq: Once | INTRAMUSCULAR | Status: AC | PRN
  Administered 2014-03-31: 100 mL via INTRAVENOUS

## 2014-03-31 MED ORDER — HYDROCODONE-ACETAMINOPHEN 5-325 MG PO TABS
2.0000 | ORAL_TABLET | Freq: Four times a day (QID) | ORAL | Status: DC | PRN
Start: 2014-03-31 — End: 2014-04-17

## 2014-03-31 MED ORDER — DIPHENHYDRAMINE HCL 50 MG/ML IJ SOLN
25.0000 mg | Freq: Once | INTRAMUSCULAR | Status: AC
  Administered 2014-03-31: 25 mg via INTRAVENOUS

## 2014-03-31 MED ORDER — METRONIDAZOLE 500 MG PO TABS
500.0000 mg | ORAL_TABLET | Freq: Once | ORAL | Status: AC
  Administered 2014-03-31: 500 mg via ORAL
  Filled 2014-03-31: qty 1

## 2014-03-31 MED ORDER — METRONIDAZOLE 500 MG PO TABS: 500.0000 mg | ORAL_TABLET | Freq: Three times a day (TID) | ORAL | Status: DC

## 2014-03-31 NOTE — ED Notes (Signed)
Much improved, hives gone. Itching almost gone. Pain free. Nausea still present but very mild.

## 2014-03-31 NOTE — ED Notes (Signed)
Scattered small hives noted on head, shoulders,back.

## 2014-03-31 NOTE — Discharge Instructions (Signed)
Cipro and Flagyl as prescribed.  Hydrocodone as prescribed as needed for pain.  Return to the emergency department if your symptoms substantially worsen or change.   Diverticulitis Diverticulitis is inflammation or infection of small pouches in your colon that form when you have a condition called diverticulosis. The pouches in your colon are called diverticula. Your colon, or large intestine, is where water is absorbed and stool is formed. Complications of diverticulitis can include:  Bleeding.  Severe infection.  Severe pain.  Perforation of your colon.  Obstruction of your colon. CAUSES  Diverticulitis is caused by bacteria. Diverticulitis happens when stool becomes trapped in diverticula. This allows bacteria to grow in the diverticula, which can lead to inflammation and infection. RISK FACTORS People with diverticulosis are at risk for diverticulitis. Eating a diet that does not include enough fiber from fruits and vegetables may make diverticulitis more likely to develop. SYMPTOMS  Symptoms of diverticulitis may include:  Abdominal pain and tenderness. The pain is normally located on the left side of the abdomen, but may occur in other areas.  Fever and chills.  Bloating.  Cramping.  Nausea.  Vomiting.  Constipation.  Diarrhea.  Blood in your stool. DIAGNOSIS  Your health care provider will ask you about your medical history and do a physical exam. You may need to have tests done because many medical conditions can cause the same symptoms as diverticulitis. Tests may include:  Blood tests.  Urine tests.  Imaging tests of the abdomen, including X-rays and CT scans. When your condition is under control, your health care provider may recommend that you have a colonoscopy. A colonoscopy can show how severe your diverticula are and whether something else is causing your symptoms. TREATMENT  Most cases of diverticulitis are mild and can be treated at home.  Treatment may include:  Taking over-the-counter pain medicines.  Following a clear liquid diet.  Taking antibiotic medicines by mouth for 7-10 days. More severe cases may be treated at a hospital. Treatment may include:  Not eating or drinking.  Taking prescription pain medicine.  Receiving antibiotic medicines through an IV tube.  Receiving fluids and nutrition through an IV tube.  Surgery. HOME CARE INSTRUCTIONS   Follow your health care provider's instructions carefully.  Follow a full liquid diet or other diet as directed by your health care provider. After your symptoms improve, your health care provider may tell you to change your diet. He or she may recommend you eat a high-fiber diet. Fruits and vegetables are good sources of fiber. Fiber makes it easier to pass stool.  Take fiber supplements or probiotics as directed by your health care provider.  Only take medicines as directed by your health care provider.  Keep all your follow-up appointments. SEEK MEDICAL CARE IF:   Your pain does not improve.  You have a hard time eating food.  Your bowel movements do not return to normal. SEEK IMMEDIATE MEDICAL CARE IF:   Your pain becomes worse.  Your symptoms do not get better.  Your symptoms suddenly get worse.  You have a fever.  You have repeated vomiting.  You have bloody or black, tarry stools. MAKE SURE YOU:   Understand these instructions.  Will watch your condition.  Will get help right away if you are not doing well or get worse. Document Released: 02/01/2005 Document Revised: 04/29/2013 Document Reviewed: 03/19/2013 [facility] Patient Information 2015 [place], [place]. This information is not intended to replace advice given to you by your health care  provider. Make sure you discuss any questions you have with your health care provider. ° °

## 2014-03-31 NOTE — ED Notes (Signed)
Pt noted itching upon completion of CT, medicated upon return to ED with benadryl 25mg .

## 2014-04-15 ENCOUNTER — Inpatient Hospital Stay (HOSPITAL_COMMUNITY)
Admission: EM | Admit: 2014-04-15 | Discharge: 2014-04-17 | DRG: 392 | Disposition: A | Discharge status: Home / Self Care | Payer: 59 | Attending: Family Medicine | Admitting: Family Medicine

## 2014-04-15 ENCOUNTER — Emergency Department (HOSPITAL_COMMUNITY): Payer: 59

## 2014-04-15 ENCOUNTER — Encounter (HOSPITAL_COMMUNITY): Payer: Self-pay | Admitting: *Deleted

## 2014-04-15 DIAGNOSIS — E785 Hyperlipidemia, unspecified: Secondary | ICD-10-CM | POA: Diagnosis present

## 2014-04-15 DIAGNOSIS — R1032 Left lower quadrant pain: Secondary | ICD-10-CM | POA: Diagnosis not present

## 2014-04-15 DIAGNOSIS — Z7982 Long term (current) use of aspirin: Secondary | ICD-10-CM

## 2014-04-15 DIAGNOSIS — E669 Obesity, unspecified: Secondary | ICD-10-CM | POA: Diagnosis present

## 2014-04-15 DIAGNOSIS — K2970 Gastritis, unspecified, without bleeding: Secondary | ICD-10-CM | POA: Insufficient documentation

## 2014-04-15 DIAGNOSIS — K5732 Diverticulitis of large intestine without perforation or abscess without bleeding: Secondary | ICD-10-CM | POA: Diagnosis present

## 2014-04-15 DIAGNOSIS — Z9079 Acquired absence of other genital organ(s): Secondary | ICD-10-CM | POA: Diagnosis present

## 2014-04-15 DIAGNOSIS — I10 Essential (primary) hypertension: Secondary | ICD-10-CM | POA: Diagnosis present

## 2014-04-15 DIAGNOSIS — R112 Nausea with vomiting, unspecified: Secondary | ICD-10-CM | POA: Diagnosis present

## 2014-04-15 DIAGNOSIS — I2510 Atherosclerotic heart disease of native coronary artery without angina pectoris: Secondary | ICD-10-CM | POA: Diagnosis present

## 2014-04-15 DIAGNOSIS — Z6829 Body mass index (BMI) 29.0-29.9, adult: Secondary | ICD-10-CM | POA: Diagnosis not present

## 2014-04-15 DIAGNOSIS — Z87891 Personal history of nicotine dependence: Secondary | ICD-10-CM | POA: Diagnosis not present

## 2014-04-15 DIAGNOSIS — G4730 Sleep apnea, unspecified: Secondary | ICD-10-CM | POA: Diagnosis present

## 2014-04-15 DIAGNOSIS — Z880 Allergy status to penicillin: Secondary | ICD-10-CM | POA: Diagnosis not present

## 2014-04-15 DIAGNOSIS — K449 Diaphragmatic hernia without obstruction or gangrene: Secondary | ICD-10-CM | POA: Diagnosis present

## 2014-04-15 DIAGNOSIS — R1310 Dysphagia, unspecified: Secondary | ICD-10-CM | POA: Diagnosis present

## 2014-04-15 DIAGNOSIS — E890 Postprocedural hypothyroidism: Secondary | ICD-10-CM | POA: Diagnosis present

## 2014-04-15 DIAGNOSIS — K2990 Gastroduodenitis, unspecified, without bleeding: Secondary | ICD-10-CM | POA: Diagnosis present

## 2014-04-15 DIAGNOSIS — M1990 Unspecified osteoarthritis, unspecified site: Secondary | ICD-10-CM | POA: Diagnosis present

## 2014-04-15 DIAGNOSIS — L409 Psoriasis, unspecified: Secondary | ICD-10-CM | POA: Diagnosis present

## 2014-04-15 DIAGNOSIS — R0989 Other specified symptoms and signs involving the circulatory and respiratory systems: Secondary | ICD-10-CM | POA: Diagnosis present

## 2014-04-15 DIAGNOSIS — K920 Hematemesis: Secondary | ICD-10-CM | POA: Diagnosis present

## 2014-04-15 DIAGNOSIS — F5104 Psychophysiologic insomnia: Secondary | ICD-10-CM | POA: Diagnosis present

## 2014-04-15 DIAGNOSIS — K222 Esophageal obstruction: Secondary | ICD-10-CM | POA: Diagnosis present

## 2014-04-15 DIAGNOSIS — Z9049 Acquired absence of other specified parts of digestive tract: Secondary | ICD-10-CM | POA: Diagnosis present

## 2014-04-15 DIAGNOSIS — R42 Dizziness and giddiness: Secondary | ICD-10-CM

## 2014-04-15 DIAGNOSIS — K210 Gastro-esophageal reflux disease with esophagitis: Secondary | ICD-10-CM | POA: Diagnosis not present

## 2014-04-15 DIAGNOSIS — Z96641 Presence of right artificial hip joint: Secondary | ICD-10-CM | POA: Diagnosis present

## 2014-04-15 DIAGNOSIS — G8929 Other chronic pain: Secondary | ICD-10-CM | POA: Diagnosis present

## 2014-04-15 DIAGNOSIS — R6889 Other general symptoms and signs: Secondary | ICD-10-CM | POA: Diagnosis present

## 2014-04-15 DIAGNOSIS — Z86010 Personal history of colonic polyps: Secondary | ICD-10-CM | POA: Diagnosis not present

## 2014-04-15 LAB — COMPREHENSIVE METABOLIC PANEL
ALT: 41 U/L (ref 0–53)
AST: 25 U/L (ref 0–37)
Albumin: 4.4 g/dL (ref 3.5–5.2)
Alkaline Phosphatase: 82 U/L (ref 39–117)
Anion gap: 13 (ref 5–15)
BUN: 13 mg/dL (ref 6–23)
CO2: 25 mEq/L (ref 19–32)
Calcium: 9.8 mg/dL (ref 8.4–10.5)
Chloride: 102 mEq/L (ref 96–112)
Creatinine, Ser: 0.9 mg/dL (ref 0.50–1.35)
GFR calc Af Amer: >90 mL/min (ref >90)
GFR calc non Af Amer: >90 mL/min (ref >90)
Glucose, Bld: 104 mg/dL — ABNORMAL HIGH (ref 70–99)
Potassium: 3.8 mEq/L (ref 3.7–5.3)
Sodium: 140 mEq/L (ref 137–147)
Total Bilirubin: 0.8 mg/dL (ref 0.3–1.2)
Total Protein: 7.7 g/dL (ref 6.0–8.3)

## 2014-04-15 LAB — CBC WITH DIFFERENTIAL/PLATELET
Basophils Absolute: 0.1 10*3/uL (ref 0.0–0.1)
Basophils Relative: 1 % (ref 0–1)
Eosinophils Absolute: 0.3 10*3/uL (ref 0.0–0.7)
Eosinophils Relative: 3 % (ref 0–5)
HCT: 44.5 % (ref 39.0–52.0)
Hemoglobin: 15.8 g/dL (ref 13.0–17.0)
Lymphocytes Relative: 26 % (ref 12–46)
Lymphs Abs: 2.3 10*3/uL (ref 0.7–4.0)
MCH: 32.7 pg (ref 26.0–34.0)
MCHC: 35.5 g/dL (ref 30.0–36.0)
MCV: 92.1 fL (ref 78.0–100.0)
Monocytes Absolute: 0.9 10*3/uL (ref 0.1–1.0)
Monocytes Relative: 10 % (ref 3–12)
Neutro Abs: 5.4 10*3/uL (ref 1.7–7.7)
Neutrophils Relative %: 60 % (ref 43–77)
Platelets: 225 10*3/uL (ref 150–400)
RBC: 4.83 MIL/uL (ref 4.22–5.81)
RDW: 13.2 % (ref 11.5–15.5)
WBC: 8.9 10*3/uL (ref 4.0–10.5)

## 2014-04-15 LAB — CBC
HCT: 38.5 % — ABNORMAL LOW (ref 39.0–52.0)
Hemoglobin: 13.7 g/dL (ref 13.0–17.0)
MCH: 32.6 pg (ref 26.0–34.0)
MCHC: 35.6 g/dL (ref 30.0–36.0)
MCV: 91.7 fL (ref 78.0–100.0)
Platelets: 176 10*3/uL (ref 150–400)
RBC: 4.2 MIL/uL — ABNORMAL LOW (ref 4.22–5.81)
RDW: 12.9 % (ref 11.5–15.5)
WBC: 5.9 10*3/uL (ref 4.0–10.5)

## 2014-04-15 LAB — TROPONIN I: Troponin I: <0.3 ng/mL (ref <0.30)

## 2014-04-15 MED ORDER — ONDANSETRON HCL 4 MG/2ML IJ SOLN
4.0000 mg | Freq: Once | INTRAMUSCULAR | Status: AC
  Administered 2014-04-15: 4 mg via INTRAVENOUS
  Filled 2014-04-15: qty 2

## 2014-04-15 MED ORDER — IRBESARTAN 75 MG PO TABS
75.0000 mg | ORAL_TABLET | Freq: Every day | ORAL | Status: DC
  Administered 2014-04-16 – 2014-04-17 (×2): 75 mg via ORAL
  Filled 2014-04-15 (×2): qty 1

## 2014-04-15 MED ORDER — ZOLPIDEM TARTRATE 5 MG PO TABS
10.0000 mg | ORAL_TABLET | Freq: Every day | ORAL | Status: DC
  Administered 2014-04-15 – 2014-04-16 (×2): 10 mg via ORAL
  Filled 2014-04-15 (×2): qty 2

## 2014-04-15 MED ORDER — ALUM & MAG HYDROXIDE-SIMETH 200-200-20 MG/5ML PO SUSP: 30.0000 mL | Freq: Four times a day (QID) | ORAL | Status: DC | PRN

## 2014-04-15 MED ORDER — SODIUM CHLORIDE 0.9 % IV SOLN
1000.0000 mL | INTRAVENOUS | Status: DC
  Administered 2014-04-15 – 2014-04-16 (×3): 1000 mL via INTRAVENOUS

## 2014-04-15 MED ORDER — SODIUM CHLORIDE 0.9 % IV SOLN
1000.0000 mL | Freq: Once | INTRAVENOUS | Status: AC
  Administered 2014-04-15: 1000 mL via INTRAVENOUS

## 2014-04-15 MED ORDER — LORAZEPAM 2 MG/ML IJ SOLN
1.0000 mg | Freq: Once | INTRAMUSCULAR | Status: AC
  Administered 2014-04-15: 1 mg via INTRAVENOUS
  Filled 2014-04-15: qty 1

## 2014-04-15 MED ORDER — CIPROFLOXACIN IN D5W 400 MG/200ML IV SOLN
400.0000 mg | Freq: Once | INTRAVENOUS | Status: AC
  Administered 2014-04-15: 400 mg via INTRAVENOUS
  Filled 2014-04-15: qty 200

## 2014-04-15 MED ORDER — HYDROMORPHONE HCL 1 MG/ML IJ SOLN
0.5000 mg | INTRAMUSCULAR | Status: DC | PRN
  Administered 2014-04-16 (×4): 0.5 mg via INTRAVENOUS
  Filled 2014-04-15 (×4): qty 1

## 2014-04-15 MED ORDER — CIPROFLOXACIN IN D5W 400 MG/200ML IV SOLN
400.0000 mg | Freq: Two times a day (BID) | INTRAVENOUS | Status: DC
  Administered 2014-04-16 – 2014-04-17 (×3): 400 mg via INTRAVENOUS
  Filled 2014-04-15 (×3): qty 200

## 2014-04-15 MED ORDER — METRONIDAZOLE IN NACL 5-0.79 MG/ML-% IV SOLN
500.0000 mg | Freq: Three times a day (TID) | INTRAVENOUS | Status: DC
  Administered 2014-04-15 – 2014-04-17 (×5): 500 mg via INTRAVENOUS
  Filled 2014-04-15 (×6): qty 100

## 2014-04-15 MED ORDER — ACETAMINOPHEN 325 MG PO TABS
650.0000 mg | ORAL_TABLET | Freq: Four times a day (QID) | ORAL | Status: DC | PRN
Start: 2014-04-15 — End: 2014-04-17

## 2014-04-15 MED ORDER — METRONIDAZOLE IN NACL 5-0.79 MG/ML-% IV SOLN
500.0000 mg | Freq: Once | INTRAVENOUS | Status: AC
  Administered 2014-04-15: 500 mg via INTRAVENOUS
  Filled 2014-04-15: qty 100

## 2014-04-15 MED ORDER — PROMETHAZINE HCL 25 MG/ML IJ SOLN
12.5000 mg | Freq: Once | INTRAMUSCULAR | Status: AC
  Administered 2014-04-15: 12.5 mg via INTRAVENOUS

## 2014-04-15 MED ORDER — ACETAMINOPHEN 650 MG RE SUPP: 650.0000 mg | Freq: Four times a day (QID) | RECTAL | Status: DC | PRN

## 2014-04-15 MED ORDER — PROMETHAZINE HCL 12.5 MG PO TABS: 12.5000 mg | ORAL_TABLET | Freq: Three times a day (TID) | ORAL | Status: DC | PRN

## 2014-04-15 MED ORDER — FAMOTIDINE IN NACL 20-0.9 MG/50ML-% IV SOLN
20.0000 mg | Freq: Once | INTRAVENOUS | Status: AC
  Administered 2014-04-15: 20 mg via INTRAVENOUS
  Filled 2014-04-15: qty 50

## 2014-04-15 MED ORDER — PANTOPRAZOLE SODIUM 40 MG IV SOLR
40.0000 mg | Freq: Once | INTRAVENOUS | Status: AC
  Administered 2014-04-15: 40 mg via INTRAVENOUS
  Filled 2014-04-15: qty 40

## 2014-04-15 MED ORDER — HYDROCODONE-ACETAMINOPHEN 5-325 MG PO TABS
1.0000 | ORAL_TABLET | ORAL | Status: DC | PRN
  Administered 2014-04-16: 2 via ORAL
  Filled 2014-04-15: qty 2

## 2014-04-15 MED ORDER — PROMETHAZINE HCL 25 MG/ML IJ SOLN
INTRAMUSCULAR | Status: AC
  Filled 2014-04-15: qty 1

## 2014-04-15 MED ORDER — ONDANSETRON HCL 4 MG/2ML IJ SOLN
4.0000 mg | INTRAMUSCULAR | Status: DC
  Administered 2014-04-15 – 2014-04-17 (×9): 4 mg via INTRAVENOUS
  Filled 2014-04-15 (×9): qty 2

## 2014-04-15 MED ORDER — LEVOTHYROXINE SODIUM 100 MCG PO TABS
300.0000 ug | ORAL_TABLET | Freq: Every day | ORAL | Status: DC
  Administered 2014-04-16: 300 ug via ORAL
  Filled 2014-04-15: qty 3

## 2014-04-15 NOTE — H&P (Signed)
[facility] History and Physical  Andrew Love:[id number] DOB: [birth year] DOA: 04/15/2014  Referring physician: cook PCP: [name], MD   Chief Complaint: hematemesis/intractable nausea/vomiting/persistent LLQ abdominal pain  HPI: Andrew Love is a 48 y.o. male with a past medical history that includes CAD, diverticulitis, thyroid disease status post thyroidectomy, hypertension, vertigo, colitis s/p resection in 2010, presents to the emergency department with the chief complaint of hematemesis and intractable nausea and vomiting. Initial evaluation in the emergency room concerning for upper GI bleed or more likely recurrence of colitis/diverticulitis. Patient reports that 2 weeks ago he was diagnosed with diverticulitis and completed a course of Cipro and Flagyl. During that time his nausea and abdominal pain gradually improved. Yesterday he developed worsening nausea and estimates 15 episodes of emesis since 8:00 last night. He reports the first 2 with bright red blood. After that emesis consisted of gree  bile substance. He had eaten 8 hours earlier some barbecue. Associated symptoms include abdominal pain particularly in the left lower quadrant intermittent dizziness characteristics consistent with vertigo and a sense of fullness in his throat. He denies fever chills headache. Denies diarrhea constipation bright red blood per rectum or melena. He denies dysuria hematuria frequency or urgency. Workup in the emergency room includes a complete metabolic panel did is unremarkable. Presentation he is hemodynamically stable and not hypoxic. In the emergency department he receives 1 L of normal saline, Pepcid IV, Ativan IV, Flagyl IV, Zofran, Protonix, and Phenergan. At the time of my exam he continued to complain of nausea however it had improved. Acute abdominal x-ray with chest pending.  Review of Systems:  10 point review of systems complete and all systems are negative  except as indicated in the history of present illness  Past Medical History  Diagnosis Date  . CAD (coronary artery disease)   . Elevated liver enzymes   . Diverticulitis     Hx of; requiring 3 admissions  . Sigmoid colon ulcer     Rectal polyps  . Thyroid disease   . Psoriasis     Per medical history form dated 06/13/10.  . Chronic insomnia     Per medical history form dated 06/13/10.  . Colitis     Per medical history from dated 06/13/10.  Marland Kitchen Hypertension   . Hypothyroidism   . Bronchitis     history of  . Back pain     chronic  . Arthritis   . 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 Sleep apnea with use of continuous positive airway pressure (CPAP)    Past Surgical History  Procedure Laterality Date  . Colonoscopy  07/2008    Colitis,NSAID v. Ischemia,malrotation of the gut,Diverticulosis(L),hyperplastic  . Appendectomy    . Shoulder surgery      Left  . Rotator cuff repair      Left - per medical history form dated 06/13/10.  . Thyroidectomy      Per medical history form dated 06/13/10.  . Colon surgery  2012  . Total hip arthroplasty  07/04/2011    Procedure: TOTAL HIP ARTHROPLASTY;  Surgeon: Johnny Bridge, MD;  Location: [facility];  Service: Orthopedics;  Laterality: Right;  . Joint replacement    . Cardiac catheterization  2009  . Cardiac catheterization      Per medical history from dated 06/13/10.  . Colonoscopy N/A 12/13/2012    Procedure: COLONOSCOPY;  Surgeon: Danie Binder, MD;  [facility];  Service: Endoscopy;  Laterality: N/A;  10:00AM-rescheduled to 11:15 Doris notified pt   Social History:  reports that he has quit smoking. He has never used smokeless tobacco. He reports that he drinks alcohol. He reports that he does not use illicit drugs. His married he lives at home with his wife he is employed as a Programmer, applications. He is independent with ADLs Allergies  Allergen Reactions  . Other Hives, Shortness Of Breath and Itching    Insect  bite- bug name unknown=  . Iohexol Hives    Family History  Problem Relation Age of Onset  . Cancer Mother     breast cancer - per medical history form dated 06/13/10.  . Diverticulitis Mother   . Hypertension Mother   . Cancer Father     skin - per medical history form dated 06/13/10.  Marland Kitchen Hypertension Father   . Anesthesia problems Neg Hx   . Hypotension Neg Hx   . Malignant hyperthermia Neg Hx   . Pseudochol deficiency Neg Hx      Prior to Admission medications   Medication Sig Start Date End Date Taking? Authorizing Provider  aspirin 325 MG tablet Take 1 tablet (325 mg total) by mouth daily. 03/21/14  Yes Domenic Polite, MD  levothyroxine (SYNTHROID, LEVOTHROID) 300 MCG tablet Take 300 mcg by mouth daily. 03/26/14  Yes Historical Provider, MD  olmesartan (BENICAR) 20 MG tablet Take 20 mg by mouth every evening.    Yes Historical Provider, MD  zolpidem (AMBIEN) 10 MG tablet Take 10 mg by mouth every morning.    Yes Historical Provider, MD  ciprofloxacin (CIPRO) 500 MG tablet Take 1 tablet (500 mg total) by mouth 2 (two) times daily. One po bid x 7 days Patient not taking: Reported on 04/15/2014 03/31/14   Veryl Speak, MD  diazepam (VALIUM) 5 MG tablet Take 1 tablet (5 mg total) by mouth every 12 (twelve) hours as needed for muscle spasms. Patient not taking: Reported on 04/15/2014 07/30/13   Merryl Hacker, MD  EPINEPHrine (EPIPEN) 0.3 mg/0.3 mL SOAJ Inject 0.3 mLs (0.3 mg total) into the muscle as needed. Patient taking differently: Inject 0.3 mg into the muscle daily as needed (allergic reaction).  12/01/12   Teressa Lower, MD  HYDROcodone-acetaminophen (NORCO) 5-325 MG per tablet Take 2 tablets by mouth every 6 (six) hours as needed. Patient not taking: Reported on 04/15/2014 03/31/14   Veryl Speak, MD  levothyroxine (SYNTHROID, LEVOTHROID) 125 MCG tablet Take 2 tablets (250 mcg total) by mouth daily at 3 pm. Patient not taking: Reported on 04/15/2014 03/21/14   Domenic Polite, MD    methocarbamol (ROBAXIN) 500 MG tablet Take 2 tablets (1,000 mg total) by mouth 3 (three) times daily. Prn muscle spasms Patient not taking: Reported on 04/15/2014 10/13/13   Tammy L. Triplett, PA-C  metroNIDAZOLE (FLAGYL) 500 MG tablet Take 1 tablet (500 mg total) by mouth 3 (three) times daily. One po bid x 7 days Patient not taking: Reported on 04/15/2014 03/31/14   Veryl Speak, MD  ondansetron (ZOFRAN ODT) 8 MG disintegrating tablet 8mg  ODT q4 hours prn nausea Patient not taking: Reported on 04/15/2014 03/31/14   Veryl Speak, MD  oxyCODONE-acetaminophen (PERCOCET/ROXICET) 5-325 MG per tablet Take 1-2 tablets by mouth every 6 (six) hours as needed for severe pain. Patient not taking: Reported on 04/15/2014 07/30/13   Merryl Hacker, MD  oxyCODONE-acetaminophen (PERCOCET/ROXICET) 5-325 MG per tablet Take 1 tablet by mouth every 4 (four) hours as needed for severe pain. Patient not taking:  Reported on 03/21/2014 10/13/13   Tammy L. Triplett, PA-C   Physical Exam: Filed Vitals:   04/15/14 1200 04/15/14 1215 04/15/14 1230 04/15/14 1245  BP: 117/70  134/82   Pulse: 70 78 71 64  Temp:      TempSrc:      Resp: 20 24 24 17   Height:      Weight:      SpO2: 98% 96% 96% 99%    Wt Readings from Last 3 Encounters:  04/15/14 100.245 kg (221 lb)  03/30/14 100.245 kg (221 lb)  03/21/14 105 kg (231 lb 7.7 oz)    General:  Appears calm and comfortable Eyes: PERRL, normal lids, irises & conjunctiva ENT: grossly normal hearing, weakness membranes of his mouth are pink slightly dry Neck: no LAD, masses or thyromegaly Cardiovascular: RRR, no m/r/g. No LE edema. Pulses present and palpable Respiratory: CTA bilaterally, no w/r/r. Normal respiratory effort. Abdomen: soft, nondistended, very sluggish bowel sounds, profound tenderness in left lower quadrant Skin: no rash or induration seen on limited exam Musculoskeletal: grossly normal tone BUE/BLE Psychiatric: grossly normal mood and affect, speech  fluent and appropriate Neurologic: grossly non-focal. speech clear           Labs on Admission:  Basic Metabolic Panel:  Recent Labs Lab 04/15/14 0953  NA 140  K 3.8  CL 102  CO2 25  GLUCOSE 104*  BUN 13  CREATININE 0.90  CALCIUM 9.8   Liver Function Tests:  Recent Labs Lab 04/15/14 0953  AST 25  ALT 41  ALKPHOS 82  BILITOT 0.8  PROT 7.7  ALBUMIN 4.4   No results for input(s): LIPASE, AMYLASE in the last 168 hours. No results for input(s): AMMONIA in the last 168 hours. CBC:  Recent Labs Lab 04/15/14 0953  WBC 8.9  NEUTROABS 5.4  HGB 15.8  HCT 44.5  MCV 92.1  PLT 225   Cardiac Enzymes:  Recent Labs Lab 04/15/14 0957  TROPONINI <0.30    BNP (last 3 results) No results for input(s): PROBNP in the last 8760 hours. CBG: No results for input(s): GLUCAP in the last 168 hours.  Radiological Exams on Admission: No results found.  EKG:   Assessment/Plan Principal Problem:   Hematemesis: Etiology uncertain. No history of NSAID use no aspirin. Only 2 episodes in the setting of persistent vomiting and first 2 episodes at that. Complains of sensation of throat fullness as well. No report of bright red blood per rectum or melena. Hemoglobin stable. Will obtain FOBT. Request GI consult. Provide clear liquids for now. Active Problems:  nausea and vomiting. Intractable. May be related to upper GI bleed versus recurrence colitis/diverticulitis in setting of persistent mild vertigo. Will provide Zofran scheduled. Will use Phenergan for breakthrough only. I requested GI assistance. Consider meclizine if persistent as well as PT    Abdominal pain, left lower quadrant: Recent CT with inflammation consistent with colitis/diverticulitis. He did complete a ten-day course of Cipro and Flagyl this may be a recurrence. Very tender on exam. Await results of acute abdominal x-ray. Continue Cipro and Flagyl that was initiated in the emergency department. No diarrhea as a matter  fact no stool at all for the last 2 days. Provide pain medicine monitor    Dizziness/vertigo: Patient reports persistent mild symptoms consistent with vertigo. He has a history of the same. Has been on valium in past. worse with movement of head. No nystagmus noted    CAD (coronary artery disease) no chest pain. 2006 cath with  30% blockage in mid segment or RCA per chart. 09/2012 normal stress test.  Echo 2014 with EF 55%    Essential hypertension: controlled     HLD (hyperlipidemia): obtain lipid panel.   Thyroid disease: obtain TSH as dose of synthroid changed last month.   Throat fullness: etiology unclear.  has hx GERD. Likely result of persistent vomiting. Will manage n/v and reassess     Code Status: full DVT Prophylaxis: Family Communication: wife at bedside Disposition Plan: home when ready  Time spent: [facility] Pager [phone]

## 2014-04-15 NOTE — Plan of Care (Signed)
Problem: Consults Goal: General Medical Patient Education See Patient Education Module for specific education.  Outcome: Completed/Met Date Met:  04/15/14     

## 2014-04-15 NOTE — ED Notes (Signed)
Pt states he woke up vomiting green bile and vomited blood twice, bright red in color (described as streaks of bright red blood, when he first began vomiting, then vomited the green bile). Unable to keep fluids down. Vomiting began at 0200. abdominal Pain and nausea began at 2000 last night. Pt recently seen in the ED for diverticulitis. Sent over by Dr. Gerarda Fraction for further testing. Pt also states dizziness.

## 2014-04-15 NOTE — Plan of Care (Signed)
Problem: Consults Goal: Skin Care Protocol Initiated - if Braden Score 18 or less If consults are not indicated, leave blank or document N/A  Outcome: Not Applicable Date Met:  04/15/14     

## 2014-04-15 NOTE — Plan of Care (Signed)
Problem: Consults Goal: Nutrition Consult-if indicated Outcome: Not Applicable Date Met:  04/15/14     

## 2014-04-15 NOTE — ED Provider Notes (Addendum)
CSN: [id number]     Arrival date & time 04/15/14  1950 History  This chart was scribed for Nat Christen, MD by Stephania Fragmin, ED Scribe. This patient was seen in room [facility] and the patient's care was started at 9:59 AM.    Chief Complaint  Patient presents with  . Hemoptysis   The history is provided by the patient. No language interpreter was used.     HPI Comments: Andrew Love is a 48 y.o. male who presents to the Emergency Department complaining of several severe episodes of emesis that began about 8 hours ago. Patient ate dinner about 14 hours ago, felt unwell about 3 hours later, and took 2 Rolaids with no relief. Nine hours after dinner, he vomited a small amount of blood - "about 2 mouthfuls" - and has had several episodes of vomiting green bile since. He saw Dr. Gerarda Fraction who referred him to the ED today. Patient complains of associated epigastric and LLQ abdominal pain, dizziness, and nausea that began 4-5 hours ago. He was admitted to the ED about 2 weeks ago where a CT scan showed acute diverticulitis, and was discharged with a 10 day prescription of Flagyl and Cipro. Per wife, patient has a history of vertigo. Dr. Dalbert Batman performed patient's partial bowel resection. Patient has worked at a [employer] in [place] for 19 years.  Patient also complains of a choking sensation on his neck where his thyroid used to be.   Past Medical History  Diagnosis Date  . CAD (coronary artery disease)   . Elevated liver enzymes   . Diverticulitis     Hx of; requiring 3 admissions  . Sigmoid colon ulcer     Rectal polyps  . Thyroid disease   . Psoriasis     Per medical history form dated 06/13/10.  . Chronic insomnia     Per medical history form dated 06/13/10.  . Colitis     Per medical history from dated 06/13/10.  Marland Kitchen Hypertension   . Hypothyroidism   . Bronchitis     history of  . Back pain     chronic  . Arthritis   . 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 Sleep  apnea with use of continuous positive airway pressure (CPAP)    Past Surgical History  Procedure Laterality Date  . Colonoscopy  07/2008    Colitis,NSAID v. Ischemia,malrotation of the gut,Diverticulosis(L),hyperplastic  . Appendectomy    . Shoulder surgery      Left  . Rotator cuff repair      Left - per medical history form dated 06/13/10.  . Thyroidectomy      Per medical history form dated 06/13/10.  . Colon surgery  2012  . Total hip arthroplasty  07/04/2011    Procedure: TOTAL HIP ARTHROPLASTY;  Surgeon: Johnny Bridge, MD;  Location: [facility];  Service: Orthopedics;  Laterality: Right;  . Joint replacement    . Cardiac catheterization  2009  . Cardiac catheterization      Per medical history from dated 06/13/10.  . Colonoscopy N/A 12/13/2012    Procedure: COLONOSCOPY;  Surgeon: Danie Binder, MD;  [facility];  Service: Endoscopy;  Laterality: N/A;  10:00AM-rescheduled to 11:15 Doris notified pt   Family History  Problem Relation Age of Onset  . Cancer Mother     breast cancer - per medical history form dated 06/13/10.  . Diverticulitis Mother   . Hypertension Mother   . Cancer Father  skin - per medical history form dated 06/13/10.  Marland Kitchen Hypertension Father   . Anesthesia problems Neg Hx   . Hypotension Neg Hx   . Malignant hyperthermia Neg Hx   . Pseudochol deficiency Neg Hx    History  Substance Use Topics  . Smoking status: Former Smoker -- 0.25 packs/day for 18 years  . Smokeless tobacco: Never Used     Comment: Has been wearing patch for 20 days  . Alcohol Use: Yes     Comment: Occasional    Review of Systems  Gastrointestinal: Positive for nausea, vomiting and abdominal pain.  Neurological: Positive for dizziness.  All other systems reviewed and are negative.     Allergies  Other and Iohexol  Home Medications   Prior to Admission medications   Medication Sig Start Date End Date Taking? Authorizing Provider  aspirin 325 MG tablet Take 1 tablet  (325 mg total) by mouth daily. 03/21/14  Yes Domenic Polite, MD  levothyroxine (SYNTHROID, LEVOTHROID) 300 MCG tablet Take 300 mcg by mouth daily. 03/26/14  Yes Historical Provider, MD  olmesartan (BENICAR) 20 MG tablet Take 20 mg by mouth every evening.    Yes Historical Provider, MD  zolpidem (AMBIEN) 10 MG tablet Take 10 mg by mouth every morning.    Yes Historical Provider, MD  ciprofloxacin (CIPRO) 500 MG tablet Take 1 tablet (500 mg total) by mouth 2 (two) times daily. One po bid x 7 days Patient not taking: Reported on 04/15/2014 03/31/14   Veryl Speak, MD  diazepam (VALIUM) 5 MG tablet Take 1 tablet (5 mg total) by mouth every 12 (twelve) hours as needed for muscle spasms. Patient not taking: Reported on 04/15/2014 07/30/13   Merryl Hacker, MD  EPINEPHrine (EPIPEN) 0.3 mg/0.3 mL SOAJ Inject 0.3 mLs (0.3 mg total) into the muscle as needed. Patient taking differently: Inject 0.3 mg into the muscle daily as needed (allergic reaction).  12/01/12   Teressa Lower, MD  HYDROcodone-acetaminophen (NORCO) 5-325 MG per tablet Take 2 tablets by mouth every 6 (six) hours as needed. Patient not taking: Reported on 04/15/2014 03/31/14   Veryl Speak, MD  levothyroxine (SYNTHROID, LEVOTHROID) 125 MCG tablet Take 2 tablets (250 mcg total) by mouth daily at 3 pm. Patient not taking: Reported on 04/15/2014 03/21/14   Domenic Polite, MD  methocarbamol (ROBAXIN) 500 MG tablet Take 2 tablets (1,000 mg total) by mouth 3 (three) times daily. Prn muscle spasms Patient not taking: Reported on 04/15/2014 10/13/13   Tammy L. Triplett, PA-C  metroNIDAZOLE (FLAGYL) 500 MG tablet Take 1 tablet (500 mg total) by mouth 3 (three) times daily. One po bid x 7 days Patient not taking: Reported on 04/15/2014 03/31/14   Veryl Speak, MD  ondansetron (ZOFRAN ODT) 8 MG disintegrating tablet 8mg  ODT q4 hours prn nausea Patient not taking: Reported on 04/15/2014 03/31/14   Veryl Speak, MD  oxyCODONE-acetaminophen (PERCOCET/ROXICET)  5-325 MG per tablet Take 1-2 tablets by mouth every 6 (six) hours as needed for severe pain. Patient not taking: Reported on 04/15/2014 07/30/13   Merryl Hacker, MD  oxyCODONE-acetaminophen (PERCOCET/ROXICET) 5-325 MG per tablet Take 1 tablet by mouth every 4 (four) hours as needed for severe pain. Patient not taking: Reported on 03/21/2014 10/13/13   Tammy L. Triplett, PA-C   BP 134/82 mmHg  Pulse 64  Temp(Src) 98.1 F (36.7 C) (Oral)  Resp 17  Ht 6\' 1"  (1.854 m)  Wt 221 lb (100.245 kg)  BMI 29.16 kg/m2  SpO2 99% Physical  Exam  Constitutional: He is oriented to person, place, and time. He appears well-developed and well-nourished.  HENT:  Head: Normocephalic and atraumatic.  Eyes: Conjunctivae and EOM are normal. Pupils are equal, round, and reactive to light.  Neck: Normal range of motion. Neck supple.  Cardiovascular: Normal rate, regular rhythm and normal heart sounds.   Pulmonary/Chest: Effort normal and breath sounds normal.  Abdominal: Soft. Bowel sounds are normal.  Tenderness to epigastrium and LLQ.  Musculoskeletal: Normal range of motion.  Neurological: He is alert and oriented to person, place, and time.  Skin: Skin is warm and dry.  Psychiatric: He has a normal mood and affect. His behavior is normal.  Nursing note and vitals reviewed.   ED Course  Procedures (including critical care time)  DIAGNOSTIC STUDIES: Oxygen Saturation is 96% on room air, normal by my interpretation.    COORDINATION OF CARE: 10:03 AM - Discussed treatment plan with pt at bedside which includes IV fluids, labs, and anti-nausea medication and pt agreed to plan.   Labs Review Labs Reviewed  COMPREHENSIVE METABOLIC PANEL - Abnormal; Notable for the following:    Glucose, Bld 104 (*)    All other components within normal limits  CBC WITH DIFFERENTIAL  TROPONIN I    Imaging Review No results found.   EKG Interpretation None      MDM   Final diagnoses:  Hematemesis with  nausea  LLQ abdominal pain  LLQ pain   Complex patient with hematemesis and left lower quadrant pain. Patient recently treated for diverticulitis. IV fluids, IV Zofran, IV Phenergan, IV Pepcid, IV Protonix, IV Cipro, IV Flagyl.  Stable vital signs. Admit to general medicine.  I personally performed the services described in this documentation, which was scribed in my presence. The recorded information has been reviewed and is accurate.        Nat Christen, MD 04/15/14 [place], MD 04/15/14 [phone]

## 2014-04-15 NOTE — Plan of Care (Signed)
Problem: Consults Goal: Diabetes Guidelines if Diabetic/Glucose > 140 If diabetic or lab glucose is > 140 mg/dl - Initiate Diabetes/Hyperglycemia Guidelines & Document Interventions  Outcome: Not Applicable Date Met:  04/15/14     

## 2014-04-15 NOTE — ED Notes (Signed)
Attempted to call report, 300 unsure where that pt is going, will call back to advise of what bed the pt will be going to. Be assignment had been made for more than 20 minutes when this nurse called to give report.

## 2014-04-16 ENCOUNTER — Encounter (HOSPITAL_COMMUNITY): Payer: Self-pay | Admitting: Gastroenterology

## 2014-04-16 ENCOUNTER — Encounter (HOSPITAL_COMMUNITY)
Admission: EM | Disposition: A | Discharge status: Home / Self Care | Payer: Self-pay | Source: Home / Self Care | Attending: Family Medicine

## 2014-04-16 DIAGNOSIS — K2990 Gastroduodenitis, unspecified, without bleeding: Secondary | ICD-10-CM

## 2014-04-16 DIAGNOSIS — R110 Nausea: Secondary | ICD-10-CM

## 2014-04-16 DIAGNOSIS — K2970 Gastritis, unspecified, without bleeding: Secondary | ICD-10-CM | POA: Insufficient documentation

## 2014-04-16 DIAGNOSIS — K920 Hematemesis: Secondary | ICD-10-CM

## 2014-04-16 HISTORY — PX: ESOPHAGOGASTRODUODENOSCOPY: SHX5428

## 2014-04-16 LAB — BASIC METABOLIC PANEL
Anion gap: 13 (ref 5–15)
BUN: 11 mg/dL (ref 6–23)
CO2: 25 mEq/L (ref 19–32)
Calcium: 8.9 mg/dL (ref 8.4–10.5)
Chloride: 106 mEq/L (ref 96–112)
Creatinine, Ser: 1.06 mg/dL (ref 0.50–1.35)
GFR calc Af Amer: >90 mL/min (ref >90)
GFR calc non Af Amer: 82 mL/min — ABNORMAL LOW (ref >90)
Glucose, Bld: 92 mg/dL (ref 70–99)
Potassium: 3.8 mEq/L (ref 3.7–5.3)
Sodium: 144 mEq/L (ref 137–147)

## 2014-04-16 LAB — CBC
HCT: 38.9 % — ABNORMAL LOW (ref 39.0–52.0)
Hemoglobin: 13.7 g/dL (ref 13.0–17.0)
MCH: 32.4 pg (ref 26.0–34.0)
MCHC: 35.2 g/dL (ref 30.0–36.0)
MCV: 92 fL (ref 78.0–100.0)
Platelets: 182 10*3/uL (ref 150–400)
RBC: 4.23 MIL/uL (ref 4.22–5.81)
RDW: 12.8 % (ref 11.5–15.5)
WBC: 7.4 10*3/uL (ref 4.0–10.5)

## 2014-04-16 LAB — TSH: TSH: 0.074 u[IU]/mL — ABNORMAL LOW (ref 0.350–4.500)

## 2014-04-16 SURGERY — EGD (ESOPHAGOGASTRODUODENOSCOPY)
Anesthesia: Moderate Sedation

## 2014-04-16 MED ORDER — SODIUM CHLORIDE 0.9 % IJ SOLN
INTRAMUSCULAR | Status: AC
  Filled 2014-04-16: qty 10

## 2014-04-16 MED ORDER — MIDAZOLAM HCL 5 MG/5ML IJ SOLN
INTRAMUSCULAR | Status: AC
  Filled 2014-04-16: qty 10

## 2014-04-16 MED ORDER — MEPERIDINE HCL 100 MG/ML IJ SOLN
INTRAMUSCULAR | Status: DC | PRN
  Administered 2014-04-16: 25 mg via INTRAVENOUS

## 2014-04-16 MED ORDER — PANTOPRAZOLE SODIUM 40 MG PO TBEC
40.0000 mg | DELAYED_RELEASE_TABLET | Freq: Two times a day (BID) | ORAL | Status: DC
  Administered 2014-04-16 – 2014-04-17 (×2): 40 mg via ORAL
  Filled 2014-04-16 (×2): qty 1

## 2014-04-16 MED ORDER — LEVOTHYROXINE SODIUM 150 MCG PO TABS
150.0000 ug | ORAL_TABLET | Freq: Every day | ORAL | Status: DC
  Administered 2014-04-17: 150 ug via ORAL
  Filled 2014-04-16: qty 2
  Filled 2014-04-16 (×4): qty 1

## 2014-04-16 MED ORDER — ONDANSETRON HCL 4 MG/2ML IJ SOLN
INTRAMUSCULAR | Status: AC
  Filled 2014-04-16: qty 2

## 2014-04-16 MED ORDER — ONDANSETRON HCL 4 MG/2ML IJ SOLN
INTRAMUSCULAR | Status: DC | PRN
  Administered 2014-04-16: 4 mg via INTRAVENOUS

## 2014-04-16 MED ORDER — LEVOTHYROXINE SODIUM 75 MCG PO TABS: 275.0000 ug | ORAL_TABLET | Freq: Every day | ORAL | Status: DC

## 2014-04-16 MED ORDER — PANTOPRAZOLE SODIUM 40 MG IV SOLR
40.0000 mg | INTRAVENOUS | Status: DC
  Administered 2014-04-16: 40 mg via INTRAVENOUS
  Filled 2014-04-16: qty 40

## 2014-04-16 MED ORDER — LEVOTHYROXINE SODIUM 125 MCG PO TABS
125.0000 ug | ORAL_TABLET | Freq: Every day | ORAL | Status: DC
  Administered 2014-04-17: 125 ug via ORAL
  Filled 2014-04-16 (×4): qty 1

## 2014-04-16 MED ORDER — SODIUM CHLORIDE 0.9 % IV SOLN
INTRAVENOUS | Status: DC
  Administered 2014-04-16: 13:00:00 via INTRAVENOUS

## 2014-04-16 MED ORDER — PROMETHAZINE HCL 25 MG/ML IJ SOLN
INTRAMUSCULAR | Status: AC
  Administered 2014-04-16: 25 mg
  Filled 2014-04-16: qty 1

## 2014-04-16 MED ORDER — MEPERIDINE HCL 100 MG/ML IJ SOLN
INTRAMUSCULAR | Status: AC
  Filled 2014-04-16: qty 2

## 2014-04-16 MED ORDER — MIDAZOLAM HCL 5 MG/5ML IJ SOLN
INTRAMUSCULAR | Status: DC | PRN
  Administered 2014-04-16: 2 mg via INTRAVENOUS

## 2014-04-16 MED ORDER — STERILE WATER FOR IRRIGATION IR SOLN
Status: DC | PRN
  Administered 2014-04-16: 13:00:00

## 2014-04-16 MED ORDER — PROMETHAZINE HCL 25 MG/ML IJ SOLN
25.0000 mg | Freq: Once | INTRAMUSCULAR | Status: DC
Start: 2014-04-16 — End: 2014-04-17

## 2014-04-16 MED ORDER — LIDOCAINE VISCOUS 2 % MT SOLN
OROMUCOSAL | Status: DC | PRN
  Administered 2014-04-16: 3 mL via OROMUCOSAL

## 2014-04-16 MED ORDER — LIDOCAINE VISCOUS 2 % MT SOLN
OROMUCOSAL | Status: AC
  Filled 2014-04-16: qty 15

## 2014-04-16 NOTE — Progress Notes (Signed)
UR chart review completed.  

## 2014-04-16 NOTE — Progress Notes (Signed)
[facility] PROGRESS NOTE  Andrew Love IRC:[id number] DOB: [birth year] DOA: 04/15/2014 PCP: [name], MD  Assessment/Plan: Principal Problem:  Hematemesis: no further episodes. Concern for mallory wies tear or PUD.  No history of NSAID use no aspirin. Continue with sensation of throat fullness.. No report of bright red blood per rectum or melena. Hemoglobin stable. Await FOBT. Evaluated by GI who recommend EGD and PPI. Active Problems: nausea and vomiting. Reports "under control" this am. No further emesis. Etiology remains unclear.  Will schedule Zofran. Has not needed Phenergan for breakthrough. EGD as above.  Denies dizziness.   Abdominal pain, left lower quadrant: some improvement this am. Remains somewhat tender on exam.Acute abdominal xray neg for obstruction. LFT's within limits of normal. See #1.  Continue Cipro and Flagyl day #2. Continue  pain medicine monitor   Dizziness/vertigo: appears resolved this am per patient. He has a history of the same. Has been on valium in past. No nystagmus noted   CAD (coronary artery disease) no chest pain. 2006 cath with 30% blockage in mid segment or RCA per chart. 09/2012 normal stress test. Echo 2014 with EF 55%   Essential hypertension: controlled    HLD (hyperlipidemia): continue home med.   Thyroid disease: s/p thyroidectomy.  TSH 0.74 down from 18.88 3 weeks ago. Last hospitalization synthroid dose changed there for TSH rose. Home dose 359mcg daily. This was resumed this admission. Will decrease dose slightly. Follow up TSH in 4-6 weeks OP  Throat fullness: etiology unclear. may be result of persistent vomiting. Will manage n/v and reassess. See #1  Code Status: full Family Communication: wife at bedside Disposition Plan: home when ready   Consultants:  GI  Procedures:  EGD  Antibiotics:  cirpro 04/15/14>>>  Flagyl 04/15/14>>  HPI/Subjective: Reports less abdominal pain, reports n/v "under  control", denies dizziness.   Objective: Filed Vitals:   04/16/14 0410  BP: 124/74  Pulse: 78  Temp: 98.5 F (36.9 C)  Resp: 20    Intake/Output Summary (Last 24 hours) at 04/16/14 1226 Last data filed at 04/16/14 0800  Gross per 24 hour  Intake 2835.42 ml  Output   1000 ml  Net 1835.42 ml   Filed Weights   04/15/14 0846 04/15/14 1524  Weight: 100.245 kg (221 lb) 101.152 kg (223 lb)    Exam:   General:  Obese appears comfortable  Cardiovascular: RRR, no m/g/r, no LE edema  Respiratory: normal effort BS clear bilaterally no wheeze no crackles  Abdomen: obese, soft mild tenderness in LLQ, non-distended with +BS  Musculoskeletal: no clubbing or cyanosis  Data Reviewed: Basic Metabolic Panel:  Recent Labs Lab 04/15/14 0953 04/16/14 0527  NA 140 144  K 3.8 3.8  CL 102 106  CO2 25 25  GLUCOSE 104* 92  BUN 13 11  CREATININE 0.90 1.06  CALCIUM 9.8 8.9   Liver Function Tests:  Recent Labs Lab 04/15/14 0953  AST 25  ALT 41  ALKPHOS 82  BILITOT 0.8  PROT 7.7  ALBUMIN 4.4   No results for input(s): LIPASE, AMYLASE in the last 168 hours. No results for input(s): AMMONIA in the last 168 hours. CBC:  Recent Labs Lab 04/15/14 0953 04/15/14 1945 04/16/14 0527  WBC 8.9 5.9 7.4  NEUTROABS 5.4  --   --   HGB 15.8 13.7 13.7  HCT 44.5 38.5* 38.9*  MCV 92.1 91.7 92.0  PLT 225 176 182   Cardiac Enzymes:  Recent Labs Lab 04/15/14 0957  TROPONINI <0.30  BNP (last 3 results) No results for input(s): PROBNP in the last 8760 hours. CBG: No results for input(s): GLUCAP in the last 168 hours.  No results found for this or any previous visit (from the past 240 hour(s)).   Studies: Dg Abd Acute W/chest  04/15/2014   CLINICAL DATA:  48 year old male with left lower quadrant abdominal pain since 8 p.m. yesterday evening with concurrent dizziness, nausea and vomiting bile/blood since 2 a.m. this morning.  EXAM: ACUTE ABDOMEN SERIES (ABDOMEN 2 VIEW & CHEST  1 VIEW)  COMPARISON:  Abdominal radiograph 11/24/2013.  FINDINGS: Lung volumes are low. No consolidative airspace disease. No pleural effusions. No pneumothorax. No pulmonary nodule or mass noted. Pulmonary vasculature and the cardiomediastinal silhouette are within normal limits.  Gas and stool are seen scattered throughout the colon extending to the level of the distal rectum. No pathologic distension of small bowel is noted. No gross evidence of pneumoperitoneum. Status post right total hip arthroplasty.  IMPRESSION: 1.  Nonobstructive bowel gas pattern. 2. No pneumoperitoneum. 3. Low lung volumes without radiographic evidence of acute cardiopulmonary disease.   Electronically Signed   By: Vinnie Langton M.D.   On: 04/15/2014 14:21    Scheduled Meds: . ciprofloxacin  400 mg Intravenous Q12H  . irbesartan  75 mg Oral Daily  . levothyroxine  300 mcg Oral QAC breakfast  . metronidazole  500 mg Intravenous Q8H  . ondansetron (ZOFRAN) IV  4 mg Intravenous Q4H  . pantoprazole (PROTONIX) IV  40 mg Intravenous Q24H  . promethazine  25 mg Intravenous Once  . zolpidem  10 mg Oral QHS   Continuous Infusions: . sodium chloride 1,000 mL (04/16/14 1051)    Principal Problem:   Hematemesis Active Problems:   Abdominal pain, left lower quadrant   Dizziness   CAD (coronary artery disease)   Essential hypertension   HLD (hyperlipidemia)   Nausea and/or vomiting   Throat fullness   Hematemesis with nausea   LLQ pain   Diverticulitis of colon    Time spent: 35 minutes    [facility] Pager [phone]. If 7PM-7AM, please contact night-coverage at [email or web address], password [facility] 04/16/2014, 12:26 PM  LOS: 1 day

## 2014-04-16 NOTE — Care Management Note (Addendum)
    Page 1 of 1   04/17/2014     12:05:15 PM CARE MANAGEMENT NOTE 04/17/2014  Patient:  Andrew Love, Andrew Love   Account Number:  [id number]  Date Initiated:  04/16/2014  Documentation initiated by:  Theophilus Kinds  Subjective/Objective Assessment:   Pt admitted with hematemesis and possible persistent diverticulitis. Pt lives with his wife and will return home at discharge. Pt is independent with ADL's.     Action/Plan:   No CM needs noted.   Anticipated DC Date:  04/20/2014   Anticipated DC Plan:  [place]  CM consult      Choice offered to / List presented to:             Status of service:  Completed, signed off Medicare Important Message given?   (If response is "NO", the following Medicare IM given date fields will be blank) Date Medicare IM given:   Medicare IM given by:   Date Additional Medicare IM given:   Additional Medicare IM given by:    Discharge Disposition:  HOME/SELF CARE  Per UR Regulation:    If discussed at Long Length of Stay Meetings, dates discussed:    Comments:  04/17/14 [place], RN BSN CM Pt discharged home today. No CM needs noted.  04/16/14 [place], RN BSN CM

## 2014-04-16 NOTE — Op Note (Signed)
[facility] [address] [place], [address]   ENDOSCOPY PROCEDURE REPORT  PATIENT: Andrew Love, Andrew Love  MR#: [id number] BIRTHDATE: [birth year] , 33  yrs. old GENDER: male ENDOSCOPIST: R.  Garfield Cornea, MD FACP FACG REFERRED BY:  Kerin Perna, M.D. PROCEDURE DATE:  04/17/14 PROCEDURE:  EGD w/ biopsy INDICATIONS:  nausea vomiting/hematemesis. MEDICATIONS: Versed 3 mg IV and Demerol 25 mg IV in divided doses. Phenergan 25 mg IV and Zofran 4 mg IV.  Xylocaine gel orally ASA CLASS:      Class II  CONSENT: The risks, benefits, limitations, alternatives and imponderables have been discussed.  The potential for biopsy, esophogeal dilation, etc. have also been reviewed.  Questions have been answered.  All parties agreeable.  Please see the history and physical in the medical record for more information.  DESCRIPTION OF PROCEDURE: After the risks benefits and alternatives of the procedure were thoroughly explained, informed consent was obtained.  The EG-[id number] ([id number]) endoscope was introduced through the mouth and advanced to the second portion of the duodenum , limited by Without limitations. The instrument was slowly withdrawn as the mucosa was fully examined.    Noncritical Schatzki's ring present.  Single 5 mm inverted "V" erosion straddling the GE junction.  No tumor seen. No Barrett's esophagus.  Tubular esophagus widely patent throughout its course. Stomach empty.  Small hiatal hernia.  Diffuse "mottling" of the gastric because.  No ulcer infiltrating process.  Patent pylorus. Normal first and second portion of the duodenum  Please note the patient had an exuberant gag reflex and heaved throughout the procedure.  No blood in the upper GI tract.  The abnormal appearing gastric mucosa was biopsied for histologic study.  Retroflexed views revealed as previously described.     The scope was then withdrawn from the patient and the procedure  completed.  COMPLICATIONS: There were no immediate complications.  ENDOSCOPIC IMPRESSION: Erosive reflux esophagitis. Noncritical Schatzki's ring?"not manipulated. Small hiatal hernia. Abnormal gastric mucosa of doubtful significance?"status post biopsy.    I suspect trivial upper GI bleed. Recent abdominal pain presumably secondary to a protracted bout of diverticulitis. CT scan November 23 revealed improvement without complication. He finished his antibiotics 2 days ago. Left sided abdominal pain has continued to improve.  RECOMMENDATIONS: Clear liquid diet. Twice a day PPI therapy. Reassess tomorrow morning. Hold off on repeating cross-sectional imaging for the time being but plans could change depending on clinical course.  REPEAT EXAM:  eSigned:  R. Garfield Cornea, MD Rosalita Chessman [facility] 04-17-2014 1:56 PM    CC:  CPT CODES: ICD CODES:  The ICD and CPT codes recommended by this software are interpretations from the data that the clinical staff has captured with the software.  The verification of the translation of this report to the ICD and CPT codes and modifiers is the sole responsibility of the health care institution and practicing physician where this report was generated.  [employer] will not be held responsible for the validity of the ICD and CPT codes included on this report.  AMA assumes no liability for data contained or not contained herein. CPT is a Designer, television/film set of the [employer].  PATIENT NAME:  Andrew Love, Andrew Love MR#: [id number]

## 2014-04-16 NOTE — Consult Note (Signed)
Referring Provider: Samuella Cota, MD Primary Care Physician:  [name], MD Primary Gastroenterologist:  Barney Drain, MD  Reason for Consultation:  hematemesis  HPI: Andrew Love is a 48 y.o. male admitted acute onset hematemesis which developed after eating barbeque at a local restaurant yesterday evening. Patient reports 3 hours after eating, he felt like he was having reflux. Developed lump in the throat. He took some Rolaids or TUMS without relief. Over the course of several hours he became nauseated and developed vomiting. First episode he had brbpr (couple of mouth fulls). This happened couple of times. Emesis then turned to bilious. PCP referred to ER early yesterday morning.   Patient also with h/o LLQ pain for past 1-2 months. Seen in ER 03/31/2014. CT showed trace inflammation about the distal descending colon ?early colitis/diverticulitis. Patient was having diarrhea at that time. He was given 10 day course of Cipro/Flagyl which he completed. He has small bilateral fat containing inguinal hernia. Cecum terminates in the LUQ, appendix appeared normal.   Patient reports his LLQ has not resolved. He has had frequent nausea for weeks. Dysphagia once prior to current illness. Typically does not have GERD. No prior EGD. No BM in 3 days. No melena, brbpr. Percocet PRN back pain but no necessary all the time. Denies NSAIDs. Takes daily ASA. Recently ran out of synthroid, off medication for 10 days. C/o 20 pound weight loss.     Prior to Admission medications   Medication Sig Start Date End Date Taking? Authorizing Provider  aspirin 325 MG tablet Take 1 tablet (325 mg total) by mouth daily. 03/21/14  Yes Domenic Polite, MD  levothyroxine (SYNTHROID, LEVOTHROID) 300 MCG tablet Take 300 mcg by mouth daily. 03/26/14  Yes Historical Provider, MD  olmesartan (BENICAR) 20 MG tablet Take 20 mg by mouth every evening.    Yes Historical Provider, MD  zolpidem (AMBIEN) 10 MG tablet Take  10 mg by mouth every morning.    Yes Historical Provider, MD          diazepam (VALIUM) 5 MG tablet Take 1 tablet (5 mg total) by mouth every 12 (twelve) hours as needed for muscle spasms. Patient not taking: Reported on 04/15/2014 07/30/13   Merryl Hacker, MD  EPINEPHrine (EPIPEN) 0.3 mg/0.3 mL SOAJ Inject 0.3 mLs (0.3 mg total) into the muscle as needed. Patient taking differently: Inject 0.3 mg into the muscle daily as needed (allergic reaction).  12/01/12   Teressa Lower, MD                 methocarbamol (ROBAXIN) 500 MG tablet Take 2 tablets (1,000 mg total) by mouth 3 (three) times daily. Prn muscle spasms Patient not taking: Reported on 04/15/2014 10/13/13   Tammy L. Triplett, PA-C          ondansetron (ZOFRAN ODT) 8 MG disintegrating tablet 8mg  ODT q4 hours prn nausea Patient not taking: Reported on 04/15/2014 03/31/14   Veryl Speak, MD   Percocet prn back pain.                Current Facility-Administered Medications  Medication Dose Route Frequency Provider Last Rate Last Dose  . 0.9 %  sodium chloride infusion  1,000 mL Intravenous Continuous Nat Christen, MD 125 mL/hr at 04/15/14 2327 1,000 mL at 04/15/14 2327  . acetaminophen (TYLENOL) tablet 650 mg  650 mg Oral Q6H PRN Radene Gunning, NP       Or  . acetaminophen (TYLENOL) suppository 650 mg  650 mg Rectal Q6H PRN Radene Gunning, NP      . alum & mag hydroxide-simeth (MAALOX/MYLANTA) 200-200-20 MG/5ML suspension 30 mL  30 mL Oral Q6H PRN Radene Gunning, NP      . ciprofloxacin (CIPRO) IVPB 400 mg  400 mg Intravenous Q12H Lezlie Octave Black, NP   400 mg at 04/16/14 0119  . HYDROcodone-acetaminophen (NORCO/VICODIN) 5-325 MG per tablet 1-2 tablet  1-2 tablet Oral Q4H PRN Radene Gunning, NP      . HYDROmorphone (DILAUDID) injection 0.5 mg  0.5 mg Intravenous Q3H PRN Radene Gunning, NP   0.5 mg at 04/16/14 0747  . irbesartan (AVAPRO) tablet 75 mg  75 mg Oral Daily Radene Gunning, NP   75 mg at 04/16/14 0805  . levothyroxine (SYNTHROID,  LEVOTHROID) tablet 300 mcg  300 mcg Oral QAC breakfast Radene Gunning, NP   300 mcg at 04/16/14 9292  . metroNIDAZOLE (FLAGYL) IVPB 500 mg  500 mg Intravenous Q8H Lezlie Octave Black, NP   500 mg at 04/16/14 0316  . ondansetron (ZOFRAN) injection 4 mg  4 mg Intravenous Q4H Radene Gunning, NP   4 mg at 04/16/14 0805  . promethazine (PHENERGAN) tablet 12.5 mg  12.5 mg Oral Q8H PRN Radene Gunning, NP      . zolpidem [facility]) tablet 10 mg  10 mg Oral QHS Radene Gunning, NP   10 mg at 04/15/14 2118    Allergies as of 04/15/2014 - Review Complete 04/15/2014  Allergen Reaction Noted  . Other Hives, Shortness Of Breath, and Itching 01/08/2013  . Iohexol Hives 03/31/2014    Past Medical History  Diagnosis Date  . CAD (coronary artery disease)   . Elevated liver enzymes   . Diverticulitis     Hx of; requiring 3 admissions  . Sigmoid colon ulcer     Rectal polyps  . Thyroid disease   . Psoriasis     Per medical history form dated 06/13/10.  . Chronic insomnia     Per medical history form dated 06/13/10.  . Colitis     Per medical history from dated 06/13/10.  Marland Kitchen Hypertension   . Hypothyroidism   . Bronchitis     history of  . Back pain     chronic  . Arthritis   . 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 Sleep apnea with use of continuous positive airway pressure (CPAP)     Past Surgical History  Procedure Laterality Date  . Colonoscopy  07/2008    Colitis,NSAID v. Ischemia,malrotation of the gut,Diverticulosis(L),hyperplastic  . Appendectomy    . Shoulder surgery      Left  . Rotator cuff repair      Left - per medical history form dated 06/13/10.  . Thyroidectomy      Per medical history form dated 06/13/10.  . Laparoscopic sigmoid colectomy  2012    Dr. Fanny Skates: recurrent sigmoid diverticulitis  . Total hip arthroplasty  07/04/2011    Procedure: TOTAL HIP ARTHROPLASTY;  Surgeon: Johnny Bridge, MD;  Location: [facility];  Service: Orthopedics;  Laterality: Right;  .  Joint replacement    . Cardiac catheterization  2009  . Cardiac catheterization      Per medical history from dated 06/13/10.  . Colonoscopy N/A 12/13/2012    Dr. Oneida Alar: Normal TI, mild sigmoid diverticulosis, hemorrhoids, 2 polyps (tubular adenoma). Random colon bx negative. Next TCS 12/2022 with Fentanyl/phenergan    Family History  Problem Relation Age of Onset  . Cancer Mother     breast cancer - per medical history form dated 06/13/10.  . Diverticulitis Mother   . Hypertension Mother   . Cancer Father     skin - per medical history form dated 06/13/10.  Marland Kitchen Hypertension Father   . Anesthesia problems Neg Hx   . Hypotension Neg Hx   . Malignant hyperthermia Neg Hx   . Pseudochol deficiency Neg Hx   . Colon cancer Neg Hx     History   Social History  . Marital Status: Married    Spouse Name: N/A    Number of Children: N/A  . Years of Education: N/A   Occupational History  . [employer] Un[employer]   Social History Main Topics  . Smoking status: Former Smoker -- 0.25 packs/day for 18 years    Quit date: 08/15/2012  . Smokeless tobacco: Never Used     Comment:    . Alcohol Use: 0.0 oz/week    0 Not specified per week     Comment: Occasional  . Drug Use: No  . Sexual Activity: Not on file   Other Topics Concern  . 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   Married     ROS:  General: Negative for anorexia,  fever, chills, fatigue, weakness. See hpi Eyes: Negative for vision changes.  ENT: Negative for hoarseness, difficulty swallowing , nasal congestion. CV: Negative for chest pain, angina, palpitations, dyspnea on exertion, peripheral edema.  Respiratory: Negative for dyspnea at rest, dyspnea on exertion, cough, sputum, wheezing.  GI: See history of present illness. GU:  Negative for dysuria, hematuria, urinary incontinence, urinary frequency, nocturnal urination.  MS: Negative for joint pain, low back pain.  Derm:  Negative for rash or itching.  Neuro: Negative for weakness, abnormal sensation, seizure, frequent headaches, memory loss, confusion.  Psych: Negative for anxiety, depression, suicidal ideation, hallucinations.  Endo:see hpi Heme: Negative for bruising or bleeding. Allergy: Negative for rash or hives.       Physical Examination: Vital signs in last 24 hours: Temp:  [98 F (36.7 C)-98.7 F (37.1 C)] 98.5 F (36.9 C) (12/10 0410) Pulse Rate:  [60-78] 78 (12/10 0410) Resp:  [12-24] 20 (12/10 0410) BP: (110-142)/(54-85) 124/74 mmHg (12/10 0410) SpO2:  [92 %-100 %] 92 % (12/10 0410) Weight:  [221 lb (100.245 kg)-223 lb (101.152 kg)] 223 lb (101.152 kg) (12/09 1524) Last BM Date: 04/13/14  General: Well-nourished, well-developed in no acute distress.  Head: Normocephalic, atraumatic.   Eyes: Conjunctiva pink, no icterus. Mouth: Oropharyngeal mucosa moist and pink , no lesions erythema or exudate. Neck: Supple without thyromegaly, masses, or lymphadenopathy.  Lungs: Clear to auscultation bilaterally.  Heart: Regular rate and rhythm, no murmurs rubs or gallops.  Abdomen: Bowel sounds are normal, moderate LLQ/left mid abd tenderness, nondistended, no hepatosplenomegaly or masses, no abdominal bruits or    hernia , no rebound or guarding.   Rectal: not performed Extremities: No lower extremity edema, clubbing, deformity.  Neuro: Alert and oriented x 4 , grossly normal neurologically.  Skin: Warm and dry, no rash or jaundice.   Psych: Alert and cooperative, normal mood and affect.        Intake/Output from previous day: 12/09 0701 - 12/10 0700 In: 2835.4 [I.V.:2435.4; IV Piggyback:400] Out: 1000 [Urine:1000] Intake/Output this shift:    Lab Results: CBC  Recent Labs  04/15/14 0953 04/15/14 1945 04/16/14 0527  WBC  8.9 5.9 7.4  HGB 15.8 13.7 13.7  HCT 44.5 38.5* 38.9*  MCV 92.1 91.7 92.0  PLT 225 176 182   BMET  Recent Labs  04/15/14 0953 04/16/14 0527  NA 140 144  K  3.8 3.8  CL 102 106  CO2 25 25  GLUCOSE 104* 92  BUN 13 11  CREATININE 0.90 1.06  CALCIUM 9.8 8.9   LFT  Recent Labs  04/15/14 0953  BILITOT 0.8  ALKPHOS 82  AST 25  ALT 41  PROT 7.7  ALBUMIN 4.4   Lab Results  Component Value Date   TSH 0.074* 04/15/2014   Lab Results  Component Value Date   HLKTGYBW38 937 03-30-2014   Lab Results  Component Value Date   LIPASE 25 03/30/2014    Lipase No results for input(s): LIPASE in the last 72 hours.  PT/INR No results for input(s): LABPROT, INR in the last 72 hours.    Imaging Studies:    Ct Head Wo Contrast  03/30/14   CLINICAL DATA:  Left-sided facial droop, dizziness, nausea, vomiting  EXAM: CT HEAD WITHOUT CONTRAST  TECHNIQUE: Contiguous axial images were obtained from the base of the skull through the vertex without intravenous contrast.  COMPARISON:  05/28/2012  FINDINGS: There is no evidence of mass effect, midline shift or extra-axial fluid collections. There is no evidence of a space-occupying lesion or intracranial hemorrhage. There is no evidence of a cortical-based area of acute infarction.  The ventricles and sulci are appropriate for the patient's age. The basal cisterns are patent.  Visualized portions of the orbits are unremarkable. The visualized portions of the paranasal sinuses and mastoid air cells are unremarkable.  The osseous structures are unremarkable.  IMPRESSION: No acute intracranial pathology.  These results were called by telephone at the time of interpretation on 30-Mar-2014 at 4:03 am to Dr. Julianne Rice , who verbally acknowledged these results.   Electronically Signed   By: Kathreen Devoid   On: 03/30/14 04:04   Mr Jodene Nam Head Wo Contrast  Mar 30, 2014   CLINICAL DATA:  Acute onset of dizziness and vertigo with left facial numbness. Also left lower extremity numbness.  EXAM: MRI HEAD WITHOUT CONTRAST  MRA HEAD WITHOUT CONTRAST  TECHNIQUE: Multiplanar, multiecho pulse sequences of the brain and  surrounding structures were obtained without intravenous contrast. Angiographic images of the head were obtained using MRA technique without contrast.  COMPARISON:  Head CT same day  FINDINGS: MRI HEAD FINDINGS  The brain has a normal appearance on all pulse sequences without evidence of malformation, atrophy, old or acute infarction, mass lesion, hemorrhage, hydrocephalus or extra-axial collection. No pituitary mass. No fluid in the sinuses, middle ears or mastoids. No skull or skullbase lesion. There is flow in the major vessels at the base of the brain. Major venous sinuses show flow.  MRA HEAD FINDINGS  Both internal carotid arteries are widely patent into the brain. No siphon stenosis. The anterior and middle cerebral vessels are normal without proximal stenosis, aneurysm or vascular malformation.  Both vertebral arteries are widely patent to the basilar. No basilar stenosis. Posterior circulation branch vessels are normal.  IMPRESSION: Normal MRI of the brain and normal intracranial MR angiography. No cause of the presenting symptoms is identified.   Electronically Signed   By: Nelson Chimes M.D.   On: 2014/03/30 12:47    Ct Abdomen Pelvis W Contrast  03/31/2014   CLINICAL DATA:  LEFT lower quadrant pain for 1 month. Nausea and vomiting began  4 days ago. History of appendectomy and colitis.  EXAM: CT ABDOMEN AND PELVIS WITH CONTRAST  TECHNIQUE: Multidetector CT imaging of the abdomen and pelvis was performed using the standard protocol following bolus administration of intravenous contrast.  CONTRAST:  80mL OMNIPAQUE IOHEXOL 300 MG/ML SOLN, 140mL OMNIPAQUE IOHEXOL 300 MG/ML SOLN  COMPARISON:  MRI of the lumbar spine December 01, 2013 and CT of the abdomen and pelvis November 15, 2012  FINDINGS: LUNG BASES: Included view of the lung bases demonstrate RIGHT lower lobe atelectasis. Visualized heart and pericardium are unremarkable.  SOLID ORGANS: The liver, spleen, gallbladder, pancreas and adrenal glands are  unremarkable.  GASTROINTESTINAL TRACT: The stomach, small and large bowel are normal in caliber ; mild diverticulosis. Cecum terminates in the LEFT upper quadrant, unchanged. Trace inflammation about the distal descending colon, improved from prior imaging. Normal appendix.  KIDNEYS/ URINARY TRACT: Kidneys are orthotopic, demonstrating symmetric enhancement. No nephrolithiasis, hydronephrosis or solid renal masses. The unopacified ureters are normal in course and caliber. Delayed imaging through the kidneys demonstrates symmetric prompt contrast excretion within the proximal urinary collecting system. Urinary bladder is partially distended and unremarkable.  PERITONEUM/RETROPERITONEUM: No intraperitoneal free fluid nor free air. Aortoiliac vessels are normal in course and caliber. No lymphadenopathy by CT size criteria. Seminal vesicle calcifications which can be seen with diabetes. Prostate is not enlarged.  SOFT TISSUE/OSSEOUS STRUCTURES: Nonsuspicious. RIGHT hip arthroplasty results in streak artifact limiting assessment of the pelvis. Small bilateral fat containing inguinal hernias. Anterior abdominal wall scarring.  IMPRESSION: Trace inflammation about the distal descending colon could reflect early acute colitis/diverticulitis.   Electronically Signed   By: Elon Alas   On: 03/31/2014 01:40   Dg Abd Acute W/chest  04/15/2014   CLINICAL DATA:  48 year old male with left lower quadrant abdominal pain since 8 p.m. yesterday evening with concurrent dizziness, nausea and vomiting bile/blood since 2 a.m. this morning.  EXAM: ACUTE ABDOMEN SERIES (ABDOMEN 2 VIEW & CHEST 1 VIEW)  COMPARISON:  Abdominal radiograph 11/24/2013.  FINDINGS: Lung volumes are low. No consolidative airspace disease. No pleural effusions. No pneumothorax. No pulmonary nodule or mass noted. Pulmonary vasculature and the cardiomediastinal silhouette are within normal limits.  Gas and stool are seen scattered throughout the colon  extending to the level of the distal rectum. No pathologic distension of small bowel is noted. No gross evidence of pneumoperitoneum. Status post right total hip arthroplasty.  IMPRESSION: 1.  Nonobstructive bowel gas pattern. 2. No pneumoperitoneum. 3. Low lung volumes without radiographic evidence of acute cardiopulmonary disease.   Electronically Signed   By: Vinnie Langton M.D.   On: 04/15/2014 14:21  [4 week]   Impression: 48 y/o male with complicated GI history consisting of recurrent diverticulitis requiring sigmoid colectomy in 2012. He has had two episodes of segmental colitis vs diverticulitis by CT since his surgery. Colonoscopy last year as outlined without evidence of IBD. Presents with ongoing LLQ pain which has been persistent for couple of months despite ABX therapy. He has known anatomy with cecum in the left abdomen, but appendix appeared normal on 03/2014 CT. Now with recurrent vomiting and some hematemesis. Suspect M-W tear vs PUD. Likely acute symptoms are unrelated to ongoing LLQ. Discussed with Dr. Gala Romney. Plan as outlined below.   Plan: 1. EGD today.  I have discussed the risks, alternatives, benefits with regards to but not limited to the risk of reaction to medication, bleeding, infection, perforation and the patient is agreeable to proceed. Written consent to be obtained. Augment  conscious sedation with phenergan. 2. Patient may require repeat CT imaging for persistent LLQ pain. Will decide after EGD findings.  3. Add PPI. 4. Management of TSH per attending.   We would like to thank you for the opportunity to participate in the care of Andrew Love.    LOS: 1 day   Neil Crouch  04/16/2014, 8:25 AM  Attending note:  Patient seen and examined in short stay. Agree with above assessment and recommendations as outlined. Patient needs an EGD today.  The risks, benefits, limitations, alternatives and imponderables have been reviewed with the patient. Potential for  esophageal dilation, biopsy, etc. have also been reviewed.  Questions have been answered. All parties agreeable.

## 2014-04-17 ENCOUNTER — Encounter (HOSPITAL_COMMUNITY): Payer: Self-pay | Admitting: Gastroenterology

## 2014-04-17 ENCOUNTER — Encounter: Payer: Self-pay | Admitting: Gastroenterology

## 2014-04-17 ENCOUNTER — Telehealth: Payer: Self-pay | Admitting: Gastroenterology

## 2014-04-17 MED ORDER — PANTOPRAZOLE SODIUM 40 MG PO TBEC: 40.0000 mg | DELAYED_RELEASE_TABLET | Freq: Two times a day (BID) | ORAL | Status: DC

## 2014-04-17 MED ORDER — ONDANSETRON 8 MG PO TBDP: ORAL_TABLET | ORAL | Status: DC

## 2014-04-17 MED ORDER — CIPROFLOXACIN HCL 500 MG PO TABS: 500.0000 mg | ORAL_TABLET | Freq: Two times a day (BID) | ORAL | Status: AC

## 2014-04-17 MED ORDER — METRONIDAZOLE 500 MG PO TABS: 500.0000 mg | ORAL_TABLET | Freq: Three times a day (TID) | ORAL | Status: AC

## 2014-04-17 MED ORDER — PROMETHAZINE HCL 12.5 MG PO TABS: 12.5000 mg | ORAL_TABLET | Freq: Three times a day (TID) | ORAL | Status: DC | PRN

## 2014-04-17 MED ORDER — PANTOPRAZOLE SODIUM 40 MG PO TBEC: DELAYED_RELEASE_TABLET | ORAL | Status: DC

## 2014-04-17 NOTE — Progress Notes (Signed)
Patient and wife received discharge instructions and scripts.  Had no further questions or concerns.  Patient in stable condition at discharge.  IV removed and was clean, dry, and intact at removal.  Patient escorted to vehicle via wheelchair by nurse.

## 2014-04-17 NOTE — Telephone Encounter (Signed)
Patient needs follow up ov in 6 weeks (hospital follow up).

## 2014-04-17 NOTE — Discharge Summary (Signed)
Physician Discharge Summary  Andrew Love KDX:[id number] DOB: [birth year] DOA: 04/15/2014  PCP: [name], MD  Admit date: 04/15/2014 Discharge date: 04/17/2014  Time spent: 40 minutes  Recommendations for Outpatient Follow-up:  1. Follow up with Dr Gala Romney 06/04/13 for evaluation of persistent nausea/vomiting, post EGD with esophagitis, follow biopsy 2. PCP in 4-6 weeks for evaluation of TSH and monitoring of synthroid dose.   Discharge Diagnoses:  Principal Problem:   Hematemesis Active Problems:   Abdominal pain, left lower quadrant   Dizziness   CAD (coronary artery disease)   Essential hypertension   HLD (hyperlipidemia)   Nausea and/or vomiting   Throat fullness   Hematemesis with nausea   LLQ pain   Diverticulitis of colon   Gastritis and gastroduodenitis   Discharge Condition: stable  Diet recommendation: full liquid to be advanced as tolerated  Filed Weights   04/15/14 0846 04/15/14 1524  Weight: 100.245 kg (221 lb) 101.152 kg (223 lb)    History of present illness:  Andrew Love is a 48 y.o. male with a past medical history that includes CAD, diverticulitis, thyroid disease status post thyroidectomy, hypertension, vertigo, colitis s/p resection in 2010, presented to the emergency department on 04/15/14 with the chief complaint of hematemesis and intractable nausea and vomiting. Initial evaluation in the emergency room concerning for upper GI bleed or more likely recurrence of colitis/diverticulitis. Patient reported that 2 weeks prior he was diagnosed with diverticulitis and completed a course of Cipro and Flagyl. During that time his nausea and abdominal pain gradually improved. One day prior to presentation he developed worsening nausea and estimates 15 episodes of emesis since 8:00 last night. He reported the first 2 episodes with bright red blood. After that emesis consisted of green bile substance. He had eaten 8 hours earlier some barbecue. Associated  symptoms included abdominal pain particularly in the left lower quadrant intermittent dizziness characteristics consistent with vertigo and a sense of fullness in his throat. He denied fever chills headache. Denied diarrhea constipation bright red blood per rectum or melena. He denied dysuria hematuria frequency or urgency. Workup in the emergency room included a complete metabolic panel was unremarkable. Presentation he was hemodynamically stable and not hypoxic. In the emergency department he received 1 L of normal saline, Pepcid IV, Ativan IV, Flagyl IV, Zofran, Protonix, and Phenergan. At the time of admission he continued to complain of nausea however it had improved. Acute abdominal x-ray  Pending.  Hospital Course:  Principal Problem:  Hematemesis: no further episodes during hospitalization. Concern for mallory wies tear or PUD. EGD revealed erosive reflux esophagitis as well as trivial upper GI bleed suspected. Hg remained stable.  GI recommend PPI BID for 30 days then daily. Follow up appointment as above. GI will follow up on biopsy.   Active Problems: nausea and vomiting. No further emesis this hospitalization and nausea improved on scheduled zofran. At discharge tolerating full liquids. Will discharge with zofran as needed.    Abdominal pain, left lower quadrant: much improved at discharge. Some concern recurrence diverticulitis. Abdominal series negative. LFT's within limits of normal. See #1. Evaluated by GI who opined no need to repeat CT at this time. Will discharge with Cipro and Flagyl to complete 10 day course.    Dizziness/vertigo: resolved at discharge.   CAD (coronary artery disease) no chest pain.    Essential hypertension: controlled    HLD (hyperlipidemia): continue home med.   Thyroid disease: s/p thyroidectomy. TSH 0.74 down from 18.88 3 weeks ago.  Continue home synthroid. Recommend OP follow up withPCP  Throat fullness: EGD with non-critical Schatzki's ring.  OP monitoring.   Procedures:  04/16/14 EGD Erosive reflux esophagitis. Noncritical Schatzki's ring?"not manipulated. Small hiatal hernia. Abnormal gastric mucosa of doubtful significance?"status post biopsy. I suspect trivial upper GI bleed. Recent abdominal pain presumably secondary to a protracted bout of diverticulitis. CT scan November 23 revealed improvement without complication. He finished his antibiotics 2 days ago. Left sided abdominal pain has continued to improve.    Consultations:  Dr Gala Romney GI  Discharge Exam: Filed Vitals:   04/17/14 1044  BP: 128/68  Pulse: 66  Temp:   Resp:     General: well nourished appears comfortable Cardiovascular: RRR No MGR No LE edema Respiratory: normal effort BS clear bilaterally Abdomen: obese soft +BS mild diffuse tenderness no guarding  Discharge Instructions You were cared for by a hospitalist during your hospital stay. If you have any questions about your discharge medications or the care you received while you were in the hospital after you are discharged, you can call the unit and asked to speak with the hospitalist on call if the hospitalist that took care of you is not available. Once you are discharged, your primary care physician will handle any further medical issues. Please note that NO REFILLS for any discharge medications will be authorized once you are discharged, as it is imperative that you return to your primary care physician (or establish a relationship with a primary care physician if you do not have one) for your aftercare needs so that they can reassess your need for medications and monitor your lab values.  Discharge Instructions    Diet - low sodium heart healthy    Complete by:  As directed      Discharge instructions    Complete by:  As directed   Take medication as directed Advance diet slowly as tolerated Follow up with PCP and Dr Gala Romney as scheduled.     Increase activity slowly    Complete by:   As directed           Current Discharge Medication List    START taking these medications   Details  pantoprazole (PROTONIX) 40 MG tablet Take 1 tablet by mouth 2 time daily before a meal for 30 days then take 1 tab daily before a meal Qty: 60 tablet, Refills: 0    promethazine (PHENERGAN) 12.5 MG tablet Take 1 tablet (12.5 mg total) by mouth every 8 (eight) hours as needed for nausea or refractory nausea / vomiting. Qty: 10 tablet, Refills: 0      CONTINUE these medications which have CHANGED   Details  ciprofloxacin (CIPRO) 500 MG tablet Take 1 tablet (500 mg total) by mouth 2 (two) times daily. One po bid x 8 days Qty: 16 tablet, Refills: 0    metroNIDAZOLE (FLAGYL) 500 MG tablet Take 1 tablet (500 mg total) by mouth 3 (three) times daily. One po bid x 8 days Qty: 24 tablet, Refills: 0    ondansetron (ZOFRAN ODT) 8 MG disintegrating tablet 8mg  ODT q4 hours prn nausea Qty: 6 tablet, Refills: 0      CONTINUE these medications which have NOT CHANGED   Details  aspirin 325 MG tablet Take 1 tablet (325 mg total) by mouth daily.    levothyroxine (SYNTHROID, LEVOTHROID) 300 MCG tablet Take 300 mcg by mouth daily.    olmesartan (BENICAR) 20 MG tablet Take 20 mg by mouth every evening.  zolpidem (AMBIEN) 10 MG tablet Take 10 mg by mouth every morning.     diazepam (VALIUM) 5 MG tablet Take 1 tablet (5 mg total) by mouth every 12 (twelve) hours as needed for muscle spasms. Qty: 10 tablet, Refills: 0    EPINEPHrine (EPIPEN) 0.3 mg/0.3 mL SOAJ Inject 0.3 mLs (0.3 mg total) into the muscle as needed. Qty: 1 Device, Refills: 3    methocarbamol (ROBAXIN) 500 MG tablet Take 2 tablets (1,000 mg total) by mouth 3 (three) times daily. Prn muscle spasms Qty: 42 tablet, Refills: 0    oxyCODONE-acetaminophen (PERCOCET/ROXICET) 5-325 MG per tablet Take 1 tablet by mouth every 4 (four) hours as needed for severe pain. Qty: 15 tablet, Refills: 0      STOP taking these medications      HYDROcodone-acetaminophen (NORCO) 5-325 MG per tablet        Allergies  Allergen Reactions  . Other Hives, Shortness Of Breath and Itching    Insect bite- bug name unknown=  . Iohexol Hives   Follow-up Information    Follow up with Manus Rudd, MD.   Specialty:  Gastroenterology   Contact information:   [address] [place] [address] [phone]       Follow up with [name], MD. Schedule an appointment as soon as possible for a visit in 4 weeks.   Specialty:  Internal Medicine   Why:  for repeat TSH for management of synthroid dose   Contact information:   [address] [place] [address] [phone]        The results of significant diagnostics from this hospitalization (including imaging, microbiology, ancillary and laboratory) are listed below for reference.    Significant Diagnostic Studies: Dg Chest 2 View  03/21/2014   CLINICAL DATA:  48 year old male with shortness of breath and chest pain.  EXAM: CHEST  2 VIEW  COMPARISON:  Prior chest radiographs dating back to 04/21/2000  FINDINGS: The cardiomediastinal silhouette is unremarkable.  There is no evidence of focal airspace disease, pulmonary edema, suspicious pulmonary nodule/mass, pleural effusion, or pneumothorax. No acute bony abnormalities are identified.  IMPRESSION: No active cardiopulmonary disease.   Electronically Signed   By: Hassan Rowan M.D.   On: 03/21/2014 14:13   Ct Head Wo Contrast  03/21/2014   CLINICAL DATA:  Left-sided facial droop, dizziness, nausea, vomiting  EXAM: CT HEAD WITHOUT CONTRAST  TECHNIQUE: Contiguous axial images were obtained from the base of the skull through the vertex without intravenous contrast.  COMPARISON:  05/28/2012  FINDINGS: There is no evidence of mass effect, midline shift or extra-axial fluid collections. There is no evidence of a space-occupying lesion or intracranial hemorrhage. There is no evidence of a cortical-based area of acute  infarction.  The ventricles and sulci are appropriate for the patient's age. The basal cisterns are patent.  Visualized portions of the orbits are unremarkable. The visualized portions of the paranasal sinuses and mastoid air cells are unremarkable.  The osseous structures are unremarkable.  IMPRESSION: No acute intracranial pathology.  These results were called by telephone at the time of interpretation on 03/21/2014 at 4:03 am to Dr. Julianne Rice , who verbally acknowledged these results.   Electronically Signed   By: Kathreen Devoid   On: 03/21/2014 04:04   Mr Jodene Nam Head Wo Contrast  03/21/2014   CLINICAL DATA:  Acute onset of dizziness and vertigo with left facial numbness. Also left lower extremity numbness.  EXAM: MRI HEAD WITHOUT CONTRAST  MRA HEAD WITHOUT  CONTRAST  TECHNIQUE: Multiplanar, multiecho pulse sequences of the brain and surrounding structures were obtained without intravenous contrast. Angiographic images of the head were obtained using MRA technique without contrast.  COMPARISON:  Head CT same day  FINDINGS: MRI HEAD FINDINGS  The brain has a normal appearance on all pulse sequences without evidence of malformation, atrophy, old or acute infarction, mass lesion, hemorrhage, hydrocephalus or extra-axial collection. No pituitary mass. No fluid in the sinuses, middle ears or mastoids. No skull or skullbase lesion. There is flow in the major vessels at the base of the brain. Major venous sinuses show flow.  MRA HEAD FINDINGS  Both internal carotid arteries are widely patent into the brain. No siphon stenosis. The anterior and middle cerebral vessels are normal without proximal stenosis, aneurysm or vascular malformation.  Both vertebral arteries are widely patent to the basilar. No basilar stenosis. Posterior circulation branch vessels are normal.  IMPRESSION: Normal MRI of the brain and normal intracranial MR angiography. No cause of the presenting symptoms is identified.   Electronically Signed    By: Nelson Chimes M.D.   On: 03/21/2014 12:47   Mr Brain Wo Contrast  03/21/2014   CLINICAL DATA:  Acute onset of dizziness and vertigo with left facial numbness. Also left lower extremity numbness.  EXAM: MRI HEAD WITHOUT CONTRAST  MRA HEAD WITHOUT CONTRAST  TECHNIQUE: Multiplanar, multiecho pulse sequences of the brain and surrounding structures were obtained without intravenous contrast. Angiographic images of the head were obtained using MRA technique without contrast.  COMPARISON:  Head CT same day  FINDINGS: MRI HEAD FINDINGS  The brain has a normal appearance on all pulse sequences without evidence of malformation, atrophy, old or acute infarction, mass lesion, hemorrhage, hydrocephalus or extra-axial collection. No pituitary mass. No fluid in the sinuses, middle ears or mastoids. No skull or skullbase lesion. There is flow in the major vessels at the base of the brain. Major venous sinuses show flow.  MRA HEAD FINDINGS  Both internal carotid arteries are widely patent into the brain. No siphon stenosis. The anterior and middle cerebral vessels are normal without proximal stenosis, aneurysm or vascular malformation.  Both vertebral arteries are widely patent to the basilar. No basilar stenosis. Posterior circulation branch vessels are normal.  IMPRESSION: Normal MRI of the brain and normal intracranial MR angiography. No cause of the presenting symptoms is identified.   Electronically Signed   By: Nelson Chimes M.D.   On: 03/21/2014 12:47   Ct Abdomen Pelvis W Contrast  03/31/2014   CLINICAL DATA:  LEFT lower quadrant pain for 1 month. Nausea and vomiting began 4 days ago. History of appendectomy and colitis.  EXAM: CT ABDOMEN AND PELVIS WITH CONTRAST  TECHNIQUE: Multidetector CT imaging of the abdomen and pelvis was performed using the standard protocol following bolus administration of intravenous contrast.  CONTRAST:  39mL OMNIPAQUE IOHEXOL 300 MG/ML SOLN, 185mL OMNIPAQUE IOHEXOL 300 MG/ML SOLN   COMPARISON:  MRI of the lumbar spine December 01, 2013 and CT of the abdomen and pelvis November 15, 2012  FINDINGS: LUNG BASES: Included view of the lung bases demonstrate RIGHT lower lobe atelectasis. Visualized heart and pericardium are unremarkable.  SOLID ORGANS: The liver, spleen, gallbladder, pancreas and adrenal glands are unremarkable.  GASTROINTESTINAL TRACT: The stomach, small and large bowel are normal in caliber ; mild diverticulosis. Cecum terminates in the LEFT upper quadrant, unchanged. Trace inflammation about the distal descending colon, improved from prior imaging. Normal appendix.  KIDNEYS/ URINARY TRACT: Kidneys are  orthotopic, demonstrating symmetric enhancement. No nephrolithiasis, hydronephrosis or solid renal masses. The unopacified ureters are normal in course and caliber. Delayed imaging through the kidneys demonstrates symmetric prompt contrast excretion within the proximal urinary collecting system. Urinary bladder is partially distended and unremarkable.  PERITONEUM/RETROPERITONEUM: No intraperitoneal free fluid nor free air. Aortoiliac vessels are normal in course and caliber. No lymphadenopathy by CT size criteria. Seminal vesicle calcifications which can be seen with diabetes. Prostate is not enlarged.  SOFT TISSUE/OSSEOUS STRUCTURES: Nonsuspicious. RIGHT hip arthroplasty results in streak artifact limiting assessment of the pelvis. Small bilateral fat containing inguinal hernias. Anterior abdominal wall scarring.  IMPRESSION: Trace inflammation about the distal descending colon could reflect early acute colitis/diverticulitis.   Electronically Signed   By: Elon Alas   On: 03/31/2014 01:40   Dg Abd Acute W/chest  04/15/2014   CLINICAL DATA:  48 year old male with left lower quadrant abdominal pain since 8 p.m. yesterday evening with concurrent dizziness, nausea and vomiting bile/blood since 2 a.m. this morning.  EXAM: ACUTE ABDOMEN SERIES (ABDOMEN 2 VIEW & CHEST 1 VIEW)   COMPARISON:  Abdominal radiograph 11/24/2013.  FINDINGS: Lung volumes are low. No consolidative airspace disease. No pleural effusions. No pneumothorax. No pulmonary nodule or mass noted. Pulmonary vasculature and the cardiomediastinal silhouette are within normal limits.  Gas and stool are seen scattered throughout the colon extending to the level of the distal rectum. No pathologic distension of small bowel is noted. No gross evidence of pneumoperitoneum. Status post right total hip arthroplasty.  IMPRESSION: 1.  Nonobstructive bowel gas pattern. 2. No pneumoperitoneum. 3. Low lung volumes without radiographic evidence of acute cardiopulmonary disease.   Electronically Signed   By: Vinnie Langton M.D.   On: 04/15/2014 14:21    Microbiology: No results found for this or any previous visit (from the past 240 hour(s)).   Labs: Basic Metabolic Panel:  Recent Labs Lab 04/15/14 0953 04/16/14 0527  NA 140 144  K 3.8 3.8  CL 102 106  CO2 25 25  GLUCOSE 104* 92  BUN 13 11  CREATININE 0.90 1.06  CALCIUM 9.8 8.9   Liver Function Tests:  Recent Labs Lab 04/15/14 0953  AST 25  ALT 41  ALKPHOS 82  BILITOT 0.8  PROT 7.7  ALBUMIN 4.4   No results for input(s): LIPASE, AMYLASE in the last 168 hours. No results for input(s): AMMONIA in the last 168 hours. CBC:  Recent Labs Lab 04/15/14 0953 04/15/14 1945 04/16/14 0527  WBC 8.9 5.9 7.4  NEUTROABS 5.4  --   --   HGB 15.8 13.7 13.7  HCT 44.5 38.5* 38.9*  MCV 92.1 91.7 92.0  PLT 225 176 182   Cardiac Enzymes:  Recent Labs Lab 04/15/14 0957  TROPONINI <0.30   BNP: BNP (last 3 results) No results for input(s): PROBNP in the last 8760 hours. CBG: No results for input(s): GLUCAP in the last 168 hours.     SignedRadene Gunning  [facility] 04/17/2014, 12:36 PM

## 2014-04-17 NOTE — Progress Notes (Signed)
Subjective:  Feels better. Wants to go home.   Objective: Vital signs in last 24 hours: Temp:  [97.9 F (36.6 C)-98.5 F (36.9 C)] 98.3 F (36.8 C) (12/11 0627) Pulse Rate:  [66-82] 82 (12/11 0627) Resp:  [16-28] 18 (12/11 0627) BP: (111-129)/(61-86) 129/70 mmHg (12/11 0627) SpO2:  [91 %-98 %] 91 % (12/11 0627) Last BM Date: 04/13/14 General:   Alert,  Well-developed, well-nourished, pleasant and cooperative in NAD Head:  Normocephalic and atraumatic. Eyes:  Sclera clear, no icterus.   Abdomen:  Soft  Neurologic:  Alert and  oriented x4;  grossly normal neurologically. Skin:  Intact without significant lesions or rashes. Psych:  Alert and cooperative. Normal mood and affect.  Intake/Output from previous day: 12/10 0701 - 12/11 0700 In: 240 [P.O.:240] Out: 3100 [Urine:3100] Intake/Output this shift:    Lab Results: CBC  Recent Labs  04/15/14 0953 04/15/14 1945 04/16/14 0527  WBC 8.9 5.9 7.4  HGB 15.8 13.7 13.7  HCT 44.5 38.5* 38.9*  MCV 92.1 91.7 92.0  PLT 225 176 182   BMET  Recent Labs  04/15/14 0953 04/16/14 0527  NA 140 144  K 3.8 3.8  CL 102 106  CO2 25 25  GLUCOSE 104* 92  BUN 13 11  CREATININE 0.90 1.06  CALCIUM 9.8 8.9   LFTs  Recent Labs  04/15/14 0953  BILITOT 0.8  ALKPHOS 82  AST 25  ALT 41  PROT 7.7  ALBUMIN 4.4   No results for input(s): LIPASE in the last 72 hours. PT/INR No results for input(s): LABPROT, INR in the last 72 hours.    Imaging Studies: Dg Chest 2 View  03/21/2014   CLINICAL DATA:  48 year old male with shortness of breath and chest pain.  EXAM: CHEST  2 VIEW  COMPARISON:  Prior chest radiographs dating back to 04/21/2000  FINDINGS: The cardiomediastinal silhouette is unremarkable.  There is no evidence of focal airspace disease, pulmonary edema, suspicious pulmonary nodule/mass, pleural effusion, or pneumothorax. No acute bony abnormalities are identified.  IMPRESSION: No active cardiopulmonary disease.    Electronically Signed   By: Hassan Rowan M.D.   On: 03/21/2014 14:13   Ct Head Wo Contrast  03/21/2014   CLINICAL DATA:  Left-sided facial droop, dizziness, nausea, vomiting  EXAM: CT HEAD WITHOUT CONTRAST  TECHNIQUE: Contiguous axial images were obtained from the base of the skull through the vertex without intravenous contrast.  COMPARISON:  05/28/2012  FINDINGS: There is no evidence of mass effect, midline shift or extra-axial fluid collections. There is no evidence of a space-occupying lesion or intracranial hemorrhage. There is no evidence of a cortical-based area of acute infarction.  The ventricles and sulci are appropriate for the patient's age. The basal cisterns are patent.  Visualized portions of the orbits are unremarkable. The visualized portions of the paranasal sinuses and mastoid air cells are unremarkable.  The osseous structures are unremarkable.  IMPRESSION: No acute intracranial pathology.  These results were called by telephone at the time of interpretation on 03/21/2014 at 4:03 am to Dr. Julianne Rice , who verbally acknowledged these results.   Electronically Signed   By: Kathreen Devoid   On: 03/21/2014 04:04   Mr Jodene Nam Head Wo Contrast  03/21/2014   CLINICAL DATA:  Acute onset of dizziness and vertigo with left facial numbness. Also left lower extremity numbness.  EXAM: MRI HEAD WITHOUT CONTRAST  MRA HEAD WITHOUT CONTRAST  TECHNIQUE: Multiplanar, multiecho pulse sequences of the brain and surrounding structures were obtained  without intravenous contrast. Angiographic images of the head were obtained using MRA technique without contrast.  COMPARISON:  Head CT same day  FINDINGS: MRI HEAD FINDINGS  The brain has a normal appearance on all pulse sequences without evidence of malformation, atrophy, old or acute infarction, mass lesion, hemorrhage, hydrocephalus or extra-axial collection. No pituitary mass. No fluid in the sinuses, middle ears or mastoids. No skull or skullbase lesion. There is  flow in the major vessels at the base of the brain. Major venous sinuses show flow.  MRA HEAD FINDINGS  Both internal carotid arteries are widely patent into the brain. No siphon stenosis. The anterior and middle cerebral vessels are normal without proximal stenosis, aneurysm or vascular malformation.  Both vertebral arteries are widely patent to the basilar. No basilar stenosis. Posterior circulation branch vessels are normal.  IMPRESSION: Normal MRI of the brain and normal intracranial MR angiography. No cause of the presenting symptoms is identified.   Electronically Signed   By: Nelson Chimes M.D.   On: 03/21/2014 12:47   Mr Brain Wo Contrast  03/21/2014   CLINICAL DATA:  Acute onset of dizziness and vertigo with left facial numbness. Also left lower extremity numbness.  EXAM: MRI HEAD WITHOUT CONTRAST  MRA HEAD WITHOUT CONTRAST  TECHNIQUE: Multiplanar, multiecho pulse sequences of the brain and surrounding structures were obtained without intravenous contrast. Angiographic images of the head were obtained using MRA technique without contrast.  COMPARISON:  Head CT same day  FINDINGS: MRI HEAD FINDINGS  The brain has a normal appearance on all pulse sequences without evidence of malformation, atrophy, old or acute infarction, mass lesion, hemorrhage, hydrocephalus or extra-axial collection. No pituitary mass. No fluid in the sinuses, middle ears or mastoids. No skull or skullbase lesion. There is flow in the major vessels at the base of the brain. Major venous sinuses show flow.  MRA HEAD FINDINGS  Both internal carotid arteries are widely patent into the brain. No siphon stenosis. The anterior and middle cerebral vessels are normal without proximal stenosis, aneurysm or vascular malformation.  Both vertebral arteries are widely patent to the basilar. No basilar stenosis. Posterior circulation branch vessels are normal.  IMPRESSION: Normal MRI of the brain and normal intracranial MR angiography. No cause of  the presenting symptoms is identified.   Electronically Signed   By: Nelson Chimes M.D.   On: 03/21/2014 12:47   Ct Abdomen Pelvis W Contrast  03/31/2014   CLINICAL DATA:  LEFT lower quadrant pain for 1 month. Nausea and vomiting began 4 days ago. History of appendectomy and colitis.  EXAM: CT ABDOMEN AND PELVIS WITH CONTRAST  TECHNIQUE: Multidetector CT imaging of the abdomen and pelvis was performed using the standard protocol following bolus administration of intravenous contrast.  CONTRAST:  26mL OMNIPAQUE IOHEXOL 300 MG/ML SOLN, 17mL OMNIPAQUE IOHEXOL 300 MG/ML SOLN  COMPARISON:  MRI of the lumbar spine December 01, 2013 and CT of the abdomen and pelvis November 15, 2012  FINDINGS: LUNG BASES: Included view of the lung bases demonstrate RIGHT lower lobe atelectasis. Visualized heart and pericardium are unremarkable.  SOLID ORGANS: The liver, spleen, gallbladder, pancreas and adrenal glands are unremarkable.  GASTROINTESTINAL TRACT: The stomach, small and large bowel are normal in caliber ; mild diverticulosis. Cecum terminates in the LEFT upper quadrant, unchanged. Trace inflammation about the distal descending colon, improved from prior imaging. Normal appendix.  KIDNEYS/ URINARY TRACT: Kidneys are orthotopic, demonstrating symmetric enhancement. No nephrolithiasis, hydronephrosis or solid renal masses. The unopacified ureters are  normal in course and caliber. Delayed imaging through the kidneys demonstrates symmetric prompt contrast excretion within the proximal urinary collecting system. Urinary bladder is partially distended and unremarkable.  PERITONEUM/RETROPERITONEUM: No intraperitoneal free fluid nor free air. Aortoiliac vessels are normal in course and caliber. No lymphadenopathy by CT size criteria. Seminal vesicle calcifications which can be seen with diabetes. Prostate is not enlarged.  SOFT TISSUE/OSSEOUS STRUCTURES: Nonsuspicious. RIGHT hip arthroplasty results in streak artifact limiting assessment  of the pelvis. Small bilateral fat containing inguinal hernias. Anterior abdominal wall scarring.  IMPRESSION: Trace inflammation about the distal descending colon could reflect early acute colitis/diverticulitis.   Electronically Signed   By: Elon Alas   On: 03/31/2014 01:40   Dg Abd Acute W/chest  04/15/2014   CLINICAL DATA:  48 year old male with left lower quadrant abdominal pain since 8 p.m. yesterday evening with concurrent dizziness, nausea and vomiting bile/blood since 2 a.m. this morning.  EXAM: ACUTE ABDOMEN SERIES (ABDOMEN 2 VIEW & CHEST 1 VIEW)  COMPARISON:  Abdominal radiograph 11/24/2013.  FINDINGS: Lung volumes are low. No consolidative airspace disease. No pleural effusions. No pneumothorax. No pulmonary nodule or mass noted. Pulmonary vasculature and the cardiomediastinal silhouette are within normal limits.  Gas and stool are seen scattered throughout the colon extending to the level of the distal rectum. No pathologic distension of small bowel is noted. No gross evidence of pneumoperitoneum. Status post right total hip arthroplasty.  IMPRESSION: 1.  Nonobstructive bowel gas pattern. 2. No pneumoperitoneum. 3. Low lung volumes without radiographic evidence of acute cardiopulmonary disease.   Electronically Signed   By: Vinnie Langton M.D.   On: 04/15/2014 14:21  [2 weeks]   Assessment: 48 y/o male with complicated GI history consisting of recurrent diverticulitis requiring sigmoid colectomy in 2012. He has had two episodes of segmental colitis vs diverticulitis by CT since his surgery. Colonoscopy last year as outlined without evidence of IBD. Presents with ongoing LLQ pain which has been persistent for couple of months despite ABX therapy. He has known anatomy with cecum in the left abdomen, but appendix appeared normal on 03/2014 CT. Now with recurrent vomiting and some hematemesis.  EGD yesterday showed erosive reflux esophagitis. Abnormal gastric mucosa of doubtful  significance, s/p biopsy. Likely trivial UGI bleed. Patient feels better today. LLQ pain improved. No further vomiting. Appetite slow to return.  Patient reports that he had appendix removed in the 8th grade. I will have CT reevaluated and addendum made with regards to "normal appendix".   Plan: 1. Continue pantoprazole BID for one month and then daily. 2. Hold off on CT for now since noted improvement. Patient will call us if continues to have LLQ pain. Consider CT at that time. 3. Patient to return to office in 4-6 weeks for follow up.  4. F/U on biopsy. 5. Patient desires discharge today, from a GI standpoint he could continue management at home.     LOS: 2 days   Neil Crouch  04/17/2014, 7:51 AM

## 2014-04-17 NOTE — Telephone Encounter (Signed)
APPT MADE AND LETTER SENT  °

## 2014-04-22 ENCOUNTER — Encounter: Payer: Self-pay | Admitting: Internal Medicine

## 2014-06-04 ENCOUNTER — Encounter: Payer: Self-pay | Admitting: Gastroenterology

## 2014-06-04 ENCOUNTER — Ambulatory Visit (INDEPENDENT_AMBULATORY_CARE_PROVIDER_SITE_OTHER): Payer: 59 | Admitting: Gastroenterology

## 2014-06-04 VITALS — BP 143/94 | HR 81 | Temp 97.4°F | Ht 73.0 in | Wt 230.8 lb

## 2014-06-04 DIAGNOSIS — K2970 Gastritis, unspecified, without bleeding: Secondary | ICD-10-CM

## 2014-06-04 DIAGNOSIS — K2100 Gastro-esophageal reflux disease with esophagitis, without bleeding: Secondary | ICD-10-CM | POA: Insufficient documentation

## 2014-06-04 DIAGNOSIS — K2990 Gastroduodenitis, unspecified, without bleeding: Secondary | ICD-10-CM

## 2014-06-04 DIAGNOSIS — R1032 Left lower quadrant pain: Secondary | ICD-10-CM

## 2014-06-04 DIAGNOSIS — K921 Melena: Secondary | ICD-10-CM

## 2014-06-04 NOTE — Patient Instructions (Signed)
Please have your CBC done. If you have another black stool, collect a specimen and return to our office.  I will discuss your recent symptoms with Dr. Oneida Alar, we will decide if you need further imaging, etc at this time.

## 2014-06-04 NOTE — Assessment & Plan Note (Signed)
Recurrent left lower quadrant pain associated with diarrhea. History of recurrent diverticulitis requiring partial sigmoid resection. 2014, favored segmental colitis rather than diverticulitis. CT in November early colitis versus diverticulitis per report. Patient did not improve with oral antibiotic therapy was seem to get better with IV antibiotics in the hospital. Well up until couple weeks ago when he had recurrent symptoms lasting 3-4 days.  At this time he is asymptomatic. We will discuss further with Dr. Oneida Alar. May consider repeat imaging with next episode. Colonoscopy 2014 without evidence of IBD. He has known malrotation as previously outlined.

## 2014-06-04 NOTE — Progress Notes (Signed)
Primary Care Physician: [name], MD  Primary Gastroenterologist:  Barney Drain, MD   Chief Complaint  Patient presents with  . Follow-up    HPI: Andrew Love is a 49 y.o. male here for hospital follow-up. Recent hospitalization for hematemesis. EGD showed erosive reflux esophagitis. Upper GI bleed felt to be trivial. However he had also had 1-2 month history of persisting left lower quadrant pain with CT scan back in November suggesting early acute colitis versus diverticulitis. He had been treated with Cipro and Flagyl as an outpatient but failed to improve. During recent hospitalization he did receive some IV antibiotic therapy with improvement of his left lower quadrant tenderness. Outpatient oral antibiotics. Similar episode in back in July 2014. At that time favored focal colitis rather than diverticulitis.  Patient states he was doing well up until about 2 weeks ago. Developed left lower quadrant abdominal pain associated with diarrhea which lasted about 3-4 days. Questions whether was something he ate. Notes that he eats terribly when he is at work. Has been trying to do better. Denies any recurrent vomiting. No fever or chills. His hospitalization his bowel function has been anywhere from constipation to diarrhea. Denies any blood in the stool. Notes a black stool the other day without history of Pepto or iron consumption. Happened only once. Overall he feels well at this time.  Notes that he did not get full sedation with EGD, remembers being awake and gagging a lot. Prior colonoscopy 2014 he had adequate sedation.  Denies any heartburn or indigestion on pantoprazole. Currently taking once daily. No dysphagia.   Current Outpatient Prescriptions  Medication Sig Dispense Refill  . aspirin 325 MG tablet Take 1 tablet (325 mg total) by mouth daily.    . diazepam (VALIUM) 5 MG tablet Take 1 tablet (5 mg total) by mouth every 12 (twelve) hours as needed for muscle  spasms. 10 tablet 0  . EPINEPHrine (EPIPEN) 0.3 mg/0.3 mL SOAJ Inject 0.3 mLs (0.3 mg total) into the muscle as needed. (Patient taking differently: Inject 0.3 mg into the muscle daily as needed (allergic reaction). ) 1 Device 3  . levothyroxine (SYNTHROID, LEVOTHROID) 300 MCG tablet Take 300 mcg by mouth daily.    Marland Kitchen olmesartan (BENICAR) 20 MG tablet Take 20 mg by mouth every evening.     . pantoprazole (PROTONIX) 40 MG tablet Take 1 tablet by mouth 2 time daily before a meal for 30 days then take 1 tab daily before a meal 60 tablet 0  . zolpidem (AMBIEN) 10 MG tablet Take 10 mg by mouth every morning.      No current facility-administered medications for this visit.    Allergies as of 06/04/2014 - Review Complete 06/04/2014  Allergen Reaction Noted  . Other Hives, Shortness Of Breath, and Itching 01/08/2013  . Iohexol Hives 03/31/2014    ROS:  General: Negative for anorexia, weight loss, fever, chills, fatigue, weakness. ENT: Negative for hoarseness, difficulty swallowing , nasal congestion. CV: Negative for chest pain, angina, palpitations, dyspnea on exertion, peripheral edema.  Respiratory: Negative for dyspnea at rest, dyspnea on exertion, cough, sputum, wheezing.  GI: See history of present illness. GU:  Negative for dysuria, hematuria, urinary incontinence, urinary frequency, nocturnal urination.  Endo: Negative for unusual weight change.    Physical Examination:   BP 143/94 mmHg  Pulse 81  Temp(Src) 97.4 F (36.3 C) (Oral)  Ht 6\' 1"  (1.854 m)  Wt 230 lb 12.8 oz (104.69 kg)  BMI  30.46 kg/m2  General: Well-nourished, well-developed in no acute distress.  Eyes: No icterus. Mouth: Oropharyngeal mucosa moist and pink , no lesions erythema or exudate. Lungs: Clear to auscultation bilaterally.  Heart: Regular rate and rhythm, no murmurs rubs or gallops.  Abdomen: Bowel sounds are normal, mild to moderate right mid-abd tenderness, nondistended, no hepatosplenomegaly or  masses, no abdominal bruits or hernia , no rebound or guarding.   Extremities: No lower extremity edema. No clubbing or deformities. Neuro: Alert and oriented x 4   Skin: Warm and dry, no jaundice.   Psych: Alert and cooperative, normal mood and affect.  Labs:  Lab Results  Component Value Date   WBC 7.4 04/16/2014   HGB 13.7 04/16/2014   HCT 38.9* 04/16/2014   MCV 92.0 04/16/2014   PLT 182 04/16/2014   Lab Results  Component Value Date   CREATININE 1.06 04/16/2014   BUN 11 04/16/2014   NA 144 04/16/2014   K 3.8 04/16/2014   CL 106 04/16/2014   CO2 25 04/16/2014   Lab Results  Component Value Date   ALT 41 04/15/2014   AST 25 04/15/2014   ALKPHOS 82 04/15/2014   BILITOT 0.8 04/15/2014   Lab Results  Component Value Date   TSH 0.074* 04/15/2014    Imaging Studies: No results found.

## 2014-06-04 NOTE — Assessment & Plan Note (Signed)
Questionable melena. Check CBC at this time. I FOBT with next episode.

## 2014-06-04 NOTE — Assessment & Plan Note (Signed)
Recent EGD showing erosive reflux esophagitis and reactive gastropathy. Clinically doing well. Continue daily PPI therapy. Questionable melena. See above.

## 2014-06-05 NOTE — Progress Notes (Signed)
cc'ed to pcp °

## 2014-06-25 NOTE — Progress Notes (Addendum)
REVIEWED. LAST TCS AUG 2014 FOR BRBR/DIARRHEA. COLON BX: NL, NL ILEUM. NOW WITH INTERMITTENT DIARRHEA/ABDOMINAL PAIN W/O RECTAL BLEEDING LIKELY DUE TO DIETARY INTOLERANCE, LESS LIKELY IBD. OPV IN 3 MOS E30 [facility] AND PT SHOULD CALL IF HE DEVELOP RECTAL BLEEDING AND TCS WILL BE SCHEDULED.

## 2014-06-26 NOTE — Progress Notes (Signed)
Please let patient know that Dr. Oneida Alar is recommending no further imaging or procedures right now.  He needs OV with [facility] only in 3 months with E30 slot.  He should call sooner if he develops rectal bleeding.

## 2014-06-29 ENCOUNTER — Telehealth: Payer: Self-pay

## 2014-06-29 NOTE — Telephone Encounter (Signed)
Pt is calling to talked to LSL. He said that he is having abd pain. He said that his pain level is at a 6.Friday and Sat he had a very  low grade fever and Sat he was very nauseated. His call back number is [phone]. Please advise

## 2014-06-30 NOTE — Progress Notes (Signed)
LMOM to call.

## 2014-06-30 NOTE — Telephone Encounter (Signed)
Tried to call patient, LMOAM.  Let's find out how he is doing, ie any temp over 101, persistent pain and location, any blood in stool or diarrhea.   We tried to call him today also to schedule OV with [facility] only in 3 months with E30 slot, see OV note 06/04/14 addendum.

## 2014-06-30 NOTE — Telephone Encounter (Signed)
I called pt. He said he is feeling some better, the abdominal pain is not as bad. He does not feel like he has fever now. His pain has been in his LLQ and at times is consistent. He has had very loose stools for several days having 5-7 BM's, not diarrhea. He took a couple of his wife's pain pills yesterday and it helped him and since then he has not had any more loose stools.  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

## 2014-07-01 NOTE — Telephone Encounter (Signed)
I think we should go ahead have an E30 OV with [facility] only now instead of waiting 3 months (having recurrent symptoms). If we cannot accommodate this within the next 2 weeks let me know.

## 2014-07-02 NOTE — Telephone Encounter (Signed)
LMOM that he will be scheduled and OV with Dr. Oneida Alar in a couple of weeks and I am mailing a reminder of that appt. Scheduled for Dr. Oneida Alar 07/15/2014 @ 9:30 AM.

## 2014-07-08 NOTE — Progress Notes (Signed)
Letter mailed to pt with appt with Dr.Fields on 07/15/2014 at 9:30 AM.

## 2014-07-10 ENCOUNTER — Encounter (HOSPITAL_COMMUNITY): Payer: Self-pay | Admitting: Emergency Medicine

## 2014-07-10 ENCOUNTER — Emergency Department (HOSPITAL_COMMUNITY)
Admission: EM | Admit: 2014-07-10 | Discharge: 2014-07-10 | Disposition: A | Discharge status: Home / Self Care | Payer: 59 | Attending: Emergency Medicine | Admitting: Emergency Medicine

## 2014-07-10 ENCOUNTER — Emergency Department (HOSPITAL_COMMUNITY): Payer: 59

## 2014-07-10 DIAGNOSIS — G8929 Other chronic pain: Secondary | ICD-10-CM | POA: Diagnosis not present

## 2014-07-10 DIAGNOSIS — M792 Neuralgia and neuritis, unspecified: Secondary | ICD-10-CM | POA: Diagnosis not present

## 2014-07-10 DIAGNOSIS — I2510 Atherosclerotic heart disease of native coronary artery without angina pectoris: Secondary | ICD-10-CM | POA: Diagnosis not present

## 2014-07-10 DIAGNOSIS — Z79899 Other long term (current) drug therapy: Secondary | ICD-10-CM | POA: Insufficient documentation

## 2014-07-10 DIAGNOSIS — Z9981 Dependence on supplemental oxygen: Secondary | ICD-10-CM | POA: Insufficient documentation

## 2014-07-10 DIAGNOSIS — Z87891 Personal history of nicotine dependence: Secondary | ICD-10-CM | POA: Insufficient documentation

## 2014-07-10 DIAGNOSIS — Z7982 Long term (current) use of aspirin: Secondary | ICD-10-CM | POA: Insufficient documentation

## 2014-07-10 DIAGNOSIS — Z8719 Personal history of other diseases of the digestive system: Secondary | ICD-10-CM | POA: Diagnosis not present

## 2014-07-10 DIAGNOSIS — G4730 Sleep apnea, unspecified: Secondary | ICD-10-CM | POA: Diagnosis not present

## 2014-07-10 DIAGNOSIS — Z9889 Other specified postprocedural states: Secondary | ICD-10-CM | POA: Insufficient documentation

## 2014-07-10 DIAGNOSIS — I10 Essential (primary) hypertension: Secondary | ICD-10-CM | POA: Insufficient documentation

## 2014-07-10 DIAGNOSIS — M1990 Unspecified osteoarthritis, unspecified site: Secondary | ICD-10-CM | POA: Insufficient documentation

## 2014-07-10 DIAGNOSIS — G4700 Insomnia, unspecified: Secondary | ICD-10-CM | POA: Diagnosis not present

## 2014-07-10 DIAGNOSIS — E039 Hypothyroidism, unspecified: Secondary | ICD-10-CM | POA: Diagnosis not present

## 2014-07-10 DIAGNOSIS — Z872 Personal history of diseases of the skin and subcutaneous tissue: Secondary | ICD-10-CM | POA: Insufficient documentation

## 2014-07-10 DIAGNOSIS — M545 Low back pain: Secondary | ICD-10-CM | POA: Diagnosis not present

## 2014-07-10 DIAGNOSIS — R52 Pain, unspecified: Secondary | ICD-10-CM

## 2014-07-10 DIAGNOSIS — M549 Dorsalgia, unspecified: Secondary | ICD-10-CM

## 2014-07-10 LAB — BASIC METABOLIC PANEL
Anion gap: 6 (ref 5–15)
BUN: 8 mg/dL (ref 6–23)
CO2: 24 mmol/L (ref 19–32)
Calcium: 9.1 mg/dL (ref 8.4–10.5)
Chloride: 109 mmol/L (ref 96–112)
Creatinine, Ser: 0.91 mg/dL (ref 0.50–1.35)
GFR calc Af Amer: >90 mL/min (ref >90)
GFR calc non Af Amer: >90 mL/min (ref >90)
Glucose, Bld: 110 mg/dL — ABNORMAL HIGH (ref 70–99)
Potassium: 3.6 mmol/L (ref 3.5–5.1)
Sodium: 139 mmol/L (ref 135–145)

## 2014-07-10 LAB — CBC WITH DIFFERENTIAL/PLATELET
Basophils Absolute: 0.1 10*3/uL (ref 0.0–0.1)
Basophils Relative: 1 % (ref 0–1)
Eosinophils Absolute: 0.2 10*3/uL (ref 0.0–0.7)
Eosinophils Relative: 3 % (ref 0–5)
HCT: 45.9 % (ref 39.0–52.0)
Hemoglobin: 16.5 g/dL (ref 13.0–17.0)
Lymphocytes Relative: 32 % (ref 12–46)
Lymphs Abs: 2.2 10*3/uL (ref 0.7–4.0)
MCH: 32.4 pg (ref 26.0–34.0)
MCHC: 35.9 g/dL (ref 30.0–36.0)
MCV: 90 fL (ref 78.0–100.0)
Monocytes Absolute: 0.5 10*3/uL (ref 0.1–1.0)
Monocytes Relative: 8 % (ref 3–12)
Neutro Abs: 3.8 10*3/uL (ref 1.7–7.7)
Neutrophils Relative %: 56 % (ref 43–77)
Platelets: 233 10*3/uL (ref 150–400)
RBC: 5.1 MIL/uL (ref 4.22–5.81)
RDW: 12 % (ref 11.5–15.5)
WBC: 6.7 10*3/uL (ref 4.0–10.5)

## 2014-07-10 LAB — TROPONIN I: Troponin I: <0.03 ng/mL (ref <0.031)

## 2014-07-10 MED ORDER — NAPROXEN 500 MG PO TABS: 500.0000 mg | ORAL_TABLET | Freq: Two times a day (BID) | ORAL | Status: DC

## 2014-07-10 MED ORDER — HYDROMORPHONE HCL 1 MG/ML IJ SOLN
1.0000 mg | Freq: Once | INTRAMUSCULAR | Status: AC
  Administered 2014-07-10: 1 mg via INTRAMUSCULAR
  Filled 2014-07-10: qty 1

## 2014-07-10 MED ORDER — ONDANSETRON 4 MG PO TBDP
4.0000 mg | ORAL_TABLET | Freq: Once | ORAL | Status: AC
  Administered 2014-07-10: 4 mg via ORAL
  Filled 2014-07-10: qty 1

## 2014-07-10 MED ORDER — OXYCODONE-ACETAMINOPHEN 5-325 MG PO TABS: 1.0000 | ORAL_TABLET | Freq: Four times a day (QID) | ORAL | Status: DC | PRN

## 2014-07-10 NOTE — ED Provider Notes (Signed)
CSN: [id number]     Arrival date & time 07/10/14  1619 History  This chart was scribed for Maudry Diego, MD by Edison Simon, ED Scribe. This patient was seen in room APA05/APA05 and the patient's care was started at 5:18 PM.    Chief Complaint  Patient presents with  . Back Pain  . Numbness   Patient is a 49 y.o. male presenting with back pain. The history is provided by the patient. No language interpreter was used.  Back Pain Location:  Lumbar spine Quality:  Unable to specify Radiates to:  L thigh Pain severity:  Moderate Pain is:  Same all the time Onset quality:  Sudden Duration:  3 days Timing:  Constant Progression:  Unchanged Chronicity:  Recurrent Context: physical stress   Relieved by:  None tried Worsened by:  Nothing tried Ineffective treatments:  None tried Associated symptoms: numbness   Associated symptoms: no abdominal pain, no chest pain and no headaches   Risk factors comment:  Hx of back problem   HPI Comments: Andrew Love is a 49 y.o. male who presents to the Emergency Department complaining of back pain with onset 2 days ago after playing golf. He reports history of back pain and states he has had workup involving MRI that found "minor disc issue but not enough to do anything about." He states he has been having some back pain when playing golf. He reports intermittent pain shooting down left thigh. He reports associated numbness to his left arm with onset yesterday with continued numbness today, especially to his thumb. He state it sometimes feels cold but usually feels like pins and needles. 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 He denies pain or numbness to neck.    Past Medical History  Diagnosis Date  . CAD (coronary artery disease)   . Elevated liver enzymes   . Diverticulitis     Hx of; requiring 3 admissions  . Sigmoid colon ulcer     Rectal polyps  . Thyroid disease   . Psoriasis     Per medical history form  dated 06/13/10.  . Chronic insomnia     Per medical history form dated 06/13/10.  . Colitis     Per medical history from dated 06/13/10.  Marland Kitchen Hypertension   . Hypothyroidism   . Bronchitis     history of  . Back pain     chronic  . Arthritis   . 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 Sleep apnea with use of continuous positive airway pressure (CPAP)    Past Surgical History  Procedure Laterality Date  . Colonoscopy  07/2008    Colitis,NSAID v. Ischemia,malrotation of the gut,Diverticulosis(L),hyperplastic  . Appendectomy      8th grade  . Shoulder surgery      Left  . Rotator cuff repair      Left - per medical history form dated 06/13/10.  . Thyroidectomy      Per medical history form dated 06/13/10.  . Laparoscopic sigmoid colectomy  2012    Dr. Fanny Skates: recurrent sigmoid diverticulitis  . Total hip arthroplasty  07/04/2011    Procedure: TOTAL HIP ARTHROPLASTY;  Surgeon: Johnny Bridge, MD;  Location: [facility];  Service: Orthopedics;  Laterality: Right;  . Joint replacement    . Cardiac catheterization  2009  . Cardiac catheterization      Per medical history from dated 06/13/10.  . Colonoscopy N/A  12/13/2012    Dr. Oneida Alar: Normal TI, mild sigmoid diverticulosis, hemorrhoids, 2 polyps (tubular adenoma). Random colon bx negative. Next TCS 12/2022 with Fentanyl/phenergan  . Esophagogastroduodenoscopy N/A 04/16/2014    RMR: Erosive reflux esophagitis. Non critical Schzki's ring not manipulated. Small hiatal hernia. Abnormal gastirc mucosa of doubtful signigicance status post biopsy. I suspect trivial upper GI bleed. Recent abdominal pain presumably secondary to a protracted bout of diverticulitis. CT scan November 23 revealed improvemetn without complication. He finished his antibiotics 2 days age. Left sided ab   Family History  Problem Relation Age of Onset  . Cancer Mother     breast cancer - per medical history form dated 06/13/10.  . Diverticulitis Mother   .  Hypertension Mother   . Cancer Father     skin - per medical history form dated 06/13/10.  Marland Kitchen Hypertension Father   . Anesthesia problems Neg Hx   . Hypotension Neg Hx   . Malignant hyperthermia Neg Hx   . Pseudochol deficiency Neg Hx   . Colon cancer Neg Hx    History  Substance Use Topics  . Smoking status: Former Smoker -- 0.25 packs/day for 18 years    Quit date: 08/15/2012  . Smokeless tobacco: Never Used     Comment:    . Alcohol Use: 0.0 oz/week    0 Standard drinks or equivalent per week     Comment: Occasional    Review of Systems  Constitutional: Negative for appetite change and fatigue.  HENT: Negative for congestion, ear discharge and sinus pressure.   Eyes: Negative for discharge.  Respiratory: Negative for cough.   Cardiovascular: Negative for chest pain.  Gastrointestinal: Negative for abdominal pain and diarrhea.  Genitourinary: Negative for frequency and hematuria.  Musculoskeletal: Positive for back pain. Negative for neck pain.  Skin: Negative for rash.  Neurological: Positive for numbness. Negative for seizures and headaches.  Psychiatric/Behavioral: Negative for hallucinations.      Allergies  Other and Iohexol  Home Medications   Prior to Admission medications   Medication Sig Start Date End Date Taking? Authorizing Provider  aspirin 325 MG tablet Take 1 tablet (325 mg total) by mouth daily. 03/21/14   Domenic Polite, MD  diazepam (VALIUM) 5 MG tablet Take 1 tablet (5 mg total) by mouth every 12 (twelve) hours as needed for muscle spasms. 07/30/13   Merryl Hacker, MD  EPINEPHrine (EPIPEN) 0.3 mg/0.3 mL SOAJ Inject 0.3 mLs (0.3 mg total) into the muscle as needed. Patient taking differently: Inject 0.3 mg into the muscle daily as needed (allergic reaction).  12/01/12   Teressa Lower, MD  levothyroxine (SYNTHROID, LEVOTHROID) 300 MCG tablet Take 300 mcg by mouth daily. 03/26/14   Historical Provider, MD  olmesartan (BENICAR) 20 MG tablet Take 20 mg by  mouth every evening.     Historical Provider, MD  pantoprazole (PROTONIX) 40 MG tablet Take 1 tablet by mouth 2 time daily before a meal for 30 days then take 1 tab daily before a meal 04/17/14   Radene Gunning, NP  zolpidem (AMBIEN) 10 MG tablet Take 10 mg by mouth every morning.     Historical Provider, MD   BP 132/92 mmHg  Pulse 79  Temp(Src) 98 F (36.7 C) (Oral)  Resp 16  Ht 6\' 1"  (1.854 m)  Wt 228 lb (103.42 kg)  BMI 30.09 kg/m2  SpO2 97% Physical Exam  Constitutional: He is oriented to person, place, and time. He appears well-developed.  HENT:  Head:  Normocephalic.  Eyes: Conjunctivae and EOM are normal. No scleral icterus.  Neck: Neck supple. No thyromegaly present.  Cardiovascular: Normal rate and regular rhythm.  Exam reveals no gallop and no friction rub.   No murmur heard. Pulmonary/Chest: No stridor. He has no wheezes. He has no rales. He exhibits no tenderness.  Abdominal: He exhibits no distension. There is no tenderness. There is no rebound.  Musculoskeletal: Normal range of motion. He exhibits tenderness. He exhibits no edema.  Moderate tenderness to lumbar spine  Lymphadenopathy:    He has no cervical adenopathy.  Neurological: He is oriented to person, place, and time. He exhibits normal muscle tone. Coordination normal.  Skin: No rash noted. No erythema.  Psychiatric: He has a normal mood and affect. His behavior is normal.  Nursing note and vitals reviewed.   ED Course  Procedures (including critical care time)  DIAGNOSTIC STUDIES: Oxygen Saturation is 97% on room air, normal by my interpretation.    COORDINATION OF CARE: 5:22 PM Discussed treatment plan with patient at beside, the patient agrees with the plan and has no further questions at this time.   Labs Review Labs Reviewed  CBC WITH DIFFERENTIAL/PLATELET  TROPONIN I  BASIC METABOLIC PANEL    Imaging Review No results found.   EKG Interpretation None      MDM   Final diagnoses:   None    Neuritis and back pain.  tx with naprosyn and percocet and follow up with pcp  The chart was scribed for me under my direct supervision.  I personally performed the history, physical, and medical decision making and all procedures in the evaluation of this patient.Maudry Diego, MD 07/10/14 [phone]

## 2014-07-10 NOTE — Discharge Instructions (Signed)
Follow up with your md next week for recheck °

## 2014-07-10 NOTE — ED Notes (Signed)
Patient states that he woke up this am with numbness in left arm and fingers slight weakness noted in left hand grip and left foot. Patient concerned about the numbness. States that his pain is constant. Patient states that he thinks he hurt his back 2 days ago playing golf.

## 2014-07-10 NOTE — ED Notes (Addendum)
Patient complaining of back pain x 3 days. States "I have back problems and I played golf Wednesday and have had back pain ever since." Patient also complaining of intermittent numbness in left arm with constant left thumb numbness since yesterday.

## 2014-07-10 NOTE — ED Notes (Signed)
Patient ambulatory to restroom, no deficits noted

## 2014-07-10 NOTE — ED Notes (Signed)
Pt alert & oriented x4, stable gait. Patient given discharge instructions, paperwork & prescription(s). Patient informed not to drive, operate any equipment & handel any important documents 4 hours after taking pain medication. Patient  instructed to stop at the registration desk to finish any additional paperwork. Patient  verbalized understanding. Pt left department w/ no further questions. 

## 2014-07-15 ENCOUNTER — Encounter: Payer: Self-pay | Admitting: Gastroenterology

## 2014-07-15 ENCOUNTER — Ambulatory Visit (INDEPENDENT_AMBULATORY_CARE_PROVIDER_SITE_OTHER): Payer: 59 | Admitting: Gastroenterology

## 2014-07-15 VITALS — BP 111/74 | HR 69 | Temp 98.1°F | Ht 73.0 in | Wt 233.0 lb

## 2014-07-15 DIAGNOSIS — R1032 Left lower quadrant pain: Secondary | ICD-10-CM

## 2014-07-15 MED ORDER — AMOXICILLIN-POT CLAVULANATE 500-125 MG PO TABS: ORAL_TABLET | ORAL | Status: DC

## 2014-07-15 MED ORDER — OXYCODONE-ACETAMINOPHEN 5-325 MG PO TABS: 1.0000 | ORAL_TABLET | Freq: Four times a day (QID) | ORAL | Status: DC | PRN

## 2014-07-15 MED ORDER — PANTOPRAZOLE SODIUM 40 MG PO TBEC: 40.0000 mg | DELAYED_RELEASE_TABLET | Freq: Two times a day (BID) | ORAL | Status: DC

## 2014-07-15 NOTE — Progress Notes (Signed)
ON RECALL LIST  °

## 2014-07-15 NOTE — Progress Notes (Signed)
Subjective:    Patient ID: Andrew Love, male    DOB: 1965-05-30, 49 y.o.   MRN: [id number]  HPI 2-3 WEEKS AGO WOKE UP WITH ABD PAIN/MUSHY STOOL(5-7/DAY) FOR ABOUT 2 WEEKS BUT NOW HAVING SOFT STOOL. NEVER WATERY. WEIGHT UP AND DOWN DURING THAT TIME. NOW BMS: 2 PER DAY. PAIN LASTED ABOUT 4-5 DAYS. HAD CHILLS NO FEVER. MILD NAUSEA DURING THAT TIME BUT NOT NOW. APPETITE: KEPT EATING. ABD PAIN : LLQ(SHARP, ALL THE TIME AND VARIED IN INTENSITY). TOOK PAIN MEDS TWICE. NO ASA OR NSAIDS. TAKING PROTONIX 1 DAILY.  PT DENIES FEVER, CHILLS, HEMATOCHEZIA,  vomiting, melena, CHEST PAIN, SHORTNESS OF BREATH,  Constipation, problems swallowing, OR heartburn or indigestion.   Past Medical History  Diagnosis Date  . CAD (coronary artery disease)   . Elevated liver enzymes   . Diverticulitis     Hx of; requiring 3 admissions  . Sigmoid colon ulcer     Rectal polyps  . Thyroid disease   . Psoriasis     Per medical history form dated 06/13/10.  . Chronic insomnia     Per medical history form dated 06/13/10.  . Colitis     Per medical history from dated 06/13/10.  Marland Kitchen Hypertension   . Hypothyroidism   . Bronchitis     history of  . Back pain     chronic  . Arthritis   . 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 Sleep apnea with use of continuous positive airway pressure (CPAP)    Past Surgical History  Procedure Laterality Date  . Colonoscopy  07/2008    Colitis,NSAID v. Ischemia,malrotation of the gut,Diverticulosis(L),hyperplastic  . Appendectomy      8th grade  . Shoulder surgery      Left  . Rotator cuff repair      Left - per medical history form dated 06/13/10.  . Thyroidectomy      Per medical history form dated 06/13/10.  . Laparoscopic sigmoid colectomy  2012    Dr. Fanny Skates: recurrent sigmoid diverticulitis  . Total hip arthroplasty  07/04/2011    Procedure: TOTAL HIP ARTHROPLASTY;  Surgeon: Johnny Bridge, MD;  Location: [facility];  Service: Orthopedics;   Laterality: Right;  . Joint replacement    . Cardiac catheterization  2009  . Cardiac catheterization      Per medical history from dated 06/13/10.  . Colonoscopy N/A 12/13/2012    Dr. Oneida Alar: Normal TI, mild sigmoid diverticulosis, hemorrhoids, 2 polyps (tubular adenoma). Random colon bx negative. Next TCS 12/2022 with Fentanyl/phenergan  . Esophagogastroduodenoscopy N/A 04/16/2014    RMR: Erosive reflux esophagitis. Non critical Schzki's ring not manipulated. Small hiatal hernia. Abnormal gastirc mucosa of doubtful signigicance status post biopsy. I suspect trivial upper GI bleed. Recent abdominal pain presumably secondary to a protracted bout of diverticulitis. CT scan November 23 revealed improvemetn without complication. He finished his antibiotics 2 days age. Left sided ab   Allergies  Allergen Reactions  . Other Hives, Shortness Of Breath and Itching    Insect bite- bug name unknown=  . Iohexol Hives    Current Outpatient Prescriptions  Medication Sig Dispense Refill  . diazepam (VALIUM) 5 MG tablet Take 1 tablet (5 mg total) by mouth every 12 (twelve) hours as needed for muscle spasms.    Marland Kitchen EPINEPHrine (EPIPEN) 0.3 mg/0.3 mL SOAJ Inject 0.3 mLs (0.3 mg total) into the muscle as needed.    Marland Kitchen levothyroxine (SYNTHROID, LEVOTHROID) 300 MCG tablet Take 300  mcg by mouth daily.    . naproxen (NAPROSYN) 500 MG tablet Take 1 tablet (500 mg total) by mouth 2 (two) times daily.    Marland Kitchen olmesartan (BENICAR) 20 MG tablet Take 20 mg by mouth daily.     Marland Kitchen oxyCODONE-acetaminophen (PERCOCET/ROXICET) 5-325 MG per tablet Take 1 tablet by mouth every 6 (six) hours as needed.    . pantoprazole (PROTONIX) 40 MG tablet Take 1 po bid    . zolpidem (AMBIEN) 10 MG tablet Take 10 mg by mouth every morning.      Review of Systems     Objective:   Physical Exam  Constitutional: He is oriented to person, place, and time. He appears well-developed and well-nourished. No distress.  HENT:  Head: Normocephalic and  atraumatic.  Mouth/Throat: Oropharynx is clear and moist. No oropharyngeal exudate.  Eyes: Pupils are equal, round, and reactive to light. No scleral icterus.  Neck: Normal range of motion. Neck supple.  Cardiovascular: Normal rate, regular rhythm and normal heart sounds.   Pulmonary/Chest: Effort normal and breath sounds normal. No respiratory distress.  Abdominal: Soft. Bowel sounds are normal. He exhibits no distension. There is no tenderness.  Musculoskeletal: He exhibits no edema.  Lymphadenopathy:    He has no cervical adenopathy.  Neurological: He is alert and oriented to person, place, and time.  NO FOCAL DEFICITS   Psychiatric: He has a normal mood and affect.  Vitals reviewed.         Assessment & Plan:

## 2014-07-15 NOTE — Assessment & Plan Note (Signed)
ASSOCIATED WITH CHANGE IN BOWEL HABITS AND LIKELY DUE TO RECURRENT DESCENDING COLITIS/DIVERTICULITIS. SX PARTIALLY RESOLVED. LAST TCS AUG 2014.  AUGMENTIN BID FOR 7 DAYS. ONE REFILL TO HAVE IF PT DEVELOPS SYMPTOMS WHILE ON VACATION. DRINK WATER EAT FIBER FOLLOW UP IN 4 MOS.  PT WILL CALL WITH QUESTIONS OR CONCERNS. PT WILL NEED CT SCAN OF ABD/PELVIS WITHY NEXT FLARE. DISCUSSED BENEFITS, RISKS, AND MANAGEMENT OF DIVERICULITIS. IF RECURRENT FINDING ON CT PT SHOULD CONSIDER LEFT COLECTOMY.

## 2014-07-15 NOTE — Progress Notes (Signed)
CC'ED TO PCP 

## 2014-07-15 NOTE — Patient Instructions (Signed)
TAKE AUGMENTIN TWICE DAILY FOR 7 DAYS. ONE REFILL IS ON FILE TO HAVE IF YOU DEVELOP SYMPTOMS WHILE ON VACATION.  USE PERCOCET AS NEEDED FOR ABDOMIANL PAIN.  DRINK WATER TO KEEP YOUR URINE LIGHT YELLOW.  EAT FIBER. AVOID FOODS THAT CAUSE A FLARE.  FOLLOW UP IN 4 MOS.   YOU WILL NEED A CT SCAN  WITH YOUR NEXT FLARE. IF RECURRENT FINDING ON CT YOU SHOULD CONSIDER A  LEFT COLECTOMY.

## 2014-10-21 ENCOUNTER — Encounter: Payer: Self-pay | Admitting: Gastroenterology

## 2014-10-23 ENCOUNTER — Other Ambulatory Visit: Payer: Self-pay | Admitting: Sports Medicine

## 2014-10-23 DIAGNOSIS — M5126 Other intervertebral disc displacement, lumbar region: Secondary | ICD-10-CM

## 2014-10-23 DIAGNOSIS — M545 Low back pain: Secondary | ICD-10-CM

## 2014-10-23 DIAGNOSIS — M5416 Radiculopathy, lumbar region: Secondary | ICD-10-CM

## 2014-11-06 ENCOUNTER — Ambulatory Visit
Admission: RE | Admit: 2014-11-06 | Discharge: 2014-11-06 | Disposition: A | Discharge status: Home / Self Care | Payer: Commercial Managed Care - HMO | Source: Ambulatory Visit | Attending: Sports Medicine | Admitting: Sports Medicine

## 2014-11-06 DIAGNOSIS — M5126 Other intervertebral disc displacement, lumbar region: Secondary | ICD-10-CM

## 2014-11-06 DIAGNOSIS — M5416 Radiculopathy, lumbar region: Secondary | ICD-10-CM

## 2014-11-06 DIAGNOSIS — M545 Low back pain: Secondary | ICD-10-CM

## 2014-12-28 ENCOUNTER — Encounter (HOSPITAL_COMMUNITY): Payer: Self-pay | Admitting: *Deleted

## 2014-12-28 ENCOUNTER — Emergency Department (HOSPITAL_COMMUNITY)
Admission: EM | Admit: 2014-12-28 | Discharge: 2014-12-28 | Disposition: A | Discharge status: Home / Self Care | Payer: Commercial Managed Care - HMO | Attending: Emergency Medicine | Admitting: Emergency Medicine

## 2014-12-28 DIAGNOSIS — Z791 Long term (current) use of non-steroidal anti-inflammatories (NSAID): Secondary | ICD-10-CM | POA: Insufficient documentation

## 2014-12-28 DIAGNOSIS — M79662 Pain in left lower leg: Secondary | ICD-10-CM

## 2014-12-28 DIAGNOSIS — Z8709 Personal history of other diseases of the respiratory system: Secondary | ICD-10-CM | POA: Insufficient documentation

## 2014-12-28 DIAGNOSIS — R200 Anesthesia of skin: Secondary | ICD-10-CM | POA: Diagnosis not present

## 2014-12-28 DIAGNOSIS — R110 Nausea: Secondary | ICD-10-CM | POA: Insufficient documentation

## 2014-12-28 DIAGNOSIS — Z872 Personal history of diseases of the skin and subcutaneous tissue: Secondary | ICD-10-CM | POA: Insufficient documentation

## 2014-12-28 DIAGNOSIS — M1990 Unspecified osteoarthritis, unspecified site: Secondary | ICD-10-CM | POA: Diagnosis not present

## 2014-12-28 DIAGNOSIS — I2510 Atherosclerotic heart disease of native coronary artery without angina pectoris: Secondary | ICD-10-CM | POA: Insufficient documentation

## 2014-12-28 DIAGNOSIS — F5104 Psychophysiologic insomnia: Secondary | ICD-10-CM | POA: Diagnosis not present

## 2014-12-28 DIAGNOSIS — Z9889 Other specified postprocedural states: Secondary | ICD-10-CM | POA: Diagnosis not present

## 2014-12-28 DIAGNOSIS — M79605 Pain in left leg: Secondary | ICD-10-CM | POA: Diagnosis not present

## 2014-12-28 DIAGNOSIS — Z720 Tobacco use: Secondary | ICD-10-CM | POA: Diagnosis not present

## 2014-12-28 DIAGNOSIS — E039 Hypothyroidism, unspecified: Secondary | ICD-10-CM | POA: Insufficient documentation

## 2014-12-28 DIAGNOSIS — R531 Weakness: Secondary | ICD-10-CM | POA: Diagnosis present

## 2014-12-28 DIAGNOSIS — G4730 Sleep apnea, unspecified: Secondary | ICD-10-CM | POA: Insufficient documentation

## 2014-12-28 DIAGNOSIS — Z8719 Personal history of other diseases of the digestive system: Secondary | ICD-10-CM | POA: Diagnosis not present

## 2014-12-28 DIAGNOSIS — Z79899 Other long term (current) drug therapy: Secondary | ICD-10-CM | POA: Diagnosis not present

## 2014-12-28 DIAGNOSIS — G4700 Insomnia, unspecified: Secondary | ICD-10-CM | POA: Insufficient documentation

## 2014-12-28 DIAGNOSIS — I10 Essential (primary) hypertension: Secondary | ICD-10-CM | POA: Insufficient documentation

## 2014-12-28 DIAGNOSIS — M7989 Other specified soft tissue disorders: Secondary | ICD-10-CM

## 2014-12-28 DIAGNOSIS — G8929 Other chronic pain: Secondary | ICD-10-CM | POA: Insufficient documentation

## 2014-12-28 NOTE — ED Notes (Signed)
Pt alert & oriented x4, stable gait. Patient given discharge instructions, paperwork & prescription(s). Patient  instructed to stop at the registration desk to finish any additional paperwork. Patient verbalized understanding. Pt left department w/ no further questions. 

## 2014-12-28 NOTE — Discharge Instructions (Signed)
Return tomorrow for an ultrasound of your left leg.

## 2014-12-28 NOTE — ED Notes (Signed)
Back injury in June bulging disc l3-4 & l5-6. Pt says today has a knot from on the medial aspect on the left lower leg.

## 2014-12-28 NOTE — ED Notes (Signed)
Pt c/o left sided weakness with nausea; pt states he has bulging discs and he called his neurosurgeon and was told to come to ED to make sure he does not have an blood clot or and infection

## 2014-12-28 NOTE — ED Provider Notes (Signed)
CSN: [id number]     Arrival date & time 12/28/14  2111 History  This chart was scribed for Andrew Manifold, MD by Irene Pap, ED Scribe. This patient was seen in room APA03/APA03 and patient care was started at 9:25 PM.   Chief Complaint  Patient presents with  . Weakness   The history is provided by the patient. No language interpreter was used.   HPI Comments: ROBERT Love is a 49 y.o. male with a hx of chronic back pain, CAD, and HTN who presents to the Emergency Department complaining of radiating, gradually worsening left-sided weakness and pain onset 5 hours ago. He states that the pain first started radiating from his back and hip down to his knee a few weeks ago, but the pain that radiates down to inner calf and foot started at 4 PM today. Pt reports associated nausea, numbness, and left leg swelling. He states that it feels like his bones are going to break when he walks on his leg due to weakness; states that pain worsens with movement and weight bearing. He states that he has bulging discs in the L3 and L4 for which he has been followed by a neurologist who told him to come to the ED to be evaluated for possible blood clot or infection. He states that he last injured his back in June. He denies recent surgeries or long distance travel.   Past Medical History  Diagnosis Date  . CAD (coronary artery disease)   . Elevated liver enzymes   . Diverticulitis     Hx of; requiring 3 admissions  . Sigmoid colon ulcer     Rectal polyps  . Thyroid disease   . Psoriasis     Per medical history form dated 06/13/10.  . Chronic insomnia     Per medical history form dated 06/13/10.  . Colitis     Per medical history from dated 06/13/10.  Marland Kitchen Hypertension   . Hypothyroidism   . Bronchitis     history of  . Back pain     chronic  . Arthritis   . 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 Sleep apnea with use of continuous positive airway pressure (CPAP)    Past Surgical  History  Procedure Laterality Date  . Colonoscopy  07/2008    Colitis,NSAID v. Ischemia,malrotation of the gut,Diverticulosis(L),hyperplastic  . Appendectomy      8th grade  . Shoulder surgery      Left  . Rotator cuff repair      Left - per medical history form dated 06/13/10.  . Thyroidectomy      Per medical history form dated 06/13/10.  . Laparoscopic sigmoid colectomy  2012    Dr. Fanny Skates: recurrent sigmoid diverticulitis  . Total hip arthroplasty  07/04/2011    Procedure: TOTAL HIP ARTHROPLASTY;  Surgeon: Johnny Bridge, MD;  Location: [facility];  Service: Orthopedics;  Laterality: Right;  . Joint replacement    . Cardiac catheterization  2009  . Cardiac catheterization      Per medical history from dated 06/13/10.  . Colonoscopy N/A 12/13/2012    Dr. Oneida Alar: Normal TI, mild sigmoid diverticulosis, hemorrhoids, 2 polyps (tubular adenoma). Random colon bx negative. Next TCS 12/2022 with Fentanyl/phenergan  . Esophagogastroduodenoscopy N/A 04/16/2014    RMR: Erosive reflux esophagitis. Non critical Schzki's ring not manipulated. Small hiatal hernia. Abnormal gastirc mucosa of doubtful signigicance status post biopsy. I suspect trivial upper GI bleed. Recent abdominal pain presumably  secondary to a protracted bout of diverticulitis. CT scan November 23 revealed improvemetn without complication. He finished his antibiotics 2 days age. Left sided ab   Family History  Problem Relation Age of Onset  . Cancer Mother     breast cancer - per medical history form dated 06/13/10.  . Diverticulitis Mother   . Hypertension Mother   . Cancer Father     skin - per medical history form dated 06/13/10.  Marland Kitchen Hypertension Father   . Anesthesia problems Neg Hx   . Hypotension Neg Hx   . Malignant hyperthermia Neg Hx   . Pseudochol deficiency Neg Hx   . Colon cancer Neg Hx    Social History  Substance Use Topics  . Smoking status: Current Some Day Smoker -- 0.25 packs/day for 18 years    Last Attempt  to Quit: 08/15/2012  . Smokeless tobacco: Never Used     Comment:    . Alcohol Use: 0.0 oz/week    0 Standard drinks or equivalent per week     Comment: Occasional    Review of Systems 10 Systems reviewed and all are negative for acute change except as noted in the HPI.  Allergies  Other and Iohexol  Home Medications   Prior to Admission medications   Medication Sig Start Date End Date Taking? Authorizing Provider  amoxicillin-clavulanate (AUGMENTIN) 500-125 MG per tablet 1 PO BID FOR 7 DAYS 07/15/14   Danie Binder, MD  diazepam (VALIUM) 5 MG tablet Take 1 tablet (5 mg total) by mouth every 12 (twelve) hours as needed for muscle spasms. 07/30/13   Merryl Hacker, MD  EPINEPHrine (EPIPEN) 0.3 mg/0.3 mL SOAJ Inject 0.3 mLs (0.3 mg total) into the muscle as needed. Patient taking differently: Inject 0.3 mg into the muscle daily as needed (allergic reaction).  12/01/12   Teressa Lower, MD  levothyroxine (SYNTHROID, LEVOTHROID) 300 MCG tablet Take 300 mcg by mouth daily. 03/26/14   Historical Provider, MD  naproxen (NAPROSYN) 500 MG tablet Take 1 tablet (500 mg total) by mouth 2 (two) times daily. 07/10/14   Milton Ferguson, MD  olmesartan (BENICAR) 20 MG tablet Take 20 mg by mouth daily.     Historical Provider, MD  oxyCODONE-acetaminophen (PERCOCET/ROXICET) 5-325 MG per tablet Take 1 tablet by mouth every 6 (six) hours as needed. 07/15/14   Danie Binder, MD  pantoprazole (PROTONIX) 40 MG tablet Take 1 tablet (40 mg total) by mouth 2 (two) times daily before a meal. Take 1 tablet by mouth 2 time daily before a meal for 30 days then take 1 tab daily before a meal 07/15/14   Danie Binder, MD  zolpidem (AMBIEN) 10 MG tablet Take 10 mg by mouth every morning.     Historical Provider, MD   BP 129/89 mmHg  Pulse 74  Temp(Src) 98.4 F (36.9 C) (Oral)  Resp 20  Ht 6\' 1"  (1.854 m)  Wt 231 lb (104.781 kg)  BMI 30.48 kg/m2  SpO2 98%  Physical Exam  Constitutional: He is oriented to person, place,  and time. He appears well-developed and well-nourished.  HENT:  Head: Normocephalic and atraumatic.  Eyes: EOM are normal. Pupils are equal, round, and reactive to light.  Neck: Normal range of motion. Neck supple.  Cardiovascular: Normal rate and regular rhythm.   Pulmonary/Chest: Effort normal.  Abdominal: Soft. There is no tenderness.  Musculoskeletal: Normal range of motion.  Mild edema from below the left knee; mild tenderness to palpation of left  calf; increased calf pain with dorsiflexion; no rash or other skin changes; mild decreased sensation to light touch of left lower extremity, otherwise NVI.   Neurological: He is alert and oriented to person, place, and time.  Skin: Skin is warm and dry.  Psychiatric: He has a normal mood and affect. His behavior is normal.  Nursing note and vitals reviewed.   ED Course  Procedures (including critical care time) DIAGNOSTIC STUDIES: Oxygen Saturation is 98% on RA, normal by my interpretation.    COORDINATION OF CARE: 9:30 PM-Discussed treatment plan which includes US of the leg in the morning with pt at bedside and pt agreed to plan.   Labs Review Labs Reviewed - No data to display  Imaging Review No results found.    EKG Interpretation None      MDM   Final diagnoses:  Left leg pain    49yM with atraumatic L leg pain. Some concern for DVT. will return for [place]. It has been determined that no acute conditions requiring further emergency intervention are present at this time. The patient has been advised of the diagnosis and plan. I reviewed any labs and imaging including any potential incidental findings. We have discussed signs and symptoms that warrant return to the ED and they are listed in the discharge instructions.   I personally preformed the services scribed in my presence. The recorded information has been reviewed is accurate. Andrew Manifold, MD.    Andrew Manifold, MD 01/21/15 [phone]

## 2014-12-29 ENCOUNTER — Other Ambulatory Visit: Payer: Self-pay | Admitting: Neurosurgery

## 2014-12-29 ENCOUNTER — Ambulatory Visit (HOSPITAL_COMMUNITY)
Admission: RE | Admit: 2014-12-29 | Discharge: 2014-12-29 | Disposition: A | Discharge status: Home / Self Care | Payer: Commercial Managed Care - HMO | Source: Ambulatory Visit | Attending: Emergency Medicine | Admitting: Emergency Medicine

## 2014-12-29 DIAGNOSIS — M7989 Other specified soft tissue disorders: Secondary | ICD-10-CM | POA: Diagnosis present

## 2014-12-29 DIAGNOSIS — M79662 Pain in left lower leg: Secondary | ICD-10-CM

## 2014-12-29 DIAGNOSIS — M5416 Radiculopathy, lumbar region: Secondary | ICD-10-CM

## 2014-12-29 NOTE — ED Provider Notes (Signed)
Venous ultrasound imaging of the left leg has been performed, and is negative. I informed the patient of the finding. He had no other questions or concerns. He will continue the same treatment.  Daleen Bo, MD 12/29/14 1122

## 2015-01-04 ENCOUNTER — Ambulatory Visit
Admission: RE | Admit: 2015-01-04 | Discharge: 2015-01-04 | Disposition: A | Discharge status: Home / Self Care | Payer: Commercial Managed Care - HMO | Source: Ambulatory Visit | Attending: Neurosurgery | Admitting: Neurosurgery

## 2015-01-04 DIAGNOSIS — M5416 Radiculopathy, lumbar region: Secondary | ICD-10-CM

## 2015-01-04 MED ORDER — IOHEXOL 180 MG/ML  SOLN
15.0000 mL | Freq: Once | INTRAMUSCULAR | Status: DC | PRN
  Administered 2015-01-04: 15 mL via INTRATHECAL

## 2015-01-04 MED ORDER — DIAZEPAM 5 MG PO TABS
10.0000 mg | ORAL_TABLET | Freq: Once | ORAL | Status: AC
  Administered 2015-01-04: 10 mg via ORAL

## 2015-01-04 MED ORDER — ONDANSETRON HCL 4 MG/2ML IJ SOLN
4.0000 mg | Freq: Once | INTRAMUSCULAR | Status: AC
  Administered 2015-01-04: 4 mg via INTRAMUSCULAR

## 2015-01-04 MED ORDER — MEPERIDINE HCL 100 MG/ML IJ SOLN
100.0000 mg | Freq: Once | INTRAMUSCULAR | Status: AC
  Administered 2015-01-04: 100 mg via INTRAMUSCULAR

## 2015-01-04 NOTE — Discharge Instructions (Signed)

## 2015-01-04 NOTE — Progress Notes (Signed)
Patient states he took Benadryl 50mg  PO an hour prior to injection.

## 2015-02-22 ENCOUNTER — Ambulatory Visit (HOSPITAL_COMMUNITY): Payer: Commercial Managed Care - HMO | Attending: Neurosurgery | Admitting: Physical Therapy

## 2015-02-22 DIAGNOSIS — M545 Low back pain, unspecified: Secondary | ICD-10-CM

## 2015-02-22 DIAGNOSIS — R262 Difficulty in walking, not elsewhere classified: Secondary | ICD-10-CM | POA: Diagnosis present

## 2015-02-22 DIAGNOSIS — M6281 Muscle weakness (generalized): Secondary | ICD-10-CM | POA: Diagnosis present

## 2015-02-22 DIAGNOSIS — M25552 Pain in left hip: Secondary | ICD-10-CM

## 2015-02-22 DIAGNOSIS — R293 Abnormal posture: Secondary | ICD-10-CM | POA: Insufficient documentation

## 2015-02-22 NOTE — Patient Instructions (Signed)
   BRIDGING  While lying on your back, tighten your lower abdominals, squeeze your buttocks and then raise your buttocks off the floor/bed as creating a "Bridge" with your body.  Repeat 10 times, twice a day.    HIP ABDUCTION - SIDELYING  While lying on your side, slowly raise up your top leg to the side. Keep your knee straight and maintain your toes pointed forward the entire time.   The bottom leg can be bent to stabilize your body.  Repeat 10 times each leg, twice a day.    PRONE HIP EXTENSION  While lying face down with your knee straight, slowly raise up leg off the ground.  Repeat 10 times each leg, twice a day.  TUMMY SQUEEZES  You can do this exercise in sitting, laying down, standing- whichever is most comfortable for you. Squeeze your stomach muscles so it feels like your belly button is squeezing to your belly button. Hold for 2 seconds, and relax. Repeat 20 times, 5 times per day.

## 2015-02-22 NOTE — Therapy (Signed)
[place], [place], [address] Phone: [phone]   Fax:  [phone]  Physical Therapy Evaluation  Patient Details  Name: Andrew Love MRN: [id number] Date of Birth: [birth year] Referring Provider: Kary Kos, MD   Encounter Date: 02/22/2015      PT End of Session - 02/22/15 1731    Visit Number 1   Number of Visits 16   Date for PT Re-Evaluation 03/22/15   Authorization Type UHC    Authorization Time Period 02/22/15 to 04/24/15   PT Start Time 1646   PT Stop Time 1728   PT Time Calculation (min) 42 min   Activity Tolerance Patient tolerated treatment well   Behavior During Therapy [facility] for tasks assessed/performed      Past Medical History  Diagnosis Date  . CAD (coronary artery disease)   . Elevated liver enzymes   . Diverticulitis     Hx of; requiring 3 admissions  . Sigmoid colon ulcer     Rectal polyps  . Thyroid disease   . Psoriasis     Per medical history form dated 06/13/10.  . Chronic insomnia     Per medical history form dated 06/13/10.  . Colitis     Per medical history from dated 06/13/10.  Marland Kitchen Hypertension   . Hypothyroidism   . Bronchitis     history of  . Back pain     chronic  . Arthritis   . 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 Sleep apnea with use of continuous positive airway pressure (CPAP)     Past Surgical History  Procedure Laterality Date  . Colonoscopy  07/2008    Colitis,NSAID v. Ischemia,malrotation of the gut,Diverticulosis(L),hyperplastic  . Appendectomy      8th grade  . Shoulder surgery      Left  . Rotator cuff repair      Left - per medical history form dated 06/13/10.  . Thyroidectomy      Per medical history form dated 06/13/10.  . Laparoscopic sigmoid colectomy  2012    Dr. Fanny Skates: recurrent sigmoid diverticulitis  . Total hip arthroplasty  07/04/2011    Procedure: TOTAL HIP ARTHROPLASTY;  Surgeon: Johnny Bridge, MD;   Location: [place];  Service: Orthopedics;  Laterality: Right;  . Joint replacement    . Cardiac catheterization  2009  . Cardiac catheterization      Per medical history from dated 06/13/10.  . Colonoscopy N/A 12/13/2012    Dr. Oneida Alar: Normal TI, mild sigmoid diverticulosis, hemorrhoids, 2 polyps (tubular adenoma). Random colon bx negative. Next TCS 12/2022 with Fentanyl/phenergan  . Esophagogastroduodenoscopy N/A 04/16/2014    RMR: Erosive reflux esophagitis. Non critical Schzki's ring not manipulated. Small hiatal hernia. Abnormal gastirc mucosa of doubtful signigicance status post biopsy. I suspect trivial upper GI bleed. Recent abdominal pain presumably secondary to a protracted bout of diverticulitis. CT scan November 23 revealed improvemetn without complication. He finished his antibiotics 2 days age. Left sided ab    There were no vitals filed for this visit.  Visit Diagnosis:  Left-sided low back pain without sciatica - Plan: PT plan of care cert/re-cert  Muscle weakness - Plan: PT plan of care cert/re-cert  Poor posture - Plan: PT plan of care cert/re-cert  Difficulty walking - Plan: PT plan of care cert/re-cert  Left hip pain - Plan: PT plan of care cert/re-cert      Subjective Assessment - 02/22/15 1650  Subjective Stiff and sore; tries to do as much as he can but is having some hip pain, which he had before the surgery. Reports taht he is starting to feel better. Not wearing a brace. Patient cannot bend or lift, no long distance trips driving, cannot twist.    Pertinent History Patient has had back pain ever since he can remember, but in June he picked up and moved a plastic table after which his new course of pain started. Went to a variety of doctors, got referred eventually to Dr. Saintclair Halsted. He went through shots for his back, tried a Restaurant manager, fast food, tried therapy but was in too much pain, and failed all conservative treatmetns. Surgery was done September 30th with Dr. Saintclair Halsted,.   How  long can you sit comfortably? 5 minutes    How long can you stand comfortably? 5 minutes    How long can you walk comfortably? Painful but no real limitations    Patient Stated Goals to be active again, pain free    Currently in Pain? Yes   Pain Score 7    Pain Location Back   Pain Orientation Lower;Left            [facility] PT Assessment - 02/22/15 0001    Assessment   Medical Diagnosis DDD, lumbosacral   Referring Provider Kary Kos, MD    Onset Date/Surgical Date 02/05/15   Next MD Visit November with Dr. Saintclair Halsted    Precautions   Precautions Back   Precaution Comments no bending, lifting, twisting per MD (post-surgical precautions); history of R hip replacement    Restrictions   Weight Bearing Restrictions No   Balance Screen   Has the patient fallen in the past 6 months Yes   How many times? [place] when he was moving hte table that set this course of pain off, also fell again later due to weakness and pain   Has the patient had a decrease in activity level because of a fear of falling?  Yes   Is the patient reluctant to leave their home because of a fear of falling?  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 with breaks interspersed through the day    Posture/Postural Control   Posture Comments flexed at hips, forward head with bilateral internal rotation of shoulders,   AROM   Right Hip External Rotation  --  WFL    Right Hip Internal Rotation  30   Left Hip External Rotation  --  WFL but functionally tight    Left Hip Internal Rotation  15   Strength   Right Hip Flexion 5/5   Right Hip Extension 4/5   Right Hip ABduction 4-/5   Left Hip Flexion 3/5   Left Hip Extension 4/5   Left Hip ABduction 4-/5   Right Knee Flexion 4+/5   Right Knee Extension 4+/5   Left Knee  Flexion 4+/5   Left Knee Extension 4+/5   Right Ankle Dorsiflexion 5/5   Left Ankle Dorsiflexion 5/5   Flexibility   Hamstrings moderate tightness bilatearlly    Quadriceps significatn tightness noted bilateral quads    Ambulation/Gait   Gait Comments reduced gait speed, reduced rotation of hips and trunk, flexed at hips    6 minute walk test results    Endurance additional comments 6 MWT 1250ft,  1.78m/s    High Level Balance   High Level Balance Comments TUG 11.5, 10.5, 9.3                           PT Education - 02/22/15 1730    Education provided Yes   Education Details prognosis, HEP, plan of care    Person(s) Educated Patient   Methods Explanation;Handout   Comprehension Verbalized understanding          PT Short Term Goals - 02/22/15 1736    PT SHORT TERM GOAL #1   Title Patient to experience no more than 5/10 pain during all functional tasks and activities in sitting or standing of at least 60 minutes duration    Time 4   Period Weeks   Status New   PT SHORT TERM GOAL #2   Title Patient to be able to verbalize the importance of good posture and will be able to maintain good posture throughout all functional tasksk with cues no more than 20% of the time    Time 4   Period Weeks   Status New   PT SHORT TERM GOAL #3   Title Patient to be able to perform supine to sit/sit to supine and sit to stand/stand to sit transitions with pain no more than 3/10, good sequencing and mechanics throughout    Time 4   Period Weeks   Status New   PT SHORT TERM GOAL #4   Title Patient to demonstrate L hip range improvement of internal and external rotation by at least 10 degrees in order to reduce pain and improve overall functional mechanics    Time 4   Period Weeks   Status New   PT SHORT TERM GOAL #5   Title Patient to be independent in correctly and consistently performing appropriate HEP, to be updated PRN    Time 4   Period Weeks   Status New            PT Long Term Goals - 02/22/15 1738    PT LONG TERM GOAL #1   Title Patient to demonstrate 5/5 strength in bilateral lower extremities and at least 4/5 strength in upper and lower core    Time 8   Period Weeks   Status New   PT LONG TERM GOAL #2   Title Patient to experience no more than 3/10 pain with all functional tasks and activities in sitting,  standing, or ambulating for unlimited periods of time    Time 8   Period Weeks   Status New   PT LONG TERM GOAL #3   Title Patient to be able to perform all functional transfers and transitions such as sit to stand and stand to sit with pain no more than 1/10   Time 8   Period Weeks   Status New   PT LONG TERM GOAL #4   Title Patient to report that he has been able to return to work at least on a part-time basis with pain no more than 3/10   Time 8   Period Weeks   Status New   PT LONG TERM GOAL #5   Title Patient to be consistently participating in light to moderate intensity exercise for at least 30 minutes, at least 3 days per week, in order to assist him in returning to prior level of function, maintaining functional gains, and promoting improved health habits.    Time 8   Period  Weeks   Status New               Plan - 02/22/15 1732    Clinical Impression Statement Patient presents with low back and hip pain, muscle weakness, postural and gait impairment, core weakness, and reduced functional task performance and ability to return to work status-post back surgery on 9/30. AT this time his doctor does not want him to perform bending, lifting, or twisting movements. Patient does experience pain during functional activities and tasks however is able to complete these tasks given adequate time. Minimal concern noted for balance today as evidenced by TUG score. Patient appears highly motivated to return to sports and physical activity, as well as participating in PT. At this time patient will benefit from skilled PT services in  order to address functional impairment and assist him in reaching an optimal level of function.    Pt will benefit from skilled therapeutic intervention in order to improve on the following deficits Abnormal gait;Hypomobility;Decreased strength;Pain;Decreased mobility;Difficulty walking;Improper body mechanics;Impaired flexibility;Postural dysfunction   Rehab Potential Good   PT Frequency 2x / week   PT Duration 8 weeks   PT Treatment/Interventions ADLs/Self Care Home Management;Cryotherapy;Moist Heat;Gait training;Stair training;Functional mobility training;Therapeutic activities;Therapeutic exercise;Balance training;Neuromuscular re-education;Patient/family education;Manual techniques   PT Next Visit Plan review HEP and goals; functional stretching, strengthening, core stabilization    PT Home Exercise Plan given    Consulted and Agree with Plan of Care Patient         Problem List Patient Active Problem List   Diagnosis Date Noted  . Melena 06/04/2014  . Reflux esophagitis 06/04/2014  . Gastritis and gastroduodenitis   . Throat fullness 04/15/2014  . LLQ pain   . Diverticulitis of colon   . Cerebral thrombosis with cerebral infarction ([place]) 03/21/2014  . Stroke ([place]) 03/21/2014  . Hypoxemia 03/21/2014  . Atypical chest pain 03/21/2014  . Essential hypertension 03/21/2014  . TIA (transient ischemic attack)   . HLD (hyperlipidemia)   . Thyroid activity decreased   . Colitis 11/20/2012  . Chest pain 11/28/2011  . CAD (coronary artery disease)   . Hip pain 08/04/2011  . Muscle weakness (generalized) 08/04/2011  . Difficulty in walking(719.7) 08/04/2011  . 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 High blood pressure 09/27/2010  . Poor circulation 09/27/2010  . Thyroid disease 09/27/2010  . Fainting 09/27/2010  . Generalized headaches 09/27/2010  . Hoarseness/sore throat 09/27/2010  . Wears glasses 09/27/2010  . Arthritis 09/27/2010  . Abdominal  pain, left lower quadrant 12/17/2008  . LIVER FUNCTION TESTS, ABNORMAL, HX OF 12/17/2008    Deniece Ree PT, DPT [facility] [address] [place], [place], [address] Phone: [phone]   Fax:  [phone]  Name: Andrew Love MRN: [id number] Date of Birth: [birth year]

## 2015-02-24 ENCOUNTER — Ambulatory Visit (HOSPITAL_COMMUNITY): Payer: Commercial Managed Care - HMO | Admitting: Physical Therapy

## 2015-02-24 DIAGNOSIS — M6281 Muscle weakness (generalized): Secondary | ICD-10-CM

## 2015-02-24 DIAGNOSIS — M545 Low back pain, unspecified: Secondary | ICD-10-CM

## 2015-02-24 DIAGNOSIS — M25552 Pain in left hip: Secondary | ICD-10-CM

## 2015-02-24 DIAGNOSIS — R262 Difficulty in walking, not elsewhere classified: Secondary | ICD-10-CM

## 2015-02-24 DIAGNOSIS — R293 Abnormal posture: Secondary | ICD-10-CM

## 2015-02-24 NOTE — Therapy (Signed)
[place], [place], [address] Phone: [phone]   Fax:  [phone]  Physical Therapy Treatment  Patient Details  Name: Andrew Love MRN: [id number] Date of Birth: [birth year] Referring Provider: Kary Kos, MD   Encounter Date: 02/24/2015      PT End of Session - 02/24/15 1349    Visit Number 2   Number of Visits 16   Date for PT Re-Evaluation 03/22/15   Authorization Type UHC    Authorization Time Period 02/22/15 to 04/24/15   PT Start Time 1300   PT Stop Time 1345   PT Time Calculation (min) 45 min   Activity Tolerance Patient tolerated treatment well   Behavior During Therapy [facility] for tasks assessed/performed      Past Medical History  Diagnosis Date  . CAD (coronary artery disease)   . Elevated liver enzymes   . Diverticulitis     Hx of; requiring 3 admissions  . Sigmoid colon ulcer     Rectal polyps  . Thyroid disease   . Psoriasis     Per medical history form dated 06/13/10.  . Chronic insomnia     Per medical history form dated 06/13/10.  . Colitis     Per medical history from dated 06/13/10.  Marland Kitchen Hypertension   . Hypothyroidism   . Bronchitis     history of  . Back pain     chronic  . Arthritis   . 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 Sleep apnea with use of continuous positive airway pressure (CPAP)     Past Surgical History  Procedure Laterality Date  . Colonoscopy  07/2008    Colitis,NSAID v. Ischemia,malrotation of the gut,Diverticulosis(L),hyperplastic  . Appendectomy      8th grade  . Shoulder surgery      Left  . Rotator cuff repair      Left - per medical history form dated 06/13/10.  . Thyroidectomy      Per medical history form dated 06/13/10.  . Laparoscopic sigmoid colectomy  2012    Dr. Fanny Skates: recurrent sigmoid diverticulitis  . Total hip arthroplasty  07/04/2011    Procedure: TOTAL HIP ARTHROPLASTY;  Surgeon: Johnny Bridge, MD;   Location: [place];  Service: Orthopedics;  Laterality: Right;  . Joint replacement    . Cardiac catheterization  2009  . Cardiac catheterization      Per medical history from dated 06/13/10.  . Colonoscopy N/A 12/13/2012    Dr. Oneida Alar: Normal TI, mild sigmoid diverticulosis, hemorrhoids, 2 polyps (tubular adenoma). Random colon bx negative. Next TCS 12/2022 with Fentanyl/phenergan  . Esophagogastroduodenoscopy N/A 04/16/2014    RMR: Erosive reflux esophagitis. Non critical Schzki's ring not manipulated. Small hiatal hernia. Abnormal gastirc mucosa of doubtful signigicance status post biopsy. I suspect trivial upper GI bleed. Recent abdominal pain presumably secondary to a protracted bout of diverticulitis. CT scan November 23 revealed improvemetn without complication. He finished his antibiotics 2 days age. Left sided ab    There were no vitals filed for this visit.  Visit Diagnosis:  Left-sided low back pain without sciatica  Muscle weakness  Poor posture  Difficulty walking  Left hip pain      Subjective Assessment - 02/24/15 1311    Subjective PT states he has been driving for 45 minutes and got his back flared up.  Currently with 7/10 pain in Lt hip and lumbar region.     Currently in Pain?  Yes   Pain Score 7    Pain Location Back   Pain Orientation Left;Lower                         [facility] Adult PT Treatment/Exercise - 02/24/15 1319    Lumbar Exercises: Stretches   Active Hamstring Stretch 3 reps;30 seconds   Active Hamstring Stretch Limitations 12' step   Passive Hamstring Stretch 3 reps;30 seconds   Passive Hamstring Stretch Limitations gastroc slant board stretch   Lower Trunk Rotation 5 reps;10 seconds   Piriformis Stretch 3 reps;30 seconds   Piriformis Stretch Limitations seated bilaterally   Lumbar Exercises: Standing   Heel Raises 10 reps   Lumbar Exercises: Supine   Bridge 10 reps   Straight Leg Raise 10 reps   Isometric Hip Flexion 5 reps;5  seconds                PT Education - 02/24/15 1349    Education provided Yes   Education Details initial evaluation and reveiw of HEP given   Person(s) Educated Patient   Methods Explanation;Demonstration;Handout   Comprehension Verbalized understanding;Returned demonstration          PT Short Term Goals - 02/22/15 1736    PT SHORT TERM GOAL #1   Title Patient to experience no more than 5/10 pain during all functional tasks and activities in sitting or standing of at least 60 minutes duration    Time 4   Period Weeks   Status New   PT SHORT TERM GOAL #2   Title Patient to be able to verbalize the importance of good posture and will be able to maintain good posture throughout all functional tasksk with cues no more than 20% of the time    Time 4   Period Weeks   Status New   PT SHORT TERM GOAL #3   Title Patient to be able to perform supine to sit/sit to supine and sit to stand/stand to sit transitions with pain no more than 3/10, good sequencing and mechanics throughout    Time 4   Period Weeks   Status New   PT SHORT TERM GOAL #4   Title Patient to demonstrate L hip range improvement of internal and external rotation by at least 10 degrees in order to reduce pain and improve overall functional mechanics    Time 4   Period Weeks   Status New   PT SHORT TERM GOAL #5   Title Patient to be independent in correctly and consistently performing appropriate HEP, to be updated PRN    Time 4   Period Weeks   Status New           PT Long Term Goals - 02/22/15 1738    PT LONG TERM GOAL #1   Title Patient to demonstrate 5/5 strength in bilateral lower extremities and at least 4/5 strength in upper and lower core    Time 8   Period Weeks   Status New   PT LONG TERM GOAL #2   Title Patient to experience no more than 3/10 pain with all functional tasks and activities in sitting,  standing, or ambulating for unlimited periods of time    Time 8   Period Weeks   Status  New   PT LONG TERM GOAL #3   Title Patient to be able to perform all functional transfers and transitions such as sit to stand and stand to sit with pain no  more than 1/10   Time 8   Period Weeks   Status New   PT LONG TERM GOAL #4   Title Patient to report that he has been able to return to work at least on a part-time basis with pain no more than 3/10   Time 8   Period Weeks   Status New   PT LONG TERM GOAL #5   Title Patient to be consistently participating in light to moderate intensity exercise for at least 30 minutes, at least 3 days per week, in order to assist him in returning to prior level of function, maintaining functional gains, and promoting improved health habits.    Time 8   Period Weeks   Status New               Plan - 02/24/15 1350    Clinical Impression Statement Reviewed HEP and initial evaluation with explanation of measurements and goals. Progressed wtih standing functinonal strengthening and stretchiing without c/o pain, only stiffness and cues for form.    PT Duration 8 weeks   PT Next Visit Plan Progress functional stretching, strengthening, core stabilization    Consulted and Agree with Plan of Care Patient        Problem List Patient Active Problem List   Diagnosis Date Noted  . Melena 06/04/2014  . Reflux esophagitis 06/04/2014  . Gastritis and gastroduodenitis   . Throat fullness 04/15/2014  . LLQ pain   . Diverticulitis of colon   . Cerebral thrombosis with cerebral infarction ([place]) 03/21/2014  . Stroke ([place]) 03/21/2014  . Hypoxemia 03/21/2014  . Atypical chest pain 03/21/2014  . Essential hypertension 03/21/2014  . TIA (transient ischemic attack)   . HLD (hyperlipidemia)   . Thyroid activity decreased   . Colitis 11/20/2012  . Chest pain 11/28/2011  . CAD (coronary artery disease)   . Hip pain 08/04/2011  . Muscle weakness (generalized) 08/04/2011  . Difficulty in walking(719.7) 08/04/2011  . Osteoarthritis resulting from  right hip dysplasia 07/04/2011  . High blood pressure 09/27/2010  . Poor circulation 09/27/2010  . Thyroid disease 09/27/2010  . Fainting 09/27/2010  . Generalized headaches 09/27/2010  . Hoarseness/sore throat 09/27/2010  . Wears glasses 09/27/2010  . Arthritis 09/27/2010  . Abdominal pain, left lower quadrant 12/17/2008  . LIVER FUNCTION TESTS, ABNORMAL, HX OF 12/17/2008    Teena Irani, PTA/CLT [phone]  02/24/2015, 1:52 PM  [facility] [address] [place], [place], [address] Phone: [phone]   Fax:  [phone]  Name: UZZIEL RUSSEY MRN: [id number] Date of Birth: 05/12/1965

## 2015-03-01 ENCOUNTER — Ambulatory Visit (HOSPITAL_COMMUNITY): Payer: Commercial Managed Care - HMO | Admitting: Physical Therapy

## 2015-03-01 DIAGNOSIS — M545 Low back pain, unspecified: Secondary | ICD-10-CM

## 2015-03-01 DIAGNOSIS — M6281 Muscle weakness (generalized): Secondary | ICD-10-CM

## 2015-03-01 DIAGNOSIS — R293 Abnormal posture: Secondary | ICD-10-CM

## 2015-03-01 DIAGNOSIS — M25552 Pain in left hip: Secondary | ICD-10-CM

## 2015-03-01 DIAGNOSIS — R262 Difficulty in walking, not elsewhere classified: Secondary | ICD-10-CM

## 2015-03-01 NOTE — Therapy (Signed)
[place], [place], [address] Phone: [phone]   Fax:  [phone]  Physical Therapy Treatment  Patient Details  Name: Andrew Love MRN: [id number] Date of Birth: 24-Feb-1966 Referring Provider: Kary Kos, MD   Encounter Date: 03/01/2015      PT End of Session - 03/01/15 1536    Visit Number 3   Number of Visits 16   Date for PT Re-Evaluation 03/22/15   Authorization Type UHC    Authorization Time Period 02/22/15 to 04/24/15   PT Start Time 1350   PT Stop Time 1430   PT Time Calculation (min) 40 min   Activity Tolerance Patient tolerated treatment well      Past Medical History  Diagnosis Date  . CAD (coronary artery disease)   . Elevated liver enzymes   . Diverticulitis     Hx of; requiring 3 admissions  . Sigmoid colon ulcer     Rectal polyps  . Thyroid disease   . Psoriasis     Per medical history form dated 06/13/10.  . Chronic insomnia     Per medical history form dated 06/13/10.  . Colitis     Per medical history from dated 06/13/10.  Marland Kitchen Hypertension   . Hypothyroidism   . Bronchitis     history of  . Back pain     chronic  . Arthritis   . 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 Sleep apnea with use of continuous positive airway pressure (CPAP)     Past Surgical History  Procedure Laterality Date  . Colonoscopy  07/2008    Colitis,NSAID v. Ischemia,malrotation of the gut,Diverticulosis(L),hyperplastic  . Appendectomy      8th grade  . Shoulder surgery      Left  . Rotator cuff repair      Left - per medical history form dated 06/13/10.  . Thyroidectomy      Per medical history form dated 06/13/10.  . Laparoscopic sigmoid colectomy  2012    Dr. Fanny Skates: recurrent sigmoid diverticulitis  . Total hip arthroplasty  07/04/2011    Procedure: TOTAL HIP ARTHROPLASTY;  Surgeon: Johnny Bridge, MD;  Location: [facility];  Service: Orthopedics;  Laterality: Right;  .  Joint replacement    . Cardiac catheterization  2009  . Cardiac catheterization      Per medical history from dated 06/13/10.  . Colonoscopy N/A 12/13/2012    Dr. Oneida Alar: Normal TI, mild sigmoid diverticulosis, hemorrhoids, 2 polyps (tubular adenoma). Random colon bx negative. Next TCS 12/2022 with Fentanyl/phenergan  . Esophagogastroduodenoscopy N/A 04/16/2014    RMR: Erosive reflux esophagitis. Non critical Schzki's ring not manipulated. Small hiatal hernia. Abnormal gastirc mucosa of doubtful signigicance status post biopsy. I suspect trivial upper GI bleed. Recent abdominal pain presumably secondary to a protracted bout of diverticulitis. CT scan November 23 revealed improvemetn without complication. He finished his antibiotics 2 days age. Left sided ab    There were no vitals filed for this visit.  Visit Diagnosis:  Left-sided low back pain without sciatica  Muscle weakness  Poor posture  Difficulty walking  Left hip pain      Subjective Assessment - 03/01/15 1542    Subjective PT states he had a bad weekend;  States his pain went up to a 9/10 yesterday.     Pertinent History Patient has had back pain ever since he can remember, but in June he picked up and moved a  plastic table after which his new course of pain started. Went to a variety of doctors, got referred eventually to Dr. Saintclair Halsted. He went through shots for his back, tried a Restaurant manager, fast food, tried therapy but was in too much pain, and failed all conservative treatmetns. Surgery was done September 30th with Dr. Saintclair Halsted,.   How long can you sit comfortably? 5 minutes    How long can you stand comfortably? 5 minutes    Patient Stated Goals to be active again, pain free    Currently in Pain? Yes   Pain Score 8    Pain Location Back   Pain Orientation Left;Right;Lower   Pain Descriptors / Indicators Aching;Constant               [facility] Adult PT Treatment/Exercise - 03/01/15 0001    Lumbar Exercises: Stretches   Active  Hamstring Stretch 3 reps;30 seconds   Active Hamstring Stretch Limitations supine   Lower Trunk Rotation 5 reps   Prone on Elbows Stretch 5 reps   Prone on Elbows Stretch Limitations pulling up with back mm to get to POE hold and melt out.    Lumbar Exercises: Standing   Heel Raises 10 reps   Functional Squats 10 reps   Other Standing Lumbar Exercises 3 -D hip excursion x 3    Lumbar Exercises: Supine   Ab Set 10 reps   Bridge 10 reps   Other Supine Lumbar Exercises decompression ex 1-5   Lumbar Exercises: Prone   Straight Leg Raise 10 reps   Opposite Arm/Leg Raise --   Other Prone Lumbar Exercises glut set; heel squeeze x 10    Lumbar Exercises: Quadruped   Madcat/Old Horse 5 reps                PT Education - 03/01/15 1536    Education provided Yes   Education Details given decompression exercises and prone exercises    Person(s) Educated Patient   Methods Explanation;Demonstration;Handout   Comprehension Returned demonstration;Verbalized understanding          PT Short Term Goals - 03/01/15 1539    PT SHORT TERM GOAL #1   Title Patient to experience no more than 5/10 pain during all functional tasks and activities in sitting or standing of at least 60 minutes duration    Time 4   Period Weeks   Status On-going   PT SHORT TERM GOAL #2   Title Patient to be able to verbalize the importance of good posture and will be able to maintain good posture throughout all functional tasksk with cues no more than 20% of the time    Time 4   Period Weeks   Status On-going   PT SHORT TERM GOAL #3   Title Patient to be able to perform supine to sit/sit to supine and sit to stand/stand to sit transitions with pain no more than 3/10, good sequencing and mechanics throughout    Time 4   Period Weeks   Status On-going   PT SHORT TERM GOAL #4   Title Patient to demonstrate L hip range improvement of internal and external rotation by at least 10 degrees in order to reduce pain  and improve overall functional mechanics    Time 4   Period Weeks   Status On-going   PT SHORT TERM GOAL #5   Title Patient to be independent in correctly and consistently performing appropriate HEP, to be updated PRN    Time 4   Period Weeks  Status On-going           PT Long Term Goals - 03/01/15 1540    PT LONG TERM GOAL #1   Title Patient to demonstrate 5/5 strength in bilateral lower extremities and at least 4/5 strength in upper and lower core    Time 8   Period Weeks   Status On-going   PT LONG TERM GOAL #2   Title Patient to experience no more than 3/10 pain with all functional tasks and activities in sitting,  standing, or ambulating for unlimited periods of time    Time 8   Period Weeks   Status On-going   PT LONG TERM GOAL #3   Title Patient to be able to perform all functional transfers and transitions such as sit to stand and stand to sit with pain no more than 1/10   Time 8   Period Weeks   Status On-going   PT LONG TERM GOAL #4   Title Patient to report that he has been able to return to work at least on a part-time basis with pain no more than 3/10   Time 8   Period Weeks   Status On-going   PT LONG TERM GOAL #5   Title Patient to be consistently participating in light to moderate intensity exercise for at least 30 minutes, at least 3 days per week, in order to assist him in returning to prior level of function, maintaining functional gains, and promoting improved health habits.    Time 8   Period Weeks   Status On-going               Plan - 03/01/15 1536    Clinical Impression Statement Pt instructed in supine decompressvie exercises; prone exercises to decrease pain and improve stability of back.  Pt able to comoplete new exerecises in good form wint minimal verbal cuing.  Pt verbalizes pain decreased to a 5-6/10 after treatment.    PT Treatment/Interventions ADLs/Self Care Home Management;Cryotherapy;Moist Heat;Gait training;Stair  training;Functional mobility training;Therapeutic activities;Therapeutic exercise;Balance training;Neuromuscular re-education;Patient/family education;Manual techniques   PT Next Visit Plan begin opposite arm/leg while prone and continue with streching.         Problem List Patient Active Problem List   Diagnosis Date Noted  . Melena 06/04/2014  . Reflux esophagitis 06/04/2014  . Gastritis and gastroduodenitis   . Throat fullness 04/15/2014  . LLQ pain   . Diverticulitis of colon   . Cerebral thrombosis with cerebral infarction ([facility]) 03/21/2014  . Stroke ([facility]) 03/21/2014  . Hypoxemia 03/21/2014  . Atypical chest pain 03/21/2014  . Essential hypertension 03/21/2014  . TIA (transient ischemic attack)   . HLD (hyperlipidemia)   . Thyroid activity decreased   . Colitis 11/20/2012  . Chest pain 11/28/2011  . CAD (coronary artery disease)   . Hip pain 08/04/2011  . Muscle weakness (generalized) 08/04/2011  . Difficulty in walking(719.7) 08/04/2011  . 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 High blood pressure 09/27/2010  . Poor circulation 09/27/2010  . Thyroid disease 09/27/2010  . Fainting 09/27/2010  . Generalized headaches 09/27/2010  . Hoarseness/sore throat 09/27/2010  . Wears glasses 09/27/2010  . Arthritis 09/27/2010  . Abdominal pain, left lower quadrant 12/17/2008  . LIVER FUNCTION TESTS, ABNORMAL, HX OF 12/17/2008   Rayetta Humphrey, PT CLT [phone] 03/01/2015, 3:43 PM  [facility] [address] [place], [place], [address] Phone: [phone]   Fax:  [phone]  Name: Andrew Carrero  Love MRN: [id number] Date of Birth: [birth year]

## 2015-03-01 NOTE — Patient Instructions (Signed)
Angry Cat Stretch    Tuck chin and tighten stomach, arching back. Repeat __5__ times per set. Do ____ sets per session. Do __2__ sessions per day. 1 http://orth.exer.us/118   Copyright  VHI. All rights reserved.  On Elbows (Prone)    Rise up on  as high as possible, keeping hips on floor. Hold __5__ seconds. Repeat __10__ times per set. Do _1___ sets per session. Do __2__ sessions per day.  http://orth.exer.us/92   Copyright  VHI. All rights reserved.  Gluteal Sets    Tighten buttocks while pressing pelvis to floor. Hold _2___ seconds. Repeat __10__ times per set. Do __1__ sets per session. Do ___3_ sessions per day.  http://orth.exer.us/104   Copyright  VHI. All rights reserved.  Straight Leg Raise (Prone)    Abdomen and head supported, keep left knee locked and raise leg at hip. Avoid arching low back. Repeat _10___ times per set. Do __1__ sets per session. Do _3___ sessions per day.  http://orth.exer.us/1112   Copyright  VHI. All rights reserved.

## 2015-03-03 ENCOUNTER — Ambulatory Visit (HOSPITAL_COMMUNITY): Payer: Commercial Managed Care - HMO | Admitting: Physical Therapy

## 2015-03-03 DIAGNOSIS — M6281 Muscle weakness (generalized): Secondary | ICD-10-CM

## 2015-03-03 DIAGNOSIS — M545 Low back pain, unspecified: Secondary | ICD-10-CM

## 2015-03-03 DIAGNOSIS — M25552 Pain in left hip: Secondary | ICD-10-CM

## 2015-03-03 DIAGNOSIS — R293 Abnormal posture: Secondary | ICD-10-CM

## 2015-03-03 DIAGNOSIS — R262 Difficulty in walking, not elsewhere classified: Secondary | ICD-10-CM

## 2015-03-03 NOTE — Therapy (Signed)
[place], [place], [address] Phone: [phone]   Fax:  [phone]  Physical Therapy Treatment  Patient Details  Name: Andrew Love MRN: [id number] Date of Birth: February 28, 1966 Referring Provider: Kary Kos, MD   Encounter Date: 03/03/2015      PT End of Session - 03/03/15 1622    Visit Number 4   Number of Visits 16   Date for PT Re-Evaluation 03/22/15   Authorization Type UHC    Authorization Time Period 02/22/15 to 04/24/15   PT Start Time 1347   PT Stop Time 1428   PT Time Calculation (min) 41 min   Activity Tolerance Patient tolerated treatment well   Behavior During Therapy [facility] for tasks assessed/performed      Past Medical History  Diagnosis Date  . CAD (coronary artery disease)   . Elevated liver enzymes   . Diverticulitis     Hx of; requiring 3 admissions  . Sigmoid colon ulcer     Rectal polyps  . Thyroid disease   . Psoriasis     Per medical history form dated 06/13/10.  . Chronic insomnia     Per medical history form dated 06/13/10.  . Colitis     Per medical history from dated 06/13/10.  Marland Kitchen Hypertension   . Hypothyroidism   . Bronchitis     history of  . Back pain     chronic  . Arthritis   . 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 Sleep apnea with use of continuous positive airway pressure (CPAP)     Past Surgical History  Procedure Laterality Date  . Colonoscopy  07/2008    Colitis,NSAID v. Ischemia,malrotation of the gut,Diverticulosis(L),hyperplastic  . Appendectomy      8th grade  . Shoulder surgery      Left  . Rotator cuff repair      Left - per medical history form dated 06/13/10.  . Thyroidectomy      Per medical history form dated 06/13/10.  . Laparoscopic sigmoid colectomy  2012    Dr. Fanny Skates: recurrent sigmoid diverticulitis  . Total hip arthroplasty  07/04/2011    Procedure: TOTAL HIP ARTHROPLASTY;  Surgeon: Johnny Bridge, MD;   Location: [facility];  Service: Orthopedics;  Laterality: Right;  . Joint replacement    . Cardiac catheterization  2009  . Cardiac catheterization      Per medical history from dated 06/13/10.  . Colonoscopy N/A 12/13/2012    Dr. Oneida Alar: Normal TI, mild sigmoid diverticulosis, hemorrhoids, 2 polyps (tubular adenoma). Random colon bx negative. Next TCS 12/2022 with Fentanyl/phenergan  . Esophagogastroduodenoscopy N/A 04/16/2014    RMR: Erosive reflux esophagitis. Non critical Schzki's ring not manipulated. Small hiatal hernia. Abnormal gastirc mucosa of doubtful signigicance status post biopsy. I suspect trivial upper GI bleed. Recent abdominal pain presumably secondary to a protracted bout of diverticulitis. CT scan November 23 revealed improvemetn without complication. He finished his antibiotics 2 days age. Left sided ab    There were no vitals filed for this visit.  Visit Diagnosis:  Left-sided low back pain without sciatica  Muscle weakness  Poor posture  Difficulty walking  Left hip pain      Subjective Assessment - 03/03/15 1348    Subjective Patient reports that he felt good after last session until he got home and got stiff again; reports that he starts to hurt more after he has been still for awhile  Pertinent History Patient has had back pain ever since he can remember, but in June he picked up and moved a plastic table after which his new course of pain started. Went to a variety of doctors, got referred eventually to Dr. Saintclair Halsted. He went through shots for his back, tried a Restaurant manager, fast food, tried therapy but was in too much pain, and failed all conservative treatmetns. Surgery was done September 30th with Dr. Saintclair Halsted,.   Currently in Pain? Yes   Pain Score 5    Pain Location Back   Pain Orientation Right;Left;Lower                         [facility] Adult PT Treatment/Exercise - 03/03/15 0001    Lumbar Exercises: Stretches   Active Hamstring Stretch 3 reps;30 seconds    Active Hamstring Stretch Limitations 12 inch step    Passive Hamstring Stretch 3 reps;30 seconds   Passive Hamstring Stretch Limitations gastroc slant board stretch   Lower Trunk Rotation 5 reps;20 seconds   Prone on Elbows Stretch 5 reps;30 seconds   Lumbar Exercises: Standing   Other Standing Lumbar Exercises 3D hip excursions 1x10   Lumbar Exercises: Supine   Ab Set 20 reps   AB Set Limitations 3 second holds    Bridge 10 reps   Straight Leg Raise 10 reps   Other Supine Lumbar Exercises dead bugs 1x10 each side   isometric with L leg due to hip pain with flexion   Lumbar Exercises: Sidelying   Clam 10 reps   Lumbar Exercises: Quadruped   Madcat/Old Horse 10 reps                PT Education - 03/03/15 1622    Education provided No          PT Short Term Goals - 03/01/15 1539    PT SHORT TERM GOAL #1   Title Patient to experience no more than 5/10 pain during all functional tasks and activities in sitting or standing of at least 60 minutes duration    Time 4   Period Weeks   Status On-going   PT SHORT TERM GOAL #2   Title Patient to be able to verbalize the importance of good posture and will be able to maintain good posture throughout all functional tasksk with cues no more than 20% of the time    Time 4   Period Weeks   Status On-going   PT SHORT TERM GOAL #3   Title Patient to be able to perform supine to sit/sit to supine and sit to stand/stand to sit transitions with pain no more than 3/10, good sequencing and mechanics throughout    Time 4   Period Weeks   Status On-going   PT SHORT TERM GOAL #4   Title Patient to demonstrate L hip range improvement of internal and external rotation by at least 10 degrees in order to reduce pain and improve overall functional mechanics    Time 4   Period Weeks   Status On-going   PT SHORT TERM GOAL #5   Title Patient to be independent in correctly and consistently performing appropriate HEP, to be updated PRN    Time  4   Period Weeks   Status On-going           PT Long Term Goals - 03/01/15 1540    PT LONG TERM GOAL #1   Title Patient to demonstrate 5/5 strength in bilateral lower  extremities and at least 4/5 strength in upper and lower core    Time 8   Period Weeks   Status On-going   PT LONG TERM GOAL #2   Title Patient to experience no more than 3/10 pain with all functional tasks and activities in sitting,  standing, or ambulating for unlimited periods of time    Time 8   Period Weeks   Status On-going   PT LONG TERM GOAL #3   Title Patient to be able to perform all functional transfers and transitions such as sit to stand and stand to sit with pain no more than 1/10   Time 8   Period Weeks   Status On-going   PT LONG TERM GOAL #4   Title Patient to report that he has been able to return to work at least on a part-time basis with pain no more than 3/10   Time 8   Period Weeks   Status On-going   PT LONG TERM GOAL #5   Title Patient to be consistently participating in light to moderate intensity exercise for at least 30 minutes, at least 3 days per week, in order to assist him in returning to prior level of function, maintaining functional gains, and promoting improved health habits.    Time 8   Period Weeks   Status On-going               Plan - 03/03/15 1623    Clinical Impression Statement Continued functional exercises and stretches today. Difficulty with tasks such as straight leg lift and even supine dead bugs caused significant left hip pain with flexion even with isometrics, so these activities were done with caution. Pain unchanged at end of session.     Pt will benefit from skilled therapeutic intervention in order to improve on the following deficits Abnormal gait;Hypomobility;Decreased strength;Pain;Decreased mobility;Difficulty walking;Improper body mechanics;Impaired flexibility;Postural dysfunction   Rehab Potential Good   PT Frequency 2x / week   PT Duration 8  weeks   PT Treatment/Interventions ADLs/Self Care Home Management;Cryotherapy;Moist Heat;Gait training;Stair training;Functional mobility training;Therapeutic activities;Therapeutic exercise;Balance training;Neuromuscular re-education;Patient/family education;Manual techniques   PT Next Visit Plan begin opposite arm/leg while prone and continue with streching.    PT Home Exercise Plan given    Consulted and Agree with Plan of Care Patient        Problem List Patient Active Problem List   Diagnosis Date Noted  . Melena 06/04/2014  . Reflux esophagitis 06/04/2014  . Gastritis and gastroduodenitis   . Throat fullness 04/15/2014  . LLQ pain   . Diverticulitis of colon   . Cerebral thrombosis with cerebral infarction ([facility]) 03/21/2014  . Stroke ([facility]) 03/21/2014  . Hypoxemia 03/21/2014  . Atypical chest pain 03/21/2014  . Essential hypertension 03/21/2014  . TIA (transient ischemic attack)   . HLD (hyperlipidemia)   . Thyroid activity decreased   . Colitis 11/20/2012  . Chest pain 11/28/2011  . CAD (coronary artery disease)   . Hip pain 08/04/2011  . Muscle weakness (generalized) 08/04/2011  . Difficulty in walking(719.7) 08/04/2011  . 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 High blood pressure 09/27/2010  . Poor circulation 09/27/2010  . Thyroid disease 09/27/2010  . Fainting 09/27/2010  . Generalized headaches 09/27/2010  . Hoarseness/sore throat 09/27/2010  . Wears glasses 09/27/2010  . Arthritis 09/27/2010  . Abdominal pain, left lower quadrant 12/17/2008  . LIVER FUNCTION TESTS, ABNORMAL, HX OF 12/17/2008    Deniece Ree PT, DPT [phone]  [facility], [place], [address] Phone: [phone]   Fax:  [phone]  Name: KEELEN QUEVEDO MRN: [id number] Date of Birth: July 04, 1965

## 2015-03-08 ENCOUNTER — Ambulatory Visit (HOSPITAL_COMMUNITY): Payer: Commercial Managed Care - HMO | Admitting: Physical Therapy

## 2015-03-10 ENCOUNTER — Ambulatory Visit (HOSPITAL_COMMUNITY): Payer: Commercial Managed Care - HMO | Attending: Neurosurgery | Admitting: Physical Therapy

## 2015-03-10 DIAGNOSIS — M545 Low back pain, unspecified: Secondary | ICD-10-CM

## 2015-03-10 DIAGNOSIS — R293 Abnormal posture: Secondary | ICD-10-CM | POA: Diagnosis present

## 2015-03-10 DIAGNOSIS — R262 Difficulty in walking, not elsewhere classified: Secondary | ICD-10-CM | POA: Diagnosis present

## 2015-03-10 DIAGNOSIS — M6281 Muscle weakness (generalized): Secondary | ICD-10-CM | POA: Diagnosis present

## 2015-03-10 DIAGNOSIS — M25552 Pain in left hip: Secondary | ICD-10-CM | POA: Insufficient documentation

## 2015-03-10 NOTE — Therapy (Addendum)
[place], [place], [address] Phone: [phone]   Fax:  [phone]  Physical Therapy Treatment  Patient Details  Name: Andrew Love MRN: [id number] Date of Birth: [birth year] Referring Provider: Kary Kos, MD   Encounter Date: 03/10/2015      PT End of Session - 03/10/15 1350    Visit Number 5   Number of Visits 16   Date for PT Re-Evaluation 03/22/15   Authorization Type [employer]    Authorization Time Period 02/22/15 to 04/24/15   PT Start Time 1304   PT Stop Time 1355   PT Time Calculation (min) 51 min   Activity Tolerance Patient tolerated treatment well   Behavior During Therapy [facility] for tasks assessed/performed      Past Medical History  Diagnosis Date  . CAD (coronary artery disease)   . Elevated liver enzymes   . Diverticulitis     Hx of; requiring 3 admissions  . Sigmoid colon ulcer     Rectal polyps  . Thyroid disease   . Psoriasis     Per medical history form dated 06/13/10.  . Chronic insomnia     Per medical history form dated 06/13/10.  . Colitis     Per medical history from dated 06/13/10.  Marland Kitchen Hypertension   . Hypothyroidism   . Bronchitis     history of  . Back pain     chronic  . Arthritis   . 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 Sleep apnea with use of continuous positive airway pressure (CPAP)     Past Surgical History  Procedure Laterality Date  . Colonoscopy  07/2008    Colitis,NSAID v. Ischemia,malrotation of the gut,Diverticulosis(L),hyperplastic  . Appendectomy      8th grade  . Shoulder surgery      Left  . Rotator cuff repair      Left - per medical history form dated 06/13/10.  . Thyroidectomy      Per medical history form dated 06/13/10.  . Laparoscopic sigmoid colectomy  2012    Dr. Fanny Skates: recurrent sigmoid diverticulitis  . Total hip arthroplasty  07/04/2011    Procedure: TOTAL HIP ARTHROPLASTY;  Surgeon: Johnny Bridge, MD;   Location: [place];  Service: Orthopedics;  Laterality: Right;  . Joint replacement    . Cardiac catheterization  2009  . Cardiac catheterization      Per medical history from dated 06/13/10.  . Colonoscopy N/A 12/13/2012    Dr. Oneida Alar: Normal TI, mild sigmoid diverticulosis, hemorrhoids, 2 polyps (tubular adenoma). Random colon bx negative. Next TCS 12/2022 with Fentanyl/phenergan  . Esophagogastroduodenoscopy N/A 04/16/2014    RMR: Erosive reflux esophagitis. Non critical Schzki's ring not manipulated. Small hiatal hernia. Abnormal gastirc mucosa of doubtful signigicance status post biopsy. I suspect trivial upper GI bleed. Recent abdominal pain presumably secondary to a protracted bout of diverticulitis. CT scan November 23 revealed improvemetn without complication. He finished his antibiotics 2 days age. Left sided ab    There were no vitals filed for this visit.  Visit Diagnosis:  Left-sided low back pain without sciatica  Muscle weakness  Poor posture  Difficulty walking  Left hip pain      Subjective Assessment - 03/10/15 1313    Subjective PT states he didn't come last session due to stomach issues.  States hes been having most trouble in his Lt hip that has hurt ever since back surgery; only hurts with certain  movments and burns on inside of Lt. thigh down into calf region. States his back is hurting more today due to doing housework today of 8/10.   Currently in Pain? Yes   Pain Score 8    Pain Location Back   Pain Orientation Right;Left;Lower   Pain Descriptors / Indicators Aching;Constant                         [facility] Adult PT Treatment/Exercise - 03/10/15 1304    Lumbar Exercises: Stretches   Active Hamstring Stretch 3 reps;30 seconds   Active Hamstring Stretch Limitations 12 inch step    Passive Hamstring Stretch 3 reps;30 seconds   Passive Hamstring Stretch Limitations gastroc slant board stretch   Lumbar Exercises: Standing   Heel Raises 15 reps    Functional Squats 15 reps   Other Standing Lumbar Exercises 3D hip excursions 1x10   Lumbar Exercises: Supine   Bridge 15 reps   Straight Leg Raise 15 reps   Lumbar Exercises: Prone   Single Arm Raise 10 reps   Straight Leg Raise 10 reps   Other Prone Lumbar Exercises glut set; heel squeeze x 10    Lumbar Exercises: Quadruped   [place] Horse 10 reps                  PT Short Term Goals - 03/01/15 1539    PT SHORT TERM GOAL #1   Title Patient to experience no more than 5/10 pain during all functional tasks and activities in sitting or standing of at least 60 minutes duration    Time 4   Period Weeks   Status On-going   PT SHORT TERM GOAL #2   Title Patient to be able to verbalize the importance of good posture and will be able to maintain good posture throughout all functional tasksk with cues no more than 20% of the time    Time 4   Period Weeks   Status On-going   PT SHORT TERM GOAL #3   Title Patient to be able to perform supine to sit/sit to supine and sit to stand/stand to sit transitions with pain no more than 3/10, good sequencing and mechanics throughout    Time 4   Period Weeks   Status On-going   PT SHORT TERM GOAL #4   Title Patient to demonstrate L hip range improvement of internal and external rotation by at least 10 degrees in order to reduce pain and improve overall functional mechanics    Time 4   Period Weeks   Status On-going   PT SHORT TERM GOAL #5   Title Patient to be independent in correctly and consistently performing appropriate HEP, to be updated PRN    Time 4   Period Weeks   Status On-going           PT Long Term Goals - 03/01/15 1540    PT LONG TERM GOAL #1   Title Patient to demonstrate 5/5 strength in bilateral lower extremities and at least 4/5 strength in upper and lower core    Time 8   Period Weeks   Status On-going   PT LONG TERM GOAL #2   Title Patient to experience no more than 3/10 pain with all functional tasks  and activities in sitting,  standing, or ambulating for unlimited periods of time    Time 8   Period Weeks   Status On-going   PT LONG TERM GOAL #3  Title Patient to be able to perform all functional transfers and transitions such as sit to stand and stand to sit with pain no more than 1/10   Time 8   Period Weeks   Status On-going   PT LONG TERM GOAL #4   Title Patient to report that he has been able to return to work at least on a part-time basis with pain no more than 3/10   Time 8   Period Weeks   Status On-going   PT LONG TERM GOAL #5   Title Patient to be consistently participating in light to moderate intensity exercise for at least 30 minutes, at least 3 days per week, in order to assist him in returning to prior level of function, maintaining functional gains, and promoting improved health habits.    Time 8   Period Weeks   Status On-going               Plan - 03/10/15 1506    Clinical Impression Statement Straight leg raise continues to be difficult for patient due to pain/weakness.  PT able to complete all actviites, though with noted dyspnea and decreased functional actvitiy tolerance.  Able to progress reps of most actvities today.  Overall pain reduction and overall improved mobiltiy at end of session.    PT Next Visit Plan begin opposite arm/leg while prone and continue with streching.    PT Home Exercise Plan given    Consulted and Agree with Plan of Care Patient        Problem List Patient Active Problem List   Diagnosis Date Noted  . Melena 06/04/2014  . Reflux esophagitis 06/04/2014  . Gastritis and gastroduodenitis   . Throat fullness 04/15/2014  . LLQ pain   . Diverticulitis of colon   . Cerebral thrombosis with cerebral infarction ([place]) 03/21/2014  . Stroke ([place]) 03/21/2014  . Hypoxemia 03/21/2014  . Atypical chest pain 03/21/2014  . Essential hypertension 03/21/2014  . TIA (transient ischemic attack)   . HLD (hyperlipidemia)   . Thyroid  activity decreased   . Colitis 11/20/2012  . Chest pain 11/28/2011  . CAD (coronary artery disease)   . Hip pain 08/04/2011  . Muscle weakness (generalized) 08/04/2011  . Difficulty in walking(719.7) 08/04/2011  . 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 High blood pressure 09/27/2010  . Poor circulation 09/27/2010  . Thyroid disease 09/27/2010  . Fainting 09/27/2010  . Generalized headaches 09/27/2010  . Hoarseness/sore throat 09/27/2010  . Wears glasses 09/27/2010  . Arthritis 09/27/2010  . Abdominal pain, left lower quadrant 12/17/2008  . LIVER FUNCTION TESTS, ABNORMAL, HX OF 12/17/2008    Teena Irani, PTA/CLT [phone] 03/10/2015, 3:08 PM  [place], [place], [address] Phone: [phone]   Fax:  [phone]  Name: Andrew Love MRN: [id number] Date of Birth: 11-28-1965    Rayetta Humphrey, PT CLT [phone] PHYSICAL THERAPY DISCHARGE SUMMARY  Visits from Start of Care: 5  Current functional level related to goals / functional outcomes: See above   Remaining deficits: Pain limiting ADL's   Education / Equipment:Hep Plan: Patient agrees to discharge.  Patient goals were not met. Patient is being discharged due to not returning since the last visit.  ?????        Rayetta Humphrey, [place] CLT [phone]

## 2015-03-15 ENCOUNTER — Ambulatory Visit (HOSPITAL_COMMUNITY): Payer: Commercial Managed Care - HMO | Admitting: Physical Therapy

## 2015-03-15 ENCOUNTER — Telehealth (HOSPITAL_COMMUNITY): Payer: Self-pay | Admitting: Physical Therapy

## 2015-03-15 NOTE — Telephone Encounter (Signed)
Contacted patient regarding NS for appointment.  States he totally forgot but will be at next appointment on Wednesday

## 2015-03-17 ENCOUNTER — Ambulatory Visit (HOSPITAL_COMMUNITY): Payer: Commercial Managed Care - HMO

## 2015-03-17 NOTE — Telephone Encounter (Signed)
No show, called and spoke to pt. who stated he had already called and left message cancelling today's apt.  Pt had MRI yesterday and has MD apt. scheduled for tomorrow, MD wishes to hold PT until apt scheduled for 03/18/2015.  [address] [place]; CBIS [phone]

## 2015-03-22 ENCOUNTER — Ambulatory Visit (HOSPITAL_COMMUNITY): Payer: Commercial Managed Care - HMO | Admitting: Physical Therapy

## 2015-03-24 ENCOUNTER — Ambulatory Visit (HOSPITAL_COMMUNITY): Payer: Commercial Managed Care - HMO | Admitting: Physical Therapy

## 2015-03-29 ENCOUNTER — Encounter (HOSPITAL_COMMUNITY): Payer: Commercial Managed Care - HMO | Admitting: Physical Therapy

## 2015-03-31 ENCOUNTER — Encounter (HOSPITAL_COMMUNITY): Payer: Commercial Managed Care - HMO | Admitting: Physical Therapy

## 2015-04-05 ENCOUNTER — Encounter (HOSPITAL_COMMUNITY): Payer: Commercial Managed Care - HMO | Admitting: Physical Therapy

## 2015-04-06 ENCOUNTER — Other Ambulatory Visit: Payer: Self-pay | Admitting: Neurosurgery

## 2015-04-06 DIAGNOSIS — M25552 Pain in left hip: Secondary | ICD-10-CM

## 2015-04-07 ENCOUNTER — Encounter (HOSPITAL_COMMUNITY): Payer: Commercial Managed Care - HMO

## 2015-04-08 ENCOUNTER — Encounter: Payer: Self-pay | Admitting: Gastroenterology

## 2015-04-08 ENCOUNTER — Ambulatory Visit (INDEPENDENT_AMBULATORY_CARE_PROVIDER_SITE_OTHER): Payer: Commercial Managed Care - HMO | Admitting: Gastroenterology

## 2015-04-08 VITALS — BP 135/86 | HR 92 | Temp 98.4°F | Ht 73.0 in | Wt 240.4 lb

## 2015-04-08 DIAGNOSIS — G43A0 Cyclical vomiting, not intractable: Secondary | ICD-10-CM

## 2015-04-08 DIAGNOSIS — R1115 Cyclical vomiting syndrome unrelated to migraine: Secondary | ICD-10-CM

## 2015-04-08 DIAGNOSIS — R110 Nausea: Secondary | ICD-10-CM | POA: Insufficient documentation

## 2015-04-08 MED ORDER — ONDANSETRON HCL 4 MG PO TABS: ORAL_TABLET | ORAL | Status: DC

## 2015-04-08 NOTE — Patient Instructions (Signed)
AVOID FOOD THAT TRIGGERS REFLUX. SEE INFO BELOW.   CONTINUE YOUR WEIGHT LOSS EFFORTS. LOSE 10 lbs.  FOLLOW A LOW FAT DIET. SEE INFO BELOW.  TRY DEXILANT WITH YOUR FIRST MEAL. YOU CAN USE PROTONIX IN THE AFTERNOON IF NEEDED. CALL IF YOU WOULD LIKE TO SWITCH.  ZOFRAN AS NEEDED FOR NAUSEA OR VOMITING.  FOLLOW UP IN 3 MOS.    Lifestyle and home remedies TO MANAGE REFLUX/CHEST PAIN  You may eliminate or reduce the frequency of heartburn by making the following lifestyle changes:  . Control your weight. Being overweight is a major risk factor for heartburn and GERD. Excess pounds put pressure on your abdomen, pushing up your stomach and causing acid to back up into your esophagus.   . Eat smaller meals. 4 TO 6 MEALS A DAY. This reduces pressure on the lower esophageal sphincter, helping to prevent the valve from opening and acid from washing back into your esophagus.   Andrew Love your belt. Clothes that fit tightly around your waist put pressure on your abdomen and the lower esophageal sphincter.   . Eliminate heartburn triggers. Everyone has specific triggers. Common triggers such as fatty or fried foods, spicy food, tomato sauce, carbonated beverages, alcohol, chocolate, mint, garlic, onion, caffeine and nicotine may make heartburn worse.   Marland Kitchen Avoid stooping or bending. Tying your shoes is OK. Bending over for longer periods to weed your garden isn't, especially soon after eating.   . Don't lie down after a meal. Wait at least three to four hours after eating before going to bed, and don't lie down right after eating.   Marland Kitchen PUT THE HEAD OF YOUR BED ON 6 INCH BLOCKS.   Alternative medicine . Several home remedies exist for treating GERD, but they provide only temporary relief. They include drinking baking soda (sodium bicarbonate) added to water or drinking other fluids such as baking soda mixed with cream of tartar and water.  . Although these liquids create temporary relief by  neutralizing, washing away or buffering acids, eventually they aggravate the situation by adding gas and fluid to your stomach, increasing pressure and causing more acid reflux. Further, adding more sodium to your diet may increase your blood pressure and add stress to your heart, and excessive bicarbonate ingestion can alter the acid-base balance in your body.      Low-Fat Diet BREADS, CEREALS, PASTA, RICE, DRIED PEAS, AND BEANS These products are high in carbohydrates and most are low in fat. Therefore, they can be increased in the diet as substitutes for fatty foods. They too, however, contain calories and should not be eaten in excess. Cereals can be eaten for snacks as well as for breakfast.   FRUITS AND VEGETABLES It is good to eat fruits and vegetables. Besides being sources of fiber, both are rich in vitamins and some minerals. They help you get the daily allowances of these nutrients. Fruits and vegetables can be used for snacks and desserts.  MEATS Limit lean meat, chicken, [place], and fish to no more than 6 ounces per day. Beef, Pork, and Lamb Use lean cuts of beef, pork, and lamb. Lean cuts include:  Extra-lean ground beef.  Arm roast.  Sirloin tip.  Center-cut ham.  Round steak.  Loin chops.  Rump roast.  Tenderloin.  Trim all fat off the outside of meats before cooking. It is not necessary to severely decrease the intake of red meat, but lean choices should be made. Lean meat is rich in protein and contains  a highly absorbable form of iron. Premenopausal women, in particular, should avoid reducing lean red meat because this could increase the risk for low red blood cells (iron-deficiency anemia).  Chicken and [place] These are good sources of protein. The fat of poultry can be reduced by removing the skin and underlying fat layers before cooking. Chicken and [place] can be substituted for lean red meat in the diet. Poultry should not be fried or covered with high-fat  sauces. Fish and Shellfish Fish is a good source of protein. Shellfish contain cholesterol, but they usually are low in saturated fatty acids. The preparation of fish is important. Like chicken and [place], they should not be fried or covered with high-fat sauces. EGGS Egg whites contain no fat or cholesterol. They can be eaten often. Try 1 to 2 egg whites instead of whole eggs in recipes or use egg substitutes that do not contain yolk. MILK AND DAIRY PRODUCTS Use skim or 1% milk instead of 2% or whole milk. Decrease whole milk, natural, and processed cheeses. Use nonfat or low-fat (2%) cottage cheese or low-fat cheeses made from vegetable oils. Choose nonfat or low-fat (1 to 2%) yogurt. Experiment with evaporated skim milk in recipes that call for heavy cream. Substitute low-fat yogurt or low-fat cottage cheese for sour cream in dips and salad dressings. Have at least 2 servings of low-fat dairy products, such as 2 glasses of skim (or 1%) milk each day to help get your daily calcium intake. FATS AND OILS Reduce the total intake of fats, especially saturated fat. Butterfat, lard, and beef fats are high in saturated fat and cholesterol. These should be avoided as much as possible. Vegetable fats do not contain cholesterol, but certain vegetable fats, such as coconut oil, palm oil, and palm kernel oil are very high in saturated fats. These should be limited. These fats are often used in bakery goods, processed foods, popcorn, oils, and nondairy creamers. Vegetable shortenings and some peanut butters contain hydrogenated oils, which are also saturated fats. Read the labels on these foods and check for saturated vegetable oils. Unsaturated vegetable oils and fats do not raise blood cholesterol. However, they should be limited because they are fats and are high in calories. Total fat should still be limited to 30% of your daily caloric intake. Desirable liquid vegetable oils are corn oil, cottonseed oil, olive  oil, canola oil, safflower oil, soybean oil, and sunflower oil. Peanut oil is not as good, but small amounts are acceptable. Buy a heart-healthy tub margarine that has no partially hydrogenated oils in the ingredients. Mayonnaise and salad dressings often are made from unsaturated fats, but they should also be limited because of their high calorie and fat content. Seeds, nuts, peanut butter, olives, and avocados are high in fat, but the fat is mainly the unsaturated type. These foods should be limited mainly to avoid excess calories and fat. OTHER EATING TIPS Snacks  Most sweets should be limited as snacks. They tend to be rich in calories and fats, and their caloric content outweighs their nutritional value. Some good choices in snacks are graham crackers, melba toast, soda crackers, bagels (no egg), English muffins, fruits, and vegetables. These snacks are preferable to snack crackers, [place] fries, TORTILLA CHIPS, and POTATO chips. Popcorn should be air-popped or cooked in small amounts of liquid vegetable oil. Desserts Eat fruit, low-fat yogurt, and fruit ices instead of pastries, cake, and cookies. Sherbet, angel food cake, gelatin dessert, frozen low-fat yogurt, or other frozen products that do  not contain saturated fat (pure fruit juice bars, frozen ice pops) are also acceptable.  COOKING METHODS Choose those methods that use little or no fat. They include: Poaching.  Braising.  Steaming.  Grilling.  Baking.  Stir-frying.  Broiling.  Microwaving.  Foods can be cooked in a nonstick pan without added fat, or use a nonfat cooking spray in regular cookware. Limit fried foods and avoid frying in saturated fat. Add moisture to lean meats by using water, broth, cooking wines, and other nonfat or low-fat sauces along with the cooking methods mentioned above. Soups and stews should be chilled after cooking. The fat that forms on top after a few hours in the refrigerator should be skimmed off. When  preparing meals, avoid using excess salt. Salt can contribute to raising blood pressure in some people.  EATING AWAY FROM HOME Order entres, potatoes, and vegetables without sauces or butter. When meat exceeds the size of a deck of cards (3 to 4 ounces), the rest can be taken home for another meal. Choose vegetable or fruit salads and ask for low-calorie salad dressings to be served on the side. Use dressings sparingly. Limit high-fat toppings, such as bacon, crumbled eggs, cheese, sunflower seeds, and olives. Ask for heart-healthy tub margarine instead of butter.

## 2015-04-08 NOTE — Progress Notes (Signed)
   Subjective:    Patient ID: Andrew Love, male    DOB: 1965/06/27, 49 y.o.   MRN: [id number]  Andrew Love., MD  HPI TROUBLE WITH REFLUX BEING UNCONTROLLED SINCE SEP AFTER BACK SURGERY. BEFORE THAT PROTONIX WAS CONTROLLING HIS REFLUX. HEARTBURN NOW-EVERY DAY- MAINLY IN THE EVENING BEFORE HE HAS SYMPTOMS. HEARTBURN: BURNING IN CHEST. NAUSEA: EVERY DAY( STAYS ALL DAY) AFTER 3 PM REALLY ROUGH. VOMITING RARELY: 1-2X/MO, ASSOCIATED WITH CHILLS.Marland Kitchen ZOFRAN WHEN TAKING PERCOCET HELPED HIS NAUSEA. GAINED 10 LBS SINCE LAST YEAR. BMs: DIARRHEA OCCASIONALLY(1X/WEEK). PERCOCET CAUSED CONSTIPATION. STOLL GETTING MORE NORMAL NOW. HAD BAD EXPERIENCE FOR URGENT CARE. HAD SOME LEFT SIDED ABDOMINAL PAIN WHEN HE WAS CONSTIPATED BUT NONE LATELY.  PT DENIES FEVER, CHILLS, HEMATOCHEZIA, HEMATEMESIS, melena, SHORTNESS OF BREATH,  OR problems swallowing. Problems with sedation: NO   Past Medical History  Diagnosis Date  . CAD (coronary artery disease)   . Elevated liver enzymes   . Diverticulitis     Hx of; requiring 3 admissions  . Sigmoid colon ulcer     Rectal polyps  . Thyroid disease   . Psoriasis     Per medical history form dated 06/13/10.  . Chronic insomnia     Per medical history form dated 06/13/10.  . Colitis     Per medical history from dated 06/13/10.  Marland Kitchen Hypertension   . Hypothyroidism   . Bronchitis     history of  . Back pain     chronic  . Arthritis   . 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 Sleep apnea with use of continuous positive airway pressure (CPAP)    Past Surgical History  Procedure Laterality Date  . Colonoscopy  07/2008    Colitis,NSAID v. Ischemia,malrotation of the gut,Diverticulosis(L),hyperplastic  . Appendectomy      8th grade  . Shoulder surgery      Left  . Rotator cuff repair      Left - per medical history form dated 06/13/10.  . Thyroidectomy      Per medical history form dated 06/13/10.  . Laparoscopic sigmoid colectomy  2012    Dr.  Fanny Skates: recurrent sigmoid diverticulitis  . Total hip arthroplasty  07/04/2011    Procedure: TOTAL HIP ARTHROPLASTY;  Surgeon: Johnny Bridge, MD;  Location: [facility];  Service: Orthopedics;  Laterality: Right;  . Joint replacement    . Cardiac catheterization  2009  . Cardiac catheterization      Per medical history from dated 06/13/10.  . Colonoscopy N/A 12/13/2012    Dr. Oneida Alar: Normal TI, mild sigmoid diverticulosis, hemorrhoids, 2 polyps (tubular adenoma). Random colon bx negative. Next TCS 12/2022 with Fentanyl/phenergan  . Esophagogastroduodenoscopy N/A 04/16/2014    RMR: Erosive reflux esophagitis. Non critical Schzki's ring not manipulated. Small hiatal hernia. Abnormal gastirc mucosa of doubtful signigicance status post biopsy. I suspect trivial upper GI bleed. Recent abdominal pain presumably secondary to a protracted bout of diverticulitis. CT scan November 23 revealed improvemetn without complication. He finished his antibiotics 2 days age. Left sided ab   Review of Systems PER HPI OTHERWISE ALL SYSTEMS ARE NEGATIVE.    Objective:   Physical Exam        Assessment & Plan:

## 2015-04-08 NOTE — Assessment & Plan Note (Signed)
HFP NL DEC 2015.  CONTINUE TO MONITOR SYMPTOMS.

## 2015-04-08 NOTE — Assessment & Plan Note (Addendum)
ASSOCIATED WITH WEIGHT GAIN AND UNCONTROLLED GERD. SYMPTOMS NOT CONTROLLED AND DUE TO DIETARY/LIESTYLE FACTORS.  AVOID FOOD THAT TRIGGERS REFLUX.  HANDOUT GIVEN.  CONTINUE YOUR WEIGHT LOSS EFFORTS. LOSE 10 lbs. FOLLOW A LOW FAT DIET.  HANDOUT GIVEN. DISCUSSED BENEFITS, RISKS, AND MANAGEMENT OF GERD. TRY DEXILANT WITH YOUR FIRST MEAL. YOU CAN USE PROTONIX IN THE AFTERNOON IF NEEDED. CALL IF YOU WOULD LIKE TO SWITCH. ZOFRAN AS NEEDED FOR NAUSEA OR VOMITING. FOLLOW UP IN 3 MOS.

## 2015-04-08 NOTE — Progress Notes (Signed)
CC'D TO PCP °

## 2015-04-08 NOTE — Progress Notes (Signed)
ON RECALL  °

## 2015-04-12 ENCOUNTER — Encounter (HOSPITAL_COMMUNITY): Payer: Commercial Managed Care - HMO | Admitting: Physical Therapy

## 2015-04-12 ENCOUNTER — Other Ambulatory Visit: Payer: Self-pay | Admitting: Neurosurgery

## 2015-04-12 DIAGNOSIS — M5023 Other cervical disc displacement, cervicothoracic region: Secondary | ICD-10-CM

## 2015-04-13 ENCOUNTER — Ambulatory Visit
Admission: RE | Admit: 2015-04-13 | Discharge: 2015-04-13 | Disposition: A | Discharge status: Home / Self Care | Payer: Commercial Managed Care - HMO | Source: Ambulatory Visit | Attending: Neurosurgery | Admitting: Neurosurgery

## 2015-04-13 DIAGNOSIS — M25552 Pain in left hip: Secondary | ICD-10-CM

## 2015-04-13 DIAGNOSIS — M5023 Other cervical disc displacement, cervicothoracic region: Secondary | ICD-10-CM

## 2015-04-14 ENCOUNTER — Other Ambulatory Visit: Payer: Commercial Managed Care - HMO

## 2015-04-14 ENCOUNTER — Encounter (HOSPITAL_COMMUNITY): Payer: Commercial Managed Care - HMO | Admitting: Physical Therapy

## 2015-04-19 ENCOUNTER — Encounter (HOSPITAL_COMMUNITY): Payer: Commercial Managed Care - HMO | Admitting: Physical Therapy

## 2015-04-20 ENCOUNTER — Emergency Department (HOSPITAL_COMMUNITY)
Admission: EM | Admit: 2015-04-20 | Discharge: 2015-04-20 | Disposition: A | Discharge status: Home / Self Care | Payer: Commercial Managed Care - HMO | Attending: Emergency Medicine | Admitting: Emergency Medicine

## 2015-04-20 ENCOUNTER — Encounter (HOSPITAL_COMMUNITY): Payer: Self-pay | Admitting: Emergency Medicine

## 2015-04-20 DIAGNOSIS — Y998 Other external cause status: Secondary | ICD-10-CM | POA: Diagnosis not present

## 2015-04-20 DIAGNOSIS — M1990 Unspecified osteoarthritis, unspecified site: Secondary | ICD-10-CM | POA: Insufficient documentation

## 2015-04-20 DIAGNOSIS — X58XXXA Exposure to other specified factors, initial encounter: Secondary | ICD-10-CM | POA: Diagnosis not present

## 2015-04-20 DIAGNOSIS — Y9389 Activity, other specified: Secondary | ICD-10-CM | POA: Diagnosis not present

## 2015-04-20 DIAGNOSIS — Z872 Personal history of diseases of the skin and subcutaneous tissue: Secondary | ICD-10-CM | POA: Insufficient documentation

## 2015-04-20 DIAGNOSIS — I10 Essential (primary) hypertension: Secondary | ICD-10-CM | POA: Insufficient documentation

## 2015-04-20 DIAGNOSIS — Z8709 Personal history of other diseases of the respiratory system: Secondary | ICD-10-CM | POA: Diagnosis not present

## 2015-04-20 DIAGNOSIS — Y9289 Other specified places as the place of occurrence of the external cause: Secondary | ICD-10-CM | POA: Insufficient documentation

## 2015-04-20 DIAGNOSIS — G4700 Insomnia, unspecified: Secondary | ICD-10-CM | POA: Insufficient documentation

## 2015-04-20 DIAGNOSIS — G8929 Other chronic pain: Secondary | ICD-10-CM | POA: Diagnosis not present

## 2015-04-20 DIAGNOSIS — I2510 Atherosclerotic heart disease of native coronary artery without angina pectoris: Secondary | ICD-10-CM | POA: Insufficient documentation

## 2015-04-20 DIAGNOSIS — E039 Hypothyroidism, unspecified: Secondary | ICD-10-CM | POA: Diagnosis not present

## 2015-04-20 DIAGNOSIS — Z79899 Other long term (current) drug therapy: Secondary | ICD-10-CM | POA: Diagnosis not present

## 2015-04-20 DIAGNOSIS — Z8719 Personal history of other diseases of the digestive system: Secondary | ICD-10-CM | POA: Diagnosis not present

## 2015-04-20 DIAGNOSIS — T7840XA Allergy, unspecified, initial encounter: Secondary | ICD-10-CM | POA: Diagnosis not present

## 2015-04-20 DIAGNOSIS — F17210 Nicotine dependence, cigarettes, uncomplicated: Secondary | ICD-10-CM | POA: Diagnosis not present

## 2015-04-20 DIAGNOSIS — G4730 Sleep apnea, unspecified: Secondary | ICD-10-CM | POA: Insufficient documentation

## 2015-04-20 MED ORDER — SODIUM CHLORIDE 0.9 % IV BOLUS (SEPSIS)
1000.0000 mL | Freq: Once | INTRAVENOUS | Status: AC
  Administered 2015-04-20: 1000 mL via INTRAVENOUS

## 2015-04-20 MED ORDER — FAMOTIDINE IN NACL 20-0.9 MG/50ML-% IV SOLN
20.0000 mg | Freq: Once | INTRAVENOUS | Status: AC
  Administered 2015-04-20: 20 mg via INTRAVENOUS
  Filled 2015-04-20: qty 50

## 2015-04-20 MED ORDER — ONDANSETRON HCL 4 MG/2ML IJ SOLN: 4.0000 mg | Freq: Once | INTRAMUSCULAR | Status: DC

## 2015-04-20 MED ORDER — ONDANSETRON HCL 4 MG/2ML IJ SOLN
INTRAMUSCULAR | Status: AC
  Administered 2015-04-20: 4 mg
  Filled 2015-04-20: qty 2

## 2015-04-20 MED ORDER — DIPHENHYDRAMINE HCL 50 MG/ML IJ SOLN
25.0000 mg | Freq: Once | INTRAMUSCULAR | Status: AC
  Administered 2015-04-20: 25 mg via INTRAVENOUS
  Filled 2015-04-20: qty 1

## 2015-04-20 MED ORDER — PREDNISONE 20 MG PO TABS: ORAL_TABLET | ORAL | Status: DC

## 2015-04-20 MED ORDER — METHYLPREDNISOLONE SODIUM SUCC 125 MG IJ SOLR
125.0000 mg | Freq: Once | INTRAMUSCULAR | Status: AC
  Administered 2015-04-20: 125 mg via INTRAVENOUS
  Filled 2015-04-20: qty 2

## 2015-04-20 MED ORDER — LORAZEPAM 2 MG/ML IJ SOLN
0.5000 mg | Freq: Once | INTRAMUSCULAR | Status: AC
  Administered 2015-04-20: 0.5 mg via INTRAVENOUS
  Filled 2015-04-20: qty 1
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
## 2015-04-20 NOTE — ED Provider Notes (Signed)
CSN: [id number]     Arrival date & time 04/20/15  1703 History   First MD Initiated Contact with Patient 04/20/15 1713     Chief Complaint  Patient presents with  . Allergic Reaction     (Consider location/radiation/quality/duration/timing/severity/associated sxs/prior Treatment) HPI..... Level V caveat for urgent need for intervention.    Patient presents with hives on his face, neck, and chest, mild dyspnea after being bit by a dust mite a brief time ago. He took oral Benadryl which helped a small amount. He has a long history of allergic phenomena. Severity is moderate.  Past Medical History  Diagnosis Date  . CAD (coronary artery disease)   . Elevated liver enzymes   . Diverticulitis     Hx of; requiring 3 admissions  . Sigmoid colon ulcer     Rectal polyps  . Thyroid disease   . Psoriasis     Per medical history form dated 06/13/10.  . Chronic insomnia     Per medical history form dated 06/13/10.  . Colitis     Per medical history from dated 06/13/10.  Marland Kitchen Hypertension   . Hypothyroidism   . Bronchitis     history of  . Back pain     chronic  . Arthritis   . 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 Sleep apnea with use of continuous positive airway pressure (CPAP)    Past Surgical History  Procedure Laterality Date  . Colonoscopy  07/2008    Colitis,NSAID v. Ischemia,malrotation of the gut,Diverticulosis(L),hyperplastic  . Appendectomy      8th grade  . Shoulder surgery      Left  . Rotator cuff repair      Left - per medical history form dated 06/13/10.  . Thyroidectomy      Per medical history form dated 06/13/10.  . Laparoscopic sigmoid colectomy  2012    Dr. Fanny Skates: recurrent sigmoid diverticulitis  . Total hip arthroplasty  07/04/2011    Procedure: TOTAL HIP ARTHROPLASTY;  Surgeon: Johnny Bridge, MD;  Location: [facility];  Service: Orthopedics;  Laterality: Right;  . Joint replacement    . Cardiac catheterization  2009  . Cardiac  catheterization      Per medical history from dated 06/13/10.  . Colonoscopy N/A 12/13/2012    Dr. Oneida Alar: Normal TI, mild sigmoid diverticulosis, hemorrhoids, 2 polyps (tubular adenoma). Random colon bx negative. Next TCS 12/2022 with Fentanyl/phenergan  . Esophagogastroduodenoscopy N/A 04/16/2014    RMR: Erosive reflux esophagitis. Non critical Schzki's ring not manipulated. Small hiatal hernia. Abnormal gastirc mucosa of doubtful signigicance status post biopsy. I suspect trivial upper GI bleed. Recent abdominal pain presumably secondary to a protracted bout of diverticulitis. CT scan November 23 revealed improvemetn without complication. He finished his antibiotics 2 days age. Left sided ab   Family History  Problem Relation Age of Onset  . Cancer Mother     breast cancer - per medical history form dated 06/13/10.  . Diverticulitis Mother   . Hypertension Mother   . Cancer Father     skin - per medical history form dated 06/13/10.  Marland Kitchen Hypertension Father   . Anesthesia problems Neg Hx   . Hypotension Neg Hx   . Malignant hyperthermia Neg Hx   . Pseudochol deficiency Neg Hx   . Colon cancer Neg Hx    Social History  Substance Use Topics  . Smoking status: Current Some Day Smoker -- 0.50 packs/day for 18 years  Types: Cigarettes    Last Attempt to Quit: 08/15/2012  . Smokeless tobacco: Never Used     Comment:    . Alcohol Use: 0.0 oz/week    0 Standard drinks or equivalent per week     Comment: Occasional    Review of Systems  Reason unable to perform ROS: urgent need for intervention.      Allergies  Other and Iohexol  Home Medications   Prior to Admission medications   Medication Sig Start Date End Date Taking? Authorizing Provider  diphenhydrAMINE (BENADRYL) 25 MG tablet Take 50 mg by mouth once as needed for allergies.   Yes Historical Provider, MD  EPINEPHrine (EPIPEN) 0.3 mg/0.3 mL SOAJ Inject 0.3 mLs (0.3 mg total) into the muscle as needed. Patient taking  differently: Inject 0.3 mg into the muscle daily as needed (allergic reaction).  12/01/12  Yes Teressa Lower, MD  HYDROmorphone (DILAUDID) 4 MG tablet Take 1 tablet by mouth daily as needed for moderate pain or severe pain.  10/27/14  Yes Historical Provider, MD  levothyroxine (SYNTHROID, LEVOTHROID) 300 MCG tablet Take 300 mcg by mouth daily. 03/26/14  Yes Historical Provider, MD  meloxicam (MOBIC) 15 MG tablet Take 15 mg by mouth daily as needed for pain.   Yes Historical Provider, MD  olmesartan (BENICAR) 20 MG tablet Take 20 mg by mouth daily.    Yes Historical Provider, MD  ondansetron (ZOFRAN) 4 MG tablet 1 PO 30 MINUTES PRIOR TO MEALS TID AND AT BEDTIME 04/08/15  Yes Danie Binder, MD  pantoprazole (PROTONIX) 40 MG tablet Take 1 tablet (40 mg total) by mouth 2 (two) times daily before a meal. Take 1 tablet by mouth 2 time daily before a meal for 30 days then take 1 tab daily before a meal Patient taking differently: Take 40 mg by mouth daily. Take 1 tablet by mouth 2 time daily before a meal for 30 days then take 1 tab daily before a meal 07/15/14  Yes Danie Binder, MD  zolpidem (AMBIEN) 10 MG tablet Take 10 mg by mouth at bedtime.    Yes Historical Provider, MD  predniSONE (DELTASONE) 20 MG tablet Take 3 tablets upon onset of allergic symptoms 04/20/15   Nat Christen, MD   BP 127/85 mmHg  Pulse 77  Temp(Src) 97.7 F (36.5 C) (Oral)  Resp 21  Ht 6\' 1"  (1.854 m)  Wt 240 lb (108.863 kg)  BMI 31.67 kg/m2  SpO2 99% Physical Exam  Constitutional: He is oriented to person, place, and time. He appears well-developed and well-nourished.  HENT:  Head: Normocephalic and atraumatic.  Eyes: Conjunctivae and EOM are normal. Pupils are equal, round, and reactive to light.  Neck: Normal range of motion. Neck supple.  Cardiovascular: Normal rate and regular rhythm.   Pulmonary/Chest: Effort normal and breath sounds normal.  Abdominal: Soft. Bowel sounds are normal.  Musculoskeletal: Normal range of  motion.  Neurological: He is alert and oriented to person, place, and time.  Skin:  Erythema and wheals on face, neck, chest  Psychiatric: He has a normal mood and affect. His behavior is normal.  Nursing note and vitals reviewed.   ED Course  Procedures (including critical care time) Labs Review Labs Reviewed - No data to display  Imaging Review No results found. I have personally reviewed and evaluated these images and lab results as part of my medical decision-making.   EKG Interpretation None      MDM   Final diagnoses:  Allergic reaction,  initial encounter    Patient feels much better after IV steroid, IV Benadryl, IV Pepcid. Discharge medications prednisone    Nat Christen, MD 04/20/15 2108

## 2015-04-20 NOTE — Discharge Instructions (Signed)
Prescription for prednisone. Also recommend liquid Benadryl. Use your EpiPen for a serious reaction

## 2015-04-21 ENCOUNTER — Encounter (HOSPITAL_COMMUNITY): Payer: Commercial Managed Care - HMO | Admitting: Physical Therapy

## 2015-04-26 ENCOUNTER — Encounter (HOSPITAL_COMMUNITY): Payer: Commercial Managed Care - HMO | Admitting: Physical Therapy

## 2015-04-27 ENCOUNTER — Encounter (HOSPITAL_COMMUNITY): Payer: Self-pay | Admitting: Emergency Medicine

## 2015-04-27 ENCOUNTER — Emergency Department (HOSPITAL_COMMUNITY)
Admission: EM | Admit: 2015-04-27 | Discharge: 2015-04-27 | Disposition: A | Discharge status: Home / Self Care | Payer: Commercial Managed Care - HMO | Attending: Emergency Medicine | Admitting: Emergency Medicine

## 2015-04-27 DIAGNOSIS — Z8719 Personal history of other diseases of the digestive system: Secondary | ICD-10-CM | POA: Insufficient documentation

## 2015-04-27 DIAGNOSIS — M62830 Muscle spasm of back: Secondary | ICD-10-CM | POA: Diagnosis not present

## 2015-04-27 DIAGNOSIS — M1990 Unspecified osteoarthritis, unspecified site: Secondary | ICD-10-CM | POA: Insufficient documentation

## 2015-04-27 DIAGNOSIS — I10 Essential (primary) hypertension: Secondary | ICD-10-CM | POA: Diagnosis not present

## 2015-04-27 DIAGNOSIS — Z872 Personal history of diseases of the skin and subcutaneous tissue: Secondary | ICD-10-CM | POA: Diagnosis not present

## 2015-04-27 DIAGNOSIS — G4700 Insomnia, unspecified: Secondary | ICD-10-CM | POA: Insufficient documentation

## 2015-04-27 DIAGNOSIS — F17210 Nicotine dependence, cigarettes, uncomplicated: Secondary | ICD-10-CM | POA: Diagnosis not present

## 2015-04-27 DIAGNOSIS — Z791 Long term (current) use of non-steroidal anti-inflammatories (NSAID): Secondary | ICD-10-CM | POA: Diagnosis not present

## 2015-04-27 DIAGNOSIS — M62838 Other muscle spasm: Secondary | ICD-10-CM

## 2015-04-27 DIAGNOSIS — E039 Hypothyroidism, unspecified: Secondary | ICD-10-CM | POA: Insufficient documentation

## 2015-04-27 DIAGNOSIS — Z79899 Other long term (current) drug therapy: Secondary | ICD-10-CM | POA: Diagnosis not present

## 2015-04-27 DIAGNOSIS — I2510 Atherosclerotic heart disease of native coronary artery without angina pectoris: Secondary | ICD-10-CM | POA: Diagnosis not present

## 2015-04-27 DIAGNOSIS — Z9889 Other specified postprocedural states: Secondary | ICD-10-CM | POA: Diagnosis not present

## 2015-04-27 DIAGNOSIS — G8929 Other chronic pain: Secondary | ICD-10-CM | POA: Diagnosis not present

## 2015-04-27 DIAGNOSIS — M542 Cervicalgia: Secondary | ICD-10-CM

## 2015-04-27 MED ORDER — DIAZEPAM 5 MG/ML IJ SOLN
5.0000 mg | Freq: Once | INTRAMUSCULAR | Status: AC
  Administered 2015-04-27: 5 mg via INTRAVENOUS
  Filled 2015-04-27: qty 2

## 2015-04-27 MED ORDER — KETOROLAC TROMETHAMINE 30 MG/ML IJ SOLN
30.0000 mg | Freq: Once | INTRAMUSCULAR | Status: AC
  Administered 2015-04-27: 30 mg via INTRAVENOUS
  Filled 2015-04-27: qty 1

## 2015-04-27 MED ORDER — CYCLOBENZAPRINE HCL 10 MG PO TABS: 10.0000 mg | ORAL_TABLET | Freq: Three times a day (TID) | ORAL | Status: DC | PRN

## 2015-04-27 MED ORDER — METOCLOPRAMIDE HCL 5 MG/ML IJ SOLN
10.0000 mg | Freq: Once | INTRAMUSCULAR | Status: AC
  Administered 2015-04-27: 10 mg via INTRAVENOUS
  Filled 2015-04-27: qty 2

## 2015-04-27 MED ORDER — DEXAMETHASONE SODIUM PHOSPHATE 10 MG/ML IJ SOLN
10.0000 mg | Freq: Once | INTRAMUSCULAR | Status: AC
  Administered 2015-04-27: 10 mg via INTRAVENOUS
  Filled 2015-04-27: qty 1

## 2015-04-27 MED ORDER — SODIUM CHLORIDE 0.9 % IV SOLN
INTRAVENOUS | Status: DC
  Administered 2015-04-27: 02:00:00 via INTRAVENOUS

## 2015-04-27 MED ORDER — DIPHENHYDRAMINE HCL 50 MG/ML IJ SOLN
25.0000 mg | Freq: Once | INTRAMUSCULAR | Status: AC
  Administered 2015-04-27: 25 mg via INTRAVENOUS
  Filled 2015-04-27: qty 1

## 2015-04-27 NOTE — ED Notes (Signed)
Pt c/o neck pain, headache, nausea, and blurry vision that started 2.5hours ago. Pt states he is on waiting list for neck surgery.

## 2015-04-27 NOTE — ED Provider Notes (Signed)
CSN: [id number]     Arrival date & time 04/27/15  0146 History   First MD Initiated Contact with Patient 04/27/15 0158     Chief Complaint  Patient presents with  . Neck Pain     (Consider location/radiation/quality/duration/timing/severity/associated sxs/prior Treatment) HPI patient reports he had back surgery done by Dr. Saintclair Halsted in September. He is scheduled to have surgery on his neck on January 18, unless there is a cancellation earlier. He states they are discussing putting in metal plates and cadaver bone in his neck for severe degenerative changes. He states he's been having "terrible pain" however it got worse tonight at 11 PM. He states he took 2 Excedrin without relief. He states he is on no other pain medicine other than meloxicam that he has been on since his back surgery. He denies any change in his activity or any acute injury to make his pain worse. He states he has pain in his neck mainly on the left side but also between both shoulders. He states his pain is worse with any movement of his head or laying back. Nothing has made the pain better. He states he has generalized weakness without increasing weakness of his upper extremities. He states he feels dizzy when he lays his head back. He denies any new numbness or tingling in his arms or legs. He states he is not having any urinary or rectal incontinence. Patient states he is left-handed however the chronic weakness in his upper extremities has been worse on the right than left.   PCP Dr Gerarda Fraction Neurosurgery Dr Saintclair Halsted  Past Medical History  Diagnosis Date  . CAD (coronary artery disease)   . Elevated liver enzymes   . Diverticulitis     Hx of; requiring 3 admissions  . Sigmoid colon ulcer     Rectal polyps  . Thyroid disease   . Psoriasis     Per medical history form dated 06/13/10.  . Chronic insomnia     Per medical history form dated 06/13/10.  . Colitis     Per medical history from dated 06/13/10.  Marland Kitchen Hypertension   .  Hypothyroidism   . Bronchitis     history of  . Back pain     chronic  . Arthritis   . 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 Sleep apnea with use of continuous positive airway pressure (CPAP)    Past Surgical History  Procedure Laterality Date  . Colonoscopy  07/2008    Colitis,NSAID v. Ischemia,malrotation of the gut,Diverticulosis(L),hyperplastic  . Appendectomy      8th grade  . Shoulder surgery      Left  . Rotator cuff repair      Left - per medical history form dated 06/13/10.  . Thyroidectomy      Per medical history form dated 06/13/10.  . Laparoscopic sigmoid colectomy  2012    Dr. Fanny Skates: recurrent sigmoid diverticulitis  . Total hip arthroplasty  07/04/2011    Procedure: TOTAL HIP ARTHROPLASTY;  Surgeon: Johnny Bridge, MD;  Location: [facility];  Service: Orthopedics;  Laterality: Right;  . Joint replacement    . Cardiac catheterization  2009  . Cardiac catheterization      Per medical history from dated 06/13/10.  . Colonoscopy N/A 12/13/2012    Dr. Oneida Alar: Normal TI, mild sigmoid diverticulosis, hemorrhoids, 2 polyps (tubular adenoma). Random colon bx negative. Next TCS 12/2022 with Fentanyl/phenergan  . Esophagogastroduodenoscopy N/A 04/16/2014    RMR: Erosive reflux  esophagitis. Non critical Schzki's ring not manipulated. Small hiatal hernia. Abnormal gastirc mucosa of doubtful signigicance status post biopsy. I suspect trivial upper GI bleed. Recent abdominal pain presumably secondary to a protracted bout of diverticulitis. CT scan November 23 revealed improvemetn without complication. He finished his antibiotics 2 days age. Left sided ab   Family History  Problem Relation Age of Onset  . Cancer Mother     breast cancer - per medical history form dated 06/13/10.  . Diverticulitis Mother   . Hypertension Mother   . Cancer Father     skin - per medical history form dated 06/13/10.  Marland Kitchen Hypertension Father   . Anesthesia problems Neg Hx   .  Hypotension Neg Hx   . Malignant hyperthermia Neg Hx   . Pseudochol deficiency Neg Hx   . Colon cancer Neg Hx    Social History  Substance Use Topics  . Smoking status: Current Some Day Smoker -- 0.50 packs/day for 18 years    Types: Cigarettes    Last Attempt to Quit: 08/15/2012  . Smokeless tobacco: Never Used     Comment:    . Alcohol Use: 0.0 oz/week    0 Standard drinks or equivalent per week     Comment: Occasional  unemployed since June Smokes 5 cigs a day  Review of Systems  All other systems reviewed and are negative.     Allergies  Other and Iohexol  Home Medications   Prior to Admission medications   Medication Sig Start Date End Date Taking? Authorizing Provider  cyclobenzaprine (FLEXERIL) 10 MG tablet Take 1 tablet (10 mg total) by mouth 3 (three) times daily as needed. 04/27/15   Rolland Porter, MD  diphenhydrAMINE (BENADRYL) 25 MG tablet Take 50 mg by mouth once as needed for allergies.    Historical Provider, MD  EPINEPHrine (EPIPEN) 0.3 mg/0.3 mL SOAJ Inject 0.3 mLs (0.3 mg total) into the muscle as needed. Patient taking differently: Inject 0.3 mg into the muscle daily as needed (allergic reaction).  12/01/12   Teressa Lower, MD  HYDROmorphone (DILAUDID) 4 MG tablet Take 1 tablet by mouth daily as needed for moderate pain or severe pain.  10/27/14   Historical Provider, MD  levothyroxine (SYNTHROID, LEVOTHROID) 300 MCG tablet Take 300 mcg by mouth daily. 03/26/14   Historical Provider, MD  meloxicam (MOBIC) 15 MG tablet Take 15 mg by mouth daily as needed for pain.    Historical Provider, MD  olmesartan (BENICAR) 20 MG tablet Take 20 mg by mouth daily.     Historical Provider, MD  ondansetron (ZOFRAN) 4 MG tablet 1 PO 30 MINUTES PRIOR TO MEALS TID AND AT BEDTIME 04/08/15   Danie Binder, MD  pantoprazole (PROTONIX) 40 MG tablet Take 1 tablet (40 mg total) by mouth 2 (two) times daily before a meal. Take 1 tablet by mouth 2 time daily before a meal for 30 days then  take 1 tab daily before a meal Patient taking differently: Take 40 mg by mouth daily. Take 1 tablet by mouth 2 time daily before a meal for 30 days then take 1 tab daily before a meal 07/15/14   Danie Binder, MD  predniSONE (DELTASONE) 20 MG tablet Take 3 tablets upon onset of allergic symptoms 04/20/15   Nat Christen, MD  zolpidem (AMBIEN) 10 MG tablet Take 10 mg by mouth at bedtime.     Historical Provider, MD   BP 153/99 mmHg  Pulse 89  Temp(Src) 97.5 F (36.4  C)  Resp 20  Ht 6\' 1"  (1.854 m)  Wt 237 lb (107.502 kg)  BMI 31.27 kg/m2  SpO2 95%  Vital signs normal except hypertension  Physical Exam  Constitutional: He is oriented to person, place, and time. He appears well-developed and well-nourished.  Non-toxic appearance. He does not appear ill. He appears distressed.  HENT:  Head: Normocephalic and atraumatic.  Right Ear: External ear normal.  Left Ear: External ear normal.  Nose: Nose normal. No mucosal edema or rhinorrhea.  Mouth/Throat: Oropharynx is clear and moist and mucous membranes are normal. No dental abscesses or uvula swelling.  Eyes: Conjunctivae and EOM are normal. Pupils are equal, round, and reactive to light.  Neck: Full passive range of motion without pain.    Patient has diffuse tenderness of the left paraspinous muscles of the cervical spine. He also has bilateral tenderness over the trapezius muscles superiorly.  Cardiovascular: Normal rate, regular rhythm and normal heart sounds.  Exam reveals no gallop and no friction rub.   No murmur heard. Pulmonary/Chest: Effort normal and breath sounds normal. No respiratory distress. He has no wheezes. He has no rhonchi. He has no rales. He exhibits no tenderness and no crepitus.  Abdominal: Normal appearance.  Musculoskeletal: Normal range of motion. He exhibits no edema or tenderness.  Moves all extremities well.   Neurological: He is alert and oriented to person, place, and time. He has normal strength. No cranial  nerve deficit.  Grips are equal bilaterally.  Skin: Skin is warm, dry and intact. No rash noted. No erythema. No pallor.  Psychiatric: He has a normal mood and affect. His speech is normal and behavior is normal. His mood appears not anxious.  Nursing note and vitals reviewed.   ED Course  Procedures (including critical care time)  Medications  0.9 %  sodium chloride infusion ( Intravenous New Bag/Given 04/27/15 0225)  metoCLOPramide (REGLAN) injection 10 mg (10 mg Intravenous Given 04/27/15 0230)  diphenhydrAMINE (BENADRYL) injection 25 mg (25 mg Intravenous Given 04/27/15 0227)  dexamethasone (DECADRON) injection 10 mg (10 mg Intravenous Given 04/27/15 0228)  ketorolac (TORADOL) 30 MG/ML injection 30 mg (30 mg Intravenous Given 04/27/15 0225)  diazepam (VALIUM) injection 5 mg (5 mg Intravenous Given 04/27/15 0230)     Pt states he drove to the ED but he can have someone pick him up to go home.   Recheck at 03:15 AM pt is laying on his side, moves his head without apparent pain or restriction. States he is ready to be discharged.   Review of the [facility] shows patient did get #60 oxycodone 10/325 on August 31, September 30, and October 11 by his neurosurgeon. Before that he did receive #40 tablets on July 28 and August 18 from Wandra Feinstein in [place] at [facility].   Labs Review Labs Reviewed - No data to display  Imaging Review No results found.    Mr Cervical Spine Wo Contrast  04/13/2015  CLINICAL DATA:  Displacement of intervertebral disc cervical thoracic region. Bilateral arm weakness.  IMPRESSION: Mild spinal stenosis C4-5 right greater than left due to spurring C5-6 right-sided disc protrusion and osteophyte with cord flattening on the right and right foraminal stenosis. Small right-sided disc protrusion and spurring at C6-7 with mild spinal stenosis Electronically Signed   By: Franchot Gallo M.D.   On: 04/13/2015 09:02   Mr Hip Left  Wo Contrast  04/13/2015  CLINICAL DATA:  Left hip pain started after back surgery in  September.. IMPRESSION: 1. No hip fracture, dislocation or avascular necrosis. 2. Mild-moderate left hip osteoarthritis. Electronically Signed   By: Kathreen Devoid   On: 04/13/2015 09:37       MDM   patient presents with acute worsening of his chronic neck pain, patient is scheduled to have surgery soon. Patient has a lot of tenderness of the paraspinous muscles of his left cervical spine and also in his trapezius muscles bilaterally. He was given meds in the ED with improvement. Since he is supposed to have surgery soon he was not discharged home on steroids. But he was discharged home with Flexeril for his muscle spasm pain.    Final diagnoses:  Neck pain on left side  Muscle spasms of neck    New Prescriptions   CYCLOBENZAPRINE (FLEXERIL) 10 MG TABLET    Take 1 tablet (10 mg total) by mouth 3 (three) times daily as needed.    Plan discharge  Rolland Porter, MD, Barbette Or, MD 04/27/15 [phone]

## 2015-04-27 NOTE — Discharge Instructions (Signed)
Use ice and heat on your neck. Ice will numb the pain, heat will relax the muscles. Take the flexeril with your mobic. Let Dr Windy Carina office know about your ED visit tonight.  Recheck if you get worse.

## 2015-04-28 ENCOUNTER — Encounter (HOSPITAL_COMMUNITY): Payer: Commercial Managed Care - HMO | Admitting: Physical Therapy

## 2015-05-05 IMAGING — CR DG ABDOMEN ACUTE W/ 1V CHEST
3 series · 3 of 3 positions shown · non-contrast
Comparison: Abdominal radiograph 11/24/2013.

CLINICAL DATA: 48-year-old male with left lower quadrant abdominal
pain since 8 p.m. yesterday evening with concurrent dizziness,
nausea and vomiting bile/blood since 2 a.m. this morning.

EXAM:
ACUTE ABDOMEN SERIES (ABDOMEN 2 VIEW & CHEST 1 VIEW)

[view not recorded (1 of 3)]
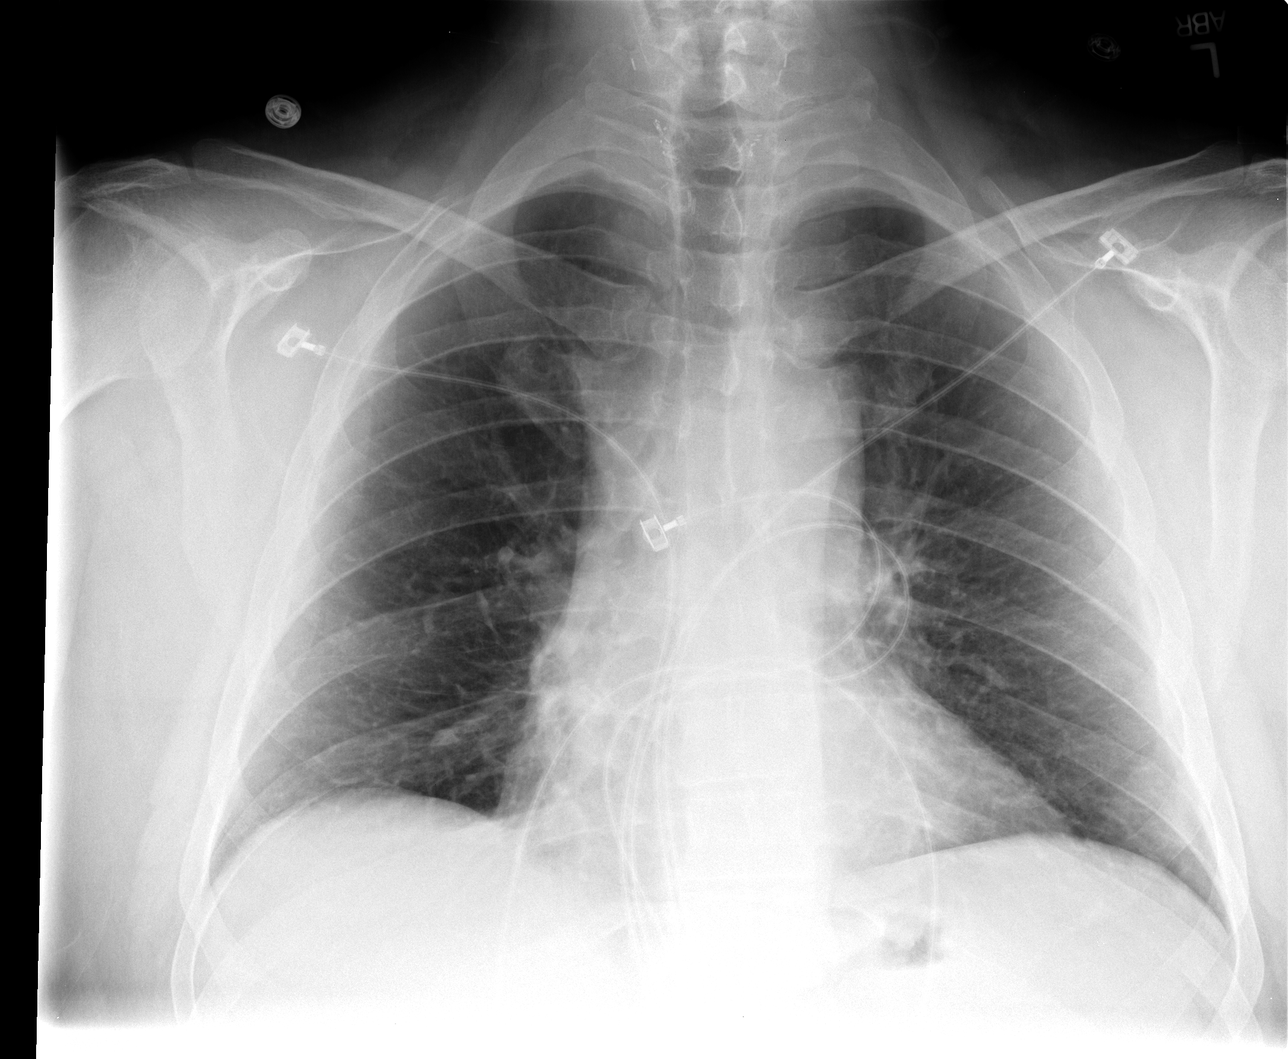

[view not recorded (2 of 3)]
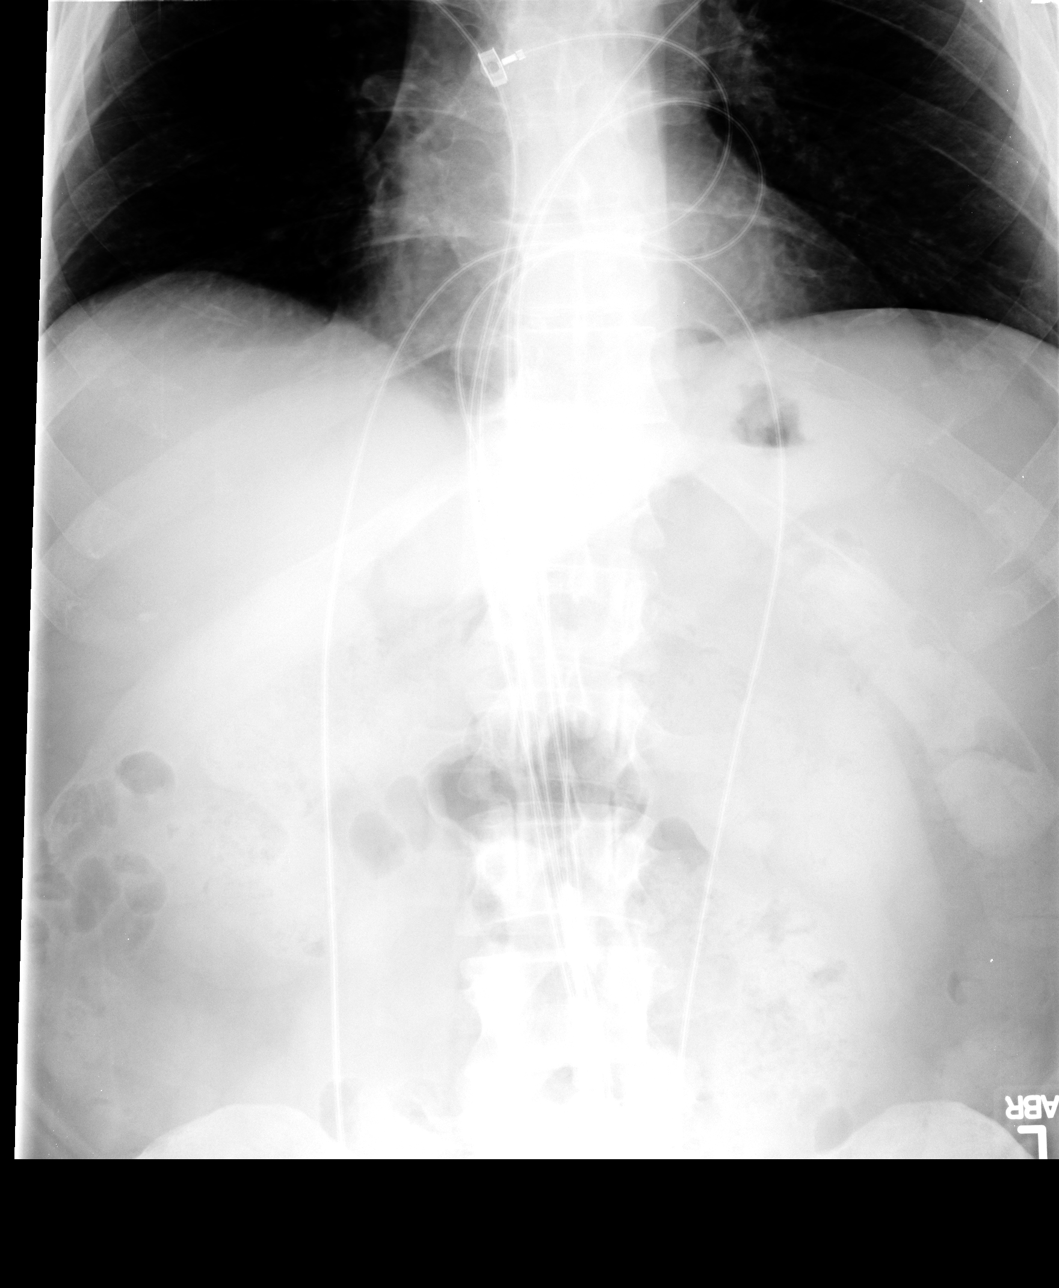

[view not recorded (3 of 3)]
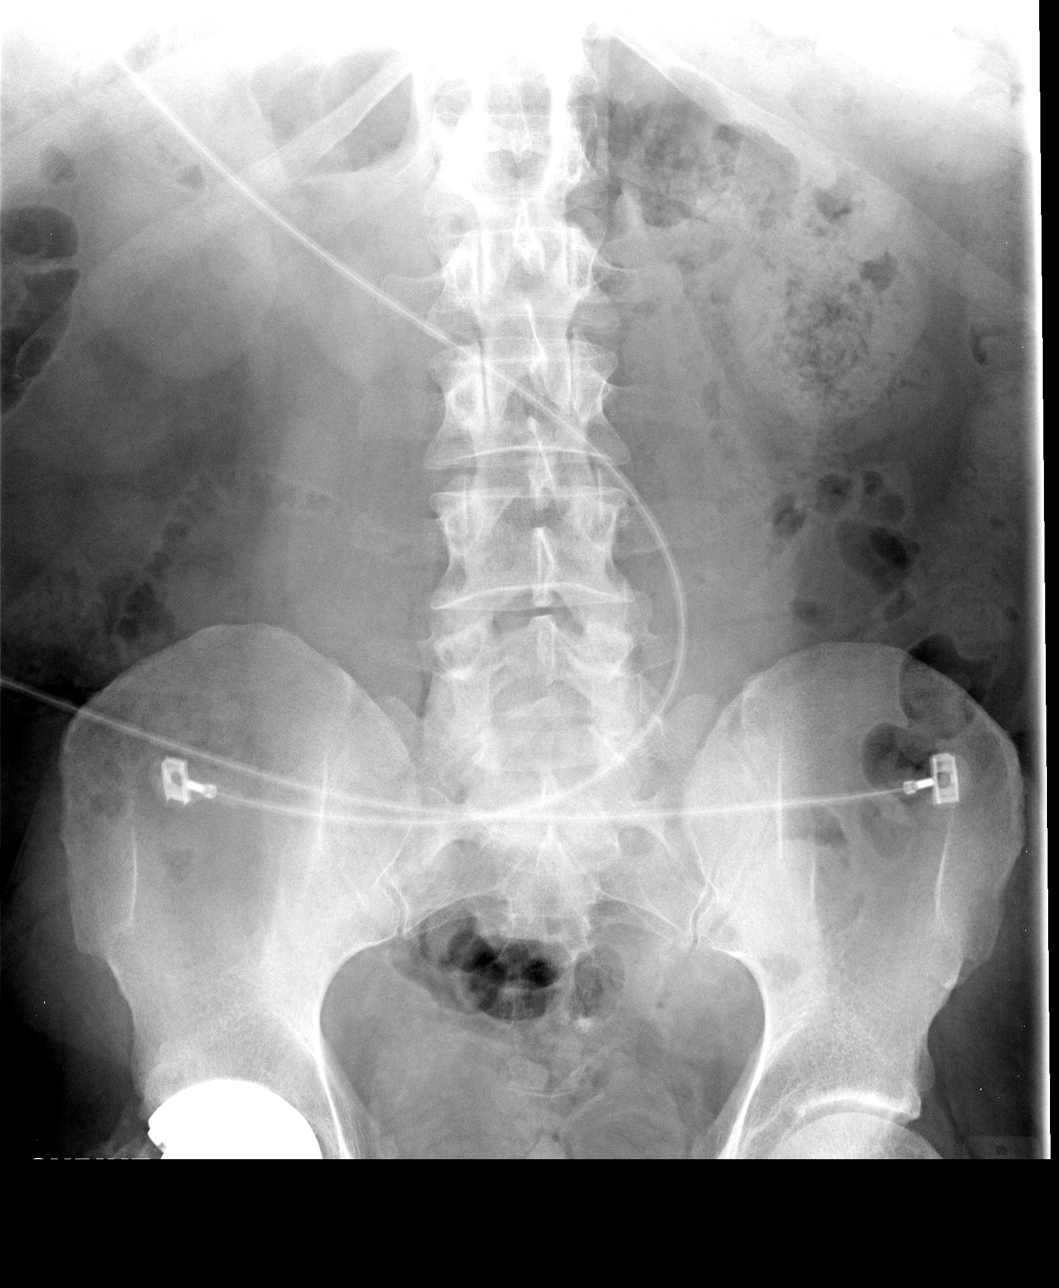

[3 of 3 positions shown; findings below may reference images not displayed]

FINDINGS: Lung volumes are low. No consolidative airspace disease. No pleural
effusions. No pneumothorax. No pulmonary nodule or mass noted.
Pulmonary vasculature and the cardiomediastinal silhouette are
within normal limits.

Gas and stool are seen scattered throughout the colon extending to
the level of the distal rectum. No pathologic distension of small
bowel is noted. No gross evidence of pneumoperitoneum. Status post
right total hip arthroplasty.
IMPRESSION: 1.  Nonobstructive bowel gas pattern.
2. No pneumoperitoneum.
3. Low lung volumes without radiographic evidence of acute
cardiopulmonary disease.

## 2015-05-31 ENCOUNTER — Encounter (HOSPITAL_COMMUNITY): Payer: Self-pay | Admitting: *Deleted

## 2015-05-31 ENCOUNTER — Emergency Department (HOSPITAL_COMMUNITY): Payer: Commercial Managed Care - HMO

## 2015-05-31 ENCOUNTER — Emergency Department (HOSPITAL_COMMUNITY)
Admission: EM | Admit: 2015-05-31 | Discharge: 2015-06-01 | Disposition: A | Discharge status: Home / Self Care | Payer: Commercial Managed Care - HMO | Attending: Emergency Medicine | Admitting: Emergency Medicine

## 2015-05-31 DIAGNOSIS — G4700 Insomnia, unspecified: Secondary | ICD-10-CM | POA: Diagnosis not present

## 2015-05-31 DIAGNOSIS — R109 Unspecified abdominal pain: Secondary | ICD-10-CM | POA: Diagnosis present

## 2015-05-31 DIAGNOSIS — R05 Cough: Secondary | ICD-10-CM | POA: Diagnosis not present

## 2015-05-31 DIAGNOSIS — Z8719 Personal history of other diseases of the digestive system: Secondary | ICD-10-CM | POA: Diagnosis not present

## 2015-05-31 DIAGNOSIS — Z8709 Personal history of other diseases of the respiratory system: Secondary | ICD-10-CM | POA: Diagnosis not present

## 2015-05-31 DIAGNOSIS — G4730 Sleep apnea, unspecified: Secondary | ICD-10-CM | POA: Insufficient documentation

## 2015-05-31 DIAGNOSIS — R509 Fever, unspecified: Secondary | ICD-10-CM | POA: Insufficient documentation

## 2015-05-31 DIAGNOSIS — Z9049 Acquired absence of other specified parts of digestive tract: Secondary | ICD-10-CM | POA: Diagnosis not present

## 2015-05-31 DIAGNOSIS — Z79899 Other long term (current) drug therapy: Secondary | ICD-10-CM | POA: Insufficient documentation

## 2015-05-31 DIAGNOSIS — F17210 Nicotine dependence, cigarettes, uncomplicated: Secondary | ICD-10-CM | POA: Insufficient documentation

## 2015-05-31 DIAGNOSIS — I10 Essential (primary) hypertension: Secondary | ICD-10-CM | POA: Insufficient documentation

## 2015-05-31 DIAGNOSIS — R112 Nausea with vomiting, unspecified: Secondary | ICD-10-CM

## 2015-05-31 DIAGNOSIS — G8929 Other chronic pain: Secondary | ICD-10-CM | POA: Insufficient documentation

## 2015-05-31 DIAGNOSIS — R000 Tachycardia, unspecified: Secondary | ICD-10-CM | POA: Insufficient documentation

## 2015-05-31 DIAGNOSIS — K5900 Constipation, unspecified: Secondary | ICD-10-CM

## 2015-05-31 DIAGNOSIS — E039 Hypothyroidism, unspecified: Secondary | ICD-10-CM | POA: Diagnosis not present

## 2015-05-31 DIAGNOSIS — I2510 Atherosclerotic heart disease of native coronary artery without angina pectoris: Secondary | ICD-10-CM | POA: Diagnosis not present

## 2015-05-31 DIAGNOSIS — Z9981 Dependence on supplemental oxygen: Secondary | ICD-10-CM | POA: Diagnosis not present

## 2015-05-31 DIAGNOSIS — Z872 Personal history of diseases of the skin and subcutaneous tissue: Secondary | ICD-10-CM | POA: Insufficient documentation

## 2015-05-31 LAB — COMPREHENSIVE METABOLIC PANEL
ALT: 28 U/L (ref 17–63)
AST: 24 U/L (ref 15–41)
Albumin: 4.2 g/dL (ref 3.5–5.0)
Alkaline Phosphatase: 75 U/L (ref 38–126)
Anion gap: 13 (ref 5–15)
BUN: 16 mg/dL (ref 6–20)
CO2: 19 mmol/L — ABNORMAL LOW (ref 22–32)
Calcium: 9.6 mg/dL (ref 8.9–10.3)
Chloride: 104 mmol/L (ref 101–111)
Creatinine, Ser: 1.06 mg/dL (ref 0.61–1.24)
GFR calc Af Amer: >60 mL/min (ref >60)
GFR calc non Af Amer: >60 mL/min (ref >60)
Glucose, Bld: 185 mg/dL — ABNORMAL HIGH (ref 65–99)
Potassium: 3.4 mmol/L — ABNORMAL LOW (ref 3.5–5.1)
Sodium: 136 mmol/L (ref 135–145)
Total Bilirubin: 1 mg/dL (ref 0.3–1.2)
Total Protein: 7.8 g/dL (ref 6.5–8.1)

## 2015-05-31 LAB — I-STAT CG4 LACTIC ACID, ED
Lactic Acid, Venous: 1.1 mmol/L (ref 0.5–2.0)
Lactic Acid, Venous: 2.49 mmol/L (ref 0.5–2.0)

## 2015-05-31 LAB — CBC WITH DIFFERENTIAL/PLATELET
Basophils Absolute: 0.1 10*3/uL (ref 0.0–0.1)
Basophils Relative: 0 %
Eosinophils Absolute: 0.2 10*3/uL (ref 0.0–0.7)
Eosinophils Relative: 2 %
HCT: 49.6 % (ref 39.0–52.0)
Hemoglobin: 17.6 g/dL — ABNORMAL HIGH (ref 13.0–17.0)
Lymphocytes Relative: 17 %
Lymphs Abs: 2.1 10*3/uL (ref 0.7–4.0)
MCH: 33.1 pg (ref 26.0–34.0)
MCHC: 35.5 g/dL (ref 30.0–36.0)
MCV: 93.4 fL (ref 78.0–100.0)
Monocytes Absolute: 0.7 10*3/uL (ref 0.1–1.0)
Monocytes Relative: 6 %
Neutro Abs: 9.3 10*3/uL — ABNORMAL HIGH (ref 1.7–7.7)
Neutrophils Relative %: 75 %
Platelets: 283 10*3/uL (ref 150–400)
RBC: 5.31 MIL/uL (ref 4.22–5.81)
RDW: 12.4 % (ref 11.5–15.5)
WBC: 12.4 10*3/uL — ABNORMAL HIGH (ref 4.0–10.5)

## 2015-05-31 LAB — LIPASE, BLOOD: Lipase: 13 U/L (ref 11–51)

## 2015-05-31 MED ORDER — ONDANSETRON HCL 4 MG/2ML IJ SOLN
4.0000 mg | Freq: Once | INTRAMUSCULAR | Status: AC
  Administered 2015-05-31: 4 mg via INTRAVENOUS
  Filled 2015-05-31: qty 2

## 2015-05-31 MED ORDER — GLYCERIN (LAXATIVE) 2.1 G RE SUPP
1.0000 | Freq: Once | RECTAL | Status: DC
  Filled 2015-05-31: qty 1

## 2015-05-31 MED ORDER — SODIUM CHLORIDE 0.9 % IV SOLN
Freq: Once | INTRAVENOUS | Status: AC
  Administered 2015-06-01: via INTRAVENOUS

## 2015-05-31 MED ORDER — ONDANSETRON HCL 4 MG/2ML IJ SOLN: 4.0000 mg | Freq: Once | INTRAMUSCULAR | Status: DC

## 2015-05-31 MED ORDER — BISACODYL 10 MG RE SUPP
RECTAL | Status: AC
  Filled 2015-05-31: qty 1

## 2015-05-31 MED ORDER — SODIUM CHLORIDE 0.9 % IV BOLUS (SEPSIS)
1000.0000 mL | Freq: Once | INTRAVENOUS | Status: AC
  Administered 2015-05-31: 1000 mL via INTRAVENOUS

## 2015-05-31 MED ORDER — BISACODYL 10 MG RE SUPP
10.0000 mg | Freq: Once | RECTAL | Status: AC
  Administered 2015-05-31: 10 mg via RECTAL

## 2015-05-31 MED ORDER — SODIUM CHLORIDE 0.9 % IV SOLN
Freq: Once | INTRAVENOUS | Status: AC
  Administered 2015-05-31: 23:00:00 via INTRAVENOUS

## 2015-05-31 MED ORDER — MORPHINE SULFATE (PF) 4 MG/ML IV SOLN
4.0000 mg | Freq: Once | INTRAVENOUS | Status: AC
  Administered 2015-05-31: 4 mg via INTRAVENOUS
  Filled 2015-05-31: qty 1

## 2015-05-31 NOTE — ED Notes (Signed)
Patient on cardiac monitoring as a precaution.

## 2015-05-31 NOTE — ED Provider Notes (Signed)
CSN: [id number]     Arrival date & time 05/31/15  2016 History   First MD Initiated Contact with Patient 05/31/15 2042     Chief Complaint  Patient presents with  . Abdominal Pain   (Consider location/radiation/quality/duration/timing/severity/associated sxs/prior Treatment) Patient is a 50 y.o. male presenting with abdominal pain. The history is provided by the patient. No language interpreter was used.  Abdominal Pain Associated symptoms: constipation, cough, fever and vomiting   Associated symptoms: no diarrhea, no nausea and no shortness of breath     Mr. Kazi is a 50 year old male with a past medical history of diverticulitis, sigmoid colon ulcer, thyroid disease, colitis, hypertension, hypothyroidism, sleep apnea and uses CPAP, appendectomy, laparoscopic sigmoid colectomy who presents by EMS for multiple episodes of vomiting that began earlier today with abdominal pain. He also reports a nonproductive cough for the past couple of days. His wife states that he had a low-grade fever on Friday of 99 and that the fever jumped to 101 on Saturday. He had a recent cervical spine surgery last week and was discharged on Thursday. Wife called the surgeon who stated that he may be dehydrated versus pneumonia. He also mentioned that he has not had a bowel movement in 6 days. He has been taking a lot of pain medication but has recently been tapering off according to his wife. He reports that he had one bowel surgery in the past where he had his appendix removed and also had a colon resection at the same time due to diverticulitis.  He smokes on occasion. He denies any syncopal episode or shortness of breath.  Past Medical History  Diagnosis Date  . CAD (coronary artery disease)   . Elevated liver enzymes   . Diverticulitis     Hx of; requiring 3 admissions  . Sigmoid colon ulcer     Rectal polyps  . Thyroid disease   . Psoriasis     Per medical history form dated 06/13/10.  . Chronic insomnia      Per medical history form dated 06/13/10.  . Colitis     Per medical history from dated 06/13/10.  Marland Kitchen Hypertension   . Hypothyroidism   . Bronchitis     history of  . Back pain     chronic  . Arthritis   . 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 Sleep apnea with use of continuous positive airway pressure (CPAP)    Past Surgical History  Procedure Laterality Date  . Colonoscopy  07/2008    Colitis,NSAID v. Ischemia,malrotation of the gut,Diverticulosis(L),hyperplastic  . Appendectomy      8th grade  . Shoulder surgery      Left  . Rotator cuff repair      Left - per medical history form dated 06/13/10.  . Thyroidectomy      Per medical history form dated 06/13/10.  . Laparoscopic sigmoid colectomy  2012    Dr. Fanny Skates: recurrent sigmoid diverticulitis  . Total hip arthroplasty  07/04/2011    Procedure: TOTAL HIP ARTHROPLASTY;  Surgeon: Johnny Bridge, MD;  Location: [place];  Service: Orthopedics;  Laterality: Right;  . Joint replacement    . Cardiac catheterization  2009  . Cardiac catheterization      Per medical history from dated 06/13/10.  . Colonoscopy N/A 12/13/2012    Dr. Oneida Alar: Normal TI, mild sigmoid diverticulosis, hemorrhoids, 2 polyps (tubular adenoma). Random colon bx negative. Next TCS 12/2022 with Fentanyl/phenergan  . Esophagogastroduodenoscopy N/A 04/16/2014  [facility]: Erosive reflux esophagitis. Non critical Schzki's ring not manipulated. Small hiatal hernia. Abnormal gastirc mucosa of doubtful signigicance status post biopsy. I suspect trivial upper GI bleed. Recent abdominal pain presumably secondary to a protracted bout of diverticulitis. CT scan November 23 revealed improvemetn without complication. He finished his antibiotics 2 days age. Left sided ab  . Cervical spine surgery      C4, C5, C6 spinal fusion   Family History  Problem Relation Age of Onset  . Cancer Mother     breast cancer - per medical history form dated 06/13/10.  .  Diverticulitis Mother   . Hypertension Mother   . Cancer Father     skin - per medical history form dated 06/13/10.  Marland Kitchen Hypertension Father   . Anesthesia problems Neg Hx   . Hypotension Neg Hx   . Malignant hyperthermia Neg Hx   . Pseudochol deficiency Neg Hx   . Colon cancer Neg Hx    Social History  Substance Use Topics  . Smoking status: Current Some Day Smoker -- 0.50 packs/day for 18 years    Types: Cigarettes    Last Attempt to Quit: 08/15/2012  . Smokeless tobacco: Never Used     Comment:    . Alcohol Use: 0.0 oz/week    0 Standard drinks or equivalent per week     Comment: Occasional    Review of Systems  Constitutional: Positive for fever.  Respiratory: Positive for cough. Negative for shortness of breath.   Gastrointestinal: Positive for vomiting, abdominal pain and constipation. Negative for nausea and diarrhea.  All other systems reviewed and are negative.     Allergies  Other and Iohexol  Home Medications   Prior to Admission medications   Medication Sig Start Date End Date Taking? Authorizing Provider  cyclobenzaprine (FLEXERIL) 10 MG tablet Take 1 tablet (10 mg total) by mouth 3 (three) times daily as needed. Patient taking differently: Take 10 mg by mouth 3 (three) times daily as needed for muscle spasms.  04/27/15  Yes Rolland Porter, MD  diphenhydrAMINE (BENADRYL) 25 MG tablet Take 50 mg by mouth once as needed for allergies.   Yes Historical Provider, MD  EPINEPHrine (EPIPEN) 0.3 mg/0.3 mL SOAJ Inject 0.3 mLs (0.3 mg total) into the muscle as needed. Patient taking differently: Inject 0.3 mg into the muscle daily as needed (allergic reaction).  12/01/12  Yes Teressa Lower, MD  escitalopram (LEXAPRO) 10 MG tablet Take 10 mg by mouth daily. 04/28/15  Yes Historical Provider, MD  levothyroxine (SYNTHROID, LEVOTHROID) 300 MCG tablet Take 300 mcg by mouth daily. 03/26/14  Yes Historical Provider, MD  olmesartan (BENICAR) 20 MG tablet Take 20 mg by mouth daily.    Yes  Historical Provider, MD  oxyCODONE-acetaminophen (PERCOCET) 10-325 MG tablet Take 0.5-1 tablets by mouth daily as needed for pain.   Yes Historical Provider, MD  oxymetazoline (AFRIN) 0.05 % nasal spray Place 1 spray into both nostrils 2 (two) times daily as needed for congestion.   Yes Historical Provider, MD  pantoprazole (PROTONIX) 40 MG tablet Take 1 tablet (40 mg total) by mouth 2 (two) times daily before a meal. Take 1 tablet by mouth 2 time daily before a meal for 30 days then take 1 tab daily before a meal Patient taking differently: Take 40 mg by mouth daily. Take 1 tablet by mouth 2 time daily before a meal for 30 days then take 1 tab daily before a meal 07/15/14  Yes Danie Binder, MD  zolpidem (AMBIEN) 10 MG tablet Take 10 mg by mouth at bedtime.    Yes Historical Provider, MD  ondansetron (ZOFRAN) 4 MG tablet Take 1 tablet (4 mg total) by mouth every 6 (six) hours. 06/01/15   Shenandoah Yeats Patel-Mills, PA-C  polyethylene glycol (MIRALAX / GLYCOLAX) packet Take 17 g by mouth daily. 06/01/15   Alexi Geibel Patel-Mills, PA-C   BP 124/79 mmHg  Pulse 91  Temp(Src) 98 F (36.7 C) (Oral)  Resp 24  Ht 6\' 1"  (1.854 m)  Wt 107.956 kg  BMI 31.41 kg/m2  SpO2 97% Physical Exam  Constitutional: He is oriented to person, place, and time. He appears well-developed and well-nourished. No distress.  HENT:  Head: Normocephalic and atraumatic.  Eyes: Conjunctivae are normal.  Neck: Normal range of motion. Neck supple.    Surgical site as diagrammed is well-appearing with butterfly stitches. I couple of them were removed by me to view the wound. It had no active drainage or surrounding erythema. It appeared to be healing well.  Cardiovascular: Regular rhythm and normal heart sounds.  Tachycardia present.   Tachycardic  Pulmonary/Chest: Effort normal and breath sounds normal. No respiratory distress. He has no wheezes. He has no rales.  Abdominal: Soft. There is tenderness.    Left-sided abdominal tenderness  to palpation. Abdomen is not distended.  Musculoskeletal: Normal range of motion. He exhibits no edema or tenderness.  No lower extremity edema or tenderness.  Neurological: He is alert and oriented to person, place, and time.  Skin: Skin is warm and dry.  Psychiatric: He has a normal mood and affect.  Nursing note and vitals reviewed.   ED Course  Procedures (including critical care time) Labs Review Labs Reviewed  CBC WITH DIFFERENTIAL/PLATELET - Abnormal; Notable for the following:    WBC 12.4 (*)    Hemoglobin 17.6 (*)    Neutro Abs 9.3 (*)    All other components within normal limits  COMPREHENSIVE METABOLIC PANEL - Abnormal; Notable for the following:    Potassium 3.4 (*)    CO2 19 (*)    Glucose, Bld 185 (*)    All other components within normal limits  URINALYSIS, ROUTINE W REFLEX MICROSCOPIC (NOT AT [facility]) - Abnormal; Notable for the following:    Color, Urine AMBER (*)    Hgb urine dipstick TRACE (*)    Ketones, ur TRACE (*)    Protein, ur TRACE (*)    All other components within normal limits  URINE MICROSCOPIC-ADD ON - Abnormal; Notable for the following:    Squamous Epithelial / LPF 0-5 (*)    Bacteria, UA MANY (*)    Casts GRANULAR CAST (*)    All other components within normal limits  I-STAT CG4 LACTIC ACID, ED - Abnormal; Notable for the following:    Lactic Acid, Venous 2.49 (*)    All other components within normal limits  LIPASE, BLOOD  I-STAT CG4 LACTIC ACID, ED  I-STAT CG4 LACTIC ACID, ED    Imaging Review Dg Chest 2 View  05/31/2015  CLINICAL DATA:  Fever last night. Nausea. Status post cervical surgery in the past week. Initial encounter. EXAM: CHEST  2 VIEW COMPARISON:  PA and lateral chest 07/10/2014. FINDINGS: The lungs are clear. Heart size is normal. There is no pneumothorax or pleural effusion. No focal bony abnormality. IMPRESSION: Negative chest. Electronically Signed   By: Inge Rise M.D.   On: 05/31/2015 21:44   Dg Abd 1  View  05/31/2015  CLINICAL DATA:  Patient  had neck surgery last week. Patient started feeling nauseous and weak with fever last night. Vomiting with stomach cramping today. Cough. EXAM: ABDOMEN - 1 VIEW COMPARISON:  None. FINDINGS: Prominent stool in the colon, particularly in the left colon. No small or large bowel distention. No radiopaque stones. Right hip arthroplasty. Visualized bones appear otherwise intact. IMPRESSION: Stool-filled colon consistent with constipation. No evidence of obstruction. Electronically Signed   By: Lucienne Capers M.D.   On: 05/31/2015 21:44   I have personally reviewed and evaluated these images and lab results as part of my medical decision-making.   EKG Interpretation None      MDM   Final diagnoses:  Constipation, unspecified constipation type  Non-intractable vomiting with nausea, vomiting of unspecified type   Patient presents for multiple complaints including intermittent fevers x 2 days, nausea and multiple episodes of vomiting today, and constipation 6 days. He had surgery on his cervical spine 5 days ago and denies having any severe neck pain now. He denies taking any medications for fever today. Upon arrival, he was tachycardic but afebrile. His labs were not terribly concerning and he had a leukocytosis of 12.4. He had a lactic acid of 2.5.  An x-ray of the abdomen was obtained to rule out obstruction. It showed stool filled colon consistent with constipation but no evidence of obstruction. He also had a chest x-ray done due to coughing. This was obtained to rule out pneumonia since he was recently admitted to the hospital for surgery and was negative for pneumothorax, edema, or pneumonia. I discussed findings with the patient and his wife. Patient admits to drinking very little over the last couple of days. He was given 3 L of fluid while in the ED. His lactic acid normalized. He felt much better after pain medication and Zofran. He was given a  glycerin suppository. He did not have a bowel movement while in the ED. I discussed return precautions thoroughly with the patient. He was also prescribed MiraLAX and Zofran to go home with. I discussed following up with his PCP tomorrow as well as return precautions. He has a follow-up appointment for his recent surgery in 8 days. He had no signs of infection at the cervical surgical site on the right anterior neck. Patient and wife are agreeable to plan.    Ottie Glazier, PA-C 06/01/15 [id number]  Virgel Manifold, MD 06/02/15 1335

## 2015-05-31 NOTE — ED Notes (Signed)
Pt brought in by [facility] for c/o abdominal pain and vomiting today; pt states he has not had a BM in 6 days; pt has been running low grade fever

## 2015-06-01 LAB — URINALYSIS, ROUTINE W REFLEX MICROSCOPIC
Bilirubin Urine: NEGATIVE
Glucose, UA: NEGATIVE mg/dL
Leukocytes, UA: NEGATIVE
Nitrite: NEGATIVE
Specific Gravity, Urine: 1.02 (ref 1.005–1.030)
pH: 5.5 (ref 5.0–8.0)

## 2015-06-01 LAB — URINE MICROSCOPIC-ADD ON

## 2015-06-01 MED ORDER — POLYETHYLENE GLYCOL 3350 17 G PO PACK: 17.0000 g | PACK | Freq: Every day | ORAL | Status: DC

## 2015-06-01 MED ORDER — ONDANSETRON HCL 4 MG/2ML IJ SOLN
4.0000 mg | Freq: Once | INTRAMUSCULAR | Status: AC
  Administered 2015-06-01: 4 mg via INTRAMUSCULAR
  Filled 2015-06-01: qty 2

## 2015-06-01 MED ORDER — ONDANSETRON HCL 4 MG PO TABS: 4.0000 mg | ORAL_TABLET | Freq: Four times a day (QID) | ORAL | Status: DC

## 2015-06-01 NOTE — Discharge Instructions (Signed)
Constipation, Adult Return for inability to tolerate fluids, fever, or increased abdominal pain and vomiting. Take MiraLAX as prescribed for constipation. Take Zofran for nausea. Constipation is when a person has fewer than three bowel movements a week, has difficulty having a bowel movement, or has stools that are dry, hard, or larger than normal. As people grow older, constipation is more common. A low-fiber diet, not taking in enough fluids, and taking certain medicines may make constipation worse.  CAUSES   Certain medicines, such as antidepressants, pain medicine, iron supplements, antacids, and water pills.   Certain diseases, such as diabetes, irritable bowel syndrome (IBS), thyroid disease, or depression.   Not drinking enough water.   Not eating enough fiber-rich foods.   Stress or travel.   Lack of physical activity or exercise.   Ignoring the urge to have a bowel movement.   Using laxatives too much.  SIGNS AND SYMPTOMS   Having fewer than three bowel movements a week.   Straining to have a bowel movement.   Having stools that are hard, dry, or larger than normal.   Feeling full or bloated.   Pain in the lower abdomen.   Not feeling relief after having a bowel movement.  DIAGNOSIS  Your health care provider will take a medical history and perform a physical exam. Further testing may be done for severe constipation. Some tests may include:  A barium enema X-ray to examine your rectum, colon, and, sometimes, your small intestine.   A sigmoidoscopy to examine your lower colon.   A colonoscopy to examine your entire colon. TREATMENT  Treatment will depend on the severity of your constipation and what is causing it. Some dietary treatments include drinking more fluids and eating more fiber-rich foods. Lifestyle treatments may include regular exercise. If these diet and lifestyle recommendations do not help, your health care provider may recommend taking  over-the-counter laxative medicines to help you have bowel movements. Prescription medicines may be prescribed if over-the-counter medicines do not work.  HOME CARE INSTRUCTIONS   Eat foods that have a lot of fiber, such as fruits, vegetables, whole grains, and beans.  Limit foods high in fat and processed sugars, such as french fries, hamburgers, cookies, candies, and soda.   A fiber supplement may be added to your diet if you cannot get enough fiber from foods.   Drink enough fluids to keep your urine clear or pale yellow.   Exercise regularly or as directed by your health care provider.   Go to the restroom when you have the urge to go. Do not hold it.   Only take over-the-counter or prescription medicines as directed by your health care provider. Do not take other medicines for constipation without talking to your health care provider first.  [place] IF:   You have bright red blood in your stool.   Your constipation lasts for more than 4 days or gets worse.   You have abdominal or rectal pain.   You have thin, pencil-like stools.   You have unexplained weight loss. MAKE SURE YOU:   Understand these instructions.  Will watch your condition.  Will get help right away if you are not doing well or get worse.   This information is not intended to replace advice given to you by your health care provider. Make sure you discuss any questions you have with your health care provider.   Document Released: 01/21/2004 Document Revised: 05/15/2014 Document Reviewed: 02/03/2013 Elsevier Interactive Patient Education 2016  [employer] ° °

## 2015-06-22 ENCOUNTER — Encounter: Payer: Self-pay | Admitting: Gastroenterology

## 2015-08-24 ENCOUNTER — Emergency Department (HOSPITAL_COMMUNITY): Payer: Commercial Managed Care - HMO

## 2015-08-24 ENCOUNTER — Observation Stay (HOSPITAL_COMMUNITY)
Admission: EM | Admit: 2015-08-24 | Discharge: 2015-08-26 | Disposition: A | Discharge status: Home / Self Care | Payer: Commercial Managed Care - HMO | Attending: Internal Medicine | Admitting: Internal Medicine

## 2015-08-24 ENCOUNTER — Encounter (HOSPITAL_COMMUNITY): Payer: Self-pay | Admitting: Emergency Medicine

## 2015-08-24 DIAGNOSIS — E890 Postprocedural hypothyroidism: Secondary | ICD-10-CM | POA: Diagnosis not present

## 2015-08-24 DIAGNOSIS — K529 Noninfective gastroenteritis and colitis, unspecified: Secondary | ICD-10-CM | POA: Diagnosis not present

## 2015-08-24 DIAGNOSIS — F5104 Psychophysiologic insomnia: Secondary | ICD-10-CM | POA: Diagnosis not present

## 2015-08-24 DIAGNOSIS — F17210 Nicotine dependence, cigarettes, uncomplicated: Secondary | ICD-10-CM | POA: Insufficient documentation

## 2015-08-24 DIAGNOSIS — R1011 Right upper quadrant pain: Secondary | ICD-10-CM

## 2015-08-24 DIAGNOSIS — R0789 Other chest pain: Secondary | ICD-10-CM | POA: Insufficient documentation

## 2015-08-24 DIAGNOSIS — Z8719 Personal history of other diseases of the digestive system: Secondary | ICD-10-CM | POA: Insufficient documentation

## 2015-08-24 DIAGNOSIS — K810 Acute cholecystitis: Secondary | ICD-10-CM | POA: Insufficient documentation

## 2015-08-24 DIAGNOSIS — E785 Hyperlipidemia, unspecified: Secondary | ICD-10-CM | POA: Diagnosis not present

## 2015-08-24 DIAGNOSIS — I2510 Atherosclerotic heart disease of native coronary artery without angina pectoris: Secondary | ICD-10-CM | POA: Diagnosis not present

## 2015-08-24 DIAGNOSIS — I10 Essential (primary) hypertension: Secondary | ICD-10-CM | POA: Diagnosis present

## 2015-08-24 DIAGNOSIS — Z96641 Presence of right artificial hip joint: Secondary | ICD-10-CM | POA: Diagnosis not present

## 2015-08-24 DIAGNOSIS — M1611 Unilateral primary osteoarthritis, right hip: Secondary | ICD-10-CM | POA: Diagnosis not present

## 2015-08-24 DIAGNOSIS — K760 Fatty (change of) liver, not elsewhere classified: Secondary | ICD-10-CM | POA: Insufficient documentation

## 2015-08-24 DIAGNOSIS — G4730 Sleep apnea, unspecified: Secondary | ICD-10-CM | POA: Diagnosis not present

## 2015-08-24 DIAGNOSIS — R1030 Lower abdominal pain, unspecified: Secondary | ICD-10-CM

## 2015-08-24 DIAGNOSIS — R197 Diarrhea, unspecified: Secondary | ICD-10-CM | POA: Diagnosis not present

## 2015-08-24 DIAGNOSIS — Z888 Allergy status to other drugs, medicaments and biological substances status: Secondary | ICD-10-CM | POA: Insufficient documentation

## 2015-08-24 DIAGNOSIS — K8020 Calculus of gallbladder without cholecystitis without obstruction: Secondary | ICD-10-CM | POA: Diagnosis present

## 2015-08-24 DIAGNOSIS — Z91038 Other insect allergy status: Secondary | ICD-10-CM | POA: Insufficient documentation

## 2015-08-24 DIAGNOSIS — R1110 Vomiting, unspecified: Secondary | ICD-10-CM

## 2015-08-24 DIAGNOSIS — L409 Psoriasis, unspecified: Secondary | ICD-10-CM | POA: Diagnosis not present

## 2015-08-24 DIAGNOSIS — R079 Chest pain, unspecified: Secondary | ICD-10-CM

## 2015-08-24 DIAGNOSIS — R109 Unspecified abdominal pain: Secondary | ICD-10-CM

## 2015-08-24 LAB — URINALYSIS, ROUTINE W REFLEX MICROSCOPIC
Bilirubin Urine: NEGATIVE
Glucose, UA: NEGATIVE mg/dL
Hgb urine dipstick: NEGATIVE
Ketones, ur: NEGATIVE mg/dL
Leukocytes, UA: NEGATIVE
Nitrite: NEGATIVE
Specific Gravity, Urine: 1.015 (ref 1.005–1.030)
pH: 6 (ref 5.0–8.0)

## 2015-08-24 LAB — COMPREHENSIVE METABOLIC PANEL
ALT: 75 U/L — ABNORMAL HIGH (ref 17–63)
AST: 44 U/L — ABNORMAL HIGH (ref 15–41)
Albumin: 5.2 g/dL — ABNORMAL HIGH (ref 3.5–5.0)
Alkaline Phosphatase: 100 U/L (ref 38–126)
Anion gap: 14 (ref 5–15)
BUN: 11 mg/dL (ref 6–20)
CO2: 20 mmol/L — ABNORMAL LOW (ref 22–32)
Calcium: 9.4 mg/dL (ref 8.9–10.3)
Chloride: 105 mmol/L (ref 101–111)
Creatinine, Ser: 1.48 mg/dL — ABNORMAL HIGH (ref 0.61–1.24)
GFR calc Af Amer: >60 mL/min (ref >60)
GFR calc non Af Amer: 54 mL/min — ABNORMAL LOW (ref >60)
Glucose, Bld: 168 mg/dL — ABNORMAL HIGH (ref 65–99)
Potassium: 3.4 mmol/L — ABNORMAL LOW (ref 3.5–5.1)
Sodium: 139 mmol/L (ref 135–145)
Total Bilirubin: 0.9 mg/dL (ref 0.3–1.2)
Total Protein: 8.9 g/dL — ABNORMAL HIGH (ref 6.5–8.1)

## 2015-08-24 LAB — LIPASE, BLOOD: Lipase: 24 U/L (ref 11–51)

## 2015-08-24 LAB — URINE MICROSCOPIC-ADD ON
Bacteria, UA: NONE SEEN
RBC / HPF: NONE SEEN RBC/hpf (ref 0–5)
Squamous Epithelial / LPF: NONE SEEN
WBC, UA: NONE SEEN WBC/hpf (ref 0–5)

## 2015-08-24 LAB — CBC WITH DIFFERENTIAL/PLATELET
Basophils Absolute: 0 10*3/uL (ref 0.0–0.1)
Basophils Relative: 0 %
Eosinophils Absolute: 0.3 10*3/uL (ref 0.0–0.7)
Eosinophils Relative: 1 %
HCT: 51.5 % (ref 39.0–52.0)
Hemoglobin: 18.3 g/dL — ABNORMAL HIGH (ref 13.0–17.0)
Lymphocytes Relative: 9 %
Lymphs Abs: 2.1 10*3/uL (ref 0.7–4.0)
MCH: 33.1 pg (ref 26.0–34.0)
MCHC: 35.5 g/dL (ref 30.0–36.0)
MCV: 93.1 fL (ref 78.0–100.0)
Monocytes Absolute: 1.5 10*3/uL — ABNORMAL HIGH (ref 0.1–1.0)
Monocytes Relative: 7 %
Neutro Abs: 19.1 10*3/uL — ABNORMAL HIGH (ref 1.7–7.7)
Neutrophils Relative %: 83 %
Platelets: 324 10*3/uL (ref 150–400)
RBC: 5.53 MIL/uL (ref 4.22–5.81)
RDW: 12.8 % (ref 11.5–15.5)
WBC: 23.1 10*3/uL — ABNORMAL HIGH (ref 4.0–10.5)

## 2015-08-24 LAB — C DIFFICILE QUICK SCREEN W PCR REFLEX
C Diff antigen: NEGATIVE
C Diff interpretation: NEGATIVE
C Diff toxin: NEGATIVE

## 2015-08-24 LAB — TROPONIN I
Troponin I: <0.03 ng/mL (ref <0.031)
Troponin I: <0.03 ng/mL (ref <0.031)

## 2015-08-24 LAB — TSH: TSH: 0.067 u[IU]/mL — ABNORMAL LOW (ref 0.350–4.500)

## 2015-08-24 MED ORDER — DIPHENHYDRAMINE HCL 50 MG/ML IJ SOLN
25.0000 mg | Freq: Once | INTRAMUSCULAR | Status: AC
  Administered 2015-08-24: 25 mg via INTRAVENOUS

## 2015-08-24 MED ORDER — SODIUM CHLORIDE 0.9 % IV SOLN
3.0000 g | Freq: Once | INTRAVENOUS | Status: AC
  Administered 2015-08-24: 3 g via INTRAVENOUS
  Filled 2015-08-24: qty 3

## 2015-08-24 MED ORDER — SODIUM CHLORIDE 0.9 % IV BOLUS (SEPSIS)
1000.0000 mL | Freq: Once | INTRAVENOUS | Status: AC
  Administered 2015-08-24: 1000 mL via INTRAVENOUS

## 2015-08-24 MED ORDER — LEVOTHYROXINE SODIUM 100 MCG PO TABS
300.0000 ug | ORAL_TABLET | Freq: Every day | ORAL | Status: DC
  Administered 2015-08-24 – 2015-08-26 (×3): 300 ug via ORAL
  Filled 2015-08-24 (×3): qty 3

## 2015-08-24 MED ORDER — ONDANSETRON HCL 4 MG/2ML IJ SOLN
4.0000 mg | Freq: Four times a day (QID) | INTRAMUSCULAR | Status: DC | PRN
  Administered 2015-08-25 – 2015-08-26 (×3): 4 mg via INTRAVENOUS
  Filled 2015-08-24 (×3): qty 2

## 2015-08-24 MED ORDER — ONDANSETRON HCL 4 MG/2ML IJ SOLN
4.0000 mg | Freq: Once | INTRAMUSCULAR | Status: AC
  Administered 2015-08-24: 4 mg via INTRAVENOUS
  Filled 2015-08-24: qty 2

## 2015-08-24 MED ORDER — ESCITALOPRAM OXALATE 10 MG PO TABS
10.0000 mg | ORAL_TABLET | Freq: Every day | ORAL | Status: DC
  Administered 2015-08-24 – 2015-08-26 (×3): 10 mg via ORAL
  Filled 2015-08-24 (×3): qty 1

## 2015-08-24 MED ORDER — DIPHENHYDRAMINE HCL 50 MG/ML IJ SOLN
INTRAMUSCULAR | Status: AC
  Filled 2015-08-24: qty 1

## 2015-08-24 MED ORDER — FENTANYL CITRATE (PF) 100 MCG/2ML IJ SOLN
100.0000 ug | Freq: Once | INTRAMUSCULAR | Status: AC
  Administered 2015-08-24: 100 ug via INTRAVENOUS
  Filled 2015-08-24: qty 2

## 2015-08-24 MED ORDER — SODIUM CHLORIDE 0.9 % IV SOLN
1.5000 g | Freq: Three times a day (TID) | INTRAVENOUS | Status: DC
  Administered 2015-08-24 – 2015-08-25 (×5): 1.5 g via INTRAVENOUS
  Filled 2015-08-24 (×7): qty 1.5

## 2015-08-24 MED ORDER — METOCLOPRAMIDE HCL 5 MG/ML IJ SOLN
INTRAMUSCULAR | Status: AC
  Filled 2015-08-24: qty 2

## 2015-08-24 MED ORDER — FENTANYL CITRATE (PF) 100 MCG/2ML IJ SOLN
50.0000 ug | Freq: Once | INTRAMUSCULAR | Status: AC
  Administered 2015-08-24: 50 ug via INTRAVENOUS
  Filled 2015-08-24: qty 2

## 2015-08-24 MED ORDER — PANTOPRAZOLE SODIUM 40 MG PO TBEC
40.0000 mg | DELAYED_RELEASE_TABLET | Freq: Every day | ORAL | Status: DC
  Administered 2015-08-24 – 2015-08-26 (×3): 40 mg via ORAL
  Filled 2015-08-24 (×3): qty 1

## 2015-08-24 MED ORDER — HEPARIN SODIUM (PORCINE) 5000 UNIT/ML IJ SOLN
5000.0000 [IU] | Freq: Three times a day (TID) | INTRAMUSCULAR | Status: DC
  Administered 2015-08-24 – 2015-08-26 (×6): 5000 [IU] via SUBCUTANEOUS
  Filled 2015-08-24 (×5): qty 1

## 2015-08-24 MED ORDER — KCL IN DEXTROSE-NACL 20-5-0.9 MEQ/L-%-% IV SOLN
INTRAVENOUS | Status: DC
  Administered 2015-08-24 – 2015-08-26 (×3): via INTRAVENOUS

## 2015-08-24 MED ORDER — HYDROMORPHONE HCL 1 MG/ML IJ SOLN
1.0000 mg | INTRAMUSCULAR | Status: DC | PRN
  Administered 2015-08-24 – 2015-08-26 (×7): 1 mg via INTRAVENOUS
  Filled 2015-08-24 (×7): qty 1

## 2015-08-24 MED ORDER — METOCLOPRAMIDE HCL 5 MG/ML IJ SOLN
10.0000 mg | Freq: Once | INTRAMUSCULAR | Status: AC
  Administered 2015-08-24: 10 mg via INTRAVENOUS

## 2015-08-24 MED ORDER — ZOLPIDEM TARTRATE 5 MG PO TABS
10.0000 mg | ORAL_TABLET | Freq: Every day | ORAL | Status: DC
  Administered 2015-08-24 – 2015-08-25 (×2): 10 mg via ORAL
  Filled 2015-08-24 (×2): qty 2

## 2015-08-24 MED ORDER — METOCLOPRAMIDE HCL 5 MG/ML IJ SOLN
10.0000 mg | Freq: Once | INTRAMUSCULAR | Status: AC
  Administered 2015-08-24: 10 mg via INTRAVENOUS
  Filled 2015-08-24: qty 2

## 2015-08-24 MED ORDER — IRBESARTAN 75 MG PO TABS
37.5000 mg | ORAL_TABLET | Freq: Every day | ORAL | Status: DC
  Administered 2015-08-25 – 2015-08-26 (×2): 37.5 mg via ORAL
  Filled 2015-08-24 (×2): qty 1

## 2015-08-24 MED ORDER — ONDANSETRON HCL 4 MG PO TABS
4.0000 mg | ORAL_TABLET | Freq: Four times a day (QID) | ORAL | Status: DC | PRN
  Administered 2015-08-24 (×2): 4 mg via ORAL
  Filled 2015-08-24 (×2): qty 1

## 2015-08-24 NOTE — Consult Note (Signed)
Reason for Consult: cholelithiasis, abdominal pain, diarrhea Referring Physician:  Dr. Mikey College is an 50 y.o. male.  HPI:  Patient is a 50 year old white male who presented emergency room with worsening nausea, vomiting, abdominal pain, and diarrhea. He states that reading 2 hotdogs and fries from a vascular place 2 days ago is when he started having epigastric and abdominal discomfort. Since that time, he has developed diarrhea has had good to the bathroom multiple times. He presented the emergency room for further evaluation and treatment. Ultrasound the gallbladder revealed sludge with a single gallstone, but no evidence of cholecystitis. His liver enzyme tests show only mild elevation of his SGOT and SGPT. Total bilirubin is within normal limits. He does have a significant leukocytosis at 23,000. Since he was admitted, his had 2 more episodes of diarrhea. No blood is noted within the stool. He has nonspecific abdominal pain. He denies any fever or chills.  Past Medical History  Diagnosis Date  . CAD (coronary artery disease)   . Elevated liver enzymes   . Diverticulitis     Hx of; requiring 3 admissions  . Sigmoid colon ulcer     Rectal polyps  . Thyroid disease   . Psoriasis     Per medical history form dated 06/13/10.  . Chronic insomnia     Per medical history form dated 06/13/10.  . Colitis     Per medical history from dated 06/13/10.  Marland Kitchen Hypertension   . Hypothyroidism   . Bronchitis     history of  . Back pain     chronic  . Arthritis   . 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 Sleep apnea with use of continuous positive airway pressure (CPAP)     Past Surgical History  Procedure Laterality Date  . Colonoscopy  07/2008    Colitis,NSAID v. Ischemia,malrotation of the gut,Diverticulosis(L),hyperplastic  . Appendectomy      8th grade  . Shoulder surgery      Left  . Rotator cuff repair      Left - per medical history form dated 06/13/10.  .  Thyroidectomy      Per medical history form dated 06/13/10.  . Laparoscopic sigmoid colectomy  2012    Dr. Fanny Skates: recurrent sigmoid diverticulitis  . Total hip arthroplasty  07/04/2011    Procedure: TOTAL HIP ARTHROPLASTY;  Surgeon: Johnny Bridge, MD;  Location: [facility];  Service: Orthopedics;  Laterality: Right;  . Joint replacement    . Cardiac catheterization  2009  . Cardiac catheterization      Per medical history from dated 06/13/10.  . Colonoscopy N/A 12/13/2012    Dr. Oneida Alar: Normal TI, mild sigmoid diverticulosis, hemorrhoids, 2 polyps (tubular adenoma). Random colon bx negative. Next TCS 12/2022 with Fentanyl/phenergan  . Esophagogastroduodenoscopy N/A 04/16/2014    RMR: Erosive reflux esophagitis. Non critical Schzki's ring not manipulated. Small hiatal hernia. Abnormal gastirc mucosa of doubtful signigicance status post biopsy. I suspect trivial upper GI bleed. Recent abdominal pain presumably secondary to a protracted bout of diverticulitis. CT scan November 23 revealed improvemetn without complication. He finished his antibiotics 2 days age. Left sided ab  . Cervical spine surgery      C4, C5, C6 spinal fusion    Family History  Problem Relation Age of Onset  . Cancer Mother     breast cancer - per medical history form dated 06/13/10.  . Diverticulitis Mother   . Hypertension Mother   .  Cancer Father     skin - per medical history form dated 06/13/10.  Marland Kitchen Hypertension Father   . Anesthesia problems Neg Hx   . Hypotension Neg Hx   . Malignant hyperthermia Neg Hx   . Pseudochol deficiency Neg Hx   . Colon cancer Neg Hx     Social History:  reports that he has been smoking Cigarettes.  He has a 9 pack-year smoking history. He has never used smokeless tobacco. He reports that he drinks alcohol. He reports that he does not use illicit drugs.  Allergies:  Allergies  Allergen Reactions  . Other Hives, Shortness Of Breath and Itching    Insect bite- bug name unknown=  .  Iohexol Hives    Medications: I have reviewed the patient's current medications.  Results for orders placed or performed during the hospital encounter of 08/24/15 (from the past 48 hour(s))  Comprehensive metabolic panel     Status: Abnormal   Collection Time: 08/24/15  1:00 AM  Result Value Ref Range   Sodium 139 135 - 145 mmol/L   Potassium 3.4 (L) 3.5 - 5.1 mmol/L   Chloride 105 101 - 111 mmol/L   CO2 20 (L) 22 - 32 mmol/L   Glucose, Bld 168 (H) 65 - 99 mg/dL   BUN 11 6 - 20 mg/dL   Creatinine, Ser 1.48 (H) 0.61 - 1.24 mg/dL   Calcium 9.4 8.9 - 10.3 mg/dL   Total Protein 8.9 (H) 6.5 - 8.1 g/dL   Albumin 5.2 (H) 3.5 - 5.0 g/dL   AST 44 (H) 15 - 41 U/L   ALT 75 (H) 17 - 63 U/L   Alkaline Phosphatase 100 38 - 126 U/L   Total Bilirubin 0.9 0.3 - 1.2 mg/dL   GFR calc non Af Amer 54 (L) >60 mL/min   GFR calc Af Amer >60 >60 mL/min    Comment: (NOTE) The eGFR has been calculated using the CKD EPI equation. This calculation has not been validated in all clinical situations. eGFR's persistently <60 mL/min signify possible Chronic Kidney Disease.    Anion gap 14 5 - 15  CBC with Differential     Status: Abnormal   Collection Time: 08/24/15  1:00 AM  Result Value Ref Range   WBC 23.1 (H) 4.0 - 10.5 K/uL   RBC 5.53 4.22 - 5.81 MIL/uL   Hemoglobin 18.3 (H) 13.0 - 17.0 g/dL   HCT 51.5 39.0 - 52.0 %   MCV 93.1 78.0 - 100.0 fL   MCH 33.1 26.0 - 34.0 pg   MCHC 35.5 30.0 - 36.0 g/dL   RDW 12.8 11.5 - 15.5 %   Platelets 324 150 - 400 K/uL   Neutrophils Relative % 83 %   Neutro Abs 19.1 (H) 1.7 - 7.7 K/uL   Lymphocytes Relative 9 %   Lymphs Abs 2.1 0.7 - 4.0 K/uL   Monocytes Relative 7 %   Monocytes Absolute 1.5 (H) 0.1 - 1.0 K/uL   Eosinophils Relative 1 %   Eosinophils Absolute 0.3 0.0 - 0.7 K/uL   Basophils Relative 0 %   Basophils Absolute 0.0 0.0 - 0.1 K/uL  Troponin I     Status: None   Collection Time: 08/24/15  1:00 AM  Result Value Ref Range   Troponin I <0.03 <0.031  ng/mL    Comment:        NO INDICATION OF MYOCARDIAL INJURY.   Lipase, blood     Status: None   Collection Time: 08/24/15  1:00 AM  Result Value Ref Range   Lipase 24 11 - 51 U/L  TSH     Status: Abnormal   Collection Time: 08/24/15  1:00 AM  Result Value Ref Range   TSH 0.067 (L) 0.350 - 4.500 uIU/mL  Troponin I     Status: None   Collection Time: 08/24/15  5:18 AM  Result Value Ref Range   Troponin I <0.03 <0.031 ng/mL    Comment:        NO INDICATION OF MYOCARDIAL INJURY.   Urinalysis, Routine w reflex microscopic     Status: Abnormal   Collection Time: 08/24/15  5:57 AM  Result Value Ref Range   Color, Urine YELLOW YELLOW   APPearance CLEAR CLEAR   Specific Gravity, Urine 1.015 1.005 - 1.030   pH 6.0 5.0 - 8.0   Glucose, UA NEGATIVE NEGATIVE mg/dL   Hgb urine dipstick NEGATIVE NEGATIVE   Bilirubin Urine NEGATIVE NEGATIVE   Ketones, ur NEGATIVE NEGATIVE mg/dL   Protein, ur TRACE (A) NEGATIVE mg/dL   Nitrite NEGATIVE NEGATIVE   Leukocytes, UA NEGATIVE NEGATIVE  Urine microscopic-add on     Status: Abnormal   Collection Time: 08/24/15  5:57 AM  Result Value Ref Range   Squamous Epithelial / LPF NONE SEEN NONE SEEN   WBC, UA NONE SEEN 0 - 5 WBC/hpf   RBC / HPF NONE SEEN 0 - 5 RBC/hpf   Bacteria, UA NONE SEEN NONE SEEN   Casts GRANULAR CAST (A) NEGATIVE  Culture, blood (Routine X 2) w Reflex to ID Panel     Status: None (Preliminary result)   Collection Time: 08/24/15  9:40 AM  Result Value Ref Range   Specimen Description BLOOD LEFT HAND    Special Requests NONE 4 CC EACH    Culture NO GROWTH <12 HOURS    Report Status PENDING   Culture, blood (Routine X 2) w Reflex to ID Panel     Status: None (Preliminary result)   Collection Time: 08/24/15  9:53 AM  Result Value Ref Range   Specimen Description BLOOD LEFT ARM    Special Requests NONE 8 CC EACH    Culture NO GROWTH <12 HOURS    Report Status PENDING     Ct Abdomen Pelvis Wo Contrast  08/24/2015  CLINICAL  DATA:  Chest pain under the left breast an radiating to the mid chest with onset 12 hours ago. Diaphoresis, nausea, vomiting, abdominal distention, left back pain, cough, shortness of breath, dyspnea on exertion cava and diarrhea. Vomiting. EXAM: CT CHEST, ABDOMEN AND PELVIS WITHOUT CONTRAST TECHNIQUE: Multidetector CT imaging of the chest, abdomen and pelvis was performed following the standard protocol without IV contrast. Evaluation of solid organs and vascular structures is limited without IV contrast material. COMPARISON:  None. FINDINGS: CT CHEST FINDINGS Mediastinum/Lymph Nodes: No masses or pathologically enlarged lymph nodes identified on this un-enhanced exam. Surgical absence of the thyroid gland. Lungs/Pleura: No pulmonary mass, infiltrate, or effusion. Musculoskeletal: No chest wall mass or suspicious bone lesions identified. CT ABDOMEN PELVIS FINDINGS Hepatobiliary: No mass visualized on this un-enhanced exam. Tiny stone seen in the gallbladder. No bile duct dilatation. Pancreas: No mass or inflammatory process identified on this un-enhanced exam. Spleen: Within normal limits in size. Adrenals/Urinary Tract: No evidence of urolithiasis or hydronephrosis. No definite mass visualized on this un-enhanced exam. Stomach/Bowel: No evidence of obstruction, inflammatory process, or abnormal fluid collections. Vascular/Lymphatic: No pathologically enlarged lymph nodes. No evidence of abdominal aortic aneurysm. Reproductive: No mass  or other significant abnormality. Other: None. Musculoskeletal: No suspicious bone lesions identified. Right hip arthroplasty. IMPRESSION: No acute process demonstrated in the chest, abdomen, or pelvis on noncontrast imaging. Electronically Signed   By: Lucienne Capers M.D.   On: 08/24/2015 04:55   Ct Chest Wo Contrast  08/24/2015  CLINICAL DATA:  Chest pain under the left breast an radiating to the mid chest with onset 12 hours ago. Diaphoresis, nausea, vomiting, abdominal  distention, left back pain, cough, shortness of breath, dyspnea on exertion cava and diarrhea. Vomiting. EXAM: CT CHEST, ABDOMEN AND PELVIS WITHOUT CONTRAST TECHNIQUE: Multidetector CT imaging of the chest, abdomen and pelvis was performed following the standard protocol without IV contrast. Evaluation of solid organs and vascular structures is limited without IV contrast material. COMPARISON:  None. FINDINGS: CT CHEST FINDINGS Mediastinum/Lymph Nodes: No masses or pathologically enlarged lymph nodes identified on this un-enhanced exam. Surgical absence of the thyroid gland. Lungs/Pleura: No pulmonary mass, infiltrate, or effusion. Musculoskeletal: No chest wall mass or suspicious bone lesions identified. CT ABDOMEN PELVIS FINDINGS Hepatobiliary: No mass visualized on this un-enhanced exam. Tiny stone seen in the gallbladder. No bile duct dilatation. Pancreas: No mass or inflammatory process identified on this un-enhanced exam. Spleen: Within normal limits in size. Adrenals/Urinary Tract: No evidence of urolithiasis or hydronephrosis. No definite mass visualized on this un-enhanced exam. Stomach/Bowel: No evidence of obstruction, inflammatory process, or abnormal fluid collections. Vascular/Lymphatic: No pathologically enlarged lymph nodes. No evidence of abdominal aortic aneurysm. Reproductive: No mass or other significant abnormality. Other: None. Musculoskeletal: No suspicious bone lesions identified. Right hip arthroplasty. IMPRESSION: No acute process demonstrated in the chest, abdomen, or pelvis on noncontrast imaging. Electronically Signed   By: Lucienne Capers M.D.   On: 08/24/2015 04:55   US Abdomen Limited Ruq  08/24/2015  CLINICAL DATA:  Nausea for 12 hours with diarrhea. EXAM: US ABDOMEN LIMITED - RIGHT UPPER QUADRANT COMPARISON:  CT 08/24/2015. FINDINGS: Gallbladder: There is gallbladder sludge and a tiny non shadowing calculus. No gallbladder wall thickening or sonographic Murphy's sign. Common  bile duct: Diameter: 5.5 mm Liver: The hepatic echogenicity is increased, corresponding with decreased density on CT, consistent with steatosis. No focal abnormalities identified. IMPRESSION: 1. No acute right upper quadrant abdominal findings. 2. Gallbladder sludge and small gallstone. No evidence of cholecystitis or biliary dilatation. 3. Hepatic steatosis. Electronically Signed   By: Richardean Sale M.D.   On: 08/24/2015 07:57    ROS:  See chart  Blood pressure 114/71, pulse 81, temperature 98.7 F (37.1 C), temperature source Oral, resp. rate 18, height 6' 1"  (1.854 m), weight 106.595 kg (235 lb), SpO2 95 %. Physical Exam:  Pleasant white male who looks fatigued , but in no acute distress.  Abdomen is soft with no significant distention noted. He does not have any point tenderness present, though he does have discomfort in the right upper quadrant. No rigidity is noted.  Assessment/Plan:  impression: diarrhea and abdominal pain most likely secondary to gastroenteritis from food. He does have cholelithiasis, but I doubt this is the etiology of his symptoms. No need for acute surgical intervention is time. May advance diet as tolerated. Would suggest C. Difficile [place], diarrhea stool studies, and possible hepatitis screen.  Kyah Buesing A 08/24/2015, 2:07 PM

## 2015-08-24 NOTE — ED Notes (Signed)
Pt given water. Pt tolerating well at this time.

## 2015-08-24 NOTE — H&P (Signed)
[facility] History and Physical  Andrew Love [id number] DOB: 1966/02/11    PCP:   [name], MD   Chief Complaint: abdominal pain and diarrhea.   HPI: Andrew Love is an 50 y.o. male with hx of cholelithiasis, chronic back pain and neck pain, s/p surgery, hx of diverticulitis, presented to the ER with RUQ pain and atypical chest pain.  He also has a week of diarrhea, with some subjective fever, but no chills.  He has had no black or bloody stool.  He did not recall any recent antibiotic use.  Evaluation in the ER included a leukocytosis with WBC of 23K, negative abdominal pelvic CT, and RUQ showing sludge and gallstone, but no evidence of acute cholecystitis.  He has a negative UA.  Surgery was consulted, and hospitalist was asked to admit him for abdominal pain and for controlling of pain.   Rewiew of Systems:  Constitutional: Negative for malaise, fever and chills. No significant weight loss or weight gain Eyes: Negative for eye pain, redness and discharge, diplopia, visual changes, or flashes of light. ENMT: Negative for ear pain, hoarseness, nasal congestion, sinus pressure and sore throat. No headaches; tinnitus, drooling, or problem swallowing. Cardiovascular: Negative for chest pain, palpitations, diaphoresis, dyspnea and peripheral edema. ; No orthopnea, PND Respiratory: Negative for cough, hemoptysis, wheezing and stridor. No pleuritic chestpain. Gastrointestinal: Negative for nausea, vomiting, diarrhea, constipation, melena, blood in stool, hematemesis, jaundice and rectal bleeding.    Genitourinary: Negative for frequency, dysuria, incontinence,flank pain and hematuria; Musculoskeletal: Negative for back pain and neck pain. Negative for swelling and trauma.;  Skin: . Negative for pruritus, rash, abrasions, bruising and skin lesion.; ulcerations Neuro: Negative for headache, lightheadedness and neck stiffness. Negative for weakness, altered level of  consciousness , altered mental status, extremity weakness, burning feet, involuntary movement, seizure and syncope.  Psych: negative for anxiety, depression, insomnia, tearfulness, panic attacks, hallucinations, paranoia, suicidal or homicidal ideation    Past Medical History  Diagnosis Date  . CAD (coronary artery disease)   . Elevated liver enzymes   . Diverticulitis     Hx of; requiring 3 admissions  . Sigmoid colon ulcer     Rectal polyps  . Thyroid disease   . Psoriasis     Per medical history form dated 06/13/10.  . Chronic insomnia     Per medical history form dated 06/13/10.  . Colitis     Per medical history from dated 06/13/10.  Marland Kitchen Hypertension   . Hypothyroidism   . Bronchitis     history of  . Back pain     chronic  . Arthritis   . 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 Sleep apnea with use of continuous positive airway pressure (CPAP)     Past Surgical History  Procedure Laterality Date  . Colonoscopy  07/2008    Colitis,NSAID v. Ischemia,malrotation of the gut,Diverticulosis(L),hyperplastic  . Appendectomy      8th grade  . Shoulder surgery      Left  . Rotator cuff repair      Left - per medical history form dated 06/13/10.  . Thyroidectomy      Per medical history form dated 06/13/10.  . Laparoscopic sigmoid colectomy  2012    Dr. Fanny Skates: recurrent sigmoid diverticulitis  . Total hip arthroplasty  07/04/2011    Procedure: TOTAL HIP ARTHROPLASTY;  Surgeon: Johnny Bridge, MD;  Location: [place];  Service: Orthopedics;  Laterality: Right;  . Joint replacement    .  Cardiac catheterization  2009  . Cardiac catheterization      Per medical history from dated 06/13/10.  . Colonoscopy N/A 12/13/2012    Dr. Oneida Alar: Normal TI, mild sigmoid diverticulosis, hemorrhoids, 2 polyps (tubular adenoma). Random colon bx negative. Next TCS 12/2022 with Fentanyl/phenergan  . Esophagogastroduodenoscopy N/A 04/16/2014    RMR: Erosive reflux esophagitis. Non  critical Schzki's ring not manipulated. Small hiatal hernia. Abnormal gastirc mucosa of doubtful signigicance status post biopsy. I suspect trivial upper GI bleed. Recent abdominal pain presumably secondary to a protracted bout of diverticulitis. CT scan November 23 revealed improvemetn without complication. He finished his antibiotics 2 days age. Left sided ab  . Cervical spine surgery      C4, C5, C6 spinal fusion    Medications:  HOME MEDS: Prior to Admission medications   Medication Sig Start Date End Date Taking? Authorizing Provider  cyclobenzaprine (FLEXERIL) 10 MG tablet Take 1 tablet (10 mg total) by mouth 3 (three) times daily as needed. Patient taking differently: Take 10 mg by mouth 3 (three) times daily as needed for muscle spasms.  04/27/15  Yes Rolland Porter, MD  diphenhydrAMINE (BENADRYL) 25 MG tablet Take 50 mg by mouth once as needed for allergies.   Yes Historical Provider, MD  EPINEPHrine (EPIPEN) 0.3 mg/0.3 mL SOAJ Inject 0.3 mLs (0.3 mg total) into the muscle as needed. Patient taking differently: Inject 0.3 mg into the muscle daily as needed (allergic reaction).  12/01/12  Yes Teressa Lower, MD  escitalopram (LEXAPRO) 10 MG tablet Take 10 mg by mouth daily. 04/28/15  Yes Historical Provider, MD  levothyroxine (SYNTHROID, LEVOTHROID) 300 MCG tablet Take 300 mcg by mouth daily. 03/26/14  Yes Historical Provider, MD  meloxicam (MOBIC) 7.5 MG tablet Take 7.5 mg by mouth 2 (two) times daily. 08/10/15  Yes Historical Provider, MD  olmesartan (BENICAR) 20 MG tablet Take 20 mg by mouth daily.    Yes Historical Provider, MD  ondansetron (ZOFRAN) 4 MG tablet Take 1 tablet (4 mg total) by mouth every 6 (six) hours. 06/01/15  Yes Hanna Patel-Mills, PA-C  oxyCODONE-acetaminophen (PERCOCET) 10-325 MG tablet Take 0.5-1 tablets by mouth daily as needed for pain.   Yes Historical Provider, MD  oxymetazoline (AFRIN) 0.05 % nasal spray Place 1 spray into both nostrils 2 (two) times daily as needed for  congestion.   Yes Historical Provider, MD  pantoprazole (PROTONIX) 40 MG tablet Take 1 tablet (40 mg total) by mouth 2 (two) times daily before a meal. Take 1 tablet by mouth 2 time daily before a meal for 30 days then take 1 tab daily before a meal Patient taking differently: Take 40 mg by mouth daily. Take 1 tablet by mouth 2 time daily before a meal for 30 days then take 1 tab daily before a meal 07/15/14  Yes Danie Binder, MD  zolpidem (AMBIEN) 10 MG tablet Take 10 mg by mouth at bedtime.    Yes Historical Provider, MD  polyethylene glycol (MIRALAX / GLYCOLAX) packet Take 17 g by mouth daily. Patient not taking: Reported on 08/24/2015 06/01/15   Ottie Glazier, PA-C     Allergies:  Allergies  Allergen Reactions  . Other Hives, Shortness Of Breath and Itching    Insect bite- bug name unknown=  . Iohexol Hives    Social History:   reports that he has been smoking Cigarettes.  He has a 9 pack-year smoking history. He has never used smokeless tobacco. He reports that he drinks alcohol. He reports  that he does not use illicit drugs.  Family History: Family History  Problem Relation Age of Onset  . Cancer Mother     breast cancer - per medical history form dated 06/13/10.  . Diverticulitis Mother   . Hypertension Mother   . Cancer Father     skin - per medical history form dated 06/13/10.  Marland Kitchen Hypertension Father   . Anesthesia problems Neg Hx   . Hypotension Neg Hx   . Malignant hyperthermia Neg Hx   . Pseudochol deficiency Neg Hx   . Colon cancer Neg Hx      Physical Exam: Filed Vitals:   08/24/15 0800 08/24/15 0900 08/24/15 1005 08/24/15 1036  BP: 111/70 96/59 112/64   Pulse: 87 86 88   Temp:   98.5 F (36.9 C)   TempSrc:   Oral   Resp: 20 17 18    Height:    6\' 1"  (1.854 m)  Weight:    106.595 kg (235 lb)  SpO2: 92% 93% 94%    Blood pressure 112/64, pulse 88, temperature 98.5 F (36.9 C), temperature source Oral, resp. rate 18, height 6\' 1"  (1.854 m), weight 106.595 kg  (235 lb), SpO2 94 %.  GEN:  Pleasant patient lying in the stretcher in no acute distress; cooperative with exam. PSYCH:  alert and oriented x4; does not appear anxious or depressed; affect is appropriate. HEENT: Mucous membranes pink and anicteric; PERRLA; EOM intact; no cervical lymphadenopathy nor thyromegaly or carotid bruit; no JVD; There were no stridor. Neck is very supple. Breasts:: Not examined CHEST WALL: No tenderness CHEST: Normal respiration, clear to auscultation bilaterally.  HEART: Regular rate and rhythm.  There are no murmur, rub, or gallops.   BACK: No kyphosis or scoliosis; no CVA tenderness ABDOMEN: soft and RUQ tenderness, but no rebound.  no masses, no organomegaly, normal abdominal bowel sounds; no pannus; no intertriginous candida. There is no rebound and no distention. Rectal Exam: Not done EXTREMITIES: No bone or joint deformity; age-appropriate arthropathy of the hands and knees; no edema; no ulcerations.  There is no calf tenderness. Genitalia: not examined PULSES: 2+ and symmetric SKIN: Normal hydration no rash or ulceration CNS: Cranial nerves 2-12 grossly intact no focal lateralizing neurologic deficit.  Speech is fluent; uvula elevated with phonation, facial symmetry and tongue midline. DTR are normal bilaterally, cerebella exam is intact, barbinski is negative and strengths are equaled bilaterally.  No sensory loss.   Labs on Admission:  Basic Metabolic Panel:  Recent Labs Lab 08/24/15 0100  NA 139  K 3.4*  CL 105  CO2 20*  GLUCOSE 168*  BUN 11  CREATININE 1.48*  CALCIUM 9.4   Liver Function Tests:  Recent Labs Lab 08/24/15 0100  AST 44*  ALT 75*  ALKPHOS 100  BILITOT 0.9  PROT 8.9*  ALBUMIN 5.2*    Recent Labs Lab 08/24/15 0100  LIPASE 24   CBC:  Recent Labs Lab 08/24/15 0100  WBC 23.1*  NEUTROABS 19.1*  HGB 18.3*  HCT 51.5  MCV 93.1  PLT 324   Cardiac Enzymes:  Recent Labs Lab 08/24/15 0100 08/24/15 0518   TROPONINI <0.03 <0.03   Radiological Exams on Admission: Ct Abdomen Pelvis Wo Contrast  08/24/2015  CLINICAL DATA:  Chest pain under the left breast an radiating to the mid chest with onset 12 hours ago. Diaphoresis, nausea, vomiting, abdominal distention, left back pain, cough, shortness of breath, dyspnea on exertion cava and diarrhea. Vomiting. EXAM: CT CHEST, ABDOMEN AND PELVIS WITHOUT  CONTRAST TECHNIQUE: Multidetector CT imaging of the chest, abdomen and pelvis was performed following the standard protocol without IV contrast. Evaluation of solid organs and vascular structures is limited without IV contrast material. COMPARISON:  None. FINDINGS: CT CHEST FINDINGS Mediastinum/Lymph Nodes: No masses or pathologically enlarged lymph nodes identified on this un-enhanced exam. Surgical absence of the thyroid gland. Lungs/Pleura: No pulmonary mass, infiltrate, or effusion. Musculoskeletal: No chest wall mass or suspicious bone lesions identified. CT ABDOMEN PELVIS FINDINGS Hepatobiliary: No mass visualized on this un-enhanced exam. Tiny stone seen in the gallbladder. No bile duct dilatation. Pancreas: No mass or inflammatory process identified on this un-enhanced exam. Spleen: Within normal limits in size. Adrenals/Urinary Tract: No evidence of urolithiasis or hydronephrosis. No definite mass visualized on this un-enhanced exam. Stomach/Bowel: No evidence of obstruction, inflammatory process, or abnormal fluid collections. Vascular/Lymphatic: No pathologically enlarged lymph nodes. No evidence of abdominal aortic aneurysm. Reproductive: No mass or other significant abnormality. Other: None. Musculoskeletal: No suspicious bone lesions identified. Right hip arthroplasty. IMPRESSION: No acute process demonstrated in the chest, abdomen, or pelvis on noncontrast imaging. Electronically Signed   By: Lucienne Capers M.D.   On: 08/24/2015 04:55   Ct Chest Wo Contrast  08/24/2015  CLINICAL DATA:  Chest pain under  the left breast an radiating to the mid chest with onset 12 hours ago. Diaphoresis, nausea, vomiting, abdominal distention, left back pain, cough, shortness of breath, dyspnea on exertion cava and diarrhea. Vomiting. EXAM: CT CHEST, ABDOMEN AND PELVIS WITHOUT CONTRAST TECHNIQUE: Multidetector CT imaging of the chest, abdomen and pelvis was performed following the standard protocol without IV contrast. Evaluation of solid organs and vascular structures is limited without IV contrast material. COMPARISON:  None. FINDINGS: CT CHEST FINDINGS Mediastinum/Lymph Nodes: No masses or pathologically enlarged lymph nodes identified on this un-enhanced exam. Surgical absence of the thyroid gland. Lungs/Pleura: No pulmonary mass, infiltrate, or effusion. Musculoskeletal: No chest wall mass or suspicious bone lesions identified. CT ABDOMEN PELVIS FINDINGS Hepatobiliary: No mass visualized on this un-enhanced exam. Tiny stone seen in the gallbladder. No bile duct dilatation. Pancreas: No mass or inflammatory process identified on this un-enhanced exam. Spleen: Within normal limits in size. Adrenals/Urinary Tract: No evidence of urolithiasis or hydronephrosis. No definite mass visualized on this un-enhanced exam. Stomach/Bowel: No evidence of obstruction, inflammatory process, or abnormal fluid collections. Vascular/Lymphatic: No pathologically enlarged lymph nodes. No evidence of abdominal aortic aneurysm. Reproductive: No mass or other significant abnormality. Other: None. Musculoskeletal: No suspicious bone lesions identified. Right hip arthroplasty. IMPRESSION: No acute process demonstrated in the chest, abdomen, or pelvis on noncontrast imaging. Electronically Signed   By: Lucienne Capers M.D.   On: 08/24/2015 04:55   US Abdomen Limited Ruq  08/24/2015  CLINICAL DATA:  Nausea for 12 hours with diarrhea. EXAM: US ABDOMEN LIMITED - RIGHT UPPER QUADRANT COMPARISON:  CT 08/24/2015. FINDINGS: Gallbladder: There is gallbladder  sludge and a tiny non shadowing calculus. No gallbladder wall thickening or sonographic Murphy's sign. Common bile duct: Diameter: 5.5 mm Liver: The hepatic echogenicity is increased, corresponding with decreased density on CT, consistent with steatosis. No focal abnormalities identified. IMPRESSION: 1. No acute right upper quadrant abdominal findings. 2. Gallbladder sludge and small gallstone. No evidence of cholecystitis or biliary dilatation. 3. Hepatic steatosis. Electronically Signed   By: Richardean Sale M.D.   On: 08/24/2015 07:57    EKG: Independently reviewed.    Assessment/Plan Present on Admission:  . Cholelithiases . Cholecystitis, acute . Essential hypertension . CAD (coronary artery disease) .  Diarrhea . Acute cholecystitis  PLAN:  Abominal pain, gallstone and sludge, marked leukocytosis is concerning for acute cholecystitis.   [place] and CT did not show cholecysitis, but given this clinical scenario, will go ahead and continue his Unasyn.  He could have C diff with abdominal pain, leukocytosis, and diarrhea.  We ll check C diff and place him on precaution.   Surgery was consulted and will await further recommnedation.  Will give him IVF, pain meds and antibiotics.    Atypical chest pain:  Likely non cardiac.  He had cath with non obstructive CAD.  Troponins were negative and EKG was non acute.  Will follow.  Other plans as per orders. Code Status:FULL CODE.    Orvan Falconer, MD. FACP [facility] Pager [phone] 7pm to 7am.  08/24/2015, 10:46 AM

## 2015-08-24 NOTE — ED Provider Notes (Signed)
CSN: [id number]     Arrival date & time 08/24/15  0043 History  By signing my name below, I, Irene Pap, attest that this documentation has been prepared under the direction and in the presence of Rolland Porter, MD at 01:50 AM. Electronically Signed: Irene Pap, ED Scribe. 08/24/2015. 1:07 AM.    Chief Complaint  Patient presents with  . Chest Pain   The history is provided by the patient and the spouse. No language interpreter was used.  HPI Comments: Andrew Love is a 50 y.o. Male with a hx of CAD, thyroid disease, colitis, diverticulitis, and HTN who presents to the Emergency Department complaining of intermittent sharp, pressured chest pain under the left breast that radiates to the middle chest onset 12 hours ago. He reports pain episodes will last 20-30 minutes at a time. He reports worsening pain with walking. He reports associated diaphoresis, nausea, vomiting, abdominal distention, left upper back pain, cough, SOB, DOE and diarrhea. He started vomiting almost continuously for the past 2-3 hours. He reports hx of similar pain and he had a cardiac cath done. He had a 30% blockage. He was placed on Lovastatin and aspirin. He reports paternal family hx of heart disease at a young age. 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 Pt is a smoker, and smokes 4-5 cigarettes a day. He does not drink alcohol. He denies fever and abdominal pain.  He has had diarrhea about 4-5 times a day for the past 2 weeks after starting meloxicam.   PCP Dr Gerarda Fraction  Past Medical History  Diagnosis Date  . CAD (coronary artery disease)   . Elevated liver enzymes   . Diverticulitis     Hx of; requiring 3 admissions  . Sigmoid colon ulcer     Rectal polyps  . Thyroid disease   . Psoriasis     Per medical history form dated 06/13/10.  . Chronic insomnia     Per medical history form dated 06/13/10.  . Colitis     Per medical history from dated 06/13/10.  Marland Kitchen  Hypertension   . Hypothyroidism   . Bronchitis     history of  . Back pain     chronic  . Arthritis   . 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 Sleep apnea with use of continuous positive airway pressure (CPAP)    Past Surgical History  Procedure Laterality Date  . Colonoscopy  07/2008    Colitis,NSAID v. Ischemia,malrotation of the gut,Diverticulosis(L),hyperplastic  . Appendectomy      8th grade  . Shoulder surgery      Left  . Rotator cuff repair      Left - per medical history form dated 06/13/10.  . Thyroidectomy      Per medical history form dated 06/13/10.  . Laparoscopic sigmoid colectomy  2012    Dr. Fanny Skates: recurrent sigmoid diverticulitis  . Total hip arthroplasty  07/04/2011    Procedure: TOTAL HIP ARTHROPLASTY;  Surgeon: Johnny Bridge, MD;  Location: [facility];  Service: Orthopedics;  Laterality: Right;  . Joint replacement    . Cardiac catheterization  2009  . Cardiac catheterization      Per medical history from dated 06/13/10.  . Colonoscopy N/A 12/13/2012    Dr. Oneida Alar: Normal TI, mild sigmoid diverticulosis, hemorrhoids, 2 polyps (tubular adenoma). Random colon bx negative. Next TCS 12/2022 with Fentanyl/phenergan  . Esophagogastroduodenoscopy N/A  04/16/2014    RMR: Erosive reflux esophagitis. Non critical Schzki's ring not manipulated. Small hiatal hernia. Abnormal gastirc mucosa of doubtful signigicance status post biopsy. I suspect trivial upper GI bleed. Recent abdominal pain presumably secondary to a protracted bout of diverticulitis. CT scan November 23 revealed improvemetn without complication. He finished his antibiotics 2 days age. Left sided ab  . Cervical spine surgery      C4, C5, C6 spinal fusion   Family History  Problem Relation Age of Onset  . Cancer Mother     breast cancer - per medical history form dated 06/13/10.  . Diverticulitis Mother   . Hypertension Mother   . Cancer Father     skin - per medical history form dated  06/13/10.  Marland Kitchen Hypertension Father   . Anesthesia problems Neg Hx   . Hypotension Neg Hx   . Malignant hyperthermia Neg Hx   . Pseudochol deficiency Neg Hx   . Colon cancer Neg Hx    Social History  Substance Use Topics  . Smoking status: Current Some Day Smoker -- 0.50 packs/day for 18 years    Types: Cigarettes    Last Attempt to Quit: 08/15/2012  . Smokeless tobacco: Never Used     Comment:    . Alcohol Use: 0.0 oz/week    0 Standard drinks or equivalent per week     Comment: Occasional  employed Lives with spouse  Review of Systems  Constitutional: Positive for diaphoresis. Negative for fever.  Respiratory: Positive for cough and shortness of breath.   Cardiovascular: Positive for chest pain.  Gastrointestinal: Positive for nausea, vomiting, diarrhea and abdominal distention. Negative for abdominal pain.  All other systems reviewed and are negative.  Allergies  Other and Iohexol  Home Medications   Prior to Admission medications   Medication Sig Start Date End Date Taking? Authorizing Provider  cyclobenzaprine (FLEXERIL) 10 MG tablet Take 1 tablet (10 mg total) by mouth 3 (three) times daily as needed. Patient taking differently: Take 10 mg by mouth 3 (three) times daily as needed for muscle spasms.  04/27/15   Rolland Porter, MD  diphenhydrAMINE (BENADRYL) 25 MG tablet Take 50 mg by mouth once as needed for allergies.    Historical Provider, MD  EPINEPHrine (EPIPEN) 0.3 mg/0.3 mL SOAJ Inject 0.3 mLs (0.3 mg total) into the muscle as needed. Patient taking differently: Inject 0.3 mg into the muscle daily as needed (allergic reaction).  12/01/12   Teressa Lower, MD  escitalopram (LEXAPRO) 10 MG tablet Take 10 mg by mouth daily. 04/28/15   Historical Provider, MD  levothyroxine (SYNTHROID, LEVOTHROID) 300 MCG tablet Take 300 mcg by mouth daily. 03/26/14   Historical Provider, MD  olmesartan (BENICAR) 20 MG tablet Take 20 mg by mouth daily.     Historical Provider, MD  ondansetron  (ZOFRAN) 4 MG tablet Take 1 tablet (4 mg total) by mouth every 6 (six) hours. 06/01/15   Hanna Patel-Mills, PA-C  oxyCODONE-acetaminophen (PERCOCET) 10-325 MG tablet Take 0.5-1 tablets by mouth daily as needed for pain.    Historical Provider, MD  oxymetazoline (AFRIN) 0.05 % nasal spray Place 1 spray into both nostrils 2 (two) times daily as needed for congestion.    Historical Provider, MD  pantoprazole (PROTONIX) 40 MG tablet Take 1 tablet (40 mg total) by mouth 2 (two) times daily before a meal. Take 1 tablet by mouth 2 time daily before a meal for 30 days then take 1 tab daily before a meal Patient taking  differently: Take 40 mg by mouth daily. Take 1 tablet by mouth 2 time daily before a meal for 30 days then take 1 tab daily before a meal 07/15/14   Danie Binder, MD  polyethylene glycol (MIRALAX / GLYCOLAX) packet Take 17 g by mouth daily. 06/01/15   Hanna Patel-Mills, PA-C  zolpidem (AMBIEN) 10 MG tablet Take 10 mg by mouth at bedtime.     Historical Provider, MD   BP 144/85 mmHg  Pulse 95  Temp(Src) 97.8 F (36.6 C)  Resp 22  Ht 6\' 1"  (1.854 m)  Wt 237 lb (107.502 kg)  BMI 31.27 kg/m2  SpO2 95%  Vital signs normal   Physical Exam  Constitutional: He is oriented to person, place, and time. He appears well-developed and well-nourished.  Non-toxic appearance. He does not appear ill. He appears distressed.  Actively vomiting on exam  HENT:  Head: Normocephalic and atraumatic.  Right Ear: External ear normal.  Left Ear: External ear normal.  Nose: Nose normal. No mucosal edema or rhinorrhea.  Mouth/Throat: Oropharynx is clear and moist and mucous membranes are normal. No dental abscesses or uvula swelling.  Eyes: Conjunctivae and EOM are normal. Pupils are equal, round, and reactive to light.  Neck: Normal range of motion and full passive range of motion without pain. Neck supple.  Cardiovascular: Normal rate, regular rhythm and normal heart sounds.  Exam reveals no gallop and no  friction rub.   No murmur heard. Pulmonary/Chest: Effort normal and breath sounds normal. No respiratory distress. He has no wheezes. He has no rhonchi. He has no rales. He exhibits no tenderness and no crepitus.  Abdominal: Soft. Normal appearance and bowel sounds are normal. He exhibits distension (mild). There is tenderness. There is no rebound and no guarding.  Diffuse abdominal tenderness  Musculoskeletal: Normal range of motion. He exhibits no edema or tenderness.  Moves all extremities well.   Neurological: He is alert and oriented to person, place, and time. He has normal strength. No cranial nerve deficit.  Skin: Skin is warm, dry and intact. No rash noted. No erythema. No pallor.  Psychiatric: He has a normal mood and affect. His speech is normal and behavior is normal. His mood appears not anxious.  Nursing note and vitals reviewed.   ED Course  Procedures (including critical care time)  Medications  fentaNYL (SUBLIMAZE) injection 50 mcg (not administered)  ondansetron (ZOFRAN) injection 4 mg (4 mg Intravenous Given 08/24/15 0107)  sodium chloride 0.9 % bolus 1,000 mL (0 mLs Intravenous Stopped 08/24/15 0237)  sodium chloride 0.9 % bolus 1,000 mL (0 mLs Intravenous Stopped 08/24/15 0237)  fentaNYL (SUBLIMAZE) injection 50 mcg (50 mcg Intravenous Given 08/24/15 0115)  ondansetron (ZOFRAN) injection 4 mg (4 mg Intravenous Given 08/24/15 0225)  metoCLOPramide (REGLAN) injection 10 mg (10 mg Intravenous Given 08/24/15 0336)  diphenhydrAMINE (BENADRYL) injection 25 mg (25 mg Intravenous Given 08/24/15 0336)    DIAGNOSTIC STUDIES: Oxygen Saturation is 95% on RA, adequate by my interpretation.    COORDINATION OF CARE: 1:06 AM-Discussed treatment plan which includes  (CXR, CBC panel, CMP, UA) with pt at bedside and pt agreed to plan.  Pt stopped vomiting after given zofran.   He was given fentanyl for pain and 2 L IV fluids.  2:20 AM nurse reports patient still having nausea. He was  given more Zofran.  2:30 AM we discussed his test results. At this point we decided to proceed with a CT scan of his chest and abdomen.  3:30 AM patient complains of more nausea. He was given Reglan and Benadryl for his nausea.  5 AM patient's CT results were discussed with patient and his wife. He states his nausea is gone but he starting to get some more pain and puts his hand over his right upper quadrant. He was given morphine for pain and we discussed getting a ultrasound of his gallbladder which is a more accurate test for gallbladder disease. However they will not be here until this morning. Patient is agreeable to wait to have the testing done. Patient was started on antibiotics while waiting for his ultrasound.  6 AM patient's second troponin is negative. He is waiting to get his ultrasound of his gallbladder done.  07:00 AM Pt left with Dr Christy Gentles to get the results of his RUQ [place].    Labs Review Results for orders placed or performed during the hospital encounter of 08/24/15  Comprehensive metabolic panel  Result Value Ref Range   Sodium 139 135 - 145 mmol/L   Potassium 3.4 (L) 3.5 - 5.1 mmol/L   Chloride 105 101 - 111 mmol/L   CO2 20 (L) 22 - 32 mmol/L   Glucose, Bld 168 (H) 65 - 99 mg/dL   BUN 11 6 - 20 mg/dL   Creatinine, Ser 1.48 (H) 0.61 - 1.24 mg/dL   Calcium 9.4 8.9 - 10.3 mg/dL   Total Protein 8.9 (H) 6.5 - 8.1 g/dL   Albumin 5.2 (H) 3.5 - 5.0 g/dL   AST 44 (H) 15 - 41 U/L   ALT 75 (H) 17 - 63 U/L   Alkaline Phosphatase 100 38 - 126 U/L   Total Bilirubin 0.9 0.3 - 1.2 mg/dL   GFR calc non Af Amer 54 (L) >60 mL/min   GFR calc Af Amer >60 >60 mL/min   Anion gap 14 5 - 15  CBC with Differential  Result Value Ref Range   WBC 23.1 (H) 4.0 - 10.5 K/uL   RBC 5.53 4.22 - 5.81 MIL/uL   Hemoglobin 18.3 (H) 13.0 - 17.0 g/dL   HCT 51.5 39.0 - 52.0 %   MCV 93.1 78.0 - 100.0 fL   MCH 33.1 26.0 - 34.0 pg   MCHC 35.5 30.0 - 36.0 g/dL   RDW 12.8 11.5 - 15.5 %   Platelets  324 150 - 400 K/uL   Neutrophils Relative % 83 %   Neutro Abs 19.1 (H) 1.7 - 7.7 K/uL   Lymphocytes Relative 9 %   Lymphs Abs 2.1 0.7 - 4.0 K/uL   Monocytes Relative 7 %   Monocytes Absolute 1.5 (H) 0.1 - 1.0 K/uL   Eosinophils Relative 1 %   Eosinophils Absolute 0.3 0.0 - 0.7 K/uL   Basophils Relative 0 %   Basophils Absolute 0.0 0.0 - 0.1 K/uL  Troponin I  Result Value Ref Range   Troponin I <0.03 <0.031 ng/mL  Lipase, blood  Result Value Ref Range   Lipase 24 11 - 51 U/L   Laboratory interpretation all normal except minor elevation of LFTs, leukocytosis     Imaging Review Ct Abdomen Pelvis Wo Contrast Ct Chest Wo Contrast  08/24/2015  CLINICAL DATA:  Chest pain under the left breast an radiating to the mid chest with onset 12 hours ago. Diaphoresis, nausea, vomiting, abdominal distention, left back pain, cough, shortness of breath, dyspnea on exertion cava and diarrhea. Vomiting. EXAM: CT CHEST, ABDOMEN AND PELVIS WITHOUT CONTRAST TECHNIQUE: Multidetector CT imaging of the chest, abdomen and pelvis was  performed following the standard protocol without IV contrast. Evaluation of solid organs and vascular structures is limited without IV contrast material. COMPARISON:  None. FINDINGS: CT CHEST FINDINGS Mediastinum/Lymph Nodes: No masses or pathologically enlarged lymph nodes identified on this un-enhanced exam. Surgical absence of the thyroid gland. Lungs/Pleura: No pulmonary mass, infiltrate, or effusion. Musculoskeletal: No chest wall mass or suspicious bone lesions identified. CT ABDOMEN PELVIS FINDINGS Hepatobiliary: No mass visualized on this un-enhanced exam. Tiny stone seen in the gallbladder. No bile duct dilatation. Pancreas: No mass or inflammatory process identified on this un-enhanced exam. Spleen: Within normal limits in size. Adrenals/Urinary Tract: No evidence of urolithiasis or hydronephrosis. No definite mass visualized on this un-enhanced exam. Stomach/Bowel: No evidence  of obstruction, inflammatory process, or abnormal fluid collections. Vascular/Lymphatic: No pathologically enlarged lymph nodes. No evidence of abdominal aortic aneurysm. Reproductive: No mass or other significant abnormality. Other: None. Musculoskeletal: No suspicious bone lesions identified. Right hip arthroplasty. IMPRESSION: No acute process demonstrated in the chest, abdomen, or pelvis on noncontrast imaging. Electronically Signed   By: Lucienne Capers M.D.   On: 08/24/2015 04:55   I have personally reviewed and evaluated these images and lab results as part of my medical decision-making.   EKG Interpretation   Date/Time:  Tuesday August 24 2015 00:51:14 EDT Ventricular Rate:  94 PR Interval:  133 QRS Duration: 86 QT Interval:  351 QTC Calculation: 439 R Axis:   28 Text Interpretation:  Sinus rhythm possible inferior infarch, age  undertermined No significant change since last tracing 10 Jul 2014  Confirmed by [facility]  MD-I, Jade Burkard ([id number]) on 08/24/2015 1:12:35 AM      MDM   Final diagnoses:  RUQ pain  Chest pain, unspecified chest pain type  Vomiting and diarrhea  Gallstone    Disposition pending  Rolland Porter, MD, FACEP   I personally performed the services described in this documentation, which was scribed in my presence. The recorded information has been reviewed and considered.  Rolland Porter, MD, Barbette Or, MD 08/24/15 [phone]

## 2015-08-24 NOTE — ED Notes (Signed)
Pt c/o chest pain intermittently with n/v/d.

## 2015-08-24 NOTE — ED Notes (Signed)
Pt c/o worsening nausea. Dr. Christy Gentles notified.

## 2015-08-24 NOTE — ED Notes (Signed)
In to medicate pt. Pt's BP 96/59. EDP aware. Fentanyl not given and returned to pyxis

## 2015-08-24 NOTE — ED Provider Notes (Signed)
I assumed care in signout to f/u on US imaging Pt with cholelithiasis D/w dr Arnoldo Morale, since pt has intractable pain/nausea, recommends admit to medicine D/w dr Marin Comment with [facility] admit to full medical bed Pt has already been given unasyn and he requests blood cultures Pt/family updated Also - pt has had two negative troponins and EKG reviewed as well   Ripley Fraise, MD 08/24/15 [phone]

## 2015-08-24 NOTE — Progress Notes (Signed)
Dr. Marin Comment notified via text page of patient's arrival to room 306.

## 2015-08-25 DIAGNOSIS — R197 Diarrhea, unspecified: Secondary | ICD-10-CM

## 2015-08-25 DIAGNOSIS — R1084 Generalized abdominal pain: Secondary | ICD-10-CM | POA: Diagnosis not present

## 2015-08-25 DIAGNOSIS — I10 Essential (primary) hypertension: Secondary | ICD-10-CM

## 2015-08-25 LAB — COMPREHENSIVE METABOLIC PANEL
ALT: 78 U/L — ABNORMAL HIGH (ref 17–63)
AST: 58 U/L — ABNORMAL HIGH (ref 15–41)
Albumin: 3.5 g/dL (ref 3.5–5.0)
Alkaline Phosphatase: 64 U/L (ref 38–126)
Anion gap: 5 (ref 5–15)
BUN: 9 mg/dL (ref 6–20)
CO2: 26 mmol/L (ref 22–32)
Calcium: 8.4 mg/dL — ABNORMAL LOW (ref 8.9–10.3)
Chloride: 108 mmol/L (ref 101–111)
Creatinine, Ser: 0.96 mg/dL (ref 0.61–1.24)
GFR calc Af Amer: >60 mL/min (ref >60)
GFR calc non Af Amer: >60 mL/min (ref >60)
Glucose, Bld: 102 mg/dL — ABNORMAL HIGH (ref 65–99)
Potassium: 3.7 mmol/L (ref 3.5–5.1)
Sodium: 139 mmol/L (ref 135–145)
Total Bilirubin: 0.8 mg/dL (ref 0.3–1.2)
Total Protein: 6.1 g/dL — ABNORMAL LOW (ref 6.5–8.1)

## 2015-08-25 LAB — CBC
HCT: 40.6 % (ref 39.0–52.0)
Hemoglobin: 13.7 g/dL (ref 13.0–17.0)
MCH: 32.1 pg (ref 26.0–34.0)
MCHC: 33.7 g/dL (ref 30.0–36.0)
MCV: 95.1 fL (ref 78.0–100.0)
Platelets: 150 10*3/uL (ref 150–400)
RBC: 4.27 MIL/uL (ref 4.22–5.81)
RDW: 12.7 % (ref 11.5–15.5)
WBC: 4.3 10*3/uL (ref 4.0–10.5)

## 2015-08-25 MED ORDER — ONDANSETRON 8 MG PO TBDP: 8.0000 mg | ORAL_TABLET | Freq: Three times a day (TID) | ORAL | Status: DC | PRN

## 2015-08-25 MED ORDER — POLYETHYLENE GLYCOL 3350 17 G PO PACK: 17.0000 g | PACK | Freq: Every day | ORAL | Status: DC | PRN

## 2015-08-25 NOTE — Discharge Summary (Addendum)
Physician Discharge Summary  Andrew Love [id number] DOB: March 11, 1966 DOA: 08/24/2015  PCP: [name], MD  Admit date: 08/24/2015 Discharge date: 08/26/2015  Time spent: 35 minutes  Recommendations for Outpatient Follow-up:  1. Follow up with PCP within 1-2 weeks for resolution of gastroenteritis. 2. Follow up with outpatient primary general surgery.  Discharge Diagnoses:  Active Problems:   CAD (coronary artery disease)   Essential hypertension   Cholelithiases   Abdominal pain   Diarrhea   Gastroenteritis   Discharge Condition: Improved   Diet recommendation: Heart healthy   Filed Weights   08/24/15 0048 08/24/15 1036  Weight: 107.502 kg (237 lb) 106.595 kg (235 lb)    History of present illness:  36 yom presented with complaints of worsening of nauseam vomiting, abdominal pain, and diarrhea after eating two days ago. U/S of the gallbladder in the ED revealed revealed sludge with a single gallstone, but no evidence of cholecystitis. His liver enzyme tests show only mild elevation of his SGOT and SGPT. Total bilirubin is within normal limits. He was admitted for treatment of abdominal pain and general surgery consult.   Hospital Course:  Patient was admitted for abdominal pain, nausea, vomiting, and diarrhea with initial concern for cholecystitis. General surgery was consulted and did not feel that cholecystitis was the main culprit. It was felt that patient likely had a viral gastroenteritis. Stool for C diff was negative. Patient quickly improved within 24 hours and appears to be back to baseline. He is tolerating a solid diet and is no longer having any nausea or vomiting. He remained afebrile and hemodynamically stable. If patient has any recurrence of symptoms with concerns for cholecystitis, he would prefer to follow up with his general surgeon, Dr. Dalbert Batman.  1. Atypical chest pain, ACS ruled out with negative cardiac markers and non-acute EKG.  2. CAD, Stable.   3. Essential HTN, stable.   Procedures:   None  Consultations:  General surgery   Discharge Exam: Filed Vitals:   08/26/15 0505 08/26/15 0926  BP: 128/74 123/70  Pulse: 59 61  Temp: 98.7 F (37.1 C)   Resp: 20     General: NAD Cardiovascular: RRR Respiratory: clear bilaterally, No wheezing, rales or rhonchi Abdomen: soft, non tender, no distention , bowel sounds normal Musculoskeletal: No edema b/l  Discharge Instructions   Discharge Instructions    Diet - low sodium heart healthy    Complete by:  As directed      Diet - low sodium heart healthy    Complete by:  As directed      Increase activity slowly    Complete by:  As directed      Increase activity slowly    Complete by:  As directed           Current Discharge Medication List    START taking these medications   Details  ondansetron (ZOFRAN ODT) 8 MG disintegrating tablet Take 1 tablet (8 mg total) by mouth every 8 (eight) hours as needed for nausea or vomiting. Qty: 20 tablet, Refills: 0      CONTINUE these medications which have CHANGED   Details  polyethylene glycol (MIRALAX / GLYCOLAX) packet Take 17 g by mouth daily as needed for mild constipation. Qty: 14 each, Refills: 0      CONTINUE these medications which have NOT CHANGED   Details  cyclobenzaprine (FLEXERIL) 10 MG tablet Take 1 tablet (10 mg total) by mouth 3 (three) times daily as needed. Qty: 40 tablet,  Refills: 0    diphenhydrAMINE (BENADRYL) 25 MG tablet Take 50 mg by mouth once as needed for allergies.    EPINEPHrine (EPIPEN) 0.3 mg/0.3 mL SOAJ Inject 0.3 mLs (0.3 mg total) into the muscle as needed. Qty: 1 Device, Refills: 3    escitalopram (LEXAPRO) 10 MG tablet Take 10 mg by mouth daily.    levothyroxine (SYNTHROID, LEVOTHROID) 300 MCG tablet Take 300 mcg by mouth daily.    meloxicam (MOBIC) 7.5 MG tablet Take 7.5 mg by mouth 2 (two) times daily.    olmesartan (BENICAR) 20 MG tablet Take 20 mg by mouth daily.      oxyCODONE-acetaminophen (PERCOCET) 10-325 MG tablet Take 0.5-1 tablets by mouth daily as needed for pain.    oxymetazoline (AFRIN) 0.05 % nasal spray Place 1 spray into both nostrils 2 (two) times daily as needed for congestion.    pantoprazole (PROTONIX) 40 MG tablet Take 1 tablet (40 mg total) by mouth 2 (two) times daily before a meal. Take 1 tablet by mouth 2 time daily before a meal for 30 days then take 1 tab daily before a meal Qty: 60 tablet, Refills: 11    zolpidem (AMBIEN) 10 MG tablet Take 10 mg by mouth at bedtime.       STOP taking these medications     ondansetron (ZOFRAN) 4 MG tablet        Allergies  Allergen Reactions  . Other Hives, Shortness Of Breath and Itching    Insect bite- bug name unknown=  . Iohexol Hives      The results of significant diagnostics from this hospitalization (including imaging, microbiology, ancillary and laboratory) are listed below for reference.    Significant Diagnostic Studies: Ct Abdomen Pelvis Wo Contrast  08/24/2015  CLINICAL DATA:  Chest pain under the left breast an radiating to the mid chest with onset 12 hours ago. Diaphoresis, nausea, vomiting, abdominal distention, left back pain, cough, shortness of breath, dyspnea on exertion cava and diarrhea. Vomiting. EXAM: CT CHEST, ABDOMEN AND PELVIS WITHOUT CONTRAST TECHNIQUE: Multidetector CT imaging of the chest, abdomen and pelvis was performed following the standard protocol without IV contrast. Evaluation of solid organs and vascular structures is limited without IV contrast material. COMPARISON:  None. FINDINGS: CT CHEST FINDINGS Mediastinum/Lymph Nodes: No masses or pathologically enlarged lymph nodes identified on this un-enhanced exam. Surgical absence of the thyroid gland. Lungs/Pleura: No pulmonary mass, infiltrate, or effusion. Musculoskeletal: No chest wall mass or suspicious bone lesions identified. CT ABDOMEN PELVIS FINDINGS Hepatobiliary: No mass visualized on this  un-enhanced exam. Tiny stone seen in the gallbladder. No bile duct dilatation. Pancreas: No mass or inflammatory process identified on this un-enhanced exam. Spleen: Within normal limits in size. Adrenals/Urinary Tract: No evidence of urolithiasis or hydronephrosis. No definite mass visualized on this un-enhanced exam. Stomach/Bowel: No evidence of obstruction, inflammatory process, or abnormal fluid collections. Vascular/Lymphatic: No pathologically enlarged lymph nodes. No evidence of abdominal aortic aneurysm. Reproductive: No mass or other significant abnormality. Other: None. Musculoskeletal: No suspicious bone lesions identified. Right hip arthroplasty. IMPRESSION: No acute process demonstrated in the chest, abdomen, or pelvis on noncontrast imaging. Electronically Signed   By: Lucienne Capers M.D.   On: 08/24/2015 04:55   Ct Chest Wo Contrast  08/24/2015  CLINICAL DATA:  Chest pain under the left breast an radiating to the mid chest with onset 12 hours ago. Diaphoresis, nausea, vomiting, abdominal distention, left back pain, cough, shortness of breath, dyspnea on exertion cava and diarrhea. Vomiting. EXAM: CT  CHEST, ABDOMEN AND PELVIS WITHOUT CONTRAST TECHNIQUE: Multidetector CT imaging of the chest, abdomen and pelvis was performed following the standard protocol without IV contrast. Evaluation of solid organs and vascular structures is limited without IV contrast material. COMPARISON:  None. FINDINGS: CT CHEST FINDINGS Mediastinum/Lymph Nodes: No masses or pathologically enlarged lymph nodes identified on this un-enhanced exam. Surgical absence of the thyroid gland. Lungs/Pleura: No pulmonary mass, infiltrate, or effusion. Musculoskeletal: No chest wall mass or suspicious bone lesions identified. CT ABDOMEN PELVIS FINDINGS Hepatobiliary: No mass visualized on this un-enhanced exam. Tiny stone seen in the gallbladder. No bile duct dilatation. Pancreas: No mass or inflammatory process identified on this  un-enhanced exam. Spleen: Within normal limits in size. Adrenals/Urinary Tract: No evidence of urolithiasis or hydronephrosis. No definite mass visualized on this un-enhanced exam. Stomach/Bowel: No evidence of obstruction, inflammatory process, or abnormal fluid collections. Vascular/Lymphatic: No pathologically enlarged lymph nodes. No evidence of abdominal aortic aneurysm. Reproductive: No mass or other significant abnormality. Other: None. Musculoskeletal: No suspicious bone lesions identified. Right hip arthroplasty. IMPRESSION: No acute process demonstrated in the chest, abdomen, or pelvis on noncontrast imaging. Electronically Signed   By: Lucienne Capers M.D.   On: 08/24/2015 04:55   US Abdomen Limited Ruq  08/24/2015  CLINICAL DATA:  Nausea for 12 hours with diarrhea. EXAM: US ABDOMEN LIMITED - RIGHT UPPER QUADRANT COMPARISON:  CT 08/24/2015. FINDINGS: Gallbladder: There is gallbladder sludge and a tiny non shadowing calculus. No gallbladder wall thickening or sonographic Murphy's sign. Common bile duct: Diameter: 5.5 mm Liver: The hepatic echogenicity is increased, corresponding with decreased density on CT, consistent with steatosis. No focal abnormalities identified. IMPRESSION: 1. No acute right upper quadrant abdominal findings. 2. Gallbladder sludge and small gallstone. No evidence of cholecystitis or biliary dilatation. 3. Hepatic steatosis. Electronically Signed   By: Richardean Sale M.D.   On: 08/24/2015 07:57    Microbiology: Recent Results (from the past 240 hour(s))  Culture, blood (Routine X 2) w Reflex to ID Panel     Status: None (Preliminary result)   Collection Time: 08/24/15  9:40 AM  Result Value Ref Range Status   Specimen Description BLOOD LEFT HAND  Final   Special Requests NONE 4 CC EACH  Final   Culture NO GROWTH 2 DAYS  Final   Report Status PENDING  Incomplete  Culture, blood (Routine X 2) w Reflex to ID Panel     Status: None (Preliminary result)   Collection  Time: 08/24/15  9:53 AM  Result Value Ref Range Status   Specimen Description BLOOD LEFT ARM  Final   Special Requests NONE 8 CC EACH  Final   Culture NO GROWTH 2 DAYS  Final   Report Status PENDING  Incomplete  C difficile quick scan w PCR reflex     Status: None   Collection Time: 08/24/15  2:54 PM  Result Value Ref Range Status   C Diff antigen NEGATIVE NEGATIVE Final   C Diff toxin NEGATIVE NEGATIVE Final   C Diff interpretation Negative for toxigenic C. difficile  Final     Labs: Basic Metabolic Panel:  Recent Labs Lab 08/24/15 0100 08/25/15 0515  NA 139 139  K 3.4* 3.7  CL 105 108  CO2 20* 26  GLUCOSE 168* 102*  BUN 11 9  CREATININE 1.48* 0.96  CALCIUM 9.4 8.4*   Liver Function Tests:  Recent Labs Lab 08/24/15 0100 08/25/15 0515  AST 44* 58*  ALT 75* 78*  ALKPHOS 100 64  BILITOT  0.9 0.8  PROT 8.9* 6.1*  ALBUMIN 5.2* 3.5    Recent Labs Lab 08/24/15 0100  LIPASE 24  CBC:  Recent Labs Lab 08/24/15 0100 08/25/15 0515  WBC 23.1* 4.3  NEUTROABS 19.1*  --   HGB 18.3* 13.7  HCT 51.5 40.6  MCV 93.1 95.1  PLT 324 150   Cardiac Enzymes:  Recent Labs Lab 08/24/15 0100 08/24/15 0518  TROPONINI <0.03 <0.03    Signed: Kathie Dike, MD  [facility] 08/26/2015, 11:14 AM

## 2015-08-25 NOTE — Progress Notes (Signed)
Subjective: Patient feels much better. Has not had any bowel movement since yesterday afternoon. He is hungry. His abdominal pain has decreased significantly.  Objective: Vital signs in last 24 hours: Temp:  [97.6 F (36.4 C)-97.9 F (36.6 C)] 97.6 F (36.4 C) (04/19 0538) Pulse Rate:  [62-75] 62 (04/19 0538) Resp:  [18] 18 (04/19 0538) BP: (113-122)/(63-67) 122/66 mmHg (04/19 0538) SpO2:  [97 %-99 %] 99 % (04/19 0538) Last BM Date: 08/24/15  Intake/Output from previous day: 04/18 0701 - 04/19 0700 In: 1219 [P.O.:240; I.V.:829; IV Piggyback:150] Out: -  Intake/Output this shift:    General appearance: alert, cooperative and no distress GI: Soft with no particular tenderness in the right upper quadrant to palpation. Bowel sounds active. No rigidity noted.  Lab Results:   Recent Labs  08/24/15 0100 08/25/15 0515  WBC 23.1* 4.3  HGB 18.3* 13.7  HCT 51.5 40.6  PLT 324 150   BMET  Recent Labs  08/24/15 0100 08/25/15 0515  NA 139 139  K 3.4* 3.7  CL 105 108  CO2 20* 26  GLUCOSE 168* 102*  BUN 11 9  CREATININE 1.48* 0.96  CALCIUM 9.4 8.4*   PT/INR No results for input(s): LABPROT, INR in the last 72 hours.  Studies/Results: Ct Abdomen Pelvis Wo Contrast  08/24/2015  CLINICAL DATA:  Chest pain under the left breast an radiating to the mid chest with onset 12 hours ago. Diaphoresis, nausea, vomiting, abdominal distention, left back pain, cough, shortness of breath, dyspnea on exertion cava and diarrhea. Vomiting. EXAM: CT CHEST, ABDOMEN AND PELVIS WITHOUT CONTRAST TECHNIQUE: Multidetector CT imaging of the chest, abdomen and pelvis was performed following the standard protocol without IV contrast. Evaluation of solid organs and vascular structures is limited without IV contrast material. COMPARISON:  None. FINDINGS: CT CHEST FINDINGS Mediastinum/Lymph Nodes: No masses or pathologically enlarged lymph nodes identified on this un-enhanced exam. Surgical absence of the  thyroid gland. Lungs/Pleura: No pulmonary mass, infiltrate, or effusion. Musculoskeletal: No chest wall mass or suspicious bone lesions identified. CT ABDOMEN PELVIS FINDINGS Hepatobiliary: No mass visualized on this un-enhanced exam. Tiny stone seen in the gallbladder. No bile duct dilatation. Pancreas: No mass or inflammatory process identified on this un-enhanced exam. Spleen: Within normal limits in size. Adrenals/Urinary Tract: No evidence of urolithiasis or hydronephrosis. No definite mass visualized on this un-enhanced exam. Stomach/Bowel: No evidence of obstruction, inflammatory process, or abnormal fluid collections. Vascular/Lymphatic: No pathologically enlarged lymph nodes. No evidence of abdominal aortic aneurysm. Reproductive: No mass or other significant abnormality. Other: None. Musculoskeletal: No suspicious bone lesions identified. Right hip arthroplasty. IMPRESSION: No acute process demonstrated in the chest, abdomen, or pelvis on noncontrast imaging. Electronically Signed   By: Lucienne Capers M.D.   On: 08/24/2015 04:55   Ct Chest Wo Contrast  08/24/2015  CLINICAL DATA:  Chest pain under the left breast an radiating to the mid chest with onset 12 hours ago. Diaphoresis, nausea, vomiting, abdominal distention, left back pain, cough, shortness of breath, dyspnea on exertion cava and diarrhea. Vomiting. EXAM: CT CHEST, ABDOMEN AND PELVIS WITHOUT CONTRAST TECHNIQUE: Multidetector CT imaging of the chest, abdomen and pelvis was performed following the standard protocol without IV contrast. Evaluation of solid organs and vascular structures is limited without IV contrast material. COMPARISON:  None. FINDINGS: CT CHEST FINDINGS Mediastinum/Lymph Nodes: No masses or pathologically enlarged lymph nodes identified on this un-enhanced exam. Surgical absence of the thyroid gland. Lungs/Pleura: No pulmonary mass, infiltrate, or effusion. Musculoskeletal: No chest wall mass or  suspicious bone lesions  identified. CT ABDOMEN PELVIS FINDINGS Hepatobiliary: No mass visualized on this un-enhanced exam. Tiny stone seen in the gallbladder. No bile duct dilatation. Pancreas: No mass or inflammatory process identified on this un-enhanced exam. Spleen: Within normal limits in size. Adrenals/Urinary Tract: No evidence of urolithiasis or hydronephrosis. No definite mass visualized on this un-enhanced exam. Stomach/Bowel: No evidence of obstruction, inflammatory process, or abnormal fluid collections. Vascular/Lymphatic: No pathologically enlarged lymph nodes. No evidence of abdominal aortic aneurysm. Reproductive: No mass or other significant abnormality. Other: None. Musculoskeletal: No suspicious bone lesions identified. Right hip arthroplasty. IMPRESSION: No acute process demonstrated in the chest, abdomen, or pelvis on noncontrast imaging. Electronically Signed   By: Lucienne Capers M.D.   On: 08/24/2015 04:55   US Abdomen Limited Ruq  08/24/2015  CLINICAL DATA:  Nausea for 12 hours with diarrhea. EXAM: US ABDOMEN LIMITED - RIGHT UPPER QUADRANT COMPARISON:  CT 08/24/2015. FINDINGS: Gallbladder: There is gallbladder sludge and a tiny non shadowing calculus. No gallbladder wall thickening or sonographic Murphy's sign. Common bile duct: Diameter: 5.5 mm Liver: The hepatic echogenicity is increased, corresponding with decreased density on CT, consistent with steatosis. No focal abnormalities identified. IMPRESSION: 1. No acute right upper quadrant abdominal findings. 2. Gallbladder sludge and small gallstone. No evidence of cholecystitis or biliary dilatation. 3. Hepatic steatosis. Electronically Signed   By: Richardean Sale M.D.   On: 08/24/2015 07:57    Anti-infectives: Anti-infectives    Start     Dose/Rate Route Frequency Ordered Stop   08/24/15 1100  ampicillin-sulbactam (UNASYN) 1.5 g in sodium chloride 0.9 % 50 mL IVPB     1.5 g 100 mL/hr over 30 Minutes Intravenous Every 8 hours 08/24/15 1046      08/24/15 0515  Ampicillin-Sulbactam (UNASYN) 3 g in sodium chloride 0.9 % 100 mL IVPB     3 g 100 mL/hr over 60 Minutes Intravenous  Once 08/24/15 0510 08/24/15 0620      Assessment/Plan: Impression: Abdominal pain and diarrhea, resolved. Leukocytosis has remarkably normalized. Patient does not have significant right upper quadrant pain at this time. We'll advance to soft diet and see how this goes. Should he continue to improve, he may be discharged home and I would see him as an outpatient for follow-up of cholelithiasis.  LOS: 1 day    Zannie Runkle A 08/25/2015

## 2015-08-25 NOTE — Care Management Note (Signed)
Case Management Note  Patient Details  Name: Andrew Love MRN: [id number] Date of Birth: June 06, 1965  Subjective/Objective:                  Pt is from home, lives with wife and is ind with ADL's. Pt has PCP, transportation and no difficulty obtaining medications. Pt plans to return home with self care at DC. Pt's wife is at the bedside.   Action/Plan: Anticipate DC home today. No CM needs.   Expected Discharge Date:       08/25/2015           Expected Discharge Plan:  Home/Self Care  In-House Referral:  NA  Discharge planning Services  CM Consult  Post Acute Care Choice:  NA Choice offered to:  NA  DME Arranged:    DME Agency:     HH Arranged:    HH Agency:     Status of Service:  Completed, signed off  Medicare Important Message Given:    Date Medicare IM Given:    Medicare IM give by:    Date Additional Medicare IM Given:    Additional Medicare Important Message give by:     If discussed at [place] of Stay Meetings, dates discussed:    Additional Comments:  Sherald Barge, RN 08/25/2015, 2:36 PM

## 2015-08-26 DIAGNOSIS — K529 Noninfective gastroenteritis and colitis, unspecified: Secondary | ICD-10-CM | POA: Diagnosis present

## 2015-08-26 NOTE — Plan of Care (Signed)
Problem: Nutrition: Goal: Adequate nutrition will be maintained Outcome: Completed/Met Date Met:  08/26/15 Tolerating diet

## 2015-08-26 NOTE — Progress Notes (Signed)
  Subjective: Patient feels slightly better. Did have one loose stool. Does have mild intermittent right upper quadrant abdominal pain and nausea. He is keeping his food down. Interestingly, he reports that his wife has come down with abdominal discomfort and diarrhea.  Objective: Vital signs in last 24 hours: Temp:  [97.8 F (36.6 C)-98.7 F (37.1 C)] 98.7 F (37.1 C) (04/20 0505) Pulse Rate:  [59-68] 59 (04/20 0505) Resp:  [20] 20 (04/20 0505) BP: (116-128)/(69-74) 128/74 mmHg (04/20 0505) SpO2:  [92 %-98 %] 92 % (04/20 0505) Last BM Date: 08/25/15  Intake/Output from previous day: 04/19 0701 - 04/20 0700 In: 360 [P.O.:360] Out: -  Intake/Output this shift:    General appearance: alert, cooperative and no distress GI: Soft with slight discomfort to deep palpation in right upper quadrant, but no rigidity noted.  Lab Results:   Recent Labs  08/24/15 0100 08/25/15 0515  WBC 23.1* 4.3  HGB 18.3* 13.7  HCT 51.5 40.6  PLT 324 150   BMET  Recent Labs  08/24/15 0100 08/25/15 0515  NA 139 139  K 3.4* 3.7  CL 105 108  CO2 20* 26  GLUCOSE 168* 102*  BUN 11 9  CREATININE 1.48* 0.96  CALCIUM 9.4 8.4*   PT/INR No results for input(s): LABPROT, INR in the last 72 hours.  Studies/Results: No results found.  Anti-infectives: Anti-infectives    Start     Dose/Rate Route Frequency Ordered Stop   08/24/15 1100  ampicillin-sulbactam (UNASYN) 1.5 g in sodium chloride 0.9 % 50 mL IVPB  Status:  Discontinued     1.5 g 100 mL/hr over 30 Minutes Intravenous Every 8 hours 08/24/15 1046 08/25/15 1856   08/24/15 0515  Ampicillin-Sulbactam (UNASYN) 3 g in sodium chloride 0.9 % 100 mL IVPB     3 g 100 mL/hr over 60 Minutes Intravenous  Once 08/24/15 0510 08/24/15 0620      Assessment/Plan: Impression: Abdominal discomfort and diarrhea, resolving. Patient does have history of cholelithiasis. Plan: Patient feels well enough to go home. He would like to follow-up with Dr.  Dalbert Batman in [place] as he previously did surgery on him in 2012 for his cholelithiasis. Patient is clear for discharge from surgery standpoint.  LOS: 2 days    Julina Altmann A 08/26/2015

## 2015-08-26 NOTE — Progress Notes (Signed)
Patient transported by RN via wheelchair to main entrance for discharge.  Patient stable at time of discharge.

## 2015-08-26 NOTE — Progress Notes (Signed)
Patient's IV removed.  Site WNL.  AVS reviewed with patient.  Verbalized understanding of discharge instructions, physician follow-up, medications.  Prescription for Zofran ODT given to patient.  Patient reports belongings intact and in possession at time of discharge.  Patient awaiting his wife to provide transportation home.

## 2015-08-26 NOTE — Care Management Obs Status (Signed)
[facility] NOTIFICATION   Patient Details  Name: Andrew Love MRN: [id number] Date of Birth: [birth year]   Medicare Observation Status Notification Given:  Yes    Alvie Heidelberg, RN 08/26/2015, 11:41 AM

## 2015-08-26 NOTE — Progress Notes (Addendum)
PROGRESS NOTE    Andrew Love  [id number] DOB: [birth year] DOA: 08/24/2015 PCP: [name], MD  Outpatient Specialists: Dr. Dalbert Batman, General Surgery   Assessment & Plan: 1. Gastroenteritis, likely from food source. General surgery has consulted and per his evaluation cholecystitis has been ruled out. No need for surgical intervention. Clinically he is improving. Patient was discharged yesterday, but had some recurrence of pain. He was monitored overnight and feels improved today. Tolerating diet. His wife has come down with similar symptoms. He will need to follow up with Dr. Dalbert Batman if there is any recurrence of his symptoms. 2. Abdominal pain and diarrhea, secondary to #1. Continue pain management.  3. HLD. Continue statins 4. Hypothyroidism. Continue synthroid    Active Problems:   CAD (coronary artery disease)   Essential hypertension   Cholelithiases   Abdominal pain   Diarrhea   Gastroenteritis    DVT prophylaxis: Heparin Code Status: Full Family Communication: No family present at bedside Disposition Plan: discharge home within 24 hrs.   Consultants:   General surgery  Procedures:   none  Antimicrobials:   none    Subjective: Feels improved today. Has not had any BM since last night.   Objective: Filed Vitals:   08/25/15 1311 08/25/15 2029 08/26/15 0505 08/26/15 0926  BP: 116/74 123/69 128/74 123/70  Pulse: 68 64 59 61  Temp: 97.8 F (36.6 C) 98.1 F (36.7 C) 98.7 F (37.1 C)   TempSrc: Oral Oral Oral   Resp: 20 20 20    Height:      Weight:      SpO2: 94% 98% 92%     Intake/Output Summary (Last 24 hours) at 08/26/15 1114 Last data filed at 08/26/15 0938  Gross per 24 hour  Intake    600 ml  Output      0 ml  Net    600 ml   Filed Weights   08/24/15 0048 08/24/15 1036  Weight: 107.502 kg (237 lb) 106.595 kg (235 lb)    Examination:   General exam: Appears calm and comfortable  Respiratory system: Clear to auscultation.  Respiratory effort normal. Cardiovascular system: S1 & S2 heard, RRR. No JVD, murmurs, rubs, gallops or clicks. No pedal edema. Gastrointestinal system: Abdomen is nondistended, soft and nontender. No organomegaly or masses felt. Normal bowel sounds heard. Central nervous system: Alert and oriented. No focal neurological deficits. Extremities: Symmetric 5 x 5 power. Skin: No rashes, lesions or ulcers Psychiatry: Judgement and insight appear normal. Mood & affect appropriate.     Data Reviewed: I have personally reviewed following labs and imaging studies  CBC:  Recent Labs Lab 08/24/15 0100 08/25/15 0515  WBC 23.1* 4.3  NEUTROABS 19.1*  --   HGB 18.3* 13.7  HCT 51.5 40.6  MCV 93.1 95.1  PLT 324 [id number]   Basic Metabolic Panel:  Recent Labs Lab 08/24/15 0100 08/25/15 0515  NA 139 139  K 3.4* 3.7  CL 105 108  CO2 20* 26  GLUCOSE 168* 102*  BUN 11 9  CREATININE 1.48* 0.96  CALCIUM 9.4 8.4*   GFR: Estimated Creatinine Clearance: 119.3 mL/min (by C-G formula based on Cr of 0.96). Liver Function Tests:  Recent Labs Lab 08/24/15 0100 08/25/15 0515  AST 44* 58*  ALT 75* 78*  ALKPHOS 100 64  BILITOT 0.9 0.8  PROT 8.9* 6.1*  ALBUMIN 5.2* 3.5    Recent Labs Lab 08/24/15 0100  LIPASE 24   Cardiac Enzymes:  Recent Labs Lab 08/24/15 0100 08/24/15  0518  TROPONINI <0.03 <0.03    Recent Labs  08/24/15 0100  TSH 0.067*   Anemia Panel: No results for input(s): VITAMINB12, FOLATE, FERRITIN, TIBC, IRON, RETICCTPCT in the last 72 hours. Urine analysis:    Component Value Date/Time   COLORURINE YELLOW 08/24/2015 [place] 08/24/2015 0557   LABSPEC 1.015 08/24/2015 0557   PHURINE 6.0 08/24/2015 0557   GLUCOSEU NEGATIVE 08/24/2015 0557   HGBUR NEGATIVE 08/24/2015 0557   BILIRUBINUR NEGATIVE 08/24/2015 0557   [place] 08/24/2015 0557   PROTEINUR TRACE* 08/24/2015 0557   UROBILINOGEN 0.2 03/31/2014 0150   NITRITE NEGATIVE 08/24/2015  0557   LEUKOCYTESUR NEGATIVE 08/24/2015 0557   Sepsis Labs: @LABRCNTIP (procalcitonin:4,lacticidven:4)  ) Recent Results (from the past 240 hour(s))  Culture, blood (Routine X 2) w Reflex to ID Panel     Status: None (Preliminary result)   Collection Time: 08/24/15  9:40 AM  Result Value Ref Range Status   Specimen Description BLOOD LEFT HAND  Final   Special Requests NONE 4 CC EACH  Final   Culture NO GROWTH 2 DAYS  Final   Report Status PENDING  Incomplete  Culture, blood (Routine X 2) w Reflex to ID Panel     Status: None (Preliminary result)   Collection Time: 08/24/15  9:53 AM  Result Value Ref Range Status   Specimen Description BLOOD LEFT ARM  Final   Special Requests NONE 8 CC EACH  Final   Culture NO GROWTH 2 DAYS  Final   Report Status PENDING  Incomplete  C difficile quick scan w PCR reflex     Status: None   Collection Time: 08/24/15  2:54 PM  Result Value Ref Range Status   C Diff antigen NEGATIVE NEGATIVE Final   C Diff toxin NEGATIVE NEGATIVE Final   C Diff interpretation Negative for toxigenic C. difficile  Final         Radiology Studies: No results found.      Scheduled Meds: . escitalopram  10 mg Oral Daily  . heparin  5,000 Units Subcutaneous Q8H  . irbesartan  37.5 mg Oral Daily  . levothyroxine  300 mcg Oral QAC breakfast  . pantoprazole  40 mg Oral Daily  . zolpidem  10 mg Oral QHS   Continuous Infusions: . dextrose 5 % and 0.9 % NaCl with KCl 20 mEq/L 75 mL/hr at 08/26/15 0550     LOS: 2 days    Time spent: 25 minutes    Kathie Dike, MD [facility] Pager [phone]  If 7PM-7AM, please contact night-coverage [email or web address] Password [email or web address] 08/26/2015, 11:14 AM    By signing my name below, I, Delene Ruffini, attest that this documentation has been prepared under the direction and in the presence of Kathie Dike, MD. Electronically Signed: Delene Ruffini 08/26/2015 11:05am.  I, Dr. Kathie Dike, personally  performed the services described in this documentaiton. All medical record entries made by the scribe were at my direction and in my presence. I have reviewed the chart and agree that the record reflects my personal performance and is accurate and complete  Kathie Dike, MD, 08/26/2015 11:14 AM

## 2015-08-30 ENCOUNTER — Observation Stay (HOSPITAL_COMMUNITY)
Admission: EM | Admit: 2015-08-30 | Discharge: 2015-09-01 | Disposition: A | Discharge status: Home / Self Care | Payer: Commercial Managed Care - HMO | Attending: General Surgery | Admitting: General Surgery

## 2015-08-30 ENCOUNTER — Encounter (HOSPITAL_COMMUNITY): Payer: Self-pay | Admitting: Emergency Medicine

## 2015-08-30 DIAGNOSIS — K8020 Calculus of gallbladder without cholecystitis without obstruction: Secondary | ICD-10-CM | POA: Diagnosis not present

## 2015-08-30 DIAGNOSIS — I739 Peripheral vascular disease, unspecified: Secondary | ICD-10-CM | POA: Diagnosis not present

## 2015-08-30 DIAGNOSIS — E039 Hypothyroidism, unspecified: Secondary | ICD-10-CM | POA: Diagnosis not present

## 2015-08-30 DIAGNOSIS — R52 Pain, unspecified: Secondary | ICD-10-CM

## 2015-08-30 DIAGNOSIS — R0600 Dyspnea, unspecified: Secondary | ICD-10-CM | POA: Diagnosis not present

## 2015-08-30 DIAGNOSIS — R358 Other polyuria: Secondary | ICD-10-CM | POA: Insufficient documentation

## 2015-08-30 DIAGNOSIS — Z01810 Encounter for preprocedural cardiovascular examination: Secondary | ICD-10-CM | POA: Insufficient documentation

## 2015-08-30 DIAGNOSIS — M1990 Unspecified osteoarthritis, unspecified site: Secondary | ICD-10-CM | POA: Diagnosis not present

## 2015-08-30 DIAGNOSIS — Z9989 Dependence on other enabling machines and devices: Secondary | ICD-10-CM | POA: Diagnosis not present

## 2015-08-30 DIAGNOSIS — Z9119 Patient's noncompliance with other medical treatment and regimen: Secondary | ICD-10-CM | POA: Insufficient documentation

## 2015-08-30 DIAGNOSIS — I10 Essential (primary) hypertension: Secondary | ICD-10-CM | POA: Diagnosis not present

## 2015-08-30 DIAGNOSIS — Z6831 Body mass index (BMI) 31.0-31.9, adult: Secondary | ICD-10-CM | POA: Insufficient documentation

## 2015-08-30 DIAGNOSIS — I2510 Atherosclerotic heart disease of native coronary artery without angina pectoris: Secondary | ICD-10-CM | POA: Diagnosis not present

## 2015-08-30 DIAGNOSIS — G4733 Obstructive sleep apnea (adult) (pediatric): Secondary | ICD-10-CM | POA: Insufficient documentation

## 2015-08-30 DIAGNOSIS — E669 Obesity, unspecified: Secondary | ICD-10-CM | POA: Diagnosis not present

## 2015-08-30 DIAGNOSIS — F17210 Nicotine dependence, cigarettes, uncomplicated: Secondary | ICD-10-CM | POA: Diagnosis not present

## 2015-08-30 DIAGNOSIS — Z8673 Personal history of transient ischemic attack (TIA), and cerebral infarction without residual deficits: Secondary | ICD-10-CM | POA: Insufficient documentation

## 2015-08-30 DIAGNOSIS — Z96641 Presence of right artificial hip joint: Secondary | ICD-10-CM | POA: Insufficient documentation

## 2015-08-30 DIAGNOSIS — K8010 Calculus of gallbladder with chronic cholecystitis without obstruction: Principal | ICD-10-CM | POA: Insufficient documentation

## 2015-08-30 DIAGNOSIS — M1631 Unilateral osteoarthritis resulting from hip dysplasia, right hip: Secondary | ICD-10-CM | POA: Diagnosis not present

## 2015-08-30 DIAGNOSIS — R112 Nausea with vomiting, unspecified: Secondary | ICD-10-CM | POA: Diagnosis present

## 2015-08-30 LAB — CBC WITH DIFFERENTIAL/PLATELET
Basophils Absolute: 0 10*3/uL (ref 0.0–0.1)
Basophils Relative: 0 %
Eosinophils Absolute: 0.3 10*3/uL (ref 0.0–0.7)
Eosinophils Relative: 3 %
HCT: 46 % (ref 39.0–52.0)
Hemoglobin: 16.2 g/dL (ref 13.0–17.0)
Lymphocytes Relative: 31 %
Lymphs Abs: 3 10*3/uL (ref 0.7–4.0)
MCH: 32.2 pg (ref 26.0–34.0)
MCHC: 35.2 g/dL (ref 30.0–36.0)
MCV: 91.5 fL (ref 78.0–100.0)
Monocytes Absolute: 1 10*3/uL (ref 0.1–1.0)
Monocytes Relative: 10 %
Neutro Abs: 5.3 10*3/uL (ref 1.7–7.7)
Neutrophils Relative %: 56 %
Platelets: 252 10*3/uL (ref 150–400)
RBC: 5.03 MIL/uL (ref 4.22–5.81)
RDW: 12.7 % (ref 11.5–15.5)
WBC: 9.7 10*3/uL (ref 4.0–10.5)

## 2015-08-30 LAB — COMPREHENSIVE METABOLIC PANEL
ALT: 70 U/L — ABNORMAL HIGH (ref 17–63)
AST: 38 U/L (ref 15–41)
Albumin: 4.4 g/dL (ref 3.5–5.0)
Alkaline Phosphatase: 74 U/L (ref 38–126)
Anion gap: 11 (ref 5–15)
BUN: 15 mg/dL (ref 6–20)
CO2: 21 mmol/L — ABNORMAL LOW (ref 22–32)
Calcium: 9 mg/dL (ref 8.9–10.3)
Chloride: 104 mmol/L (ref 101–111)
Creatinine, Ser: 1.02 mg/dL (ref 0.61–1.24)
GFR calc Af Amer: >60 mL/min (ref >60)
GFR calc non Af Amer: >60 mL/min (ref >60)
Glucose, Bld: 110 mg/dL — ABNORMAL HIGH (ref 65–99)
Potassium: 3.5 mmol/L (ref 3.5–5.1)
Sodium: 136 mmol/L (ref 135–145)
Total Bilirubin: 0.3 mg/dL (ref 0.3–1.2)
Total Protein: 7.3 g/dL (ref 6.5–8.1)

## 2015-08-30 LAB — URINALYSIS, ROUTINE W REFLEX MICROSCOPIC
Bilirubin Urine: NEGATIVE
Glucose, UA: NEGATIVE mg/dL
Hgb urine dipstick: NEGATIVE
Ketones, ur: NEGATIVE mg/dL
Leukocytes, UA: NEGATIVE
Nitrite: NEGATIVE
Protein, ur: NEGATIVE mg/dL
Specific Gravity, Urine: 1.015 (ref 1.005–1.030)
pH: 6 (ref 5.0–8.0)

## 2015-08-30 LAB — SURGICAL PCR SCREEN
MRSA, PCR: NEGATIVE
Staphylococcus aureus: NEGATIVE

## 2015-08-30 LAB — CULTURE, BLOOD (ROUTINE X 2)
Culture: NO GROWTH
Culture: NO GROWTH

## 2015-08-30 LAB — I-STAT TROPONIN, ED: Troponin i, poc: 0 ng/mL (ref 0.00–0.08)

## 2015-08-30 LAB — LIPASE, BLOOD: Lipase: 20 U/L (ref 11–51)

## 2015-08-30 MED ORDER — ACETAMINOPHEN 650 MG RE SUPP: 650.0000 mg | Freq: Four times a day (QID) | RECTAL | Status: DC | PRN

## 2015-08-30 MED ORDER — SODIUM CHLORIDE 0.9 % IV BOLUS (SEPSIS)
1000.0000 mL | Freq: Once | INTRAVENOUS | Status: AC
  Administered 2015-08-30: 1000 mL via INTRAVENOUS

## 2015-08-30 MED ORDER — DEXTROSE-NACL 5-0.9 % IV SOLN
INTRAVENOUS | Status: DC
  Administered 2015-08-30 – 2015-09-01 (×3): via INTRAVENOUS

## 2015-08-30 MED ORDER — ONDANSETRON 4 MG PO TBDP: 4.0000 mg | ORAL_TABLET | Freq: Four times a day (QID) | ORAL | Status: DC | PRN

## 2015-08-30 MED ORDER — ACETAMINOPHEN 325 MG PO TABS
650.0000 mg | ORAL_TABLET | Freq: Four times a day (QID) | ORAL | Status: DC | PRN
  Administered 2015-09-01: 650 mg via ORAL
  Filled 2015-08-30: qty 2

## 2015-08-30 MED ORDER — PANTOPRAZOLE SODIUM 40 MG PO TBEC
40.0000 mg | DELAYED_RELEASE_TABLET | Freq: Every day | ORAL | Status: DC
  Administered 2015-09-01: 40 mg via ORAL
  Filled 2015-08-30 (×2): qty 1

## 2015-08-30 MED ORDER — CEFTRIAXONE SODIUM 2 G IJ SOLR
2.0000 g | INTRAMUSCULAR | Status: DC
  Administered 2015-08-30: 2 g via INTRAVENOUS
  Filled 2015-08-30 (×2): qty 2

## 2015-08-30 MED ORDER — ESCITALOPRAM OXALATE 10 MG PO TABS
10.0000 mg | ORAL_TABLET | Freq: Every day | ORAL | Status: DC
  Administered 2015-09-01: 10 mg via ORAL
  Filled 2015-08-30 (×2): qty 1

## 2015-08-30 MED ORDER — ZOLPIDEM TARTRATE 10 MG PO TABS
10.0000 mg | ORAL_TABLET | Freq: Every day | ORAL | Status: DC
  Administered 2015-08-30 – 2015-08-31 (×2): 10 mg via ORAL
  Filled 2015-08-30 (×2): qty 1

## 2015-08-30 MED ORDER — LEVOTHYROXINE SODIUM 150 MCG PO TABS
300.0000 ug | ORAL_TABLET | Freq: Every day | ORAL | Status: DC
  Administered 2015-09-01: 300 ug via ORAL
  Filled 2015-08-30 (×3): qty 2

## 2015-08-30 MED ORDER — IRBESARTAN 150 MG PO TABS
150.0000 mg | ORAL_TABLET | Freq: Every day | ORAL | Status: DC
  Administered 2015-09-01: 150 mg via ORAL
  Filled 2015-08-30 (×2): qty 1

## 2015-08-30 MED ORDER — MORPHINE SULFATE (PF) 4 MG/ML IV SOLN
4.0000 mg | Freq: Once | INTRAVENOUS | Status: AC
  Administered 2015-08-30: 4 mg via INTRAVENOUS
  Filled 2015-08-30: qty 1

## 2015-08-30 MED ORDER — ONDANSETRON HCL 4 MG/2ML IJ SOLN
4.0000 mg | Freq: Once | INTRAMUSCULAR | Status: AC
  Administered 2015-08-30: 4 mg via INTRAVENOUS
  Filled 2015-08-30: qty 2

## 2015-08-30 MED ORDER — ENOXAPARIN SODIUM 40 MG/0.4ML ~~LOC~~ SOLN
40.0000 mg | SUBCUTANEOUS | Status: DC
  Administered 2015-08-30 – 2015-08-31 (×2): 40 mg via SUBCUTANEOUS
  Filled 2015-08-30 (×3): qty 0.4

## 2015-08-30 MED ORDER — HYDROMORPHONE HCL 1 MG/ML IJ SOLN
1.0000 mg | INTRAMUSCULAR | Status: DC | PRN
  Administered 2015-08-30 – 2015-09-01 (×9): 1 mg via INTRAVENOUS
  Filled 2015-08-30 (×9): qty 1

## 2015-08-30 MED ORDER — ONDANSETRON HCL 4 MG/2ML IJ SOLN
4.0000 mg | Freq: Four times a day (QID) | INTRAMUSCULAR | Status: DC | PRN
  Administered 2015-08-30 – 2015-08-31 (×3): 4 mg via INTRAVENOUS
  Filled 2015-08-30 (×3): qty 2

## 2015-08-30 NOTE — ED Notes (Signed)
MD at bedside. 

## 2015-08-30 NOTE — ED Notes (Signed)
MD at bedside. SURGERY PRESENT SPEAKING WITH PT

## 2015-08-30 NOTE — H&P (Signed)
Chief Complaint: abdominal pain, nausea and vomiting HPI: Andrew Love is a 50 year old male with a history of OSA, HTN, diverticulitis s/p lap assisted sigmoid colectomy in 2012, nicotine dependence, hypothyroidism who presents with recurrent abdominal pain.  He was hospitalized at [facility] 4/18-4/20 with gastroenteritis which started last Monday night. Symptoms included, nausea, vomiting, diarrhea.  Wife had like symptoms a few days later.  The patient was subsequently discharged home, but had persistent nausea.  Last night, he developed RUQ abdominal pain which was severe and prompted him to return to the ED.  This radiates to the epigastrium and left chest. Diarrhea has resolved. Denies fever, chills or sweats.  Work up shows, normal WBC, normal LFTs.  During his last hospitalization, CT of a/p was essentially normal.  Abdominal US revealed gallbladder sludge and stones.  We have therefore been asked to evaluate.  Over the last month he also endorses to dyspnea on exertion and chest pressure.  He admits he is non compliant with CPAP, takes his medication, continues to smoke.    He had reported back surgery in January and last December.    Past Medical History  Diagnosis Date  . CAD (coronary artery disease)   . Elevated liver enzymes   . Diverticulitis     Hx of; requiring 3 admissions  . Sigmoid colon ulcer     Rectal polyps  . Thyroid disease   . Psoriasis     Per medical history form dated 06/13/10.  . Chronic insomnia     Per medical history form dated 06/13/10.  . Colitis     Per medical history from dated 06/13/10.  Marland Kitchen Hypertension   . Hypothyroidism   . Bronchitis     history of  . Back pain     chronic  . Arthritis   . 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 Sleep apnea with use of continuous positive airway pressure (CPAP)     Past Surgical History  Procedure Laterality Date  . Colonoscopy  07/2008    Colitis,NSAID v. Ischemia,malrotation of the  gut,Diverticulosis(L),hyperplastic  . Appendectomy      8th grade  . Shoulder surgery      Left  . Rotator cuff repair      Left - per medical history form dated 06/13/10.  . Thyroidectomy      Per medical history form dated 06/13/10.  . Laparoscopic sigmoid colectomy  2012    Dr. Claud Kelp: recurrent sigmoid diverticulitis  . Total hip arthroplasty  07/04/2011    Procedure: TOTAL HIP ARTHROPLASTY;  Surgeon: Eulas Post, MD;  Location: [facility] OR;  Service: Orthopedics;  Laterality: Right;  . Joint replacement    . Cardiac catheterization  2009  . Cardiac catheterization      Per medical history from dated 06/13/10.  . Colonoscopy N/A 12/13/2012    Dr. Darrick Penna: Normal TI, mild sigmoid diverticulosis, hemorrhoids, 2 polyps (tubular adenoma). Random colon bx negative. Next TCS 12/2022 with Fentanyl/phenergan  . Esophagogastroduodenoscopy N/A 04/16/2014    RMR: Erosive reflux esophagitis. Non critical Schzki's ring not manipulated. Small hiatal hernia. Abnormal gastirc mucosa of doubtful signigicance status post biopsy. I suspect trivial upper GI bleed. Recent abdominal pain presumably secondary to a protracted bout of diverticulitis. CT scan November 23 revealed improvemetn without complication. He finished his antibiotics 2 days age. Left sided ab  . Cervical spine surgery      C4, C5, C6 spinal fusion    Family History  Problem  Relation Age of Onset  . Cancer Mother     breast cancer - per medical history form dated 06/13/10.  . Diverticulitis Mother   . Hypertension Mother   . Cancer Father     skin - per medical history form dated 06/13/10.  Marland Kitchen Hypertension Father   . Anesthesia problems Neg Hx   . Hypotension Neg Hx   . Malignant hyperthermia Neg Hx   . Pseudochol deficiency Neg Hx   . Colon cancer Neg Hx    Social History:  reports that he has been smoking Cigarettes.  He has a 9 pack-year smoking history. He has never used smokeless tobacco. He reports that he drinks alcohol. He  reports that he does not use illicit drugs.  Allergies:  Allergies  Allergen Reactions  . Other Hives, Shortness Of Breath and Itching    Insect bite- bug name unknown=  . Iohexol Hives     (Not in a hospital admission)  Results for orders placed or performed during the hospital encounter of 08/30/15 (from the past 48 hour(s))  Lipase, blood     Status: None   Collection Time: 08/30/15  8:57 AM  Result Value Ref Range   Lipase 20 11 - 51 U/L  CBC with Differential     Status: None   Collection Time: 08/30/15  8:57 AM  Result Value Ref Range   WBC 9.7 4.0 - 10.5 K/uL   RBC 5.03 4.22 - 5.81 MIL/uL   Hemoglobin 16.2 13.0 - 17.0 g/dL   HCT 46.0 39.0 - 52.0 %   MCV 91.5 78.0 - 100.0 fL   MCH 32.2 26.0 - 34.0 pg   MCHC 35.2 30.0 - 36.0 g/dL   RDW 12.7 11.5 - 15.5 %   Platelets 252 150 - 400 K/uL   Neutrophils Relative % 56 %   Neutro Abs 5.3 1.7 - 7.7 K/uL   Lymphocytes Relative 31 %   Lymphs Abs 3.0 0.7 - 4.0 K/uL   Monocytes Relative 10 %   Monocytes Absolute 1.0 0.1 - 1.0 K/uL   Eosinophils Relative 3 %   Eosinophils Absolute 0.3 0.0 - 0.7 K/uL   Basophils Relative 0 %   Basophils Absolute 0.0 0.0 - 0.1 K/uL  Comprehensive metabolic panel     Status: Abnormal   Collection Time: 08/30/15  8:57 AM  Result Value Ref Range   Sodium 136 135 - 145 mmol/L   Potassium 3.5 3.5 - 5.1 mmol/L   Chloride 104 101 - 111 mmol/L   CO2 21 (L) 22 - 32 mmol/L   Glucose, Bld 110 (H) 65 - 99 mg/dL   BUN 15 6 - 20 mg/dL   Creatinine, Ser 1.02 0.61 - 1.24 mg/dL   Calcium 9.0 8.9 - 10.3 mg/dL   Total Protein 7.3 6.5 - 8.1 g/dL   Albumin 4.4 3.5 - 5.0 g/dL   AST 38 15 - 41 U/L   ALT 70 (H) 17 - 63 U/L   Alkaline Phosphatase 74 38 - 126 U/L   Total Bilirubin 0.3 0.3 - 1.2 mg/dL   GFR calc non Af Amer >60 >60 mL/min   GFR calc Af Amer >60 >60 mL/min    Comment: (NOTE) The eGFR has been calculated using the CKD EPI equation. This calculation has not been validated in all clinical  situations. eGFR's persistently <60 mL/min signify possible Chronic Kidney Disease.    Anion gap 11 5 - 15  I-Stat Troponin, ED (not at [facility])  Status: None   Collection Time: 08/30/15  9:05 AM  Result Value Ref Range   Troponin i, poc 0.00 0.00 - 0.08 ng/mL   Comment 3            Comment: Due to the release kinetics of cTnI, a negative result within the first hours of the onset of symptoms does not rule out myocardial infarction with certainty. If myocardial infarction is still suspected, repeat the test at appropriate intervals.    No results found.  Review of Systems  Constitutional: Positive for malaise/fatigue. Negative for fever, chills, weight loss and diaphoresis.  Eyes: Positive for blurred vision.  Respiratory: Positive for shortness of breath. Negative for cough, hemoptysis, sputum production and wheezing.   Cardiovascular: Positive for chest pain. Negative for palpitations, orthopnea, claudication, leg swelling and PND.  Gastrointestinal: Positive for nausea, vomiting and abdominal pain. Negative for blood in stool and melena.  Genitourinary: Positive for dysuria and frequency. Negative for urgency, hematuria and flank pain.  Neurological: Positive for dizziness and weakness. Negative for tingling, tremors, sensory change, speech change, focal weakness, seizures, loss of consciousness and headaches.  Psychiatric/Behavioral: Negative for depression, suicidal ideas, hallucinations and substance abuse. The patient is not nervous/anxious and does not have insomnia.     Blood pressure 101/55, pulse 58, temperature 98.5 F (36.9 C), temperature source Oral, resp. rate 20, height 6' 1"  (1.854 m), weight 106.595 kg (235 lb), SpO2 97 %. Physical Exam  Constitutional: He is oriented to person, place, and time. He appears well-developed and well-nourished. No distress.  Cardiovascular: Normal rate, regular rhythm, normal heart sounds and intact distal pulses.  Exam reveals no  gallop and no friction rub.   No murmur heard. Respiratory: Effort normal and breath sounds normal. No respiratory distress. He has no wheezes. He has no rales. He exhibits no tenderness.  GI: Soft. Bowel sounds are normal. He exhibits no mass. There is no rebound and no guarding.  TTP RUQ.   Musculoskeletal: Normal range of motion. He exhibits no edema or tenderness.  Neurological: He is alert and oriented to person, place, and time.  Skin: Skin is warm and dry. No rash noted. He is not diaphoretic. No erythema. No pallor.  Psychiatric: He has a normal mood and affect. His behavior is normal. Judgment and thought content normal.     Assessment/Plan Symptomatic cholelithiasis-possible early cholecystitis.  Will hold off on repeating US for now.  Start rocephin, NPO for now.  Further surgical recommendations based on cardiology evaluation.  OSA-non compliant with CPAP.  Discussed importance.  Will add continuous pulse oximetry Dyspnea, Hx CAD-ask cardiology to evaluate.  Unfortunately still smokes, non compliant with CPAP.  Has not seen cardiology in several years.  Polyuria/polydypsia-check A1c Hypothyroidism-TSH elevated 4/17.  May ask IM to help with dose adjustment  HTN-home meds  VTE prophylaxis-SCD/lovenox Dispo-to floor     Erby Pian, NP 08/30/2015, 10:58 AM

## 2015-08-30 NOTE — ED Notes (Signed)
Admitted at [facility] Monday, stayed until Thursday for Gallstones. Says they told him it needed surgery but he wanted to try and manage with medications, states nausea is the biggest complaint, unable to eat. Occasional abdominal pain, 3/10 at the moment.

## 2015-08-30 NOTE — ED Notes (Signed)
Pt can go at 11:25

## 2015-08-30 NOTE — ED Notes (Signed)
LOW BP. PT ALERT AND ACTIVE WITH CARE. NO OTHER PROBLEMS. PLACED ON [place]. BP RECOVERED WITHOUT EVENT. MANUAL BP TAKEN AND RECORDED.

## 2015-08-30 NOTE — ED Notes (Signed)
Pt. Unable to use the restroom at this time, but is aware that we need a urine specimen.  

## 2015-08-30 NOTE — ED Provider Notes (Signed)
CSN: [id number]     Arrival date & time 08/30/15  0806 History   First MD Initiated Contact with Patient 08/30/15 0831     Chief Complaint  Patient presents with  . Nausea  . Emesis  . Abdominal Pain     (Consider location/radiation/quality/duration/timing/severity/associated sxs/prior Treatment) HPI Comments: 930/10PM last night began to have nausea and abdominal pain Took zofran, tylenolx2, 4mg  dilaudid from last year which eased it a little bit but tried to go to work this AM.  [place] to eat breakfast but threw it up.  Constant nausea, feeling clammy, abdominal pain.  RUQ abdominal pain, stays in RUQ, no radiation. Sharp cramping pain, worse with eating but constant since last night.  No CP/SOB, No diarrhea (did have these symptoms Monday night when presented)   Patient is a 50 y.o. male presenting with vomiting and abdominal pain.  Emesis Associated symptoms: abdominal pain   Associated symptoms: no diarrhea, no headaches and no sore throat   Abdominal Pain Associated symptoms: nausea and vomiting   Associated symptoms: no chest pain, no diarrhea, no fever, no shortness of breath and no sore throat     Past Medical History  Diagnosis Date  . CAD (coronary artery disease)   . Elevated liver enzymes   . Diverticulitis     Hx of; requiring 3 admissions  . Sigmoid colon ulcer     Rectal polyps  . Psoriasis     Per medical history form dated 06/13/10.  . Chronic insomnia     Per medical history form dated 06/13/10.  . Colitis     Per medical history from dated 06/13/10.  Marland Kitchen Hypertension   . Hypothyroidism   . Bronchitis     history of  . Back pain     chronic  . Arthritis   . 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 Sleep apnea with use of continuous positive airway pressure (CPAP)    Past Surgical History  Procedure Laterality Date  . Colonoscopy  07/2008    Colitis,NSAID v. Ischemia,malrotation of the gut,Diverticulosis(L),hyperplastic  .  Appendectomy      8th grade  . Shoulder surgery      Left  . Rotator cuff repair      Left - per medical history form dated 06/13/10.  . Thyroidectomy      Per medical history form dated 06/13/10.  . Laparoscopic sigmoid colectomy  2012    Dr. Fanny Skates: recurrent sigmoid diverticulitis  . Total hip arthroplasty  07/04/2011    Procedure: TOTAL HIP ARTHROPLASTY;  Surgeon: Johnny Bridge, MD;  Location: [place];  Service: Orthopedics;  Laterality: Right;  . Joint replacement    . Cardiac catheterization  2009  . Cardiac catheterization      Per medical history from dated 06/13/10.  . Colonoscopy N/A 12/13/2012    Dr. Oneida Alar: Normal TI, mild sigmoid diverticulosis, hemorrhoids, 2 polyps (tubular adenoma). Random colon bx negative. Next TCS 12/2022 with Fentanyl/phenergan  . Esophagogastroduodenoscopy N/A 04/16/2014    RMR: Erosive reflux esophagitis. Non critical Schzki's ring not manipulated. Small hiatal hernia. Abnormal gastirc mucosa of doubtful signigicance status post biopsy. I suspect trivial upper GI bleed. Recent abdominal pain presumably secondary to a protracted bout of diverticulitis. CT scan November 23 revealed improvemetn without complication. He finished his antibiotics 2 days age. Left sided ab  . Cervical spine surgery      C4, C5, C6 spinal fusion  . Laparoscopic sigmoid colectomy  2012  recurernt sigmoid diverticulitis, Dr. Dalbert Batman    Family History  Problem Relation Age of Onset  . Cancer Mother     breast cancer - per medical history form dated 06/13/10.  . Diverticulitis Mother   . Hypertension Mother   . Cancer Father     skin - per medical history form dated 06/13/10.  Marland Kitchen Hypertension Father   . Anesthesia problems Neg Hx   . Hypotension Neg Hx   . Malignant hyperthermia Neg Hx   . Pseudochol deficiency Neg Hx   . Colon cancer Neg Hx    Social History  Substance Use Topics  . Smoking status: Current Some Day Smoker -- 0.50 packs/day for 18 years    Types:  Cigarettes    Last Attempt to Quit: 08/15/2012  . Smokeless tobacco: Never Used     Comment:    . Alcohol Use: 0.0 oz/week    0 Standard drinks or equivalent per week     Comment: Occasional    Review of Systems  Constitutional: Negative for fever.  HENT: Negative for sore throat.   Eyes: Negative for visual disturbance.  Respiratory: Negative for shortness of breath.   Cardiovascular: Negative for chest pain.  Gastrointestinal: Positive for nausea, vomiting and abdominal pain. Negative for diarrhea.  Genitourinary: Negative for difficulty urinating.  Musculoskeletal: Negative for back pain and neck stiffness.  Skin: Negative for rash.  Neurological: Negative for syncope and headaches.      Allergies  Other and Iohexol  Home Medications   Prior to Admission medications   Medication Sig Start Date End Date Taking? Authorizing Provider  acetaminophen (TYLENOL) 500 MG tablet Take 1,000 mg by mouth every 6 (six) hours as needed for mild pain, moderate pain, fever or headache.   Yes Historical Provider, MD  cyclobenzaprine (FLEXERIL) 10 MG tablet Take 1 tablet (10 mg total) by mouth 3 (three) times daily as needed. Patient taking differently: Take 10 mg by mouth 3 (three) times daily as needed for muscle spasms.  04/27/15  Yes Rolland Porter, MD  diphenhydrAMINE (BENADRYL) 25 MG tablet Take 50 mg by mouth every 6 (six) hours as needed for allergies.    Yes Historical Provider, MD  EPINEPHrine (EPIPEN) 0.3 mg/0.3 mL SOAJ Inject 0.3 mLs (0.3 mg total) into the muscle as needed. Patient taking differently: Inject 0.3 mg into the muscle daily as needed (allergic reaction).  12/01/12  Yes Teressa Lower, MD  escitalopram (LEXAPRO) 10 MG tablet Take 10 mg by mouth daily. 04/28/15  Yes Historical Provider, MD  HYDROmorphone (DILAUDID) 4 MG tablet Take 4 mg by mouth every 4 (four) hours as needed for moderate pain or severe pain.   Yes Historical Provider, MD  levothyroxine (SYNTHROID, LEVOTHROID)  300 MCG tablet Take 300 mcg by mouth daily. 03/26/14  Yes Historical Provider, MD  meloxicam (MOBIC) 7.5 MG tablet Take 7.5 mg by mouth 2 (two) times daily. 08/10/15  Yes Historical Provider, MD  olmesartan (BENICAR) 20 MG tablet Take 20 mg by mouth daily.    Yes Historical Provider, MD  ondansetron (ZOFRAN ODT) 8 MG disintegrating tablet Take 1 tablet (8 mg total) by mouth every 8 (eight) hours as needed for nausea or vomiting. 08/25/15  Yes Kathie Dike, MD  oxyCODONE-acetaminophen (PERCOCET) 10-325 MG tablet Take 0.5-1 tablets by mouth daily as needed for pain.   Yes Historical Provider, MD  oxymetazoline (AFRIN) 0.05 % nasal spray Place 1 spray into both nostrils 2 (two) times daily as needed for congestion.  Yes Historical Provider, MD  pantoprazole (PROTONIX) 40 MG tablet Take 1 tablet (40 mg total) by mouth 2 (two) times daily before a meal. Take 1 tablet by mouth 2 time daily before a meal for 30 days then take 1 tab daily before a meal Patient taking differently: Take 40 mg by mouth daily.  07/15/14  Yes Danie Binder, MD  polyethylene glycol (MIRALAX / GLYCOLAX) packet Take 17 g by mouth daily as needed for mild constipation. 08/25/15  Yes Kathie Dike, MD  zolpidem (AMBIEN) 10 MG tablet Take 10 mg by mouth at bedtime.    Yes Historical Provider, MD   BP 104/59 mmHg  Pulse 53  Temp(Src) 97.8 F (36.6 C) (Oral)  Resp 18  Ht 6\' 1"  (1.854 m)  Wt 235 lb (106.595 kg)  BMI 31.01 kg/m2  SpO2 97% Physical Exam  Constitutional: He is oriented to person, place, and time. He appears well-developed and well-nourished. No distress.  HENT:  Head: Normocephalic and atraumatic.  Eyes: Conjunctivae and EOM are normal.  Neck: Normal range of motion.  Cardiovascular: Normal rate, regular rhythm, normal heart sounds and intact distal pulses.  Exam reveals no gallop and no friction rub.   No murmur heard. Pulmonary/Chest: Effort normal and breath sounds normal. No respiratory distress. He has no  wheezes. He has no rales.  Abdominal: Soft. He exhibits no distension. There is tenderness. There is positive Murphy's sign. There is no guarding and no tenderness at McBurney's point.  Musculoskeletal: He exhibits no edema.  Neurological: He is alert and oriented to person, place, and time.  Skin: Skin is warm and dry. He is not diaphoretic.  Nursing note and vitals reviewed.   ED Course  Procedures (including critical care time) Labs Review Labs Reviewed  COMPREHENSIVE METABOLIC PANEL - Abnormal; Notable for the following:    CO2 21 (*)    Glucose, Bld 110 (*)    ALT 70 (*)    All other components within normal limits  URINE CULTURE  SURGICAL PCR SCREEN  LIPASE, BLOOD  CBC WITH DIFFERENTIAL/PLATELET  URINALYSIS, ROUTINE W REFLEX MICROSCOPIC (NOT AT [facility])  HEMOGLOBIN A1C  COMPREHENSIVE METABOLIC PANEL  CBC  I-STAT TROPOININ, ED    Imaging Review No results found. I have personally reviewed and evaluated these images and lab results as part of my medical decision-making.   EKG Interpretation   Date/Time:  Monday August 30 2015 09:02:44 EDT Ventricular Rate:  72 PR Interval:  147 QRS Duration: 93 QT Interval:  392 QTC Calculation: 429 R Axis:   2 Text Interpretation:  Sinus rhythm Probable lateral infarct, old No  significant change since last tracing Confirmed by [facility] MD, [place]  ([id number]) on 08/30/2015 10:01:26 AM      MDM   Final diagnoses:  Symptomatic cholelithiasis   50yo male with history of CAD, htn, diverticulitis with partial colectomy with Dr. Dalbert Batman, appendectomy 8th grade presents with concern for RUQ pain. Patient recently admitted to [facility] with finding of cholelithiasis, and had cardiac work up and felt to have gastroenteritis. Now pt denies CP, no diarrhea, however has had worsening abdominal pain and nausea and vomiting particularly after eating, concerning for symptomatic cholelithiasis. Consulted Surgery given symptoms, recent admission and  discharge.  Surgery admitted pt for continued care.  BP decreased following morphine, however improved again and given IV fluids for suspected dehydration.   Gareth Morgan, MD 08/30/15 2225

## 2015-08-30 NOTE — Consult Note (Signed)
CARDIOLOGY CONSULT NOTE   Patient ID: Andrew Love MRN: [id number], DOB/AGE: 49-09-1965   Admit date: 08/30/2015 Date of Consult: 08/30/2015   Primary Physician: [name], MD Primary Cardiologist: new (previous Dr. Lattie Haw and Dr. Einar Gip)  Pt. Profile  Andrew Love is a pleasant obese 50 yo male with PMH of minimal CAD, tobacco abuse, hypothyroidism, HTN and OSA partially compliant with CPAP who presented with RUQ felt to be symptomatic cholelithiasis. He also has been having intermittent left-sided sharp chest pain since last Monday, cardiology consulted for preoperative clearance.  Problem List  Past Medical History  Diagnosis Date  . CAD (coronary artery disease)   . Elevated liver enzymes   . Diverticulitis     Hx of; requiring 3 admissions  . Sigmoid colon ulcer     Rectal polyps  . Psoriasis     Per medical history form dated 06/13/10.  . Chronic insomnia     Per medical history form dated 06/13/10.  . Colitis     Per medical history from dated 06/13/10.  Marland Kitchen Hypertension   . Hypothyroidism   . Bronchitis     history of  . Back pain     chronic  . Arthritis   . 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 Sleep apnea with use of continuous positive airway pressure (CPAP)     Past Surgical History  Procedure Laterality Date  . Colonoscopy  07/2008    Colitis,NSAID v. Ischemia,malrotation of the gut,Diverticulosis(L),hyperplastic  . Appendectomy      8th grade  . Shoulder surgery      Left  . Rotator cuff repair      Left - per medical history form dated 06/13/10.  . Thyroidectomy      Per medical history form dated 06/13/10.  . Laparoscopic sigmoid colectomy  2012    Dr. Fanny Skates: recurrent sigmoid diverticulitis  . Total hip arthroplasty  07/04/2011    Procedure: TOTAL HIP ARTHROPLASTY;  Surgeon: Johnny Bridge, MD;  Location: [place];  Service: Orthopedics;  Laterality: Right;  . Joint replacement    . Cardiac catheterization   2009  . Cardiac catheterization      Per medical history from dated 06/13/10.  . Colonoscopy N/A 12/13/2012    Dr. Oneida Alar: Normal TI, mild sigmoid diverticulosis, hemorrhoids, 2 polyps (tubular adenoma). Random colon bx negative. Next TCS 12/2022 with Fentanyl/phenergan  . Esophagogastroduodenoscopy N/A 04/16/2014    RMR: Erosive reflux esophagitis. Non critical Schzki's ring not manipulated. Small hiatal hernia. Abnormal gastirc mucosa of doubtful signigicance status post biopsy. I suspect trivial upper GI bleed. Recent abdominal pain presumably secondary to a protracted bout of diverticulitis. CT scan November 23 revealed improvemetn without complication. He finished his antibiotics 2 days age. Left sided ab  . Cervical spine surgery      C4, C5, C6 spinal fusion  . Laparoscopic sigmoid colectomy  2012    recurernt sigmoid diverticulitis, Dr. Dalbert Batman      Allergies  Allergies  Allergen Reactions  . Other Hives, Shortness Of Breath and Itching    Insect bite- bug name unknown=  . Iohexol Hives    HPI   Andrew Love is a pleasant obese 50 yo male with PMH of minimal CAD, tobacco abuse, hypothyroidism, HTN and OSA partially compliant with CPAP. As part of chest pain evaluation, he underwent cardiac cath by Dr. Einar Gip on 02/10/2008 Shows EF 55-60%, 30% mid RCA stenosis, slow filling noted with the improved intra-coronary nitroglycerin suggestive  of microvascular spasm and/or microvascular disease. He was followed for a short period of time by [facility] and Vascular, but later switched to Dr. Lattie Haw with [facility] cardiology. He was last seen by Dr. Lattie Haw for chest pain on 11/28/2011, he was having significant chest discomfort at the time. It appears patient was originally planned for stress echocardiogram, however by the time he was seen on the following day, he symptom has resolved, he did have an stress echo performed on 7/24 which was negative. He had an negative stress test on 09/24/2012  with EF 55%, no ischemia or infarction. He has not had any further cardiology follow-up since. Last echo obtained on 03/21/2014 showed EF 60-65%, no RWMA, trivial MR.   He says before his neck surgery in the summer of 2016, he was fairly active going to the gym twice a week and the playing golf on a weekly basis. His activity level has since been fairly limited. He did go to the golf course and played for 3-1/2 to 4 hours last Monday without any chest discomfort. He has chronic bilateral lower extremity pain at rest. He denies any prior history of CAD. The bilateral lower extremity pain is below the knees and seems to have occurred together in the past. He does have intermittent chest discomfort which he usually attributed to acid reflux. Since last Monday's game, he did have more frequent left-sided sharp shooting pain associated with some degree of shortness of breath. It is relieved with burping. Chest palpation and deep inspiration does not affect it. He was later hospitalized at [facility] on 4/18-4/20 with gastroenteritis. Symptoms included nausea, vomiting and diarrhea. He presented to [facility] again on 4/24 with onset of right upper quadrant pain. It was felt patient likely has symptomatic cholecystolithiasis. Limited ultrasound of the abdomen obtained during the previous admission on 4/18 showed hepatic steatosis, gallbladder sludge and a small gallstone. There was no evidence of cholecystitis at the time. Cardiology has been consulted for preoperative clearance.   Inpatient Medications  . cefTRIAXone (ROCEPHIN)  IV  2 g Intravenous Q24H  . enoxaparin (LOVENOX) injection  40 mg Subcutaneous Q24H  . escitalopram  10 mg Oral Daily  . [START ON 08/31/2015] irbesartan  150 mg Oral Daily  . [START ON 08/31/2015] levothyroxine  300 mcg Oral QAC breakfast  . pantoprazole  40 mg Oral Daily  . zolpidem  10 mg Oral QHS    Family History Family History  Problem Relation Age of  Onset  . Cancer Mother     breast cancer - per medical history form dated 06/13/10.  . Diverticulitis Mother   . Hypertension Mother   . Cancer Father     skin - per medical history form dated 06/13/10.  Marland Kitchen Hypertension Father   . Anesthesia problems Neg Hx   . Hypotension Neg Hx   . Malignant hyperthermia Neg Hx   . Pseudochol deficiency Neg Hx   . Colon cancer Neg Hx      Social History Social History   Social History  . Marital Status: Married    Spouse Name: N/A  . Number of Children: N/A  . Years of Education: N/A   Occupational History  . [employer] Unemployed   Social History Main Topics  . Smoking status: Current Some Day Smoker -- 0.50 packs/day for 18 years    Types: Cigarettes    Last Attempt to Quit: 08/15/2012  . Smokeless tobacco: Never Used     Comment:    .  Alcohol Use: 0.0 oz/week    0 Standard drinks or equivalent per week     Comment: Occasional  . Drug Use: No  . Sexual Activity: Not on file   Other Topics Concern  . 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   Married     Review of Systems  General:  No chills, fever, night sweats or weight changes.  Cardiovascular:  No dyspnea on exertion, edema, orthopnea, palpitations, paroxysmal nocturnal dyspnea. +sharp L sided chest pain Dermatological: No rash, lesions/masses Respiratory: No cough, dyspnea Urologic: No hematuria, dysuria Abdominal:   No bright red blood per rectum, melena, or hematemesis +nausea, vomiting, diarrhea, RUQ pain Neurologic:  No visual changes, wkns, changes in mental status. All other systems reviewed and are otherwise negative except as noted above.  Physical Exam  Blood pressure 100/61, pulse 53, temperature 97.5 F (36.4 C), temperature source Oral, resp. rate 20, height 6\' 1"  (1.854 m), weight 235 lb (106.595 kg), SpO2 97 %.  General: Pleasant, NAD Psych: Normal affect. Neuro: Alert and oriented X 3. Moves all  extremities spontaneously. HEENT: Normal  Neck: Supple without bruits or JVD. Lungs:  Resp regular and unlabored, CTA. Heart: RRR no s3, s4, or murmurs. Abdomen: Soft, non-tender, non-distended, BS + x 4.  Extremities: No clubbing, cyanosis or edema. DP/PT/Radials 2+ and equal bilaterally.  Labs  No results for input(s): CKTOTAL, CKMB, TROPONINI in the last 72 hours. Lab Results  Component Value Date   WBC 9.7 08/30/2015   HGB 16.2 08/30/2015   HCT 46.0 08/30/2015   MCV 91.5 08/30/2015   PLT 252 08/30/2015    Recent Labs Lab 08/30/15 0857  NA 136  K 3.5  CL 104  CO2 21*  BUN 15  CREATININE 1.02  CALCIUM 9.0  PROT 7.3  BILITOT 0.3  ALKPHOS 74  ALT 70*  AST 38  GLUCOSE 110*   Lab Results  Component Value Date   CHOL 183 03/21/2014   HDL 26* 03/21/2014   LDLCALC 79 03/21/2014   TRIG 390* 03/21/2014   Lab Results  Component Value Date   DDIMER 0.23 11/28/2011    Radiology/Studies  Ct Abdomen Pelvis Wo Contrast  08/24/2015  CLINICAL DATA:  Chest pain under the left breast an radiating to the mid chest with onset 12 hours ago. Diaphoresis, nausea, vomiting, abdominal distention, left back pain, cough, shortness of breath, dyspnea on exertion cava and diarrhea. Vomiting. EXAM: CT CHEST, ABDOMEN AND PELVIS WITHOUT CONTRAST TECHNIQUE: Multidetector CT imaging of the chest, abdomen and pelvis was performed following the standard protocol without IV contrast. Evaluation of solid organs and vascular structures is limited without IV contrast material. COMPARISON:  None. FINDINGS: CT CHEST FINDINGS Mediastinum/Lymph Nodes: No masses or pathologically enlarged lymph nodes identified on this un-enhanced exam. Surgical absence of the thyroid gland. Lungs/Pleura: No pulmonary mass, infiltrate, or effusion. Musculoskeletal: No chest wall mass or suspicious bone lesions identified. CT ABDOMEN PELVIS FINDINGS Hepatobiliary: No mass visualized on this un-enhanced exam. Tiny stone seen in  the gallbladder. No bile duct dilatation. Pancreas: No mass or inflammatory process identified on this un-enhanced exam. Spleen: Within normal limits in size. Adrenals/Urinary Tract: No evidence of urolithiasis or hydronephrosis. No definite mass visualized on this un-enhanced exam. Stomach/Bowel: No evidence of obstruction, inflammatory process, or abnormal fluid collections. Vascular/Lymphatic: No pathologically enlarged lymph nodes. No evidence of abdominal aortic aneurysm. Reproductive: No mass or other significant abnormality. Other: None. Musculoskeletal: No suspicious  bone lesions identified. Right hip arthroplasty. IMPRESSION: No acute process demonstrated in the chest, abdomen, or pelvis on noncontrast imaging. Electronically Signed   By: Lucienne Capers M.D.   On: 08/24/2015 04:55   Ct Chest Wo Contrast  08/24/2015  CLINICAL DATA:  Chest pain under the left breast an radiating to the mid chest with onset 12 hours ago. Diaphoresis, nausea, vomiting, abdominal distention, left back pain, cough, shortness of breath, dyspnea on exertion cava and diarrhea. Vomiting. EXAM: CT CHEST, ABDOMEN AND PELVIS WITHOUT CONTRAST TECHNIQUE: Multidetector CT imaging of the chest, abdomen and pelvis was performed following the standard protocol without IV contrast. Evaluation of solid organs and vascular structures is limited without IV contrast material. COMPARISON:  None. FINDINGS: CT CHEST FINDINGS Mediastinum/Lymph Nodes: No masses or pathologically enlarged lymph nodes identified on this un-enhanced exam. Surgical absence of the thyroid gland. Lungs/Pleura: No pulmonary mass, infiltrate, or effusion. Musculoskeletal: No chest wall mass or suspicious bone lesions identified. CT ABDOMEN PELVIS FINDINGS Hepatobiliary: No mass visualized on this un-enhanced exam. Tiny stone seen in the gallbladder. No bile duct dilatation. Pancreas: No mass or inflammatory process identified on this un-enhanced exam. Spleen: Within  normal limits in size. Adrenals/Urinary Tract: No evidence of urolithiasis or hydronephrosis. No definite mass visualized on this un-enhanced exam. Stomach/Bowel: No evidence of obstruction, inflammatory process, or abnormal fluid collections. Vascular/Lymphatic: No pathologically enlarged lymph nodes. No evidence of abdominal aortic aneurysm. Reproductive: No mass or other significant abnormality. Other: None. Musculoskeletal: No suspicious bone lesions identified. Right hip arthroplasty. IMPRESSION: No acute process demonstrated in the chest, abdomen, or pelvis on noncontrast imaging. Electronically Signed   By: Lucienne Capers M.D.   On: 08/24/2015 04:55   US Abdomen Limited Ruq  08/24/2015  CLINICAL DATA:  Nausea for 12 hours with diarrhea. EXAM: US ABDOMEN LIMITED - RIGHT UPPER QUADRANT COMPARISON:  CT 08/24/2015. FINDINGS: Gallbladder: There is gallbladder sludge and a tiny non shadowing calculus. No gallbladder wall thickening or sonographic Murphy's sign. Common bile duct: Diameter: 5.5 mm Liver: The hepatic echogenicity is increased, corresponding with decreased density on CT, consistent with steatosis. No focal abnormalities identified. IMPRESSION: 1. No acute right upper quadrant abdominal findings. 2. Gallbladder sludge and small gallstone. No evidence of cholecystitis or biliary dilatation. 3. Hepatic steatosis. Electronically Signed   By: Richardean Sale M.D.   On: 08/24/2015 07:57    ECG  Normal sinus rhythm without significant ST-T wave changes.  ASSESSMENT AND PLAN  1. Preop clearance for symptomatic cholecystolithiasis and atypical chest pain  - He has been having some left-sided chest discomfort since his golf game last Monday, he says they're relieved with burping. Somewhat atypical in symptom. It does not appears to have exertional component, at least he never had any chest pain during the game.  - Will discuss with M.D., potentially obtain Lexiscan Myoview before clearance.  2.  Minimal CAD on cath 2009  - cardiac cath by Dr. Einar Gip on 02/10/2008 Shows EF 55-60%, 30% mid RCA stenosis, slow filling noted with the improved intra-coronary nitroglycerin suggestive of microvascular spasm and/or microvascular disease  3. tobacco abuse  4. Hypothyroidism 5. HTN  6. OSA partially compliant with CPAP   Signed, Almyra Deforest, PA-C 08/30/2015, 3:14 PM

## 2015-08-31 ENCOUNTER — Inpatient Hospital Stay (HOSPITAL_COMMUNITY): Payer: Commercial Managed Care - HMO

## 2015-08-31 ENCOUNTER — Inpatient Hospital Stay (HOSPITAL_COMMUNITY): Payer: Commercial Managed Care - HMO | Admitting: Anesthesiology

## 2015-08-31 ENCOUNTER — Encounter (HOSPITAL_COMMUNITY)
Admission: EM | Disposition: A | Discharge status: Home / Self Care | Payer: Self-pay | Source: Home / Self Care | Attending: Emergency Medicine

## 2015-08-31 ENCOUNTER — Encounter (HOSPITAL_COMMUNITY): Payer: Self-pay | Admitting: Anesthesiology

## 2015-08-31 HISTORY — PX: CHOLECYSTECTOMY: SHX55

## 2015-08-31 LAB — COMPREHENSIVE METABOLIC PANEL
ALT: 63 U/L (ref 17–63)
AST: 36 U/L (ref 15–41)
Albumin: 3.7 g/dL (ref 3.5–5.0)
Alkaline Phosphatase: 62 U/L (ref 38–126)
Anion gap: 8 (ref 5–15)
BUN: 13 mg/dL (ref 6–20)
CO2: 25 mmol/L (ref 22–32)
Calcium: 8.6 mg/dL — ABNORMAL LOW (ref 8.9–10.3)
Chloride: 108 mmol/L (ref 101–111)
Creatinine, Ser: 1.02 mg/dL (ref 0.61–1.24)
GFR calc Af Amer: >60 mL/min (ref >60)
GFR calc non Af Amer: >60 mL/min (ref >60)
Glucose, Bld: 98 mg/dL (ref 65–99)
Potassium: 3.9 mmol/L (ref 3.5–5.1)
Sodium: 141 mmol/L (ref 135–145)
Total Bilirubin: 1.2 mg/dL (ref 0.3–1.2)
Total Protein: 6 g/dL — ABNORMAL LOW (ref 6.5–8.1)

## 2015-08-31 LAB — CBC
HCT: 40.3 % (ref 39.0–52.0)
Hemoglobin: 14 g/dL (ref 13.0–17.0)
MCH: 31.5 pg (ref 26.0–34.0)
MCHC: 34.7 g/dL (ref 30.0–36.0)
MCV: 90.8 fL (ref 78.0–100.0)
Platelets: 194 10*3/uL (ref 150–400)
RBC: 4.44 MIL/uL (ref 4.22–5.81)
RDW: 12.5 % (ref 11.5–15.5)
WBC: 6.9 10*3/uL (ref 4.0–10.5)

## 2015-08-31 LAB — HEMOGLOBIN A1C
Hgb A1c MFr Bld: 5.2 % (ref 4.8–5.6)
Mean Plasma Glucose: 103 mg/dL

## 2015-08-31 SURGERY — LAPAROSCOPIC CHOLECYSTECTOMY WITH INTRAOPERATIVE CHOLANGIOGRAM
Anesthesia: General

## 2015-08-31 MED ORDER — MIDAZOLAM HCL 2 MG/2ML IJ SOLN
INTRAMUSCULAR | Status: AC
  Filled 2015-08-31: qty 2

## 2015-08-31 MED ORDER — EPHEDRINE SULFATE 50 MG/ML IJ SOLN
INTRAMUSCULAR | Status: DC | PRN
  Administered 2015-08-31: 10 mg via INTRAVENOUS

## 2015-08-31 MED ORDER — PROCHLORPERAZINE EDISYLATE 5 MG/ML IJ SOLN
INTRAMUSCULAR | Status: AC
  Filled 2015-08-31: qty 2

## 2015-08-31 MED ORDER — LIDOCAINE HCL (CARDIAC) 20 MG/ML IV SOLN
INTRAVENOUS | Status: AC
  Filled 2015-08-31: qty 5

## 2015-08-31 MED ORDER — SUGAMMADEX SODIUM 200 MG/2ML IV SOLN
INTRAVENOUS | Status: DC | PRN
  Administered 2015-08-31: 200 mg via INTRAVENOUS

## 2015-08-31 MED ORDER — IOPAMIDOL (ISOVUE-300) INJECTION 61%
INTRAVENOUS | Status: DC | PRN
  Administered 2015-08-31: 2.5 mL

## 2015-08-31 MED ORDER — FENTANYL CITRATE (PF) 100 MCG/2ML IJ SOLN
INTRAMUSCULAR | Status: DC | PRN
  Administered 2015-08-31: 100 ug via INTRAVENOUS
  Administered 2015-08-31 (×3): 50 ug via INTRAVENOUS

## 2015-08-31 MED ORDER — LACTATED RINGERS IV SOLN
INTRAVENOUS | Status: DC | PRN
  Administered 2015-08-31: 09:00:00 via INTRAVENOUS

## 2015-08-31 MED ORDER — HYDROMORPHONE HCL 1 MG/ML IJ SOLN
INTRAMUSCULAR | Status: AC
  Filled 2015-08-31: qty 1

## 2015-08-31 MED ORDER — LACTATED RINGERS IV SOLN
INTRAVENOUS | Status: DC | PRN
  Administered 2015-08-31: 1000 mL

## 2015-08-31 MED ORDER — HEMOSTATIC AGENTS (NO CHARGE) OPTIME
TOPICAL | Status: DC | PRN
  Administered 2015-08-31: 1 via TOPICAL

## 2015-08-31 MED ORDER — HYDROMORPHONE HCL 1 MG/ML IJ SOLN
0.2500 mg | INTRAMUSCULAR | Status: DC | PRN
  Administered 2015-08-31 (×2): 0.5 mg via INTRAVENOUS

## 2015-08-31 MED ORDER — PROPOFOL 10 MG/ML IV BOLUS
INTRAVENOUS | Status: AC
  Filled 2015-08-31: qty 20

## 2015-08-31 MED ORDER — BUPIVACAINE-EPINEPHRINE 0.25% -1:200000 IJ SOLN
INTRAMUSCULAR | Status: DC | PRN
  Administered 2015-08-31: 11 mL

## 2015-08-31 MED ORDER — OXYCODONE HCL 5 MG PO TABS
10.0000 mg | ORAL_TABLET | ORAL | Status: DC | PRN
  Administered 2015-08-31 – 2015-09-01 (×2): 10 mg via ORAL
  Filled 2015-08-31 (×2): qty 2

## 2015-08-31 MED ORDER — ROCURONIUM BROMIDE 100 MG/10ML IV SOLN
INTRAVENOUS | Status: DC | PRN
  Administered 2015-08-31: 5 mg via INTRAVENOUS
  Administered 2015-08-31: 35 mg via INTRAVENOUS

## 2015-08-31 MED ORDER — DEXAMETHASONE SODIUM PHOSPHATE 10 MG/ML IJ SOLN
INTRAMUSCULAR | Status: AC
  Filled 2015-08-31: qty 1

## 2015-08-31 MED ORDER — MIDAZOLAM HCL 5 MG/5ML IJ SOLN
INTRAMUSCULAR | Status: DC | PRN
  Administered 2015-08-31: 2 mg via INTRAVENOUS

## 2015-08-31 MED ORDER — LIDOCAINE HCL (CARDIAC) 20 MG/ML IV SOLN
INTRAVENOUS | Status: DC | PRN
  Administered 2015-08-31: 50 mg via INTRAVENOUS

## 2015-08-31 MED ORDER — DEXAMETHASONE SODIUM PHOSPHATE 10 MG/ML IJ SOLN
INTRAMUSCULAR | Status: DC | PRN
  Administered 2015-08-31: 10 mg via INTRAVENOUS

## 2015-08-31 MED ORDER — BUPIVACAINE-EPINEPHRINE (PF) 0.25% -1:200000 IJ SOLN
INTRAMUSCULAR | Status: AC
  Filled 2015-08-31: qty 30

## 2015-08-31 MED ORDER — PROCHLORPERAZINE EDISYLATE 5 MG/ML IJ SOLN
10.0000 mg | Freq: Once | INTRAMUSCULAR | Status: AC | PRN
  Administered 2015-08-31: 10 mg via INTRAVENOUS

## 2015-08-31 MED ORDER — ONDANSETRON HCL 4 MG/2ML IJ SOLN
INTRAMUSCULAR | Status: DC | PRN
  Administered 2015-08-31: 4 mg via INTRAVENOUS

## 2015-08-31 MED ORDER — FENTANYL CITRATE (PF) 250 MCG/5ML IJ SOLN
INTRAMUSCULAR | Status: AC
  Filled 2015-08-31: qty 5

## 2015-08-31 MED ORDER — ONDANSETRON HCL 4 MG/2ML IJ SOLN
INTRAMUSCULAR | Status: AC
  Filled 2015-08-31: qty 2

## 2015-08-31 MED ORDER — LIP MEDEX EX OINT
TOPICAL_OINTMENT | CUTANEOUS | Status: AC
  Administered 2015-08-31: 18:00:00
  Filled 2015-08-31: qty 7

## 2015-08-31 MED ORDER — LACTATED RINGERS IV SOLN: INTRAVENOUS | Status: DC

## 2015-08-31 MED ORDER — SUCCINYLCHOLINE CHLORIDE 20 MG/ML IJ SOLN
INTRAMUSCULAR | Status: DC | PRN
  Administered 2015-08-31: 160 mg via INTRAVENOUS

## 2015-08-31 MED ORDER — IOPAMIDOL (ISOVUE-300) INJECTION 61%
INTRAVENOUS | Status: AC
Start: 2015-08-31 — End: 2015-08-31
  Filled 2015-08-31: qty 50

## 2015-08-31 MED ORDER — 0.9 % SODIUM CHLORIDE (POUR BTL) OPTIME
TOPICAL | Status: DC | PRN
  Administered 2015-08-31: 1000 mL

## 2015-08-31 MED ORDER — ROCURONIUM BROMIDE 100 MG/10ML IV SOLN
INTRAVENOUS | Status: AC
  Filled 2015-08-31: qty 1

## 2015-08-31 MED ORDER — PROPOFOL 10 MG/ML IV BOLUS
INTRAVENOUS | Status: DC | PRN
  Administered 2015-08-31: 200 mg via INTRAVENOUS

## 2015-08-31 MED ORDER — EPHEDRINE SULFATE 50 MG/ML IJ SOLN
INTRAMUSCULAR | Status: AC
  Filled 2015-08-31: qty 1

## 2015-08-31 SURGICAL SUPPLY — 39 items
APL SRG 38 LTWT LNG FL B (MISCELLANEOUS) ×1
APPLICATOR ARISTA FLEXITIP XL (MISCELLANEOUS) ×1 IMPLANT
APPLIER CLIP ROT 10 11.4 M/L (STAPLE) ×2
APR CLP MED LRG 11.4X10 (STAPLE) ×1
BAG SPEC RTRVL LRG 6X4 10 (ENDOMECHANICALS) ×1
CABLE HIGH FREQUENCY MONO STRZ (ELECTRODE) ×2 IMPLANT
CATH REDDICK CHOLANGI 4FR 50CM (CATHETERS) ×1 IMPLANT
CHLORAPREP W/TINT 26ML (MISCELLANEOUS) ×2 IMPLANT
CLIP APPLIE ROT 10 11.4 M/L (STAPLE) ×1 IMPLANT
COVER MAYO STAND STRL (DRAPES) ×2 IMPLANT
COVER SURGICAL LIGHT HANDLE (MISCELLANEOUS) ×1 IMPLANT
DECANTER SPIKE VIAL GLASS SM (MISCELLANEOUS) ×2 IMPLANT
DRAPE C-ARM 42X120 X-RAY (DRAPES) ×2 IMPLANT
DRAPE LAPAROSCOPIC ABDOMINAL (DRAPES) ×2 IMPLANT
ELECT REM PT RETURN 9FT ADLT (ELECTROSURGICAL) ×2
ELECTRODE REM PT RTRN 9FT ADLT (ELECTROSURGICAL) ×1 IMPLANT
GLOVE BIOGEL PI IND STRL 7.5 (GLOVE) ×1 IMPLANT
GLOVE BIOGEL PI INDICATOR 7.5 (GLOVE) ×1
GLOVE ECLIPSE 7.5 STRL STRAW (GLOVE) ×2 IMPLANT
GOWN STRL REUS W/TWL XL LVL3 (GOWN DISPOSABLE) ×8 IMPLANT
HEMOSTAT ARISTA ABSORB 3G PWDR (MISCELLANEOUS) ×1 IMPLANT
HEMOSTAT SNOW SURGICEL 2X4 (HEMOSTASIS) IMPLANT
HEMOSTAT SURGICEL 4X8 (HEMOSTASIS) IMPLANT
KIT BASIN OR (CUSTOM PROCEDURE TRAY) ×2 IMPLANT
LIQUID BAND (GAUZE/BANDAGES/DRESSINGS) ×2 IMPLANT
POSITIONER SURGICAL ARM (MISCELLANEOUS) IMPLANT
POUCH SPECIMEN RETRIEVAL 10MM (ENDOMECHANICALS) ×1 IMPLANT
SCISSORS LAP 5X35 DISP (ENDOMECHANICALS) ×2 IMPLANT
SET CHOLANGIOGRAPH MIX (MISCELLANEOUS) ×2 IMPLANT
SET IRRIG TUBING LAPAROSCOPIC (IRRIGATION / IRRIGATOR) ×2 IMPLANT
SLEEVE XCEL OPT CAN 5 100 (ENDOMECHANICALS) ×2 IMPLANT
SUT MNCRL AB 4-0 PS2 18 (SUTURE) ×2 IMPLANT
TAPE CLOTH 4X10 WHT NS (GAUZE/BANDAGES/DRESSINGS) IMPLANT
TOWEL OR 17X26 10 PK STRL BLUE (TOWEL DISPOSABLE) ×2 IMPLANT
TRAY LAPAROSCOPIC (CUSTOM PROCEDURE TRAY) ×2 IMPLANT
TROCAR BLADELESS OPT 5 100 (ENDOMECHANICALS) ×2 IMPLANT
TROCAR XCEL BLUNT TIP 100MML (ENDOMECHANICALS) ×2 IMPLANT
TROCAR XCEL NON-BLD 11X100MML (ENDOMECHANICALS) ×2 IMPLANT
TUBING INSUF HEATED (TUBING) ×2 IMPLANT

## 2015-08-31 NOTE — Transfer of Care (Signed)
Immediate Anesthesia Transfer of Care Note  Patient: Andrew Love  Procedure(s) Performed: Procedure(s): LAPAROSCOPIC CHOLECYSTECTOMY WITH INTRAOPERATIVE CHOLANGIOGRAM (N/A)  Patient Location: PACU  Anesthesia Type:General  Level of Consciousness:  sedated, patient cooperative and responds to stimulation  Airway & Oxygen Therapy:Patient Spontanous Breathing and Patient connected to face mask oxgen  Post-op Assessment:  Report given to PACU RN and Post -op Vital signs reviewed and stable  Post vital signs:  Reviewed and stable  Last Vitals:  Filed Vitals:   08/31/15 0528 08/31/15 0819  BP: 107/60 114/68  Pulse: 54 51  Temp: 36.5 C 36.6 C  Resp: 18 18    Complications: No apparent anesthesia complications

## 2015-08-31 NOTE — Op Note (Signed)
Preoperative Diagnosis: Cholelithiasis and chronic cholecystitis   Postoprative Diagnosis: Same  Procedure: Procedure(s): LAPAROSCOPIC CHOLECYSTECTOMY WITH INTRAOPERATIVE CHOLANGIOGRAM   Surgeon: Excell Seltzer T   Assistants: Alphonsa Overall  Anesthesia:  General endotracheal anesthesia  Indications: Patient is a 50 year old male who presents with repeated and now persistent right upper quadrant abdominal pain with nausea and vomiting. Workup has included a negative CT scan a gallbladder ultrasound showing sludge and gallstones. He continues to have localized right upper quadrant tenderness. We have recommended proceeding with laparoscopic cholecystectomy and cholangiogram for apparent persistent biliary colic and chronic cholecystitis. The procedure and indications and risks have been discussed extensively and detailed elsewhere.    Procedure Detail:  Patient was brought to the operating room, placed in the supine position on the operating table, and general endotracheal anesthesia induced. He received preoperative IV antibiotics. PAS were in place. The abdomen was widely sterilely prepped and draped. Patient timeout was performed and correct procedure verified. He had had a previous laparoscopic sigmoid colectomy. A previous trocar site just above the umbilicus was used and dissection carried down to midline fascia which was sharply incised for 1 cm and the peritoneum entered under direct vision. A mattress suture of 0 Vicryl the Hassan trocar was placed and pneumoperitoneum established. Under direct vision an 11 mm trocar was placed subxiphoid and 25 mm trochars along the right subcostal margin. The gallbladder was distended but not acutely inflamed. The fundus was grasped and elevated and the infundibulum retracted laterally. Peritoneum anterior and posterior to close triangle was incised and the distal gallbladder dissected with careful blunt dissection. The cystic artery was clearly  identified coursing up onto the gallbladder wall and was isolated on the gallbladder wall and divided between 2 proximal and 1 distal clip. Closed right was further dissected and the cystic duct gallbladder junction dissected and the cystic duct clearly identified with a good view. The cystic duct was clipped at the gallbladder junction and an operative cholangiogram obtained through the cystic duct which was quite small showing a small but normal common bile duct and intrahepatic ducts with free flow into the duodenum and no filling defects. Following this the wedge cath was removed and the cystic duct was triply clipped proximally and divided. The gallbladder was then dissected free from its bed using hook cautery and placed in Endo Catch bag. Hemostasis was obtained of the gallbladder bed with cautery and Evista. The gallbladder was removed through the umbilical port after decompression externally. The operative site was inspected for hemostasis which appeared complete and there was no evidence of trocar injury or other problems. All CO2 was evacuated and the mattress suture secured at the [place]. Skin incisions were closed with subcuticular Monocryl and Dermabond. Sponge needle and instrument counts were correct.    Findings: As above  Estimated Blood Loss:  Minimal         Drains: none  Blood Given: none          Specimens: Gallbladder and contents        Complications:  * No complications entered in OR log *         Disposition: PACU - hemodynamically stable.         Condition: stable

## 2015-08-31 NOTE — Progress Notes (Signed)
Patient ID: Andrew Love, male   DOB: 1966/04/22, 50 y.o.   MRN: [id number]     Andrew Love., Andrew Love, Andrew Love [address]    Phone: [phone] FAX: [phone]     Subjective: +nausea, pain.  Labs are normal.  Npo.  ua normal.  a1c 5.3  Objective:  Vital signs:  Filed Vitals:   08/30/15 1436 08/30/15 2140 08/31/15 0145 08/31/15 0528  BP: 100/61 104/59 101/53 107/60  Pulse: 53 53 62 54  Temp: 97.5 F (36.4 C) 97.8 F (36.6 C) 98 F (36.7 C) 97.7 F (36.5 C)  TempSrc: Oral Oral Oral Oral  Resp: 20 18 18 18   Height:      Weight:      SpO2: 97% 97% 95% 95%    Last BM Date: 08/31/15  Intake/Output   Yesterday:  04/24 0701 - 04/25 0700 In: 1900 [I.V.:1900] Out: -  This shift:    I/O last 3 completed shifts: In: 1900 [I.V.:1900] Out: -     Physical Exam: General: Pt awake/alert/oriented x4 in no acute distress Chest: cta.  No chest wall pain w good excursion CV:  Pulses intact.  Regular rhythm MS: Normal AROM mjr joints.  No obvious deformity Abdomen: Soft.  Nondistended. ttp ruq.  No evidence of peritonitis.  No incarcerated hernias. Ext:  SCDs BLE.  No mjr edema.  No cyanosis Skin: No petechiae / purpura   Problem List:   Active Problems:   Symptomatic cholelithiasis   Preoperative cardiovascular examination   Coronary artery disease involving native coronary artery of native heart without angina pectoris    Results:   Labs: Results for orders placed or performed during the hospital encounter of 08/30/15 (from the past 48 hour(s))  Lipase, blood     Status: None   Collection Time: 08/30/15  8:57 AM  Result Value Ref Range   Lipase 20 11 - 51 U/L  CBC with Differential     Status: None   Collection Time: 08/30/15  8:57 AM  Result Value Ref Range   WBC 9.7 4.0 - 10.5 K/uL   RBC 5.03 4.22 - 5.81 MIL/uL   Hemoglobin 16.2 13.0 - 17.0 g/dL   HCT 46.0 39.0 - 52.0 %   MCV 91.5 78.0 -  100.0 fL   MCH 32.2 26.0 - 34.0 pg   MCHC 35.2 30.0 - 36.0 g/dL   RDW 12.7 11.5 - 15.5 %   Platelets 252 150 - 400 K/uL   Neutrophils Relative % 56 %   Neutro Abs 5.3 1.7 - 7.7 K/uL   Lymphocytes Relative 31 %   Lymphs Abs 3.0 0.7 - 4.0 K/uL   Monocytes Relative 10 %   Monocytes Absolute 1.0 0.1 - 1.0 K/uL   Eosinophils Relative 3 %   Eosinophils Absolute 0.3 0.0 - 0.7 K/uL   Basophils Relative 0 %   Basophils Absolute 0.0 0.0 - 0.1 K/uL  Comprehensive metabolic panel     Status: Abnormal   Collection Time: 08/30/15  8:57 AM  Result Value Ref Range   Sodium 136 135 - 145 mmol/L   Potassium 3.5 3.5 - 5.1 mmol/L   Chloride 104 101 - 111 mmol/L   CO2 21 (L) 22 - 32 mmol/L   Glucose, Bld 110 (H) 65 - 99 mg/dL   BUN 15 6 - 20 mg/dL   Creatinine, Ser 1.02 0.61 - 1.24 mg/dL   Calcium 9.0 8.9 -  10.3 mg/dL   Total Protein 7.3 6.5 - 8.1 g/dL   Albumin 4.4 3.5 - 5.0 g/dL   AST 38 15 - 41 U/L   ALT 70 (H) 17 - 63 U/L   Alkaline Phosphatase 74 38 - 126 U/L   Total Bilirubin 0.3 0.3 - 1.2 mg/dL   GFR calc non Af Amer >60 >60 mL/min   GFR calc Af Amer >60 >60 mL/min    Comment: (NOTE) The eGFR has been calculated using the CKD EPI equation. This calculation has not been validated in all clinical situations. eGFR's persistently <60 mL/min signify possible Chronic Kidney Disease.    Anion gap 11 5 - 15  I-Stat Troponin, ED (not at [facility])     Status: None   Collection Time: 08/30/15  9:05 AM  Result Value Ref Range   Troponin i, poc 0.00 0.00 - 0.08 ng/mL   Comment 3            Comment: Due to the release kinetics of cTnI, a negative result within the first hours of the onset of symptoms does not rule out myocardial infarction with certainty. If myocardial infarction is still suspected, repeat the test at appropriate intervals.   Hemoglobin A1c     Status: None   Collection Time: 08/30/15 11:27 AM  Result Value Ref Range   Hgb A1c MFr Bld 5.2 4.8 - 5.6 %    Comment: (NOTE)          Pre-diabetes: 5.7 - 6.4         Diabetes: >6.4         Glycemic control for adults with diabetes: <7.0    Mean Plasma Glucose 103 mg/dL    Comment: (NOTE) Performed At: [facility] [place], [place] [id number] Lindon Romp MD DJ:[id number]   Urinalysis, Routine w reflex microscopic (not at [facility])     Status: None   Collection Time: 08/30/15 12:37 PM  Result Value Ref Range   Color, Urine YELLOW YELLOW   APPearance CLEAR CLEAR   Specific Gravity, Urine 1.015 1.005 - 1.030   pH 6.0 5.0 - 8.0   Glucose, UA NEGATIVE NEGATIVE mg/dL   Hgb urine dipstick NEGATIVE NEGATIVE   Bilirubin Urine NEGATIVE NEGATIVE   Ketones, ur NEGATIVE NEGATIVE mg/dL   Protein, ur NEGATIVE NEGATIVE mg/dL   Nitrite NEGATIVE NEGATIVE   Leukocytes, UA NEGATIVE NEGATIVE    Comment: MICROSCOPIC NOT DONE ON URINES WITH NEGATIVE PROTEIN, BLOOD, LEUKOCYTES, NITRITE, OR GLUCOSE <1000 mg/dL.  Surgical pcr screen     Status: None   Collection Time: 08/30/15  7:12 PM  Result Value Ref Range   MRSA, PCR NEGATIVE NEGATIVE   Staphylococcus aureus NEGATIVE NEGATIVE    Comment:        The Xpert SA Assay (FDA approved for NASAL specimens in patients over 75 years of age), is one component of a comprehensive surveillance program.  Test performance has been validated by [facility] for patients greater than or equal to 4 year old. It is not intended to diagnose infection nor to guide or monitor treatment.   Comprehensive metabolic panel     Status: Abnormal   Collection Time: 08/31/15  4:36 AM  Result Value Ref Range   Sodium 141 135 - 145 mmol/L   Potassium 3.9 3.5 - 5.1 mmol/L   Chloride 108 101 - 111 mmol/L   CO2 25 22 - 32 mmol/L   Glucose, Bld 98 65 - 99 mg/dL  BUN 13 6 - 20 mg/dL   Creatinine, Ser 1.02 0.61 - 1.24 mg/dL   Calcium 8.6 (L) 8.9 - 10.3 mg/dL   Total Protein 6.0 (L) 6.5 - 8.1 g/dL   Albumin 3.7 3.5 - 5.0 g/dL   AST 36 15 - 41 U/L   ALT 63 17 - 63 U/L   Alkaline  Phosphatase 62 38 - 126 U/L   Total Bilirubin 1.2 0.3 - 1.2 mg/dL   GFR calc non Af Amer >60 >60 mL/min   GFR calc Af Amer >60 >60 mL/min    Comment: (NOTE) The eGFR has been calculated using the CKD EPI equation. This calculation has not been validated in all clinical situations. eGFR's persistently <60 mL/min signify possible Chronic Kidney Disease.    Anion gap 8 5 - 15  CBC     Status: None   Collection Time: 08/31/15  4:36 AM  Result Value Ref Range   WBC 6.9 4.0 - 10.5 K/uL   RBC 4.44 4.22 - 5.81 MIL/uL   Hemoglobin 14.0 13.0 - 17.0 g/dL   HCT 40.3 39.0 - 52.0 %   MCV 90.8 78.0 - 100.0 fL   MCH 31.5 26.0 - 34.0 pg   MCHC 34.7 30.0 - 36.0 g/dL   RDW 12.5 11.5 - 15.5 %   Platelets 194 150 - 400 K/uL    Imaging / Studies: No results found.  Medications / Allergies:  Scheduled Meds: . cefTRIAXone (ROCEPHIN)  IV  2 g Intravenous Q24H  . enoxaparin (LOVENOX) injection  40 mg Subcutaneous Q24H  . escitalopram  10 mg Oral Daily  . irbesartan  150 mg Oral Daily  . levothyroxine  300 mcg Oral QAC breakfast  . pantoprazole  40 mg Oral Daily  . zolpidem  10 mg Oral QHS   Continuous Infusions: . dextrose 5 % and 0.9% NaCl 75 mL/hr at 08/30/15 2317   PRN Meds:.acetaminophen **OR** acetaminophen, HYDROmorphone (DILAUDID) injection, ondansetron **OR** ondansetron (ZOFRAN) IV  Antibiotics: Anti-infectives    Start     Dose/Rate Route Frequency Ordered Stop   08/30/15 1100  cefTRIAXone (ROCEPHIN) 2 g in dextrose 5 % 50 mL IVPB     2 g 100 mL/hr over 30 Minutes Intravenous Every 24 hours 08/30/15 1056          Assessment/Plan Symptomatic cholelithiasis-possible early cholecystitis.cleared for surgery by cardiology.  Proceed with laparoscopic cholecystectomy.  Surgical risks discussed including infection, bleeding, injury to surrounding structures, open surgery, need for further surgeries, heart attack, dvt/pe, respiratory compromise.  He verbalizes understanding and wishes  to proceed.  OSA-non compliant with CPAP. Discussed importance. Dyspnea, Hx CAD, HTN-OP cards follow up Hypothyroidism-TSH elevated 4/17. discussed reducing dose to 262mg.  The patient prefers to wait and see Dr. BDulcy Fannyfor further adjustment.  HTN-home meds  VTE prophylaxis-SCD/lovenox Dispo-to OR    EErby Pian ANP-BC [facility] Pager [phone](7A-4:30P)   08/31/2015 8:13 AM

## 2015-08-31 NOTE — Anesthesia Preprocedure Evaluation (Addendum)
Anesthesia Evaluation  Patient identified by MRN, date of birth, ID band Patient awake    Reviewed: Allergy & Precautions, NPO status , Patient's Chart, lab work & pertinent test results  Airway Mallampati: II  TM Distance: >3 FB Neck ROM: Full    Dental no notable dental hx.    Pulmonary sleep apnea , Current Smoker,    Pulmonary exam normal breath sounds clear to auscultation       Cardiovascular hypertension, Pt. on medications + CAD and + Peripheral Vascular Disease  Normal cardiovascular exam Rhythm:Regular Rate:Normal  ECHO 01-07-15: EF > 55%   Neuro/Psych TIACVA negative psych ROS   GI/Hepatic Neg liver ROS, PUD,   Endo/Other  Hypothyroidism   Renal/GU negative Renal ROS  negative genitourinary   Musculoskeletal  (+) Arthritis ,   Abdominal (+) + obese,   Peds negative pediatric ROS (+)  Hematology negative hematology ROS (+)   Anesthesia Other Findings   Reproductive/Obstetrics negative OB ROS                            Anesthesia Physical Anesthesia Plan  ASA: III  Anesthesia Plan: General   Post-op Pain Management:    Induction: Intravenous  Airway Management Planned: Oral ETT  Additional Equipment:   Intra-op Plan:   Post-operative Plan: Extubation in OR  Informed Consent: I have reviewed the patients History and Physical, chart, labs and discussed the procedure including the risks, benefits and alternatives for the proposed anesthesia with the patient or authorized representative who has indicated his/her understanding and acceptance.   Dental advisory given  Plan Discussed with: CRNA  Anesthesia Plan Comments:         Anesthesia Quick Evaluation

## 2015-08-31 NOTE — Anesthesia Postprocedure Evaluation (Signed)
Anesthesia Post Note  Patient: Andrew Love  Procedure(s) Performed: Procedure(s) (LRB): LAPAROSCOPIC CHOLECYSTECTOMY WITH INTRAOPERATIVE CHOLANGIOGRAM (N/A)  Patient location during evaluation: PACU Anesthesia Type: General Level of consciousness: awake and alert Pain management: pain level controlled Vital Signs Assessment: post-procedure vital signs reviewed and stable Respiratory status: spontaneous breathing, nonlabored ventilation, respiratory function stable and patient connected to nasal cannula oxygen Cardiovascular status: blood pressure returned to baseline and stable Postop Assessment: no signs of nausea or vomiting Anesthetic complications: no    Last Vitals:  Filed Vitals:   08/31/15 1145 08/31/15 1158  BP:  130/63  Pulse: 61   Temp:  36.7 C  Resp: 12 12    Last Pain:  Filed Vitals:   08/31/15 1159  PainSc: 3                  Havannah Streat J

## 2015-08-31 NOTE — Anesthesia Procedure Notes (Signed)
Procedure Name: Intubation Date/Time: 08/31/2015 9:22 AM Performed by: Noralyn Pick D Pre-anesthesia Checklist: Patient identified, Emergency Drugs available, Suction available and Patient being monitored Patient Re-evaluated:Patient Re-evaluated prior to inductionOxygen Delivery Method: Circle System Utilized Preoxygenation: Pre-oxygenation with 100% oxygen Intubation Type: IV induction Ventilation: Mask ventilation without difficulty Laryngoscope Size: Mac and 4 Grade View: Grade II Tube type: Oral Tube size: 7.5 mm Number of attempts: 1 Airway Equipment and Method: Stylet and Oral airway Placement Confirmation: ETT inserted through vocal cords under direct vision,  positive ETCO2 and breath sounds checked- equal and bilateral Secured at: 22 cm Tube secured with: Tape Dental Injury: Teeth and Oropharynx as per pre-operative assessment

## 2015-09-01 ENCOUNTER — Encounter (HOSPITAL_COMMUNITY): Payer: Self-pay | Admitting: General Surgery

## 2015-09-01 LAB — URINE CULTURE: Culture: NO GROWTH

## 2015-09-01 MED ORDER — OXYCODONE-ACETAMINOPHEN 10-325 MG PO TABS: 0.5000 | ORAL_TABLET | Freq: Every day | ORAL | Status: DC | PRN

## 2015-09-01 NOTE — Discharge Summary (Signed)
Physician Discharge Summary  Patient ID: [name] MRN: [id number] DOB/AGE: 49-May-1967 49 y.o.  Admit date: 08/30/2015 Discharge date: 09/01/2015  Admitting Diagnosis: Symptomatic cholelithiasis    Discharge Diagnosis Patient Active Problem List   Diagnosis Date Noted  . Symptomatic cholelithiasis 08/30/2015  . Preoperative cardiovascular examination   . Coronary artery disease involving native coronary artery of native heart without angina pectoris   . Gastroenteritis 08/26/2015  . Cholelithiases 08/24/2015  . Abdominal pain 08/24/2015  . Cholecystitis, acute 08/24/2015  . Diarrhea 08/24/2015  . Acute cholecystitis 08/24/2015  . Nausea with vomiting 04/08/2015  . Diverticulitis of colon   . Cerebral thrombosis with cerebral infarction ([place]) 03/21/2014  . Stroke ([place]) 03/21/2014  . Essential hypertension 03/21/2014  . TIA (transient ischemic attack)   . HLD (hyperlipidemia)   . Thyroid activity decreased   . Colitis 11/20/2012  . CAD (coronary artery disease)   . Hip pain 08/04/2011  . 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 LIVER FUNCTION TESTS, ABNORMAL, HX OF 12/17/2008    Consultants cardiology  Imaging: Dg Cholangiogram Operative  08/31/2015  CLINICAL DATA:  ruq pain.2 min fluoro EXAM: INTRAOPERATIVE CHOLANGIOGRAM TECHNIQUE: Cholangiographic images from the C-arm fluoroscopic device were submitted for interpretation post-operatively. Please see the procedural report for the amount of contrast and the fluoroscopy time utilized. COMPARISON:  Ultrasound 08/24/2015 FINDINGS: No persistent filling defects in the common duct. Intrahepatic ducts are incompletely visualized, appearing decompressed centrally. Contrast passes into the duodenum. : Negative for retained common duct stone. Electronically Signed   By: Andrew Love M.D.   On: 08/31/2015 10:26    Procedures Laparoscopic cholecystectomy with IOC--Dr. Excell Seltzer  HPI: Andrew Love is a  50 year old male with a history of OSA, HTN, diverticulitis s/p lap assisted sigmoid colectomy in 2012, nicotine dependence, hypothyroidism who presents with recurrent abdominal pain. He was hospitalized at [facility] 4/18-4/20 with gastroenteritis which started last Monday night. Symptoms included, nausea, vomiting, diarrhea. Wife had like symptoms a few days later. The patient was subsequently discharged home, but had persistent nausea. Last night, he developed RUQ abdominal pain which was severe and prompted him to return to the ED. This radiates to the epigastrium and left chest. Diarrhea has resolved. Denies fever, chills or sweats. Work up shows, normal WBC, normal LFTs. During his last hospitalization, CT of a/p was essentially normal. Abdominal US revealed gallbladder sludge and stones. We have therefore been asked to evaluate.   Hospital Course:  Patient was admitted and a cardiology consultation was recommended for clearance.  They recommended proceeding with surgery without further work up.  He underwent the procedure listed above which he tolerated well and was transferred to the floor. Maintained on SCDs and lovenox.  On POD#1, the patient was voiding well, tolerating diet, ambulating well, pain well controlled, vital signs stable, incisions c/d/i and felt stable for discharge home.  Medication risks, benefits and therapeutic alternatives were reviewed with the patient.  He verbalizes understanding.  Patient will follow up in our office in 3 weeks and knows to call with questions or concerns.  Hypertension-remained stable and home medication were continued Hypothyroidism-upon review of his hospitalization last week.  TSH was .067.  I recommended a decrease in dose to 267mcg, however, the patient opted to wait until he sees his PCP OSA-discussed compliance and following up with PCP.  I advised him that this may be a contributing factor to fatigue and dyspnea.   Physical Exam: General:   Alert, NAD, pleasant,  comfortable Abd:  Soft, ND, mild tenderness, incisions C/D/I    Medication List    TAKE these medications        acetaminophen 500 MG tablet  Commonly known as:  TYLENOL  Take 1,000 mg by mouth every 6 (six) hours as needed for mild pain, moderate pain, fever or headache.     AMBIEN 10 MG tablet  Generic drug:  zolpidem  Take 10 mg by mouth at bedtime.     cyclobenzaprine 10 MG tablet  Commonly known as:  FLEXERIL  Take 1 tablet (10 mg total) by mouth 3 (three) times daily as needed.     diphenhydrAMINE 25 MG tablet  Commonly known as:  BENADRYL  Take 50 mg by mouth every 6 (six) hours as needed for allergies.     EPINEPHrine 0.3 mg/0.3 mL Soaj injection  Commonly known as:  EPIPEN  Inject 0.3 mLs (0.3 mg total) into the muscle as needed.     escitalopram 10 MG tablet  Commonly known as:  LEXAPRO  Take 10 mg by mouth daily.     HYDROmorphone 4 MG tablet  Commonly known as:  DILAUDID  Take 4 mg by mouth every 4 (four) hours as needed for moderate pain or severe pain.     levothyroxine 300 MCG tablet  Commonly known as:  SYNTHROID, LEVOTHROID  Take 300 mcg by mouth daily.     meloxicam 7.5 MG tablet  Commonly known as:  MOBIC  Take 7.5 mg by mouth 2 (two) times daily.     olmesartan 20 MG tablet  Commonly known as:  BENICAR  Take 20 mg by mouth daily.     ondansetron 8 MG disintegrating tablet  Commonly known as:  ZOFRAN ODT  Take 1 tablet (8 mg total) by mouth every 8 (eight) hours as needed for nausea or vomiting.     oxyCODONE-acetaminophen 10-325 MG tablet  Commonly known as:  PERCOCET  Take 0.5-1 tablets by mouth daily as needed for pain.     oxymetazoline 0.05 % nasal spray  Commonly known as:  AFRIN  Place 1 spray into both nostrils 2 (two) times daily as needed for congestion.     pantoprazole 40 MG tablet  Commonly known as:  PROTONIX  Take 1 tablet (40 mg total) by mouth 2 (two) times daily before a meal. Take 1 tablet by  mouth 2 time daily before a meal for 30 days then take 1 tab daily before a meal     polyethylene glycol packet  Commonly known as:  MIRALAX / GLYCOLAX  Take 17 g by mouth daily as needed for mild constipation.             Follow-up Information    Follow up with Rozann Lesches, MD On 09/15/2015.   Specialty:  Cardiology   Why:  4:20pm   Contact information:   [place] [address] [phone]       Follow up with [facility] On 09/22/2015.   Specialty:  General Surgery   Why:  arrive by 10:30AM for your post op check   Contact information:   [address] [place] [address] [phone]       Signed: Erby Pian, [facility] [phone]  09/01/2015, 10:04 AM

## 2015-09-01 NOTE — Discharge Instructions (Signed)

## 2015-09-01 NOTE — Progress Notes (Signed)
    Outpatient cardiology follow up arranged with Dr. Domenic Polite in [place] arranged.  Angelena Form PA-C  [facility]

## 2015-09-15 ENCOUNTER — Encounter: Payer: Commercial Managed Care - HMO | Admitting: Cardiology

## 2015-10-19 ENCOUNTER — Other Ambulatory Visit: Payer: Self-pay

## 2015-10-19 MED ORDER — PANTOPRAZOLE SODIUM 40 MG PO TBEC: 40.0000 mg | DELAYED_RELEASE_TABLET | Freq: Every day | ORAL | Status: DC

## 2016-01-06 ENCOUNTER — Encounter (HOSPITAL_COMMUNITY): Payer: Self-pay | Admitting: Physical Therapy

## 2016-01-06 NOTE — Therapy (Signed)
[place], [place], [address] Phone: [phone]   Fax:  [phone]  Patient Details  Name: Andrew Love MRN: [id number] Date of Birth: 1965/09/03 Referring Provider:  No ref. provider found  Encounter Date: 01/06/2016   PHYSICAL THERAPY DISCHARGE SUMMARY  Visits from Start of Care: 5  Current functional level related to goals / functional outcomes: Patient has not returned since last skilled session    Remaining deficits: Unable to assess    Education / Equipment: N/A  Plan: Patient agrees to discharge.  Patient goals were not met. Patient is being discharged due to not returning since the last visit.  ?????    Deniece Ree PT, DPT [facility] [address] [place], [place], [address] Phone: [phone]   Fax:  [phone]

## 2016-01-21 ENCOUNTER — Emergency Department (HOSPITAL_COMMUNITY): Payer: Worker's Compensation

## 2016-01-21 ENCOUNTER — Encounter (HOSPITAL_COMMUNITY): Payer: Self-pay | Admitting: *Deleted

## 2016-01-21 ENCOUNTER — Emergency Department (HOSPITAL_COMMUNITY)
Admission: EM | Admit: 2016-01-21 | Discharge: 2016-01-21 | Disposition: A | Discharge status: Home / Self Care | Payer: Worker's Compensation | Attending: Emergency Medicine | Admitting: Emergency Medicine

## 2016-01-21 DIAGNOSIS — I10 Essential (primary) hypertension: Secondary | ICD-10-CM | POA: Diagnosis not present

## 2016-01-21 DIAGNOSIS — E039 Hypothyroidism, unspecified: Secondary | ICD-10-CM | POA: Insufficient documentation

## 2016-01-21 DIAGNOSIS — Z79899 Other long term (current) drug therapy: Secondary | ICD-10-CM | POA: Insufficient documentation

## 2016-01-21 DIAGNOSIS — M545 Low back pain, unspecified: Secondary | ICD-10-CM

## 2016-01-21 DIAGNOSIS — I2510 Atherosclerotic heart disease of native coronary artery without angina pectoris: Secondary | ICD-10-CM | POA: Diagnosis not present

## 2016-01-21 DIAGNOSIS — F17210 Nicotine dependence, cigarettes, uncomplicated: Secondary | ICD-10-CM | POA: Diagnosis not present

## 2016-01-21 DIAGNOSIS — M5416 Radiculopathy, lumbar region: Secondary | ICD-10-CM

## 2016-01-21 DIAGNOSIS — M5442 Lumbago with sciatica, left side: Secondary | ICD-10-CM | POA: Diagnosis not present

## 2016-01-21 HISTORY — DX: Pain in left hip: M25.552

## 2016-01-21 HISTORY — DX: Other chronic pain: G89.29

## 2016-01-21 HISTORY — DX: Pain in left leg: M79.605

## 2016-01-21 HISTORY — DX: Dorsalgia, unspecified: M54.9

## 2016-01-21 MED ORDER — HYDROMORPHONE HCL 1 MG/ML IJ SOLN
1.0000 mg | Freq: Once | INTRAMUSCULAR | Status: AC
  Administered 2016-01-21: 1 mg via INTRAVENOUS
  Filled 2016-01-21: qty 1

## 2016-01-21 MED ORDER — OXYCODONE-ACETAMINOPHEN 5-325 MG PO TABS: 1.0000 | ORAL_TABLET | ORAL | 0 refills | Status: DC | PRN

## 2016-01-21 MED ORDER — METHOCARBAMOL 500 MG PO TABS
1000.0000 mg | ORAL_TABLET | Freq: Once | ORAL | Status: AC
  Administered 2016-01-21: 1000 mg via ORAL
  Filled 2016-01-21: qty 2

## 2016-01-21 MED ORDER — IBUPROFEN 600 MG PO TABS: 600.0000 mg | ORAL_TABLET | Freq: Three times a day (TID) | ORAL | 0 refills | Status: DC | PRN

## 2016-01-21 MED ORDER — DEXAMETHASONE SODIUM PHOSPHATE 4 MG/ML IJ SOLN
8.0000 mg | Freq: Once | INTRAMUSCULAR | Status: AC
  Administered 2016-01-21: 8 mg via INTRAVENOUS
  Filled 2016-01-21: qty 2

## 2016-01-21 MED ORDER — KETOROLAC TROMETHAMINE 30 MG/ML IJ SOLN
30.0000 mg | Freq: Once | INTRAMUSCULAR | Status: AC
  Administered 2016-01-21: 30 mg via INTRAVENOUS
  Filled 2016-01-21: qty 1

## 2016-01-21 MED ORDER — ACETAMINOPHEN 325 MG PO TABS
650.0000 mg | ORAL_TABLET | Freq: Once | ORAL | Status: AC
  Administered 2016-01-21: 650 mg via ORAL
  Filled 2016-01-21: qty 2

## 2016-01-21 MED ORDER — METHOCARBAMOL 500 MG PO TABS: 500.0000 mg | ORAL_TABLET | Freq: Three times a day (TID) | ORAL | 0 refills | Status: DC | PRN

## 2016-01-21 MED ORDER — OXYCODONE-ACETAMINOPHEN 5-325 MG PO TABS
1.0000 | ORAL_TABLET | Freq: Once | ORAL | Status: AC
  Administered 2016-01-21: 1 via ORAL
  Filled 2016-01-21: qty 1

## 2016-01-21 NOTE — ED Provider Notes (Signed)
[facility] DEPT Provider Note   CSN: [id number] Arrival date & time: 01/21/16  1048     History   Chief Complaint Chief Complaint  Patient presents with  . Back Pain    HPI Andrew Love is a 50 y.o. male.  Patient presents to the emergency department with complaints of increasing low back pain over the past 48 hours.  Has long-standing history of recurrent back pain.  He follows with Dr. Saintclair Halsted of neurosurgery.  His last lumbar procedure was approximately a year ago.  He states he did heavy lifting several days ago it exacerbates his back.  He's tried anti-inflammatories and muscle relaxants without improvement in his symptoms.  He is not a chronic opioid medication.  He was also started on a course of steroids and does not feel like is having improvement.  There is question as to whether he denies been having some issues urinating.  He does not mention this specifically to me.   The history is provided by the patient.  Back Pain      Past Medical History:  Diagnosis Date  . Arthritis   . Back pain    chronic  . Bronchitis    history of  . CAD (coronary artery disease)   . Chronic back pain   . Chronic insomnia    Per medical history form dated 06/13/10.  . Chronic left hip pain   . Colitis    Per medical history from dated 06/13/10.  . Diverticulitis    Hx of; requiring 3 admissions  . Elevated liver enzymes   . Hypertension   . Hypothyroidism   . Left leg pain    chronic  . 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 Psoriasis    Per medical history form dated 06/13/10.  . Sigmoid colon ulcer    Rectal polyps  . Sleep apnea with use of continuous positive airway pressure (CPAP)     Patient Active Problem List   Diagnosis Date Noted  . Symptomatic cholelithiasis 08/30/2015  . Preoperative cardiovascular examination   . Coronary artery disease involving native coronary artery of native heart without angina pectoris   . Gastroenteritis  08/26/2015  . Cholelithiases 08/24/2015  . Abdominal pain 08/24/2015  . Cholecystitis, acute 08/24/2015  . Diarrhea 08/24/2015  . Acute cholecystitis 08/24/2015  . Nausea with vomiting 04/08/2015  . Diverticulitis of colon   . Cerebral thrombosis with cerebral infarction ([place]) 03/21/2014  . Stroke ([place]) 03/21/2014  . Essential hypertension 03/21/2014  . TIA (transient ischemic attack)   . HLD (hyperlipidemia)   . Thyroid activity decreased   . Colitis 11/20/2012  . CAD (coronary artery disease)   . Hip pain 08/04/2011  . 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 LIVER FUNCTION TESTS, ABNORMAL, HX OF 12/17/2008    Past Surgical History:  Procedure Laterality Date  . APPENDECTOMY     8th grade  . CARDIAC CATHETERIZATION  2009  . CARDIAC CATHETERIZATION     Per medical history from dated 06/13/10.  . CERVICAL SPINE SURGERY     C4, C5, C6 spinal fusion  . CHOLECYSTECTOMY N/A 08/31/2015   Procedure: LAPAROSCOPIC CHOLECYSTECTOMY WITH INTRAOPERATIVE CHOLANGIOGRAM;  Surgeon: Excell Seltzer, MD;  Location: [facility];  Service: General;  Laterality: N/A;  . COLONOSCOPY  07/2008   Colitis,NSAID v. Ischemia,malrotation of the gut,Diverticulosis(L),hyperplastic  . COLONOSCOPY N/A 12/13/2012   Dr. Oneida Alar: Normal TI, mild sigmoid diverticulosis, hemorrhoids, 2 polyps (tubular adenoma). Random colon bx negative. Next  TCS 12/2022 with Fentanyl/phenergan  . ESOPHAGOGASTRODUODENOSCOPY N/A 04/16/2014   RMR: Erosive reflux esophagitis. Non critical Schzki's ring not manipulated. Small hiatal hernia. Abnormal gastirc mucosa of doubtful signigicance status post biopsy. I suspect trivial upper GI bleed. Recent abdominal pain presumably secondary to a protracted bout of diverticulitis. CT scan November 23 revealed improvemetn without complication. He finished his antibiotics 2 days age. Left sided ab  . JOINT REPLACEMENT    . LAPAROSCOPIC SIGMOID COLECTOMY  2012   Dr. Fanny Skates:  recurrent sigmoid diverticulitis  . LAPAROSCOPIC SIGMOID COLECTOMY  2012   recurernt sigmoid diverticulitis, Dr. Dalbert Batman   . ROTATOR CUFF REPAIR     Left - per medical history form dated 06/13/10.  Marland Kitchen SHOULDER SURGERY     Left  . THYROIDECTOMY     Per medical history form dated 06/13/10.  Marland Kitchen TOTAL HIP ARTHROPLASTY  07/04/2011   Procedure: TOTAL HIP ARTHROPLASTY;  Surgeon: Johnny Bridge, MD;  Location: [facility];  Service: Orthopedics;  Laterality: Right;       Home Medications    Prior to Admission medications   Medication Sig Start Date End Date Taking? Authorizing Provider  acetaminophen (TYLENOL) 500 MG tablet Take 1,000 mg by mouth every 6 (six) hours as needed for mild pain, moderate pain, fever or headache.    Historical Provider, MD  cyclobenzaprine (FLEXERIL) 10 MG tablet Take 1 tablet (10 mg total) by mouth 3 (three) times daily as needed. Patient taking differently: Take 10 mg by mouth 3 (three) times daily as needed for muscle spasms.  04/27/15   Rolland Porter, MD  diphenhydrAMINE (BENADRYL) 25 MG tablet Take 50 mg by mouth every 6 (six) hours as needed for allergies.     Historical Provider, MD  EPINEPHrine (EPIPEN) 0.3 mg/0.3 mL SOAJ Inject 0.3 mLs (0.3 mg total) into the muscle as needed. Patient taking differently: Inject 0.3 mg into the muscle daily as needed (allergic reaction).  12/01/12   Teressa Lower, MD  escitalopram (LEXAPRO) 10 MG tablet Take 10 mg by mouth daily. 04/28/15   Historical Provider, MD  HYDROmorphone (DILAUDID) 4 MG tablet Take 4 mg by mouth every 4 (four) hours as needed for moderate pain or severe pain.    Historical Provider, MD  levothyroxine (SYNTHROID, LEVOTHROID) 300 MCG tablet Take 300 mcg by mouth daily. 03/26/14   Historical Provider, MD  meloxicam (MOBIC) 7.5 MG tablet Take 7.5 mg by mouth 2 (two) times daily. 08/10/15   Historical Provider, MD  olmesartan (BENICAR) 20 MG tablet Take 20 mg by mouth daily.     Historical Provider, MD  ondansetron (ZOFRAN  ODT) 8 MG disintegrating tablet Take 1 tablet (8 mg total) by mouth every 8 (eight) hours as needed for nausea or vomiting. 08/25/15   Kathie Dike, MD  oxyCODONE-acetaminophen (PERCOCET) 10-325 MG tablet Take 0.5-1 tablets by mouth daily as needed for pain. 09/01/15   Emina Riebock, NP  oxymetazoline (AFRIN) 0.05 % nasal spray Place 1 spray into both nostrils 2 (two) times daily as needed for congestion.    Historical Provider, MD  pantoprazole (PROTONIX) 40 MG tablet Take 1 tablet (40 mg total) by mouth daily. 10/19/15   Carlis Stable, NP  polyethylene glycol (MIRALAX / GLYCOLAX) packet Take 17 g by mouth daily as needed for mild constipation. 08/25/15   Kathie Dike, MD  zolpidem (AMBIEN) 10 MG tablet Take 10 mg by mouth at bedtime.     Historical Provider, MD    Family History Family History  Problem Relation Age of Onset  . Cancer Mother     breast cancer - per medical history form dated 06/13/10.  . Diverticulitis Mother   . Hypertension Mother   . Cancer Father     skin - per medical history form dated 06/13/10.  Marland Kitchen Hypertension Father   . Anesthesia problems Neg Hx   . Hypotension Neg Hx   . Malignant hyperthermia Neg Hx   . Pseudochol deficiency Neg Hx   . Colon cancer Neg Hx     Social History Social History  Substance Use Topics  . Smoking status: Current Every Day Smoker    Packs/day: 0.50    Years: 18.00    Types: Cigarettes  . Smokeless tobacco: Never Used     Comment:    . Alcohol use 0.0 oz/week     Comment: Occasional     Allergies   Other and Iohexol   Review of Systems Review of Systems  Musculoskeletal: Positive for back pain.  All other systems reviewed and are negative.    Physical Exam Updated Vital Signs BP 157/98 (BP Location: Left Arm)   Pulse 69   Temp 98.4 F (36.9 C) (Oral)   Resp 16   Ht 6\' 1"  (1.854 m)   Wt 237 lb (107.5 kg)   SpO2 96%   BMI 31.27 kg/m   Physical Exam  Constitutional: He is oriented to person, place, and time. He  appears well-developed and well-nourished.  HENT:  Head: Normocephalic and atraumatic.  Eyes: EOM are normal.  Neck: Normal range of motion.  Cardiovascular: Normal rate, regular rhythm, normal heart sounds and intact distal pulses.   Pulmonary/Chest: Effort normal and breath sounds normal. No respiratory distress.  Abdominal: Soft. He exhibits no distension. There is no tenderness.  Musculoskeletal: Normal range of motion.  Mild paralumbar tenderness on the left with mild spasm.  Mild lumbar tenderness as well without lumbar step-off.  5 out of 5 strength in bilateral lower extremity major muscle groups  Neurological: He is alert and oriented to person, place, and time.  Skin: Skin is warm and dry.  Psychiatric: He has a normal mood and affect. Judgment normal.  Nursing note and vitals reviewed.    ED Treatments / Results  Labs (all labs ordered are listed, but only abnormal results are displayed) Labs Reviewed - No data to display  EKG  EKG Interpretation None       Radiology Mr Lumbar Spine Wo Contrast  Result Date: 01/21/2016 CLINICAL DATA:  Lumbar back pain after injury picking up a chair recently. EXAM: MRI LUMBAR SPINE WITHOUT CONTRAST TECHNIQUE: Multiplanar, multisequence MR imaging of the lumbar spine was performed. No intravenous contrast was administered. COMPARISON:  08/18/2015 lumbar spine MRI FINDINGS: Segmentation:  Standard. Alignment:  Physiologic. Vertebrae:  No fracture, evidence of discitis, or bone lesion. Conus medullaris: Extends to the L2 level and appears normal. Paraspinal and other soft tissues: Postoperative changes in the left of midline back at the L3-4 and L4-5 levels. Disc levels: T12- L1: Unremarkable. L1-L2: Unremarkable. L2-L3: Unremarkable. L3-L4: Minimal residual disc in the inferior left foramen with fat distortion which correlates with enhancing granulation tissue seen previously. There is no mass effect on the L3 nerve. Patent spinal canal.  Negative facets. L4-L5: Mild haziness of fat around the foraminal L4 nerve which correlates with enhancing tissue on previous MRI. No nerve displacement or distortion. Mild inferior left foraminal narrowing from endplate spur. Negative facets. L5-S1:No herniation or impingement. IMPRESSION: No acute finding  or change compared to 08/18/2015. Stable appearance of the postoperative left L3-4 and L4-5 foramina. Electronically Signed   By: Monte Fantasia M.D.   On: 01/21/2016 16:05    Procedures Procedures (including critical care time)  Medications Ordered in ED Medications  ketorolac (TORADOL) 30 MG/ML injection 30 mg (30 mg Intravenous Given 01/21/16 1518)  HYDROmorphone (DILAUDID) injection 1 mg (1 mg Intravenous Given 01/21/16 1523)  dexamethasone (DECADRON) injection 8 mg (8 mg Intravenous Given 01/21/16 1521)  methocarbamol (ROBAXIN) tablet 1,000 mg (1,000 mg Oral Given 01/21/16 1522)  HYDROmorphone (DILAUDID) injection 1 mg (1 mg Intravenous Given 01/21/16 1636)  oxyCODONE-acetaminophen (PERCOCET/ROXICET) 5-325 MG per tablet 1 tablet (1 tablet Oral Given 01/21/16 1635)  acetaminophen (TYLENOL) tablet 650 mg (650 mg Oral Given 01/21/16 1635)     Initial Impression / Assessment and Plan / ED Course  I have reviewed the triage vital signs and the nursing notes.  Pertinent labs & imaging results that were available during my care of the patient were reviewed by me and considered in my medical decision making (see chart for details).  Clinical Course    4:57 PM Patient feels much better in the emergency department after treatment of his pain and muscle spasm.  MRI of his L-spine demonstrates no new acute pathology.  Patient feels much better would like to go home at this time.  Patient be discharged home to follow-up with his primary care physician as well as his neurosurgeon.  He understands to return to the ER for new or worsening symptoms  Final Clinical Impressions(s) / ED Diagnoses    Final diagnoses:  Low back pain  Lumbar radicular pain  Left-sided low back pain with left-sided sciatica    New Prescriptions New Prescriptions   IBUPROFEN (ADVIL,MOTRIN) 600 MG TABLET    Take 1 tablet (600 mg total) by mouth every 8 (eight) hours as needed.   METHOCARBAMOL (ROBAXIN) 500 MG TABLET    Take 1 tablet (500 mg total) by mouth every 8 (eight) hours as needed for muscle spasms.   OXYCODONE-ACETAMINOPHEN (PERCOCET/ROXICET) 5-325 MG TABLET    Take 1 tablet by mouth every 4 (four) hours as needed for severe pain.     Jola Schmidt, MD 01/21/16 [phone]

## 2016-01-21 NOTE — ED Triage Notes (Signed)
Pt has hx of back problems. States he went to pick up a chair on Tuesday and his "back went out." Since then he has had left leg pain that shoots through his left hip. In addition pt states that since this injury he has had left arm numbness and inability to grip things. Pt adds that since back injury he has had difficulty urinating and pain that moves into his groin.

## 2016-06-19 ENCOUNTER — Ambulatory Visit (INDEPENDENT_AMBULATORY_CARE_PROVIDER_SITE_OTHER): Payer: BLUE CROSS/BLUE SHIELD | Admitting: Gastroenterology

## 2016-06-19 ENCOUNTER — Encounter: Payer: Self-pay | Admitting: Gastroenterology

## 2016-06-19 VITALS — BP 142/94 | HR 84 | Temp 97.8°F | Ht 73.0 in | Wt 242.0 lb

## 2016-06-19 DIAGNOSIS — A09 Infectious gastroenteritis and colitis, unspecified: Secondary | ICD-10-CM | POA: Diagnosis not present

## 2016-06-19 DIAGNOSIS — R1012 Left upper quadrant pain: Secondary | ICD-10-CM | POA: Diagnosis not present

## 2016-06-19 DIAGNOSIS — R197 Diarrhea, unspecified: Secondary | ICD-10-CM

## 2016-06-19 MED ORDER — DICYCLOMINE HCL 10 MG PO CAPS: 10.0000 mg | ORAL_CAPSULE | Freq: Three times a day (TID) | ORAL | 3 refills | Status: DC

## 2016-06-19 MED ORDER — ONDANSETRON 8 MG PO TBDP: 8.0000 mg | ORAL_TABLET | Freq: Three times a day (TID) | ORAL | 0 refills | Status: DC | PRN

## 2016-06-19 NOTE — Patient Instructions (Addendum)
1. Please collect stool ASAP and take to [facility]) lab in the [facility].  2. If you have fever, worsening abdominal pain go to the ER.  3. RX for Bentyl and Zofran sent to pharmacy.

## 2016-06-19 NOTE — Progress Notes (Signed)
Primary Care Physician: [name], MD  Primary Gastroenterologist:  Barney Drain, MD   Chief Complaint  Patient presents with  . Abdominal Pain    mid upper abd and left middle, several months & getting worse  . Diarrhea    approx 8x/day  . Nausea    HPI: Andrew Love is a 51 y.o. male hereFor further evaluation of acute on chronic diarrhea, left upper to left middle abdominal pain, nausea. 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  Patient states since December he has had acute on chronic GI symptoms consisting of nausea, left upper quadrant pain which radiates down the left midabdomen associated with significant fecal urgency and diarrhea. No forced to 10 stools daily. Complains of nocturnal diarrhea. At times he scared to eat. Will consume liquid diet only. Really doesn't matter what he eats tends to start the diarrhea. Has been utilizing Zofran for nausea but he is out. Denies melena or rectal bleeding. Heartburn symptoms for the most part well controlled on pantoprazole.  Patient states he was provided amoxicillin back in December for upper respiratory infection. He had additional antibiotics in January for persistent infection, double ear infection. Diarrhea seemed to worsen after the first and second round of antibiotics. No solid stools.  He mentions however that he has chronic diarrhea which worsened after his cholecystectomy but symptoms intensified even more after antibiotic therapy in December.  Takes Imodium on occasion but this only delays his diarrhea and then he pays for the medication wears off.   States his symptoms are similar to when he had diverticulitis. This worries him. Denies fever.    Current Outpatient Prescriptions  Medication Sig  Dispense Refill  . acetaminophen (TYLENOL) 500 MG tablet Take 1,000 mg by mouth every 6 (six) hours as needed for mild pain, moderate pain, fever or headache.    . cyclobenzaprine (FLEXERIL) 10 MG tablet Take 1 tablet (10 mg total) by mouth 3 (three) times daily as needed. (Patient taking differently: Take 10 mg by mouth 3 (three) times daily as needed for muscle spasms. ) 40 tablet 0  . diphenhydrAMINE (BENADRYL) 25 MG tablet Take 50 mg by mouth every 6 (six) hours as needed for allergies.     Marland Kitchen EPINEPHrine (EPIPEN) 0.3 mg/0.3 mL SOAJ Inject 0.3 mLs (0.3 mg total) into the muscle as needed. (Patient taking differently: Inject 0.3 mg into the muscle daily as needed (allergic reaction). ) 1 Device 3  . ibuprofen (ADVIL,MOTRIN) 600 MG tablet Take 1 tablet (600 mg total) by mouth every 8 (eight) hours as needed. 15 tablet 0  . levothyroxine (SYNTHROID, LEVOTHROID) 300 MCG tablet Take 300 mcg by mouth daily.    . methocarbamol (ROBAXIN) 500 MG tablet Take 1 tablet (500 mg total) by mouth every 8 (eight) hours as needed for muscle spasms. 15 tablet 0  . olmesartan (BENICAR) 20 MG tablet Take 20 mg by mouth daily.     . ondansetron (ZOFRAN ODT) 8 MG disintegrating tablet Take 1 tablet (8 mg total) by mouth every 8 (eight) hours as needed for nausea or vomiting. 20 tablet 0  . pantoprazole (PROTONIX) 40 MG tablet Take 1 tablet (40 mg total) by mouth daily. 60 tablet 11  . zolpidem (AMBIEN) 10 MG tablet Take 10 mg by mouth at bedtime.  No current facility-administered medications for this visit.     Allergies as of 06/19/2016 - Review Complete 06/19/2016  Allergen Reaction Noted  . Other Hives and Shortness Of Breath 01/08/2013  . Iohexol Hives 03/31/2014  . Shrimp [shellfish allergy] Hives 06/19/2016  . Wheat bran Hives 06/19/2016   Past Medical History:  Diagnosis Date  . Arthritis   . Back pain    chronic  . Bronchitis    history of  . CAD (coronary artery disease)   . Chronic back pain    . Chronic insomnia    Per medical history form dated 06/13/10.  . Chronic left hip pain   . Colitis    Per medical history from dated 06/13/10.  . Diverticulitis    Hx of; requiring 3 admissions  . Elevated liver enzymes   . Hypertension   . Hypothyroidism   . Left leg pain    chronic  . 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 Psoriasis    Per medical history form dated 06/13/10.  . Sigmoid colon ulcer    Rectal polyps  . Sleep apnea with use of continuous positive airway pressure (CPAP)    Past Surgical History:  Procedure Laterality Date  . APPENDECTOMY     8th grade  . CARDIAC CATHETERIZATION  2009  . CARDIAC CATHETERIZATION     Per medical history from dated 06/13/10.  . CERVICAL SPINE SURGERY     C4, C5, C6 spinal fusion  . CHOLECYSTECTOMY N/A 08/31/2015   Procedure: LAPAROSCOPIC CHOLECYSTECTOMY WITH INTRAOPERATIVE CHOLANGIOGRAM;  Surgeon: Excell Seltzer, MD;  Location: [facility];  Service: General;  Laterality: N/A;  . COLONOSCOPY  07/2008   Colitis,NSAID v. Ischemia,malrotation of the gut,Diverticulosis(L),hyperplastic  . COLONOSCOPY N/A 12/13/2012   Dr. Oneida Alar: Normal TI, mild sigmoid diverticulosis, hemorrhoids, 2 polyps (tubular adenoma). Random colon bx negative. Next TCS 12/2022 with Fentanyl/phenergan  . ESOPHAGOGASTRODUODENOSCOPY N/A 04/16/2014   RMR: Erosive reflux esophagitis. Non critical Schzki's ring not manipulated. Small hiatal hernia. Abnormal gastirc mucosa of doubtful signigicance status post biopsy. I suspect trivial upper GI bleed. Recent abdominal pain presumably secondary to a protracted bout of diverticulitis. CT scan November 23 revealed improvemetn without complication. He finished his antibiotics 2 days age. Left sided ab  . JOINT REPLACEMENT    . LAPAROSCOPIC SIGMOID COLECTOMY  2012   Dr. Fanny Skates: recurrent sigmoid diverticulitis  . LAPAROSCOPIC SIGMOID COLECTOMY  2012   recurernt sigmoid diverticulitis, Dr. Dalbert Batman   .  ROTATOR CUFF REPAIR     Left - per medical history form dated 06/13/10.  Marland Kitchen SHOULDER SURGERY     Left  . THYROIDECTOMY     Per medical history form dated 06/13/10.  Marland Kitchen TOTAL HIP ARTHROPLASTY  07/04/2011   Procedure: TOTAL HIP ARTHROPLASTY;  Surgeon: Johnny Bridge, MD;  Location: [facility];  Service: Orthopedics;  Laterality: Right;    ROS:  General: Negative for anorexia, weight loss, fever, chills, fatigue, weakness. ENT: Negative for hoarseness, difficulty swallowing , nasal congestion. CV: Negative for chest pain, angina, palpitations, dyspnea on exertion, peripheral edema.  Respiratory: Negative for dyspnea at rest, dyspnea on exertion, cough, sputum, wheezing.  GI: See history of present illness. GU:  Negative for dysuria, hematuria, urinary incontinence, urinary frequency, nocturnal urination.  Endo: Negative for unusual weight change.    Physical Examination:   BP (!) 142/94   Pulse 84   Temp 97.8 F (36.6 C) (Oral)   Ht 6\' 1"  (1.854 m)   Wt 242  lb (109.8 kg)   BMI 31.93 kg/m   General: Well-nourished, well-developed in no acute distress.  Eyes: No icterus. Mouth: Oropharyngeal mucosa moist and pink , no lesions erythema or exudate. Lungs: Clear to auscultation bilaterally.  Heart: Regular rate and rhythm, no murmurs rubs or gallops.  Abdomen: Bowel sounds are normal, mild to moderate left mid abdominal tenderness nondistended, no hepatosplenomegaly or masses, no abdominal bruits or hernia , no rebound or guarding.   Extremities: No lower extremity edema. No clubbing or deformities. Neuro: Alert and oriented x 4   Skin: Warm and dry, no jaundice.   Psych: Alert and cooperative, normal mood and affect.

## 2016-06-19 NOTE — Assessment & Plan Note (Signed)
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 Alarm symptoms include nocturnal diarrhea. He has had history of intermittent diarrhea predating his cholecystectomy. He states since his gallbladder was removed he's had more persistent diarrhea multiple times daily but since December up to 10 times daily. Would be concerned about C. difficile colitis given recent antibiotic use. We will check stool GI pathogen panel first. If this is negative may consider CT abdomen and pelvis to further evaluate left-sided abdominal pain given history of recurrent diverticulitis. We'll not rule out potential need for colonoscopy based on workup.  Provided Rx for Zofran, Bentyl.

## 2016-06-19 NOTE — Progress Notes (Signed)
cc'ed to pcp °

## 2016-07-04 ENCOUNTER — Emergency Department (HOSPITAL_COMMUNITY)
Admission: EM | Admit: 2016-07-04 | Discharge: 2016-07-05 | Disposition: A | Discharge status: Home / Self Care | Payer: BLUE CROSS/BLUE SHIELD | Attending: Emergency Medicine | Admitting: Emergency Medicine

## 2016-07-04 ENCOUNTER — Encounter (HOSPITAL_COMMUNITY): Payer: Self-pay | Admitting: Emergency Medicine

## 2016-07-04 ENCOUNTER — Emergency Department (HOSPITAL_COMMUNITY): Payer: BLUE CROSS/BLUE SHIELD

## 2016-07-04 DIAGNOSIS — E039 Hypothyroidism, unspecified: Secondary | ICD-10-CM | POA: Diagnosis not present

## 2016-07-04 DIAGNOSIS — T7840XA Allergy, unspecified, initial encounter: Secondary | ICD-10-CM | POA: Insufficient documentation

## 2016-07-04 DIAGNOSIS — I10 Essential (primary) hypertension: Secondary | ICD-10-CM | POA: Insufficient documentation

## 2016-07-04 DIAGNOSIS — Z79899 Other long term (current) drug therapy: Secondary | ICD-10-CM | POA: Insufficient documentation

## 2016-07-04 DIAGNOSIS — I2510 Atherosclerotic heart disease of native coronary artery without angina pectoris: Secondary | ICD-10-CM | POA: Diagnosis not present

## 2016-07-04 DIAGNOSIS — F17210 Nicotine dependence, cigarettes, uncomplicated: Secondary | ICD-10-CM | POA: Diagnosis not present

## 2016-07-04 LAB — CBC WITH DIFFERENTIAL/PLATELET
Basophils Absolute: 0.1 10*3/uL (ref 0.0–0.1)
Basophils Relative: 0 %
Eosinophils Absolute: 0.2 10*3/uL (ref 0.0–0.7)
Eosinophils Relative: 1 %
HCT: 47.8 % (ref 39.0–52.0)
Hemoglobin: 17.4 g/dL — ABNORMAL HIGH (ref 13.0–17.0)
Lymphocytes Relative: 21 %
Lymphs Abs: 3.6 10*3/uL (ref 0.7–4.0)
MCH: 33.3 pg (ref 26.0–34.0)
MCHC: 36.4 g/dL — ABNORMAL HIGH (ref 30.0–36.0)
MCV: 91.4 fL (ref 78.0–100.0)
Monocytes Absolute: 1.2 10*3/uL — ABNORMAL HIGH (ref 0.1–1.0)
Monocytes Relative: 7 %
Neutro Abs: 11.6 10*3/uL — ABNORMAL HIGH (ref 1.7–7.7)
Neutrophils Relative %: 71 %
Platelets: 274 10*3/uL (ref 150–400)
RBC: 5.23 MIL/uL (ref 4.22–5.81)
RDW: 12.6 % (ref 11.5–15.5)
WBC: 16.6 10*3/uL — ABNORMAL HIGH (ref 4.0–10.5)

## 2016-07-04 LAB — I-STAT CHEM 8, ED
BUN: 12 mg/dL (ref 6–20)
Calcium, Ion: 1.11 mmol/L — ABNORMAL LOW (ref 1.15–1.40)
Chloride: 103 mmol/L (ref 101–111)
Creatinine, Ser: 1.2 mg/dL (ref 0.61–1.24)
Glucose, Bld: 134 mg/dL — ABNORMAL HIGH (ref 65–99)
HCT: 50 % (ref 39.0–52.0)
Hemoglobin: 17 g/dL (ref 13.0–17.0)
Potassium: 3.4 mmol/L — ABNORMAL LOW (ref 3.5–5.1)
Sodium: 141 mmol/L (ref 135–145)
TCO2: 24 mmol/L (ref 0–100)

## 2016-07-04 LAB — I-STAT TROPONIN, ED: Troponin i, poc: 0.01 ng/mL (ref 0.00–0.08)

## 2016-07-04 MED ORDER — MORPHINE SULFATE (PF) 4 MG/ML IV SOLN
4.0000 mg | Freq: Once | INTRAVENOUS | Status: AC
  Administered 2016-07-04: 4 mg via INTRAVENOUS
  Filled 2016-07-04: qty 1

## 2016-07-04 MED ORDER — SODIUM CHLORIDE 0.9 % IV BOLUS (SEPSIS)
1000.0000 mL | Freq: Once | INTRAVENOUS | Status: AC
  Administered 2016-07-04: 1000 mL via INTRAVENOUS

## 2016-07-04 MED ORDER — IPRATROPIUM-ALBUTEROL 0.5-2.5 (3) MG/3ML IN SOLN: 3.0000 mL | Freq: Once | RESPIRATORY_TRACT | Status: DC

## 2016-07-04 MED ORDER — PANTOPRAZOLE SODIUM 40 MG IV SOLR
40.0000 mg | Freq: Once | INTRAVENOUS | Status: AC
  Administered 2016-07-04: 40 mg via INTRAVENOUS
  Filled 2016-07-04: qty 40

## 2016-07-04 MED ORDER — METHYLPREDNISOLONE SODIUM SUCC 125 MG IJ SOLR
125.0000 mg | Freq: Once | INTRAMUSCULAR | Status: AC
  Administered 2016-07-04: 125 mg via INTRAVENOUS
  Filled 2016-07-04: qty 2

## 2016-07-04 MED ORDER — DIPHENHYDRAMINE HCL 50 MG/ML IJ SOLN
25.0000 mg | Freq: Once | INTRAMUSCULAR | Status: AC
  Administered 2016-07-04: 25 mg via INTRAVENOUS
  Filled 2016-07-04: qty 1

## 2016-07-04 NOTE — ED Triage Notes (Addendum)
Patient states he ate yeast rolls approximately 2 hours prior to arrival to ER. States he is allergic to yeast. Patient has hives noted to head, torso, and back at triage. Patient states "my mouth feels different and it's a little hard to breathe." Patient has some oral swelling at triage. Patient had 75 mg benadryl prior to arrival. Patient also states he took his Lorrin Mais "because I was getting ready for bed when this started."

## 2016-07-04 NOTE — ED Provider Notes (Signed)
[facility] DEPT Provider Note   CSN: [id number] Arrival date & time: 07/04/16  2136   By signing my name below, I, Hilbert Odor, attest that this documentation has been prepared under the direction and in the presence of Milton Ferguson, MD. Electronically Signed: Hilbert Odor, Scribe. 07/04/16. 10:05 PM. History   Chief Complaint Chief Complaint  Patient presents with  . Allergic Reaction    HPI Comments: Andrew Love is a 51 y.o. male who presents to the Emergency Department complaining of shortness of breath prior to his arrival. Patient states that he ate yeast rolls 2 hours prior to coming to the ED. 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 The patient states that he has had an anaphylaxis episode in the past. He states that he took 50 mg of benadryl earlier today and then took another 25 mg before coming to the ED. He also reports noticing rashes appear on his chest and back. He has a hx of HTN.  The history is provided by the patient and a relative. No language interpreter was used.  Allergic Reaction  Presenting symptoms: difficulty breathing and rash (Chest and back)   Difficulty breathing:    Severity:  Severe   Onset quality:  Sudden   Timing:  Constant   Progression:  Worsening Rash:    Location:  Chest and back   Severity:  Severe Severity:  Severe Context: food   Worsened by:  Nothing   Past Medical History:  Diagnosis Date  . Arthritis   . Back pain    chronic  . Bronchitis    history of  . CAD (coronary artery disease)   . Chronic back pain   . Chronic insomnia    Per medical history form dated 06/13/10.  . Chronic left hip pain   . Colitis    Per medical history from dated 06/13/10.  . Diverticulitis    Hx of; requiring 3 admissions  . Elevated liver enzymes   . Hypertension   . Hypothyroidism   . Left leg pain    chronic  . 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  Psoriasis    Per medical history form dated 06/13/10.  . Sigmoid colon ulcer    Rectal polyps  . Sleep apnea with use of continuous positive airway pressure (CPAP)     Patient Active Problem List   Diagnosis Date Noted  . LUQ pain 06/19/2016  . Symptomatic cholelithiasis 08/30/2015  . Preoperative cardiovascular examination   . Coronary artery disease involving native coronary artery of native heart without angina pectoris   . Gastroenteritis 08/26/2015  . Cholelithiases 08/24/2015  . Abdominal pain 08/24/2015  . Cholecystitis, acute 08/24/2015  . Diarrhea 08/24/2015  . Acute cholecystitis 08/24/2015  . Nausea without vomiting 04/08/2015  . Diverticulitis of colon   . Cerebral thrombosis with cerebral infarction ([place]) 03/21/2014  . Stroke ([place]) 03/21/2014  . Essential hypertension 03/21/2014  . TIA (transient ischemic attack)   . HLD (hyperlipidemia)   . Thyroid activity decreased   . Colitis 11/20/2012  . CAD (coronary artery disease)   . Hip pain 08/04/2011  . 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 LIVER FUNCTION TESTS, ABNORMAL, HX OF 12/17/2008    Past Surgical History:  Procedure Laterality Date  . APPENDECTOMY     8th grade  . CARDIAC CATHETERIZATION  2009  . CARDIAC CATHETERIZATION     Per medical history from dated 06/13/10.  Marland Kitchen  CERVICAL SPINE SURGERY     C4, C5, C6 spinal fusion  . CHOLECYSTECTOMY N/A 08/31/2015   Procedure: LAPAROSCOPIC CHOLECYSTECTOMY WITH INTRAOPERATIVE CHOLANGIOGRAM;  Surgeon: Excell Seltzer, MD;  Location: [facility];  Service: General;  Laterality: N/A;  . COLONOSCOPY  07/2008   Colitis,NSAID v. Ischemia,malrotation of the gut,Diverticulosis(L),hyperplastic  . COLONOSCOPY N/A 12/13/2012   Dr. Oneida Alar: Normal TI, mild sigmoid diverticulosis, hemorrhoids, 2 polyps (tubular adenoma). Random colon bx negative. Next TCS 12/2022 with Fentanyl/phenergan  . ESOPHAGOGASTRODUODENOSCOPY N/A 04/16/2014   RMR: Erosive reflux  esophagitis. Non critical Schzki's ring not manipulated. Small hiatal hernia. Abnormal gastirc mucosa of doubtful signigicance status post biopsy. I suspect trivial upper GI bleed. Recent abdominal pain presumably secondary to a protracted bout of diverticulitis. CT scan November 23 revealed improvemetn without complication. He finished his antibiotics 2 days age. Left sided ab  . JOINT REPLACEMENT    . LAPAROSCOPIC SIGMOID COLECTOMY  2012   Dr. Fanny Skates: recurrent sigmoid diverticulitis  . LAPAROSCOPIC SIGMOID COLECTOMY  2012   recurernt sigmoid diverticulitis, Dr. Dalbert Batman   . ROTATOR CUFF REPAIR     Left - per medical history form dated 06/13/10.  Marland Kitchen SHOULDER SURGERY     Left  . THYROIDECTOMY     Per medical history form dated 06/13/10.  Marland Kitchen TOTAL HIP ARTHROPLASTY  07/04/2011   Procedure: TOTAL HIP ARTHROPLASTY;  Surgeon: Johnny Bridge, MD;  Location: [facility];  Service: Orthopedics;  Laterality: Right;       Home Medications    Prior to Admission medications   Medication Sig Start Date End Date Taking? Authorizing Provider  acetaminophen (TYLENOL) 500 MG tablet Take 1,000 mg by mouth every 6 (six) hours as needed for mild pain, moderate pain, fever or headache.    Historical Provider, MD  cyclobenzaprine (FLEXERIL) 10 MG tablet Take 1 tablet (10 mg total) by mouth 3 (three) times daily as needed. Patient taking differently: Take 10 mg by mouth 3 (three) times daily as needed for muscle spasms.  04/27/15   Rolland Porter, MD  dicyclomine (BENTYL) 10 MG capsule Take 1 capsule (10 mg total) by mouth 4 (four) times daily -  before meals and at bedtime. 06/19/16   Mahala Menghini, PA-C  diphenhydrAMINE (BENADRYL) 25 MG tablet Take 50 mg by mouth every 6 (six) hours as needed for allergies.     Historical Provider, MD  EPINEPHrine (EPIPEN) 0.3 mg/0.3 mL SOAJ Inject 0.3 mLs (0.3 mg total) into the muscle as needed. Patient taking differently: Inject 0.3 mg into the muscle daily as needed (allergic  reaction).  12/01/12   Teressa Lower, MD  ibuprofen (ADVIL,MOTRIN) 600 MG tablet Take 1 tablet (600 mg total) by mouth every 8 (eight) hours as needed. 01/21/16   Jola Schmidt, MD  levothyroxine (SYNTHROID, LEVOTHROID) 300 MCG tablet Take 300 mcg by mouth daily. 03/26/14   Historical Provider, MD  methocarbamol (ROBAXIN) 500 MG tablet Take 1 tablet (500 mg total) by mouth every 8 (eight) hours as needed for muscle spasms. 01/21/16   Jola Schmidt, MD  olmesartan (BENICAR) 20 MG tablet Take 20 mg by mouth daily.     Historical Provider, MD  ondansetron (ZOFRAN ODT) 8 MG disintegrating tablet Take 1 tablet (8 mg total) by mouth every 8 (eight) hours as needed for nausea or vomiting. 06/19/16   Mahala Menghini, PA-C  pantoprazole (PROTONIX) 40 MG tablet Take 1 tablet (40 mg total) by mouth daily. 10/19/15   Carlis Stable, NP  zolpidem Lorrin Mais)  10 MG tablet Take 10 mg by mouth at bedtime.     Historical Provider, MD    Family History Family History  Problem Relation Age of Onset  . Cancer Mother     breast cancer - per medical history form dated 06/13/10.  . Diverticulitis Mother   . Hypertension Mother   . Cancer Father     skin - per medical history form dated 06/13/10.  Marland Kitchen Hypertension Father   . Anesthesia problems Neg Hx   . Hypotension Neg Hx   . Malignant hyperthermia Neg Hx   . Pseudochol deficiency Neg Hx   . Colon cancer Neg Hx     Social History Social History  Substance Use Topics  . Smoking status: Current Every Day Smoker    Packs/day: 0.50    Years: 18.00    Types: Cigarettes  . Smokeless tobacco: Never Used     Comment:    . Alcohol use No     Allergies   Other; Iohexol; Shrimp [shellfish allergy]; and Wheat bran   Review of Systems Review of Systems  Constitutional: Negative for appetite change and fatigue.  HENT: Negative for congestion, ear discharge and sinus pressure.   Eyes: Negative for discharge.  Respiratory: Positive for shortness of breath. Negative for cough.    Cardiovascular: Negative for chest pain.  Gastrointestinal: Negative for abdominal pain and diarrhea.  Genitourinary: Negative for frequency and hematuria.  Musculoskeletal: Negative for back pain.  Skin: Positive for rash (Chest and back).  Neurological: Negative for seizures and headaches.  Psychiatric/Behavioral: Negative for hallucinations.  All other systems reviewed and are negative.    Physical Exam Updated Vital Signs BP 135/91 (BP Location: Right Arm)   Pulse 111   Temp 98 F (36.7 C) (Oral)   Resp 24   Ht 6\' 1"  (1.854 m)   Wt 242 lb (109.8 kg)   SpO2 94%   BMI 31.93 kg/m   Physical Exam  Constitutional: He is oriented to person, place, and time. He appears well-developed.  HENT:  Head: Normocephalic.  Eyes: Conjunctivae and EOM are normal. No scleral icterus.  Neck: Neck supple. No thyromegaly present.  Cardiovascular: Normal rate and regular rhythm.  Exam reveals no gallop and no friction rub.   No murmur heard. Pulmonary/Chest: No stridor. He has no wheezes. He has no rales. He exhibits no tenderness.  Abdominal: He exhibits no distension. There is no tenderness. There is no rebound.  Musculoskeletal: Normal range of motion. He exhibits no edema.  Lymphadenopathy:    He has no cervical adenopathy.  Neurological: He is oriented to person, place, and time. He exhibits normal muscle tone. Coordination normal.  Skin:  Welts on chest and upper back.  Psychiatric: He has a normal mood and affect. His behavior is normal.  Nursing note and vitals reviewed.   ED Treatments / Results  DIAGNOSTIC STUDIES: Oxygen Saturation is 94% on RA, adequate by my interpretation.    COORDINATION OF CARE: 9:40 PM Discussed treatment plan with pt at bedside, which includes labs, and pt agreed to plan.  Labs (all labs ordered are listed, but only abnormal results are displayed) Labs Reviewed  I-STAT CHEM 8, ED - Abnormal; Notable for the following:       Result Value    Potassium 3.4 (*)    Glucose, Bld 134 (*)    Calcium, Ion 1.11 (*)    All other components within normal limits    EKG  EKG Interpretation None  Radiology No results found.  Procedures Procedures (including critical care time)  Medications Ordered in ED Medications  pantoprazole (PROTONIX) injection 40 mg (40 mg Intravenous Given 07/04/16 2152)  diphenhydrAMINE (BENADRYL) injection 25 mg (25 mg Intravenous Given 07/04/16 2152)  sodium chloride 0.9 % bolus 1,000 mL (1,000 mLs Intravenous New Bag/Given 07/04/16 2151)  methylPREDNISolone sodium succinate (SOLU-MEDROL) 125 mg/2 mL injection 125 mg (125 mg Intravenous Given 07/04/16 2152)     Initial Impression / Assessment and Plan / ED Course  I have reviewed the triage vital signs and the nursing notes.  Pertinent labs & imaging results that were available during my care of the patient were reviewed by me and considered in my medical decision making (see chart for details).       Final Clinical Impressions(s) / ED Diagnoses   Final diagnoses:  None    New Prescriptions New Prescriptions   No medications on file   The chart was scribed for me under my direct supervision.  I personally performed the history, physical, and medical decision making and all procedures in the evaluation of this patient.Milton Ferguson, MD 07/14/16 [phone]

## 2016-07-05 LAB — TROPONIN I
Troponin I: <0.03 ng/mL (ref <0.03)
Troponin I: <0.03 ng/mL (ref <0.03)

## 2016-07-05 MED ORDER — PREDNISONE 50 MG PO TABS: ORAL_TABLET | ORAL | 0 refills | Status: DC

## 2016-07-05 MED ORDER — NITROGLYCERIN 0.4 MG SL SUBL
0.4000 mg | SUBLINGUAL_TABLET | Freq: Once | SUBLINGUAL | Status: AC
  Administered 2016-07-05: 0.4 mg via SUBLINGUAL
  Filled 2016-07-05: qty 1

## 2016-07-05 MED ORDER — SODIUM CHLORIDE 0.9 % IV BOLUS (SEPSIS)
1000.0000 mL | Freq: Once | INTRAVENOUS | Status: AC
  Administered 2016-07-05: 1000 mL via INTRAVENOUS

## 2016-07-05 MED ORDER — FAMOTIDINE 20 MG PO TABS: 20.0000 mg | ORAL_TABLET | Freq: Two times a day (BID) | ORAL | 0 refills | Status: DC

## 2016-07-05 NOTE — ED Notes (Signed)
Pt states understanding of care given and follow up instructions.  Pt a/o, ambulated from ED with S/O steady gait.

## 2016-07-05 NOTE — Discharge Instructions (Signed)
Take the steroids and antihistamines as prescribed. Use your epinephrine pen as needed for severe allergic reaction including difficulty breathing or difficulty swallowing. Benicar can sometimes cause tongue or lip swelling so you should stop that and discuss with Dr. Gerarda Fraction. Follow up with your doctor for a stress test. Return to the ED if you develop difficulty breathing, chest pain, or swelling or any other concerns.

## 2016-07-05 NOTE — ED Provider Notes (Signed)
Care assumed from Dr. Roderic Palau. Patient seen earlier and treated for allergic reaction to bread products. He received antihistamines and steroids. He did not receive any epinephrine. This feels improved. Patient then developed chest pain later throughout his stay in the ER that lasted about 2 hours. EKG is unchanged. Troponin negative. Chest pain resolved after one nitroglycerin.  Awaiting second troponin at 1:30 AM. Patient reports CAD remotely with "30% blockage" on catheterization many years ago. He does not know when his last stress test was. Remains chest pain-free. No wheezing. No tongue or lip swelling.  Recheck 3 AM. Repeat Troponin is negative. Patient with no further episodes of chest pain. EKG is unchanged. Patient has no tongue or lip swelling.  He has an epinephrine pen at home. We'll give course of steroids and antihistamines. Discussed follow up with his primary doctor. Discussed stopping ARB as that could have contributed to his reaction. May need to have repeat stress test. Instructed to return to the ED if he develops worsening chest pain, difficulty breathing, tongue or lip swelling or any other concerns.  BP 106/65   Pulse (!) 59   Temp 98 F (36.7 C) (Oral)   Resp 14   Ht 6\' 1"  (1.854 m)   Wt 242 lb (109.8 kg)   SpO2 96%   BMI 31.93 kg/m     Ezequiel Essex, MD 07/05/16 [phone]

## 2016-08-01 ENCOUNTER — Encounter (HOSPITAL_COMMUNITY): Payer: Self-pay | Admitting: Emergency Medicine

## 2016-08-01 ENCOUNTER — Emergency Department (HOSPITAL_COMMUNITY)
Admission: EM | Admit: 2016-08-01 | Discharge: 2016-08-01 | Disposition: A | Discharge status: Home / Self Care | Payer: BLUE CROSS/BLUE SHIELD | Attending: Emergency Medicine | Admitting: Emergency Medicine

## 2016-08-01 DIAGNOSIS — Z79899 Other long term (current) drug therapy: Secondary | ICD-10-CM | POA: Insufficient documentation

## 2016-08-01 DIAGNOSIS — R079 Chest pain, unspecified: Secondary | ICD-10-CM | POA: Insufficient documentation

## 2016-08-01 DIAGNOSIS — I10 Essential (primary) hypertension: Secondary | ICD-10-CM | POA: Diagnosis not present

## 2016-08-01 DIAGNOSIS — R1032 Left lower quadrant pain: Secondary | ICD-10-CM | POA: Diagnosis not present

## 2016-08-01 DIAGNOSIS — F17210 Nicotine dependence, cigarettes, uncomplicated: Secondary | ICD-10-CM | POA: Diagnosis not present

## 2016-08-01 DIAGNOSIS — I2510 Atherosclerotic heart disease of native coronary artery without angina pectoris: Secondary | ICD-10-CM | POA: Insufficient documentation

## 2016-08-01 DIAGNOSIS — K625 Hemorrhage of anus and rectum: Secondary | ICD-10-CM | POA: Insufficient documentation

## 2016-08-01 DIAGNOSIS — E039 Hypothyroidism, unspecified: Secondary | ICD-10-CM | POA: Insufficient documentation

## 2016-08-01 LAB — CBC
HCT: 47 % (ref 39.0–52.0)
Hemoglobin: 17 g/dL (ref 13.0–17.0)
MCH: 33.4 pg (ref 26.0–34.0)
MCHC: 36.2 g/dL — ABNORMAL HIGH (ref 30.0–36.0)
MCV: 92.3 fL (ref 78.0–100.0)
Platelets: 241 10*3/uL (ref 150–400)
RBC: 5.09 MIL/uL (ref 4.22–5.81)
RDW: 12.7 % (ref 11.5–15.5)
WBC: 10.9 10*3/uL — ABNORMAL HIGH (ref 4.0–10.5)

## 2016-08-01 LAB — COMPREHENSIVE METABOLIC PANEL
ALT: 47 U/L (ref 17–63)
AST: 30 U/L (ref 15–41)
Albumin: 4.4 g/dL (ref 3.5–5.0)
Alkaline Phosphatase: 109 U/L (ref 38–126)
Anion gap: 9 (ref 5–15)
BUN: 11 mg/dL (ref 6–20)
CO2: 24 mmol/L (ref 22–32)
Calcium: 9 mg/dL (ref 8.9–10.3)
Chloride: 104 mmol/L (ref 101–111)
Creatinine, Ser: 0.94 mg/dL (ref 0.61–1.24)
GFR calc Af Amer: >60 mL/min (ref >60)
GFR calc non Af Amer: >60 mL/min (ref >60)
Glucose, Bld: 127 mg/dL — ABNORMAL HIGH (ref 65–99)
Potassium: 3.5 mmol/L (ref 3.5–5.1)
Sodium: 137 mmol/L (ref 135–145)
Total Bilirubin: 0.6 mg/dL (ref 0.3–1.2)
Total Protein: 7.3 g/dL (ref 6.5–8.1)

## 2016-08-01 LAB — POC OCCULT BLOOD, ED: Fecal Occult Bld: NEGATIVE

## 2016-08-01 NOTE — ED Triage Notes (Signed)
Pt states he has been having abd pain with bright red blood in his stools for about 1 hour

## 2016-08-01 NOTE — ED Provider Notes (Signed)
[facility] DEPT Provider Note   CSN: [id number] Arrival date & time: 08/01/16  0002     History   Chief Complaint Chief Complaint  Patient presents with  . GI Bleeding    HPI Andrew Love is a 51 y.o. male.  The history is provided by the patient.  Rectal Bleeding  Quality:  Bright red Amount:  Moderate Timing:  Intermittent Chronicity:  New Context: diarrhea   Similar prior episodes: no   Relieved by:  None tried Worsened by:  Nothing Associated symptoms: abdominal pain   Associated symptoms: no fever and no vomiting   Risk factors: hx of colorectal surgery   Risk factors: no anticoagulant use and no hx of IBD   Patient reports he has suffered from multiple episodes of diarrhea for past several months with associated with abdominal pain. It is usually postprandial No fever/vomiting He reports CP earlier in the evening, but none at this time He had an episode of rectal bleeding earlier with his diarrhea and this is new for him He has been seen by GI in the past  No recent travel Past Medical History:  Diagnosis Date  . Arthritis   . Back pain    chronic  . Bronchitis    history of  . CAD (coronary artery disease)   . Chronic back pain   . Chronic insomnia    Per medical history form dated 06/13/10.  . Chronic left hip pain   . Colitis    Per medical history from dated 06/13/10.  . Diverticulitis    Hx of; requiring 3 admissions  . Elevated liver enzymes   . Hypertension   . Hypothyroidism   . Left leg pain    chronic  . 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 Psoriasis    Per medical history form dated 06/13/10.  . Sigmoid colon ulcer    Rectal polyps  . Sleep apnea with use of continuous positive airway pressure (CPAP)     Patient Active Problem List   Diagnosis Date Noted  . LUQ pain 06/19/2016  . Symptomatic cholelithiasis 08/30/2015  . Preoperative cardiovascular examination   . Coronary artery disease involving  native coronary artery of native heart without angina pectoris   . Gastroenteritis 08/26/2015  . Cholelithiases 08/24/2015  . Abdominal pain 08/24/2015  . Cholecystitis, acute 08/24/2015  . Diarrhea 08/24/2015  . Acute cholecystitis 08/24/2015  . Nausea without vomiting 04/08/2015  . Diverticulitis of colon   . Cerebral thrombosis with cerebral infarction ([place]) 03/21/2014  . Stroke ([place]) 03/21/2014  . Essential hypertension 03/21/2014  . TIA (transient ischemic attack)   . HLD (hyperlipidemia)   . Thyroid activity decreased   . Colitis 11/20/2012  . CAD (coronary artery disease)   . Hip pain 08/04/2011  . 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 LIVER FUNCTION TESTS, ABNORMAL, HX OF 12/17/2008    Past Surgical History:  Procedure Laterality Date  . APPENDECTOMY     8th grade  . CARDIAC CATHETERIZATION  2009  . CARDIAC CATHETERIZATION     Per medical history from dated 06/13/10.  . CERVICAL SPINE SURGERY     C4, C5, C6 spinal fusion  . CHOLECYSTECTOMY N/A 08/31/2015   Procedure: LAPAROSCOPIC CHOLECYSTECTOMY WITH INTRAOPERATIVE CHOLANGIOGRAM;  Surgeon: Excell Seltzer, MD;  Location: [facility];  Service: General;  Laterality: N/A;  . COLONOSCOPY  07/2008   Colitis,NSAID v. Ischemia,malrotation of the gut,Diverticulosis(L),hyperplastic  . COLONOSCOPY N/A 12/13/2012   Dr. Oneida Alar: Normal  TI, mild sigmoid diverticulosis, hemorrhoids, 2 polyps (tubular adenoma). Random colon bx negative. Next TCS 12/2022 with Fentanyl/phenergan  . ESOPHAGOGASTRODUODENOSCOPY N/A 04/16/2014   RMR: Erosive reflux esophagitis. Non critical Schzki's ring not manipulated. Small hiatal hernia. Abnormal gastirc mucosa of doubtful signigicance status post biopsy. I suspect trivial upper GI bleed. Recent abdominal pain presumably secondary to a protracted bout of diverticulitis. CT scan November 23 revealed improvemetn without complication. He finished his antibiotics 2 days age. Left sided ab  .  JOINT REPLACEMENT    . LAPAROSCOPIC SIGMOID COLECTOMY  2012   Dr. Fanny Skates: recurrent sigmoid diverticulitis  . LAPAROSCOPIC SIGMOID COLECTOMY  2012   recurernt sigmoid diverticulitis, Dr. Dalbert Batman   . ROTATOR CUFF REPAIR     Left - per medical history form dated 06/13/10.  Marland Kitchen SHOULDER SURGERY     Left  . THYROIDECTOMY     Per medical history form dated 06/13/10.  Marland Kitchen TOTAL HIP ARTHROPLASTY  07/04/2011   Procedure: TOTAL HIP ARTHROPLASTY;  Surgeon: Johnny Bridge, MD;  Location: [facility];  Service: Orthopedics;  Laterality: Right;       Home Medications    Prior to Admission medications   Medication Sig Start Date End Date Taking? Authorizing Provider  acetaminophen (TYLENOL) 500 MG tablet Take 1,000 mg by mouth every 6 (six) hours as needed for mild pain, moderate pain, fever or headache.    Historical Provider, MD  dicyclomine (BENTYL) 10 MG capsule Take 1 capsule (10 mg total) by mouth 4 (four) times daily -  before meals and at bedtime. 06/19/16   Mahala Menghini, PA-C  diphenhydrAMINE (BENADRYL) 25 MG tablet Take 50 mg by mouth every 6 (six) hours as needed for allergies.     Historical Provider, MD  EPINEPHrine (EPIPEN) 0.3 mg/0.3 mL SOAJ Inject 0.3 mLs (0.3 mg total) into the muscle as needed. Patient taking differently: Inject 0.3 mg into the muscle daily as needed (allergic reaction).  12/01/12   Teressa Lower, MD  famotidine (PEPCID) 20 MG tablet Take 1 tablet (20 mg total) by mouth 2 (two) times daily. 07/05/16   Ezequiel Essex, MD  hydrocortisone cream 1 % Apply 1 application topically once as needed for itching.    Historical Provider, MD  levothyroxine (SYNTHROID, LEVOTHROID) 300 MCG tablet Take 300 mcg by mouth daily. 03/26/14   Historical Provider, MD  olmesartan (BENICAR) 20 MG tablet Take 20 mg by mouth daily.     Historical Provider, MD  ondansetron (ZOFRAN ODT) 8 MG disintegrating tablet Take 1 tablet (8 mg total) by mouth every 8 (eight) hours as needed for nausea or  vomiting. 06/19/16   Mahala Menghini, PA-C  pantoprazole (PROTONIX) 40 MG tablet Take 1 tablet (40 mg total) by mouth daily. 10/19/15   Carlis Stable, NP  predniSONE (DELTASONE) 50 MG tablet 1 tablet PO daily 07/05/16   Ezequiel Essex, MD  zolpidem (AMBIEN) 10 MG tablet Take 10 mg by mouth at bedtime.     Historical Provider, MD    Family History Family History  Problem Relation Age of Onset  . Cancer Mother     breast cancer - per medical history form dated 06/13/10.  . Diverticulitis Mother   . Hypertension Mother   . Cancer Father     skin - per medical history form dated 06/13/10.  Marland Kitchen Hypertension Father   . Anesthesia problems Neg Hx   . Hypotension Neg Hx   . Malignant hyperthermia Neg Hx   . Pseudochol deficiency  Neg Hx   . Colon cancer Neg Hx     Social History Social History  Substance Use Topics  . Smoking status: Current Every Day Smoker    Packs/day: 0.50    Years: 18.00    Types: Cigarettes  . Smokeless tobacco: Never Used     Comment:    . Alcohol use No     Allergies   Other; Iohexol; Shrimp [shellfish allergy]; Wheat bran; and Yeast-related products   Review of Systems Review of Systems  Constitutional: Negative for fever.  Cardiovascular: Positive for chest pain.  Gastrointestinal: Positive for abdominal pain, blood in stool and hematochezia. Negative for vomiting.  All other systems reviewed and are negative.    Physical Exam Updated Vital Signs BP (!) 149/90 (BP Location: Left Arm)   Pulse 99   Temp 98.5 F (36.9 C) (Oral)   Resp 18   Ht 6\' 1"  (1.854 m)   Wt 106.1 kg   SpO2 96%   BMI 30.87 kg/m   Physical Exam CONSTITUTIONAL: Well developed/well nourished HEAD: Normocephalic/atraumatic EYES: EOMI/PERRL, conjunctiva pink ENMT: Mucous membranes moist NECK: supple no meningeal signs SPINE/BACK:entire spine nontender CV: S1/S2 noted, no murmurs/rubs/gallops noted LUNGS: Lungs are clear to auscultation bilaterally, no apparent  distress ABDOMEN: soft, mild LUQ tenderness , no rebound or guarding, bowel sounds noted throughout abdomen GU:no cva tenderness Rectal - stool brown, no melena or blood, no mass External hemorrhoids noted, prostate enlarged Nurse chaperone present NEURO: Pt is awake/alert/appropriate, moves all extremitiesx4.  No facial droop.   EXTREMITIES: pulses normal/equal, full ROM SKIN: warm, color normal PSYCH: no abnormalities of mood noted, alert and oriented to situation   ED Treatments / Results  Labs (all labs ordered are listed, but only abnormal results are displayed) Labs Reviewed  COMPREHENSIVE METABOLIC PANEL - Abnormal; Notable for the following:       Result Value   Glucose, Bld 127 (*)    All other components within normal limits  CBC - Abnormal; Notable for the following:    WBC 10.9 (*)    MCHC 36.2 (*)    All other components within normal limits  POC OCCULT BLOOD, ED    EKG  EKG Interpretation  Date/Time:  Tuesday August 01 2016 01:50:33 EDT Ventricular Rate:  89 PR Interval:    QRS Duration: 91 QT Interval:  354 QTC Calculation: 431 R Axis:   -9 Text Interpretation:  Sinus rhythm Normal ECG No significant change since last tracing Confirmed by Christy Gentles  MD, Dixie Coppa ([id number]) on 08/01/2016 2:00:15 AM       Radiology No results found.  Procedures Procedures (including critical care time)  Medications Ordered in ED Medications - No data to display   Initial Impression / Assessment and Plan / ED Course  I have reviewed the triage vital signs and the nursing notes.  Pertinent labs   results that were available during my care of the patient were reviewed by me and considered in my medical decision making (see chart for details).     Pt stable No signs of acute GI bleed at this time Suspect he irritation or hemorrhoidal bleeding due to persistent diarrhea Diarrhea/ABD pain present for months He is well appearing He has already been seen by GI before - he  was referred back to GI   Final Clinical Impressions(s) / ED Diagnoses   Final diagnoses:  Rectal bleeding    New Prescriptions New Prescriptions   No medications on file     Elenore Rota  Christy Gentles, MD 08/01/16 [phone]

## 2016-08-01 NOTE — ED Notes (Signed)
ED Provider at bedside. 

## 2016-08-15 ENCOUNTER — Telehealth: Payer: Self-pay | Admitting: Gastroenterology

## 2016-08-15 ENCOUNTER — Ambulatory Visit (INDEPENDENT_AMBULATORY_CARE_PROVIDER_SITE_OTHER): Payer: BLUE CROSS/BLUE SHIELD | Admitting: Gastroenterology

## 2016-08-15 ENCOUNTER — Other Ambulatory Visit: Payer: Self-pay

## 2016-08-15 ENCOUNTER — Encounter: Payer: Self-pay | Admitting: Gastroenterology

## 2016-08-15 VITALS — BP 127/89 | HR 76 | Temp 97.9°F | Ht 73.0 in | Wt 245.0 lb

## 2016-08-15 DIAGNOSIS — R1032 Left lower quadrant pain: Secondary | ICD-10-CM

## 2016-08-15 NOTE — Telephone Encounter (Signed)
Pt is aware.  

## 2016-08-15 NOTE — Progress Notes (Addendum)
REVIEWED-NO ADDITIONAL RECOMMENDATIONS.  Referring Provider: Redmond School, MD Primary Care Physician:  Glo Herring, MD Primary GI: Dr. Oneida Alar   Chief Complaint  Patient presents with  . Rectal Bleeding    not now, occurred x 2; went to ER at [facility]  . Diarrhea    "everything goes straight through", stool is soft    HPI:   Andrew Love is a 51 y.o. male presenting today with several month history diarrhea, abdominal pain. 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 Last seen Feb 2018 with nocturnal diarrhea, abdominal pain, and GI pathogen panel requested. This was not completed due to soft stools, not diarrhea. Last colonoscopy in 2014 with tubular adenomas.   Recently seen in ED. Normal LFTs. Mild leukocytosis at 10.9. In Feb 2018, WBC count 16.6. If eats, has to be close to a bathroom. Sometimes going 7-10 times per day. Postprandial urgency. Had an episode the other week where he went to [facility] due to rectal bleeding. Was dripping blood and felt significant. Has happened an additional time since then. Spending more time in the bathroom more than anything. Heme negative in the ED. Noticed symptoms of frequent stools since October/November and keeps getting worse. Every day is at least 4-5 times per day. Gaining weight. Has LLQ pain, sometimes going away after BM, sometimes not. Will have to get in a fetal position and put a pillow on his stomach sometimes. Pain is in similar area where he had diverticulitis before. Eats a lot of chicken and tries to avoid red meat, spicy foods. Has gotten to the point where even food bothers him. No fever. Sometimes has chills after going to the bathroom a lot. Seldom NSAIDs.  Bentyl helped "so-so".  Broke out in hives during CT with IV contrast in 2015.   Past Medical History:  Diagnosis Date  . Arthritis   . Back  pain    chronic  . Bronchitis    history of  . CAD (coronary artery disease)   . Chronic back pain   . Chronic insomnia    Per medical history form dated 06/13/10.  . Chronic left hip pain   . Colitis    Per medical history from dated 06/13/10.  . Diverticulitis    Hx of; requiring 3 admissions  . Elevated liver enzymes   . Hypertension   . Hypothyroidism   . Left leg pain    chronic  . 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 Psoriasis    Per medical history form dated 06/13/10.  . Sigmoid colon ulcer    Rectal polyps  . Sleep apnea with use of continuous positive airway pressure (CPAP)     Past Surgical History:  Procedure Laterality Date  . APPENDECTOMY     8th grade  . CARDIAC CATHETERIZATION  2009  . CARDIAC CATHETERIZATION     Per medical history from dated 06/13/10.  . CERVICAL SPINE SURGERY     C4, C5, C6 spinal fusion  . CHOLECYSTECTOMY N/A 08/31/2015   Procedure: LAPAROSCOPIC CHOLECYSTECTOMY WITH INTRAOPERATIVE CHOLANGIOGRAM;  Surgeon: Excell Seltzer, MD;  Location: [facility];  Service: General;  Laterality: N/A;  . COLONOSCOPY  07/2008   Colitis,NSAID v. Ischemia,malrotation of the gut,Diverticulosis(L),hyperplastic  . COLONOSCOPY N/A 12/13/2012   Dr. Oneida Alar: Normal TI, mild sigmoid diverticulosis, hemorrhoids, 2 polyps (tubular adenoma). Random colon bx negative. Next TCS 12/2022 with Fentanyl/phenergan  .  ESOPHAGOGASTRODUODENOSCOPY N/A 04/16/2014   RMR: Erosive reflux esophagitis. Non critical Schzki's ring not manipulated. Small hiatal hernia. Abnormal gastirc mucosa of doubtful signigicance status post biopsy. I suspect trivial upper GI bleed. Recent abdominal pain presumably secondary to a protracted bout of diverticulitis. CT scan November 23 revealed improvemetn without complication. He finished his antibiotics 2 days age. Left sided ab  . JOINT REPLACEMENT    . LAPAROSCOPIC SIGMOID COLECTOMY  2012   Dr. Fanny Skates: recurrent sigmoid  diverticulitis  . LAPAROSCOPIC SIGMOID COLECTOMY  2012   recurernt sigmoid diverticulitis, Dr. Dalbert Batman   . ROTATOR CUFF REPAIR     Left - per medical history form dated 06/13/10.  Marland Kitchen SHOULDER SURGERY     Left  . THYROIDECTOMY     Per medical history form dated 06/13/10.  Marland Kitchen TOTAL HIP ARTHROPLASTY  07/04/2011   Procedure: TOTAL HIP ARTHROPLASTY;  Surgeon: Johnny Bridge, MD;  Location: [facility];  Service: Orthopedics;  Laterality: Right;    Current Outpatient Prescriptions  Medication Sig Dispense Refill  . acetaminophen (TYLENOL) 500 MG tablet Take 1,000 mg by mouth every 6 (six) hours as needed for mild pain, moderate pain, fever or headache.    . diphenhydrAMINE (BENADRYL) 25 MG tablet Take 50 mg by mouth every 6 (six) hours as needed for allergies.     Marland Kitchen EPINEPHrine (EPIPEN) 0.3 mg/0.3 mL SOAJ Inject 0.3 mLs (0.3 mg total) into the muscle as needed. (Patient taking differently: Inject 0.3 mg into the muscle daily as needed (allergic reaction). ) 1 Device 3  . famotidine (PEPCID) 20 MG tablet Take 1 tablet (20 mg total) by mouth 2 (two) times daily. 30 tablet 0  . hydrocortisone cream 1 % Apply 1 application topically once as needed for itching.    . levothyroxine (SYNTHROID, LEVOTHROID) 300 MCG tablet Take 300 mcg by mouth daily.    Marland Kitchen olmesartan (BENICAR) 20 MG tablet Take 20 mg by mouth daily.     . ondansetron (ZOFRAN ODT) 8 MG disintegrating tablet Take 1 tablet (8 mg total) by mouth every 8 (eight) hours as needed for nausea or vomiting. 20 tablet 0  . pantoprazole (PROTONIX) 40 MG tablet Take 1 tablet (40 mg total) by mouth daily. 60 tablet 11  . zolpidem (AMBIEN) 10 MG tablet Take 10 mg by mouth at bedtime.     . dicyclomine (BENTYL) 10 MG capsule Take 1 capsule (10 mg total) by mouth 4 (four) times daily -  before meals and at bedtime. (Patient not taking: Reported on 08/15/2016) 120 capsule 3  . predniSONE (DELTASONE) 50 MG tablet 1 tablet PO daily (Patient not taking: Reported on  08/15/2016) 5 tablet 0   No current facility-administered medications for this visit.     Allergies as of 08/15/2016 - Review Complete 08/15/2016  Allergen Reaction Noted  . Other Hives and Shortness Of Breath 01/08/2013  . Iohexol Hives 03/31/2014  . Shrimp [shellfish allergy] Hives 06/19/2016  . Wheat bran Hives 06/19/2016  . Yeast-related products Hives 07/04/2016    Family History  Problem Relation Age of Onset  . Cancer Mother     breast cancer - per medical history form dated 06/13/10.  . Diverticulitis Mother   . Hypertension Mother   . Cancer Father     skin - per medical history form dated 06/13/10.  Marland Kitchen Hypertension Father   . Anesthesia problems Neg Hx   . Hypotension Neg Hx   . Malignant hyperthermia Neg Hx   . Pseudochol deficiency  Neg Hx   . Colon cancer Neg Hx     Social History   Social History  . Marital status: Married    Spouse name: N/A  . Number of children: N/A  . Years of education: N/A   Occupational History  . [employer] Unemployed   Social History Main Topics  . Smoking status: Current Every Day Smoker    Packs/day: 0.50    Years: 18.00    Types: Cigarettes  . Smokeless tobacco: Never Used     Comment:    . Alcohol use No  . Drug use: No  . Sexual activity: Not Asked   Other Topics Concern  . 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   Married    Review of Systems: As mentioned in HPI   Physical Exam: BP 127/89   Pulse 76   Temp 97.9 F (36.6 C) (Oral)   Ht 6\' 1"  (1.854 m)   Wt 245 lb (111.1 kg)   BMI 32.32 kg/m  General:   Alert and oriented. No distress noted. Pleasant and cooperative.  Head:  Normocephalic and atraumatic. Eyes:  Conjuctiva clear without scleral icterus. Mouth:  Oral mucosa pink and moist. Good dentition. No lesions. Heart:  S1, S2 present without murmurs, rubs, or gallops. Regular rate and rhythm. Abdomen:  +BS, soft, mild TTP LLQ and non-distended. No rebound  or guarding. No HSM or masses noted. Msk:  Symmetrical without gross deformities. Normal posture. Extremities:  Without edema. Neurologic:  Alert and  oriented x4;  grossly normal neurologically. Psych:  Alert and cooperative. Normal mood and affect.  Lab Results  Component Value Date   WBC 10.9 (H) 08/01/2016   HGB 17.0 08/01/2016   HCT 47.0 08/01/2016   MCV 92.3 08/01/2016   PLT 241 08/01/2016   Lab Results  Component Value Date   ALT 47 08/01/2016   AST 30 08/01/2016   ALKPHOS 109 08/01/2016   BILITOT 0.6 08/01/2016   Lab Results  Component Value Date   CREATININE 0.94 08/01/2016   BUN 11 08/01/2016   NA 137 08/01/2016   K 3.5 08/01/2016   CL 104 08/01/2016   CO2 24 08/01/2016

## 2016-08-15 NOTE — Telephone Encounter (Signed)
Pt is set up for CT on 08/31/16 @ 10:00 am

## 2016-08-15 NOTE — Patient Instructions (Signed)
I will check with radiology about the best way to order the CT. We will still plan for Friday, and we will be back with you before the day is over. Radiology was tied up with procedures, so I will have to ask a bit later today.  Start taking a probiotic daily such as Restora, Electronics engineer, [employer], Digestive Advantage, [employer] brand.  Please have blood work done.  Further recommendations to follow!

## 2016-08-15 NOTE — Telephone Encounter (Signed)
I discussed with Stanton Kidney in Radiology regarding allergy. Could pre-medicate, but as he has had hives in the past, I feel with what we are assessing, oral contrast only CT will be sufficient. Would rather avoid any risk for allergic response with IV contrast.  Please arrange CT WITHOUT IV contrast (oral only) due to LLQ pain. He requested Friday, if that works.

## 2016-08-16 ENCOUNTER — Other Ambulatory Visit: Payer: Self-pay

## 2016-08-16 ENCOUNTER — Other Ambulatory Visit: Payer: Self-pay | Admitting: Gastroenterology

## 2016-08-16 DIAGNOSIS — R1032 Left lower quadrant pain: Secondary | ICD-10-CM

## 2016-08-16 NOTE — Telephone Encounter (Signed)
Received fax from [facility]. CT abd/pelvis approved. Authorization number: [id number].

## 2016-08-17 LAB — TISSUE TRANSGLUTAMINASE, IGA: Tissue Transglutaminase Ab, IgA: 1 U/mL (ref <4)

## 2016-08-17 LAB — IGA: IgA: 158 mg/dL (ref 81–463)

## 2016-08-17 NOTE — Telephone Encounter (Signed)
Can he have this done this Friday? Wasn't sure why it was on the 26th. If pain worsens, he needs it done sooner. This has been going on several months though.

## 2016-08-17 NOTE — Assessment & Plan Note (Signed)
51 year old male with several month history of LLQ abdominal pain, more frequent stools but without diarrhea, rectal bleeding recently but none now. Last colonoscopy in 2014 with tubular adenomas. History significant for recurrent diverticulitis requiring sigmoid colectomy in 2012. Sometimes pain improved after BM, sometimes not. Not toxic-appearing today or in distress. Last CT a year ago. Recommend updated CT without IV contrast (due to history of allergy to dye), and anticipate an updated colonoscopy in near future after review of CT.

## 2016-08-17 NOTE — Progress Notes (Signed)
cc'ed to pcp °

## 2016-08-21 NOTE — Progress Notes (Signed)
REVIEWED. 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 CHEW TUMS WITH MEALS THREE TIMES A DAY. HOLD FOR CONSTIPATION. STRICTLY FOLLOW A LOW FAT DIET.

## 2016-08-21 NOTE — Telephone Encounter (Signed)
Staff message was sent on 08/17/16.

## 2016-08-22 NOTE — Progress Notes (Signed)
Will still plan on CT. Is there any way we can bump it up from the 26th?

## 2016-08-22 NOTE — Progress Notes (Signed)
Pt is aware of results. He said he still has some intermittent LLQ pain, not really any worse but not a lot better. He is aware to chew 2 tums with meals three times daily per Roseanne Kaufman, NP.  I am mailing him a low fat diet per his request.

## 2016-08-22 NOTE — Progress Notes (Signed)
Negative celiac serologies as expected. How is LLQ pain? Per Dr. Oneida Alar, needs to do this: " CHEW [place]. HOLD FOR CONSTIPATION. STRICTLY FOLLOW A LOW FAT DIET". 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

## 2016-08-22 NOTE — Progress Notes (Signed)
LMOM to call.

## 2016-08-31 ENCOUNTER — Ambulatory Visit (HOSPITAL_COMMUNITY)
Admission: RE | Admit: 2016-08-31 | Discharge: 2016-08-31 | Disposition: A | Discharge status: Home / Self Care | Payer: BLUE CROSS/BLUE SHIELD | Source: Ambulatory Visit | Attending: Gastroenterology | Admitting: Gastroenterology

## 2016-08-31 DIAGNOSIS — R1032 Left lower quadrant pain: Secondary | ICD-10-CM | POA: Insufficient documentation

## 2016-08-31 DIAGNOSIS — I700 Atherosclerosis of aorta: Secondary | ICD-10-CM | POA: Diagnosis not present

## 2016-08-31 MED ORDER — BARIUM SULFATE 2.1 % PO SUSP
ORAL | Status: AC
  Filled 2016-08-31: qty 1

## 2016-09-04 NOTE — Progress Notes (Signed)
CT normal. As discussed, let's pursue colonoscopy with Dr. Oneida Alar due to rectal bleeding. Phenergan 12.5 mg IV on call.

## 2016-09-05 NOTE — Progress Notes (Signed)
PT is aware and said he will call back this afternoon to schedule. He will call between 1-2 pm and he is aware to speak to Ginger or Tretha Sciara to schedule.

## 2016-09-28 NOTE — Progress Notes (Signed)
REVIEWED-NO ADDITIONAL RECOMMENDATIONS. 

## 2016-10-20 ENCOUNTER — Emergency Department (HOSPITAL_COMMUNITY)
Admission: EM | Admit: 2016-10-20 | Discharge: 2016-10-20 | Disposition: A | Discharge status: Home / Self Care | Payer: BLUE CROSS/BLUE SHIELD | Attending: Emergency Medicine | Admitting: Emergency Medicine

## 2016-10-20 ENCOUNTER — Encounter (HOSPITAL_COMMUNITY): Payer: Self-pay | Admitting: Emergency Medicine

## 2016-10-20 ENCOUNTER — Emergency Department (HOSPITAL_COMMUNITY): Payer: BLUE CROSS/BLUE SHIELD

## 2016-10-20 DIAGNOSIS — I10 Essential (primary) hypertension: Secondary | ICD-10-CM | POA: Insufficient documentation

## 2016-10-20 DIAGNOSIS — E039 Hypothyroidism, unspecified: Secondary | ICD-10-CM | POA: Diagnosis not present

## 2016-10-20 DIAGNOSIS — R1032 Left lower quadrant pain: Secondary | ICD-10-CM | POA: Diagnosis not present

## 2016-10-20 DIAGNOSIS — Z79899 Other long term (current) drug therapy: Secondary | ICD-10-CM | POA: Insufficient documentation

## 2016-10-20 DIAGNOSIS — I2510 Atherosclerotic heart disease of native coronary artery without angina pectoris: Secondary | ICD-10-CM | POA: Diagnosis not present

## 2016-10-20 DIAGNOSIS — R1030 Lower abdominal pain, unspecified: Secondary | ICD-10-CM

## 2016-10-20 DIAGNOSIS — F17210 Nicotine dependence, cigarettes, uncomplicated: Secondary | ICD-10-CM | POA: Insufficient documentation

## 2016-10-20 LAB — COMPREHENSIVE METABOLIC PANEL
ALT: 41 U/L (ref 17–63)
AST: 27 U/L (ref 15–41)
Albumin: 4.2 g/dL (ref 3.5–5.0)
Alkaline Phosphatase: 108 U/L (ref 38–126)
Anion gap: 10 (ref 5–15)
BUN: 12 mg/dL (ref 6–20)
CO2: 26 mmol/L (ref 22–32)
Calcium: 9 mg/dL (ref 8.9–10.3)
Chloride: 104 mmol/L (ref 101–111)
Creatinine, Ser: 1.06 mg/dL (ref 0.61–1.24)
GFR calc Af Amer: >60 mL/min (ref >60)
GFR calc non Af Amer: >60 mL/min (ref >60)
Glucose, Bld: 117 mg/dL — ABNORMAL HIGH (ref 65–99)
Potassium: 3.4 mmol/L — ABNORMAL LOW (ref 3.5–5.1)
Sodium: 140 mmol/L (ref 135–145)
Total Bilirubin: 0.5 mg/dL (ref 0.3–1.2)
Total Protein: 7.2 g/dL (ref 6.5–8.1)

## 2016-10-20 LAB — CBC WITH DIFFERENTIAL/PLATELET
Basophils Absolute: 0.1 10*3/uL (ref 0.0–0.1)
Basophils Relative: 1 %
Eosinophils Absolute: 0.4 10*3/uL (ref 0.0–0.7)
Eosinophils Relative: 4 %
HCT: 46.2 % (ref 39.0–52.0)
Hemoglobin: 16.6 g/dL (ref 13.0–17.0)
Lymphocytes Relative: 28 %
Lymphs Abs: 2.8 10*3/uL (ref 0.7–4.0)
MCH: 33.1 pg (ref 26.0–34.0)
MCHC: 35.9 g/dL (ref 30.0–36.0)
MCV: 92 fL (ref 78.0–100.0)
Monocytes Absolute: 0.6 10*3/uL (ref 0.1–1.0)
Monocytes Relative: 6 %
Neutro Abs: 6.3 10*3/uL (ref 1.7–7.7)
Neutrophils Relative %: 61 %
Platelets: 230 10*3/uL (ref 150–400)
RBC: 5.02 MIL/uL (ref 4.22–5.81)
RDW: 12.2 % (ref 11.5–15.5)
WBC: 10.2 10*3/uL (ref 4.0–10.5)

## 2016-10-20 MED ORDER — SODIUM CHLORIDE 0.9 % IV BOLUS (SEPSIS)
2000.0000 mL | Freq: Once | INTRAVENOUS | Status: AC
  Administered 2016-10-20: 1000 mL via INTRAVENOUS

## 2016-10-20 MED ORDER — ONDANSETRON HCL 4 MG/2ML IJ SOLN
4.0000 mg | Freq: Once | INTRAMUSCULAR | Status: AC
  Administered 2016-10-20: 4 mg via INTRAVENOUS
  Filled 2016-10-20: qty 2

## 2016-10-20 MED ORDER — HYDROMORPHONE HCL 1 MG/ML IJ SOLN
1.0000 mg | Freq: Once | INTRAMUSCULAR | Status: AC
  Administered 2016-10-20: 1 mg via INTRAVENOUS
  Filled 2016-10-20: qty 1

## 2016-10-20 MED ORDER — HYDROCODONE-ACETAMINOPHEN 5-325 MG PO TABS: 1.0000 | ORAL_TABLET | Freq: Four times a day (QID) | ORAL | 0 refills | Status: DC | PRN

## 2016-10-20 MED ORDER — CIPROFLOXACIN HCL 500 MG PO TABS: 500.0000 mg | ORAL_TABLET | Freq: Two times a day (BID) | ORAL | 0 refills | Status: DC

## 2016-10-20 MED ORDER — ONDANSETRON 4 MG PO TBDP: ORAL_TABLET | ORAL | 0 refills | Status: DC

## 2016-10-20 MED ORDER — METRONIDAZOLE 500 MG PO TABS: 500.0000 mg | ORAL_TABLET | Freq: Three times a day (TID) | ORAL | 0 refills | Status: DC

## 2016-10-20 MED ORDER — MECLIZINE HCL 25 MG PO TABS: 25.0000 mg | ORAL_TABLET | Freq: Three times a day (TID) | ORAL | 0 refills | Status: DC | PRN

## 2016-10-20 NOTE — ED Triage Notes (Signed)
Pt c/o left lower abd pain with n/d for a few days and c/o bilateral ear pain and dizziness.

## 2016-10-20 NOTE — ED Notes (Signed)
Pt ambulatory to waiting room. Pt verbalized understanding of discharge instructions.   

## 2016-10-20 NOTE — ED Notes (Addendum)
Patient transported to CT 

## 2016-10-20 NOTE — Discharge Instructions (Signed)
Follow up with your md next week.  Drink plenty of fluids °

## 2016-10-22 NOTE — ED Provider Notes (Signed)
[place] [facility] DEPT Provider Note   CSN: [id number] Arrival date & time: 10/20/16  2143     History   Chief Complaint Chief Complaint  Patient presents with  . Abdominal Pain    HPI Andrew Love is a 51 y.o. male.  Patient complains of left lower quadrant abdominal pain   The history is provided by the patient.  Abdominal Pain   This is a new problem. The current episode started yesterday. The problem occurs constantly. The problem has not changed since onset.The pain is associated with an unknown factor. The pain is located in the LLQ. The pain is at a severity of 7/10. The pain is moderate. Pertinent negatives include diarrhea, flatus, frequency, hematuria and headaches.    Past Medical History:  Diagnosis Date  . Arthritis   . Back pain    chronic  . Bronchitis    history of  . CAD (coronary artery disease)   . Chronic back pain   . Chronic insomnia    Per medical history form dated 06/13/10.  . Chronic left hip pain   . Colitis    Per medical history from dated 06/13/10.  . Diverticulitis    Hx of; requiring 3 admissions  . Elevated liver enzymes   . Hypertension   . Hypothyroidism   . Left leg pain    chronic  . 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 Psoriasis    Per medical history form dated 06/13/10.  . Sigmoid colon ulcer    Rectal polyps  . Sleep apnea with use of continuous positive airway pressure (CPAP)     Patient Active Problem List   Diagnosis Date Noted  . LUQ pain 06/19/2016  . Symptomatic cholelithiasis 08/30/2015  . Preoperative cardiovascular examination   . Coronary artery disease involving native coronary artery of native heart without angina pectoris   . Gastroenteritis 08/26/2015  . Cholelithiases 08/24/2015  . Abdominal pain 08/24/2015  . Cholecystitis, acute 08/24/2015  . Diarrhea 08/24/2015  . Acute cholecystitis 08/24/2015  . Nausea without vomiting 04/08/2015  . Diverticulitis of colon   .  Cerebral thrombosis with cerebral infarction ([place]) 03/21/2014  . Stroke ([place]) 03/21/2014  . Essential hypertension 03/21/2014  . TIA (transient ischemic attack)   . HLD (hyperlipidemia)   . Thyroid activity decreased   . Colitis 11/20/2012  . CAD (coronary artery disease)   . Hip pain 08/04/2011  . 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 LIVER FUNCTION TESTS, ABNORMAL, HX OF 12/17/2008    Past Surgical History:  Procedure Laterality Date  . APPENDECTOMY     8th grade  . BACK SURGERY    . CARDIAC CATHETERIZATION  2009  . CARDIAC CATHETERIZATION     Per medical history from dated 06/13/10.  . CERVICAL SPINE SURGERY     C4, C5, C6 spinal fusion  . CHOLECYSTECTOMY N/A 08/31/2015   Procedure: LAPAROSCOPIC CHOLECYSTECTOMY WITH INTRAOPERATIVE CHOLANGIOGRAM;  Surgeon: Excell Seltzer, MD;  Location: [facility];  Service: General;  Laterality: N/A;  . COLONOSCOPY  07/2008   Colitis,NSAID v. Ischemia,malrotation of the gut,Diverticulosis(L),hyperplastic  . COLONOSCOPY N/A 12/13/2012   Dr. Oneida Alar: Normal TI, mild sigmoid diverticulosis, hemorrhoids, 2 polyps (tubular adenoma). Random colon bx negative. Next TCS 12/2022 with Fentanyl/phenergan  . ESOPHAGOGASTRODUODENOSCOPY N/A 04/16/2014   RMR: Erosive reflux esophagitis. Non critical Schzki's ring not manipulated. Small hiatal hernia. Abnormal gastirc mucosa of doubtful signigicance status post biopsy. I suspect trivial upper GI bleed. Recent abdominal pain presumably secondary  to a protracted bout of diverticulitis. CT scan November 23 revealed improvemetn without complication. He finished his antibiotics 2 days age. Left sided ab  . JOINT REPLACEMENT    . LAPAROSCOPIC SIGMOID COLECTOMY  2012   Dr. Fanny Skates: recurrent sigmoid diverticulitis  . LAPAROSCOPIC SIGMOID COLECTOMY  2012   recurernt sigmoid diverticulitis, Dr. Dalbert Batman   . ROTATOR CUFF REPAIR     Left - per medical history form dated 06/13/10.  Marland Kitchen SHOULDER SURGERY      Left  . THYROIDECTOMY     Per medical history form dated 06/13/10.  Marland Kitchen TOTAL HIP ARTHROPLASTY  07/04/2011   Procedure: TOTAL HIP ARTHROPLASTY;  Surgeon: Johnny Bridge, MD;  Location: [facility];  Service: Orthopedics;  Laterality: Right;       Home Medications    Prior to Admission medications   Medication Sig Start Date End Date Taking? Authorizing Provider  acetaminophen (TYLENOL) 500 MG tablet Take 1,000 mg by mouth every 6 (six) hours as needed for mild pain, moderate pain, fever or headache.   Yes [provider]  diphenhydrAMINE (BENADRYL) 25 MG tablet Take 50 mg by mouth every 6 (six) hours as needed for allergies.    Yes [provider]  EPINEPHrine (EPIPEN) 0.3 mg/0.3 mL SOAJ Inject 0.3 mLs (0.3 mg total) into the muscle as needed. Patient taking differently: Inject 0.3 mg into the muscle daily as needed (allergic reaction).  12/01/12  Yes Teressa Lower, MD  famotidine (PEPCID) 20 MG tablet Take 1 tablet (20 mg total) by mouth 2 (two) times daily. 07/05/16  Yes Rancour, Annie Main, MD  hydrocortisone cream 1 % Apply 1 application topically once as needed for itching.   Yes [provider]  levothyroxine (SYNTHROID, LEVOTHROID) 300 MCG tablet Take 300 mcg by mouth daily. 03/26/14  Yes [provider]  olmesartan (BENICAR) 20 MG tablet Take 20 mg by mouth daily.    Yes [provider]  pantoprazole (PROTONIX) 40 MG tablet Take 1 tablet (40 mg total) by mouth daily. 10/19/15  Yes Carlis Stable, NP  zolpidem (AMBIEN) 10 MG tablet Take 10 mg by mouth at bedtime.    Yes [provider]  ciprofloxacin (CIPRO) 500 MG tablet Take 1 tablet (500 mg total) by mouth 2 (two) times daily. One po bid x 7 days 10/20/16   Milton Ferguson, MD  HYDROcodone-acetaminophen (NORCO/VICODIN) 5-325 MG tablet Take 1 tablet by mouth every 6 (six) hours as needed for moderate pain. 10/20/16   Milton Ferguson, MD  meclizine (ANTIVERT) 25 MG tablet Take 1 tablet (25 mg  total) by mouth 3 (three) times daily as needed for dizziness. 10/20/16   Milton Ferguson, MD  metroNIDAZOLE (FLAGYL) 500 MG tablet Take 1 tablet (500 mg total) by mouth 3 (three) times daily. One po bid x 7 days 10/20/16   Milton Ferguson, MD  ondansetron (ZOFRAN ODT) 4 MG disintegrating tablet 4mg  ODT q4 hours prn nausea/vomit 10/20/16   Milton Ferguson, MD    Family History Family History  Problem Relation Age of Onset  . Cancer Mother        breast cancer - per medical history form dated 06/13/10.  . Diverticulitis Mother   . Hypertension Mother   . Cancer Father        skin - per medical history form dated 06/13/10.  Marland Kitchen Hypertension Father   . Anesthesia problems Neg Hx   . Hypotension Neg Hx   . Malignant hyperthermia Neg Hx   . Pseudochol  deficiency Neg Hx   . Colon cancer Neg Hx     Social History Social History  Substance Use Topics  . Smoking status: Current Every Day Smoker    Packs/day: 0.50    Years: 18.00    Types: Cigarettes  . Smokeless tobacco: Never Used     Comment:    . Alcohol use No     Allergies   Other; Iohexol; Shrimp [shellfish allergy]; Wheat bran; and Yeast-related products   Review of Systems Review of Systems  Constitutional: Negative for appetite change and fatigue.  HENT: Negative for congestion, ear discharge and sinus pressure.   Eyes: Negative for discharge.  Respiratory: Negative for cough.   Cardiovascular: Negative for chest pain.  Gastrointestinal: Positive for abdominal pain. Negative for diarrhea and flatus.  Genitourinary: Negative for frequency and hematuria.  Musculoskeletal: Negative for back pain.  Skin: Negative for rash.  Neurological: Negative for seizures and headaches.  Psychiatric/Behavioral: Negative for hallucinations.     Physical Exam Updated Vital Signs BP 140/76 (BP Location: Right Arm)   Pulse 60   Temp 98 F (36.7 C)   Resp 17   Ht 6\' 1"  (1.854 m)   Wt 107.5 kg (237 lb)   SpO2 96%   BMI 31.27 kg/m    Physical Exam  Constitutional: He is oriented to person, place, and time. He appears well-developed.  HENT:  Head: Normocephalic.  Eyes: Conjunctivae and EOM are normal. No scleral icterus.  Neck: Neck supple. No thyromegaly present.  Cardiovascular: Normal rate and regular rhythm.  Exam reveals no gallop and no friction rub.   No murmur heard. Pulmonary/Chest: No stridor. He has no wheezes. He has no rales. He exhibits no tenderness.  Abdominal: He exhibits no distension. There is tenderness. There is no rebound.  Tender llq  Musculoskeletal: Normal range of motion. He exhibits no edema.  Lymphadenopathy:    He has no cervical adenopathy.  Neurological: He is oriented to person, place, and time. He exhibits normal muscle tone. Coordination normal.  Skin: No rash noted. No erythema.  Psychiatric: He has a normal mood and affect. His behavior is normal.     ED Treatments / Results  Labs (all labs ordered are listed, but only abnormal results are displayed) Labs Reviewed  COMPREHENSIVE METABOLIC PANEL - Abnormal; Notable for the following:       Result Value   Potassium 3.4 (*)    Glucose, Bld 117 (*)    All other components within normal limits  CBC WITH DIFFERENTIAL/PLATELET    EKG  EKG Interpretation None       Radiology Ct Abdomen Pelvis Wo Contrast  Result Date: 10/20/2016 CLINICAL DATA:  Left lower abdominal pain with nausea and diarrhea EXAM: CT ABDOMEN AND PELVIS WITHOUT CONTRAST TECHNIQUE: Multidetector CT imaging of the abdomen and pelvis was performed following the standard protocol without IV contrast. COMPARISON:  08/24/2015, 08/31/2016 FINDINGS: Lower chest: Lung bases demonstrate no acute consolidation or pleural effusion. The heart is nonenlarged. Hepatobiliary: No focal liver abnormality is seen. Status post cholecystectomy. No biliary dilatation. Pancreas: Unremarkable. No pancreatic ductal dilatation or surrounding inflammatory changes. Spleen: Normal in  size without focal abnormality. Adrenals/Urinary Tract: Adrenal glands are within normal limits. Kidneys show no hydronephrosis. The bladder is unremarkable Stomach/Bowel: The stomach is nonenlarged. Debris in the stomach. No dilated small bowel. Transverse lie of the cecum which is visualized in the left hemiabdomen as is the terminal ileum. Nonvisualized appendix consistent with history of appendectomy. Sigmoid colon diverticula  without definite acute inflammatory change. Vascular/Lymphatic: Aortic atherosclerosis. No enlarged abdominal or pelvic lymph nodes. Reproductive: Prostate is unremarkable. Other: No free air or free fluid dx.  Small fat in the umbilicus. Musculoskeletal: No acute or suspicious bone lesion. Status post right hip replacement with associated artifact. IMPRESSION: 1. Negative for bowel obstruction or bowel wall thickening 2. Sigmoid colon diverticular disease without acute inflammation Electronically Signed   By: Donavan Foil M.D.   On: 10/20/2016 23:18    Procedures Procedures (including critical care time)  Medications Ordered in ED Medications  sodium chloride 0.9 % bolus 2,000 mL (0 mLs Intravenous Stopped 10/20/16 2334)  ondansetron (ZOFRAN) injection 4 mg (4 mg Intravenous Given 10/20/16 2222)  HYDROmorphone (DILAUDID) injection 1 mg (1 mg Intravenous Given 10/20/16 2222)  HYDROmorphone (DILAUDID) injection 1 mg (1 mg Intravenous Given 10/20/16 2322)  ondansetron (ZOFRAN) injection 4 mg (4 mg Intravenous Given 10/20/16 2322)     Initial Impression / Assessment and Plan / ED Course  I have reviewed the triage vital signs and the nursing notes.  Pertinent labs & imaging results that were available during my care of the patient were reviewed by me and considered in my medical decision making (see chart for details).     Pt with llq pain.  Will Empirically treat with flagyl and cipro and will follow up this week  Final Clinical Impressions(s) / ED Diagnoses   Final  diagnoses:  Lower abdominal pain    New Prescriptions Discharge Medication List as of 10/20/2016 11:12 PM    START taking these medications   Details  ciprofloxacin (CIPRO) 500 MG tablet Take 1 tablet (500 mg total) by mouth 2 (two) times daily. One po bid x 7 days, Starting Fri 10/20/2016, Print    HYDROcodone-acetaminophen (NORCO/VICODIN) 5-325 MG tablet Take 1 tablet by mouth every 6 (six) hours as needed for moderate pain., Starting Fri 10/20/2016, Print    meclizine (ANTIVERT) 25 MG tablet Take 1 tablet (25 mg total) by mouth 3 (three) times daily as needed for dizziness., Starting Fri 10/20/2016, Print    metroNIDAZOLE (FLAGYL) 500 MG tablet Take 1 tablet (500 mg total) by mouth 3 (three) times daily. One po bid x 7 days, Starting Fri 10/20/2016, Print         Milton Ferguson, MD 10/22/16 1523

## 2016-10-31 ENCOUNTER — Other Ambulatory Visit: Payer: Self-pay | Admitting: Nurse Practitioner

## 2016-11-13 ENCOUNTER — Encounter (HOSPITAL_COMMUNITY): Payer: Self-pay | Admitting: Emergency Medicine

## 2016-11-13 ENCOUNTER — Emergency Department (HOSPITAL_COMMUNITY)
Admission: EM | Admit: 2016-11-13 | Discharge: 2016-11-13 | Disposition: A | Discharge status: Home / Self Care | Payer: BLUE CROSS/BLUE SHIELD | Attending: Emergency Medicine | Admitting: Emergency Medicine

## 2016-11-13 DIAGNOSIS — G8929 Other chronic pain: Secondary | ICD-10-CM | POA: Insufficient documentation

## 2016-11-13 DIAGNOSIS — Z79899 Other long term (current) drug therapy: Secondary | ICD-10-CM | POA: Insufficient documentation

## 2016-11-13 DIAGNOSIS — F17210 Nicotine dependence, cigarettes, uncomplicated: Secondary | ICD-10-CM | POA: Insufficient documentation

## 2016-11-13 DIAGNOSIS — M79605 Pain in left leg: Secondary | ICD-10-CM | POA: Insufficient documentation

## 2016-11-13 DIAGNOSIS — E039 Hypothyroidism, unspecified: Secondary | ICD-10-CM | POA: Insufficient documentation

## 2016-11-13 DIAGNOSIS — I10 Essential (primary) hypertension: Secondary | ICD-10-CM | POA: Insufficient documentation

## 2016-11-13 LAB — CBC WITH DIFFERENTIAL/PLATELET
Basophils Absolute: 0.1 10*3/uL (ref 0.0–0.1)
Basophils Relative: 1 %
Eosinophils Absolute: 0.4 10*3/uL (ref 0.0–0.7)
Eosinophils Relative: 4 %
HCT: 46.8 % (ref 39.0–52.0)
Hemoglobin: 16.8 g/dL (ref 13.0–17.0)
Lymphocytes Relative: 27 %
Lymphs Abs: 3 10*3/uL (ref 0.7–4.0)
MCH: 32.7 pg (ref 26.0–34.0)
MCHC: 35.9 g/dL (ref 30.0–36.0)
MCV: 91.2 fL (ref 78.0–100.0)
Monocytes Absolute: 0.7 10*3/uL (ref 0.1–1.0)
Monocytes Relative: 6 %
Neutro Abs: 6.9 10*3/uL (ref 1.7–7.7)
Neutrophils Relative %: 62 %
Platelets: 226 10*3/uL (ref 150–400)
RBC: 5.13 MIL/uL (ref 4.22–5.81)
RDW: 12.4 % (ref 11.5–15.5)
WBC: 11 10*3/uL — ABNORMAL HIGH (ref 4.0–10.5)

## 2016-11-13 LAB — BASIC METABOLIC PANEL
Anion gap: 10 (ref 5–15)
BUN: 12 mg/dL (ref 6–20)
CO2: 25 mmol/L (ref 22–32)
Calcium: 9.2 mg/dL (ref 8.9–10.3)
Chloride: 104 mmol/L (ref 101–111)
Creatinine, Ser: 1.06 mg/dL (ref 0.61–1.24)
GFR calc Af Amer: >60 mL/min (ref >60)
GFR calc non Af Amer: >60 mL/min (ref >60)
Glucose, Bld: 90 mg/dL (ref 65–99)
Potassium: 3.7 mmol/L (ref 3.5–5.1)
Sodium: 139 mmol/L (ref 135–145)

## 2016-11-13 LAB — D-DIMER, QUANTITATIVE: D-Dimer, Quant: <0.27 ug/mL-FEU (ref 0.00–0.50)

## 2016-11-13 MED ORDER — KETOROLAC TROMETHAMINE 30 MG/ML IJ SOLN
15.0000 mg | Freq: Once | INTRAMUSCULAR | Status: AC
  Administered 2016-11-13: 15 mg via INTRAVENOUS
  Filled 2016-11-13: qty 1

## 2016-11-13 MED ORDER — SODIUM CHLORIDE 0.9 % IV BOLUS (SEPSIS)
1000.0000 mL | Freq: Once | INTRAVENOUS | Status: AC
  Administered 2016-11-13: 1000 mL via INTRAVENOUS

## 2016-11-13 NOTE — ED Triage Notes (Signed)
L calf pain awoke pt from sleep last night with intermittent cramping, denies injury. Cramping has been intermittent all day, eases with standing. Denies injury, hx of DVT, or new activities.

## 2016-11-13 NOTE — ED Provider Notes (Signed)
[facility] DEPT Provider Note   CSN: [id number] Arrival date & time: 11/13/16  1843     History   Chief Complaint Chief Complaint  Patient presents with  . Leg Pain    left    HPI Andrew Love is a 51 y.o. male.  HPI  Patient presents with concern of left lower extremity pain. Onset was yesterday, without any clear precipitant. Since onset he has had intermittent episodes of severe, sharp, burning pain from the popliteal fossa to the mid posterior left lower leg. No relief with anything, no distal loss of sensation or weakness, no swelling, no chest pain, no dyspnea. Patient acknowledges history of multiple medical issues, denies a history of hematologic disorder or thrombotic disorder. He is here with his wife who assists with the history of present illness.  Past Medical History:  Diagnosis Date  . Arthritis   . Back pain    chronic  . Bronchitis    history of  . CAD (coronary artery disease)   . Chronic back pain   . Chronic insomnia    Per medical history form dated 06/13/10.  . Chronic left hip pain   . Colitis    Per medical history from dated 06/13/10.  . Diverticulitis    Hx of; requiring 3 admissions  . Elevated liver enzymes   . Hypertension   . Hypothyroidism   . Left leg pain    chronic  . 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 Psoriasis    Per medical history form dated 06/13/10.  . Sigmoid colon ulcer    Rectal polyps  . Sleep apnea with use of continuous positive airway pressure (CPAP)     Patient Active Problem List   Diagnosis Date Noted  . LUQ pain 06/19/2016  . Symptomatic cholelithiasis 08/30/2015  . Preoperative cardiovascular examination   . Coronary artery disease involving native coronary artery of native heart without angina pectoris   . Gastroenteritis 08/26/2015  . Cholelithiases 08/24/2015  . Abdominal pain 08/24/2015  . Cholecystitis, acute 08/24/2015  . Diarrhea 08/24/2015  . Acute cholecystitis  08/24/2015  . Nausea without vomiting 04/08/2015  . Diverticulitis of colon   . Cerebral thrombosis with cerebral infarction ([place]) 03/21/2014  . Stroke ([place]) 03/21/2014  . Essential hypertension 03/21/2014  . TIA (transient ischemic attack)   . HLD (hyperlipidemia)   . Thyroid activity decreased   . Colitis 11/20/2012  . CAD (coronary artery disease)   . Hip pain 08/04/2011  . 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 LIVER FUNCTION TESTS, ABNORMAL, HX OF 12/17/2008    Past Surgical History:  Procedure Laterality Date  . APPENDECTOMY     8th grade  . BACK SURGERY    . CARDIAC CATHETERIZATION  2009  . CARDIAC CATHETERIZATION     Per medical history from dated 06/13/10.  . CERVICAL SPINE SURGERY     C4, C5, C6 spinal fusion  . CHOLECYSTECTOMY N/A 08/31/2015   Procedure: LAPAROSCOPIC CHOLECYSTECTOMY WITH INTRAOPERATIVE CHOLANGIOGRAM;  Surgeon: Excell Seltzer, MD;  Location: [facility];  Service: General;  Laterality: N/A;  . COLONOSCOPY  07/2008   Colitis,NSAID v. Ischemia,malrotation of the gut,Diverticulosis(L),hyperplastic  . COLONOSCOPY N/A 12/13/2012   Dr. Oneida Alar: Normal TI, mild sigmoid diverticulosis, hemorrhoids, 2 polyps (tubular adenoma). Random colon bx negative. Next TCS 12/2022 with Fentanyl/phenergan  . ESOPHAGOGASTRODUODENOSCOPY N/A 04/16/2014   RMR: Erosive reflux esophagitis. Non critical Schzki's ring not manipulated. Small hiatal hernia. Abnormal gastirc mucosa of doubtful signigicance status  post biopsy. I suspect trivial upper GI bleed. Recent abdominal pain presumably secondary to a protracted bout of diverticulitis. CT scan November 23 revealed improvemetn without complication. He finished his antibiotics 2 days age. Left sided ab  . JOINT REPLACEMENT    . LAPAROSCOPIC SIGMOID COLECTOMY  2012   Dr. Fanny Skates: recurrent sigmoid diverticulitis  . LAPAROSCOPIC SIGMOID COLECTOMY  2012   recurernt sigmoid diverticulitis, Dr. Dalbert Batman   . ROTATOR  CUFF REPAIR     Left - per medical history form dated 06/13/10.  Marland Kitchen SHOULDER SURGERY     Left  . THYROIDECTOMY     Per medical history form dated 06/13/10.  Marland Kitchen TOTAL HIP ARTHROPLASTY  07/04/2011   Procedure: TOTAL HIP ARTHROPLASTY;  Surgeon: Johnny Bridge, MD;  Location: [facility];  Service: Orthopedics;  Laterality: Right;       Home Medications    Prior to Admission medications   Medication Sig Start Date End Date Taking? Authorizing Provider  acetaminophen (TYLENOL) 500 MG tablet Take 1,000 mg by mouth every 6 (six) hours as needed for mild pain, moderate pain, fever or headache.    [provider]  ciprofloxacin (CIPRO) 500 MG tablet Take 1 tablet (500 mg total) by mouth 2 (two) times daily. One po bid x 7 days 10/20/16   Milton Ferguson, MD  diphenhydrAMINE (BENADRYL) 25 MG tablet Take 50 mg by mouth every 6 (six) hours as needed for allergies.     [provider]  EPINEPHrine (EPIPEN) 0.3 mg/0.3 mL SOAJ Inject 0.3 mLs (0.3 mg total) into the muscle as needed. Patient taking differently: Inject 0.3 mg into the muscle daily as needed (allergic reaction).  12/01/12   Teressa Lower, MD  famotidine (PEPCID) 20 MG tablet Take 1 tablet (20 mg total) by mouth 2 (two) times daily. 07/05/16   Rancour, Annie Main, MD  HYDROcodone-acetaminophen (NORCO/VICODIN) 5-325 MG tablet Take 1 tablet by mouth every 6 (six) hours as needed for moderate pain. 10/20/16   Milton Ferguson, MD  hydrocortisone cream 1 % Apply 1 application topically once as needed for itching.    [provider]  levothyroxine (SYNTHROID, LEVOTHROID) 300 MCG tablet Take 300 mcg by mouth daily. 03/26/14   [provider]  meclizine (ANTIVERT) 25 MG tablet Take 1 tablet (25 mg total) by mouth 3 (three) times daily as needed for dizziness. 10/20/16   Milton Ferguson, MD  metroNIDAZOLE (FLAGYL) 500 MG tablet Take 1 tablet (500 mg total) by mouth 3 (three) times daily. One po bid x 7 days 10/20/16   Milton Ferguson, MD    olmesartan (BENICAR) 20 MG tablet Take 20 mg by mouth daily.     [provider]  ondansetron (ZOFRAN ODT) 4 MG disintegrating tablet 4mg  ODT q4 hours prn nausea/vomit 10/20/16   Milton Ferguson, MD  pantoprazole (PROTONIX) 40 MG tablet TAKE ONE TABLET BY MOUTH ONCE DAILY 10/31/16   Annitta Needs, NP  zolpidem (AMBIEN) 10 MG tablet Take 10 mg by mouth at bedtime.     [provider]    Family History Family History  Problem Relation Age of Onset  . Cancer Mother        breast cancer - per medical history form dated 06/13/10.  . Diverticulitis Mother   . Hypertension Mother   . Cancer Father        skin - per medical history form dated 06/13/10.  Marland Kitchen Hypertension Father   . Anesthesia problems Neg Hx   . Hypotension Neg  Hx   . Malignant hyperthermia Neg Hx   . Pseudochol deficiency Neg Hx   . Colon cancer Neg Hx     Social History Social History  Substance Use Topics  . Smoking status: Current Every Day Smoker    Packs/day: 0.50    Years: 18.00    Types: Cigarettes  . Smokeless tobacco: Never Used     Comment:    . Alcohol use No     Allergies   Other; Iohexol; Shrimp [shellfish allergy]; Wheat bran; and Yeast-related products   Review of Systems Review of Systems  Constitutional:       Per HPI, otherwise negative  HENT:       Per HPI, otherwise negative  Respiratory:       Per HPI, otherwise negative  Cardiovascular:       Per HPI, otherwise negative  Gastrointestinal: Negative for vomiting.  Endocrine:       Negative aside from HPI  Genitourinary:       Neg aside from HPI   Musculoskeletal:       Per HPI, otherwise negative  Skin: Negative.   Neurological: Negative for syncope.     Physical Exam Updated Vital Signs BP 122/82   Pulse 66   Temp 98.1 F (36.7 C) (Oral)   Resp 18   Ht 6\' 1"  (1.854 m)   Wt 107.5 kg (237 lb)   SpO2 95%   BMI 31.27 kg/m   Physical Exam  Constitutional: He is oriented to person, place, and time. He  appears well-developed. No distress.  HENT:  Head: Normocephalic and atraumatic.  Eyes: Conjunctivae and EOM are normal.  Cardiovascular: Normal rate, regular rhythm and intact distal pulses.   Pulmonary/Chest: Effort normal. No stridor. No respiratory distress.  Abdominal: He exhibits no distension.  Musculoskeletal: He exhibits no edema.  No appreciable deformity of either lower extremity, and the patient has preserved flexion, extension of the ankle, knee.  Neurological: He is alert and oriented to person, place, and time.  Skin: Skin is warm and dry.  Psychiatric: He has a normal mood and affect.  Nursing note and vitals reviewed.    ED Treatments / Results  Labs (all labs ordered are listed, but only abnormal results are displayed) Labs Reviewed  CBC WITH DIFFERENTIAL/PLATELET - Abnormal; Notable for the following:       Result Value   WBC 11.0 (*)    All other components within normal limits  D-DIMER, QUANTITATIVE (NOT AT [facility])  BASIC METABOLIC PANEL     Procedures Procedures (including critical care time)  Medications Ordered in ED Medications  sodium chloride 0.9 % bolus 1,000 mL (1,000 mLs Intravenous New Bag/Given 11/13/16 1956)  ketorolac (TORADOL) 30 MG/ML injection 15 mg (15 mg Intravenous Given 11/13/16 1956)     Initial Impression / Assessment and Plan / ED Course  I have reviewed the triage vital signs and the nursing notes.  Pertinent labs & imaging results that were available during my care of the patient were reviewed by me and considered in my medical decision making (see chart for details).  8:20 PM Patient awake and alert.  He has had no additional episodes of pain since receiving Toradol, fluids. I discussed all findings with him and his wife, who is a paramedic. We discussed the reassuring d-dimer result, absence of evidence for DVT, and given the improvement in pain, absence of distal neurovascular compromise, no evidence for systemic illness, the  patient is appropriate for discharge with  outpatient follow-up.  Final Clinical Impressions(s) / ED Diagnoses  Lower extremity pain, left, initial encounter   Carmin Muskrat, MD 11/13/16 2021

## 2016-11-13 NOTE — Discharge Instructions (Signed)
As discussed, your evaluation today has been largely reassuring.  But, it is important that you monitor your condition carefully, and do not hesitate to return to the ED if you develop new, or concerning changes in your condition. ? ?Otherwise, please follow-up with your physician for appropriate ongoing care. ? ?

## 2017-04-09 ENCOUNTER — Emergency Department (HOSPITAL_COMMUNITY): Payer: Commercial Managed Care - PPO

## 2017-04-09 ENCOUNTER — Emergency Department (HOSPITAL_COMMUNITY)
Admission: EM | Admit: 2017-04-09 | Discharge: 2017-04-09 | Disposition: A | Discharge status: Home / Self Care | Payer: Commercial Managed Care - PPO | Attending: Emergency Medicine | Admitting: Emergency Medicine

## 2017-04-09 ENCOUNTER — Encounter (HOSPITAL_COMMUNITY): Payer: Self-pay | Admitting: Cardiology

## 2017-04-09 DIAGNOSIS — H81399 Other peripheral vertigo, unspecified ear: Secondary | ICD-10-CM | POA: Insufficient documentation

## 2017-04-09 DIAGNOSIS — R51 Headache: Secondary | ICD-10-CM | POA: Insufficient documentation

## 2017-04-09 DIAGNOSIS — F17210 Nicotine dependence, cigarettes, uncomplicated: Secondary | ICD-10-CM | POA: Insufficient documentation

## 2017-04-09 DIAGNOSIS — E039 Hypothyroidism, unspecified: Secondary | ICD-10-CM | POA: Diagnosis not present

## 2017-04-09 DIAGNOSIS — I259 Chronic ischemic heart disease, unspecified: Secondary | ICD-10-CM | POA: Insufficient documentation

## 2017-04-09 DIAGNOSIS — Z79899 Other long term (current) drug therapy: Secondary | ICD-10-CM | POA: Insufficient documentation

## 2017-04-09 DIAGNOSIS — I10 Essential (primary) hypertension: Secondary | ICD-10-CM | POA: Insufficient documentation

## 2017-04-09 DIAGNOSIS — R42 Dizziness and giddiness: Secondary | ICD-10-CM | POA: Diagnosis present

## 2017-04-09 LAB — CBC
HCT: 47 % (ref 39.0–52.0)
Hemoglobin: 16 g/dL (ref 13.0–17.0)
MCH: 32.3 pg (ref 26.0–34.0)
MCHC: 34 g/dL (ref 30.0–36.0)
MCV: 94.9 fL (ref 78.0–100.0)
Platelets: 215 10*3/uL (ref 150–400)
RBC: 4.95 MIL/uL (ref 4.22–5.81)
RDW: 12.7 % (ref 11.5–15.5)
WBC: 8 10*3/uL (ref 4.0–10.5)

## 2017-04-09 LAB — URINALYSIS, ROUTINE W REFLEX MICROSCOPIC
Bilirubin Urine: NEGATIVE
Glucose, UA: NEGATIVE mg/dL
Hgb urine dipstick: NEGATIVE
Ketones, ur: NEGATIVE mg/dL
Leukocytes, UA: NEGATIVE
Nitrite: NEGATIVE
Protein, ur: NEGATIVE mg/dL
Specific Gravity, Urine: 1.004 — ABNORMAL LOW (ref 1.005–1.030)
pH: 7 (ref 5.0–8.0)

## 2017-04-09 LAB — TROPONIN I
Troponin I: <0.03 ng/mL (ref <0.03)
Troponin I: <0.03 ng/mL (ref <0.03)

## 2017-04-09 LAB — BASIC METABOLIC PANEL
Anion gap: 8 (ref 5–15)
BUN: 10 mg/dL (ref 6–20)
CO2: 24 mmol/L (ref 22–32)
Calcium: 9 mg/dL (ref 8.9–10.3)
Chloride: 106 mmol/L (ref 101–111)
Creatinine, Ser: 0.95 mg/dL (ref 0.61–1.24)
GFR calc Af Amer: >60 mL/min (ref >60)
GFR calc non Af Amer: >60 mL/min (ref >60)
Glucose, Bld: 114 mg/dL — ABNORMAL HIGH (ref 65–99)
Potassium: 3.5 mmol/L (ref 3.5–5.1)
Sodium: 138 mmol/L (ref 135–145)

## 2017-04-09 MED ORDER — ONDANSETRON HCL 4 MG/2ML IJ SOLN
4.0000 mg | Freq: Once | INTRAMUSCULAR | Status: AC
  Administered 2017-04-09: 4 mg via INTRAVENOUS
  Filled 2017-04-09: qty 2

## 2017-04-09 MED ORDER — MECLIZINE HCL 12.5 MG PO TABS
25.0000 mg | ORAL_TABLET | Freq: Once | ORAL | Status: AC
  Administered 2017-04-09: 25 mg via ORAL
  Filled 2017-04-09: qty 2

## 2017-04-09 MED ORDER — MECLIZINE HCL 25 MG PO TABS: 25.0000 mg | ORAL_TABLET | Freq: Three times a day (TID) | ORAL | 0 refills | Status: DC | PRN

## 2017-04-09 MED ORDER — KETOROLAC TROMETHAMINE 30 MG/ML IJ SOLN
30.0000 mg | Freq: Once | INTRAMUSCULAR | Status: AC
  Administered 2017-04-09: 30 mg via INTRAVENOUS
  Filled 2017-04-09: qty 1

## 2017-04-09 MED ORDER — PROMETHAZINE HCL 25 MG/ML IJ SOLN
12.5000 mg | Freq: Once | INTRAMUSCULAR | Status: AC
  Administered 2017-04-09: 12.5 mg via INTRAVENOUS
  Filled 2017-04-09: qty 1

## 2017-04-09 MED ORDER — SODIUM CHLORIDE 0.9 % IV BOLUS (SEPSIS)
1000.0000 mL | Freq: Once | INTRAVENOUS | Status: AC
  Administered 2017-04-09: 1000 mL via INTRAVENOUS

## 2017-04-09 MED ORDER — ONDANSETRON 4 MG PO TBDP: 4.0000 mg | ORAL_TABLET | Freq: Three times a day (TID) | ORAL | 0 refills | Status: DC | PRN

## 2017-04-09 MED ORDER — ASPIRIN 81 MG PO CHEW
324.0000 mg | CHEWABLE_TABLET | Freq: Once | ORAL | Status: AC
  Administered 2017-04-09: 324 mg via ORAL
  Filled 2017-04-09: qty 4

## 2017-04-09 MED ORDER — LORAZEPAM 2 MG/ML IJ SOLN
0.5000 mg | Freq: Once | INTRAMUSCULAR | Status: AC
  Administered 2017-04-09: 0.5 mg via INTRAVENOUS
  Filled 2017-04-09: qty 1

## 2017-04-09 NOTE — ED Provider Notes (Signed)
[facility] EMERGENCY DEPARTMENT Provider Note   CSN: [id number] Arrival date & time: 04/09/17  2993     History   Chief Complaint Chief Complaint  Patient presents with  . Dizziness    HPI Andrew Love is a 51 y.o. male.  Pt presents to the ED today with a near syncopal event while at work.  Pt said he woke up and felt fine this morning.  He ate breakfast and took his normal medications and went to work at 6.  Around 6:10, he suddenly felt like he was going to pass out.  He became nauseous and dizzy.  Pt developed a severe h/a.  He said a co-worker checked his bp and it was high.  Pt also said that his hands have been swelling since yesterday.  The only new medication change was he increased his lexapro to 20 mg from 10 today per his doctor's recommendation.      Past Medical History:  Diagnosis Date  . Arthritis   . Back pain    chronic  . Bronchitis    history of  . CAD (coronary artery disease)   . Chronic back pain   . Chronic insomnia    Per medical history form dated 06/13/10.  . Chronic left hip pain   . Colitis    Per medical history from dated 06/13/10.  . Diverticulitis    Hx of; requiring 3 admissions  . Elevated liver enzymes   . Hypertension   . Hypothyroidism   . Left leg pain    chronic  . 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 Psoriasis    Per medical history form dated 06/13/10.  . Sigmoid colon ulcer    Rectal polyps  . Sleep apnea with use of continuous positive airway pressure (CPAP)     Patient Active Problem List   Diagnosis Date Noted  . LUQ pain 06/19/2016  . Symptomatic cholelithiasis 08/30/2015  . Preoperative cardiovascular examination   . Coronary artery disease involving native coronary artery of native heart without angina pectoris   . Gastroenteritis 08/26/2015  . Cholelithiases 08/24/2015  . Abdominal pain 08/24/2015  . Cholecystitis, acute 08/24/2015  . Diarrhea 08/24/2015  . Acute cholecystitis  08/24/2015  . Nausea without vomiting 04/08/2015  . Diverticulitis of colon   . Cerebral thrombosis with cerebral infarction ([place]) 03/21/2014  . Stroke ([place]) 03/21/2014  . Essential hypertension 03/21/2014  . TIA (transient ischemic attack)   . HLD (hyperlipidemia)   . Thyroid activity decreased   . Colitis 11/20/2012  . CAD (coronary artery disease)   . Hip pain 08/04/2011  . 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 LIVER FUNCTION TESTS, ABNORMAL, HX OF 12/17/2008    Past Surgical History:  Procedure Laterality Date  . APPENDECTOMY     8th grade  . BACK SURGERY    . CARDIAC CATHETERIZATION  2009  . CARDIAC CATHETERIZATION     Per medical history from dated 06/13/10.  . CERVICAL SPINE SURGERY     C4, C5, C6 spinal fusion  . CHOLECYSTECTOMY N/A 08/31/2015   Procedure: LAPAROSCOPIC CHOLECYSTECTOMY WITH INTRAOPERATIVE CHOLANGIOGRAM;  Surgeon: Excell Seltzer, MD;  Location: [facility];  Service: General;  Laterality: N/A;  . COLONOSCOPY  07/2008   Colitis,NSAID v. Ischemia,malrotation of the gut,Diverticulosis(L),hyperplastic  . COLONOSCOPY N/A 12/13/2012   Dr. Oneida Alar: Normal TI, mild sigmoid diverticulosis, hemorrhoids, 2 polyps (tubular adenoma). Random colon bx negative. Next TCS 12/2022 with Fentanyl/phenergan  . ESOPHAGOGASTRODUODENOSCOPY N/A 04/16/2014  RMR: Erosive reflux esophagitis. Non critical Schzki's ring not manipulated. Small hiatal hernia. Abnormal gastirc mucosa of doubtful signigicance status post biopsy. I suspect trivial upper GI bleed. Recent abdominal pain presumably secondary to a protracted bout of diverticulitis. CT scan November 23 revealed improvemetn without complication. He finished his antibiotics 2 days age. Left sided ab  . JOINT REPLACEMENT    . LAPAROSCOPIC SIGMOID COLECTOMY  2012   Dr. Fanny Skates: recurrent sigmoid diverticulitis  . LAPAROSCOPIC SIGMOID COLECTOMY  2012   recurernt sigmoid diverticulitis, Dr. Dalbert Batman   . ROTATOR  CUFF REPAIR     Left - per medical history form dated 06/13/10.  Marland Kitchen SHOULDER SURGERY     Left  . THYROIDECTOMY     Per medical history form dated 06/13/10.  Marland Kitchen TOTAL HIP ARTHROPLASTY  07/04/2011   Procedure: TOTAL HIP ARTHROPLASTY;  Surgeon: Johnny Bridge, MD;  Location: [facility];  Service: Orthopedics;  Laterality: Right;       Home Medications    Prior to Admission medications   Medication Sig Start Date End Date Taking? Authorizing Provider  acetaminophen (TYLENOL) 500 MG tablet Take 1,000 mg by mouth every 6 (six) hours as needed for mild pain, moderate pain, fever or headache.   Yes [provider]  diphenhydrAMINE (BENADRYL) 25 MG tablet Take 50 mg by mouth every 6 (six) hours as needed for allergies.    Yes [provider]  EPINEPHrine (EPIPEN) 0.3 mg/0.3 mL SOAJ Inject 0.3 mLs (0.3 mg total) into the muscle as needed. Patient taking differently: Inject 0.3 mg into the muscle daily as needed (allergic reaction).  12/01/12  Yes Teressa Lower, MD  escitalopram (LEXAPRO) 20 MG tablet Take 20 mg by mouth daily.   Yes [provider]  hydrocortisone cream 1 % Apply 1 application topically once as needed for itching.   Yes [provider]  levothyroxine (SYNTHROID, LEVOTHROID) 300 MCG tablet Take 300 mcg by mouth daily. 03/26/14  Yes [provider]  olmesartan (BENICAR) 20 MG tablet Take 20 mg by mouth daily.    Yes [provider]  pantoprazole (PROTONIX) 40 MG tablet TAKE ONE TABLET BY MOUTH ONCE DAILY 10/31/16  Yes Annitta Needs, NP  zolpidem (AMBIEN) 10 MG tablet Take 10 mg by mouth at bedtime.    Yes [provider]  meclizine (ANTIVERT) 25 MG tablet Take 1 tablet (25 mg total) by mouth 3 (three) times daily as needed for dizziness. 04/09/17   Isla Pence, MD  ondansetron (ZOFRAN ODT) 4 MG disintegrating tablet Take 1 tablet (4 mg total) by mouth every 8 (eight) hours as needed. 04/09/17   Isla Pence, MD    Family  History Family History  Problem Relation Age of Onset  . Cancer Mother        breast cancer - per medical history form dated 06/13/10.  . Diverticulitis Mother   . Hypertension Mother   . Cancer Father        skin - per medical history form dated 06/13/10.  Marland Kitchen Hypertension Father   . Anesthesia problems Neg Hx   . Hypotension Neg Hx   . Malignant hyperthermia Neg Hx   . Pseudochol deficiency Neg Hx   . Colon cancer Neg Hx     Social History Social History   Tobacco Use  . Smoking status: Current Every Day Smoker    Packs/day: 0.50    Years: 18.00    Pack years: 9.00    Types: Cigarettes  .  Smokeless tobacco: Never Used  . Tobacco comment:    Substance Use Topics  . Alcohol use: No    Alcohol/week: 0.0 oz  . Drug use: No     Allergies   Other; Iohexol; Shrimp [shellfish allergy]; Wheat bran; and Yeast-related products   Review of Systems Review of Systems  Neurological: Positive for dizziness, light-headedness and headaches.  All other systems reviewed and are negative.    Physical Exam Updated Vital Signs BP 135/78 (BP Location: Left Arm)   Pulse (!) 55   Temp 98 F (36.7 C) (Oral)   Resp 18   Ht 6\' 1"  (1.854 m)   Wt 109.8 kg (242 lb)   SpO2 96%   BMI 31.93 kg/m   Physical Exam  Constitutional: He is oriented to person, place, and time. He appears well-developed and well-nourished.  HENT:  Head: Normocephalic and atraumatic.  Right Ear: External ear normal.  Left Ear: External ear normal.  Nose: Nose normal.  Mouth/Throat: Oropharynx is clear and moist.  Eyes: Conjunctivae and EOM are normal. Pupils are equal, round, and reactive to light.  Neck: Normal range of motion. Neck supple.  Cardiovascular: Normal rate, regular rhythm, normal heart sounds and intact distal pulses.  Pulmonary/Chest: Effort normal and breath sounds normal.  Abdominal: Soft. Bowel sounds are normal.  Musculoskeletal: Normal range of motion.  Neurological: He is alert and  oriented to person, place, and time.  Skin: Skin is warm and dry. Capillary refill takes less than 2 seconds.  Psychiatric: He has a normal mood and affect. His behavior is normal. Judgment and thought content normal.  Nursing note and vitals reviewed.    ED Treatments / Results  Labs (all labs ordered are listed, but only abnormal results are displayed) Labs Reviewed  BASIC METABOLIC PANEL - Abnormal; Notable for the following components:      Result Value   Glucose, Bld 114 (*)    All other components within normal limits  URINALYSIS, ROUTINE W REFLEX MICROSCOPIC - Abnormal; Notable for the following components:   Color, Urine COLORLESS (*)    Specific Gravity, Urine 1.004 (*)    All other components within normal limits  CBC  TROPONIN I  TROPONIN I    EKG  EKG Interpretation  Date/Time:  Monday April 09 2017 07:49:43 EST Ventricular Rate:  73 PR Interval:    QRS Duration: 91 QT Interval:  379 QTC Calculation: 418 R Axis:   3 Text Interpretation:  Sinus rhythm Confirmed by Isla Pence [phone]) on 04/09/2017 8:00:08 AM       Radiology Dg Chest 2 View  Result Date: 04/09/2017 CLINICAL DATA:  Shortness of breath, chest pain. EXAM: CHEST  2 VIEW COMPARISON:  Radiographs of July 04, 2016. FINDINGS: The heart size and mediastinal contours are within normal limits. Both lungs are clear. No pneumothorax or pleural effusion is noted. The visualized skeletal structures are unremarkable. IMPRESSION: No active cardiopulmonary disease. Electronically Signed   By: Marijo Conception, M.D.   On: 04/09/2017 08:35   Ct Head Wo Contrast  Result Date: 04/09/2017 CLINICAL DATA:  Near syncope, headache with nausea and dizziness. Elevated BP. EXAM: CT HEAD WITHOUT CONTRAST TECHNIQUE: Contiguous axial images were obtained from the base of the skull through the vertex without intravenous contrast. COMPARISON:  CT head 03/21/2014.  MR head 03/21/2014. FINDINGS: Brain: No evidence for  acute infarction, hemorrhage, mass lesion, hydrocephalus, or extra-axial fluid. Normal for age cerebral volume. No significant white matter disease. Vascular: No hyperdense  vessel or unexpected calcification. Skull: Normal. Negative for fracture or focal lesion. Sinuses/Orbits: No acute finding. Other: None. IMPRESSION: Negative exam.  No change from priors. Electronically Signed   By: Staci Righter M.D.   On: 04/09/2017 08:37    Procedures Procedures (including critical care time)  Medications Ordered in ED Medications  aspirin chewable tablet 324 mg (324 mg Oral Given 04/09/17 0805)  sodium chloride 0.9 % bolus 1,000 mL (0 mLs Intravenous Stopped 04/09/17 0931)  ondansetron (ZOFRAN) injection 4 mg (4 mg Intravenous Given 04/09/17 0808)  meclizine (ANTIVERT) tablet 25 mg (25 mg Oral Given 04/09/17 1012)  ketorolac (TORADOL) 30 MG/ML injection 30 mg (30 mg Intravenous Given 04/09/17 1012)  promethazine (PHENERGAN) injection 12.5 mg (12.5 mg Intravenous Given 04/09/17 1134)  LORazepam (ATIVAN) injection 0.5 mg (0.5 mg Intravenous Given 04/09/17 1139)     Initial Impression / Assessment and Plan / ED Course  I have reviewed the triage vital signs and the nursing notes.  Pertinent labs & imaging results that were available during my care of the patient were reviewed by me and considered in my medical decision making (see chart for details).  Pt is feeling much better.  Sx sound more like peripheral vertigo.  He is encouraged to f/u with pcp and return if worse.  Final Clinical Impressions(s) / ED Diagnoses   Final diagnoses:  Vertigo    ED Discharge Orders        Ordered    meclizine (ANTIVERT) 25 MG tablet  3 times daily PRN     04/09/17 1209    ondansetron (ZOFRAN ODT) 4 MG disintegrating tablet  Every 8 hours PRN     04/09/17 1209       Isla Pence, MD 04/09/17 1211

## 2017-04-09 NOTE — ED Triage Notes (Signed)
Episode at work this morning where he felt like he was going to pass out.  Pt then became nauseated and dizzy.  C/o headache.  Co- worker checked blood pressure and it was high.  Also c/o bilateral hand swelling since yesterday.

## 2017-05-19 ENCOUNTER — Emergency Department (HOSPITAL_COMMUNITY)
Admission: EM | Admit: 2017-05-19 | Discharge: 2017-05-19 | Disposition: A | Discharge status: Home / Self Care | Payer: Commercial Managed Care - PPO | Attending: Emergency Medicine | Admitting: Emergency Medicine

## 2017-05-19 ENCOUNTER — Encounter (HOSPITAL_COMMUNITY): Payer: Self-pay | Admitting: *Deleted

## 2017-05-19 ENCOUNTER — Emergency Department (HOSPITAL_COMMUNITY): Payer: Commercial Managed Care - PPO

## 2017-05-19 DIAGNOSIS — M542 Cervicalgia: Secondary | ICD-10-CM | POA: Insufficient documentation

## 2017-05-19 DIAGNOSIS — I2510 Atherosclerotic heart disease of native coronary artery without angina pectoris: Secondary | ICD-10-CM | POA: Insufficient documentation

## 2017-05-19 DIAGNOSIS — Z96641 Presence of right artificial hip joint: Secondary | ICD-10-CM | POA: Diagnosis not present

## 2017-05-19 DIAGNOSIS — G4489 Other headache syndrome: Secondary | ICD-10-CM | POA: Diagnosis not present

## 2017-05-19 DIAGNOSIS — Z79899 Other long term (current) drug therapy: Secondary | ICD-10-CM | POA: Diagnosis not present

## 2017-05-19 DIAGNOSIS — F17210 Nicotine dependence, cigarettes, uncomplicated: Secondary | ICD-10-CM | POA: Diagnosis not present

## 2017-05-19 DIAGNOSIS — E039 Hypothyroidism, unspecified: Secondary | ICD-10-CM | POA: Diagnosis not present

## 2017-05-19 DIAGNOSIS — I10 Essential (primary) hypertension: Secondary | ICD-10-CM | POA: Insufficient documentation

## 2017-05-19 MED ORDER — DIPHENHYDRAMINE HCL 50 MG/ML IJ SOLN
25.0000 mg | Freq: Once | INTRAMUSCULAR | Status: AC
  Administered 2017-05-19: 25 mg via INTRAVENOUS
  Filled 2017-05-19: qty 1

## 2017-05-19 MED ORDER — METOCLOPRAMIDE HCL 5 MG/ML IJ SOLN
10.0000 mg | Freq: Once | INTRAMUSCULAR | Status: AC
  Administered 2017-05-19: 10 mg via INTRAVENOUS
  Filled 2017-05-19: qty 2

## 2017-05-19 NOTE — ED Triage Notes (Signed)
Pt is ambulatory with steady gait to triage room reports he went to roll over in the bed and started having neck pain/headache, pain on the left side, radiates down the shoulder. Also c/o blurry vision in both eyes and nausea from the pain. Took oxycodone around 11pm for the pain. No numbness or tingling. Hx of neck surgery previously

## 2017-05-19 NOTE — Discharge Instructions (Signed)

## 2017-05-19 NOTE — ED Provider Notes (Signed)
[facility] Provider Note   CSN: [id number] Arrival date & time: 05/19/17  0023     History   Chief Complaint Chief Complaint  Patient presents with  . Neck Pain    HPI Andrew Love is a 52 y.o. male.  The history is provided by the patient and the spouse.  Neck Pain   This is a new problem. The current episode started 3 to 5 hours ago. The problem occurs constantly. The problem has been gradually worsening. The pain is associated with nothing. There has been no fever. The pain is present in the left side. The quality of the pain is described as stabbing. Radiates to: head. The pain is severe. The symptoms are aggravated by twisting. The pain is the same all the time. Associated symptoms include headaches. Pertinent negatives include no visual change, no chest pain, no syncope, no numbness and no weakness. He has tried bed rest for the symptoms. The treatment provided no relief.  Patient reports that approximately 10:30 PM on January 11 he rolled over in bed and had acute onset of left-sided neck pain. He reports the pain is in his left neck, posterior scalp, and with some radiation into his shoulder No visual loss/weakness/numbness Recent trauma reported No Dizziness reported He has a history of neck pain, history of previous neck surgery, but is never felt pain like this He reports nausea due to pain   Past Medical History:  Diagnosis Date  . Arthritis   . Back pain    chronic  . Bronchitis    history of  . CAD (coronary artery disease)   . Chronic back pain   . Chronic insomnia    Per medical history form dated 06/13/10.  . Chronic left hip pain   . Colitis    Per medical history from dated 06/13/10.  . Diverticulitis    Hx of; requiring 3 admissions  . Elevated liver enzymes   . Hypertension   . Hypothyroidism   . Left leg pain    chronic  . 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 Psoriasis    Per medical history  form dated 06/13/10.  . Sigmoid colon ulcer    Rectal polyps  . Sleep apnea with use of continuous positive airway pressure (CPAP)     Patient Active Problem List   Diagnosis Date Noted  . LUQ pain 06/19/2016  . Symptomatic cholelithiasis 08/30/2015  . Preoperative cardiovascular examination   . Coronary artery disease involving native coronary artery of native heart without angina pectoris   . Gastroenteritis 08/26/2015  . Cholelithiases 08/24/2015  . Abdominal pain 08/24/2015  . Cholecystitis, acute 08/24/2015  . Diarrhea 08/24/2015  . Acute cholecystitis 08/24/2015  . Nausea without vomiting 04/08/2015  . Diverticulitis of colon   . Cerebral thrombosis with cerebral infarction ([place]) 03/21/2014  . Stroke ([place]) 03/21/2014  . Essential hypertension 03/21/2014  . TIA (transient ischemic attack)   . HLD (hyperlipidemia)   . Thyroid activity decreased   . Colitis 11/20/2012  . CAD (coronary artery disease)   . Hip pain 08/04/2011  . 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 LIVER FUNCTION TESTS, ABNORMAL, HX OF 12/17/2008    Past Surgical History:  Procedure Laterality Date  . APPENDECTOMY     8th grade  . BACK SURGERY    . CARDIAC CATHETERIZATION  2009  . CARDIAC CATHETERIZATION     Per medical history from dated 06/13/10.  . CERVICAL SPINE  SURGERY     C4, C5, C6 spinal fusion  . CHOLECYSTECTOMY N/A 08/31/2015   Procedure: LAPAROSCOPIC CHOLECYSTECTOMY WITH INTRAOPERATIVE CHOLANGIOGRAM;  Surgeon: Excell Seltzer, MD;  Location: [facility];  Service: General;  Laterality: N/A;  . COLONOSCOPY  07/2008   Colitis,NSAID v. Ischemia,malrotation of the gut,Diverticulosis(L),hyperplastic  . COLONOSCOPY N/A 12/13/2012   Dr. Oneida Alar: Normal TI, mild sigmoid diverticulosis, hemorrhoids, 2 polyps (tubular adenoma). Random colon bx negative. Next TCS 12/2022 with Fentanyl/phenergan  . ESOPHAGOGASTRODUODENOSCOPY N/A 04/16/2014   RMR: Erosive reflux esophagitis. Non critical  Schzki's ring not manipulated. Small hiatal hernia. Abnormal gastirc mucosa of doubtful signigicance status post biopsy. I suspect trivial upper GI bleed. Recent abdominal pain presumably secondary to a protracted bout of diverticulitis. CT scan November 23 revealed improvemetn without complication. He finished his antibiotics 2 days age. Left sided ab  . JOINT REPLACEMENT    . LAPAROSCOPIC SIGMOID COLECTOMY  2012   Dr. Fanny Skates: recurrent sigmoid diverticulitis  . LAPAROSCOPIC SIGMOID COLECTOMY  2012   recurernt sigmoid diverticulitis, Dr. Dalbert Batman   . ROTATOR CUFF REPAIR     Left - per medical history form dated 06/13/10.  Marland Kitchen SHOULDER SURGERY     Left  . THYROIDECTOMY     Per medical history form dated 06/13/10.  Marland Kitchen TOTAL HIP ARTHROPLASTY  07/04/2011   Procedure: TOTAL HIP ARTHROPLASTY;  Surgeon: Johnny Bridge, MD;  Location: [facility];  Service: Orthopedics;  Laterality: Right;       Home Medications    Prior to Admission medications   Medication Sig Start Date End Date Taking? Authorizing Provider  acetaminophen (TYLENOL) 500 MG tablet Take 1,000 mg by mouth every 6 (six) hours as needed for mild pain, moderate pain, fever or headache.    [provider]  diphenhydrAMINE (BENADRYL) 25 MG tablet Take 50 mg by mouth every 6 (six) hours as needed for allergies.     [provider]  EPINEPHrine (EPIPEN) 0.3 mg/0.3 mL SOAJ Inject 0.3 mLs (0.3 mg total) into the muscle as needed. Patient taking differently: Inject 0.3 mg into the muscle daily as needed (allergic reaction).  12/01/12   Teressa Lower, MD  escitalopram (LEXAPRO) 20 MG tablet Take 20 mg by mouth daily.    [provider]  hydrocortisone cream 1 % Apply 1 application topically once as needed for itching.    [provider]  levothyroxine (SYNTHROID, LEVOTHROID) 300 MCG tablet Take 300 mcg by mouth daily. 03/26/14   [provider]  meclizine (ANTIVERT) 25 MG tablet Take 1 tablet (25 mg  total) by mouth 3 (three) times daily as needed for dizziness. 04/09/17   Isla Pence, MD  olmesartan (BENICAR) 20 MG tablet Take 20 mg by mouth daily.     [provider]  ondansetron (ZOFRAN ODT) 4 MG disintegrating tablet Take 1 tablet (4 mg total) by mouth every 8 (eight) hours as needed. 04/09/17   Isla Pence, MD  pantoprazole (PROTONIX) 40 MG tablet TAKE ONE TABLET BY MOUTH ONCE DAILY 10/31/16   Annitta Needs, NP  zolpidem (AMBIEN) 10 MG tablet Take 10 mg by mouth at bedtime.     [provider]    Family History Family History  Problem Relation Age of Onset  . Cancer Mother        breast cancer - per medical history form dated 06/13/10.  . Diverticulitis Mother   . Hypertension Mother   . Cancer Father        skin - per  medical history form dated 06/13/10.  Marland Kitchen Hypertension Father   . Anesthesia problems Neg Hx   . Hypotension Neg Hx   . Malignant hyperthermia Neg Hx   . Pseudochol deficiency Neg Hx   . Colon cancer Neg Hx     Social History Social History   Tobacco Use  . Smoking status: Current Every Day Smoker    Packs/day: 0.50    Years: 18.00    Pack years: 9.00    Types: Cigarettes  . Smokeless tobacco: Never Used  . Tobacco comment:    Substance Use Topics  . Alcohol use: No    Alcohol/week: 0.0 oz  . Drug use: No     Allergies   Other; Iohexol; Shrimp [shellfish allergy]; Wheat bran; and Yeast-related products   Review of Systems Review of Systems  Constitutional: Negative for fever.  Eyes: Negative for visual disturbance.  Respiratory: Negative for shortness of breath.   Cardiovascular: Negative for chest pain and syncope.  Musculoskeletal: Positive for neck pain.  Neurological: Positive for headaches. Negative for dizziness, syncope, weakness and numbness.  All other systems reviewed and are negative.    Physical Exam Updated Vital Signs BP 126/77 (BP Location: Right Arm)   Pulse 75   Temp 98.2 F (36.8 C) (Oral)    Resp 18   Ht 1.854 m (6\' 1" )   Wt 107 kg (236 lb)   SpO2 96%   BMI 31.14 kg/m   Physical Exam CONSTITUTIONAL: Well developed/well nourished, uncomfortable appearing HEAD: Normocephalic/atraumatic EYES: EOMI/PERRL, no nystagmus, no ptosis ENMT: Mucous membranes moist NECK: supple no meningeal signs, no bruits SPINE/BACK:entire spine nontender, mild left cervical spinal tenderness CV: S1/S2 noted, no murmurs/rubs/gallops noted LUNGS: Lungs are clear to auscultation bilaterally, no apparent distress ABDOMEN: soft, nontender, no rebound or guarding GU:no cva tenderness NEURO:Awake/alert, face symmetric, no arm or leg drift is noted Equal 5/5 strength with shoulder abduction, elbow flex/extension, wrist flex/extension in upper extremities and equal hand grips bilaterally Equal 5/5 strength with hip flexion,knee flex/extension, foot dorsi/plantar flexion Cranial nerves 3/4/5/6/11/13/08/11/12 tested and intact Gait normal without ataxia No past pointing Sensation to light touch intact in all extremities EXTREMITIES: pulses normal, full ROM SKIN: warm, color normal PSYCH: no abnormalities of mood noted, alert and oriented to situation    ED Treatments / Results  Labs (all labs ordered are listed, but only abnormal results are displayed) Labs Reviewed - No data to display  EKG  EKG Interpretation None       Radiology Ct Head Wo Contrast  Result Date: 05/19/2017 CLINICAL DATA:  Headache neck pain EXAM: CT HEAD WITHOUT CONTRAST TECHNIQUE: Contiguous axial images were obtained from the base of the skull through the vertex without intravenous contrast. COMPARISON:  04/09/2017 FINDINGS: Brain: No acute territorial infarction, hemorrhage or intracranial mass is visualized. The ventricles are non enlarged. Vascular: No hyperdense vessel or unexpected calcification. Skull: Normal. Negative for fracture or focal lesion. Sinuses/Orbits: No acute finding. Other: None IMPRESSION: Negative.  No  CT evidence for acute intracranial abnormality. Electronically Signed   By: Donavan Foil M.D.   On: 05/19/2017 03:57    Procedures Procedures (including critical care time)  Medications Ordered in ED Medications  metoCLOPramide (REGLAN) injection 10 mg (10 mg Intravenous Given 05/19/17 0421)  diphenhydrAMINE (BENADRYL) injection 25 mg (25 mg Intravenous Given 05/19/17 0422)     Initial Impression / Assessment and Plan / ED Course  I have reviewed the triage vital signs and the nursing notes.  Pertinent  imaging results that were available during my care of the patient were reviewed by me and considered in my medical decision making (see chart for details).     3:49 AM Patient with onset of left-sided neck pain headache, approximately 5 hours ago It is hard to determine if this is musculoskeletal related or headache Due to rapid onset of pain, will proceed with CT imaging If this is negative, then my suspicion for [facility] or other acute neurologic emergency is low since less than 6 hrs He has no other focal neuro deficits at this time 5:14 AM Improved, no distress, he has full range of motion of his neck. He has no focal neuro deficits The pain seems to emanate from his neck and he has had as well as some left shoulder pain At this point my suspicion for acute neurologic catastrophe is low  He feels comfortable for discharge We discussed strict ER return precautions Also discussed need to follow-up with his neurosurgeon if neck pain continues Final Clinical Impressions(s) / ED Diagnoses   Final diagnoses:  Acute neck pain  Other headache syndrome    ED Discharge Orders    None       Ripley Fraise, MD 05/19/17 [phone]

## 2017-07-15 ENCOUNTER — Other Ambulatory Visit: Payer: Self-pay

## 2017-07-15 ENCOUNTER — Encounter (HOSPITAL_COMMUNITY): Payer: Self-pay | Admitting: *Deleted

## 2017-07-15 ENCOUNTER — Emergency Department (HOSPITAL_COMMUNITY)
Admission: EM | Admit: 2017-07-15 | Discharge: 2017-07-16 | Disposition: A | Discharge status: Home / Self Care | Payer: Commercial Managed Care - PPO | Attending: Emergency Medicine | Admitting: Emergency Medicine

## 2017-07-15 ENCOUNTER — Emergency Department (HOSPITAL_COMMUNITY): Payer: Commercial Managed Care - PPO

## 2017-07-15 DIAGNOSIS — Z79899 Other long term (current) drug therapy: Secondary | ICD-10-CM | POA: Diagnosis not present

## 2017-07-15 DIAGNOSIS — I10 Essential (primary) hypertension: Secondary | ICD-10-CM | POA: Diagnosis not present

## 2017-07-15 DIAGNOSIS — Z96641 Presence of right artificial hip joint: Secondary | ICD-10-CM | POA: Diagnosis not present

## 2017-07-15 DIAGNOSIS — Z8673 Personal history of transient ischemic attack (TIA), and cerebral infarction without residual deficits: Secondary | ICD-10-CM | POA: Insufficient documentation

## 2017-07-15 DIAGNOSIS — R1032 Left lower quadrant pain: Secondary | ICD-10-CM | POA: Diagnosis not present

## 2017-07-15 DIAGNOSIS — I2510 Atherosclerotic heart disease of native coronary artery without angina pectoris: Secondary | ICD-10-CM | POA: Diagnosis not present

## 2017-07-15 DIAGNOSIS — E039 Hypothyroidism, unspecified: Secondary | ICD-10-CM | POA: Diagnosis not present

## 2017-07-15 DIAGNOSIS — F17210 Nicotine dependence, cigarettes, uncomplicated: Secondary | ICD-10-CM | POA: Diagnosis not present

## 2017-07-15 DIAGNOSIS — R110 Nausea: Secondary | ICD-10-CM | POA: Insufficient documentation

## 2017-07-15 LAB — CBC WITH DIFFERENTIAL/PLATELET
Basophils Absolute: 0 10*3/uL (ref 0.0–0.1)
Basophils Relative: 0 %
Eosinophils Absolute: 0.2 10*3/uL (ref 0.0–0.7)
Eosinophils Relative: 2 %
HCT: 49.7 % (ref 39.0–52.0)
Hemoglobin: 17 g/dL (ref 13.0–17.0)
Lymphocytes Relative: 14 %
Lymphs Abs: 1.9 10*3/uL (ref 0.7–4.0)
MCH: 32.1 pg (ref 26.0–34.0)
MCHC: 34.2 g/dL (ref 30.0–36.0)
MCV: 94 fL (ref 78.0–100.0)
Monocytes Absolute: 0.8 10*3/uL (ref 0.1–1.0)
Monocytes Relative: 6 %
Neutro Abs: 10.9 10*3/uL — ABNORMAL HIGH (ref 1.7–7.7)
Neutrophils Relative %: 78 %
Platelets: 272 10*3/uL (ref 150–400)
RBC: 5.29 MIL/uL (ref 4.22–5.81)
RDW: 12.7 % (ref 11.5–15.5)
WBC: 13.9 10*3/uL — ABNORMAL HIGH (ref 4.0–10.5)

## 2017-07-15 LAB — URINALYSIS, ROUTINE W REFLEX MICROSCOPIC
Bilirubin Urine: NEGATIVE
Glucose, UA: NEGATIVE mg/dL
Hgb urine dipstick: NEGATIVE
Ketones, ur: NEGATIVE mg/dL
Leukocytes, UA: NEGATIVE
Nitrite: NEGATIVE
Protein, ur: NEGATIVE mg/dL
Specific Gravity, Urine: 1.013 (ref 1.005–1.030)
pH: 6 (ref 5.0–8.0)

## 2017-07-15 LAB — BASIC METABOLIC PANEL
Anion gap: 12 (ref 5–15)
BUN: 12 mg/dL (ref 6–20)
CO2: 23 mmol/L (ref 22–32)
Calcium: 9.2 mg/dL (ref 8.9–10.3)
Chloride: 104 mmol/L (ref 101–111)
Creatinine, Ser: 1.01 mg/dL (ref 0.61–1.24)
GFR calc Af Amer: >60 mL/min (ref >60)
GFR calc non Af Amer: >60 mL/min (ref >60)
Glucose, Bld: 103 mg/dL — ABNORMAL HIGH (ref 65–99)
Potassium: 3.6 mmol/L (ref 3.5–5.1)
Sodium: 139 mmol/L (ref 135–145)

## 2017-07-15 MED ORDER — IBUPROFEN 600 MG PO TABS: 600.0000 mg | ORAL_TABLET | Freq: Four times a day (QID) | ORAL | 0 refills | Status: DC | PRN

## 2017-07-15 MED ORDER — HYDROCODONE-ACETAMINOPHEN 5-325 MG PO TABS
2.0000 | ORAL_TABLET | Freq: Once | ORAL | Status: AC
  Administered 2017-07-16: 2 via ORAL
  Filled 2017-07-15: qty 2

## 2017-07-15 MED ORDER — SODIUM CHLORIDE 0.9 % IV SOLN
Freq: Once | INTRAVENOUS | Status: AC
  Administered 2017-07-15: 22:00:00 via INTRAVENOUS

## 2017-07-15 MED ORDER — ONDANSETRON HCL 4 MG/2ML IJ SOLN
4.0000 mg | Freq: Once | INTRAMUSCULAR | Status: AC
  Administered 2017-07-15: 4 mg via INTRAVENOUS
  Filled 2017-07-15: qty 2

## 2017-07-15 MED ORDER — METHOCARBAMOL 500 MG PO TABS: 500.0000 mg | ORAL_TABLET | Freq: Two times a day (BID) | ORAL | 0 refills | Status: DC | PRN

## 2017-07-15 MED ORDER — ONDANSETRON 4 MG PO TBDP: 4.0000 mg | ORAL_TABLET | Freq: Three times a day (TID) | ORAL | 0 refills | Status: DC | PRN

## 2017-07-15 NOTE — ED Triage Notes (Addendum)
Pt reports llq pain that radiates into his groin with n/v that started after dinner around 6:30pm. Pt reports he feels like he has been "kicked" in his groin with sharp shooting pain.

## 2017-07-15 NOTE — Discharge Instructions (Signed)
Your abdominal pain cause is not clear on the CT scan and the bloodwork.  Take Ibuprofen as needed every 8 hours Take Zofran for nausea Take Robaxin for muscle spasms.  ER if you have worsening pain / nausea / vomiting  Please obtain all of your results from medical records or have your doctors office obtain the results - share them with your doctor - you should be seen at your doctors office in the next 2 days. Call today to arrange your follow up. Take the medications as prescribed. Please review all of the medicines and only take them if you do not have an allergy to them. Please be aware that if you are taking birth control pills, taking other prescriptions, ESPECIALLY ANTIBIOTICS may make the birth control ineffective - if this is the case, either do not engage in sexual activity or use alternative methods of birth control such as condoms until you have finished the medicine and your family doctor says it is OK to restart them. If you are on a blood thinner such as COUMADIN, be aware that any other medicine that you take may cause the coumadin to either work too much, or not enough - you should have your coumadin level rechecked in next 7 days if this is the case.  ?  It is also a possibility that you have an allergic reaction to any of the medicines that you have been prescribed - Everybody reacts differently to medications and while MOST people have no trouble with most medicines, you may have a reaction such as nausea, vomiting, rash, swelling, shortness of breath. If this is the case, please stop taking the medicine immediately and contact your physician.  ?  You should return to the ER if you develop severe or worsening symptoms.

## 2017-07-15 NOTE — ED Notes (Signed)
Patient transported to CT 

## 2017-07-15 NOTE — ED Provider Notes (Signed)
[facility] EMERGENCY DEPARTMENT Provider Note   CSN: [id number] Arrival date & time: 07/15/17  2033     History   Chief Complaint Chief Complaint  Patient presents with  . Abdominal Pain    HPI Andrew Love is a 52 y.o. male.  HPI  The patient is a 52 year old male, with a history of prior diverticulitis status post resection of his sigmoid colon secondary to complications from the diverticulitis in 2014.  He denies prior history of kidney stones.  He has had appendicitis and cholecystectomy in the past.  The patient reports that approximately 3 hours ago he developed rather acute onset of left lower quadrant pain, this is similar to prior pain in location as he seems to always get it in that spot.  He has also had associated nausea with this but denies fevers or chills.  Nothing seems to make it better, worse with palpation, not associated with fevers.  He has had several CT scans since his surgery which have all been negative for acute pathology all radiating into the left lower quadrant.  No urinary symptoms, no bowel symptoms  Past Medical History:  Diagnosis Date  . Arthritis   . Back pain    chronic  . Bronchitis    history of  . CAD (coronary artery disease)   . Chronic back pain   . Chronic insomnia    Per medical history form dated 06/13/10.  . Chronic left hip pain   . Colitis    Per medical history from dated 06/13/10.  . Diverticulitis    Hx of; requiring 3 admissions  . Elevated liver enzymes   . Hypertension   . Hypothyroidism   . Left leg pain    chronic  . 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 Psoriasis    Per medical history form dated 06/13/10.  . Sigmoid colon ulcer    Rectal polyps  . Sleep apnea with use of continuous positive airway pressure (CPAP)     Patient Active Problem List   Diagnosis Date Noted  . LUQ pain 06/19/2016  . Symptomatic cholelithiasis 08/30/2015  . Preoperative cardiovascular examination   .  Coronary artery disease involving native coronary artery of native heart without angina pectoris   . Gastroenteritis 08/26/2015  . Cholelithiases 08/24/2015  . Abdominal pain 08/24/2015  . Cholecystitis, acute 08/24/2015  . Diarrhea 08/24/2015  . Acute cholecystitis 08/24/2015  . Nausea without vomiting 04/08/2015  . Diverticulitis of colon   . Cerebral thrombosis with cerebral infarction ([place]) 03/21/2014  . Stroke ([place]) 03/21/2014  . Essential hypertension 03/21/2014  . TIA (transient ischemic attack)   . HLD (hyperlipidemia)   . Thyroid activity decreased   . Colitis 11/20/2012  . CAD (coronary artery disease)   . Hip pain 08/04/2011  . 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 LIVER FUNCTION TESTS, ABNORMAL, HX OF 12/17/2008    Past Surgical History:  Procedure Laterality Date  . APPENDECTOMY     8th grade  . BACK SURGERY    . CARDIAC CATHETERIZATION  2009  . CARDIAC CATHETERIZATION     Per medical history from dated 06/13/10.  . CERVICAL SPINE SURGERY     C4, C5, C6 spinal fusion  . CHOLECYSTECTOMY N/A 08/31/2015   Procedure: LAPAROSCOPIC CHOLECYSTECTOMY WITH INTRAOPERATIVE CHOLANGIOGRAM;  Surgeon: Excell Seltzer, MD;  Location: [facility];  Service: General;  Laterality: N/A;  . COLONOSCOPY  07/2008   Colitis,NSAID v. Ischemia,malrotation of the gut,Diverticulosis(L),hyperplastic  .  COLONOSCOPY N/A 12/13/2012   Dr. Oneida Alar: Normal TI, mild sigmoid diverticulosis, hemorrhoids, 2 polyps (tubular adenoma). Random colon bx negative. Next TCS 12/2022 with Fentanyl/phenergan  . ESOPHAGOGASTRODUODENOSCOPY N/A 04/16/2014   RMR: Erosive reflux esophagitis. Non critical Schzki's ring not manipulated. Small hiatal hernia. Abnormal gastirc mucosa of doubtful signigicance status post biopsy. I suspect trivial upper GI bleed. Recent abdominal pain presumably secondary to a protracted bout of diverticulitis. CT scan November 23 revealed improvemetn without complication. He  finished his antibiotics 2 days age. Left sided ab  . JOINT REPLACEMENT    . LAPAROSCOPIC SIGMOID COLECTOMY  2012   Dr. Fanny Skates: recurrent sigmoid diverticulitis  . LAPAROSCOPIC SIGMOID COLECTOMY  2012   recurernt sigmoid diverticulitis, Dr. Dalbert Batman   . ROTATOR CUFF REPAIR     Left - per medical history form dated 06/13/10.  Marland Kitchen SHOULDER SURGERY     Left  . THYROIDECTOMY     Per medical history form dated 06/13/10.  Marland Kitchen TOTAL HIP ARTHROPLASTY  07/04/2011   Procedure: TOTAL HIP ARTHROPLASTY;  Surgeon: Johnny Bridge, MD;  Location: [facility];  Service: Orthopedics;  Laterality: Right;       Home Medications    Prior to Admission medications   Medication Sig Start Date End Date Taking? Authorizing Provider  acetaminophen (TYLENOL) 500 MG tablet Take 1,000 mg by mouth every 6 (six) hours as needed for mild pain, moderate pain, fever or headache.    [provider]  diphenhydrAMINE (BENADRYL) 25 MG tablet Take 50 mg by mouth every 6 (six) hours as needed for allergies.     [provider]  EPINEPHrine (EPIPEN) 0.3 mg/0.3 mL SOAJ Inject 0.3 mLs (0.3 mg total) into the muscle as needed. Patient taking differently: Inject 0.3 mg into the muscle daily as needed (allergic reaction).  12/01/12   Teressa Lower, MD  escitalopram (LEXAPRO) 20 MG tablet Take 20 mg by mouth daily.    [provider]  hydrocortisone cream 1 % Apply 1 application topically once as needed for itching.    [provider]  ibuprofen (ADVIL,MOTRIN) 600 MG tablet Take 1 tablet (600 mg total) by mouth every 6 (six) hours as needed. 07/15/17   Noemi Chapel, MD  levothyroxine (SYNTHROID, LEVOTHROID) 300 MCG tablet Take 300 mcg by mouth daily. 03/26/14   [provider]  meclizine (ANTIVERT) 25 MG tablet Take 1 tablet (25 mg total) by mouth 3 (three) times daily as needed for dizziness. 04/09/17   Isla Pence, MD  methocarbamol (ROBAXIN) 500 MG tablet Take 1 tablet (500 mg total) by  mouth 2 (two) times daily as needed for muscle spasms. 07/15/17   Noemi Chapel, MD  olmesartan (BENICAR) 20 MG tablet Take 20 mg by mouth daily.     [provider]  ondansetron (ZOFRAN ODT) 4 MG disintegrating tablet Take 1 tablet (4 mg total) by mouth every 8 (eight) hours as needed for nausea. 07/15/17   Noemi Chapel, MD  pantoprazole (PROTONIX) 40 MG tablet TAKE ONE TABLET BY MOUTH ONCE DAILY 10/31/16   Annitta Needs, NP  zolpidem (AMBIEN) 10 MG tablet Take 10 mg by mouth at bedtime.     [provider]    Family History Family History  Problem Relation Age of Onset  . Cancer Mother        breast cancer - per medical history form dated 06/13/10.  . Diverticulitis Mother   . Hypertension Mother   . Cancer Father  skin - per medical history form dated 06/13/10.  Marland Kitchen Hypertension Father   . Anesthesia problems Neg Hx   . Hypotension Neg Hx   . Malignant hyperthermia Neg Hx   . Pseudochol deficiency Neg Hx   . Colon cancer Neg Hx     Social History Social History   Tobacco Use  . Smoking status: Current Every Day Smoker    Packs/day: 0.50    Years: 18.00    Pack years: 9.00    Types: Cigarettes  . Smokeless tobacco: Never Used  . Tobacco comment:    Substance Use Topics  . Alcohol use: No    Alcohol/week: 0.0 oz  . Drug use: No     Allergies   Other; Iohexol; Shrimp [shellfish allergy]; Wheat bran; and Yeast-related products   Review of Systems Review of Systems  All other systems reviewed and are negative.    Physical Exam Updated Vital Signs BP 100/64 (BP Location: Left Arm)   Pulse 69   Temp 98 F (36.7 C) (Oral)   Resp 18   Ht 6\' 1"  (1.854 m)   Wt 107 kg (236 lb)   SpO2 97%   BMI 31.14 kg/m   Physical Exam  Constitutional: He appears well-developed and well-nourished. No distress.  HENT:  Head: Normocephalic and atraumatic.  Mouth/Throat: Oropharynx is clear and moist. No oropharyngeal exudate.  Eyes: Conjunctivae and EOM are  normal. Pupils are equal, round, and reactive to light. Right eye exhibits no discharge. Left eye exhibits no discharge. No scleral icterus.  Neck: Normal range of motion. Neck supple. No JVD present. No thyromegaly present.  Cardiovascular: Normal rate, regular rhythm, normal heart sounds and intact distal pulses. Exam reveals no gallop and no friction rub.  No murmur heard. Pulmonary/Chest: Effort normal and breath sounds normal. No respiratory distress. He has no wheezes. He has no rales.  Abdominal: Soft. Bowel sounds are normal. He exhibits no distension and no mass. There is tenderness. There is guarding.  There is focal tenderness and guarding in the left lower quadrant, there is no peritoneal signs and the abdomen is completely soft and nontender in the right upper and right lower quadrants.  Musculoskeletal: Normal range of motion. He exhibits no edema or tenderness.  Lymphadenopathy:    He has no cervical adenopathy.  Neurological: He is alert. Coordination normal.  Skin: Skin is warm and dry. No rash noted. No erythema.  Psychiatric: He has a normal mood and affect. His behavior is normal.  Nursing note and vitals reviewed.    ED Treatments / Results  Labs (all labs ordered are listed, but only abnormal results are displayed) Labs Reviewed  CBC WITH DIFFERENTIAL/PLATELET - Abnormal; Notable for the following components:      Result Value   WBC 13.9 (*)    Neutro Abs 10.9 (*)    All other components within normal limits  BASIC METABOLIC PANEL - Abnormal; Notable for the following components:   Glucose, Bld 103 (*)    All other components within normal limits  URINALYSIS, ROUTINE W REFLEX MICROSCOPIC   Radiology Ct Abdomen Pelvis Wo Contrast  Result Date: 07/15/2017 CLINICAL DATA:  Pt reports llq pain that radiates into his groin with n/v that started after dinner around 6:30pm. Pt reports he feels like he has been "kicked" in his groin with sharp shooting pain.H/x  diverticulitis, patient states small amount of colon removed due to diverticulitis doesn't know how much or where it was located. EXAM: CT ABDOMEN AND  PELVIS WITHOUT CONTRAST TECHNIQUE: Multidetector CT imaging of the abdomen and pelvis was performed following the standard protocol without IV contrast. COMPARISON:  10/20/2016 FINDINGS: Lower chest: Clear lung bases.  Heart normal in size. Hepatobiliary: No focal liver abnormality is seen. Status post cholecystectomy. No biliary dilatation. Pancreas: Unremarkable. No pancreatic ductal dilatation or surrounding inflammatory changes. Spleen: Normal in size without focal abnormality. Adrenals/Urinary Tract: Adrenal glands are unremarkable. Kidneys are normal, without renal calculi, focal lesion, or hydronephrosis. Bladder is unremarkable. Stomach/Bowel: There are scattered left colon diverticula. No diverticulitis. No colonic distention, wall thickening or inflammation. Cecum crosses midline and lies in the left mid abdomen. Vascular/Lymphatic: Mild aortic atherosclerosis. Prominent gastrohepatic ligament lymph nodes, stable from prior CT. No other prominent lymph nodes. Reproductive: Enlarged prostate measuring 5.3 x 4.3 x 4.3 cm. Other: No abdominal wall hernia or abnormality. No abdominopelvic ascites. Musculoskeletal: Right hip total arthroplasty appears well seated and aligned. Arthropathic changes of the left hip. No fracture or acute finding. No osteoblastic or osteolytic lesions. IMPRESSION: 1. No acute findings. 2. Left colon diverticula without evidence of diverticulitis. 3. Mild aortic atherosclerosis. 4. Prostatic enlargement. Electronically Signed   By: Lajean Manes M.D.   On: 07/15/2017 21:56    Procedures Procedures (including critical care time)  Medications Ordered in ED Medications  HYDROcodone-acetaminophen (NORCO/VICODIN) 5-325 MG per tablet 2 tablet (not administered)  ondansetron (ZOFRAN) injection 4 mg (4 mg Intravenous Given 07/15/17  2133)  0.9 %  sodium chloride infusion ( Intravenous Stopped 07/15/17 2309)  ondansetron (ZOFRAN) injection 4 mg (4 mg Intravenous Given 07/15/17 2215)     Initial Impression / Assessment and Plan / ED Course  I have reviewed the triage vital signs and the nursing notes.  Pertinent labs & imaging results that were available during my care of the patient were reviewed by me and considered in my medical decision making (see chart for details).     Nausea, abdominal pain focal to the left lower quadrant at the site of the patient's prior surgery.  Will need to obtain CT scan imaging, he does have an allergy to contrast dye, noncontrast CT has been ordered  Labs, Zofran, IV.  CT neg, UA without infection, Pt given hydrocodone for pain Improved, well appearing, stable for d.c  Final Clinical Impressions(s) / ED Diagnoses   Final diagnoses:  Left lower quadrant abdominal pain of unknown etiology    ED Discharge Orders        Ordered    ibuprofen (ADVIL,MOTRIN) 600 MG tablet  Every 6 hours PRN     07/15/17 2343    methocarbamol (ROBAXIN) 500 MG tablet  2 times daily PRN     07/15/17 2343    ondansetron (ZOFRAN ODT) 4 MG disintegrating tablet  Every 8 hours PRN     07/15/17 2348       Noemi Chapel, MD 07/15/17 2350

## 2017-07-15 NOTE — ED Notes (Signed)
ED Provider at bedside. 

## 2017-07-20 ENCOUNTER — Other Ambulatory Visit: Payer: Self-pay

## 2017-07-20 ENCOUNTER — Encounter (HOSPITAL_COMMUNITY): Payer: Self-pay | Admitting: Emergency Medicine

## 2017-07-20 ENCOUNTER — Telehealth: Payer: Self-pay | Admitting: Gastroenterology

## 2017-07-20 ENCOUNTER — Emergency Department (HOSPITAL_COMMUNITY): Payer: Commercial Managed Care - PPO

## 2017-07-20 ENCOUNTER — Emergency Department (HOSPITAL_COMMUNITY)
Admission: EM | Admit: 2017-07-20 | Discharge: 2017-07-20 | Disposition: A | Discharge status: Home / Self Care | Payer: Commercial Managed Care - PPO | Attending: Emergency Medicine | Admitting: Emergency Medicine

## 2017-07-20 DIAGNOSIS — E039 Hypothyroidism, unspecified: Secondary | ICD-10-CM | POA: Diagnosis not present

## 2017-07-20 DIAGNOSIS — R112 Nausea with vomiting, unspecified: Secondary | ICD-10-CM | POA: Insufficient documentation

## 2017-07-20 DIAGNOSIS — F17210 Nicotine dependence, cigarettes, uncomplicated: Secondary | ICD-10-CM | POA: Diagnosis not present

## 2017-07-20 DIAGNOSIS — R197 Diarrhea, unspecified: Secondary | ICD-10-CM | POA: Diagnosis not present

## 2017-07-20 DIAGNOSIS — Z8673 Personal history of transient ischemic attack (TIA), and cerebral infarction without residual deficits: Secondary | ICD-10-CM | POA: Insufficient documentation

## 2017-07-20 DIAGNOSIS — R1032 Left lower quadrant pain: Secondary | ICD-10-CM | POA: Diagnosis not present

## 2017-07-20 DIAGNOSIS — I10 Essential (primary) hypertension: Secondary | ICD-10-CM | POA: Diagnosis not present

## 2017-07-20 DIAGNOSIS — I2510 Atherosclerotic heart disease of native coronary artery without angina pectoris: Secondary | ICD-10-CM | POA: Insufficient documentation

## 2017-07-20 DIAGNOSIS — Z79899 Other long term (current) drug therapy: Secondary | ICD-10-CM | POA: Diagnosis not present

## 2017-07-20 DIAGNOSIS — Z96641 Presence of right artificial hip joint: Secondary | ICD-10-CM | POA: Diagnosis not present

## 2017-07-20 LAB — CBC
HCT: 49.2 % (ref 39.0–52.0)
Hemoglobin: 16.5 g/dL (ref 13.0–17.0)
MCH: 31.4 pg (ref 26.0–34.0)
MCHC: 33.5 g/dL (ref 30.0–36.0)
MCV: 93.5 fL (ref 78.0–100.0)
Platelets: 243 10*3/uL (ref 150–400)
RBC: 5.26 MIL/uL (ref 4.22–5.81)
RDW: 12.2 % (ref 11.5–15.5)
WBC: 7.4 10*3/uL (ref 4.0–10.5)

## 2017-07-20 LAB — URINALYSIS, ROUTINE W REFLEX MICROSCOPIC
Bilirubin Urine: NEGATIVE
Glucose, UA: NEGATIVE mg/dL
Hgb urine dipstick: NEGATIVE
Ketones, ur: NEGATIVE mg/dL
Leukocytes, UA: NEGATIVE
Nitrite: NEGATIVE
Protein, ur: NEGATIVE mg/dL
Specific Gravity, Urine: 1.025 (ref 1.005–1.030)
pH: 5 (ref 5.0–8.0)

## 2017-07-20 LAB — C DIFFICILE QUICK SCREEN W PCR REFLEX
C Diff antigen: NEGATIVE
C Diff interpretation: NOT DETECTED
C Diff toxin: NEGATIVE

## 2017-07-20 LAB — COMPREHENSIVE METABOLIC PANEL
ALT: 82 U/L — ABNORMAL HIGH (ref 17–63)
AST: 39 U/L (ref 15–41)
Albumin: 4.2 g/dL (ref 3.5–5.0)
Alkaline Phosphatase: 106 U/L (ref 38–126)
Anion gap: 12 (ref 5–15)
BUN: 11 mg/dL (ref 6–20)
CO2: 26 mmol/L (ref 22–32)
Calcium: 9.1 mg/dL (ref 8.9–10.3)
Chloride: 102 mmol/L (ref 101–111)
Creatinine, Ser: 1.01 mg/dL (ref 0.61–1.24)
GFR calc Af Amer: >60 mL/min (ref >60)
GFR calc non Af Amer: >60 mL/min (ref >60)
Glucose, Bld: 99 mg/dL (ref 65–99)
Potassium: 3.7 mmol/L (ref 3.5–5.1)
Sodium: 140 mmol/L (ref 135–145)
Total Bilirubin: 1 mg/dL (ref 0.3–1.2)
Total Protein: 7.3 g/dL (ref 6.5–8.1)

## 2017-07-20 LAB — LIPASE, BLOOD: Lipase: 23 U/L (ref 11–51)

## 2017-07-20 MED ORDER — PROMETHAZINE HCL 25 MG PO TABS: 25.0000 mg | ORAL_TABLET | Freq: Four times a day (QID) | ORAL | 0 refills | Status: DC | PRN

## 2017-07-20 MED ORDER — ONDANSETRON HCL 4 MG PO TABS: 4.0000 mg | ORAL_TABLET | Freq: Three times a day (TID) | ORAL | 0 refills | Status: DC | PRN

## 2017-07-20 MED ORDER — DIPHENOXYLATE-ATROPINE 2.5-0.025 MG PO TABS: 1.0000 | ORAL_TABLET | Freq: Four times a day (QID) | ORAL | 0 refills | Status: DC | PRN

## 2017-07-20 MED ORDER — PROMETHAZINE HCL 25 MG/ML IJ SOLN
12.5000 mg | Freq: Once | INTRAMUSCULAR | Status: AC
  Administered 2017-07-20: 12.5 mg via INTRAVENOUS
  Filled 2017-07-20: qty 1

## 2017-07-20 MED ORDER — HYDROMORPHONE HCL 1 MG/ML IJ SOLN
0.5000 mg | Freq: Once | INTRAMUSCULAR | Status: AC
  Administered 2017-07-20: 0.5 mg via INTRAVENOUS
  Filled 2017-07-20: qty 1

## 2017-07-20 MED ORDER — ONDANSETRON HCL 4 MG/2ML IJ SOLN
4.0000 mg | Freq: Once | INTRAMUSCULAR | Status: AC
  Administered 2017-07-20: 4 mg via INTRAVENOUS
  Filled 2017-07-20: qty 2

## 2017-07-20 MED ORDER — DIPHENOXYLATE-ATROPINE 2.5-0.025 MG PO TABS
1.0000 | ORAL_TABLET | Freq: Once | ORAL | Status: AC
  Administered 2017-07-20: 1 via ORAL
  Filled 2017-07-20: qty 1

## 2017-07-20 MED ORDER — SODIUM CHLORIDE 0.9 % IV BOLUS (SEPSIS)
1000.0000 mL | Freq: Once | INTRAVENOUS | Status: AC
  Administered 2017-07-20: 1000 mL via INTRAVENOUS

## 2017-07-20 NOTE — ED Provider Notes (Signed)
Pt signed out from [place], [place] pending CT imaging results and stool sample for C dif check.  CT imaging negative, results discussed with patient and wife.  He was able to give a stool sample but only after given PO intake which stimulated bm.    7:36 PM Pending c dif result at this time.  9:18 PM c dif resulted and negative.   Pt had further nausea and reports zofran gives him very short span of relief.  Phenergan given with significantly better nausea relief.  Discussed antidiarrheals.  Will give a dose of lomotil here, small script.  Advised to use very sparingly as this can cause constipation.   Evalee Jefferson, PA-C 07/20/17 2128    Francine Graven, DO 07/20/17 2354

## 2017-07-20 NOTE — Telephone Encounter (Signed)
CALLED BY ED. PT LAST SEEN IN APR 2018. WAS HAVING LLQ PAIN, AND DIARRHEA LIKELY BILE SALT DIARRHEA. ASKED TO CHEW TUMS WITH MEALS THREE TIMES A DAY. HOLD FOR CONSTIPATION. STRICTLY FOLLOW A LOW FAT DIET.  RECENT VISIT TO ED AFTER EATING AT THE STEAKHOUSE AND CT NEGATIVE. RETURNED TODAY BECAUSE HE COULDN'T SEE HIS PCP AND HE IS NOT BETTER. CONSIDER REPEAT CT. PT MAY HAVE FOLLOW UP IN 2-3 WEEKS E30 LLQ ABDOMINAL PAIN. WEIGHT 245 LBS APR 2018 AND NOW 235? LBS.  PT SHOULD AVOID HIGH FAT FOODS AND CHEW ONE TUMS WITH MEALS TID.

## 2017-07-20 NOTE — ED Provider Notes (Signed)
[facility] Provider Note   CSN: [id number] Arrival date & time: 07/20/17  1231     History   Chief Complaint Chief Complaint  Patient presents with  . Abdominal Pain    HPI Andrew Love is a 52 y.o. male.  HPI  Andrew Love is a 52 y.o. male with hx of colitis, diverticulitis with sigmoid colectomy, who presents to the Emergency Department complaining of persistent left lower abdominal pain for one week.  He was seen here on 07/15/17 for similar sx's.  Pain to his abdomen began after eating at a local steakhouse.  He reports having multiple episodes of diarrhea that he describes as loose, brown and watery and few episodes of vomiting.  dull pain to left lower abdomen that is constant.  States his pain feels similar to previous episodes of diverticulitis.  He has been taking Zofran, robaxin and ibuprofen without relief. Also complains of generalized weakness and feels "dehydrated."  He also states that he completed a course of antibiotics ( thinks it was Augmentin) recently for a "cold" He denies fever, chest pain, shortness of breath, dysuria, and pain that radiates to his testicle or groin.      Past Medical History:  Diagnosis Date  . Arthritis   . Back pain    chronic  . Bronchitis    history of  . CAD (coronary artery disease)   . Chronic back pain   . Chronic insomnia    Per medical history form dated 06/13/10.  . Chronic left hip pain   . Colitis    Per medical history from dated 06/13/10.  . Diverticulitis    Hx of; requiring 3 admissions  . Elevated liver enzymes   . Hypertension   . Hypothyroidism   . Left leg pain    chronic  . 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 Psoriasis    Per medical history form dated 06/13/10.  . Sigmoid colon ulcer    Rectal polyps  . Sleep apnea with use of continuous positive airway pressure (CPAP)     Patient Active Problem List   Diagnosis Date Noted  . LUQ pain 06/19/2016    . Symptomatic cholelithiasis 08/30/2015  . Preoperative cardiovascular examination   . Coronary artery disease involving native coronary artery of native heart without angina pectoris   . Gastroenteritis 08/26/2015  . Cholelithiases 08/24/2015  . Abdominal pain 08/24/2015  . Cholecystitis, acute 08/24/2015  . Diarrhea 08/24/2015  . Acute cholecystitis 08/24/2015  . Nausea without vomiting 04/08/2015  . Diverticulitis of colon   . Cerebral thrombosis with cerebral infarction ([place]) 03/21/2014  . Stroke ([place]) 03/21/2014  . Essential hypertension 03/21/2014  . TIA (transient ischemic attack)   . HLD (hyperlipidemia)   . Thyroid activity decreased   . Colitis 11/20/2012  . CAD (coronary artery disease)   . Hip pain 08/04/2011  . 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 LIVER FUNCTION TESTS, ABNORMAL, HX OF 12/17/2008    Past Surgical History:  Procedure Laterality Date  . APPENDECTOMY     8th grade  . BACK SURGERY    . CARDIAC CATHETERIZATION  2009  . CARDIAC CATHETERIZATION     Per medical history from dated 06/13/10.  . CERVICAL SPINE SURGERY     C4, C5, C6 spinal fusion  . CHOLECYSTECTOMY N/A 08/31/2015   Procedure: LAPAROSCOPIC CHOLECYSTECTOMY WITH INTRAOPERATIVE CHOLANGIOGRAM;  Surgeon: Excell Seltzer, MD;  Location: [facility];  Service: General;  Laterality:  N/A;  . COLONOSCOPY  07/2008   Colitis,NSAID v. Ischemia,malrotation of the gut,Diverticulosis(L),hyperplastic  . COLONOSCOPY N/A 12/13/2012   Dr. Oneida Alar: Normal TI, mild sigmoid diverticulosis, hemorrhoids, 2 polyps (tubular adenoma). Random colon bx negative. Next TCS 12/2022 with Fentanyl/phenergan  . ESOPHAGOGASTRODUODENOSCOPY N/A 04/16/2014   RMR: Erosive reflux esophagitis. Non critical Schzki's ring not manipulated. Small hiatal hernia. Abnormal gastirc mucosa of doubtful signigicance status post biopsy. I suspect trivial upper GI bleed. Recent abdominal pain presumably secondary to a protracted  bout of diverticulitis. CT scan November 23 revealed improvemetn without complication. He finished his antibiotics 2 days age. Left sided ab  . JOINT REPLACEMENT    . LAPAROSCOPIC SIGMOID COLECTOMY  2012   Dr. Fanny Skates: recurrent sigmoid diverticulitis  . LAPAROSCOPIC SIGMOID COLECTOMY  2012   recurernt sigmoid diverticulitis, Dr. Dalbert Batman   . ROTATOR CUFF REPAIR     Left - per medical history form dated 06/13/10.  Marland Kitchen SHOULDER SURGERY     Left  . THYROIDECTOMY     Per medical history form dated 06/13/10.  Marland Kitchen TOTAL HIP ARTHROPLASTY  07/04/2011   Procedure: TOTAL HIP ARTHROPLASTY;  Surgeon: Johnny Bridge, MD;  Location: [facility];  Service: Orthopedics;  Laterality: Right;       Home Medications    Prior to Admission medications   Medication Sig Start Date End Date Taking? Authorizing Provider  acetaminophen (TYLENOL) 500 MG tablet Take 1,000 mg by mouth every 6 (six) hours as needed for mild pain, moderate pain, fever or headache.    [provider]  diphenhydrAMINE (BENADRYL) 25 MG tablet Take 50 mg by mouth every 6 (six) hours as needed for allergies.     [provider]  EPINEPHrine (EPIPEN) 0.3 mg/0.3 mL SOAJ Inject 0.3 mLs (0.3 mg total) into the muscle as needed. Patient taking differently: Inject 0.3 mg into the muscle daily as needed (allergic reaction).  12/01/12   Teressa Lower, MD  escitalopram (LEXAPRO) 20 MG tablet Take 20 mg by mouth daily.    [provider]  hydrocortisone cream 1 % Apply 1 application topically once as needed for itching.    [provider]  ibuprofen (ADVIL,MOTRIN) 600 MG tablet Take 1 tablet (600 mg total) by mouth every 6 (six) hours as needed. 07/15/17   Noemi Chapel, MD  levothyroxine (SYNTHROID, LEVOTHROID) 300 MCG tablet Take 300 mcg by mouth daily. 03/26/14   [provider]  meclizine (ANTIVERT) 25 MG tablet Take 1 tablet (25 mg total) by mouth 3 (three) times daily as needed for dizziness. 04/09/17    Isla Pence, MD  methocarbamol (ROBAXIN) 500 MG tablet Take 1 tablet (500 mg total) by mouth 2 (two) times daily as needed for muscle spasms. 07/15/17   Noemi Chapel, MD  olmesartan (BENICAR) 20 MG tablet Take 20 mg by mouth daily.     [provider]  ondansetron (ZOFRAN ODT) 4 MG disintegrating tablet Take 1 tablet (4 mg total) by mouth every 8 (eight) hours as needed for nausea. 07/15/17   Noemi Chapel, MD  pantoprazole (PROTONIX) 40 MG tablet TAKE ONE TABLET BY MOUTH ONCE DAILY 10/31/16   Annitta Needs, NP  zolpidem (AMBIEN) 10 MG tablet Take 10 mg by mouth at bedtime.     [provider]    Family History Family History  Problem Relation Age of Onset  . Cancer Mother        breast cancer - per medical history form dated 06/13/10.  . Diverticulitis Mother   .  Hypertension Mother   . Cancer Father        skin - per medical history form dated 06/13/10.  Marland Kitchen Hypertension Father   . Anesthesia problems Neg Hx   . Hypotension Neg Hx   . Malignant hyperthermia Neg Hx   . Pseudochol deficiency Neg Hx   . Colon cancer Neg Hx     Social History Social History   Tobacco Use  . Smoking status: Current Every Day Smoker    Packs/day: 0.50    Years: 18.00    Pack years: 9.00    Types: Cigarettes  . Smokeless tobacco: Never Used  . Tobacco comment:    Substance Use Topics  . Alcohol use: No    Alcohol/week: 0.0 oz  . Drug use: No     Allergies   Other; Iohexol; Shrimp [shellfish allergy]; Wheat bran; and Yeast-related products   Review of Systems Review of Systems  Constitutional: Negative for appetite change, chills and fever.  Respiratory: Negative for shortness of breath.   Cardiovascular: Negative for chest pain.  Gastrointestinal: Positive for abdominal pain, diarrhea, nausea and vomiting. Negative for abdominal distention, blood in stool and constipation.  Genitourinary: Negative for difficulty urinating and dysuria.  Musculoskeletal: Negative for back  pain.  Skin: Negative for color change and rash.  Neurological: Negative for dizziness, weakness and numbness.  Hematological: Negative for adenopathy.  All other systems reviewed and are negative.    Physical Exam Updated Vital Signs BP 112/79 (BP Location: Right Arm)   Pulse (!) 58   Temp 97.7 F (36.5 C) (Oral)   Resp 16   SpO2 94%   Physical Exam  Constitutional: He is oriented to person, place, and time. He appears well-developed and well-nourished. No distress.  HENT:  Head: Normocephalic and atraumatic.  Mouth/Throat: Oropharynx is clear and moist.  Cardiovascular: Normal rate, regular rhythm and intact distal pulses.  No murmur heard. Pulmonary/Chest: Effort normal and breath sounds normal. No respiratory distress.  Abdominal: Soft. Normal appearance and bowel sounds are normal. He exhibits no distension and no mass. There is tenderness in the left lower quadrant. There is no rigidity, no rebound and no guarding.  ttp of the LLQ without rebound tenderness or guarding.  Abdomen is soft.   Musculoskeletal: Normal range of motion. He exhibits no edema.  Neurological: He is alert and oriented to person, place, and time. He exhibits normal muscle tone. Coordination normal.  Skin: Skin is warm and dry.  Nursing note and vitals reviewed.    ED Treatments / Results  Labs (all labs ordered are listed, but only abnormal results are displayed) Labs Reviewed  COMPREHENSIVE METABOLIC PANEL - Abnormal; Notable for the following components:      Result Value   ALT 82 (*)    All other components within normal limits  URINALYSIS, ROUTINE W REFLEX MICROSCOPIC - Abnormal; Notable for the following components:   APPearance CLOUDY (*)    All other components within normal limits  C DIFFICILE QUICK SCREEN W PCR REFLEX  LIPASE, BLOOD  CBC    EKG  EKG Interpretation None       Radiology No results found.  Procedures Procedures (including critical care  time)  Medications Ordered in ED Medications  sodium chloride 0.9 % bolus 1,000 mL (1,000 mLs Intravenous New Bag/Given 07/20/17 1722)  ondansetron (ZOFRAN) injection 4 mg (4 mg Intravenous Given 07/20/17 1724)  HYDROmorphone (DILAUDID) injection 0.5 mg (0.5 mg Intravenous Given 07/20/17 1724)     Initial Impression /  Assessment and Plan / ED Course  I have reviewed the triage vital signs and the nursing notes.  Pertinent labs & imaging results that were available during my care of the patient were reviewed by me and considered in my medical decision making (see chart for details).    Review of previous labs and CT A/P, showed left colon diverticula without evidence of diverticulitis.  Francisco Oneida Alar and discussed findings.  She recommends to repeat the CT scan and if findings are nml, she will f/u pt in office next week    1800  End of shift, pt signed out to Evalee Jefferson, PA-C, who agrees to arrange dispo pending CT results.    Final Clinical Impressions(s) / ED Diagnoses   Final diagnoses:  None    ED Discharge Orders    None       Kem Parkinson, PA-C 07/20/17 Bray, [place], DO 07/20/17 2354

## 2017-07-20 NOTE — ED Triage Notes (Signed)
Pt c/o continued abdominal pain, n/v/d, bilateral lower back pain, and generalized weakness since Sunday. States he was evaluated Sunday night, but no improvement in symptoms. Pt referred to Dr. Gerarda Fraction, but unable to get an appointment.

## 2017-07-20 NOTE — Discharge Instructions (Signed)
Bland diet as tolerated.  Someone from Dr. Oneida Alar office should contact you on Monday for f/u appt. Or you can contact the office as well.

## 2017-07-23 NOTE — Telephone Encounter (Signed)
PATIENT SCHEDULED  °

## 2017-07-23 NOTE — Telephone Encounter (Signed)
PT called and I went over the info again from Dr. Oneida Alar. Manuela Schwartz is making him an appt in 2-3 weeks.

## 2017-07-30 ENCOUNTER — Ambulatory Visit: Payer: Commercial Managed Care - PPO | Admitting: Nurse Practitioner

## 2017-12-18 ENCOUNTER — Emergency Department (HOSPITAL_COMMUNITY): Payer: BLUE CROSS/BLUE SHIELD

## 2017-12-18 ENCOUNTER — Other Ambulatory Visit: Payer: Self-pay

## 2017-12-18 ENCOUNTER — Emergency Department (HOSPITAL_COMMUNITY)
Admission: EM | Admit: 2017-12-18 | Discharge: 2017-12-18 | Disposition: A | Discharge status: Home / Self Care | Payer: BLUE CROSS/BLUE SHIELD | Attending: Emergency Medicine | Admitting: Emergency Medicine

## 2017-12-18 ENCOUNTER — Encounter (HOSPITAL_COMMUNITY): Payer: Self-pay | Admitting: Emergency Medicine

## 2017-12-18 DIAGNOSIS — E039 Hypothyroidism, unspecified: Secondary | ICD-10-CM | POA: Insufficient documentation

## 2017-12-18 DIAGNOSIS — F17210 Nicotine dependence, cigarettes, uncomplicated: Secondary | ICD-10-CM | POA: Diagnosis not present

## 2017-12-18 DIAGNOSIS — Z96641 Presence of right artificial hip joint: Secondary | ICD-10-CM | POA: Insufficient documentation

## 2017-12-18 DIAGNOSIS — I10 Essential (primary) hypertension: Secondary | ICD-10-CM | POA: Diagnosis not present

## 2017-12-18 DIAGNOSIS — Y939 Activity, unspecified: Secondary | ICD-10-CM | POA: Insufficient documentation

## 2017-12-18 DIAGNOSIS — M545 Low back pain: Secondary | ICD-10-CM | POA: Diagnosis present

## 2017-12-18 DIAGNOSIS — I2510 Atherosclerotic heart disease of native coronary artery without angina pectoris: Secondary | ICD-10-CM | POA: Diagnosis not present

## 2017-12-18 DIAGNOSIS — Y929 Unspecified place or not applicable: Secondary | ICD-10-CM | POA: Insufficient documentation

## 2017-12-18 DIAGNOSIS — X509XXA Other and unspecified overexertion or strenuous movements or postures, initial encounter: Secondary | ICD-10-CM | POA: Insufficient documentation

## 2017-12-18 DIAGNOSIS — Z981 Arthrodesis status: Secondary | ICD-10-CM

## 2017-12-18 DIAGNOSIS — Z79899 Other long term (current) drug therapy: Secondary | ICD-10-CM | POA: Diagnosis not present

## 2017-12-18 DIAGNOSIS — Y999 Unspecified external cause status: Secondary | ICD-10-CM | POA: Diagnosis not present

## 2017-12-18 MED ORDER — OXYCODONE-ACETAMINOPHEN 5-325 MG PO TABS
2.0000 | ORAL_TABLET | Freq: Once | ORAL | Status: AC
  Administered 2017-12-18: 2 via ORAL
  Filled 2017-12-18: qty 2

## 2017-12-18 MED ORDER — KETOROLAC TROMETHAMINE 60 MG/2ML IM SOLN
60.0000 mg | Freq: Once | INTRAMUSCULAR | Status: AC
  Administered 2017-12-18: 60 mg via INTRAMUSCULAR
  Filled 2017-12-18: qty 2

## 2017-12-18 MED ORDER — OXYCODONE-ACETAMINOPHEN 5-325 MG PO TABS: 1.0000 | ORAL_TABLET | Freq: Four times a day (QID) | ORAL | 0 refills | Status: DC | PRN

## 2017-12-18 NOTE — ED Provider Notes (Signed)
[facility] EMERGENCY DEPARTMENT Provider Note   CSN: [id number] Arrival date & time: 12/18/17  0049     History   Chief Complaint Chief Complaint  Patient presents with  . Back Pain    HPI Andrew Love is a 52 y.o. male.  Patient is a 52 year old male with past medical history of hypertension, chronic low back pain, prior back surgery.  He presents today for evaluation of low back pain.  This began 2 days ago while he was changing the wheel on a lawnmower.  He states that he felt a pop in his back and he has felt severe pain since.  He feels as though there is someone standing on his low back.  He denies any radiation of his pain into his legs.  He denies any bowel or bladder complaints.  The history is provided by the patient.  Back Pain   This is a new problem. The current episode started 2 days ago. The problem occurs constantly. The problem has been rapidly worsening. The pain is present in the lumbar spine. The quality of the pain is described as stabbing.    Past Medical History:  Diagnosis Date  . Arthritis   . Back pain    chronic  . Bronchitis    history of  . CAD (coronary artery disease)   . Chronic back pain   . Chronic insomnia    Per medical history form dated 06/13/10.  . Chronic left hip pain   . Colitis    Per medical history from dated 06/13/10.  . Diverticulitis    Hx of; requiring 3 admissions  . Elevated liver enzymes   . Hypertension   . Hypothyroidism   . Left leg pain    chronic  . 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 Psoriasis    Per medical history form dated 06/13/10.  . Sigmoid colon ulcer    Rectal polyps  . Sleep apnea with use of continuous positive airway pressure (CPAP)     Patient Active Problem List   Diagnosis Date Noted  . LUQ pain 06/19/2016  . Symptomatic cholelithiasis 08/30/2015  . Preoperative cardiovascular examination   . Coronary artery disease involving native coronary artery of native  heart without angina pectoris   . Gastroenteritis 08/26/2015  . Cholelithiases 08/24/2015  . Abdominal pain 08/24/2015  . Cholecystitis, acute 08/24/2015  . Diarrhea 08/24/2015  . Acute cholecystitis 08/24/2015  . Nausea without vomiting 04/08/2015  . Diverticulitis of colon   . Cerebral thrombosis with cerebral infarction ([place]) 03/21/2014  . Stroke ([place]) 03/21/2014  . Essential hypertension 03/21/2014  . TIA (transient ischemic attack)   . HLD (hyperlipidemia)   . Thyroid activity decreased   . Colitis 11/20/2012  . CAD (coronary artery disease)   . Hip pain 08/04/2011  . 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 LIVER FUNCTION TESTS, ABNORMAL, HX OF 12/17/2008    Past Surgical History:  Procedure Laterality Date  . APPENDECTOMY     8th grade  . BACK SURGERY    . CARDIAC CATHETERIZATION  2009  . CARDIAC CATHETERIZATION     Per medical history from dated 06/13/10.  . CERVICAL SPINE SURGERY     C4, C5, C6 spinal fusion  . CHOLECYSTECTOMY N/A 08/31/2015   Procedure: LAPAROSCOPIC CHOLECYSTECTOMY WITH INTRAOPERATIVE CHOLANGIOGRAM;  Surgeon: Excell Seltzer, MD;  Location: [facility];  Service: General;  Laterality: N/A;  . COLONOSCOPY  07/2008   Colitis,NSAID v. Ischemia,malrotation of the  gut,Diverticulosis(L),hyperplastic  . COLONOSCOPY N/A 12/13/2012   Dr. Oneida Alar: Normal TI, mild sigmoid diverticulosis, hemorrhoids, 2 polyps (tubular adenoma). Random colon bx negative. Next TCS 12/2022 with Fentanyl/phenergan  . ESOPHAGOGASTRODUODENOSCOPY N/A 04/16/2014   RMR: Erosive reflux esophagitis. Non critical Schzki's ring not manipulated. Small hiatal hernia. Abnormal gastirc mucosa of doubtful signigicance status post biopsy. I suspect trivial upper GI bleed. Recent abdominal pain presumably secondary to a protracted bout of diverticulitis. CT scan November 23 revealed improvemetn without complication. He finished his antibiotics 2 days age. Left sided ab  . JOINT  REPLACEMENT    . LAPAROSCOPIC SIGMOID COLECTOMY  2012   Dr. Fanny Skates: recurrent sigmoid diverticulitis  . LAPAROSCOPIC SIGMOID COLECTOMY  2012   recurernt sigmoid diverticulitis, Dr. Dalbert Batman   . ROTATOR CUFF REPAIR     Left - per medical history form dated 06/13/10.  Marland Kitchen SHOULDER SURGERY     Left  . THYROIDECTOMY     Per medical history form dated 06/13/10.  Marland Kitchen TOTAL HIP ARTHROPLASTY  07/04/2011   Procedure: TOTAL HIP ARTHROPLASTY;  Surgeon: Johnny Bridge, MD;  Location: [facility];  Service: Orthopedics;  Laterality: Right;        Home Medications    Prior to Admission medications   Medication Sig Start Date End Date Taking? Authorizing Provider  acetaminophen (TYLENOL) 500 MG tablet Take 1,000 mg by mouth every 6 (six) hours as needed for mild pain, moderate pain, fever or headache.   Yes [provider]  diphenhydrAMINE (BENADRYL) 25 MG tablet Take 50 mg by mouth every 6 (six) hours as needed for allergies.    Yes [provider]  diphenoxylate-atropine (LOMOTIL) 2.5-0.025 MG tablet Take 1 tablet by mouth 4 (four) times daily as needed for diarrhea or loose stools. 07/20/17  Yes Idol, Almyra Free, PA-C  EPINEPHrine (EPIPEN) 0.3 mg/0.3 mL SOAJ Inject 0.3 mLs (0.3 mg total) into the muscle as needed. Patient taking differently: Inject 0.3 mg into the muscle daily as needed (allergic reaction).  12/01/12  Yes Teressa Lower, MD  escitalopram (LEXAPRO) 20 MG tablet Take 20 mg by mouth daily.   Yes [provider]  hydrocortisone cream 1 % Apply 1 application topically once as needed for itching.   Yes [provider]  ibuprofen (ADVIL,MOTRIN) 600 MG tablet Take 1 tablet (600 mg total) by mouth every 6 (six) hours as needed. 07/15/17  Yes Noemi Chapel, MD  levothyroxine (SYNTHROID, LEVOTHROID) 300 MCG tablet Take 300 mcg by mouth daily. 03/26/14  Yes [provider]  meclizine (ANTIVERT) 25 MG tablet Take 1 tablet (25 mg total) by mouth 3 (three) times daily  as needed for dizziness. 04/09/17  Yes Isla Pence, MD  methocarbamol (ROBAXIN) 500 MG tablet Take 1 tablet (500 mg total) by mouth 2 (two) times daily as needed for muscle spasms. 07/15/17  Yes Noemi Chapel, MD  olmesartan (BENICAR) 20 MG tablet Take 20 mg by mouth daily.    Yes [provider]  pantoprazole (PROTONIX) 40 MG tablet TAKE ONE TABLET BY MOUTH ONCE DAILY 10/31/16  Yes Annitta Needs, NP  promethazine (PHENERGAN) 25 MG tablet Take 1 tablet (25 mg total) by mouth every 6 (six) hours as needed for nausea or vomiting. 07/20/17  Yes Idol, Almyra Free, PA-C  zolpidem (AMBIEN) 10 MG tablet Take 10 mg by mouth at bedtime.    Yes [provider]    Family History Family History  Problem Relation Age of Onset  . Cancer Mother  breast cancer - per medical history form dated 06/13/10.  . Diverticulitis Mother   . Hypertension Mother   . Cancer Father        skin - per medical history form dated 06/13/10.  Marland Kitchen Hypertension Father   . Anesthesia problems Neg Hx   . Hypotension Neg Hx   . Malignant hyperthermia Neg Hx   . Pseudochol deficiency Neg Hx   . Colon cancer Neg Hx     Social History Social History   Tobacco Use  . Smoking status: Current Every Day Smoker    Packs/day: 0.50    Years: 18.00    Pack years: 9.00    Types: Cigarettes  . Smokeless tobacco: Never Used  . Tobacco comment:    Substance Use Topics  . Alcohol use: No    Alcohol/week: 0.0 standard drinks  . Drug use: No     Allergies   Other; Iohexol; Shrimp [shellfish allergy]; Wheat bran; and Yeast-related products   Review of Systems Review of Systems  Musculoskeletal: Positive for back pain.  All other systems reviewed and are negative.    Physical Exam Updated Vital Signs BP 131/74 (BP Location: Right Arm)   Pulse 67   Temp 98.2 F (36.8 C) (Oral)   Resp 18   Ht 6\' 1"  (1.854 m)   Wt 106.1 kg   SpO2 100%   BMI 30.87 kg/m   Physical Exam  Constitutional: He is oriented  to person, place, and time. He appears well-developed and well-nourished. No distress.  HENT:  Head: Normocephalic and atraumatic.  Neck: Normal range of motion. Neck supple.  Pulmonary/Chest: Effort normal.  Musculoskeletal:  There is tenderness to palpation in the soft tissues of the lumbar region.  Neurological: He is alert and oriented to person, place, and time.  DTRs are trace, but symmetrical in the patellar and Achilles tendons of both lower extremities.  Strength is 5 out of 5 in both lower extremities and he is able to ambulate without significant difficulty.  Skin: Skin is warm and dry. He is not diaphoretic.  Nursing note and vitals reviewed.    ED Treatments / Results  Labs (all labs ordered are listed, but only abnormal results are displayed) Labs Reviewed - No data to display  EKG None  Radiology No results found.  Procedures Procedures (including critical care time)  Medications Ordered in ED Medications  ketorolac (TORADOL) injection 60 mg (has no administration in time range)  oxyCODONE-acetaminophen (PERCOCET/ROXICET) 5-325 MG per tablet 2 tablet (has no administration in time range)     Initial Impression / Assessment and Plan / ED Course  I have reviewed the triage vital signs and the nursing notes.  Pertinent labs & imaging results that were available during my care of the patient were reviewed by me and considered in my medical decision making (see chart for details).  Patient presents with low back pain for the past 2 days.  He has a history of prior lumbar fusion surgery.  His x-rays today show no evidence of hardware complication.  He has strength and reflexes that are symmetrical and no bowel or bladder issues.  Nothing seems emergent at this time.  He was given medication here in the ER and is feeling better.  He will be discharged with anti-inflammatories, pain medicine, and is to follow-up with Dr. Saintclair Halsted if he is not improving in the next  week.  Final Clinical Impressions(s) / ED Diagnoses   Final diagnoses:  None  ED Discharge Orders    None       Veryl Speak, MD 12/18/17 [phone]

## 2017-12-18 NOTE — ED Triage Notes (Signed)
Pt c/o lower back pain after changing a tire. Pt with history of previous back surgery.

## 2017-12-18 NOTE — Discharge Instructions (Addendum)
Ibuprofen 600 mg 3 times daily for the next 5 days.  Percocet as prescribed as needed for pain not relieved with ibuprofen.  Follow-up with Dr. Saintclair Halsted if your symptoms are not improving in the next week.

## 2018-03-05 ENCOUNTER — Other Ambulatory Visit: Payer: Self-pay | Admitting: Gastroenterology

## 2018-07-26 ENCOUNTER — Emergency Department (HOSPITAL_COMMUNITY)
Admission: EM | Admit: 2018-07-26 | Discharge: 2018-07-26 | Disposition: A | Discharge status: Home / Self Care | Payer: BLUE CROSS/BLUE SHIELD | Attending: Emergency Medicine | Admitting: Emergency Medicine

## 2018-07-26 ENCOUNTER — Encounter (HOSPITAL_COMMUNITY): Payer: Self-pay | Admitting: Emergency Medicine

## 2018-07-26 ENCOUNTER — Other Ambulatory Visit: Payer: Self-pay

## 2018-07-26 ENCOUNTER — Emergency Department (HOSPITAL_COMMUNITY): Payer: BLUE CROSS/BLUE SHIELD

## 2018-07-26 DIAGNOSIS — Z79899 Other long term (current) drug therapy: Secondary | ICD-10-CM | POA: Diagnosis not present

## 2018-07-26 DIAGNOSIS — I2510 Atherosclerotic heart disease of native coronary artery without angina pectoris: Secondary | ICD-10-CM | POA: Diagnosis not present

## 2018-07-26 DIAGNOSIS — Z8673 Personal history of transient ischemic attack (TIA), and cerebral infarction without residual deficits: Secondary | ICD-10-CM | POA: Diagnosis not present

## 2018-07-26 DIAGNOSIS — E039 Hypothyroidism, unspecified: Secondary | ICD-10-CM | POA: Insufficient documentation

## 2018-07-26 DIAGNOSIS — M25552 Pain in left hip: Secondary | ICD-10-CM | POA: Diagnosis not present

## 2018-07-26 DIAGNOSIS — I10 Essential (primary) hypertension: Secondary | ICD-10-CM | POA: Insufficient documentation

## 2018-07-26 DIAGNOSIS — F17210 Nicotine dependence, cigarettes, uncomplicated: Secondary | ICD-10-CM | POA: Insufficient documentation

## 2018-07-26 DIAGNOSIS — Z96641 Presence of right artificial hip joint: Secondary | ICD-10-CM | POA: Insufficient documentation

## 2018-07-26 MED ORDER — DIAZEPAM 2 MG PO TABS
2.0000 mg | ORAL_TABLET | Freq: Once | ORAL | Status: AC
  Administered 2018-07-26: 2 mg via ORAL
  Filled 2018-07-26: qty 1

## 2018-07-26 MED ORDER — HYDROMORPHONE HCL 1 MG/ML IJ SOLN
1.0000 mg | Freq: Once | INTRAMUSCULAR | Status: AC
  Administered 2018-07-26: 1 mg via INTRAMUSCULAR
  Filled 2018-07-26: qty 1

## 2018-07-26 MED ORDER — OXYCODONE-ACETAMINOPHEN 5-325 MG PO TABS
1.0000 | ORAL_TABLET | Freq: Once | ORAL | Status: AC
  Administered 2018-07-26: 1 via ORAL
  Filled 2018-07-26: qty 1

## 2018-07-26 MED ORDER — OXYCODONE-ACETAMINOPHEN 5-325 MG PO TABS: 1.0000 | ORAL_TABLET | Freq: Four times a day (QID) | ORAL | 0 refills | Status: DC | PRN

## 2018-07-26 MED ORDER — METHOCARBAMOL 500 MG PO TABS: 500.0000 mg | ORAL_TABLET | Freq: Two times a day (BID) | ORAL | 0 refills | Status: DC | PRN

## 2018-07-26 MED ORDER — KETOROLAC TROMETHAMINE 60 MG/2ML IM SOLN
60.0000 mg | Freq: Once | INTRAMUSCULAR | Status: AC
  Administered 2018-07-26: 60 mg via INTRAMUSCULAR
  Filled 2018-07-26: qty 2

## 2018-07-26 NOTE — ED Provider Notes (Signed)
[facility] Provider Note   CSN: [id number] Arrival date & time: 07/26/18  0113    History   Chief Complaint Chief Complaint  Patient presents with   Hip Pain    HPI Andrew Love is a 53 y.o. male.      Hip Pain  This is a new problem. The current episode started 3 to 5 hours ago. The problem occurs constantly. The problem has been gradually worsening. The symptoms are aggravated by walking. The symptoms are relieved by lying down. He has tried nothing for the symptoms.    Past Medical History:  Diagnosis Date   Arthritis    Back pain    chronic   Bronchitis    history of   CAD (coronary artery disease)    Chronic back pain    Chronic insomnia    Per medical history form dated 06/13/10.   Chronic left hip pain    Colitis    Per medical history from dated 06/13/10.   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   Sigmoid colon ulcer    Rectal polyps   Sleep apnea with use of continuous positive airway pressure (CPAP)     Patient Active Problem List   Diagnosis Date Noted   LUQ pain 06/19/2016   Symptomatic cholelithiasis 08/30/2015   Preoperative cardiovascular examination    Coronary artery disease involving native coronary artery of native heart without angina pectoris    Gastroenteritis 08/26/2015   Cholelithiases 08/24/2015   Abdominal pain 08/24/2015   Cholecystitis, acute 08/24/2015   Diarrhea 08/24/2015   Acute cholecystitis 08/24/2015   Nausea without vomiting 04/08/2015   Diverticulitis of colon    Cerebral thrombosis with cerebral infarction ([place]) 03/21/2014   Stroke ([place]) 03/21/2014   Essential hypertension 03/21/2014   TIA (transient ischemic attack)    HLD (hyperlipidemia)     Thyroid activity decreased    Colitis 11/20/2012   CAD (coronary artery disease)    Hip pain 08/04/2011   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   CERVICAL SPINE SURGERY     C4, C5, C6 spinal fusion   CHOLECYSTECTOMY N/A 08/31/2015   Procedure: LAPAROSCOPIC CHOLECYSTECTOMY WITH INTRAOPERATIVE CHOLANGIOGRAM;  Surgeon: Excell Seltzer, MD;  Location: [facility];  Service: General;  Laterality: N/A;   COLONOSCOPY  07/2008   Colitis,NSAID v. Ischemia,malrotation of the gut,Diverticulosis(L),hyperplastic   COLONOSCOPY N/A 12/13/2012   Dr. Oneida Alar: Normal TI, mild sigmoid diverticulosis, hemorrhoids, 2 polyps (tubular adenoma). Random colon bx negative. Next TCS 12/2022 with Fentanyl/phenergan   ESOPHAGOGASTRODUODENOSCOPY N/A 04/16/2014   RMR: Erosive reflux esophagitis. Non critical Schzki's ring not manipulated. Small hiatal hernia. Abnormal gastirc mucosa of doubtful signigicance status post biopsy. I suspect trivial upper GI bleed. Recent abdominal pain presumably secondary to a protracted bout of diverticulitis. CT scan November 23 revealed improvemetn without complication. He finished his antibiotics 2 days age. Left sided ab  JOINT REPLACEMENT     LAPAROSCOPIC SIGMOID COLECTOMY  2012   Dr. Fanny Skates: recurrent sigmoid diverticulitis   LAPAROSCOPIC SIGMOID COLECTOMY  2012   recurernt sigmoid diverticulitis, Dr. Irish Elders CUFF REPAIR     Left - per medical history form dated 06/13/10.   SHOULDER SURGERY     Left   THYROIDECTOMY     Per medical history form dated 06/13/10.   TOTAL HIP ARTHROPLASTY  07/04/2011   Procedure: TOTAL HIP ARTHROPLASTY;  Surgeon: Johnny Bridge, MD;  Location:  [facility];  Service: Orthopedics;  Laterality: Right;        Home Medications    Prior to Admission medications   Medication Sig Start Date End Date Taking? Authorizing Provider  acetaminophen (TYLENOL) 500 MG tablet Take 1,000 mg by mouth every 6 (six) hours as needed for mild pain, moderate pain, fever or headache.    [provider]  diphenhydrAMINE (BENADRYL) 25 MG tablet Take 50 mg by mouth every 6 (six) hours as needed for allergies.     [provider]  diphenoxylate-atropine (LOMOTIL) 2.5-0.025 MG tablet Take 1 tablet by mouth 4 (four) times daily as needed for diarrhea or loose stools. 07/20/17   Evalee Jefferson, PA-C  EPINEPHrine (EPIPEN) 0.3 mg/0.3 mL SOAJ Inject 0.3 mLs (0.3 mg total) into the muscle as needed. Patient taking differently: Inject 0.3 mg into the muscle daily as needed (allergic reaction).  12/01/12   Teressa Lower, MD  escitalopram (LEXAPRO) 20 MG tablet Take 20 mg by mouth daily.    [provider]  hydrocortisone cream 1 % Apply 1 application topically once as needed for itching.    [provider]  ibuprofen (ADVIL,MOTRIN) 600 MG tablet Take 1 tablet (600 mg total) by mouth every 6 (six) hours as needed. 07/15/17   Noemi Chapel, MD  levothyroxine (SYNTHROID, LEVOTHROID) 300 MCG tablet Take 300 mcg by mouth daily. 03/26/14   [provider]  meclizine (ANTIVERT) 25 MG tablet Take 1 tablet (25 mg total) by mouth 3 (three) times daily as needed for dizziness. 04/09/17   Isla Pence, MD  methocarbamol (ROBAXIN) 500 MG tablet Take 1 tablet (500 mg total) by mouth 2 (two) times daily as needed for muscle spasms. 07/15/17   Noemi Chapel, MD  olmesartan (BENICAR) 20 MG tablet Take 20 mg by mouth daily.     [provider]  oxyCODONE-acetaminophen (PERCOCET) 5-325 MG tablet Take 1-2 tablets by mouth every 6 (six) hours as needed. 12/18/17   Veryl Speak, MD  pantoprazole (PROTONIX) 40 MG tablet TAKE 1 TABLET BY MOUTH ONCE  DAILY 03/06/18   Annitta Needs, NP  promethazine (PHENERGAN) 25 MG tablet Take 1 tablet (25 mg total) by mouth every 6 (six) hours as needed for nausea or vomiting. 07/20/17   Idol, Almyra Free, PA-C  zolpidem (AMBIEN) 10 MG tablet Take 10 mg by mouth at bedtime.     [provider]    Family History Family History  Problem Relation Age of Onset   Cancer Mother        breast cancer - per medical history form dated 06/13/10.   Diverticulitis Mother    Hypertension Mother    Cancer Father        skin - per medical history form dated 06/13/10.   Hypertension Father    Anesthesia problems Neg Hx    Hypotension Neg Hx    Malignant hyperthermia Neg Hx    Pseudochol deficiency Neg Hx  Colon cancer Neg Hx     Social History Social History   Tobacco Use   Smoking status: Current Every Day Smoker    Packs/day: 0.50    Years: 18.00    Pack years: 9.00    Types: Cigarettes   Smokeless tobacco: Never Used   Tobacco comment:    Substance Use Topics   Alcohol use: No    Alcohol/week: 0.0 standard drinks   Drug use: No     Allergies   Other; Iohexol; Shrimp [shellfish allergy]; Wheat bran; and Yeast-related products   Review of Systems Review of Systems  All other systems reviewed and are negative.    Physical Exam Updated Vital Signs BP 136/86 (BP Location: Left Arm)    Pulse 69    Temp 97.8 F (36.6 C) (Oral)    Resp 20    Ht 6\' 1"  (1.854 m)    Wt 104.8 kg    SpO2 95%    BMI 30.48 kg/m   Physical Exam Vitals signs and nursing note reviewed.  Constitutional:      Appearance: He is well-developed.  HENT:     Head: Normocephalic and atraumatic.  Eyes:     Extraocular Movements: Extraocular movements intact.     Conjunctiva/sclera: Conjunctivae normal.  Neck:     Musculoskeletal: Normal range of motion.  Cardiovascular:     Rate and Rhythm: Normal rate.  Pulmonary:     Effort: Pulmonary effort is normal. No respiratory distress.  Abdominal:      General: Abdomen is flat. There is no distension.  Musculoskeletal:        General: Tenderness (anterior left hip, worse with ROM and load bearing) present.  Skin:    General: Skin is warm and dry.  Neurological:     General: No focal deficit present.     Mental Status: He is alert.      ED Treatments / Results  Labs (all labs ordered are listed, but only abnormal results are displayed) Labs Reviewed - No data to display  EKG None  Radiology Dg Lumbar Spine 2-3 Views  Result Date: 07/26/2018 CLINICAL DATA:  The patient states he was getting out of his jeep and heard something pop and then immediately felt pain to his left hip and has since had severe difficulty moving. Hx of osteoarthritis, right hip arthroplasty, chronic left leg pain, arthritis, chronic lower back pain, appendectomy, and back surgery. EXAM: LUMBAR SPINE - 2-3 VIEW COMPARISON:  12/18/2017 FINDINGS: There is no evidence of lumbar spine fracture. Alignment is normal. Intervertebral disc spaces are maintained. IMPRESSION: Negative. Electronically Signed   By: Lajean Manes M.D.   On: 07/26/2018 02:23   Dg Hip Unilat W Or Wo Pelvis 2-3 Views Left  Result Date: 07/26/2018 CLINICAL DATA:  The patient states he was getting out of his jeep and heard something pop and then immediately felt pain to his left hip and has since had severe difficulty moving. Hx of osteoarthritis, right hip arthroplasty, chronic left leg pain, arthritis, chronic lower back pain, appendectomy, and back surgery. EXAM: DG HIP (WITH OR WITHOUT PELVIS) 2-3V LEFT COMPARISON:  CT, 07/20/2017. FINDINGS: No fracture or bone lesion. Medial left hip joint space narrowing. Marginal osteophytes are noted along the base of the left femoral head. These findings are stable when compared to the prior CT. Right total hip arthroplasty is well-seated and aligned. SI joints and symphysis pubis are normally spaced and aligned. Soft tissues are unremarkable. IMPRESSION: 1. No  fracture,  bone lesion or acute finding. 2. Mild-to-moderate arthropathic changes of the left hip, which are stable from the prior CT scan. 3. Well-positioned right total hip arthroplasty. Electronically Signed   By: Lajean Manes M.D.   On: 07/26/2018 02:22    Procedures Procedures (including critical care time)  Medications Ordered in ED Medications  HYDROmorphone (DILAUDID) injection 1 mg (has no administration in time range)  oxyCODONE-acetaminophen (PERCOCET/ROXICET) 5-325 MG per tablet 1 tablet (1 tablet Oral Given 07/26/18 0151)  ketorolac (TORADOL) injection 60 mg (60 mg Intramuscular Given 07/26/18 0151)  diazepam (VALIUM) tablet 2 mg (2 mg Oral Given 07/26/18 0151)  HYDROmorphone (DILAUDID) injection 1 mg (1 mg Intramuscular Given 07/26/18 0254)     Initial Impression / Assessment and Plan / ED Course  I have reviewed the triage vital signs and the nursing notes.  Pertinent labs & imaging results that were available during my care of the patient were reviewed by me and considered in my medical decision making (see chart for details).        Stepped out of a truck felt a pop and experienced progressively worsening excruciating pain in left hip. Imaging as above. xr's negative. Pain improved with medications, but will still need his walker at home. Will also follow up with ortho for further management and MRI if not improving.   Final Clinical Impressions(s) / ED Diagnoses   Final diagnoses:  None    ED Discharge Orders    None       Dalyah Pla, Corene Cornea, MD 07/26/18 [phone]

## 2018-07-26 NOTE — ED Notes (Signed)
Pt sates that as long as he does not move his hip it stays about 5 but when he moves it it goes up to about 8 to 10.

## 2018-07-26 NOTE — ED Notes (Signed)
Taking over care, report given. Pt in room with call light within reach.

## 2018-07-26 NOTE — ED Triage Notes (Signed)
Patient was in [place], 2 weeks ago.

## 2018-07-26 NOTE — ED Triage Notes (Signed)
Patient was stepping out of his jeep and heard something pop in left hip, have degenerative bone disease, had right hip replacement surgery, this feels like the same pain.

## 2018-12-16 ENCOUNTER — Emergency Department (HOSPITAL_COMMUNITY)
Admission: EM | Admit: 2018-12-16 | Discharge: 2018-12-16 | Disposition: A | Discharge status: Home / Self Care | Payer: BC Managed Care – PPO | Attending: Emergency Medicine | Admitting: Emergency Medicine

## 2018-12-16 ENCOUNTER — Other Ambulatory Visit: Payer: Self-pay

## 2018-12-16 ENCOUNTER — Encounter (HOSPITAL_COMMUNITY): Payer: Self-pay | Admitting: Emergency Medicine

## 2018-12-16 DIAGNOSIS — Z5321 Procedure and treatment not carried out due to patient leaving prior to being seen by health care provider: Secondary | ICD-10-CM | POA: Insufficient documentation

## 2018-12-16 DIAGNOSIS — M549 Dorsalgia, unspecified: Secondary | ICD-10-CM | POA: Diagnosis not present

## 2018-12-16 NOTE — ED Triage Notes (Signed)
Patient was getting up to use bathroom and left leg gave out, causing him to fall, has history of back surgeries, c/o of lower back pain, denies loc or hitting head.

## 2018-12-16 NOTE — ED Notes (Signed)
Informed patient of extended wait, stated he couldn't sit in chair for any longer and decided to leave after being triage.

## 2019-01-07 ENCOUNTER — Other Ambulatory Visit: Payer: Self-pay

## 2019-01-07 ENCOUNTER — Ambulatory Visit
Admission: EM | Admit: 2019-01-07 | Discharge: 2019-01-07 | Disposition: A | Discharge status: Home / Self Care | Payer: BC Managed Care – PPO | Attending: Emergency Medicine | Admitting: Emergency Medicine

## 2019-01-07 DIAGNOSIS — R51 Headache: Secondary | ICD-10-CM | POA: Diagnosis not present

## 2019-01-07 DIAGNOSIS — Z20828 Contact with and (suspected) exposure to other viral communicable diseases: Secondary | ICD-10-CM | POA: Diagnosis not present

## 2019-01-07 DIAGNOSIS — R5383 Other fatigue: Secondary | ICD-10-CM

## 2019-01-07 DIAGNOSIS — Z20822 Contact with and (suspected) exposure to covid-19: Secondary | ICD-10-CM

## 2019-01-07 DIAGNOSIS — R197 Diarrhea, unspecified: Secondary | ICD-10-CM | POA: Diagnosis not present

## 2019-01-07 MED ORDER — ONDANSETRON HCL 4 MG PO TABS: 4.0000 mg | ORAL_TABLET | Freq: Four times a day (QID) | ORAL | 0 refills | Status: DC

## 2019-01-07 NOTE — ED Triage Notes (Signed)
Pt has c/o headache and diarrhea, work requiring testing

## 2019-01-07 NOTE — ED Provider Notes (Signed)
[facility]   PR:[id number] 01/07/19 Arrival Time: [id number]   CC: HA, diarrhea  SUBJECTIVE: History from: patient.  Andrew Love is a 53 y.o. male who presents with HA and 9 episodes of loose stools/ diarrhea that began 1 day.  Denies sick exposure to COVID, flu or strep.  Denies recent travel.  Has tried zofran, and OTC immodium with relief.  Symptoms are made worse with eating.  Reports previous symptoms in the past with viral GI bug.   Complains of associated nausea, abdominal discomfort, and fatigue.  Denies fever, chills, SOB, wheezing, chest pain, cough, vomiting, hematochezia, melena, changes in bladder habits.    Of note was seen by PCP recently and started on augmentin for possible sinus infection.  Reports improvement in sinus congestion.    ROS: As per HPI.  All other pertinent ROS negative.     Past Medical History:  Diagnosis Date  . Arthritis   . Back pain    chronic  . Bronchitis    history of  . CAD (coronary artery disease)   . Chronic back pain   . Chronic insomnia    Per medical history form dated 06/13/10.  . Chronic left hip pain   . Colitis    Per medical history from dated 06/13/10.  . Diverticulitis    Hx of; requiring 3 admissions  . Elevated liver enzymes   . Hypertension   . Hypothyroidism   . Left leg pain    chronic  . 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 Psoriasis    Per medical history form dated 06/13/10.  . Sigmoid colon ulcer    Rectal polyps  . Sleep apnea with use of continuous positive airway pressure (CPAP)    Past Surgical History:  Procedure Laterality Date  . APPENDECTOMY     8th grade  . BACK SURGERY    . CARDIAC CATHETERIZATION  2009  . CARDIAC CATHETERIZATION     Per medical history from dated 06/13/10.  . CERVICAL SPINE SURGERY     C4, C5, C6 spinal fusion  . CHOLECYSTECTOMY N/A 08/31/2015   Procedure: LAPAROSCOPIC CHOLECYSTECTOMY WITH INTRAOPERATIVE CHOLANGIOGRAM;  Surgeon: Excell Seltzer,  MD;  Location: [facility];  Service: General;  Laterality: N/A;  . COLONOSCOPY  07/2008   Colitis,NSAID v. Ischemia,malrotation of the gut,Diverticulosis(L),hyperplastic  . COLONOSCOPY N/A 12/13/2012   Dr. Oneida Alar: Normal TI, mild sigmoid diverticulosis, hemorrhoids, 2 polyps (tubular adenoma). Random colon bx negative. Next TCS 12/2022 with Fentanyl/phenergan  . ESOPHAGOGASTRODUODENOSCOPY N/A 04/16/2014   RMR: Erosive reflux esophagitis. Non critical Schzki's ring not manipulated. Small hiatal hernia. Abnormal gastirc mucosa of doubtful signigicance status post biopsy. I suspect trivial upper GI bleed. Recent abdominal pain presumably secondary to a protracted bout of diverticulitis. CT scan November 23 revealed improvemetn without complication. He finished his antibiotics 2 days age. Left sided ab  . JOINT REPLACEMENT    . LAPAROSCOPIC SIGMOID COLECTOMY  2012   Dr. Fanny Skates: recurrent sigmoid diverticulitis  . LAPAROSCOPIC SIGMOID COLECTOMY  2012   recurernt sigmoid diverticulitis, Dr. Dalbert Batman   . ROTATOR CUFF REPAIR     Left - per medical history form dated 06/13/10.  Marland Kitchen SHOULDER SURGERY     Left  . THYROIDECTOMY     Per medical history form dated 06/13/10.  Marland Kitchen TOTAL HIP ARTHROPLASTY  07/04/2011   Procedure: TOTAL HIP ARTHROPLASTY;  Surgeon: Johnny Bridge, MD;  Location: [facility];  Service: Orthopedics;  Laterality: Right;   Allergies  Allergen Reactions  . Other Hives and Shortness Of Breath    Cockroaches  . Iohexol Hives  . Shrimp [Shellfish Allergy] Hives  . Wheat Bran Hives  . Yeast-Related Products Hives   No current facility-administered medications on file prior to encounter.    Current Outpatient Medications on File Prior to Encounter  Medication Sig Dispense Refill  . diphenhydrAMINE (BENADRYL) 25 MG tablet Take 50 mg by mouth every 6 (six) hours as needed for allergies.     Marland Kitchen EPINEPHrine (EPIPEN) 0.3 mg/0.3 mL SOAJ Inject 0.3 mLs (0.3 mg total) into the muscle as needed. (Patient  taking differently: Inject 0.3 mg into the muscle daily as needed (allergic reaction). ) 1 Device 3  . escitalopram (LEXAPRO) 20 MG tablet Take 20 mg by mouth daily.    . hydrocortisone cream 1 % Apply 1 application topically once as needed for itching.    Marland Kitchen ibuprofen (ADVIL,MOTRIN) 600 MG tablet Take 1 tablet (600 mg total) by mouth every 6 (six) hours as needed. 30 tablet 0  . levothyroxine (SYNTHROID, LEVOTHROID) 300 MCG tablet Take 300 mcg by mouth daily.    . meclizine (ANTIVERT) 25 MG tablet Take 1 tablet (25 mg total) by mouth 3 (three) times daily as needed for dizziness. 30 tablet 0  . olmesartan (BENICAR) 20 MG tablet Take 20 mg by mouth daily.     Marland Kitchen oxyCODONE-acetaminophen (PERCOCET) 5-325 MG tablet Take 1-2 tablets by mouth every 6 (six) hours as needed for severe pain. 20 tablet 0  . pantoprazole (PROTONIX) 40 MG tablet TAKE 1 TABLET BY MOUTH ONCE DAILY 30 tablet 11  . zolpidem (AMBIEN) 10 MG tablet Take 10 mg by mouth at bedtime.     . [DISCONTINUED] promethazine (PHENERGAN) 25 MG tablet Take 1 tablet (25 mg total) by mouth every 6 (six) hours as needed for nausea or vomiting. 30 tablet 0   Social History   Socioeconomic History  . Marital status: Married    Spouse name: Not on file  . Number of children: Not on file  . Years of education: Not on file  . Highest education level: Not on file  Occupational History  . 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 Financial resource strain: Not on file  . Food insecurity    Worry: Not on file    Inability: Not on file  . Transportation needs    Medical: Not on file    Non-medical: Not on file  Tobacco Use  . Smoking status: Current Every Day Smoker    Packs/day: 0.50    Years: 18.00    Pack years: 9.00    Types: Cigarettes  . Smokeless tobacco: Never Used  . Tobacco comment:    Substance and Sexual Activity  . Alcohol use: No    Alcohol/week: 0.0 standard drinks  . Drug use: No  . Sexual activity: Not on file   Lifestyle  . Physical activity    Days per week: Not on file    Minutes per session: Not on file  . Stress: Not on file  Relationships  . Social Herbalist on phone: Not on file    Gets together: Not on file    Attends religious service: Not on file    Active member of club or organization: Not on file    Attends meetings of clubs or organizations: Not on file    Relationship status: Not on file  . Intimate partner violence    Fear  of current or ex partner: Not on file    Emotionally abused: Not on file    Physically abused: Not on file    Forced sexual activity: Not on file  Other Topics Concern  . 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   Married   Family History  Problem Relation Age of Onset  . Cancer Mother        breast cancer - per medical history form dated 06/13/10.  . Diverticulitis Mother   . Hypertension Mother   . Cancer Father        skin - per medical history form dated 06/13/10.  Marland Kitchen Hypertension Father   . Anesthesia problems Neg Hx   . Hypotension Neg Hx   . Malignant hyperthermia Neg Hx   . Pseudochol deficiency Neg Hx   . Colon cancer Neg Hx     OBJECTIVE:  Vitals:   01/07/19 1253  BP: (!) 144/96  Pulse: 89  Resp: 20  Temp: 98.5 F (36.9 C)     General appearance: alert; appears mildly fatigued, but nontoxic; speaking in full sentences and tolerating own secretions HEENT: NCAT; Ears: EACs clear, TMs pearly gray; Eyes: PERRL.  EOM grossly intact. Nose: nares patent without rhinorrhea, Throat: oropharynx clear, tonsils non erythematous or enlarged, uvula midline  Neck: supple without LAD Lungs: unlabored respirations, symmetrical air entry; cough: absent; no respiratory distress; CTAB Heart: regular rate and rhythm.  Radial pulses 2+ symmetrical bilaterally Abdomen: soft, nondistended, normal active bowel sounds; mildly TTP over LLQ; no guarding  Skin: warm and dry Psychological: alert and  cooperative; normal mood and affect  ASSESSMENT & PLAN:  1. Suspected Covid-19 Virus Infection   2. Diarrhea, unspecified type     Meds ordered this encounter  Medications  . ondansetron (ZOFRAN) 4 MG tablet    Sig: Take 1 tablet (4 mg total) by mouth every 6 (six) hours.    Dispense:  12 tablet    Refill:  0    Order Specific Question:   Supervising Provider    Answer:   Raylene Everts [id number]   COVID testing ordered.  It will take between 5- 7 days for results to return.  Someone will call you with abnormal results.    In the meantime: You should remain isolated in your home for 10 days from symptom onset AND greater than 72 hours after symptoms resolution (absence of fever without the use of fever-reducing medication and improvement in respiratory symptoms), whichever is longer Get plenty of rest and push fluids Use OTC medications like ibuprofen or tylenol as needed fever or pain Call or go to the ED if you have any new or worsening symptoms such as fever, worsening cough, shortness of breath, chest tightness, chest pain, turning blue, changes in mental status, etc...   Reviewed expectations re: course of current medical issues. Questions answered. Outlined signs and symptoms indicating need for more acute intervention. Patient verbalized understanding. After Visit Summary given.         Lestine Box, PA-C 01/07/19 1404

## 2019-01-07 NOTE — Discharge Instructions (Addendum)
COVID testing ordered.  It will take between 5- 7 days for results to return.  Someone will call you with abnormal results.    In the meantime: You should remain isolated in your home for 10 days from symptom onset AND greater than 72 hours after symptoms resolution (absence of fever without the use of fever-reducing medication and improvement in respiratory symptoms), whichever is longer Get plenty of rest and push fluids Zofran prescribed.  Take as needed for nausea Use OTC medications like ibuprofen or tylenol as needed fever or pain Call or go to the ED if you have any new or worsening symptoms such as fever, worsening cough, shortness of breath, chest tightness, chest pain, turning blue, changes in mental status, etc..Marland Kitchen

## 2019-01-08 LAB — NOVEL CORONAVIRUS, NAA: SARS-CoV-2, NAA: NOT DETECTED

## 2019-01-25 ENCOUNTER — Emergency Department (HOSPITAL_COMMUNITY): Payer: BC Managed Care – PPO

## 2019-01-25 ENCOUNTER — Encounter (HOSPITAL_COMMUNITY): Payer: Self-pay | Admitting: Emergency Medicine

## 2019-01-25 ENCOUNTER — Other Ambulatory Visit: Payer: Self-pay

## 2019-01-25 ENCOUNTER — Emergency Department (HOSPITAL_COMMUNITY)
Admission: EM | Admit: 2019-01-25 | Discharge: 2019-01-25 | Disposition: A | Discharge status: Home / Self Care | Payer: BC Managed Care – PPO | Attending: Emergency Medicine | Admitting: Emergency Medicine

## 2019-01-25 DIAGNOSIS — Z8673 Personal history of transient ischemic attack (TIA), and cerebral infarction without residual deficits: Secondary | ICD-10-CM | POA: Diagnosis not present

## 2019-01-25 DIAGNOSIS — F17210 Nicotine dependence, cigarettes, uncomplicated: Secondary | ICD-10-CM | POA: Diagnosis not present

## 2019-01-25 DIAGNOSIS — M545 Low back pain, unspecified: Secondary | ICD-10-CM

## 2019-01-25 DIAGNOSIS — I10 Essential (primary) hypertension: Secondary | ICD-10-CM | POA: Insufficient documentation

## 2019-01-25 DIAGNOSIS — I2510 Atherosclerotic heart disease of native coronary artery without angina pectoris: Secondary | ICD-10-CM | POA: Diagnosis not present

## 2019-01-25 DIAGNOSIS — Z79899 Other long term (current) drug therapy: Secondary | ICD-10-CM | POA: Diagnosis not present

## 2019-01-25 LAB — CBC WITH DIFFERENTIAL/PLATELET
Abs Immature Granulocytes: 0.12 10*3/uL — ABNORMAL HIGH (ref 0.00–0.07)
Basophils Absolute: 0.1 10*3/uL (ref 0.0–0.1)
Basophils Relative: 1 %
Eosinophils Absolute: 0.2 10*3/uL (ref 0.0–0.5)
Eosinophils Relative: 1 %
HCT: 48.9 % (ref 39.0–52.0)
Hemoglobin: 16.4 g/dL (ref 13.0–17.0)
Immature Granulocytes: 1 %
Lymphocytes Relative: 21 %
Lymphs Abs: 2.7 10*3/uL (ref 0.7–4.0)
MCH: 32.2 pg (ref 26.0–34.0)
MCHC: 33.5 g/dL (ref 30.0–36.0)
MCV: 96.1 fL (ref 80.0–100.0)
Monocytes Absolute: 1.1 10*3/uL — ABNORMAL HIGH (ref 0.1–1.0)
Monocytes Relative: 9 %
Neutro Abs: 8.3 10*3/uL — ABNORMAL HIGH (ref 1.7–7.7)
Neutrophils Relative %: 67 %
Platelets: 239 10*3/uL (ref 150–400)
RBC: 5.09 MIL/uL (ref 4.22–5.81)
RDW: 12.6 % (ref 11.5–15.5)
WBC: 12.4 10*3/uL — ABNORMAL HIGH (ref 4.0–10.5)
nRBC: 0 % (ref 0.0–0.2)

## 2019-01-25 LAB — COMPREHENSIVE METABOLIC PANEL
ALT: 37 U/L (ref 0–44)
AST: 23 U/L (ref 15–41)
Albumin: 3.9 g/dL (ref 3.5–5.0)
Alkaline Phosphatase: 83 U/L (ref 38–126)
Anion gap: 11 (ref 5–15)
BUN: 12 mg/dL (ref 6–20)
CO2: 23 mmol/L (ref 22–32)
Calcium: 8.9 mg/dL (ref 8.9–10.3)
Chloride: 104 mmol/L (ref 98–111)
Creatinine, Ser: 0.84 mg/dL (ref 0.61–1.24)
GFR calc Af Amer: >60 mL/min (ref >60)
GFR calc non Af Amer: >60 mL/min (ref >60)
Glucose, Bld: 97 mg/dL (ref 70–99)
Potassium: 3.6 mmol/L (ref 3.5–5.1)
Sodium: 138 mmol/L (ref 135–145)
Total Bilirubin: 0.4 mg/dL (ref 0.3–1.2)
Total Protein: 7.1 g/dL (ref 6.5–8.1)

## 2019-01-25 MED ORDER — HYDROMORPHONE HCL 1 MG/ML IJ SOLN
1.0000 mg | INTRAMUSCULAR | Status: AC
  Administered 2019-01-25 (×2): 1 mg via INTRAVENOUS
  Filled 2019-01-25 (×2): qty 1

## 2019-01-25 MED ORDER — HYDROMORPHONE HCL 1 MG/ML IJ SOLN
1.0000 mg | Freq: Once | INTRAMUSCULAR | Status: AC
  Administered 2019-01-25: 1 mg via INTRAVENOUS
  Filled 2019-01-25: qty 1

## 2019-01-25 MED ORDER — OXYCODONE-ACETAMINOPHEN 5-325 MG PO TABS
2.0000 | ORAL_TABLET | Freq: Once | ORAL | Status: AC
  Administered 2019-01-25: 2 via ORAL
  Filled 2019-01-25: qty 2

## 2019-01-25 MED ORDER — LORAZEPAM 2 MG/ML IJ SOLN
1.0000 mg | Freq: Once | INTRAMUSCULAR | Status: AC | PRN
  Administered 2019-01-25: 1 mg via INTRAVENOUS
  Filled 2019-01-25: qty 1

## 2019-01-25 MED ORDER — GADOBUTROL 1 MMOL/ML IV SOLN
10.0000 mL | Freq: Once | INTRAVENOUS | Status: AC | PRN
  Administered 2019-01-25: 10 mL via INTRAVENOUS

## 2019-01-25 MED ORDER — METHOCARBAMOL 500 MG PO TABS
1000.0000 mg | ORAL_TABLET | Freq: Once | ORAL | Status: AC
  Administered 2019-01-25: 1000 mg via ORAL
  Filled 2019-01-25: qty 2

## 2019-01-25 MED ORDER — OXYCODONE HCL 10 MG PO TABS: 5.0000 mg | ORAL_TABLET | Freq: Four times a day (QID) | ORAL | 0 refills | Status: DC | PRN

## 2019-01-25 MED ORDER — SODIUM CHLORIDE 0.9 % IV BOLUS
1000.0000 mL | Freq: Once | INTRAVENOUS | Status: AC
  Administered 2019-01-25: 1000 mL via INTRAVENOUS

## 2019-01-25 NOTE — ED Triage Notes (Signed)
Had back surgery on lower back on Wednesday.  Woke up this morning with severe pain to back that radiates down legs. Also reports a headache.  Pain medications are not helping the pain.

## 2019-01-25 NOTE — ED Notes (Signed)
Patient transported to MRI 

## 2019-01-25 NOTE — ED Notes (Signed)
Beeped through answering service to [phone].[place]

## 2019-01-25 NOTE — Discharge Instructions (Addendum)
Contact a health care provider if:  You have pain that is not relieved with rest or medicine.  You have increasing pain going down into your legs or buttocks.  Your pain does not improve after 2 weeks.  You have pain at night.  You lose weight without trying.  You have a fever or chills.  Get help right away if:  You develop new bowel or bladder control problems.  You have unusual weakness or numbness in your arms or legs.  You develop nausea or vomiting.  You develop abdominal pain.  You feel faint.

## 2019-01-25 NOTE — ED Provider Notes (Signed)
Patient transfer from [place]. Awaiting MRI for severe Backpain after recent lumbar surgery with Dr. Saintclair Halsted.  Patient MRI returned.  I personally reviewed the images.  Images discussed with Dr. Venetia Constable.  Can be admitted for pain control however he does not wish to be admitted.  I have discussed changing his pain regimen will discharge the patient with 10 mg oxycodone which he may take half or 1 whole every 6 hours for severe pain and follow closely with neurosurgery this coming Monday.  No evidence of infection.  Small seroma.  Patient is ambulatory with walker.  He appears appropriate for discharge at this time is a comfortable with plan.   Margarita Mail, PA-C 01/26/19 0024    Quintella Reichert, MD 01/27/19 1326

## 2019-01-25 NOTE — ED Notes (Signed)
Pt arrived pov.  Placed in hallway 20 to await MRI.

## 2019-01-25 NOTE — ED Notes (Addendum)
Patient leaving by PV to [facility] ER (ER to ER) for MRI. ER charge nurse, Roselyn Reef, aware.

## 2019-01-25 NOTE — ED Provider Notes (Signed)
[facility] EMERGENCY DEPARTMENT Provider Note   CSN: [id number] Arrival date & time: 01/25/19  1109     History   Chief Complaint Chief Complaint  Patient presents with   Back Pain    HPI AMIRALI CANFIELD is a 53 y.o. male.     HPI   Patient is a 53 year old male with a history of chronic back pain, CAD, diverticulitis, hypertension, hypothyroidism, OA, psoriasis, who presents to the emergency department today for evaluation of back pain.  Of note, patient had lumbar surgery 4 days ago with Dr. Tempie Donning.  States he has been taking prescribed pain medication however today day he woke up and had severe pain to his bilateral hips.  Patient also complains of pain radiating down the left lower extremity.  Also has some weakness to the left lower extremity and has some sensory changes to the left lower extremity.  Denies any loss of control of bowel or bladder function.  No urinary retention.  Has had some chills as well.  No documented fevers at home.  No chest pain, shortness of breath, coughing, abdominal pain nausea vomiting or diarrhea.  He does state that he has been constipated since the surgery.  He is complaining of urinary frequency but denies frequency or urgency.  Past Medical History:  Diagnosis Date   Arthritis    Back pain    chronic   Bronchitis    history of   CAD (coronary artery disease)    Chronic back pain    Chronic insomnia    Per medical history form dated 06/13/10.   Chronic left hip pain    Colitis    Per medical history from dated 06/13/10.   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   Sigmoid colon ulcer    Rectal polyps   Sleep apnea with use of continuous positive airway pressure (CPAP)     Patient Active Problem  List   Diagnosis Date Noted   LUQ pain 06/19/2016   Symptomatic cholelithiasis 08/30/2015   Preoperative cardiovascular examination    Coronary artery disease involving native coronary artery of native heart without angina pectoris    Gastroenteritis 08/26/2015   Cholelithiases 08/24/2015   Abdominal pain 08/24/2015   Cholecystitis, acute 08/24/2015   Diarrhea 08/24/2015   Acute cholecystitis 08/24/2015   Nausea without vomiting 04/08/2015   Diverticulitis of colon    Cerebral thrombosis with cerebral infarction ([place]) 03/21/2014   Stroke ([place]) 03/21/2014   Essential hypertension 03/21/2014   TIA (transient ischemic attack)    HLD (hyperlipidemia)    Thyroid activity decreased    Colitis 11/20/2012   CAD (coronary artery disease)    Hip pain 08/04/2011   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   CERVICAL SPINE SURGERY     C4, C5, C6 spinal fusion   CHOLECYSTECTOMY N/A 08/31/2015   Procedure: LAPAROSCOPIC CHOLECYSTECTOMY WITH INTRAOPERATIVE CHOLANGIOGRAM;  Surgeon: Excell Seltzer,  MD;  Location: [facility];  Service: General;  Laterality: N/A;   COLONOSCOPY  07/2008   Colitis,NSAID v. Ischemia,malrotation of the gut,Diverticulosis(L),hyperplastic   COLONOSCOPY N/A 12/13/2012   Dr. Oneida Alar: Normal TI, mild sigmoid diverticulosis, hemorrhoids, 2 polyps (tubular adenoma). Random colon bx negative. Next TCS 12/2022 with Fentanyl/phenergan   ESOPHAGOGASTRODUODENOSCOPY N/A 04/16/2014   RMR: Erosive reflux esophagitis. Non critical Schzki's ring not manipulated. Small hiatal hernia. Abnormal gastirc mucosa of doubtful signigicance status post biopsy. I suspect trivial upper GI bleed. Recent  abdominal pain presumably secondary to a protracted bout of diverticulitis. CT scan November 23 revealed improvemetn without complication. He finished his antibiotics 2 days age. Left sided ab   JOINT REPLACEMENT     LAPAROSCOPIC SIGMOID COLECTOMY  2012   Dr. Fanny Skates: recurrent sigmoid diverticulitis   LAPAROSCOPIC SIGMOID COLECTOMY  2012   recurernt sigmoid diverticulitis, Dr. Irish Elders CUFF REPAIR     Left - per medical history form dated 06/13/10.   SHOULDER SURGERY     Left   THYROIDECTOMY     Per medical history form dated 06/13/10.   TOTAL HIP ARTHROPLASTY  07/04/2011   Procedure: TOTAL HIP ARTHROPLASTY;  Surgeon: Johnny Bridge, MD;  Location: [facility];  Service: Orthopedics;  Laterality: Right;        Home Medications    Prior to Admission medications   Medication Sig Start Date End Date Taking? Authorizing Provider  diphenhydrAMINE (BENADRYL) 25 MG tablet Take 50 mg by mouth every 6 (six) hours as needed for allergies.    Yes [provider]  EPINEPHrine (EPIPEN) 0.3 mg/0.3 mL SOAJ Inject 0.3 mLs (0.3 mg total) into the muscle as needed. Patient taking differently: Inject 0.3 mg into the muscle daily as needed (allergic reaction).  12/01/12  Yes Teressa Lower, MD  hydrocortisone cream 1 % Apply 1 application topically once as needed for itching.   Yes [provider]  ibuprofen (ADVIL,MOTRIN) 600 MG tablet Take 1 tablet (600 mg total) by mouth every 6 (six) hours as needed. 07/15/17  Yes Noemi Chapel, MD  levothyroxine (SYNTHROID, LEVOTHROID) 300 MCG tablet Take 300 mcg by mouth daily. 03/26/14  Yes [provider]  olmesartan (BENICAR) 20 MG tablet Take 20 mg by mouth daily.    Yes [provider]  ondansetron (ZOFRAN) 4 MG tablet Take 1 tablet (4 mg total) by mouth every 6 (six) hours. 01/07/19  Yes Wurst, [place], PA-C  oxyCODONE-acetaminophen (PERCOCET) 5-325 MG tablet Take 1-2 tablets by mouth every 6 (six) hours as needed  for severe pain. 07/26/18  Yes Mesner, Corene Cornea, MD  pantoprazole (PROTONIX) 40 MG tablet TAKE 1 TABLET BY MOUTH ONCE DAILY 03/06/18  Yes Annitta Needs, NP  zolpidem (AMBIEN) 10 MG tablet Take 10 mg by mouth at bedtime.    Yes [provider]  meclizine (ANTIVERT) 25 MG tablet Take 1 tablet (25 mg total) by mouth 3 (three) times daily as needed for dizziness. Patient not taking: Reported on 01/25/2019 04/09/17   Isla Pence, MD  promethazine (PHENERGAN) 25 MG tablet Take 1 tablet (25 mg total) by mouth every 6 (six) hours as needed for nausea or vomiting. 07/20/17 01/07/19  Evalee Jefferson, PA-C    Family History Family History  Problem Relation Age of Onset   Cancer Mother        breast cancer - per medical history form dated 06/13/10.   Diverticulitis Mother    Hypertension Mother    Cancer Father  skin - per medical history form dated 06/13/10.   Hypertension Father    Anesthesia problems Neg Hx    Hypotension Neg Hx    Malignant hyperthermia Neg Hx    Pseudochol deficiency Neg Hx    Colon cancer Neg Hx     Social History Social History   Tobacco Use   Smoking status: Current Every Day Smoker    Packs/day: 0.50    Years: 18.00    Pack years: 9.00    Types: Cigarettes   Smokeless tobacco: Never Used   Tobacco comment:    Substance Use Topics   Alcohol use: No    Alcohol/week: 0.0 standard drinks   Drug use: No     Allergies   Other, Iohexol, Shrimp [shellfish allergy], Wheat bran, and Yeast-related products   Review of Systems Review of Systems  Constitutional: Negative for chills and fever.  HENT: Negative for ear pain and sore throat.   Eyes: Negative for visual disturbance.  Respiratory: Negative for cough and shortness of breath.   Cardiovascular: Negative for chest pain.  Gastrointestinal: Negative for abdominal pain, constipation, diarrhea, nausea and vomiting.  Genitourinary: Positive for frequency. Negative for dysuria, hematuria and  urgency.       No loss of control of bowel or bladder function  Musculoskeletal: Positive for back pain.  Skin: Negative for color change and rash.  Neurological: Positive for weakness and numbness. Seizures: paresthesias.  All other systems reviewed and are negative.    Physical Exam Updated Vital Signs BP (!) 145/90 (BP Location: Right Arm)    Pulse 78    Temp 98.2 F (36.8 C) (Oral)    Resp 16    Ht 6' (1.829 m)    Wt 106.1 kg    SpO2 94%    BMI 31.74 kg/m   Physical Exam Vitals signs and nursing note reviewed.  Constitutional:      Appearance: He is well-developed.  HENT:     Head: Normocephalic and atraumatic.  Eyes:     Conjunctiva/sclera: Conjunctivae normal.  Neck:     Musculoskeletal: Neck supple.  Cardiovascular:     Rate and Rhythm: Normal rate and regular rhythm.     Heart sounds: No murmur.  Pulmonary:     Effort: Pulmonary effort is normal. No respiratory distress.     Breath sounds: Normal breath sounds.  Abdominal:     General: Bowel sounds are normal. There is no distension.     Palpations: Abdomen is soft.     Tenderness: There is no abdominal tenderness. There is no guarding or rebound.  Musculoskeletal:     Comments: TTP to the lumbar spine over the incision site. Steri strips in place. No evidence of purulence drainage. No obvious surrounding erythema, induration, or fluctuance. TTP along the left hip/thigh area. No erythema, warmth or swelling the left hip/thigh  Skin:    General: Skin is warm and dry.  Neurological:     Mental Status: He is alert.     Comments: Strength slightly decreased to LLE, unclear if 2/2 to pain or if true weakness. Decreased sensation to the LLE.     ED Treatments / Results  Labs (all labs ordered are listed, but only abnormal results are displayed) Labs Reviewed  CBC WITH DIFFERENTIAL/PLATELET - Abnormal; Notable for the following components:      Result Value   WBC 12.4 (*)    Neutro Abs 8.3 (*)    Monocytes  Absolute 1.1 (*)  Abs Immature Granulocytes 0.12 (*)    All other components within normal limits  CULTURE, BLOOD (ROUTINE X 2)  CULTURE, BLOOD (ROUTINE X 2)  COMPREHENSIVE METABOLIC PANEL  URINALYSIS, ROUTINE W REFLEX MICROSCOPIC    EKG None  Radiology Ct Lumbar Spine Wo Contrast  Result Date: 01/25/2019 CLINICAL DATA:  Low back pain with left leg weakness and numbness. Lumbar surgery on 01/22/2019. EXAM: CT LUMBAR SPINE WITHOUT CONTRAST TECHNIQUE: Multidetector CT imaging of the lumbar spine was performed without intravenous contrast administration. Multiplanar CT image reconstructions were also generated. COMPARISON:  Outside lumbar spine MRI 01/10/2019 FINDINGS: Segmentation: The lowest fully formed intervertebral disc space is designated L5-S1. There are small ribs at L1. Alignment: Slight right convex curvature of the lumbar spine. No significant listhesis. Vertebrae: No fracture, suspicious osseous lesion, or evidence of discitis. Paraspinal and other soft tissues: Mild abdominal aortic atherosclerosis without aneurysm. Postoperative changes in the posterior lumbar soft tissues without evidence of a large fluid collection. Disc levels: T12-L1: Minimal spondylosis without evidence of stenosis. L1-2: Negative. L2-3: Mild facet hypertrophy without evidence of stenosis. L3-4: Left foraminotomy. Mild disc bulging and mild facet hypertrophy with suggestion of mild left lateral recess stenosis. No osseous neural foraminal stenosis. Endplate spurring and soft tissue in the inferior left neural foramen and left extraforaminal region with limited ability to differentiate residual/recurrent disc herniation from postoperative changes on CT. L4-5: Disc bulging eccentric to the left, endplate spurring, and mild facet hypertrophy result in mild-to-moderate left neural foraminal stenosis and likely mild left lateral recess stenosis, similar to the prior MRI. No spinal stenosis. L5-S1: Negative. IMPRESSION:  1. No acute osseous abnormality. 2. Left foraminotomy at L3-4 as above. Suspected mild left lateral recess stenosis. 3. Mild-to-moderate left neural foraminal and mild lateral recess stenosis at L4-5, similar to the prior MRI. 4. Aortic Atherosclerosis (ICD10-I70.0). Electronically Signed   By: Logan Bores M.D.   On: 01/25/2019 14:04    Procedures Procedures (including critical care time)  Medications Ordered in ED Medications  HYDROmorphone (DILAUDID) injection 1 mg (1 mg Intravenous Not Given 01/25/19 1540)  HYDROmorphone (DILAUDID) injection 1 mg (1 mg Intravenous Given 01/25/19 1311)  methocarbamol (ROBAXIN) tablet 1,000 mg (1,000 mg Oral Given 01/25/19 1330)  sodium chloride 0.9 % bolus 1,000 mL (0 mLs Intravenous Stopped 01/25/19 1526)  oxyCODONE-acetaminophen (PERCOCET/ROXICET) 5-325 MG per tablet 2 tablet (2 tablets Oral Given 01/25/19 1702)     Initial Impression / Assessment and Plan / ED Course  I have reviewed the triage vital signs and the nursing notes.  Pertinent labs & imaging results that were available during my care of the patient were reviewed by me and considered in my medical decision making (see chart for details).     Final Clinical Impressions(s) / ED Diagnoses   Final diagnoses:  Acute low back pain, unspecified back pain laterality, unspecified whether sciatica present   53 y/o male presenting with intractable back pain following lumbar spine surgery 4 days ago. Pain radiating to bilat hips and down LLE. Some LLE weakness and sensory changes noted. No loss of control of bowel or bladder function.   On arrival afebrile with normal VS. On exam slight weakness of the LLE noted with some sensory changes also noted.  Will obtain labs, UA, and give pain meds & muscle relaxers and discuss case with neurosurgery.   12:56 CONSULT with Dr. Zada Finders with neurosurgery. Discussed patient case and that MRI is unavailable at [facility] on the weekends. He recommends  obtaining  CT lumbar spine w/o contrast and treating patient's pain. States if CT scan unrevealing and pain improved he is comfortable with the patient being discharged home. He has lower concern for infection as he states it would unlikely occur this early in patient's post op course. If pt has abnormal CT scan or if pain is persistent despite meds given here, then he will likely need transfer to [facility] for MRI lumbar spine w/o contrast to assess for epidural hematoma.   CBC with mild leukocytosis CMP WNL UA pending at time of transfer Blood cultures obtained.  CT lumbar spine with no acute osseous abnormality. Left foraminotomy at L3-4 as above. Suspected mild left lateral recess stenosis. Mild-to-moderate left neural foraminal and mild lateral recess stenosis at L4-5, similar to the prior MRI.   On reassessment pt is still c/o intractable pain. He has had several doses of dilaudid and robaxin. Given prior conversation with neurosurgery and recommendation for transfer   Case discussed with Dr. Reather Converse who accepts patient for ED to ED transfer.   ED Discharge Orders    None       Bishop Dublin 01/25/19 1819    Milton Ferguson, MD 01/30/19 1054

## 2019-01-26 ENCOUNTER — Other Ambulatory Visit: Payer: Self-pay

## 2019-01-26 ENCOUNTER — Inpatient Hospital Stay (HOSPITAL_COMMUNITY)
Admission: EM | Admit: 2019-01-26 | Discharge: 2019-01-28 | DRG: 948 | Disposition: A | Discharge status: Home / Self Care | Payer: BC Managed Care – PPO | Attending: Neurosurgery | Admitting: Neurosurgery

## 2019-01-26 ENCOUNTER — Encounter (HOSPITAL_COMMUNITY): Payer: Self-pay | Admitting: *Deleted

## 2019-01-26 DIAGNOSIS — M1631 Unilateral osteoarthritis resulting from hip dysplasia, right hip: Secondary | ICD-10-CM | POA: Diagnosis present

## 2019-01-26 DIAGNOSIS — R350 Frequency of micturition: Secondary | ICD-10-CM | POA: Diagnosis present

## 2019-01-26 DIAGNOSIS — G4730 Sleep apnea, unspecified: Secondary | ICD-10-CM | POA: Diagnosis present

## 2019-01-26 DIAGNOSIS — G8929 Other chronic pain: Secondary | ICD-10-CM | POA: Diagnosis present

## 2019-01-26 DIAGNOSIS — F5104 Psychophysiologic insomnia: Secondary | ICD-10-CM | POA: Diagnosis present

## 2019-01-26 DIAGNOSIS — Z791 Long term (current) use of non-steroidal anti-inflammatories (NSAID): Secondary | ICD-10-CM

## 2019-01-26 DIAGNOSIS — I2510 Atherosclerotic heart disease of native coronary artery without angina pectoris: Secondary | ICD-10-CM | POA: Diagnosis present

## 2019-01-26 DIAGNOSIS — Z888 Allergy status to other drugs, medicaments and biological substances status: Secondary | ICD-10-CM

## 2019-01-26 DIAGNOSIS — Z808 Family history of malignant neoplasm of other organs or systems: Secondary | ICD-10-CM

## 2019-01-26 DIAGNOSIS — M25559 Pain in unspecified hip: Secondary | ICD-10-CM | POA: Diagnosis present

## 2019-01-26 DIAGNOSIS — Z8249 Family history of ischemic heart disease and other diseases of the circulatory system: Secondary | ICD-10-CM

## 2019-01-26 DIAGNOSIS — Z91013 Allergy to seafood: Secondary | ICD-10-CM

## 2019-01-26 DIAGNOSIS — Z20828 Contact with and (suspected) exposure to other viral communicable diseases: Secondary | ICD-10-CM | POA: Diagnosis present

## 2019-01-26 DIAGNOSIS — Z981 Arthrodesis status: Secondary | ICD-10-CM

## 2019-01-26 DIAGNOSIS — Z79890 Hormone replacement therapy (postmenopausal): Secondary | ICD-10-CM

## 2019-01-26 DIAGNOSIS — Z79899 Other long term (current) drug therapy: Secondary | ICD-10-CM

## 2019-01-26 DIAGNOSIS — Z803 Family history of malignant neoplasm of breast: Secondary | ICD-10-CM

## 2019-01-26 DIAGNOSIS — M25552 Pain in left hip: Secondary | ICD-10-CM | POA: Diagnosis present

## 2019-01-26 DIAGNOSIS — G8918 Other acute postprocedural pain: Secondary | ICD-10-CM

## 2019-01-26 DIAGNOSIS — Z8673 Personal history of transient ischemic attack (TIA), and cerebral infarction without residual deficits: Secondary | ICD-10-CM

## 2019-01-26 DIAGNOSIS — I10 Essential (primary) hypertension: Secondary | ICD-10-CM | POA: Diagnosis present

## 2019-01-26 DIAGNOSIS — Z96641 Presence of right artificial hip joint: Secondary | ICD-10-CM | POA: Diagnosis present

## 2019-01-26 DIAGNOSIS — M1612 Unilateral primary osteoarthritis, left hip: Secondary | ICD-10-CM | POA: Diagnosis present

## 2019-01-26 DIAGNOSIS — M5416 Radiculopathy, lumbar region: Secondary | ICD-10-CM | POA: Diagnosis present

## 2019-01-26 DIAGNOSIS — R531 Weakness: Secondary | ICD-10-CM | POA: Diagnosis present

## 2019-01-26 DIAGNOSIS — F17210 Nicotine dependence, cigarettes, uncomplicated: Secondary | ICD-10-CM | POA: Diagnosis present

## 2019-01-26 DIAGNOSIS — E039 Hypothyroidism, unspecified: Secondary | ICD-10-CM | POA: Diagnosis present

## 2019-01-26 LAB — URINALYSIS, ROUTINE W REFLEX MICROSCOPIC
Bilirubin Urine: NEGATIVE
Glucose, UA: NEGATIVE mg/dL
Hgb urine dipstick: NEGATIVE
Ketones, ur: NEGATIVE mg/dL
Leukocytes,Ua: NEGATIVE
Nitrite: NEGATIVE
Protein, ur: NEGATIVE mg/dL
Specific Gravity, Urine: 1.025 (ref 1.005–1.030)
pH: 5 (ref 5.0–8.0)

## 2019-01-26 LAB — CBC WITH DIFFERENTIAL/PLATELET
Abs Immature Granulocytes: 0.12 10*3/uL — ABNORMAL HIGH (ref 0.00–0.07)
Basophils Absolute: 0.1 10*3/uL (ref 0.0–0.1)
Basophils Relative: 1 %
Eosinophils Absolute: 0.1 10*3/uL (ref 0.0–0.5)
Eosinophils Relative: 2 %
HCT: 45.3 % (ref 39.0–52.0)
Hemoglobin: 16.5 g/dL (ref 13.0–17.0)
Immature Granulocytes: 1 %
Lymphocytes Relative: 26 %
Lymphs Abs: 2.3 10*3/uL (ref 0.7–4.0)
MCH: 34 pg (ref 26.0–34.0)
MCHC: 36.4 g/dL — ABNORMAL HIGH (ref 30.0–36.0)
MCV: 93.4 fL (ref 80.0–100.0)
Monocytes Absolute: 0.9 10*3/uL (ref 0.1–1.0)
Monocytes Relative: 10 %
Neutro Abs: 5.3 10*3/uL (ref 1.7–7.7)
Neutrophils Relative %: 60 %
Platelets: 223 10*3/uL (ref 150–400)
RBC: 4.85 MIL/uL (ref 4.22–5.81)
RDW: 12 % (ref 11.5–15.5)
WBC: 8.8 10*3/uL (ref 4.0–10.5)
nRBC: 0 % (ref 0.0–0.2)

## 2019-01-26 LAB — C-REACTIVE PROTEIN: CRP: 11 mg/dL — ABNORMAL HIGH (ref <1.0)

## 2019-01-26 MED ORDER — HYDROMORPHONE HCL 1 MG/ML IJ SOLN
1.0000 mg | Freq: Once | INTRAMUSCULAR | Status: AC
  Administered 2019-01-26: 1 mg via INTRAVENOUS
  Filled 2019-01-26: qty 1

## 2019-01-26 MED ORDER — ONDANSETRON HCL 4 MG/2ML IJ SOLN
4.0000 mg | Freq: Once | INTRAMUSCULAR | Status: AC
  Administered 2019-01-26: 4 mg via INTRAVENOUS
  Filled 2019-01-26: qty 2

## 2019-01-26 MED ORDER — PANTOPRAZOLE SODIUM 40 MG PO TBEC
40.0000 mg | DELAYED_RELEASE_TABLET | Freq: Every day | ORAL | Status: DC
  Administered 2019-01-26 – 2019-01-27 (×2): 40 mg via ORAL
  Filled 2019-01-26 (×2): qty 1

## 2019-01-26 MED ORDER — SODIUM CHLORIDE 0.9% FLUSH: 3.0000 mL | INTRAVENOUS | Status: DC | PRN

## 2019-01-26 MED ORDER — ACETAMINOPHEN 650 MG RE SUPP: 650.0000 mg | Freq: Four times a day (QID) | RECTAL | Status: DC | PRN

## 2019-01-26 MED ORDER — DIPHENHYDRAMINE HCL 25 MG PO CAPS: 25.0000 mg | ORAL_CAPSULE | Freq: Four times a day (QID) | ORAL | Status: DC | PRN

## 2019-01-26 MED ORDER — DEXAMETHASONE SODIUM PHOSPHATE 10 MG/ML IJ SOLN
10.0000 mg | Freq: Four times a day (QID) | INTRAMUSCULAR | Status: DC
  Administered 2019-01-27 – 2019-01-28 (×6): 10 mg via INTRAVENOUS
  Filled 2019-01-26 (×6): qty 1

## 2019-01-26 MED ORDER — MECLIZINE HCL 25 MG PO TABS
25.0000 mg | ORAL_TABLET | Freq: Three times a day (TID) | ORAL | Status: DC | PRN
  Filled 2019-01-26: qty 1

## 2019-01-26 MED ORDER — SODIUM CHLORIDE 0.9% FLUSH
3.0000 mL | Freq: Two times a day (BID) | INTRAVENOUS | Status: DC
  Administered 2019-01-27 (×2): 3 mL via INTRAVENOUS

## 2019-01-26 MED ORDER — IRBESARTAN 150 MG PO TABS
150.0000 mg | ORAL_TABLET | Freq: Every day | ORAL | Status: DC
  Administered 2019-01-26 – 2019-01-27 (×2): 150 mg via ORAL
  Filled 2019-01-26 (×3): qty 1

## 2019-01-26 MED ORDER — KETOROLAC TROMETHAMINE 30 MG/ML IJ SOLN
30.0000 mg | Freq: Four times a day (QID) | INTRAMUSCULAR | Status: DC | PRN
  Administered 2019-01-26 – 2019-01-27 (×3): 30 mg via INTRAVENOUS
  Filled 2019-01-26 (×3): qty 1

## 2019-01-26 MED ORDER — GABAPENTIN 600 MG PO TABS
300.0000 mg | ORAL_TABLET | Freq: Three times a day (TID) | ORAL | Status: DC
  Administered 2019-01-26 – 2019-01-27 (×4): 300 mg via ORAL
  Filled 2019-01-26 (×4): qty 1
  Filled 2019-01-26: qty 0.5

## 2019-01-26 MED ORDER — ONDANSETRON HCL 4 MG PO TABS
4.0000 mg | ORAL_TABLET | Freq: Four times a day (QID) | ORAL | Status: DC
  Administered 2019-01-26 – 2019-01-27 (×4): 4 mg via ORAL
  Filled 2019-01-26 (×4): qty 1

## 2019-01-26 MED ORDER — LEVOTHYROXINE SODIUM 100 MCG PO TABS
300.0000 ug | ORAL_TABLET | Freq: Every day | ORAL | Status: DC
  Administered 2019-01-26 – 2019-01-27 (×2): 300 ug via ORAL
  Filled 2019-01-26 (×2): qty 3

## 2019-01-26 MED ORDER — OXYCODONE HCL 5 MG PO TABS
5.0000 mg | ORAL_TABLET | Freq: Four times a day (QID) | ORAL | Status: DC | PRN
  Administered 2019-01-27 (×3): 10 mg via ORAL
  Administered 2019-01-27: 5 mg via ORAL
  Administered 2019-01-28: 10 mg via ORAL
  Filled 2019-01-26 (×3): qty 2
  Filled 2019-01-26 (×2): qty 1
  Filled 2019-01-26: qty 2

## 2019-01-26 MED ORDER — SODIUM CHLORIDE 0.9 % IV SOLN
250.0000 mL | INTRAVENOUS | Status: DC | PRN
  Administered 2019-01-27: 250 mL via INTRAVENOUS

## 2019-01-26 MED ORDER — ACETAMINOPHEN 325 MG PO TABS
650.0000 mg | ORAL_TABLET | Freq: Four times a day (QID) | ORAL | Status: DC | PRN
  Administered 2019-01-28: 650 mg via ORAL
  Filled 2019-01-26: qty 2

## 2019-01-26 MED ORDER — METHOCARBAMOL 500 MG PO TABS
1000.0000 mg | ORAL_TABLET | Freq: Four times a day (QID) | ORAL | Status: DC
  Administered 2019-01-26 – 2019-01-27 (×5): 1000 mg via ORAL
  Filled 2019-01-26 (×5): qty 2

## 2019-01-26 NOTE — ED Provider Notes (Signed)
[facility] EMERGENCY DEPARTMENT Provider Note   CSN: [id number] Arrival date & time: 01/26/19  1538     History   Chief Complaint Chief Complaint  Patient presents with   Back Pain    HPI Andrew Love is a 53 y.o. male CAD, hypertension, hypothyroidism, TIA, status post lumbar surgery 5 days ago by Dr. Saintclair Halsted.  He presented to emergency department yesterday for severe pain to his low back and hip.  He was sent to ED for MRI imaging.  MRI revealed some bulky circumferential dural thickening enhancement at L3 with some associated inflammation at the neural foramen.  There is also subcutaneous fluid collection overlying left L3 posterior elements favoring postop seroma.  Neurosurgery was consulted, Dr. Venetia Constable.  Patient was offered admission for pain control, however declined.  He was sent home with increased dose of pain medication of 10 mg of oxycodone.  He states he will get about 15 minutes of relief of pain and then he is back in tears with pain.  He called Dr. Venetia Constable today who instructed him to report to this hospital for direct admission, however upon arrival there was no documentation of this therefore he is here in the ED.  His pain is worse in his lower back and worsening in his left hip.  He states he was told that he had scar tissue that was likely causing radiation of pain down into his left hip and leg which was removed prior to the surgery and his pain is still persistent.  His pain is located to his left groin radiating to his inner thigh and worsening since yesterday.  He states he has had associated weakness to his left leg which was present yesterday as well.  He also endorses urinary frequency, similar to yesterday.  He denies bowel or bladder incontinence, fevers.  New numbness to his left lower leg that has changed from prior to surgery.     The history is provided by the patient and medical records.    Past Medical History:  Diagnosis Date    Arthritis    Back pain    chronic   Bronchitis    history of   CAD (coronary artery disease)    Chronic back pain    Chronic insomnia    Per medical history form dated 06/13/10.   Chronic left hip pain    Colitis    Per medical history from dated 06/13/10.   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   Sigmoid colon ulcer    Rectal polyps   Sleep apnea with use of continuous positive airway pressure (CPAP)     Patient Active Problem List   Diagnosis Date Noted   LUQ pain 06/19/2016   Symptomatic cholelithiasis 08/30/2015   Preoperative cardiovascular examination    Coronary artery disease involving native coronary artery of native heart without angina pectoris    Gastroenteritis 08/26/2015   Cholelithiases 08/24/2015   Abdominal pain 08/24/2015   Cholecystitis, acute 08/24/2015   Diarrhea 08/24/2015   Acute cholecystitis 08/24/2015   Nausea without vomiting 04/08/2015   Diverticulitis of colon    Cerebral thrombosis with cerebral infarction ([place]) 03/21/2014   Stroke ([place]) 03/21/2014   Essential hypertension 03/21/2014  TIA (transient ischemic attack)    HLD (hyperlipidemia)    Thyroid activity decreased    Colitis 11/20/2012   CAD (coronary artery disease)    Hip pain 08/04/2011   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   CERVICAL SPINE SURGERY     C4, C5, C6 spinal fusion   CHOLECYSTECTOMY N/A 08/31/2015   Procedure: LAPAROSCOPIC  CHOLECYSTECTOMY WITH INTRAOPERATIVE CHOLANGIOGRAM;  Surgeon: Excell Seltzer, MD;  Location: [facility];  Service: General;  Laterality: N/A;   COLONOSCOPY  07/2008   Colitis,NSAID v. Ischemia,malrotation of the gut,Diverticulosis(L),hyperplastic   COLONOSCOPY N/A 12/13/2012   Dr. Oneida Alar: Normal TI, mild sigmoid diverticulosis, hemorrhoids, 2 polyps (tubular adenoma). Random colon bx negative. Next TCS 12/2022 with Fentanyl/phenergan   ESOPHAGOGASTRODUODENOSCOPY N/A 04/16/2014   RMR: Erosive reflux esophagitis. Non critical Schzki's ring not manipulated. Small hiatal hernia. Abnormal gastirc mucosa of doubtful signigicance status post biopsy. I suspect trivial upper GI bleed. Recent abdominal pain presumably secondary to a protracted bout of diverticulitis. CT scan November 23 revealed improvemetn without complication. He finished his antibiotics 2 days age. Left sided ab   JOINT REPLACEMENT     LAPAROSCOPIC SIGMOID COLECTOMY  2012   Dr. Fanny Skates: recurrent sigmoid diverticulitis   LAPAROSCOPIC SIGMOID COLECTOMY  2012   recurernt sigmoid diverticulitis, Dr. Irish Elders CUFF REPAIR     Left - per medical history form dated 06/13/10.   SHOULDER SURGERY     Left   THYROIDECTOMY     Per medical history form dated 06/13/10.   TOTAL HIP ARTHROPLASTY  07/04/2011   Procedure: TOTAL HIP ARTHROPLASTY;  Surgeon: Johnny Bridge, MD;  Location: [facility];  Service: Orthopedics;  Laterality: Right;        Home Medications    Prior to Admission medications   Medication Sig Start Date End Date Taking? Authorizing Provider  diphenhydrAMINE (BENADRYL) 25 MG tablet Take 50 mg by mouth every 6 (six) hours as needed for allergies.     [provider]  EPINEPHrine (EPIPEN) 0.3 mg/0.3 mL SOAJ Inject 0.3 mLs (0.3 mg total) into the muscle as needed. Patient taking differently: Inject 0.3 mg into the muscle daily as needed (allergic reaction).  12/01/12   Teressa Lower, MD  hydrocortisone cream  1 % Apply 1 application topically once as needed for itching.    [provider]  ibuprofen (ADVIL,MOTRIN) 600 MG tablet Take 1 tablet (600 mg total) by mouth every 6 (six) hours as needed. 07/15/17   Noemi Chapel, MD  levothyroxine (SYNTHROID, LEVOTHROID) 300 MCG tablet Take 300 mcg by mouth daily. 03/26/14   [provider]  meclizine (ANTIVERT) 25 MG tablet Take 1 tablet (25 mg total) by mouth 3 (three) times daily as needed for dizziness. Patient not taking: Reported on 01/25/2019 04/09/17   Isla Pence, MD  olmesartan (BENICAR) 20 MG tablet Take 20 mg by mouth daily.     [provider]  ondansetron (ZOFRAN) 4 MG tablet Take 1 tablet (4 mg total) by mouth every 6 (six) hours. 01/07/19   Wurst, [place], PA-C  Oxycodone HCl 10 MG TABS Take 0.5-1 tablets (5-10 mg total) by mouth every 6 (six) hours  as needed for severe pain (Post operative pain). 01/25/19   Margarita Mail, PA-C  oxyCODONE-acetaminophen (PERCOCET) 5-325 MG tablet Take 1-2 tablets by mouth every 6 (six) hours as needed for severe pain. 07/26/18   Mesner, Corene Cornea, MD  pantoprazole (PROTONIX) 40 MG tablet TAKE 1 TABLET BY MOUTH ONCE DAILY 03/06/18   Annitta Needs, NP  zolpidem (AMBIEN) 10 MG tablet Take 10 mg by mouth at bedtime.     [provider]  promethazine (PHENERGAN) 25 MG tablet Take 1 tablet (25 mg total) by mouth every 6 (six) hours as needed for nausea or vomiting. 07/20/17 01/07/19  Evalee Jefferson, PA-C    Family History Family History  Problem Relation Age of Onset   Cancer Mother        breast cancer - per medical history form dated 06/13/10.   Diverticulitis Mother    Hypertension Mother    Cancer Father        skin - per medical history form dated 06/13/10.   Hypertension Father    Anesthesia problems Neg Hx    Hypotension Neg Hx    Malignant hyperthermia Neg Hx    Pseudochol deficiency Neg Hx    Colon cancer Neg Hx     Social History Social History   Tobacco Use    Smoking status: Current Every Day Smoker    Packs/day: 0.50    Years: 18.00    Pack years: 9.00    Types: Cigarettes   Smokeless tobacco: Never Used   Tobacco comment:    Substance Use Topics   Alcohol use: No    Alcohol/week: 0.0 standard drinks   Drug use: No     Allergies   Other, Iohexol, Shrimp [shellfish allergy], Wheat bran, and Yeast-related products   Review of Systems Review of Systems  Constitutional: Negative for fever.  Gastrointestinal: Negative for abdominal pain.  Genitourinary: Positive for frequency. Negative for difficulty urinating.  Musculoskeletal: Positive for arthralgias, back pain and myalgias.  Neurological: Positive for weakness.  All other systems reviewed and are negative.    Physical Exam Updated Vital Signs BP (!) 137/95 (BP Location: Right Arm)    Pulse 81    Temp 98.1 F (36.7 C) (Oral)    Resp 16    SpO2 96%   Physical Exam Vitals signs and nursing note reviewed.  Constitutional:      Appearance: He is well-developed.  HENT:     Head: Normocephalic and atraumatic.  Eyes:     Conjunctiva/sclera: Conjunctivae normal.  Cardiovascular:     Rate and Rhythm: Normal rate and regular rhythm.  Pulmonary:     Effort: Pulmonary effort is normal. No respiratory distress.     Breath sounds: Normal breath sounds.  Abdominal:     General: Bowel sounds are normal.     Palpations: Abdomen is soft.     Tenderness: There is no abdominal tenderness.  Musculoskeletal:     Comments: Removed bandage of the L-spine, however did not remove Steri-Strips.  There is no redness to the skin or obvious purulent drainage.  No large swelling appreciated. Patient is actively moving both extremities though is having increased pain with moving the left leg.  Skin:    General: Skin is warm.  Neurological:     Mental Status: He is alert.     Comments: Patient has some decreased sensation to the lateral left proximal thigh when compared to the right.   Sensation is otherwise intact with intact and equal PT pulses  bilaterally.  Psychiatric:        Behavior: Behavior normal.      ED Treatments / Results  Labs (all labs ordered are listed, but only abnormal results are displayed) Labs Reviewed  SARS CORONAVIRUS 2 (TAT 6-24 HRS)  URINALYSIS, ROUTINE W REFLEX MICROSCOPIC    EKG None  Radiology Ct Lumbar Spine Wo Contrast  Result Date: 01/25/2019 CLINICAL DATA:  Low back pain with left leg weakness and numbness. Lumbar surgery on 01/22/2019. EXAM: CT LUMBAR SPINE WITHOUT CONTRAST TECHNIQUE: Multidetector CT imaging of the lumbar spine was performed without intravenous contrast administration. Multiplanar CT image reconstructions were also generated. COMPARISON:  Outside lumbar spine MRI 01/10/2019 FINDINGS: Segmentation: The lowest fully formed intervertebral disc space is designated L5-S1. There are small ribs at L1. Alignment: Slight right convex curvature of the lumbar spine. No significant listhesis. Vertebrae: No fracture, suspicious osseous lesion, or evidence of discitis. Paraspinal and other soft tissues: Mild abdominal aortic atherosclerosis without aneurysm. Postoperative changes in the posterior lumbar soft tissues without evidence of a large fluid collection. Disc levels: T12-L1: Minimal spondylosis without evidence of stenosis. L1-2: Negative. L2-3: Mild facet hypertrophy without evidence of stenosis. L3-4: Left foraminotomy. Mild disc bulging and mild facet hypertrophy with suggestion of mild left lateral recess stenosis. No osseous neural foraminal stenosis. Endplate spurring and soft tissue in the inferior left neural foramen and left extraforaminal region with limited ability to differentiate residual/recurrent disc herniation from postoperative changes on CT. L4-5: Disc bulging eccentric to the left, endplate spurring, and mild facet hypertrophy result in mild-to-moderate left neural foraminal stenosis and likely mild left lateral  recess stenosis, similar to the prior MRI. No spinal stenosis. L5-S1: Negative. IMPRESSION: 1. No acute osseous abnormality. 2. Left foraminotomy at L3-4 as above. Suspected mild left lateral recess stenosis. 3. Mild-to-moderate left neural foraminal and mild lateral recess stenosis at L4-5, similar to the prior MRI. 4. Aortic Atherosclerosis (ICD10-I70.0). Electronically Signed   By: Logan Bores M.D.   On: 01/25/2019 14:04   Mr Lumbar Spine W Wo Contrast  Result Date: 01/25/2019 CLINICAL DATA:  53 year old male status post lumbar surgery 4 days ago, new severe pain radiating to the bilateral hips, left lower extremity with left side weakness today. EXAM: MRI LUMBAR SPINE WITHOUT AND WITH CONTRAST TECHNIQUE: Multiplanar and multiecho pulse sequences of the lumbar spine were obtained without and with intravenous contrast. CONTRAST:  8mL GADAVIST GADOBUTROL 1 MMOL/ML IV SOLN COMPARISON:  Lumbar spine CT [facility] 01/25/2019. FINDINGS: Segmentation:  Normal as on the comparison CT. Alignment:  Stable lumbar lordosis. Vertebrae: Postoperative changes to the left L3 posterior elements. No superimposed No marrow edema or evidence of acute osseous abnormality. Intact visible sacrum and SI joints. Conus medullaris and cauda equina: Conus extends to the L1-L2 level. No lower spinal cord or conus signal abnormality. Beginning at the L2 level, continuing inferiorly, and maximal at L3-L4 there is dural thickening and enhancement. This is circumferential, but left greater than right at L3-L4 as seen on series 11, image 21. The adjacent epidural fat remains normal, except at the left L3 neural foramen where there appears to be generalized soft tissue inflammation and some postoperative architectural distortion. No epidural fluid collection. The dural thickening tapers at L4 and appears resolved by L5. No superimposed thickening or enhancement of the cauda equina nerve roots is evident. Paraspinal and other soft  tissues: Postoperative paraspinal fluid collection posteriorly overlying the L3 and L4 levels encompasses 34 x 34 x 59 millimeters (AP  by transverse by CC) for an estimated volume of 34 milliliters. This is in the subcutaneous layer, subjacent to the incision, and tracks toward the left L3 posterior elements (series 11, image 28). The collection is mildly heterogeneous. There is superimposed bilateral subcutaneous edema. There is postoperative appearing enhancement along the left medial erector spinae muscles. Negative visible abdominal viscera. Disc levels: Negative L1-L2 and above. L2-L3: Mild spinal stenosis mostly related to epidural fat with superimposed dural thickening and enhancement. No foraminal stenosis. L3-L4: Moderate spinal stenosis related mostly to dural thickening and enhancement. Inflammation in the left L3 neural foramen which appears at least in part postoperative. Underlying circumferential disc bulge with endplate spurring and facet hypertrophy. L4-L5: Mild disc bulge and posterior element hypertrophy. Mild dural thickening. No significant spinal stenosis. Mild left L4 foraminal stenosis. L5-S1: Negative. IMPRESSION: 1. Recent surgery at L3 with bulky circumferential dural thickening and enhancement at that level (eccentric 2 the left) which tracks cephalad to L2 and caudal to L5. This is not a typical postoperative appearance, and raises the possibility of dural infection. There is no associated epidural fluid collection. But the dural abnormality results in moderate spinal stenosis at L3-L4, and mild at L2-L3. 2. Associated inflammation in the left L3 neural foramen which may all be postoperative in nature. 3. Subcutaneous fluid collection estimated at 34 mL overlying postoperative changes to the left L3 posterior elements. Favor postop seroma. 4. No lower spinal cord signal abnormality, and the cauda equina nerve roots appear to remain normal. Electronically Signed   By: Genevie Ann M.D.   On:  01/25/2019 21:25    Procedures Procedures (including critical care time)  Medications Ordered in ED Medications  HYDROmorphone (DILAUDID) injection 1 mg (1 mg Intravenous Given 01/26/19 1709)     Initial Impression / Assessment and Plan / ED Course  I have reviewed the triage vital signs and the nursing notes.  Pertinent labs & imaging results that were available during my care of the patient were reviewed by me and considered in my medical decision making (see chart for details).        Patient is 5 days postop lumbar surgery by Dr. Saintclair Halsted, presenting for uncontrolled pain.  Patient is complaining of some weakness in the left leg though seems to be unchanged from pre-operative baseline.  No new bowel or bladder incontinence, fever.  Wound does not appear infected on exam.  Pain medication ordered.  Discussed with Dr. Annette Stable on call today, who will evaluate patient and admit for further management.  Requests COVID test, however does not require repeat labs today.  Appreciate consult.  The patient appears reasonably stabilized for admission considering the current resources, flow, and capabilities available in the ED at this time, and I doubt any other [facility] requiring further screening and/or treatment in the ED prior to admission.  Final Clinical Impressions(s) / ED Diagnoses   Final diagnoses:  Acute post-operative pain    ED Discharge Orders    None       Sharian Delia, [place] N, PA-C 01/26/19 1711    Elnora Morrison, MD 01/26/19 2344

## 2019-01-26 NOTE — H&P (Signed)
Andrew Love is an 53 y.o. male.   Chief Complaint: Back and left hip pain HPI: 53 year old male approximately 5 days status post left-sided L3-4 redo extraforaminal microdiscectomy.  Patient initially did well after surgery until approximately 3 days postoperative when he developed severe left lower extremity recurrent hip and anterior lateral thigh pain.  Symptoms severe with any movement.  No right-sided symptoms.  No symptoms of weakness or bowel or bladder change.  Patient evaluated with a postoperative MRI scan yesterday which demonstrated no evidence of obvious infection, disc reherniation, or residual stenosis.  Patient attempted to manage his pain at home but has been unsuccessful.  Patient admitted now for pain control and observation.  Past Medical History:  Diagnosis Date  . Arthritis   . Back pain    chronic  . Bronchitis    history of  . CAD (coronary artery disease)   . Chronic back pain   . Chronic insomnia    Per medical history form dated 06/13/10.  . Chronic left hip pain   . Colitis    Per medical history from dated 06/13/10.  . Diverticulitis    Hx of; requiring 3 admissions  . Elevated liver enzymes   . Hypertension   . Hypothyroidism   . Left leg pain    chronic  . 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 Psoriasis    Per medical history form dated 06/13/10.  . Sigmoid colon ulcer    Rectal polyps  . Sleep apnea with use of continuous positive airway pressure (CPAP)     Past Surgical History:  Procedure Laterality Date  . APPENDECTOMY     8th grade  . BACK SURGERY    . CARDIAC CATHETERIZATION  2009  . CARDIAC CATHETERIZATION     Per medical history from dated 06/13/10.  . CERVICAL SPINE SURGERY     C4, C5, C6 spinal fusion  . CHOLECYSTECTOMY N/A 08/31/2015   Procedure: LAPAROSCOPIC CHOLECYSTECTOMY WITH INTRAOPERATIVE CHOLANGIOGRAM;  Surgeon: Excell Seltzer, MD;  Location: [facility];  Service: General;  Laterality: N/A;  .  COLONOSCOPY  07/2008   Colitis,NSAID v. Ischemia,malrotation of the gut,Diverticulosis(L),hyperplastic  . COLONOSCOPY N/A 12/13/2012   Dr. Oneida Alar: Normal TI, mild sigmoid diverticulosis, hemorrhoids, 2 polyps (tubular adenoma). Random colon bx negative. Next TCS 12/2022 with Fentanyl/phenergan  . ESOPHAGOGASTRODUODENOSCOPY N/A 04/16/2014   RMR: Erosive reflux esophagitis. Non critical Schzki's ring not manipulated. Small hiatal hernia. Abnormal gastirc mucosa of doubtful signigicance status post biopsy. I suspect trivial upper GI bleed. Recent abdominal pain presumably secondary to a protracted bout of diverticulitis. CT scan November 23 revealed improvemetn without complication. He finished his antibiotics 2 days age. Left sided ab  . JOINT REPLACEMENT    . LAPAROSCOPIC SIGMOID COLECTOMY  2012   Dr. Fanny Skates: recurrent sigmoid diverticulitis  . LAPAROSCOPIC SIGMOID COLECTOMY  2012   recurernt sigmoid diverticulitis, Dr. Dalbert Batman   . ROTATOR CUFF REPAIR     Left - per medical history form dated 06/13/10.  Marland Kitchen SHOULDER SURGERY     Left  . THYROIDECTOMY     Per medical history form dated 06/13/10.  Marland Kitchen TOTAL HIP ARTHROPLASTY  07/04/2011   Procedure: TOTAL HIP ARTHROPLASTY;  Surgeon: Johnny Bridge, MD;  Location: [facility];  Service: Orthopedics;  Laterality: Right;    Family History  Problem Relation Age of Onset  . Cancer Mother        breast cancer - per medical history form dated 06/13/10.  . Diverticulitis  Mother   . Hypertension Mother   . Cancer Father        skin - per medical history form dated 06/13/10.  Marland Kitchen Hypertension Father   . Anesthesia problems Neg Hx   . Hypotension Neg Hx   . Malignant hyperthermia Neg Hx   . Pseudochol deficiency Neg Hx   . Colon cancer Neg Hx    Social History:  reports that he has been smoking cigarettes. He has a 9.00 pack-year smoking history. He has never used smokeless tobacco. He reports that he does not drink alcohol or use drugs.  Allergies:  Allergies   Allergen Reactions  . Other Hives and Shortness Of Breath    Cockroaches  . Iohexol Hives  . Shrimp [Shellfish Allergy] Hives  . Wheat Bran Hives  . Yeast-Related Products Hives    (Not in a hospital admission)   Results for orders placed or performed during the hospital encounter of 01/25/19 (from the past 48 hour(s))  CBC with Differential     Status: Abnormal   Collection Time: 01/25/19  1:37 PM  Result Value Ref Range   WBC 12.4 (H) 4.0 - 10.5 K/uL   RBC 5.09 4.22 - 5.81 MIL/uL   Hemoglobin 16.4 13.0 - 17.0 g/dL   HCT 48.9 39.0 - 52.0 %   MCV 96.1 80.0 - 100.0 fL   MCH 32.2 26.0 - 34.0 pg   MCHC 33.5 30.0 - 36.0 g/dL   RDW 12.6 11.5 - 15.5 %   Platelets 239 150 - 400 K/uL   nRBC 0.0 0.0 - 0.2 %   Neutrophils Relative % 67 %   Neutro Abs 8.3 (H) 1.7 - 7.7 K/uL   Lymphocytes Relative 21 %   Lymphs Abs 2.7 0.7 - 4.0 K/uL   Monocytes Relative 9 %   Monocytes Absolute 1.1 (H) 0.1 - 1.0 K/uL   Eosinophils Relative 1 %   Eosinophils Absolute 0.2 0.0 - 0.5 K/uL   Basophils Relative 1 %   Basophils Absolute 0.1 0.0 - 0.1 K/uL   Immature Granulocytes 1 %   Abs Immature Granulocytes 0.12 (H) 0.00 - 0.07 K/uL    Comment: Performed at [facility], [address], [place], [place] [address]  Comprehensive metabolic panel     Status: None   Collection Time: 01/25/19  1:37 PM  Result Value Ref Range   Sodium 138 135 - 145 mmol/L   Potassium 3.6 3.5 - 5.1 mmol/L   Chloride 104 98 - 111 mmol/L   CO2 23 22 - 32 mmol/L   Glucose, Bld 97 70 - 99 mg/dL   BUN 12 6 - 20 mg/dL   Creatinine, Ser 0.84 0.61 - 1.24 mg/dL   Calcium 8.9 8.9 - 10.3 mg/dL   Total Protein 7.1 6.5 - 8.1 g/dL   Albumin 3.9 3.5 - 5.0 g/dL   AST 23 15 - 41 U/L   ALT 37 0 - 44 U/L   Alkaline Phosphatase 83 38 - 126 U/L   Total Bilirubin 0.4 0.3 - 1.2 mg/dL   GFR calc non Af Amer >60 >60 mL/min   GFR calc Af Amer >60 >60 mL/min   Anion gap 11 5 - 15    Comment: Performed at [facility], [address],  [place], [place] [address]  Blood culture (routine x 2)     Status: None (Preliminary result)   Collection Time: 01/25/19  1:37 PM   Specimen: Left Antecubital; Blood  Result Value Ref Range  Specimen Description      LEFT ANTECUBITAL BOTTLES DRAWN AEROBIC AND ANAEROBIC   Special Requests Blood Culture adequate volume    Culture      NO GROWTH < 24 HOURS Performed at [facility], [address] [place], [place] [address]    Report Status PENDING   Blood culture (routine x 2)     Status: None (Preliminary result)   Collection Time: 01/25/19  1:43 PM   Specimen: BLOOD RIGHT HAND  Result Value Ref Range   Specimen Description      BLOOD RIGHT HAND BOTTLES DRAWN AEROBIC AND ANAEROBIC   Special Requests Blood Culture adequate volume    Culture      NO GROWTH < 24 HOURS Performed at [facility], [address] [place], [place] [address]    Report Status PENDING    Ct Lumbar Spine Wo Contrast  Result Date: 01/25/2019 CLINICAL DATA:  Low back pain with left leg weakness and numbness. Lumbar surgery on 01/22/2019. EXAM: CT LUMBAR SPINE WITHOUT CONTRAST TECHNIQUE: Multidetector CT imaging of the lumbar spine was performed without intravenous contrast administration. Multiplanar CT image reconstructions were also generated. COMPARISON:  Outside lumbar spine MRI 01/10/2019 FINDINGS: Segmentation: The lowest fully formed intervertebral disc space is designated L5-S1. There are small ribs at L1. Alignment: Slight right convex curvature of the lumbar spine. No significant listhesis. Vertebrae: No fracture, suspicious osseous lesion, or evidence of discitis. Paraspinal and other soft tissues: Mild abdominal aortic atherosclerosis without aneurysm. Postoperative changes in the posterior lumbar soft tissues without evidence of a large fluid collection. Disc levels: T12-L1: Minimal spondylosis without evidence of stenosis. L1-2: Negative. L2-3: Mild facet hypertrophy without evidence of stenosis. L3-4: Left  foraminotomy. Mild disc bulging and mild facet hypertrophy with suggestion of mild left lateral recess stenosis. No osseous neural foraminal stenosis. Endplate spurring and soft tissue in the inferior left neural foramen and left extraforaminal region with limited ability to differentiate residual/recurrent disc herniation from postoperative changes on CT. L4-5: Disc bulging eccentric to the left, endplate spurring, and mild facet hypertrophy result in mild-to-moderate left neural foraminal stenosis and likely mild left lateral recess stenosis, similar to the prior MRI. No spinal stenosis. L5-S1: Negative. IMPRESSION: 1. No acute osseous abnormality. 2. Left foraminotomy at L3-4 as above. Suspected mild left lateral recess stenosis. 3. Mild-to-moderate left neural foraminal and mild lateral recess stenosis at L4-5, similar to the prior MRI. 4. Aortic Atherosclerosis (ICD10-I70.0). Electronically Signed   By: Logan Bores M.D.   On: 01/25/2019 14:04   Mr Lumbar Spine W Wo Contrast  Result Date: 01/25/2019 CLINICAL DATA:  53 year old male status post lumbar surgery 4 days ago, new severe pain radiating to the bilateral hips, left lower extremity with left side weakness today. EXAM: MRI LUMBAR SPINE WITHOUT AND WITH CONTRAST TECHNIQUE: Multiplanar and multiecho pulse sequences of the lumbar spine were obtained without and with intravenous contrast. CONTRAST:  73mL GADAVIST GADOBUTROL 1 MMOL/ML IV SOLN COMPARISON:  Lumbar spine CT [facility] 01/25/2019. FINDINGS: Segmentation:  Normal as on the comparison CT. Alignment:  Stable lumbar lordosis. Vertebrae: Postoperative changes to the left L3 posterior elements. No superimposed No marrow edema or evidence of acute osseous abnormality. Intact visible sacrum and SI joints. Conus medullaris and cauda equina: Conus extends to the L1-L2 level. No lower spinal cord or conus signal abnormality. Beginning at the L2 level, continuing inferiorly, and maximal at L3-L4  there is dural thickening and enhancement. This is circumferential, but left greater than  right at L3-L4 as seen on series 11, image 21. The adjacent epidural fat remains normal, except at the left L3 neural foramen where there appears to be generalized soft tissue inflammation and some postoperative architectural distortion. No epidural fluid collection. The dural thickening tapers at L4 and appears resolved by L5. No superimposed thickening or enhancement of the cauda equina nerve roots is evident. Paraspinal and other soft tissues: Postoperative paraspinal fluid collection posteriorly overlying the L3 and L4 levels encompasses 34 x 34 x 59 millimeters (AP by transverse by CC) for an estimated volume of 34 milliliters. This is in the subcutaneous layer, subjacent to the incision, and tracks toward the left L3 posterior elements (series 11, image 28). The collection is mildly heterogeneous. There is superimposed bilateral subcutaneous edema. There is postoperative appearing enhancement along the left medial erector spinae muscles. Negative visible abdominal viscera. Disc levels: Negative L1-L2 and above. L2-L3: Mild spinal stenosis mostly related to epidural fat with superimposed dural thickening and enhancement. No foraminal stenosis. L3-L4: Moderate spinal stenosis related mostly to dural thickening and enhancement. Inflammation in the left L3 neural foramen which appears at least in part postoperative. Underlying circumferential disc bulge with endplate spurring and facet hypertrophy. L4-L5: Mild disc bulge and posterior element hypertrophy. Mild dural thickening. No significant spinal stenosis. Mild left L4 foraminal stenosis. L5-S1: Negative. IMPRESSION: 1. Recent surgery at L3 with bulky circumferential dural thickening and enhancement at that level (eccentric 2 the left) which tracks cephalad to L2 and caudal to L5. This is not a typical postoperative appearance, and raises the possibility of dural  infection. There is no associated epidural fluid collection. But the dural abnormality results in moderate spinal stenosis at L3-L4, and mild at L2-L3. 2. Associated inflammation in the left L3 neural foramen which may all be postoperative in nature. 3. Subcutaneous fluid collection estimated at 34 mL overlying postoperative changes to the left L3 posterior elements. Favor postop seroma. 4. No lower spinal cord signal abnormality, and the cauda equina nerve roots appear to remain normal. Electronically Signed   By: Genevie Ann M.D.   On: 01/25/2019 21:25    Pertinent items noted in HPI and remainder of comprehensive ROS otherwise negative.  Blood pressure (!) 137/95, pulse 81, temperature 98.1 F (36.7 C), temperature source Oral, resp. rate 16, SpO2 96 %.  Patient is awake and alert.  He is oriented and appropriate.  He is obviously very uncomfortable.  He does not appear to be toxic.  Examination of his head ears eyes nose and throat is unremarked.  Neck is supple.  Carotid pulses are normal bilateral.  Airway is midline bilaterally.  Chest and abdomen are benign.  Extremities are free from injury or deformity.  Surgical wound is healing well.  No evidence of discharge or significant erythema.  Generalized tenderness not out of the ordinary is present.  Patient with marked tenderness around his left hip girdle.  Pain with straight leg raising on the left.  Pain with external rotation of his left hip.  Motor examination with poor effort but motor groups all appear to be working in both lower extremities.  Sensory examination nonfocal. Assessment/Plan Severe postoperative radiculitis.  Symptoms likely secondary to postoperative inflammation.  MRI scanning done yesterday did raise the question of some dural thickening and inflammation.  I do know if this is just because of his prior surgery or not.  We will send a sedimentation rate and C-reactive protein test his inflammatory markers.  Patient will be started  on IV steroids and Neurontin.  Dr. Saintclair Halsted to assume care in the morning.  Cooper Render Charlisa Cham 01/26/2019, 5:40 PM

## 2019-01-26 NOTE — ED Triage Notes (Signed)
Central low back pain and in the left hip. Pt was seen yesterday for the same, discussed admission for pain control, pt did not want at the time. Pt says that the pain is too bad now. Had back surgery on wed morning.  Last pain meds around 1420, took oxy, robaxin, and ibuprofen.

## 2019-01-27 ENCOUNTER — Inpatient Hospital Stay (HOSPITAL_COMMUNITY): Payer: BC Managed Care – PPO

## 2019-01-27 DIAGNOSIS — Z888 Allergy status to other drugs, medicaments and biological substances status: Secondary | ICD-10-CM | POA: Diagnosis not present

## 2019-01-27 DIAGNOSIS — M5416 Radiculopathy, lumbar region: Secondary | ICD-10-CM | POA: Diagnosis present

## 2019-01-27 DIAGNOSIS — Z803 Family history of malignant neoplasm of breast: Secondary | ICD-10-CM | POA: Diagnosis not present

## 2019-01-27 DIAGNOSIS — Z96641 Presence of right artificial hip joint: Secondary | ICD-10-CM | POA: Diagnosis present

## 2019-01-27 DIAGNOSIS — Z79890 Hormone replacement therapy (postmenopausal): Secondary | ICD-10-CM | POA: Diagnosis not present

## 2019-01-27 DIAGNOSIS — M25552 Pain in left hip: Secondary | ICD-10-CM | POA: Diagnosis present

## 2019-01-27 DIAGNOSIS — I10 Essential (primary) hypertension: Secondary | ICD-10-CM | POA: Diagnosis present

## 2019-01-27 DIAGNOSIS — Z8249 Family history of ischemic heart disease and other diseases of the circulatory system: Secondary | ICD-10-CM | POA: Diagnosis not present

## 2019-01-27 DIAGNOSIS — Z808 Family history of malignant neoplasm of other organs or systems: Secondary | ICD-10-CM | POA: Diagnosis not present

## 2019-01-27 DIAGNOSIS — I2510 Atherosclerotic heart disease of native coronary artery without angina pectoris: Secondary | ICD-10-CM | POA: Diagnosis present

## 2019-01-27 DIAGNOSIS — R531 Weakness: Secondary | ICD-10-CM | POA: Diagnosis present

## 2019-01-27 DIAGNOSIS — F5104 Psychophysiologic insomnia: Secondary | ICD-10-CM | POA: Diagnosis present

## 2019-01-27 DIAGNOSIS — M1612 Unilateral primary osteoarthritis, left hip: Secondary | ICD-10-CM | POA: Diagnosis present

## 2019-01-27 DIAGNOSIS — G4730 Sleep apnea, unspecified: Secondary | ICD-10-CM | POA: Diagnosis present

## 2019-01-27 DIAGNOSIS — Z91013 Allergy to seafood: Secondary | ICD-10-CM | POA: Diagnosis not present

## 2019-01-27 DIAGNOSIS — M1631 Unilateral osteoarthritis resulting from hip dysplasia, right hip: Secondary | ICD-10-CM | POA: Diagnosis present

## 2019-01-27 DIAGNOSIS — R350 Frequency of micturition: Secondary | ICD-10-CM | POA: Diagnosis present

## 2019-01-27 DIAGNOSIS — E039 Hypothyroidism, unspecified: Secondary | ICD-10-CM | POA: Diagnosis present

## 2019-01-27 DIAGNOSIS — Z8673 Personal history of transient ischemic attack (TIA), and cerebral infarction without residual deficits: Secondary | ICD-10-CM | POA: Diagnosis not present

## 2019-01-27 DIAGNOSIS — Z981 Arthrodesis status: Secondary | ICD-10-CM | POA: Diagnosis not present

## 2019-01-27 DIAGNOSIS — G8929 Other chronic pain: Secondary | ICD-10-CM | POA: Diagnosis present

## 2019-01-27 DIAGNOSIS — F17210 Nicotine dependence, cigarettes, uncomplicated: Secondary | ICD-10-CM | POA: Diagnosis present

## 2019-01-27 DIAGNOSIS — G8918 Other acute postprocedural pain: Secondary | ICD-10-CM | POA: Diagnosis present

## 2019-01-27 DIAGNOSIS — Z20828 Contact with and (suspected) exposure to other viral communicable diseases: Secondary | ICD-10-CM | POA: Diagnosis present

## 2019-01-27 LAB — BASIC METABOLIC PANEL WITH GFR
Anion gap: 11 (ref 5–15)
BUN: 14 mg/dL (ref 6–20)
CO2: 26 mmol/L (ref 22–32)
Calcium: 8.9 mg/dL (ref 8.9–10.3)
Chloride: 99 mmol/L (ref 98–111)
Creatinine, Ser: 1.07 mg/dL (ref 0.61–1.24)
GFR calc Af Amer: >60 mL/min
GFR calc non Af Amer: >60 mL/min
Glucose, Bld: 133 mg/dL — ABNORMAL HIGH (ref 70–99)
Potassium: 3.9 mmol/L (ref 3.5–5.1)
Sodium: 136 mmol/L (ref 135–145)

## 2019-01-27 LAB — HIV ANTIBODY (ROUTINE TESTING W REFLEX): HIV Screen 4th Generation wRfx: NONREACTIVE

## 2019-01-27 LAB — CBC
HCT: 45.3 % (ref 39.0–52.0)
Hemoglobin: 16 g/dL (ref 13.0–17.0)
MCH: 33.2 pg (ref 26.0–34.0)
MCHC: 35.3 g/dL (ref 30.0–36.0)
MCV: 94 fL (ref 80.0–100.0)
Platelets: 227 10*3/uL (ref 150–400)
RBC: 4.82 MIL/uL (ref 4.22–5.81)
RDW: 12.1 % (ref 11.5–15.5)
WBC: 9.1 10*3/uL (ref 4.0–10.5)
nRBC: 0 % (ref 0.0–0.2)

## 2019-01-27 LAB — SEDIMENTATION RATE: Sed Rate: 16 mm/h (ref 0–16)

## 2019-01-27 LAB — SARS CORONAVIRUS 2 (TAT 6-24 HRS): SARS Coronavirus 2: NEGATIVE

## 2019-01-27 MED ORDER — LORAZEPAM 2 MG/ML IJ SOLN
1.0000 mg | Freq: Once | INTRAMUSCULAR | Status: AC
  Administered 2019-01-27: 19:00:00 1 mg via INTRAVENOUS
  Filled 2019-01-27: qty 1

## 2019-01-27 MED ORDER — LORAZEPAM 0.5 MG PO TABS: 1.0000 mg | ORAL_TABLET | Freq: Once | ORAL | Status: AC

## 2019-01-27 NOTE — ED Notes (Signed)
MS 

## 2019-01-27 NOTE — ED Notes (Signed)
MRI notified of pt new room assignment; and currently no new order for premedication at this time.

## 2019-01-27 NOTE — ED Notes (Signed)
Lunch ordered 

## 2019-01-27 NOTE — ED Notes (Signed)
ED TO INPATIENT HANDOFF REPORT  ED Nurse Name and Phone #: William Hamburger, [place]  S Name/Age/Gender Andrew Love 53 y.o. male Room/Bed: 057C/057C  Code Status   Code Status: Full Code  Home/SNF/Other Home Patient oriented to: self, place, time and situation Is this baseline? Yes   Triage Complete: Triage complete  Chief Complaint sent by PCP back pain from surgery  Triage Note Central low back pain and in the left hip. Pt was seen yesterday for the same, discussed admission for pain control, pt did not want at the time. Pt says that the pain is too bad now. Had back surgery on wed morning.  Last pain meds around 1420, took oxy, robaxin, and ibuprofen.    Allergies Allergies  Allergen Reactions  . Other Hives and Shortness Of Breath    Cockroaches  . Iohexol Hives  . Shrimp [Shellfish Allergy] Hives  . Wheat Bran Hives  . Yeast-Related Products Hives    Level of Care/Admitting Diagnosis ED Disposition    ED Disposition Condition Comment   Admit  Hospital Area: [facility] [[id number]]  Level of Care: Med-Surg [16]  Covid Evaluation: Confirmed COVID Negative  Diagnosis: Hip pain [id number]  Admitting Physician: Roxana Hires  Attending Physician: Kary Kos [[id number]]  Estimated length of stay: past midnight tomorrow  Certification:: I certify there are rare and unusual circumstances requiring inpatient admission  Bed request comments: 3C  PT Class (Do Not Modify): Inpatient [101]  PT Acc Code (Do Not Modify): Private [1]       B Medical/Surgery History Past Medical History:  Diagnosis Date  . Arthritis   . Back pain    chronic  . Bronchitis    history of  . CAD (coronary artery disease)   . Chronic back pain   . Chronic insomnia    Per medical history form dated 06/13/10.  . Chronic left hip pain   . Colitis    Per medical history from dated 06/13/10.  . Diverticulitis    Hx of; requiring 3 admissions  . Elevated liver enzymes   .  Hypertension   . Hypothyroidism   . Left leg pain    chronic  . 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 Psoriasis    Per medical history form dated 06/13/10.  . Sigmoid colon ulcer    Rectal polyps  . Sleep apnea with use of continuous positive airway pressure (CPAP)    Past Surgical History:  Procedure Laterality Date  . APPENDECTOMY     8th grade  . BACK SURGERY    . CARDIAC CATHETERIZATION  2009  . CARDIAC CATHETERIZATION     Per medical history from dated 06/13/10.  . CERVICAL SPINE SURGERY     C4, C5, C6 spinal fusion  . CHOLECYSTECTOMY N/A 08/31/2015   Procedure: LAPAROSCOPIC CHOLECYSTECTOMY WITH INTRAOPERATIVE CHOLANGIOGRAM;  Surgeon: Excell Seltzer, MD;  Location: [facility];  Service: General;  Laterality: N/A;  . COLONOSCOPY  07/2008   Colitis,NSAID v. Ischemia,malrotation of the gut,Diverticulosis(L),hyperplastic  . COLONOSCOPY N/A 12/13/2012   Dr. Oneida Alar: Normal TI, mild sigmoid diverticulosis, hemorrhoids, 2 polyps (tubular adenoma). Random colon bx negative. Next TCS 12/2022 with Fentanyl/phenergan  . ESOPHAGOGASTRODUODENOSCOPY N/A 04/16/2014   RMR: Erosive reflux esophagitis. Non critical Schzki's ring not manipulated. Small hiatal hernia. Abnormal gastirc mucosa of doubtful signigicance status post biopsy. I suspect trivial upper GI bleed. Recent abdominal pain presumably secondary to a protracted bout of diverticulitis. CT scan November 23 revealed  improvemetn without complication. He finished his antibiotics 2 days age. Left sided ab  . JOINT REPLACEMENT    . LAPAROSCOPIC SIGMOID COLECTOMY  2012   Dr. Fanny Skates: recurrent sigmoid diverticulitis  . LAPAROSCOPIC SIGMOID COLECTOMY  2012   recurernt sigmoid diverticulitis, Dr. Dalbert Batman   . ROTATOR CUFF REPAIR     Left - per medical history form dated 06/13/10.  Marland Kitchen SHOULDER SURGERY     Left  . THYROIDECTOMY     Per medical history form dated 06/13/10.  Marland Kitchen TOTAL HIP ARTHROPLASTY  07/04/2011   Procedure:  TOTAL HIP ARTHROPLASTY;  Surgeon: Johnny Bridge, MD;  Location: [facility];  Service: Orthopedics;  Laterality: Right;     A IV Location/Drains/Wounds Patient Lines/Drains/Airways Status   Active Line/Drains/Airways    Name:   Placement date:   Placement time:   Site:   Days:   Peripheral IV 01/26/19 Right Forearm   01/26/19    1702    Forearm   1          Intake/Output Last 24 hours No intake or output data in the 24 hours ending 01/27/19 1531  Labs/Imaging Results for orders placed or performed during the hospital encounter of 01/26/19 (from the past 48 hour(s))  Urinalysis, Routine w reflex microscopic     Status: None   Collection Time: 01/25/19 10:24 PM  Result Value Ref Range   Color, Urine YELLOW YELLOW   APPearance CLEAR CLEAR   Specific Gravity, Urine 1.025 1.005 - 1.030   pH 5.0 5.0 - 8.0   Glucose, UA NEGATIVE NEGATIVE mg/dL   Hgb urine dipstick NEGATIVE NEGATIVE   Bilirubin Urine NEGATIVE NEGATIVE   Ketones, ur NEGATIVE NEGATIVE mg/dL   Protein, ur NEGATIVE NEGATIVE mg/dL   Nitrite NEGATIVE NEGATIVE   Leukocytes,Ua NEGATIVE NEGATIVE    Comment: Performed at [facility], [address], [place], [place] [address]  SARS CORONAVIRUS 2 (TAT 6-24 HRS) Nasopharyngeal Nasopharyngeal Swab     Status: None   Collection Time: 01/26/19  4:54 PM   Specimen: Nasopharyngeal Swab  Result Value Ref Range   SARS Coronavirus 2 NEGATIVE NEGATIVE    Comment: (NOTE) SARS-CoV-2 target nucleic acids are NOT DETECTED. The SARS-CoV-2 RNA is generally detectable in upper and lower respiratory specimens during the acute phase of infection. Negative results do not preclude SARS-CoV-2 infection, do not rule out co-infections with other pathogens, and should not be used as the sole basis for treatment or other patient management decisions. Negative results must be combined with clinical observations, patient history, and epidemiological information. The expected result is  Negative. Fact Sheet for Patients: SugarRoll.be Fact Sheet for Healthcare Providers: https://www.woods-mathews.com/ This test is not yet approved or cleared by the [place] FDA and  has been authorized for detection and/or diagnosis of SARS-CoV-2 by FDA under an Emergency Use Authorization (EUA). This EUA will remain  in effect (meaning this test can be used) for the duration of the COVID-19 declaration under Section 56 4(b)(1) of the Act, 21 U.S.C. section 360bbb-3(b)(1), unless the authorization is terminated or revoked sooner. Performed at [facility], [place] [address] [place], [place] [address]   HIV antibody (Routine Testing)     Status: None   Collection Time: 01/26/19 10:30 PM  Result Value Ref Range   HIV Screen 4th Generation wRfx Non Reactive Non Reactive    Comment: (NOTE) Performed At: [facility] [address] [place], [place] [id number] Rush Farmer MD [id number]  CBC WITH DIFFERENTIAL     Status: Abnormal   Collection Time: 01/26/19 10:30 PM  Result Value Ref Range   WBC 8.8 4.0 - 10.5 K/uL   RBC 4.85 4.22 - 5.81 MIL/uL   Hemoglobin 16.5 13.0 - 17.0 g/dL   HCT 45.3 39.0 - 52.0 %   MCV 93.4 80.0 - 100.0 fL   MCH 34.0 26.0 - 34.0 pg   MCHC 36.4 (H) 30.0 - 36.0 g/dL   RDW 12.0 11.5 - 15.5 %   Platelets 223 150 - 400 K/uL   nRBC 0.0 0.0 - 0.2 %   Neutrophils Relative % 60 %   Neutro Abs 5.3 1.7 - 7.7 K/uL   Lymphocytes Relative 26 %   Lymphs Abs 2.3 0.7 - 4.0 K/uL   Monocytes Relative 10 %   Monocytes Absolute 0.9 0.1 - 1.0 K/uL   Eosinophils Relative 2 %   Eosinophils Absolute 0.1 0.0 - 0.5 K/uL   Basophils Relative 1 %   Basophils Absolute 0.1 0.0 - 0.1 K/uL   Immature Granulocytes 1 %   Abs Immature Granulocytes 0.12 (H) 0.00 - 0.07 K/uL    Comment: Performed at [facility], [address] [place], [place] [address]  Sedimentation rate     Status: None   Collection Time:  01/26/19 10:30 PM  Result Value Ref Range   Sed Rate 16 0 - 16 mm/hr    Comment: Performed at [facility] [address] [place], [place] [address]  C-reactive protein     Status: Abnormal   Collection Time: 01/26/19 10:30 PM  Result Value Ref Range   CRP 11.0 (H) <1.0 mg/dL    Comment: Performed at [facility] [address] [place], [place] [id number]  Basic metabolic panel     Status: Abnormal   Collection Time: 01/27/19  2:07 AM  Result Value Ref Range   Sodium 136 135 - 145 mmol/L   Potassium 3.9 3.5 - 5.1 mmol/L   Chloride 99 98 - 111 mmol/L   CO2 26 22 - 32 mmol/L   Glucose, Bld 133 (H) 70 - 99 mg/dL   BUN 14 6 - 20 mg/dL   Creatinine, Ser 1.07 0.61 - 1.24 mg/dL   Calcium 8.9 8.9 - 10.3 mg/dL   GFR calc non Af Amer >60 >60 mL/min   GFR calc Af Amer >60 >60 mL/min   Anion gap 11 5 - 15    Comment: Performed at [facility] [address], [place] [address]  CBC     Status: None   Collection Time: 01/27/19  2:07 AM  Result Value Ref Range   WBC 9.1 4.0 - 10.5 K/uL   RBC 4.82 4.22 - 5.81 MIL/uL   Hemoglobin 16.0 13.0 - 17.0 g/dL   HCT 45.3 39.0 - 52.0 %   MCV 94.0 80.0 - 100.0 fL   MCH 33.2 26.0 - 34.0 pg   MCHC 35.3 30.0 - 36.0 g/dL   RDW 12.1 11.5 - 15.5 %   Platelets 227 150 - 400 K/uL   nRBC 0.0 0.0 - 0.2 %    Comment: Performed at [facility], [place] [address] [place], [place] [address]   Mr Lumbar Spine W Wo Contrast  Result Date: 01/25/2019 CLINICAL DATA:  53 year old male status post lumbar surgery 4 days ago, new severe pain radiating to the bilateral hips, left lower extremity with left side weakness today. EXAM: MRI LUMBAR  SPINE WITHOUT AND WITH CONTRAST TECHNIQUE: Multiplanar and multiecho pulse sequences of the lumbar spine were obtained without and with intravenous contrast. CONTRAST:  64mL GADAVIST GADOBUTROL 1 MMOL/ML IV SOLN COMPARISON:  Lumbar spine CT [facility] 01/25/2019. FINDINGS: Segmentation:  Normal  as on the comparison CT. Alignment:  Stable lumbar lordosis. Vertebrae: Postoperative changes to the left L3 posterior elements. No superimposed No marrow edema or evidence of acute osseous abnormality. Intact visible sacrum and SI joints. Conus medullaris and cauda equina: Conus extends to the L1-L2 level. No lower spinal cord or conus signal abnormality. Beginning at the L2 level, continuing inferiorly, and maximal at L3-L4 there is dural thickening and enhancement. This is circumferential, but left greater than right at L3-L4 as seen on series 11, image 21. The adjacent epidural fat remains normal, except at the left L3 neural foramen where there appears to be generalized soft tissue inflammation and some postoperative architectural distortion. No epidural fluid collection. The dural thickening tapers at L4 and appears resolved by L5. No superimposed thickening or enhancement of the cauda equina nerve roots is evident. Paraspinal and other soft tissues: Postoperative paraspinal fluid collection posteriorly overlying the L3 and L4 levels encompasses 34 x 34 x 59 millimeters (AP by transverse by CC) for an estimated volume of 34 milliliters. This is in the subcutaneous layer, subjacent to the incision, and tracks toward the left L3 posterior elements (series 11, image 28). The collection is mildly heterogeneous. There is superimposed bilateral subcutaneous edema. There is postoperative appearing enhancement along the left medial erector spinae muscles. Negative visible abdominal viscera. Disc levels: Negative L1-L2 and above. L2-L3: Mild spinal stenosis mostly related to epidural fat with superimposed dural thickening and enhancement. No foraminal stenosis. L3-L4: Moderate spinal stenosis related mostly to dural thickening and enhancement. Inflammation in the left L3 neural foramen which appears at least in part postoperative. Underlying circumferential disc bulge with endplate spurring and facet hypertrophy.  L4-L5: Mild disc bulge and posterior element hypertrophy. Mild dural thickening. No significant spinal stenosis. Mild left L4 foraminal stenosis. L5-S1: Negative. IMPRESSION: 1. Recent surgery at L3 with bulky circumferential dural thickening and enhancement at that level (eccentric 2 the left) which tracks cephalad to L2 and caudal to L5. This is not a typical postoperative appearance, and raises the possibility of dural infection. There is no associated epidural fluid collection. But the dural abnormality results in moderate spinal stenosis at L3-L4, and mild at L2-L3. 2. Associated inflammation in the left L3 neural foramen which may all be postoperative in nature. 3. Subcutaneous fluid collection estimated at 34 mL overlying postoperative changes to the left L3 posterior elements. Favor postop seroma. 4. No lower spinal cord signal abnormality, and the cauda equina nerve roots appear to remain normal. Electronically Signed   By: Genevie Ann M.D.   On: 01/25/2019 21:25    Pending Labs Unresulted Labs (From admission, onward)   None      Vitals/Pain Today's Vitals   01/27/19 1225 01/27/19 1226 01/27/19 1405 01/27/19 1529  BP:    137/85  Pulse:    90  Resp:    16  Temp:    98.4 F (36.9 C)  TempSrc:    Oral  SpO2:    93%  PainSc: 4  4  4       Isolation Precautions No active isolations  Medications Medications  oxyCODONE (Oxy IR/ROXICODONE) immediate release tablet 5-10 mg (10 mg Oral Given 01/27/19 1043)  irbesartan (AVAPRO) tablet 150 mg (150 mg  Oral Given 01/27/19 1037)  levothyroxine (SYNTHROID) tablet 300 mcg (300 mcg Oral Given 01/27/19 1036)  meclizine (ANTIVERT) tablet 25 mg (has no administration in time range)  ondansetron (ZOFRAN) tablet 4 mg (4 mg Oral Given 01/27/19 1037)  pantoprazole (PROTONIX) EC tablet 40 mg (40 mg Oral Given 01/27/19 1036)  methocarbamol (ROBAXIN) tablet 1,000 mg (1,000 mg Oral Given 01/27/19 1404)  diphenhydrAMINE (BENADRYL) capsule 25-50 mg (has no  administration in time range)  sodium chloride flush (NS) 0.9 % injection 3 mL (3 mLs Intravenous Not Given 01/27/19 1024)  sodium chloride flush (NS) 0.9 % injection 3 mL (has no administration in time range)  0.9 %  sodium chloride infusion (has no administration in time range)  acetaminophen (TYLENOL) tablet 650 mg (has no administration in time range)    Or  acetaminophen (TYLENOL) suppository 650 mg (has no administration in time range)  ketorolac (TORADOL) 30 MG/ML injection 30 mg (30 mg Intravenous Given 01/27/19 1043)  dexamethasone (DECADRON) injection 10 mg (10 mg Intravenous Given 01/27/19 1230)  gabapentin (NEURONTIN) tablet 300 mg (300 mg Oral Given 01/27/19 1037)  HYDROmorphone (DILAUDID) injection 1 mg (1 mg Intravenous Given 01/26/19 1709)  HYDROmorphone (DILAUDID) injection 1 mg (1 mg Intravenous Given 01/26/19 1941)  ondansetron (ZOFRAN) injection 4 mg (4 mg Intravenous Given 01/26/19 1941)    Mobility walks with device Low fall risk   Focused Assessments Here for back pain s/p surgery. Pending MRI   R Recommendations: See Admitting Provider Note  Report given to:   Additional Notes:

## 2019-01-27 NOTE — ED Notes (Signed)
Pt ambulated with walker to the bathroom. No distress noted.

## 2019-01-27 NOTE — ED Notes (Signed)
Breakfast Ordered 

## 2019-01-27 NOTE — ED Notes (Addendum)
Updated patient regarding status of pending MRI at this time. Patient made aware of inpatient nurse will attempt to call the clinic.

## 2019-01-27 NOTE — ED Notes (Signed)
Pt ambulated to bathroom with walker. Steady gait noted.

## 2019-01-27 NOTE — Progress Notes (Signed)
Subjective: Patient reports Anterior hip and quad pain better on steroids  Objective: Vital signs in last 24 hours: Temp:  [98.1 F (36.7 C)] 98.1 F (36.7 C) (09/20 1546) Pulse Rate:  [63-81] 67 (09/20 2216) Resp:  [16] 16 (09/20 2216) BP: (128-138)/(78-95) 138/86 (09/20 2216) SpO2:  [92 %-96 %] 96 % (09/20 2216)  Intake/Output from previous day: No intake/output data recorded. Intake/Output this shift: No intake/output data recorded.  Strength 5 out of 5 some difficulty with straight leg raise but severe pain on internal and external rotation of the hip joint  Lab Results: Recent Labs    01/26/19 2230 01/27/19 0207  WBC 8.8 9.1  HGB 16.5 16.0  HCT 45.3 45.3  PLT 223 227   BMET Recent Labs    01/25/19 1337 01/27/19 0207  NA 138 136  K 3.6 3.9  CL 104 99  CO2 23 26  GLUCOSE 97 133*  BUN 12 14  CREATININE 0.84 1.07  CALCIUM 8.9 8.9    Studies/Results: Andrew Love W Wo Contrast  Result Date: 01/25/2019 CLINICAL DATA:  53 year old male status post lumbar surgery 4 days ago, new severe pain radiating to the bilateral hips, left lower extremity with left side weakness today. EXAM: MRI LUMBAR Love WITHOUT AND WITH CONTRAST TECHNIQUE: Multiplanar and multiecho pulse sequences of the lumbar Love were obtained without and with intravenous contrast. CONTRAST:  9mL GADAVIST GADOBUTROL 1 MMOL/ML IV SOLN COMPARISON:  Lumbar Love CT [facility] 01/25/2019. FINDINGS: Segmentation:  Normal as on the comparison CT. Alignment:  Stable lumbar lordosis. Vertebrae: Postoperative changes to the left L3 posterior elements. No superimposed No marrow edema or evidence of acute osseous abnormality. Intact visible sacrum and SI joints. Conus medullaris and cauda equina: Conus extends to the L1-L2 level. No lower spinal cord or conus signal abnormality. Beginning at the L2 level, continuing inferiorly, and maximal at L3-L4 there is dural thickening and enhancement. This is  circumferential, but left greater than right at L3-L4 as seen on series 11, image 21. The adjacent epidural fat remains normal, except at the left L3 neural foramen where there appears to be generalized soft tissue inflammation and some postoperative architectural distortion. No epidural fluid collection. The dural thickening tapers at L4 and appears resolved by L5. No superimposed thickening or enhancement of the cauda equina nerve roots is evident. Paraspinal and other soft tissues: Postoperative paraspinal fluid collection posteriorly overlying the L3 and L4 levels encompasses 34 x 34 x 59 millimeters (AP by transverse by CC) for an estimated volume of 34 milliliters. This is in the subcutaneous layer, subjacent to the incision, and tracks toward the left L3 posterior elements (series 11, image 28). The collection is mildly heterogeneous. There is superimposed bilateral subcutaneous edema. There is postoperative appearing enhancement along the left medial erector spinae muscles. Negative visible abdominal viscera. Disc levels: Negative L1-L2 and above. L2-L3: Mild spinal stenosis mostly related to epidural fat with superimposed dural thickening and enhancement. No foraminal stenosis. L3-L4: Moderate spinal stenosis related mostly to dural thickening and enhancement. Inflammation in the left L3 neural foramen which appears at least in part postoperative. Underlying circumferential disc bulge with endplate spurring and facet hypertrophy. L4-L5: Mild disc bulge and posterior element hypertrophy. Mild dural thickening. No significant spinal stenosis. Mild left L4 foraminal stenosis. L5-S1: Negative. IMPRESSION: 1. Recent surgery at L3 with bulky circumferential dural thickening and enhancement at that level (eccentric 2 the left) which tracks cephalad to L2 and caudal to L5. This is  not a typical postoperative appearance, and raises the possibility of dural infection. There is no associated epidural fluid collection.  But the dural abnormality results in moderate spinal stenosis at L3-L4, and mild at L2-L3. 2. Associated inflammation in the left L3 neural foramen which may all be postoperative in nature. 3. Subcutaneous fluid collection estimated at 34 mL overlying postoperative changes to the left L3 posterior elements. Favor postop seroma. 4. No lower spinal cord signal abnormality, and the cauda equina nerve roots appear to remain normal. Electronically Signed   By: Genevie Ann M.D.   On: 01/25/2019 21:25    Assessment/Plan: Postop day 5 extraforaminal discectomy patient's preoperative symptoms were gone for 2days then started experiencing severe pain it seems to be more centered around the hip now he has been told in the past that he may need a hip replacement but is never been imaged.  He does have a previous history of a right hip replacement.  He did have a fall prior to presentation with his back and although he did have an L3 radiculopathy it is possible some of this could be intrinsic in the hip joint itself.  So I recommended MRI scan of his left hip continue IV Decadron as patient is improved on steroids.  LOS: 0 days     Loral Campi,Erdem P 01/27/2019, 2:40 PM

## 2019-01-28 NOTE — Discharge Summary (Signed)
Physician Discharge Summary  Patient ID: Andrew Love MRN: [id number] DOB/AGE: 1966-05-05 53 y.o. Estimated body mass index is 31.74 kg/m as calculated from the following:   Height as of 01/25/19: 6' (1.829 m).   Weight as of 01/25/19: 106.1 kg.   Admit date: 01/26/2019 Discharge date: 01/28/2019  Admission Diagnoses: Left hip pain  Discharge Diagnoses: Same Active Problems:   Hip pain   Lumbar radiculopathy   Discharged Condition: good  Hospital Course: Patient was admitted to the hospital from the ER with acute onset left leg pain 3 days postop from an extraforaminal discectomy.  Patient underwent MRI scan of his lumbar spine which showed no evidence of recurrent disc a lot of epidural fibrosis enhancement along the nerve root and thecal sac.  Patient was placed on IV steroids and noted some significant provement.  However patient's pain settled into his hip and he been told previously that he would need a hip replacement.  MRI scan of his hip showed severe osteoarthritis.  Patient be discharged on a steroid pack with scheduled follow-up with me in 1 to 2 weeks and will arrange follow-up with orthopedics.  Consults: Significant Diagnostic Studies: Treatments: Discharge Exam: Blood pressure 119/66, pulse 69, temperature 97.6 F (36.4 C), temperature source Oral, resp. rate 18, SpO2 92 %. Strength 5-5 wound clean dry and intact  Disposition: Home   Allergies as of 01/28/2019      Reactions   Other Hives, Shortness Of Breath   Cockroaches   Iohexol Hives   Shrimp [shellfish Allergy] Hives   Wheat Bran Hives   Yeast-related Products Hives      Medication List    TAKE these medications   Ambien 10 MG tablet Generic drug: zolpidem Take 10 mg by mouth at bedtime.   diphenhydrAMINE 25 MG tablet Commonly known as: BENADRYL Take 25-50 mg by mouth every 6 (six) hours as needed for allergies.   EPINEPHrine 0.3 mg/0.3 mL Soaj injection Commonly known as: EpiPen Inject  0.3 mLs (0.3 mg total) into the muscle as needed. What changed:   when to take this  reasons to take this   hydrocortisone cream 1 % Apply 1 application topically once as needed for itching.   ibuprofen 200 MG tablet Commonly known as: ADVIL Take 800 mg by mouth every 6 (six) hours as needed for moderate pain.   ibuprofen 600 MG tablet Commonly known as: ADVIL Take 1 tablet (600 mg total) by mouth every 6 (six) hours as needed.   levothyroxine 300 MCG tablet Commonly known as: SYNTHROID Take 300 mcg by mouth daily.   meclizine 25 MG tablet Commonly known as: ANTIVERT Take 1 tablet (25 mg total) by mouth 3 (three) times daily as needed for dizziness.   methocarbamol 500 MG tablet Commonly known as: ROBAXIN Take 1,000 mg by mouth 4 (four) times daily.   olmesartan 20 MG tablet Commonly known as: BENICAR Take 20 mg by mouth daily.   ondansetron 4 MG tablet Commonly known as: ZOFRAN Take 1 tablet (4 mg total) by mouth every 6 (six) hours.   Oxycodone HCl 10 MG Tabs Commonly known as: Roxicodone Take 0.5-1 tablets (5-10 mg total) by mouth every 6 (six) hours as needed for severe pain (Post operative pain).   oxyCODONE-acetaminophen 5-325 MG tablet Commonly known as: Percocet Take 1-2 tablets by mouth every 6 (six) hours as needed for severe pain.   pantoprazole 40 MG tablet Commonly known as: PROTONIX TAKE 1 TABLET BY MOUTH ONCE DAILY  Signed: Janissa Bertram,Gareth P 01/28/2019, 7:35 AM

## 2019-01-28 NOTE — Plan of Care (Signed)
Patient discharged home this morning. Discharged instructions given to patient with stated understanding of instructions given. Patient has an appointment with Dr. Saintclair Halsted in 1 week

## 2019-01-30 LAB — CULTURE, BLOOD (ROUTINE X 2)
Culture: NO GROWTH
Culture: NO GROWTH
Special Requests: ADEQUATE
Special Requests: ADEQUATE

## 2019-02-06 DIAGNOSIS — J189 Pneumonia, unspecified organism: Secondary | ICD-10-CM

## 2019-02-06 HISTORY — DX: Pneumonia, unspecified organism: J18.9

## 2019-02-24 ENCOUNTER — Other Ambulatory Visit: Payer: Self-pay

## 2019-02-24 ENCOUNTER — Encounter (HOSPITAL_COMMUNITY): Payer: Self-pay

## 2019-02-24 ENCOUNTER — Ambulatory Visit (HOSPITAL_COMMUNITY): Payer: BC Managed Care – PPO | Attending: Neurosurgery

## 2019-02-24 DIAGNOSIS — R29898 Other symptoms and signs involving the musculoskeletal system: Secondary | ICD-10-CM | POA: Insufficient documentation

## 2019-02-24 DIAGNOSIS — M545 Low back pain, unspecified: Secondary | ICD-10-CM

## 2019-02-24 DIAGNOSIS — M6281 Muscle weakness (generalized): Secondary | ICD-10-CM | POA: Insufficient documentation

## 2019-02-24 DIAGNOSIS — G8929 Other chronic pain: Secondary | ICD-10-CM | POA: Diagnosis present

## 2019-02-24 NOTE — Therapy (Signed)
[place], [place], [address] Phone: [phone]   Fax:  [phone]  Physical Therapy Evaluation  Patient Details  Name: Andrew Love MRN: [id number] Date of Birth: 04-23-1968 Referring Provider (PT): Kary Kos, MD   Encounter Date: 02/24/2019  PT End of Session - 02/24/19 1428    Visit Number  1    Number of Visits  16    Date for PT Re-Evaluation  04/21/19    Authorization Type  [employer]    Authorization Time Period  02/24/19 to 04/25/19    Authorization - Visit Number  1    Authorization - Number of Visits  30   30 visit limit PT/OT/chiro combined   PT Start Time  0816    PT Stop Time  0902    PT Time Calculation (min)  46 min    Activity Tolerance  Patient tolerated treatment well    Behavior During Therapy  [facility] for tasks assessed/performed       Past Medical History:  Diagnosis Date  . Arthritis   . Back pain    chronic  . Bronchitis    history of  . CAD (coronary artery disease)   . Chronic back pain   . Chronic insomnia    Per medical history form dated 06/13/10.  . Chronic left hip pain   . Colitis    Per medical history from dated 06/13/10.  . Diverticulitis    Hx of; requiring 3 admissions  . Elevated liver enzymes   . Hypertension   . Hypothyroidism   . Left leg pain    chronic  . 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 Psoriasis    Per medical history form dated 06/13/10.  . Sigmoid colon ulcer    Rectal polyps  . Sleep apnea with use of continuous positive airway pressure (CPAP)     Past Surgical History:  Procedure Laterality Date  . APPENDECTOMY     8th grade  . BACK SURGERY    . CARDIAC CATHETERIZATION  2009  . CARDIAC CATHETERIZATION     Per medical history from dated 06/13/10.  . CERVICAL SPINE SURGERY     C4, C5, C6 spinal fusion  . CHOLECYSTECTOMY N/A 08/31/2015   Procedure: LAPAROSCOPIC CHOLECYSTECTOMY WITH INTRAOPERATIVE CHOLANGIOGRAM;   Surgeon: Excell Seltzer, MD;  Location: [facility];  Service: General;  Laterality: N/A;  . COLONOSCOPY  07/2008   Colitis,NSAID v. Ischemia,malrotation of the gut,Diverticulosis(L),hyperplastic  . COLONOSCOPY N/A 12/13/2012   Dr. Oneida Alar: Normal TI, mild sigmoid diverticulosis, hemorrhoids, 2 polyps (tubular adenoma). Random colon bx negative. Next TCS 12/2022 with Fentanyl/phenergan  . ESOPHAGOGASTRODUODENOSCOPY N/A 04/16/2014   RMR: Erosive reflux esophagitis. Non critical Schzki's ring not manipulated. Small hiatal hernia. Abnormal gastirc mucosa of doubtful signigicance status post biopsy. I suspect trivial upper GI bleed. Recent abdominal pain presumably secondary to a protracted bout of diverticulitis. CT scan November 23 revealed improvemetn without complication. He finished his antibiotics 2 days age. Left sided ab  . JOINT REPLACEMENT    . LAPAROSCOPIC SIGMOID COLECTOMY  2012   Dr. Fanny Skates: recurrent sigmoid diverticulitis  . LAPAROSCOPIC SIGMOID COLECTOMY  2012   recurernt sigmoid diverticulitis, Dr. Dalbert Batman   . ROTATOR CUFF REPAIR     Left - per medical history form dated 06/13/10.  Marland Kitchen SHOULDER SURGERY     Left  . THYROIDECTOMY     Per medical history form dated 06/13/10.  Marland Kitchen TOTAL HIP ARTHROPLASTY  07/04/2011   Procedure: TOTAL HIP ARTHROPLASTY;  Surgeon: Johnny Bridge, MD;  Location: [place];  Service: Orthopedics;  Laterality: Right;    There were no vitals filed for this visit.   Subjective Assessment - 02/24/19 0823    Subjective  Pt reports L3-5 disc ruptured and removed, scar tissue L2-5 removed surgery on 01/22/19 went well, increased pain and swelling by Friday or Saturday after. Readmitted to hospital on Saturday with increased swelling in back on L side, unable to ambulate, so they treated him for pain and with steroid medication. Pt reports pain has maintained high, but is going on 3 good days in a row currently. Pt reports returned to surgeon 02/18/19 and was still swollen,  so was given oral steroids and bed rest restrictions with ice packs on/off 20 minute cycle. Pt reports currently off bed rest restrictions, able to drive, only no lifting, bending, twisting restrictions still apply. 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 Pt reports typical pain 6-7/10, currently only 4/10. Pt reports hasn't tried full activity at home due to fear of flaring up the pain and swelling. Pt reports bending to retrieve light objects from floor with increasing pain over the past week. Pt denies changes to bowel/bladder, denies any shooting pains down LLE this week (normally L anterior leg and thigh), occasional foot drop noted. Pt reports fall when returning to bed from restroom August 2020 which started all of this back pain and ended in surgical intervention. Pt reports had back surgery due to ruptured discs back in 2014 or 2015. Pt reports he was told at the hospital that his L hip needs replaced. Pt denies radiating pain or paresthesias down L leg today, reports when it does happen it is along anterior thigh/groin down to knee.    Pertinent History  C4-6 fusion, R THA 2013    Limitations  Sitting;Lifting;Standing;Walking    How long can you sit comfortably?  15-20 minutes, less if soft surface    How long can you stand comfortably?  10-15 minutes    How long can you walk comfortably?  30 minutes    Diagnostic tests  MRI 9/19 low back- L3 bulky circumferential dural thickening and enhancement L2-L5 (dural infection?) no associated epidural fluid collection; dural abnormality results in moderate spinal stenosis at L3-L4 and mild at L2-L3; Associated inflammation in the left L3 neural foramen; post op seroma of subcutaneous fluid collection 34 mL overlying postop changes to the left L3 posterior elements; hip OA    Patient Stated Goals  to strengthen core    Currently in Pain?  Yes    Pain Score  4     Pain Location   Back    Pain Orientation  Left;Lower;Medial    Pain Descriptors / Indicators  Sharp;Aching   stiff, like it's going to crack   Pain Type  Surgical pain    Pain Radiating Towards  --    Pain Onset  More than a month ago    Pain Frequency  Constant    Aggravating Factors   bending, prolonged standing, prolonged sitting    Pain Relieving Factors  lying flat, ice, pain medication    Effect of Pain on Daily Activities  limited         [facility] PT Assessment - 02/24/19 0001      Assessment   Medical Diagnosis  Herniated lumbar disc w/o myelopathy    Referring Provider (PT)  Kary Kos, MD    Onset Date/Surgical Date  01/22/19    Next MD Visit  November 2020    Prior Therapy  None      Precautions   Precautions  Back    Precaution Comments  Pt reports no bending, lifting, twisting      Restrictions   Weight Bearing Restrictions  No      Balance Screen   Has the patient fallen in the past 6 months  Yes    How many times?  1, August 2020 going to restroom in night    Has the patient had a decrease in activity level because of a fear of falling?   No    Is the patient reluctant to leave their home because of a fear of falling?   No      Prior Function   Level of Independence  Independent    Vocation  Full time employment    Vocation Requirements  Prolonged sitting, watching camera    Leisure  Golfing      Cognition   Overall Cognitive Status  Within Functional Limits for tasks assessed      Observation/Other Assessments   Skin Integrity  incision in tact, no drainage, no redness, no warmth, slight swelling to L side of spine    Focus on Therapeutic Outcomes (FOTO)   61% limited      Coordination   Heel Shin Test  no deficits bilaterally      Functional Tests   Functional tests  Sit to Stand      Sit to Stand   Comments  5x STS: 28.7 sec, from chair, no UE assist      ROM / Strength   AROM / PROM / Strength  Strength      Strength   Strength Assessment Site   Hip;Knee;Ankle    Right Hip Flexion  3+/5   painful L low back   Right Hip Extension  3+/5   painful L low back   Right Hip ABduction  5/5    Left Hip Flexion  3+/5   painful L low back   Left Hip Extension  3+/5    Left Hip ABduction  3+/5    Right Knee Flexion  4+/5    Right Knee Extension  5/5    Left Knee Flexion  4+/5    Left Knee Extension  5/5    Right Ankle Dorsiflexion  5/5    Left Ankle Dorsiflexion  4+/5      Flexibility   Soft Tissue Assessment /Muscle Length  yes    Hamstrings  90/90: no deficits bilaterally    Quadriceps  Ely's: positive for tightness bil, negative for pain bilaterally    ITB  no deficits bilaterally    Piriformis  L moderate tightness      Palpation   Spinal mobility  no CPAs to lumbar vertebrae    Palpation comment  pt denies increase in pain with palpation to lumbar paraspinals      Special Tests    Special Tests  Lumbar    Lumbar Tests  Slump Test;Straight Leg Raise      Slump test   Findings  Positive    Side  Left      Straight Leg Raise   Findings  Positive    Side   Left    Comment  L at 45 deg      Ambulation/Gait   Gait Comments  Observed pt ambulate into/out  of clinic, no gait abnormalities noted, no foot drop noted, no unsteadiness noted      Balance   Balance Assessed  Yes      Static Standing Balance   Static Standing - Balance Support  No upper extremity supported    Static Standing Balance -  Activities   Single Leg Stance - Right Leg;Single Leg Stance - Left Leg    Static Standing - Comment/# of Minutes  30+ sec bilaterally, increased pain with L SLS         Objective measurements completed on examination: See above findings.      PT Education - 02/24/19 1427    Education Details  Assessment findings, FOTO findings, POC, exercise technique and initiated HEP    Person(s) Educated  Patient    Methods  Explanation;Demonstration;Handout    Comprehension  Verbalized understanding;Returned demonstration        PT Short Term Goals - 02/24/19 1538      PT SHORT TERM GOAL #1   Title  Pt will perform HEP correctly and consistently at least 3x/week, progressing as needed to improve strength and reduce pain with functional activites.    Time  4    Period  Weeks    Status  New    Target Date  03/24/19      PT SHORT TERM GOAL #2   Title  Pt will perform 5x STS in 20 sec, without UE assist to demo improved functional strength with transfers.    Time  4    Period  Weeks    Status  New        PT Long Term Goals - 02/24/19 1540      PT LONG TERM GOAL #1   Title  Pt will demo 4/5 BLE and core strength to reduce back pain and improve ability to perform light chores around the home.    Time  8    Period  Weeks    Status  New    Target Date  04/21/19      PT LONG TERM GOAL #2   Title  Pt will have negaitve SLR test and slump test on LLE and deny radiating symptoms down LLE to improve QoL.    Time  8    Period  Weeks    Status  New      PT LONG TERM GOAL #3   Title  Pt will report ability to stand for 30 minutes with 4/10 pain at worst to complete self care tasks and prepare meal.    Time  8    Period  Weeks    Status  New      PT LONG TERM GOAL #4   Title  Pt will be able to ambulate for 60 minutes with 4/10 pain at worst to return to light exercise program.    Time  8    Period  Weeks    Status  New             Plan - 02/24/19 1524    Clinical Impression Statement  Pt is a pleasant 53 YO male s/p L extraforaminal microdiscectomy at L3-4 on 01/22/19. Functionally, pt performs transfers at decreased speed, demonstrating increased pain. Pt demonstrates equal SLS balance, but with increased pain in L SLS. Pt with positive L SLR test and L slump test increasing low back pain. Pt with bil quad tightness in prone and L piriformis tightness noted. Objectively, pt with decreased strength bilaterally, also with  increased back pain with resistance. Pt's incision is intact, no drainage, no  redness or increased temperature, slight swelling noted. Pt would benefit from skilled PT interventions to improve deficits noted, reduce pain and improve ability to perform functional activities.    Personal Factors and Comorbidities  Comorbidity 2;Past/Current Experience    Comorbidities  HTN, chronic LBP    Examination-Activity Limitations  Bend;Carry;Lift;Squat;Stand;Transfers    Examination-Participation Restrictions  Community Activity;Driving;Yard Work    Stability/Clinical Decision Making  Stable/Uncomplicated    Designer, jewellery  Low    Rehab Potential  Good    PT Frequency  2x / week    PT Duration  8 weeks    PT Treatment/Interventions  ADLs/Self Care Home Management;Aquatic Therapy;Biofeedback;DME Instruction;Gait training;Stair training;Functional mobility training;Therapeutic activities;Therapeutic exercise;Balance training;Neuromuscular re-education;Patient/family education;Orthotic Fit/Training;Manual techniques;Scar mobilization;Passive range of motion;Dry needling;Taping;Joint Manipulations    PT Next Visit Plan  Review goals, HEP. Begin core strengthening in hooklying. Initiate hip strengthening. Add balance exercises as able. Monitor pain, swelling along L paraspinals and signs of infection.    PT Home Exercise Plan  Eval: TA with exhale    Consulted and Agree with Plan of Care  Patient       Patient will benefit from skilled therapeutic intervention in order to improve the following deficits and impairments:  Decreased activity tolerance, Decreased strength, Difficulty walking, Impaired perceived functional ability, Impaired flexibility, Impaired sensation, Pain  Visit Diagnosis: Chronic left-sided low back pain without sciatica  Muscle weakness (generalized)  Other symptoms and signs involving the musculoskeletal system     Problem List Patient Active Problem List   Diagnosis Date Noted  . Lumbar radiculopathy 01/26/2019  . LUQ pain 06/19/2016  .  Symptomatic cholelithiasis 08/30/2015  . Preoperative cardiovascular examination   . Coronary artery disease involving native coronary artery of native heart without angina pectoris   . Gastroenteritis 08/26/2015  . Cholelithiases 08/24/2015  . Abdominal pain 08/24/2015  . Cholecystitis, acute 08/24/2015  . Diarrhea 08/24/2015  . Acute cholecystitis 08/24/2015  . Nausea without vomiting 04/08/2015  . Diverticulitis of colon   . Cerebral thrombosis with cerebral infarction ([place]) 03/21/2014  . Stroke ([place]) 03/21/2014  . Essential hypertension 03/21/2014  . TIA (transient ischemic attack)   . HLD (hyperlipidemia)   . Thyroid activity decreased   . Colitis 11/20/2012  . CAD (coronary artery disease)   . Hip pain 08/04/2011  . 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 LIVER FUNCTION TESTS, ABNORMAL, HX OF 12/17/2008     Talbot Grumbling PT, DPT 02/24/19, 3:47 PM [facility] [address] [place], [place], [address] Phone: [phone]   Fax:  [phone]  Name: VIRLAN MAHNKEN MRN: [id number] Date of Birth: 09/19/1965

## 2019-02-26 ENCOUNTER — Encounter (HOSPITAL_COMMUNITY): Payer: BC Managed Care – PPO

## 2019-02-26 ENCOUNTER — Telehealth (HOSPITAL_COMMUNITY): Payer: Self-pay

## 2019-02-26 ENCOUNTER — Ambulatory Visit
Admission: EM | Admit: 2019-02-26 | Discharge: 2019-02-26 | Disposition: A | Discharge status: Home / Self Care | Payer: BC Managed Care – PPO | Attending: Emergency Medicine | Admitting: Emergency Medicine

## 2019-02-26 ENCOUNTER — Other Ambulatory Visit: Payer: Self-pay

## 2019-02-26 DIAGNOSIS — Z20828 Contact with and (suspected) exposure to other viral communicable diseases: Secondary | ICD-10-CM

## 2019-02-26 DIAGNOSIS — J069 Acute upper respiratory infection, unspecified: Secondary | ICD-10-CM

## 2019-02-26 DIAGNOSIS — Z20822 Contact with and (suspected) exposure to covid-19: Secondary | ICD-10-CM

## 2019-02-26 MED ORDER — BENZONATATE 100 MG PO CAPS: 100.0000 mg | ORAL_CAPSULE | Freq: Three times a day (TID) | ORAL | 0 refills | Status: DC

## 2019-02-26 MED ORDER — CETIRIZINE HCL 10 MG PO CHEW: 10.0000 mg | CHEWABLE_TABLET | Freq: Every day | ORAL | 0 refills | Status: DC

## 2019-02-26 MED ORDER — FLUTICASONE PROPIONATE 50 MCG/ACT NA SUSP: 2.0000 | Freq: Every day | NASAL | 0 refills | Status: DC

## 2019-02-26 NOTE — ED Provider Notes (Signed)
[facility]   [id number] 02/26/19 Arrival Time: [id number]   CC: COVID exposure, test; URI symptoms   SUBJECTIVE: History from: patient.  Andrew Love is a 53 y.o. male who presents with runny nose, congestion x 1-2 weeks and LT sided HA x 1-2 days .  Admits to COVID exposure.  Wife tested positive for COVID.  Has tried OTC zyrtec with relief.  Denies aggravating factors.  Denies previous symptoms in the past.   Denies fever, chills, fatigue, sore throat, SOB, wheezing, chest pain, nausea, vomiting, changes in bowel or bladder habits.    ROS: As per HPI.  All other pertinent ROS negative.     Past Medical History:  Diagnosis Date  . Arthritis   . Back pain    chronic  . Bronchitis    history of  . CAD (coronary artery disease)   . Chronic back pain   . Chronic insomnia    Per medical history form dated 06/13/10.  . Chronic left hip pain   . Colitis    Per medical history from dated 06/13/10.  . Diverticulitis    Hx of; requiring 3 admissions  . Elevated liver enzymes   . Hypertension   . Hypothyroidism   . Left leg pain    chronic  . 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 Psoriasis    Per medical history form dated 06/13/10.  . Sigmoid colon ulcer    Rectal polyps  . Sleep apnea with use of continuous positive airway pressure (CPAP)    Past Surgical History:  Procedure Laterality Date  . APPENDECTOMY     8th grade  . BACK SURGERY    . CARDIAC CATHETERIZATION  2009  . CARDIAC CATHETERIZATION     Per medical history from dated 06/13/10.  . CERVICAL SPINE SURGERY     C4, C5, C6 spinal fusion  . CHOLECYSTECTOMY N/A 08/31/2015   Procedure: LAPAROSCOPIC CHOLECYSTECTOMY WITH INTRAOPERATIVE CHOLANGIOGRAM;  Surgeon: Excell Seltzer, MD;  Location: [facility];  Service: General;  Laterality: N/A;  . COLONOSCOPY  07/2008   Colitis,NSAID v. Ischemia,malrotation of the gut,Diverticulosis(L),hyperplastic  . COLONOSCOPY N/A 12/13/2012   Dr. Oneida Alar:  Normal TI, mild sigmoid diverticulosis, hemorrhoids, 2 polyps (tubular adenoma). Random colon bx negative. Next TCS 12/2022 with Fentanyl/phenergan  . ESOPHAGOGASTRODUODENOSCOPY N/A 04/16/2014   RMR: Erosive reflux esophagitis. Non critical Schzki's ring not manipulated. Small hiatal hernia. Abnormal gastirc mucosa of doubtful signigicance status post biopsy. I suspect trivial upper GI bleed. Recent abdominal pain presumably secondary to a protracted bout of diverticulitis. CT scan November 23 revealed improvemetn without complication. He finished his antibiotics 2 days age. Left sided ab  . JOINT REPLACEMENT    . LAPAROSCOPIC SIGMOID COLECTOMY  2012   Dr. Fanny Skates: recurrent sigmoid diverticulitis  . LAPAROSCOPIC SIGMOID COLECTOMY  2012   recurernt sigmoid diverticulitis, Dr. Dalbert Batman   . ROTATOR CUFF REPAIR     Left - per medical history form dated 06/13/10.  Marland Kitchen SHOULDER SURGERY     Left  . THYROIDECTOMY     Per medical history form dated 06/13/10.  Marland Kitchen TOTAL HIP ARTHROPLASTY  07/04/2011   Procedure: TOTAL HIP ARTHROPLASTY;  Surgeon: Johnny Bridge, MD;  Location: [facility];  Service: Orthopedics;  Laterality: Right;   Allergies  Allergen Reactions  . Other Hives and Shortness Of Breath    Cockroaches  . Iohexol Hives  . Shrimp [Shellfish Allergy] Hives  . Wheat Bran Hives  . Yeast-Related Products Hives  No current facility-administered medications on file prior to encounter.    Current Outpatient Medications on File Prior to Encounter  Medication Sig Dispense Refill  . diphenhydrAMINE (BENADRYL) 25 MG tablet Take 25-50 mg by mouth every 6 (six) hours as needed for allergies.     Marland Kitchen EPINEPHrine (EPIPEN) 0.3 mg/0.3 mL SOAJ Inject 0.3 mLs (0.3 mg total) into the muscle as needed. (Patient taking differently: Inject 0.3 mg into the muscle daily as needed (allergic reaction). ) 1 Device 3  . hydrocortisone cream 1 % Apply 1 application topically once as needed for itching.    Marland Kitchen ibuprofen  (ADVIL) 200 MG tablet Take 800 mg by mouth every 6 (six) hours as needed for moderate pain.    Marland Kitchen ibuprofen (ADVIL,MOTRIN) 600 MG tablet Take 1 tablet (600 mg total) by mouth every 6 (six) hours as needed. (Patient not taking: Reported on 01/26/2019) 30 tablet 0  . levothyroxine (SYNTHROID, LEVOTHROID) 300 MCG tablet Take 300 mcg by mouth daily.    . meclizine (ANTIVERT) 25 MG tablet Take 1 tablet (25 mg total) by mouth 3 (three) times daily as needed for dizziness. 30 tablet 0  . methocarbamol (ROBAXIN) 500 MG tablet Take 1,000 mg by mouth 4 (four) times daily.    Marland Kitchen olmesartan (BENICAR) 20 MG tablet Take 20 mg by mouth daily.     . ondansetron (ZOFRAN) 4 MG tablet Take 1 tablet (4 mg total) by mouth every 6 (six) hours. 12 tablet 0  . Oxycodone HCl 10 MG TABS Take 0.5-1 tablets (5-10 mg total) by mouth every 6 (six) hours as needed for severe pain (Post operative pain). 12 tablet 0  . oxyCODONE-acetaminophen (PERCOCET) 5-325 MG tablet Take 1-2 tablets by mouth every 6 (six) hours as needed for severe pain. 20 tablet 0  . pantoprazole (PROTONIX) 40 MG tablet TAKE 1 TABLET BY MOUTH ONCE DAILY (Patient taking differently: Take 40 mg by mouth daily. ) 30 tablet 11  . zolpidem (AMBIEN) 10 MG tablet Take 10 mg by mouth at bedtime.     . [DISCONTINUED] promethazine (PHENERGAN) 25 MG tablet Take 1 tablet (25 mg total) by mouth every 6 (six) hours as needed for nausea or vomiting. 30 tablet 0   Social History   Socioeconomic History  . Marital status: Married    Spouse name: Not on file  . Number of children: Not on file  . Years of education: Not on file  . Highest education level: Not on file  Occupational History  . 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 Financial resource strain: Not on file  . Food insecurity    Worry: Not on file    Inability: Not on file  . Transportation needs    Medical: Not on file    Non-medical: Not on file  Tobacco Use  . Smoking status: Current Every Day  Smoker    Packs/day: 0.50    Years: 18.00    Pack years: 9.00    Types: Cigarettes  . Smokeless tobacco: Never Used  . Tobacco comment:    Substance and Sexual Activity  . Alcohol use: No    Alcohol/week: 0.0 standard drinks  . Drug use: No  . Sexual activity: Not on file  Lifestyle  . Physical activity    Days per week: Not on file    Minutes per session: Not on file  . Stress: Not on file  Relationships  . Social connections    Talks on phone: Not on file  Gets together: Not on file    Attends religious service: Not on file    Active member of club or organization: Not on file    Attends meetings of clubs or organizations: Not on file    Relationship status: Not on file  . Intimate partner violence    Fear of current or ex partner: Not on file    Emotionally abused: Not on file    Physically abused: Not on file    Forced sexual activity: Not on file  Other Topics Concern  . 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   Married   Family History  Problem Relation Age of Onset  . Cancer Mother        breast cancer - per medical history form dated 06/13/10.  . Diverticulitis Mother   . Hypertension Mother   . Cancer Father        skin - per medical history form dated 06/13/10.  Marland Kitchen Hypertension Father   . Anesthesia problems Neg Hx   . Hypotension Neg Hx   . Malignant hyperthermia Neg Hx   . Pseudochol deficiency Neg Hx   . Colon cancer Neg Hx     OBJECTIVE:  Vitals:   02/26/19 1138  BP: (!) 137/93  Pulse: 84  Resp: 18  Temp: 98.9 F (37.2 C)  SpO2: 94%     General appearance: alert; well-appearing, nontoxic; speaking in full sentences and tolerating own secretions HEENT: NCAT; Ears: EACs clear, TMs pearly gray; Eyes: PERRL.  EOM grossly intact. Sinuses: nontender; Nose: nares patent without rhinorrhea, Throat: oropharynx clear, tonsils non erythematous or enlarged, uvula midline  Neck: supple without LAD Lungs:  unlabored respirations, symmetrical air entry; cough: absent; no respiratory distress; CTAB Heart: regular rate and rhythm.  Radial pulses 2+ symmetrical bilaterally Skin: warm and dry Psychological: alert and cooperative; normal mood and affect  ASSESSMENT & PLAN:  1. Close exposure to COVID-19 virus   2. Suspected COVID-19 virus infection   3. Viral URI     Meds ordered this encounter  Medications  . cetirizine (ZYRTEC) 10 MG chewable tablet    Sig: Chew 1 tablet (10 mg total) by mouth daily.    Dispense:  20 tablet    Refill:  0    Order Specific Question:   Supervising Provider    Answer:   Raylene Everts [id number]  . fluticasone (FLONASE) 50 MCG/ACT nasal spray    Sig: Place 2 sprays into both nostrils daily.    Dispense:  16 g    Refill:  0    Order Specific Question:   Supervising Provider    Answer:   Raylene Everts [id number]  . benzonatate (TESSALON) 100 MG capsule    Sig: Take 1 capsule (100 mg total) by mouth every 8 (eight) hours.    Dispense:  21 capsule    Refill:  0    Order Specific Question:   Supervising Provider    Answer:   Raylene Everts [id number]    COVID testing ordered.  It will take between 5-7 days for test results.  Someone will contact you regarding abnormal results.    In the meantime: You should remain isolated in your home for 10 days from symptom onset AND greater than 72 hours after symptoms resolution (absence of fever without the use of fever-reducing medication and improvement in respiratory symptoms), whichever is longer Get plenty of rest and push fluids Tessalon sent  in also as needed if you develop cough Zyrtec prescribed for nasal congestion, runny nose, and/or sore throat Flonase prescribed for nasal congestion and runny nose Use medications daily for symptom relief Use OTC medications like ibuprofen or tylenol as needed fever or pain Call or go to the ED if you have any new or worsening symptoms such as fever,  worsening cough, shortness of breath, chest tightness, chest pain, turning blue, changes in mental status, etc...   Reviewed expectations re: course of current medical issues. Questions answered. Outlined signs and symptoms indicating need for more acute intervention. Patient verbalized understanding. After Visit Summary given.         Lestine Box, PA-C 02/26/19 1201

## 2019-02-26 NOTE — Discharge Instructions (Signed)
COVID testing ordered.  It will take between 5-7 days for test results.  Someone will contact you regarding abnormal results.    In the meantime: You should remain isolated in your home for 10 days from symptom onset AND greater than 72 hours after symptoms resolution (absence of fever without the use of fever-reducing medication and improvement in respiratory symptoms), whichever is longer Get plenty of rest and push fluids Zyrtec prescribed for nasal congestion, runny nose, and/or sore throat Flonase prescribed for nasal congestion and runny nose Use medications daily for symptom relief Use OTC medications like ibuprofen or tylenol as needed fever or pain Call or go to the ED if you have any new or worsening symptoms such as fever, worsening cough, shortness of breath, chest tightness, chest pain, turning blue, changes in mental status, etc..Marland Kitchen

## 2019-02-26 NOTE — Telephone Encounter (Signed)
pt called in to say that a family member that he has been around tested positve and the pt wants to be tested before coming back to therapy

## 2019-02-26 NOTE — ED Triage Notes (Signed)
Pts wife positive for covid and pt now developing symptoms

## 2019-02-28 ENCOUNTER — Telehealth (HOSPITAL_COMMUNITY): Payer: Self-pay | Admitting: Emergency Medicine

## 2019-02-28 LAB — NOVEL CORONAVIRUS, NAA: SARS-CoV-2, NAA: NOT DETECTED

## 2019-02-28 NOTE — Telephone Encounter (Signed)
Pt called requesting covid test results. Results reported as negative.  

## 2019-03-03 ENCOUNTER — Other Ambulatory Visit: Payer: Self-pay

## 2019-03-03 ENCOUNTER — Emergency Department (HOSPITAL_COMMUNITY)
Admission: EM | Admit: 2019-03-03 | Discharge: 2019-03-03 | Disposition: A | Discharge status: Home / Self Care | Payer: BC Managed Care – PPO | Attending: Emergency Medicine | Admitting: Emergency Medicine

## 2019-03-03 ENCOUNTER — Encounter (HOSPITAL_COMMUNITY): Payer: Self-pay

## 2019-03-03 ENCOUNTER — Emergency Department (HOSPITAL_COMMUNITY): Payer: BC Managed Care – PPO

## 2019-03-03 DIAGNOSIS — I10 Essential (primary) hypertension: Secondary | ICD-10-CM | POA: Diagnosis not present

## 2019-03-03 DIAGNOSIS — Z8673 Personal history of transient ischemic attack (TIA), and cerebral infarction without residual deficits: Secondary | ICD-10-CM | POA: Insufficient documentation

## 2019-03-03 DIAGNOSIS — R0602 Shortness of breath: Secondary | ICD-10-CM

## 2019-03-03 DIAGNOSIS — J189 Pneumonia, unspecified organism: Secondary | ICD-10-CM | POA: Diagnosis not present

## 2019-03-03 DIAGNOSIS — Z79899 Other long term (current) drug therapy: Secondary | ICD-10-CM | POA: Insufficient documentation

## 2019-03-03 DIAGNOSIS — Z20822 Contact with and (suspected) exposure to covid-19: Secondary | ICD-10-CM

## 2019-03-03 DIAGNOSIS — E039 Hypothyroidism, unspecified: Secondary | ICD-10-CM | POA: Insufficient documentation

## 2019-03-03 DIAGNOSIS — U071 COVID-19: Secondary | ICD-10-CM | POA: Diagnosis not present

## 2019-03-03 DIAGNOSIS — I2510 Atherosclerotic heart disease of native coronary artery without angina pectoris: Secondary | ICD-10-CM | POA: Insufficient documentation

## 2019-03-03 DIAGNOSIS — F17210 Nicotine dependence, cigarettes, uncomplicated: Secondary | ICD-10-CM | POA: Diagnosis not present

## 2019-03-03 DIAGNOSIS — Z20828 Contact with and (suspected) exposure to other viral communicable diseases: Secondary | ICD-10-CM

## 2019-03-03 LAB — BASIC METABOLIC PANEL
Anion gap: 9 (ref 5–15)
BUN: 7 mg/dL (ref 6–20)
CO2: 24 mmol/L (ref 22–32)
Calcium: 9 mg/dL (ref 8.9–10.3)
Chloride: 105 mmol/L (ref 98–111)
Creatinine, Ser: 0.96 mg/dL (ref 0.61–1.24)
GFR calc Af Amer: >60 mL/min (ref >60)
GFR calc non Af Amer: >60 mL/min (ref >60)
Glucose, Bld: 75 mg/dL (ref 70–99)
Potassium: 3.6 mmol/L (ref 3.5–5.1)
Sodium: 138 mmol/L (ref 135–145)

## 2019-03-03 LAB — CBC WITH DIFFERENTIAL/PLATELET
Abs Immature Granulocytes: 0.06 10*3/uL (ref 0.00–0.07)
Basophils Absolute: 0 10*3/uL (ref 0.0–0.1)
Basophils Relative: 1 %
Eosinophils Absolute: 0.1 10*3/uL (ref 0.0–0.5)
Eosinophils Relative: 2 %
HCT: 47.8 % (ref 39.0–52.0)
Hemoglobin: 16.3 g/dL (ref 13.0–17.0)
Immature Granulocytes: 1 %
Lymphocytes Relative: 19 %
Lymphs Abs: 1.1 10*3/uL (ref 0.7–4.0)
MCH: 32.5 pg (ref 26.0–34.0)
MCHC: 34.1 g/dL (ref 30.0–36.0)
MCV: 95.2 fL (ref 80.0–100.0)
Monocytes Absolute: 0.7 10*3/uL (ref 0.1–1.0)
Monocytes Relative: 12 %
Neutro Abs: 3.7 10*3/uL (ref 1.7–7.7)
Neutrophils Relative %: 65 %
Platelets: 179 10*3/uL (ref 150–400)
RBC: 5.02 MIL/uL (ref 4.22–5.81)
RDW: 12.7 % (ref 11.5–15.5)
WBC: 5.7 10*3/uL (ref 4.0–10.5)
nRBC: 0 % (ref 0.0–0.2)

## 2019-03-03 LAB — URINALYSIS, ROUTINE W REFLEX MICROSCOPIC
Bilirubin Urine: NEGATIVE
Glucose, UA: NEGATIVE mg/dL
Hgb urine dipstick: NEGATIVE
Ketones, ur: NEGATIVE mg/dL
Leukocytes,Ua: NEGATIVE
Nitrite: NEGATIVE
Protein, ur: NEGATIVE mg/dL
Specific Gravity, Urine: 1.009 (ref 1.005–1.030)
pH: 7 (ref 5.0–8.0)

## 2019-03-03 MED ORDER — CEFTRIAXONE SODIUM 1 G IJ SOLR
1.0000 g | Freq: Once | INTRAMUSCULAR | Status: AC
  Administered 2019-03-03: 1 g via INTRAMUSCULAR
  Filled 2019-03-03: qty 10

## 2019-03-03 MED ORDER — AZITHROMYCIN 250 MG PO TABS
500.0000 mg | ORAL_TABLET | Freq: Once | ORAL | Status: AC
  Administered 2019-03-03: 500 mg via ORAL
  Filled 2019-03-03: qty 2

## 2019-03-03 MED ORDER — SODIUM CHLORIDE (PF) 0.9 % IJ SOLN
INTRAMUSCULAR | Status: AC
  Administered 2019-03-03: 20:00:00
  Filled 2019-03-03: qty 10

## 2019-03-03 MED ORDER — ALBUTEROL SULFATE HFA 108 (90 BASE) MCG/ACT IN AERS
2.0000 | INHALATION_SPRAY | Freq: Once | RESPIRATORY_TRACT | Status: AC
  Administered 2019-03-03: 2 via RESPIRATORY_TRACT
  Filled 2019-03-03: qty 6.7

## 2019-03-03 NOTE — ED Notes (Signed)
ambulated pt in room / pt remained 96% on room air. Denies sob. Just chronic back pain

## 2019-03-03 NOTE — ED Triage Notes (Signed)
Pt reports wife tested positive and is in [place] . Pt was tested and negative on Friday. Pt reports increase SOB with exertion and says his sats dropped into the 80's

## 2019-03-03 NOTE — Discharge Instructions (Signed)
Take the entire course of the antibiotic prescribed, your next dose of zithromax should be taken tomorrow evening. Rest and make sure you are drinking plenty of fluids.  Continue treating your fever with ibuprofen or tylenol.  You may use the albuterol inhaler, taking 2 puffs every 4 hours if needed for wheezing or shortness of breath. Return here for any worsening symptoms as discussed.  Your covid test should result within the next 24 hours.  You will need to stay under home isolation until this test is resulted (and is negative). If positive, you will need to isolate for 10 days from the onset of your symptoms, or for 72 hours after you are no longer running a fever, whichever is longer.       Person Under Monitoring Name: Andrew Love  Location: [id number] [place] [address]   Infection Prevention Recommendations for Individuals Confirmed to have, or Being Evaluated for, 2019 Novel Coronavirus (COVID-19) Infection Who Receive Care at Home  Individuals who are confirmed to have, or are being evaluated for, COVID-19 should follow the prevention steps below until a healthcare provider or local or state health department says they can return to normal activities.  Stay home except to get medical care You should restrict activities outside your home, except for getting medical care. Do not go to work, school, or public areas, and do not use public transportation or taxis.  Call ahead before visiting your doctor Before your medical appointment, call the healthcare provider and tell them that you have, or are being evaluated for, COVID-19 infection. This will help the healthcare providers office take steps to keep other people from getting infected. Ask your healthcare provider to call the local or state health department.  Monitor your symptoms Seek prompt medical attention if your illness is worsening (e.g., difficulty breathing). Before going to your medical appointment,  call the healthcare provider and tell them that you have, or are being evaluated for, COVID-19 infection. Ask your healthcare provider to call the local or state health department.  Wear a facemask You should wear a facemask that covers your nose and mouth when you are in the same room with other people and when you visit a healthcare provider. People who live with or visit you should also wear a facemask while they are in the same room with you.  Separate yourself from other people in your home As much as possible, you should stay in a different room from other people in your home. Also, you should use a separate bathroom, if available.  Avoid sharing household items You should not share dishes, drinking glasses, cups, eating utensils, towels, bedding, or other items with other people in your home. After using these items, you should wash them thoroughly with soap and water.  Cover your coughs and sneezes Cover your mouth and nose with a tissue when you cough or sneeze, or you can cough or sneeze into your sleeve. Throw used tissues in a lined trash can, and immediately wash your hands with soap and water for at least 20 seconds or use an alcohol-based hand rub.  Wash your [facility] your hands often and thoroughly with soap and water for at least 20 seconds. You can use an alcohol-based hand sanitizer if soap and water are not available and if your hands are not visibly dirty. Avoid touching your eyes, nose, and mouth with unwashed hands.   Prevention Steps for Caregivers and Household Members of Individuals Confirmed to have,  or Being Evaluated for, COVID-19 Infection Being Cared for in the Home  If you live with, or provide care at home for, a person confirmed to have, or being evaluated for, COVID-19 infection please follow these guidelines to prevent infection:  Follow healthcare providers instructions Make sure that you understand and can help the patient follow any  healthcare provider instructions for all care.  Provide for the patients basic needs You should help the patient with basic needs in the home and provide support for getting groceries, prescriptions, and other personal needs.  Monitor the patients symptoms If they are getting sicker, call his or her medical provider and tell them that the patient has, or is being evaluated for, COVID-19 infection. This will help the healthcare providers office take steps to keep other people from getting infected. Ask the healthcare provider to call the local or state health department.  Limit the number of people who have contact with the patient If possible, have only one caregiver for the patient. Other household members should stay in another home or place of residence. If this is not possible, they should stay in another room, or be separated from the patient as much as possible. Use a separate bathroom, if available. Restrict visitors who do not have an essential need to be in the home.  Keep older adults, very young children, and other sick people away from the patient Keep older adults, very young children, and those who have compromised immune systems or chronic health conditions away from the patient. This includes people with chronic heart, lung, or kidney conditions, diabetes, and cancer.  Ensure good ventilation Make sure that shared spaces in the home have good air flow, such as from an air conditioner or an opened window, weather permitting.  Wash your hands often Wash your hands often and thoroughly with soap and water for at least 20 seconds. You can use an alcohol based hand sanitizer if soap and water are not available and if your hands are not visibly dirty. Avoid touching your eyes, nose, and mouth with unwashed hands. Use disposable paper towels to dry your hands. If not available, use dedicated cloth towels and replace them when they become wet.  Wear a facemask and gloves Wear  a disposable facemask at all times in the room and gloves when you touch or have contact with the patients blood, body fluids, and/or secretions or excretions, such as sweat, saliva, sputum, nasal mucus, vomit, urine, or feces.  Ensure the mask fits over your nose and mouth tightly, and do not touch it during use. Throw out disposable facemasks and gloves after using them. Do not reuse. Wash your hands immediately after removing your facemask and gloves. If your personal clothing becomes contaminated, carefully remove clothing and launder. Wash your hands after handling contaminated clothing. Place all used disposable facemasks, gloves, and other waste in a lined container before disposing them with other household waste. Remove gloves and wash your hands immediately after handling these items.  Do not share dishes, glasses, or other household items with the patient Avoid sharing household items. You should not share dishes, drinking glasses, cups, eating utensils, towels, bedding, or other items with a patient who is confirmed to have, or being evaluated for, COVID-19 infection. After the person uses these items, you should wash them thoroughly with soap and water.  Wash laundry thoroughly Immediately remove and wash clothes or bedding that have blood, body fluids, and/or secretions or excretions, such as sweat, saliva, sputum, nasal  mucus, vomit, urine, or feces, on them. Wear gloves when handling laundry from the patient. Read and follow directions on labels of laundry or clothing items and detergent. In general, wash and dry with the warmest temperatures recommended on the label.  Clean all areas the individual has used often Clean all touchable surfaces, such as counters, tabletops, doorknobs, bathroom fixtures, toilets, phones, keyboards, tablets, and bedside tables, every day. Also, clean any surfaces that may have blood, body fluids, and/or secretions or excretions on them. Wear gloves  when cleaning surfaces the patient has come in contact with. Use a diluted bleach solution (e.g., dilute bleach with 1 part bleach and 10 parts water) or a household disinfectant with a label that says EPA-registered for coronaviruses. To make a bleach solution at home, add 1 tablespoon of bleach to 1 quart (4 cups) of water. For a larger supply, add  cup of bleach to 1 gallon (16 cups) of water. Read labels of cleaning products and follow recommendations provided on product labels. Labels contain instructions for safe and effective use of the cleaning product including precautions you should take when applying the product, such as wearing gloves or eye protection and making sure you have good ventilation during use of the product. Remove gloves and wash hands immediately after cleaning.  Monitor yourself for signs and symptoms of illness Caregivers and household members are considered close contacts, should monitor their health, and will be asked to limit movement outside of the home to the extent possible. Follow the monitoring steps for close contacts listed on the symptom monitoring form.   ? If you have additional questions, contact your local health department or call the epidemiologist on call at [phone] (available 24/7). ? This guidance is subject to change. For the most up-to-date guidance from [facility], please refer to their website: YouBlogs.pl

## 2019-03-03 NOTE — ED Provider Notes (Signed)
[facility] EMERGENCY DEPARTMENT Provider Note   CSN: [id number] Arrival date & time: 03/03/19  1426     History   Chief Complaint Chief Complaint  Patient presents with   Shortness of Breath    HPI Andrew Love is a 53 y.o. male with a history significant for HTN, CAD and known exposure to Covid 43, wife is currently hospitalized at [facility], presenting with increased shortness of breath with exertion and mid bilateral aching back pain which reminds him of a prior bout of pneumonia. He also reports intermittent fever to 101, nausea without emesis and had diarrhea but now resolved.  He has a pulse ox at home and has measured levels as low as 84% at home when ambulating.  He reports occasional dry cough, although it was productive a few days ago.  He has been pushing fluids, also taking ibuprofen for fever reduction.  Had a negative covid test 5 days ago.  He denies dysuria, abdominal pain, rash, neck pain or stiffness.  Reports his taste and smell is "different" but present.     HPI  Past Medical History:  Diagnosis Date   Arthritis    Back pain    chronic   Bronchitis    history of   CAD (coronary artery disease)    Chronic back pain    Chronic insomnia    Per medical history form dated 06/13/10.   Chronic left hip pain    Colitis    Per medical history from dated 06/13/10.   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   Sigmoid colon ulcer    Rectal polyps   Sleep apnea with use of continuous positive airway pressure (CPAP)     Patient Active Problem List   Diagnosis Date Noted   Lumbar radiculopathy 01/26/2019   LUQ pain 06/19/2016   Symptomatic cholelithiasis 08/30/2015   Preoperative cardiovascular examination    Coronary  artery disease involving native coronary artery of native heart without angina pectoris    Gastroenteritis 08/26/2015   Cholelithiases 08/24/2015   Abdominal pain 08/24/2015   Cholecystitis, acute 08/24/2015   Diarrhea 08/24/2015   Acute cholecystitis 08/24/2015   Nausea without vomiting 04/08/2015   Diverticulitis of colon    Cerebral thrombosis with cerebral infarction ([place]) 03/21/2014   Stroke ([place]) 03/21/2014   Essential hypertension 03/21/2014   TIA (transient ischemic attack)    HLD (hyperlipidemia)    Thyroid activity decreased    Colitis 11/20/2012   CAD (coronary artery disease)    Hip pain 08/04/2011   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   CERVICAL SPINE SURGERY     C4, C5, C6 spinal fusion   CHOLECYSTECTOMY N/A 08/31/2015   Procedure: LAPAROSCOPIC CHOLECYSTECTOMY WITH INTRAOPERATIVE CHOLANGIOGRAM;  Surgeon: Excell Seltzer, MD;  Location: [facility];  Service: General;  Laterality: N/A;   COLONOSCOPY  07/2008   Colitis,NSAID v. Ischemia,malrotation of the gut,Diverticulosis(L),hyperplastic  COLONOSCOPY N/A 12/13/2012   Dr. Oneida Alar: Normal TI, mild sigmoid diverticulosis, hemorrhoids, 2 polyps (tubular adenoma). Random colon bx negative. Next TCS 12/2022 with Fentanyl/phenergan   ESOPHAGOGASTRODUODENOSCOPY N/A 04/16/2014   RMR: Erosive reflux esophagitis. Non critical Schzki's ring not manipulated. Small hiatal hernia. Abnormal gastirc mucosa of doubtful signigicance status post biopsy. I suspect trivial upper GI bleed. Recent abdominal pain presumably secondary to a protracted bout of diverticulitis. CT scan November 23 revealed improvemetn without complication. He finished  his antibiotics 2 days age. Left sided ab   JOINT REPLACEMENT     LAPAROSCOPIC SIGMOID COLECTOMY  2012   Dr. Fanny Skates: recurrent sigmoid diverticulitis   LAPAROSCOPIC SIGMOID COLECTOMY  2012   recurernt sigmoid diverticulitis, Dr. Irish Elders CUFF REPAIR     Left - per medical history form dated 06/13/10.   SHOULDER SURGERY     Left   THYROIDECTOMY     Per medical history form dated 06/13/10.   TOTAL HIP ARTHROPLASTY  07/04/2011   Procedure: TOTAL HIP ARTHROPLASTY;  Surgeon: Johnny Bridge, MD;  Location: [place];  Service: Orthopedics;  Laterality: Right;        Home Medications    Prior to Admission medications   Medication Sig Start Date End Date Taking? Authorizing Provider  benzonatate (TESSALON) 100 MG capsule Take 1 capsule (100 mg total) by mouth every 8 (eight) hours. 02/26/19   Wurst, [place], PA-C  cetirizine (ZYRTEC) 10 MG chewable tablet Chew 1 tablet (10 mg total) by mouth daily. 02/26/19   Wurst, [place], PA-C  diphenhydrAMINE (BENADRYL) 25 MG tablet Take 25-50 mg by mouth every 6 (six) hours as needed for allergies.     [provider]  EPINEPHrine (EPIPEN) 0.3 mg/0.3 mL SOAJ Inject 0.3 mLs (0.3 mg total) into the muscle as needed. Patient taking differently: Inject 0.3 mg into the muscle daily as needed (allergic reaction).  12/01/12   Teressa Lower, MD  fluticasone (FLONASE) 50 MCG/ACT nasal spray Place 2 sprays into both nostrils daily. 02/26/19   Wurst, [place], PA-C  hydrocortisone cream 1 % Apply 1 application topically once as needed for itching.    [provider]  ibuprofen (ADVIL) 200 MG tablet Take 800 mg by mouth every 6 (six) hours as needed for moderate pain.    [provider]  ibuprofen (ADVIL,MOTRIN) 600 MG tablet Take 1 tablet (600 mg total) by mouth every 6 (six) hours as needed. Patient not taking: Reported on 01/26/2019 07/15/17   Noemi Chapel, MD  levothyroxine (SYNTHROID, LEVOTHROID) 300 MCG tablet Take  300 mcg by mouth daily. 03/26/14   [provider]  meclizine (ANTIVERT) 25 MG tablet Take 1 tablet (25 mg total) by mouth 3 (three) times daily as needed for dizziness. 04/09/17   Isla Pence, MD  methocarbamol (ROBAXIN) 500 MG tablet Take 1,000 mg by mouth 4 (four) times daily.    [provider]  olmesartan (BENICAR) 20 MG tablet Take 20 mg by mouth daily.     [provider]  ondansetron (ZOFRAN) 4 MG tablet Take 1 tablet (4 mg total) by mouth every 6 (six) hours. 01/07/19   Wurst, [place], PA-C  Oxycodone HCl 10 MG TABS Take 0.5-1 tablets (5-10 mg total) by mouth every 6 (six) hours as needed for severe pain (Post operative pain). 01/25/19   Margarita Mail, PA-C  oxyCODONE-acetaminophen (PERCOCET) 5-325 MG tablet Take 1-2 tablets by mouth every 6 (six) hours as needed for severe pain. 07/26/18   Mesner, Corene Cornea,  MD  pantoprazole (PROTONIX) 40 MG tablet TAKE 1 TABLET BY MOUTH ONCE DAILY Patient taking differently: Take 40 mg by mouth daily.  03/06/18   Annitta Needs, NP  zolpidem (AMBIEN) 10 MG tablet Take 10 mg by mouth at bedtime.     [provider]  promethazine (PHENERGAN) 25 MG tablet Take 1 tablet (25 mg total) by mouth every 6 (six) hours as needed for nausea or vomiting. 07/20/17 01/07/19  Evalee Jefferson, PA-C    Family History Family History  Problem Relation Age of Onset   Cancer Mother        breast cancer - per medical history form dated 06/13/10.   Diverticulitis Mother    Hypertension Mother    Cancer Father        skin - per medical history form dated 06/13/10.   Hypertension Father    Anesthesia problems Neg Hx    Hypotension Neg Hx    Malignant hyperthermia Neg Hx    Pseudochol deficiency Neg Hx    Colon cancer Neg Hx     Social History Social History   Tobacco Use   Smoking status: Current Every Day Smoker    Packs/day: 0.50    Years: 18.00    Pack years: 9.00    Types: Cigarettes   Smokeless tobacco: Never Used    Tobacco comment:    Substance Use Topics   Alcohol use: No    Alcohol/week: 0.0 standard drinks   Drug use: No     Allergies   Other, Iohexol, Shrimp [shellfish allergy], Wheat bran, and Yeast-related products   Review of Systems Review of Systems  Constitutional: Positive for chills and fever.  HENT: Negative for congestion, ear pain, rhinorrhea, sinus pressure, sore throat, trouble swallowing and voice change.   Eyes: Negative for discharge.  Respiratory: Positive for cough, chest tightness and shortness of breath. Negative for wheezing and stridor.   Cardiovascular: Negative for chest pain.  Gastrointestinal: Positive for diarrhea and nausea. Negative for abdominal pain and vomiting.  Genitourinary: Negative.   Musculoskeletal: Positive for back pain. Negative for neck pain and neck stiffness.  Skin: Negative.      Physical Exam Updated Vital Signs BP (!) 150/116 (BP Location: Right Arm)    Pulse 87    Temp 98.6 F (37 C) (Oral)    Resp (!) 23    Ht 6' (1.829 m)    Wt 105.2 kg    SpO2 95%    BMI 31.46 kg/m   Physical Exam Vitals signs and nursing note reviewed.  Constitutional:      Appearance: He is well-developed.  HENT:     Head: Normocephalic and atraumatic.  Eyes:     Conjunctiva/sclera: Conjunctivae normal.  Neck:     Musculoskeletal: Normal range of motion.  Cardiovascular:     Rate and Rhythm: Normal rate and regular rhythm.     Heart sounds: Normal heart sounds.  Pulmonary:     Effort: Pulmonary effort is normal.     Breath sounds: Normal breath sounds. No wheezing, rhonchi or rales.  Abdominal:     General: Bowel sounds are normal.     Palpations: Abdomen is soft.     Tenderness: There is no abdominal tenderness.  Musculoskeletal: Normal range of motion.  Skin:    General: Skin is warm and dry.  Neurological:     Mental Status: He is alert.      ED Treatments / Results  Labs (all labs ordered are listed,  but only abnormal results are  displayed) Labs Reviewed  SARS CORONAVIRUS 2 (TAT 6-24 HRS)  CBC WITH DIFFERENTIAL/PLATELET  BASIC METABOLIC PANEL  URINALYSIS, ROUTINE W REFLEX MICROSCOPIC    EKG EKG Interpretation  Date/Time:  Monday March 03 2019 15:27:02 EDT Ventricular Rate:  95 PR Interval:    QRS Duration: 87 QT Interval:  343 QTC Calculation: 432 R Axis:   -44 Text Interpretation: Sinus rhythm Left axis deviation Nonspecific ST changes. Similar to prior. No STEMI Reconfirmed by Nanda Quinton [phone]) on 03/03/2019 7:25:13 PM   Radiology Dg Chest Port 1 View  Result Date: 03/03/2019 CLINICAL DATA:  Shortness of breath, wife is COVID-19 positive and hospitalized, patient tested negative on Friday, increased shortness of breath with exertion, oxygen desaturation EXAM: PORTABLE CHEST 1 VIEW COMPARISON:  Portable exam 1752 hours compared to 04/09/2017 FINDINGS: Normal heart size, mediastinal contours, and pulmonary vascularity. Question mild hazy infiltrate or atelectasis at LEFT base. Remaining lungs clear. No pleural effusion or pneumothorax. Prior cervical spine fusion. No acute osseous findings. IMPRESSION: Question minimal hazy infiltrate or atelectasis at LEFT base. Electronically Signed   By: Lavonia Dana M.D.   On: 03/03/2019 18:24    Procedures Procedures (including critical care time)  Medications Ordered in ED Medications  cefTRIAXone (ROCEPHIN) injection 1 g (has no administration in time range)  azithromycin (ZITHROMAX) tablet 500 mg (has no administration in time range)  albuterol (VENTOLIN HFA) 108 (90 Base) MCG/ACT inhaler 2 puff (2 puffs Inhalation Given 03/03/19 1735)     Initial Impression / Assessment and Plan / ED Course  I have reviewed the triage vital signs and the nursing notes.  Pertinent labs & imaging results that were available during my care of the patient were reviewed by me and considered in my medical decision making (see chart for details).        Pt with positive  Covid exposure, fever, sob, pain mid back L>R, xray suggesting  Early LLL pneumonia.  Not pattern c/w covid.  Pt was given IM rocephin, z pack started.  Albuterol mdi given, pt ambulated in dept with pulse ox with no hypoxia.  He was felt stable for dc home. Strict return precautions including increased sob or weakness.  Home tx's discussed.    Repeat covid 19 testing pending, should result by tomorrow. Discussed continued home isolation until this test result is known. Outlined home isolation guidelines.  RADARIUS SCHLICHER was evaluated in Emergency Department on 03/03/2019 for the symptoms described in the history of present illness. He was evaluated in the context of the global COVID-19 pandemic, which necessitated consideration that the patient might be at risk for infection with the SARS-CoV-2 virus that causes COVID-19. Institutional protocols and algorithms that pertain to the evaluation of patients at risk for COVID-19 are in a state of rapid change based on information released by regulatory bodies including the CDC and federal and state organizations. These policies and algorithms were followed during the patient's care in the ED.   Final Clinical Impressions(s) / ED Diagnoses   Final diagnoses:  Community acquired pneumonia of left lower lobe of lung  Close exposure to COVID-19 virus    ED Discharge Orders    None       Landis Martins 03/03/19 Tressia Miners, MD 03/04/19 1053

## 2019-03-04 ENCOUNTER — Encounter (HOSPITAL_COMMUNITY): Payer: Self-pay

## 2019-03-04 ENCOUNTER — Emergency Department (HOSPITAL_COMMUNITY)
Admission: EM | Admit: 2019-03-04 | Discharge: 2019-03-04 | Disposition: A | Discharge status: Home / Self Care | Payer: BC Managed Care – PPO | Source: Home / Self Care | Attending: Emergency Medicine | Admitting: Emergency Medicine

## 2019-03-04 ENCOUNTER — Ambulatory Visit (HOSPITAL_COMMUNITY): Payer: BC Managed Care – PPO | Admitting: Physical Therapy

## 2019-03-04 ENCOUNTER — Telehealth (HOSPITAL_COMMUNITY): Payer: Self-pay | Admitting: Physical Therapy

## 2019-03-04 ENCOUNTER — Other Ambulatory Visit: Payer: Self-pay

## 2019-03-04 DIAGNOSIS — E039 Hypothyroidism, unspecified: Secondary | ICD-10-CM | POA: Insufficient documentation

## 2019-03-04 DIAGNOSIS — I10 Essential (primary) hypertension: Secondary | ICD-10-CM | POA: Insufficient documentation

## 2019-03-04 DIAGNOSIS — J1289 Other viral pneumonia: Secondary | ICD-10-CM | POA: Insufficient documentation

## 2019-03-04 DIAGNOSIS — J1282 Pneumonia due to coronavirus disease 2019: Secondary | ICD-10-CM

## 2019-03-04 DIAGNOSIS — Z8673 Personal history of transient ischemic attack (TIA), and cerebral infarction without residual deficits: Secondary | ICD-10-CM | POA: Insufficient documentation

## 2019-03-04 DIAGNOSIS — U071 COVID-19: Secondary | ICD-10-CM

## 2019-03-04 DIAGNOSIS — Z79899 Other long term (current) drug therapy: Secondary | ICD-10-CM | POA: Insufficient documentation

## 2019-03-04 DIAGNOSIS — F17210 Nicotine dependence, cigarettes, uncomplicated: Secondary | ICD-10-CM | POA: Insufficient documentation

## 2019-03-04 DIAGNOSIS — I2510 Atherosclerotic heart disease of native coronary artery without angina pectoris: Secondary | ICD-10-CM | POA: Insufficient documentation

## 2019-03-04 DIAGNOSIS — Z96641 Presence of right artificial hip joint: Secondary | ICD-10-CM | POA: Insufficient documentation

## 2019-03-04 DIAGNOSIS — R0789 Other chest pain: Secondary | ICD-10-CM | POA: Insufficient documentation

## 2019-03-04 LAB — SARS CORONAVIRUS 2 (TAT 6-24 HRS): SARS Coronavirus 2: POSITIVE — AB

## 2019-03-04 NOTE — ED Provider Notes (Signed)
[facility] EMERGENCY DEPARTMENT Provider Note   CSN: [id number] Arrival date & time: 03/04/19  1411     History   Chief Complaint Chief Complaint  Patient presents with   Shortness of Breath    HPI Andrew Love is a 53 y.o. male.     Patient is a 53 year old male with history of coronary artery disease, diverticulitis, hypertension, hypothyroidism.  He presents today for evaluation of URI symptoms.  Patient has been not feeling well for the past week.  He was seen yesterday at the ER with similar symptoms.  His x-ray showed possible pneumonia and was treated with antibiotics.  Today he found out that his Covid test returned positive.  The nurse that called to notify him advised him to come to the ER for treatment.  Patient describes some heaviness in his chest and feels as though it is difficult to take a deep breath.  The history is provided by the patient.  Shortness of Breath Severity:  Mild Onset quality:  Gradual Duration:  1 week Timing:  Constant Progression:  Worsening Chronicity:  New Relieved by:  Nothing Worsened by:  Nothing   Past Medical History:  Diagnosis Date   Arthritis    Back pain    chronic   Bronchitis    history of   CAD (coronary artery disease)    Chronic back pain    Chronic insomnia    Per medical history form dated 06/13/10.   Chronic left hip pain    Colitis    Per medical history from dated 06/13/10.   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   Sigmoid colon ulcer    Rectal polyps   Sleep apnea with use of continuous positive airway pressure (CPAP)     Patient Active Problem List   Diagnosis Date Noted   Lumbar radiculopathy 01/26/2019   LUQ pain 06/19/2016   Symptomatic cholelithiasis  08/30/2015   Preoperative cardiovascular examination    Coronary artery disease involving native coronary artery of native heart without angina pectoris    Gastroenteritis 08/26/2015   Cholelithiases 08/24/2015   Abdominal pain 08/24/2015   Cholecystitis, acute 08/24/2015   Diarrhea 08/24/2015   Acute cholecystitis 08/24/2015   Nausea without vomiting 04/08/2015   Diverticulitis of colon    Cerebral thrombosis with cerebral infarction ([place]) 03/21/2014   Stroke ([place]) 03/21/2014   Essential hypertension 03/21/2014   TIA (transient ischemic attack)    HLD (hyperlipidemia)    Thyroid activity decreased    Colitis 11/20/2012   CAD (coronary artery disease)    Hip pain 08/04/2011   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   CERVICAL SPINE SURGERY     C4, C5, C6 spinal fusion   CHOLECYSTECTOMY N/A 08/31/2015   Procedure: LAPAROSCOPIC CHOLECYSTECTOMY WITH INTRAOPERATIVE CHOLANGIOGRAM;  Surgeon: Excell Seltzer, MD;  Location: [facility];  Service: General;  Laterality: N/A;   COLONOSCOPY  07/2008  Colitis,NSAID v. Ischemia,malrotation of the gut,Diverticulosis(L),hyperplastic   COLONOSCOPY N/A 12/13/2012   Dr. Oneida Alar: Normal TI, mild sigmoid diverticulosis, hemorrhoids, 2 polyps (tubular adenoma). Random colon bx negative. Next TCS 12/2022 with Fentanyl/phenergan   ESOPHAGOGASTRODUODENOSCOPY N/A 04/16/2014   RMR: Erosive reflux esophagitis. Non critical Schzki's ring not manipulated. Small hiatal hernia. Abnormal gastirc mucosa of doubtful signigicance status post biopsy. I suspect trivial upper GI bleed. Recent abdominal pain presumably secondary to a protracted bout of diverticulitis. CT scan  November 23 revealed improvemetn without complication. He finished his antibiotics 2 days age. Left sided ab   JOINT REPLACEMENT     LAPAROSCOPIC SIGMOID COLECTOMY  2012   Dr. Fanny Skates: recurrent sigmoid diverticulitis   LAPAROSCOPIC SIGMOID COLECTOMY  2012   recurernt sigmoid diverticulitis, Dr. Irish Elders CUFF REPAIR     Left - per medical history form dated 06/13/10.   SHOULDER SURGERY     Left   THYROIDECTOMY     Per medical history form dated 06/13/10.   TOTAL HIP ARTHROPLASTY  07/04/2011   Procedure: TOTAL HIP ARTHROPLASTY;  Surgeon: Johnny Bridge, MD;  Location: [place];  Service: Orthopedics;  Laterality: Right;        Home Medications    Prior to Admission medications   Medication Sig Start Date End Date Taking? Authorizing Provider  benzonatate (TESSALON) 100 MG capsule Take 1 capsule (100 mg total) by mouth every 8 (eight) hours. 02/26/19   Wurst, [place], PA-C  cetirizine (ZYRTEC) 10 MG chewable tablet Chew 1 tablet (10 mg total) by mouth daily. 02/26/19   Wurst, [place], PA-C  diphenhydrAMINE (BENADRYL) 25 MG tablet Take 25-50 mg by mouth every 6 (six) hours as needed for allergies.     [provider]  EPINEPHrine (EPIPEN) 0.3 mg/0.3 mL SOAJ Inject 0.3 mLs (0.3 mg total) into the muscle as needed. Patient taking differently: Inject 0.3 mg into the muscle daily as needed (allergic reaction).  12/01/12   Teressa Lower, MD  fluticasone (FLONASE) 50 MCG/ACT nasal spray Place 2 sprays into both nostrils daily. 02/26/19   Wurst, [place], PA-C  hydrocortisone cream 1 % Apply 1 application topically once as needed for itching.    [provider]  ibuprofen (ADVIL) 200 MG tablet Take 800 mg by mouth every 6 (six) hours as needed for moderate pain.    [provider]  ibuprofen (ADVIL,MOTRIN) 600 MG tablet Take 1 tablet (600 mg total) by mouth every 6 (six) hours as needed. Patient not taking: Reported on 01/26/2019 07/15/17   Noemi Chapel, MD  levothyroxine (SYNTHROID, LEVOTHROID) 300 MCG tablet Take 300 mcg by mouth daily. 03/26/14   [provider]  meclizine (ANTIVERT) 25 MG tablet Take 1 tablet (25 mg total) by mouth 3 (three) times daily as needed for dizziness. 04/09/17   Isla Pence, MD  methocarbamol (ROBAXIN) 500 MG tablet Take 1,000 mg by mouth 4 (four) times daily.    [provider]  olmesartan (BENICAR) 20 MG tablet Take 20 mg by mouth daily.     [provider]  ondansetron (ZOFRAN) 4 MG tablet Take 1 tablet (4 mg total) by mouth every 6 (six) hours. 01/07/19   Wurst, [place], PA-C  Oxycodone HCl 10 MG TABS Take 0.5-1 tablets (5-10 mg total) by mouth every 6 (six) hours as needed for severe pain (Post operative pain). 01/25/19   Margarita Mail, PA-C  oxyCODONE-acetaminophen (PERCOCET) 5-325 MG tablet Take 1-2 tablets by mouth every 6 (six) hours as needed  for severe pain. 07/26/18   Mesner, Corene Cornea, MD  pantoprazole (PROTONIX) 40 MG tablet TAKE 1 TABLET BY MOUTH ONCE DAILY Patient taking differently: Take 40 mg by mouth daily.  03/06/18   Annitta Needs, NP  zolpidem (AMBIEN) 10 MG tablet Take 10 mg by mouth at bedtime.     [provider]  promethazine (PHENERGAN) 25 MG tablet Take 1 tablet (25 mg total) by mouth every 6 (six) hours as needed for nausea or vomiting. 07/20/17 01/07/19  Evalee Jefferson, PA-C    Family History Family History  Problem Relation Age of Onset   Cancer Mother        breast cancer - per medical history form dated 06/13/10.   Diverticulitis Mother    Hypertension Mother    Cancer Father        skin - per medical history form dated 06/13/10.   Hypertension Father    Anesthesia problems Neg Hx    Hypotension Neg Hx    Malignant hyperthermia Neg Hx    Pseudochol deficiency Neg Hx    Colon cancer Neg Hx     Social History Social History   Tobacco Use   Smoking status: Current Every Day Smoker    Packs/day: 0.50    Years: 18.00    Pack  years: 9.00    Types: Cigarettes   Smokeless tobacco: Never Used   Tobacco comment:    Substance Use Topics   Alcohol use: No    Alcohol/week: 0.0 standard drinks   Drug use: No     Allergies   Other, Iohexol, Shrimp [shellfish allergy], Wheat bran, and Yeast-related products   Review of Systems Review of Systems  Respiratory: Positive for shortness of breath.   All other systems reviewed and are negative.    Physical Exam Updated Vital Signs BP 133/86 (BP Location: Right Arm)    Pulse 88    Temp 98.4 F (36.9 C) (Oral)    Resp 18    Ht 6' (1.829 m)    Wt 105.2 kg    SpO2 96%    BMI 31.46 kg/m   Physical Exam Vitals signs and nursing note reviewed.  Constitutional:      General: He is not in acute distress.    Appearance: He is well-developed. He is not diaphoretic.  HENT:     Head: Normocephalic and atraumatic.  Neck:     Musculoskeletal: Normal range of motion and neck supple.  Cardiovascular:     Rate and Rhythm: Normal rate and regular rhythm.     Heart sounds: No murmur. No friction rub.  Pulmonary:     Effort: Pulmonary effort is normal. No respiratory distress.     Breath sounds: Normal breath sounds. No wheezing or rales.  Abdominal:     General: Bowel sounds are normal. There is no distension.     Palpations: Abdomen is soft.     Tenderness: There is no abdominal tenderness.  Musculoskeletal: Normal range of motion.  Skin:    General: Skin is warm and dry.  Neurological:     Mental Status: He is alert and oriented to person, place, and time.     Coordination: Coordination normal.      ED Treatments / Results  Labs (all labs ordered are listed, but only abnormal results are displayed) Labs Reviewed - No data to display  EKG None  Radiology Dg Chest [facility] 1 View  Result Date: 03/03/2019 CLINICAL DATA:  Shortness of breath, wife is COVID-19  positive and hospitalized, patient tested negative on Friday, increased shortness of breath with  exertion, oxygen desaturation EXAM: PORTABLE CHEST 1 VIEW COMPARISON:  Portable exam 1752 hours compared to 04/09/2017 FINDINGS: Normal heart size, mediastinal contours, and pulmonary vascularity. Question mild hazy infiltrate or atelectasis at LEFT base. Remaining lungs clear. No pleural effusion or pneumothorax. Prior cervical spine fusion. No acute osseous findings. IMPRESSION: Question minimal hazy infiltrate or atelectasis at LEFT base. Electronically Signed   By: Lavonia Dana M.D.   On: 03/03/2019 18:24    Procedures Procedures (including critical care time)  Medications Ordered in ED Medications - No data to display   Initial Impression / Assessment and Plan / ED Course  I have reviewed the triage vital signs and the nursing notes.  Pertinent labs & imaging results that were available during my care of the patient were reviewed by me and considered in my medical decision making (see chart for details).  Patient presents with complaints of not feeling well and positive Covid test.  Patient appears clinically well.  He is in no respiratory distress with oxygen saturations of 99% while I am in the room.  He is not tachycardic nor is he tachypneic.  His breath sounds are essentially clear.  I see no indication at this time for admission.  I have instructed the patient to drink plenty of fluids, take Tylenol for fever, and rest.  He is to return to the ER if he develops difficulty breathing, severe chest pain, or other new and concerning symptoms.  Final Clinical Impressions(s) / ED Diagnoses   Final diagnoses:  None    ED Discharge Orders    None       Veryl Speak, MD 03/04/19 1810

## 2019-03-04 NOTE — Discharge Instructions (Addendum)
Drink plenty of fluids and get plenty of rest.  Tylenol 1000 mg every 6 hours as needed for fever.  Continue use of your inhaler as needed.  Return to the emergency department if you develop worsening breathing, severe chest pain, or other new and concerning symptoms.       Infection Prevention Recommendations for Individuals Confirmed to have, or Being Evaluated for, 2019 Novel Coronavirus (COVID-19) Infection Who Receive Care at Home  Individuals who are confirmed to have, or are being evaluated for, COVID-19 should follow the prevention steps below until a healthcare provider or local or state health department says they can return to normal activities.  Stay home except to get medical care You should restrict activities outside your home, except for getting medical care. Do not go to work, school, or public areas, and do not use public transportation or taxis.  Call ahead before visiting your doctor Before your medical appointment, call the healthcare provider and tell them that you have, or are being evaluated for, COVID-19 infection. This will help the healthcare providers office take steps to keep other people from getting infected. Ask your healthcare provider to call the local or state health department.  Monitor your symptoms Seek prompt medical attention if your illness is worsening (e.g., difficulty breathing). Before going to your medical appointment, call the healthcare provider and tell them that you have, or are being evaluated for, COVID-19 infection. Ask your healthcare provider to call the local or state health department.  Wear a facemask You should wear a facemask that covers your nose and mouth when you are in the same room with other people and when you visit a healthcare provider. People who live with or visit you should also wear a facemask while they are in the same room with you.  Separate yourself from other people in your home As much as possible,  you should stay in a different room from other people in your home. Also, you should use a separate bathroom, if available.  Avoid sharing household items You should not share dishes, drinking glasses, cups, eating utensils, towels, bedding, or other items with other people in your home. After using these items, you should wash them thoroughly with soap and water.  Cover your coughs and sneezes Cover your mouth and nose with a tissue when you cough or sneeze, or you can cough or sneeze into your sleeve. Throw used tissues in a lined trash can, and immediately wash your hands with soap and water for at least 20 seconds or use an alcohol-based hand rub.  Wash your [facility] your hands often and thoroughly with soap and water for at least 20 seconds. You can use an alcohol-based hand sanitizer if soap and water are not available and if your hands are not visibly dirty. Avoid touching your eyes, nose, and mouth with unwashed hands.   Prevention Steps for Caregivers and Household Members of Individuals Confirmed to have, or Being Evaluated for, COVID-19 Infection Being Cared for in the Home  If you live with, or provide care at home for, a person confirmed to have, or being evaluated for, COVID-19 infection please follow these guidelines to prevent infection:  Follow healthcare providers instructions Make sure that you understand and can help the patient follow any healthcare provider instructions for all care.  Provide for the patients basic needs You should help the patient with basic needs in the home and provide support for getting groceries, prescriptions, and other personal needs.  Monitor  the patients symptoms If they are getting sicker, call his or her medical provider and tell them that the patient has, or is being evaluated for, COVID-19 infection. This will help the healthcare providers office take steps to keep other people from getting infected. Ask the healthcare  provider to call the local or state health department.  Limit the number of people who have contact with the patient If possible, have only one caregiver for the patient. Other household members should stay in another home or place of residence. If this is not possible, they should stay in another room, or be separated from the patient as much as possible. Use a separate bathroom, if available. Restrict visitors who do not have an essential need to be in the home.  Keep older adults, very young children, and other sick people away from the patient Keep older adults, very young children, and those who have compromised immune systems or chronic health conditions away from the patient. This includes people with chronic heart, lung, or kidney conditions, diabetes, and cancer.  Ensure good ventilation Make sure that shared spaces in the home have good air flow, such as from an air conditioner or an opened window, weather permitting.  Wash your hands often Wash your hands often and thoroughly with soap and water for at least 20 seconds. You can use an alcohol based hand sanitizer if soap and water are not available and if your hands are not visibly dirty. Avoid touching your eyes, nose, and mouth with unwashed hands. Use disposable paper towels to dry your hands. If not available, use dedicated cloth towels and replace them when they become wet.  Wear a facemask and gloves Wear a disposable facemask at all times in the room and gloves when you touch or have contact with the patients blood, body fluids, and/or secretions or excretions, such as sweat, saliva, sputum, nasal mucus, vomit, urine, or feces.  Ensure the mask fits over your nose and mouth tightly, and do not touch it during use. Throw out disposable facemasks and gloves after using them. Do not reuse. Wash your hands immediately after removing your facemask and gloves. If your personal clothing becomes contaminated, carefully remove  clothing and launder. Wash your hands after handling contaminated clothing. Place all used disposable facemasks, gloves, and other waste in a lined container before disposing them with other household waste. Remove gloves and wash your hands immediately after handling these items.  Do not share dishes, glasses, or other household items with the patient Avoid sharing household items. You should not share dishes, drinking glasses, cups, eating utensils, towels, bedding, or other items with a patient who is confirmed to have, or being evaluated for, COVID-19 infection. After the person uses these items, you should wash them thoroughly with soap and water.  Wash laundry thoroughly Immediately remove and wash clothes or bedding that have blood, body fluids, and/or secretions or excretions, such as sweat, saliva, sputum, nasal mucus, vomit, urine, or feces, on them. Wear gloves when handling laundry from the patient. Read and follow directions on labels of laundry or clothing items and detergent. In general, wash and dry with the warmest temperatures recommended on the label.  Clean all areas the individual has used often Clean all touchable surfaces, such as counters, tabletops, doorknobs, bathroom fixtures, toilets, phones, keyboards, tablets, and bedside tables, every day. Also, clean any surfaces that may have blood, body fluids, and/or secretions or excretions on them. Wear gloves when cleaning surfaces the patient has come  in contact with. Use a diluted bleach solution (e.g., dilute bleach with 1 part bleach and 10 parts water) or a household disinfectant with a label that says EPA-registered for coronaviruses. To make a bleach solution at home, add 1 tablespoon of bleach to 1 quart (4 cups) of water. For a larger supply, add  cup of bleach to 1 gallon (16 cups) of water. Read labels of cleaning products and follow recommendations provided on product labels. Labels contain instructions for safe and  effective use of the cleaning product including precautions you should take when applying the product, such as wearing gloves or eye protection and making sure you have good ventilation during use of the product. Remove gloves and wash hands immediately after cleaning.  Monitor yourself for signs and symptoms of illness Caregivers and household members are considered close contacts, should monitor their health, and will be asked to limit movement outside of the home to the extent possible. Follow the monitoring steps for close contacts listed on the symptom monitoring form.   ? If you have additional questions, contact your local health department or call the epidemiologist on call at [phone] (available 24/7). ? This guidance is subject to change. For the most up-to-date guidance from [facility], please refer to their website: [email or web address][employer]pl

## 2019-03-04 NOTE — ED Triage Notes (Addendum)
Pt presents to ED with SOB. Pt + for Covid. Pt states was seen yesterday and had pneumonia. Pt states his Covid test came back positive and was told to come to ED for treatment.

## 2019-03-04 NOTE — ED Notes (Signed)
Advised to return for worsening symptoms

## 2019-03-04 NOTE — Telephone Encounter (Signed)
Pt did not show for appointment.  Called and spoke to patient who was currently in the ED and has tested positive for COVID.  Pt asked we discharge him at this time and would resume at later date.  Evaluating therapist made aware  Teena Irani, PTA/CLT [phone]

## 2019-03-05 ENCOUNTER — Encounter (HOSPITAL_COMMUNITY): Payer: Self-pay

## 2019-03-05 NOTE — Therapy (Signed)
[facility] [place], [place], [address] Phone: [phone]   Fax:  [phone]  Patient Details  Name: Andrew Love MRN: [id number] Date of Birth: 06-20-1965 Referring Provider:  No ref. provider found  Encounter Date: 03/05/2019   PHYSICAL THERAPY DISCHARGE SUMMARY  Visits from Start of Care: 1  Current functional level related to goals / functional outcomes: Unknown due to recent hospitalization for Covid-19.   Remaining deficits: Unknown due to recent hospitalization for Covid-19.   Education / Equipment: HEP  Plan: Patient agrees to discharge.  Patient goals were not met. Patient is being discharged due to the patient's request.  ?????      Pt requested to be discharged from therapy due to recent hospitalization for covid-19.   Talbot Grumbling PT, DPT 03/05/19, 8:08 AM [place], [place], [address] Phone: [phone]   Fax:  [phone]

## 2019-03-06 ENCOUNTER — Encounter (HOSPITAL_COMMUNITY): Payer: BC Managed Care – PPO

## 2019-03-10 ENCOUNTER — Inpatient Hospital Stay (HOSPITAL_COMMUNITY)
Admission: EM | Admit: 2019-03-10 | Discharge: 2019-03-14 | DRG: 177 | Disposition: A | Discharge status: Home / Self Care | Payer: BC Managed Care – PPO | Attending: Family Medicine | Admitting: Family Medicine

## 2019-03-10 ENCOUNTER — Encounter (HOSPITAL_COMMUNITY): Payer: BC Managed Care – PPO

## 2019-03-10 ENCOUNTER — Encounter (HOSPITAL_COMMUNITY): Payer: Self-pay | Admitting: Emergency Medicine

## 2019-03-10 ENCOUNTER — Other Ambulatory Visit: Payer: Self-pay

## 2019-03-10 ENCOUNTER — Emergency Department (HOSPITAL_COMMUNITY): Payer: BC Managed Care – PPO

## 2019-03-10 DIAGNOSIS — Z803 Family history of malignant neoplasm of breast: Secondary | ICD-10-CM

## 2019-03-10 DIAGNOSIS — Z79899 Other long term (current) drug therapy: Secondary | ICD-10-CM

## 2019-03-10 DIAGNOSIS — Z981 Arthrodesis status: Secondary | ICD-10-CM | POA: Diagnosis not present

## 2019-03-10 DIAGNOSIS — Z91018 Allergy to other foods: Secondary | ICD-10-CM | POA: Diagnosis not present

## 2019-03-10 DIAGNOSIS — Z8249 Family history of ischemic heart disease and other diseases of the circulatory system: Secondary | ICD-10-CM

## 2019-03-10 DIAGNOSIS — E039 Hypothyroidism, unspecified: Secondary | ICD-10-CM | POA: Diagnosis not present

## 2019-03-10 DIAGNOSIS — U071 COVID-19: Secondary | ICD-10-CM | POA: Diagnosis present

## 2019-03-10 DIAGNOSIS — Z9049 Acquired absence of other specified parts of digestive tract: Secondary | ICD-10-CM | POA: Diagnosis not present

## 2019-03-10 DIAGNOSIS — J1289 Other viral pneumonia: Secondary | ICD-10-CM | POA: Diagnosis present

## 2019-03-10 DIAGNOSIS — Z96641 Presence of right artificial hip joint: Secondary | ICD-10-CM | POA: Diagnosis not present

## 2019-03-10 DIAGNOSIS — Z8719 Personal history of other diseases of the digestive system: Secondary | ICD-10-CM

## 2019-03-10 DIAGNOSIS — Z888 Allergy status to other drugs, medicaments and biological substances status: Secondary | ICD-10-CM

## 2019-03-10 DIAGNOSIS — E872 Acidosis, unspecified: Secondary | ICD-10-CM

## 2019-03-10 DIAGNOSIS — Z91013 Allergy to seafood: Secondary | ICD-10-CM

## 2019-03-10 DIAGNOSIS — I10 Essential (primary) hypertension: Secondary | ICD-10-CM | POA: Diagnosis present

## 2019-03-10 DIAGNOSIS — R112 Nausea with vomiting, unspecified: Secondary | ICD-10-CM | POA: Diagnosis not present

## 2019-03-10 DIAGNOSIS — G4733 Obstructive sleep apnea (adult) (pediatric): Secondary | ICD-10-CM | POA: Diagnosis present

## 2019-03-10 DIAGNOSIS — M25552 Pain in left hip: Secondary | ICD-10-CM | POA: Diagnosis not present

## 2019-03-10 DIAGNOSIS — A0839 Other viral enteritis: Secondary | ICD-10-CM | POA: Diagnosis not present

## 2019-03-10 DIAGNOSIS — Z79891 Long term (current) use of opiate analgesic: Secondary | ICD-10-CM

## 2019-03-10 DIAGNOSIS — R0602 Shortness of breath: Secondary | ICD-10-CM

## 2019-03-10 DIAGNOSIS — F17210 Nicotine dependence, cigarettes, uncomplicated: Secondary | ICD-10-CM | POA: Diagnosis present

## 2019-03-10 DIAGNOSIS — M549 Dorsalgia, unspecified: Secondary | ICD-10-CM | POA: Diagnosis present

## 2019-03-10 DIAGNOSIS — R1110 Vomiting, unspecified: Secondary | ICD-10-CM | POA: Diagnosis present

## 2019-03-10 DIAGNOSIS — N179 Acute kidney failure, unspecified: Secondary | ICD-10-CM | POA: Diagnosis not present

## 2019-03-10 DIAGNOSIS — E876 Hypokalemia: Secondary | ICD-10-CM

## 2019-03-10 DIAGNOSIS — G8929 Other chronic pain: Secondary | ICD-10-CM | POA: Diagnosis present

## 2019-03-10 DIAGNOSIS — I2510 Atherosclerotic heart disease of native coronary artery without angina pectoris: Secondary | ICD-10-CM | POA: Diagnosis present

## 2019-03-10 DIAGNOSIS — Z808 Family history of malignant neoplasm of other organs or systems: Secondary | ICD-10-CM | POA: Diagnosis not present

## 2019-03-10 DIAGNOSIS — Z79890 Hormone replacement therapy (postmenopausal): Secondary | ICD-10-CM

## 2019-03-10 DIAGNOSIS — F5104 Psychophysiologic insomnia: Secondary | ICD-10-CM | POA: Diagnosis present

## 2019-03-10 DIAGNOSIS — Z791 Long term (current) use of non-steroidal anti-inflammatories (NSAID): Secondary | ICD-10-CM

## 2019-03-10 DIAGNOSIS — J1282 Pneumonia due to coronavirus disease 2019: Secondary | ICD-10-CM

## 2019-03-10 LAB — COMPREHENSIVE METABOLIC PANEL
ALT: 69 U/L — ABNORMAL HIGH (ref 0–44)
AST: 44 U/L — ABNORMAL HIGH (ref 15–41)
Albumin: 4.4 g/dL (ref 3.5–5.0)
Alkaline Phosphatase: 74 U/L (ref 38–126)
Anion gap: 14 (ref 5–15)
BUN: 21 mg/dL — ABNORMAL HIGH (ref 6–20)
CO2: 19 mmol/L — ABNORMAL LOW (ref 22–32)
Calcium: 8.9 mg/dL (ref 8.9–10.3)
Chloride: 101 mmol/L (ref 98–111)
Creatinine, Ser: 1.29 mg/dL — ABNORMAL HIGH (ref 0.61–1.24)
GFR calc Af Amer: >60 mL/min (ref >60)
GFR calc non Af Amer: >60 mL/min (ref >60)
Glucose, Bld: 139 mg/dL — ABNORMAL HIGH (ref 70–99)
Potassium: 3.2 mmol/L — ABNORMAL LOW (ref 3.5–5.1)
Sodium: 134 mmol/L — ABNORMAL LOW (ref 135–145)
Total Bilirubin: 0.8 mg/dL (ref 0.3–1.2)
Total Protein: 7.9 g/dL (ref 6.5–8.1)

## 2019-03-10 LAB — CBC WITH DIFFERENTIAL/PLATELET
Abs Immature Granulocytes: 0.19 10*3/uL — ABNORMAL HIGH (ref 0.00–0.07)
Basophils Absolute: 0.1 10*3/uL (ref 0.0–0.1)
Basophils Relative: 1 %
Eosinophils Absolute: 0 10*3/uL (ref 0.0–0.5)
Eosinophils Relative: 0 %
HCT: 49.8 % (ref 39.0–52.0)
Hemoglobin: 17.2 g/dL — ABNORMAL HIGH (ref 13.0–17.0)
Immature Granulocytes: 2 %
Lymphocytes Relative: 8 %
Lymphs Abs: 0.9 10*3/uL (ref 0.7–4.0)
MCH: 31.9 pg (ref 26.0–34.0)
MCHC: 34.5 g/dL (ref 30.0–36.0)
MCV: 92.2 fL (ref 80.0–100.0)
Monocytes Absolute: 0.8 10*3/uL (ref 0.1–1.0)
Monocytes Relative: 7 %
Neutro Abs: 9.4 10*3/uL — ABNORMAL HIGH (ref 1.7–7.7)
Neutrophils Relative %: 82 %
Platelets: 209 10*3/uL (ref 150–400)
RBC: 5.4 MIL/uL (ref 4.22–5.81)
RDW: 12.9 % (ref 11.5–15.5)
WBC: 11.4 10*3/uL — ABNORMAL HIGH (ref 4.0–10.5)
nRBC: 0 % (ref 0.0–0.2)

## 2019-03-10 LAB — FIBRINOGEN: Fibrinogen: 573 mg/dL — ABNORMAL HIGH (ref 210–475)

## 2019-03-10 LAB — LACTIC ACID, PLASMA: Lactic Acid, Venous: 2 mmol/L (ref 0.5–1.9)

## 2019-03-10 LAB — FERRITIN: Ferritin: 1200 ng/mL — ABNORMAL HIGH (ref 24–336)

## 2019-03-10 LAB — LACTATE DEHYDROGENASE: LDH: 198 U/L — ABNORMAL HIGH (ref 98–192)

## 2019-03-10 LAB — PROCALCITONIN: Procalcitonin: <0.1 ng/mL

## 2019-03-10 LAB — TRIGLYCERIDES: Triglycerides: 252 mg/dL — ABNORMAL HIGH (ref <150)

## 2019-03-10 LAB — C-REACTIVE PROTEIN: CRP: 4.4 mg/dL — ABNORMAL HIGH (ref <1.0)

## 2019-03-10 LAB — D-DIMER, QUANTITATIVE: D-Dimer, Quant: <0.27 ug/mL-FEU (ref 0.00–0.50)

## 2019-03-10 MED ORDER — ALBUTEROL SULFATE HFA 108 (90 BASE) MCG/ACT IN AERS
2.0000 | INHALATION_SPRAY | RESPIRATORY_TRACT | Status: DC
  Administered 2019-03-10 – 2019-03-14 (×20): 2 via RESPIRATORY_TRACT
  Filled 2019-03-10 (×2): qty 6.7

## 2019-03-10 MED ORDER — AEROCHAMBER Z-STAT PLUS/MEDIUM MISC
1.0000 | Freq: Once | Status: AC
  Administered 2019-03-10: 1

## 2019-03-10 MED ORDER — SODIUM CHLORIDE 0.9 % IV SOLN
1000.0000 mL | INTRAVENOUS | Status: DC
  Administered 2019-03-10 – 2019-03-11 (×3): 1000 mL via INTRAVENOUS

## 2019-03-10 MED ORDER — ONDANSETRON HCL 4 MG/2ML IJ SOLN
4.0000 mg | Freq: Once | INTRAMUSCULAR | Status: AC
  Administered 2019-03-10: 4 mg via INTRAVENOUS
  Filled 2019-03-10: qty 2

## 2019-03-10 NOTE — ED Notes (Signed)
[facility] notified of need for transport at this time.

## 2019-03-10 NOTE — ED Notes (Signed)
CRITICAL VALUE ALERT  Critical Value:  Lactic 2.0  Date & Time Notied:  03/10/2019 2152  Provider Notified: EDP Noemi Chapel  Orders Received/Actions taken: No orders at this time

## 2019-03-10 NOTE — ED Provider Notes (Signed)
[facility] EMERGENCY DEPARTMENT Provider Note   CSN: [id number] Arrival date & time: 03/10/19  2011     History   Chief Complaint Chief Complaint  Patient presents with   Shortness of Breath    HPI Andrew Love is a 53 y.o. male.     HPI  53 year old male, history of recently being diagnosed with coronavirus, presents after his wife had already been transferred to [facility] due to severe coronavirus infection.  He developed symptoms 1 week ago and has had fairly persistent chest pain coughing shortness of breath headache fevers diaphoresis and now has had persistent vomiting for a couple of days.  He has checked his oxygen at home and has had measurements in the 74% range, after he uses his albuterol inhaler it improves however over the last 2 days it is consistently worsened.  He is also taken the prednisone that he was prescribed.  He has worsening chest discomfort bilaterally with ongoing cough and shortness of breath.  Past Medical History:  Diagnosis Date   Arthritis    Back pain    chronic   Bronchitis    history of   CAD (coronary artery disease)    Chronic back pain    Chronic insomnia    Per medical history form dated 06/13/10.   Chronic left hip pain    Colitis    Per medical history from dated 06/13/10.   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   Sigmoid colon ulcer    Rectal polyps   Sleep apnea with use of continuous positive airway pressure (CPAP)     Patient Active Problem List   Diagnosis Date Noted   Vomiting 03/10/2019   COVID-19 virus infection 03/10/2019   Hypokalemia 03/10/2019   AKI (acute kidney injury) ([place]) 99991111   Metabolic acidosis 99991111   Lumbar radiculopathy 01/26/2019    LUQ pain 06/19/2016   Symptomatic cholelithiasis 08/30/2015   Preoperative cardiovascular examination    Coronary artery disease involving native coronary artery of native heart without angina pectoris    Gastroenteritis 08/26/2015   Cholelithiases 08/24/2015   Abdominal pain 08/24/2015   Cholecystitis, acute 08/24/2015   Diarrhea 08/24/2015   Acute cholecystitis 08/24/2015   Nausea without vomiting 04/08/2015   Diverticulitis of colon    Cerebral thrombosis with cerebral infarction ([place]) 03/21/2014   Stroke ([place]) 03/21/2014   Essential hypertension 03/21/2014   TIA (transient ischemic attack)    HLD (hyperlipidemia)    Thyroid activity decreased    Colitis 11/20/2012   CAD (coronary artery disease)    Hip pain 08/04/2011   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   CERVICAL SPINE SURGERY     C4, C5, C6 spinal fusion   CHOLECYSTECTOMY N/A 08/31/2015   Procedure: LAPAROSCOPIC CHOLECYSTECTOMY WITH INTRAOPERATIVE CHOLANGIOGRAM;  Surgeon: Excell Seltzer, MD;  Location: [facility];  Service: General;  Laterality: N/A;   COLONOSCOPY  07/2008   Colitis,NSAID v. Ischemia,malrotation of the gut,Diverticulosis(L),hyperplastic   COLONOSCOPY N/A 12/13/2012   Dr. Oneida Alar: Normal TI, mild sigmoid diverticulosis, hemorrhoids, 2 polyps (tubular adenoma). Random colon bx negative. Next TCS 12/2022 with Fentanyl/phenergan   ESOPHAGOGASTRODUODENOSCOPY N/A 04/16/2014   RMR: Erosive reflux esophagitis. Non critical Schzki's ring not manipulated. Small hiatal hernia. Abnormal gastirc mucosa of doubtful signigicance status post biopsy. I suspect trivial upper GI bleed. Recent abdominal pain presumably  secondary to a protracted bout of diverticulitis. CT scan November 23 revealed improvemetn without complication. He finished his antibiotics 2 days age. Left sided ab   JOINT REPLACEMENT     LAPAROSCOPIC SIGMOID COLECTOMY  2012   Dr. Fanny Skates: recurrent sigmoid diverticulitis   LAPAROSCOPIC SIGMOID COLECTOMY  2012   recurernt sigmoid diverticulitis, Dr. Irish Elders CUFF REPAIR     Left - per medical history form dated 06/13/10.   SHOULDER SURGERY     Left   THYROIDECTOMY     Per medical history form dated 06/13/10.   TOTAL HIP ARTHROPLASTY  07/04/2011   Procedure: TOTAL HIP ARTHROPLASTY;  Surgeon: Johnny Bridge, MD;  Location: [place];  Service: Orthopedics;  Laterality: Right;        Home Medications    Prior to Admission medications   Medication Sig Start Date End Date Taking? Authorizing Provider  acetaminophen (TYLENOL) 500 MG tablet Take 500-1,000 mg by mouth every 6 (six) hours as needed for mild pain or fever.   Yes [provider]  benzonatate (TESSALON) 100 MG capsule Take 1 capsule (100 mg total) by mouth every 8 (eight) hours. Patient taking differently: Take 100 mg by mouth every 8 (eight) hours as needed for cough.  02/26/19  Yes Wurst, [place], PA-C  cetirizine (ZYRTEC) 10 MG chewable tablet Chew 1 tablet (10 mg total) by mouth daily. 02/26/19  Yes Wurst, [place], PA-C  EPINEPHrine (EPIPEN) 0.3 mg/0.3 mL SOAJ Inject 0.3 mLs (0.3 mg total) into the muscle as needed. Patient taking differently: Inject 0.3 mg into the muscle daily as needed (allergic reaction).  12/01/12  Yes Teressa Lower, MD  fluticasone [facility]) 50 MCG/ACT nasal spray Place 2 sprays into both nostrils daily. Patient taking differently: Place 2 sprays into both nostrils daily as needed for allergies.  02/26/19  Yes Wurst, [place], PA-C  hydrocortisone cream 1 % Apply 1 application topically once as needed for itching.   Yes [provider]  ibuprofen (ADVIL) 200 MG tablet  Take 800 mg by mouth every 6 (six) hours as needed for moderate pain.   Yes [provider]  levothyroxine (SYNTHROID, LEVOTHROID) 300 MCG tablet Take 300 mcg by mouth every morning.  03/26/14  Yes [provider]  meclizine (ANTIVERT) 25 MG tablet Take 1 tablet (25 mg total) by mouth 3 (three) times daily as needed for dizziness. 04/09/17  Yes Isla Pence, MD  methylPREDNISolone (MEDROL DOSEPAK) 4 MG TBPK tablet Take 4-24 mg by mouth See admin instructions. Take as directed per package instructions starting on 03/06/2019 for 6 days (6,5,4,3,2,1) 03/06/19  Yes [provider]  olmesartan (BENICAR) 20 MG tablet Take 20 mg by mouth daily.    Yes [provider]  ondansetron (ZOFRAN) 4 MG tablet Take 1 tablet (4 mg total) by mouth every 6 (six) hours. 01/07/19  Yes Wurst, [place], PA-C  oxyCODONE-acetaminophen (PERCOCET) 5-325 MG tablet Take 1-2 tablets by mouth every 6 (six) hours as needed for severe pain. 07/26/18  Yes Mesner, Corene Cornea, MD  pantoprazole (  PROTONIX) 40 MG tablet TAKE 1 TABLET BY MOUTH ONCE DAILY Patient taking differently: Take 40 mg by mouth daily.  03/06/18  Yes Annitta Needs, NP  zolpidem (AMBIEN) 10 MG tablet Take 10 mg by mouth at bedtime.    Yes [provider]  azithromycin (ZITHROMAX) 250 MG tablet Take 250-500 mg by mouth See admin instructions. Take 2 tablets (500mg  total) on day 1, then take 1 tablet (250mg  total) on days 2 through 5 03/06/19   [provider]  ibuprofen (ADVIL,MOTRIN) 600 MG tablet Take 1 tablet (600 mg total) by mouth every 6 (six) hours as needed. Patient not taking: Reported on 01/26/2019 07/15/17   Noemi Chapel, MD  Oxycodone HCl 10 MG TABS Take 0.5-1 tablets (5-10 mg total) by mouth every 6 (six) hours as needed for severe pain (Post operative pain). Patient not taking: Reported on 03/10/2019 01/25/19   Margarita Mail, PA-C  promethazine (PHENERGAN) 25 MG tablet Take 1 tablet (25 mg total) by mouth every  6 (six) hours as needed for nausea or vomiting. 07/20/17 01/07/19  Evalee Jefferson, PA-C    Family History Family History  Problem Relation Age of Onset   Cancer Mother        breast cancer - per medical history form dated 06/13/10.   Diverticulitis Mother    Hypertension Mother    Cancer Father        skin - per medical history form dated 06/13/10.   Hypertension Father    Anesthesia problems Neg Hx    Hypotension Neg Hx    Malignant hyperthermia Neg Hx    Pseudochol deficiency Neg Hx    Colon cancer Neg Hx     Social History Social History   Tobacco Use   Smoking status: Current Every Day Smoker    Packs/day: 0.50    Years: 18.00    Pack years: 9.00    Types: Cigarettes   Smokeless tobacco: Never Used   Tobacco comment:    Substance Use Topics   Alcohol use: No    Alcohol/week: 0.0 standard drinks   Drug use: No     Allergies   Other, Iohexol, Shrimp [shellfish allergy], Wheat bran, and Yeast-related products   Review of Systems Review of Systems  All other systems reviewed and are negative.    Physical Exam Updated Vital Signs BP 110/74 (BP Location: Right Arm)    Pulse 81    Temp 97.8 F (36.6 C) (Oral)    Resp 18    Ht 1.829 m (6')    Wt 105.2 kg    SpO2 96%    BMI 31.47 kg/m   Physical Exam Vitals signs and nursing note reviewed.  Constitutional:      General: He is in acute distress.     Appearance: He is well-developed. He is ill-appearing and diaphoretic.  HENT:     Head: Normocephalic and atraumatic.     Mouth/Throat:     Pharynx: No oropharyngeal exudate.  Eyes:     General: No scleral icterus.       Right eye: No discharge.        Left eye: No discharge.     Conjunctiva/sclera: Conjunctivae normal.     Pupils: Pupils are equal, round, and reactive to light.  Neck:     Musculoskeletal: Normal range of motion and neck supple.     Thyroid: No thyromegaly.     Vascular: No JVD.  Cardiovascular:     Rate and Rhythm: Regular  rhythm.  Tachycardia present.     Heart sounds: Normal heart sounds. No murmur. No friction rub. No gallop.   Pulmonary:     Effort: Respiratory distress present.     Breath sounds: Rales present. No wheezing.     Comments: Rales at the bases of the lungs bilaterally, the patient is tachypneic and speaks in shortened sentences Abdominal:     General: Bowel sounds are normal. There is no distension.     Palpations: Abdomen is soft. There is no mass.     Tenderness: There is no abdominal tenderness.  Musculoskeletal: Normal range of motion.        General: No tenderness.  Lymphadenopathy:     Cervical: No cervical adenopathy.  Skin:    General: Skin is warm.     Findings: No erythema or rash.  Neurological:     Mental Status: He is alert.     Coordination: Coordination normal.  Psychiatric:        Behavior: Behavior normal.      ED Treatments / Results  Labs (all labs ordered are listed, but only abnormal results are displayed) Labs Reviewed  LACTIC ACID, PLASMA - Abnormal; Notable for the following components:      Result Value   Lactic Acid, Venous 2.0 (*)    All other components within normal limits  CBC WITH DIFFERENTIAL/PLATELET - Abnormal; Notable for the following components:   WBC 11.4 (*)    Hemoglobin 17.2 (*)    Neutro Abs 9.4 (*)    Abs Immature Granulocytes 0.19 (*)    All other components within normal limits  COMPREHENSIVE METABOLIC PANEL - Abnormal; Notable for the following components:   Sodium 134 (*)    Potassium 3.2 (*)    CO2 19 (*)    Glucose, Bld 139 (*)    BUN 21 (*)    Creatinine, Ser 1.29 (*)    AST 44 (*)    ALT 69 (*)    All other components within normal limits  LACTATE DEHYDROGENASE - Abnormal; Notable for the following components:   LDH 198 (*)    All other components within normal limits  FERRITIN - Abnormal; Notable for the following components:   Ferritin 1,200 (*)    All other components within normal limits  TRIGLYCERIDES - Abnormal;  Notable for the following components:   Triglycerides 252 (*)    All other components within normal limits  FIBRINOGEN - Abnormal; Notable for the following components:   Fibrinogen 573 (*)    All other components within normal limits  C-REACTIVE PROTEIN - Abnormal; Notable for the following components:   CRP 4.4 (*)    All other components within normal limits  LACTIC ACID, PLASMA - Abnormal; Notable for the following components:   Lactic Acid, Venous 2.5 (*)    All other components within normal limits  COMPREHENSIVE METABOLIC PANEL - Abnormal; Notable for the following components:   Potassium 3.4 (*)    CO2 20 (*)    Glucose, Bld 167 (*)    Calcium 8.7 (*)    ALT 55 (*)    All other components within normal limits  CBC WITH DIFFERENTIAL/PLATELET - Abnormal; Notable for the following components:   Neutro Abs 8.1 (*)    Abs Immature Granulocytes 0.23 (*)    All other components within normal limits  C-REACTIVE PROTEIN - Abnormal; Notable for the following components:   CRP 5.1 (*)    All other components within normal limits  CULTURE, BLOOD (ROUTINE X 2)  CULTURE, BLOOD (ROUTINE X 2)  GASTROINTESTINAL PANEL BY PCR, STOOL (REPLACES STOOL CULTURE)  LACTIC ACID, PLASMA  D-DIMER, QUANTITATIVE (NOT AT [facility])  PROCALCITONIN  PROCALCITONIN  MAGNESIUM  PHOSPHORUS  D-DIMER, QUANTITATIVE (NOT AT [facility])  LACTOFERRIN, FECAL,QUALITATIVE    EKG EKG Interpretation  Date/Time:  Monday March 10 2019 20:20:21 EST Ventricular Rate:  95 PR Interval:    QRS Duration: 84 QT Interval:  326 QTC Calculation: 410 R Axis:   -14 Text Interpretation: Sinus rhythm Abnormal R-wave progression, early transition Baseline wander in lead(s) I II aVR ECG OTHERWISE WITHIN NORMAL LIMITS Confirmed by Noemi Chapel [phone]) on 03/10/2019 9:14:08 PM   Radiology Dg Chest Port 1 View  Result Date: 03/10/2019 CLINICAL DATA:  Shortness of breath and chest pain for 1 hour. Positive COVID-19 test 6 days  ago. EXAM: PORTABLE CHEST 1 VIEW COMPARISON:  03/03/2019 FINDINGS: Low lung volumes are present, causing crowding of the pulmonary vasculature. Hazy perihilar densities probably reflection of the vascular crowding. Linear subsegmental atelectasis at the left lung base. Otherwise no overt airspace opacity is identified. Cardiac and mediastinal margins appear normal. No blunting of the costophrenic angles. IMPRESSION: 1. Low lung volumes are present, causing crowding of the pulmonary vasculature. 2. Minimal subsegmental atelectasis at the left lung base. Electronically Signed   By: Van Clines M.D.   On: 03/10/2019 21:10    Procedures Procedures (including critical care time)  Medications Ordered in ED Medications  albuterol (VENTOLIN HFA) 108 (90 Base) MCG/ACT inhaler 2 puff (2 puffs Inhalation Given 03/11/19 1533)  levothyroxine (SYNTHROID) tablet 300 mcg (300 mcg Oral Given 03/11/19 1127)  meclizine (ANTIVERT) tablet 25 mg (25 mg Oral Given 03/11/19 0915)  pantoprazole (PROTONIX) EC tablet 40 mg (40 mg Oral Given 03/11/19 0910)  benzonatate (TESSALON) capsule 100 mg (100 mg Oral Given 03/11/19 0910)  loratadine (CLARITIN) tablet 10 mg (10 mg Oral Given 03/11/19 0910)  fluticasone (FLONASE) 50 MCG/ACT nasal spray 2 spray (has no administration in time range)  0.9 % NaCl with KCl 40 mEq / L  infusion (125 mL/hr Intravenous New Bag/Given 03/11/19 0918)  ondansetron (ZOFRAN) tablet 4 mg ( Oral See Alternative 03/11/19 1129)    Or  ondansetron (ZOFRAN) injection 4 mg (4 mg Intravenous Given 03/11/19 1129)  acetaminophen (TYLENOL) tablet 650 mg (has no administration in time range)  HYDROcodone-acetaminophen (NORCO/VICODIN) 5-325 MG per tablet 1-2 tablet (1 tablet Oral Given 03/11/19 1126)  morphine 2 MG/ML injection 4 mg (has no administration in time range)  dexamethasone (DECADRON) injection 6 mg (6 mg Intravenous Given 03/11/19 1548)  ondansetron (ZOFRAN) injection 4 mg (4 mg Intravenous Given  03/10/19 2058)  aerochamber Z-Stat Plus/medium 1 each (1 each Other Given 03/10/19 2150)  methylPREDNISolone (MEDROL) tablet 4 mg (4 mg Oral Given 03/11/19 0915)  potassium chloride SA (KLOR-CON) CR tablet 40 mEq (40 mEq Oral Given 03/11/19 1126)  potassium chloride SA (KLOR-CON) CR tablet 10 mEq (10 mEq Oral Given 03/11/19 1530)     Initial Impression / Assessment and Plan / ED Course  I have reviewed the triage vital signs and the nursing notes.  Pertinent labs & imaging results that were available during my care of the patient were reviewed by me and considered in my medical decision making (see chart for details).  Clinical Course as of Mar 10 1550  Mon Mar 10, 2019  2113 Rales present at the bases bilaterally consistent with pneumonia.  X-ray does show some atelectasis and some  slight elevation of the left hemidiaphragm.   [BM]    Clinical Course User Index [BM] Noemi Chapel, MD       This patient has signs and symptoms consistent with progressive pneumonia from coronavirus.  His oxygen at this time is 96% on room air but he is tachypneic to 30 breaths/min, tachycardic to 105, oxygen is currently okay however with his persistent vomiting shortness of breath in the presence of rales I suspect he will need to be admitted and transferred to [facility] for ongoing care of coronavirus infection.  The patient is agreeable with the plan, IV fluids, Zofran due to dehydration, supplemental oxygen as needed  D/w Chaterjee who will coordinate admissoin and trasnfer to [facility] hospital  Final Clinical Impressions(s) / ED Diagnoses   Final diagnoses:  Pneumonia due to COVID-19 virus  Intractable vomiting with nausea, unspecified vomiting type    ED Discharge Orders    None       Noemi Chapel, MD 03/11/19 1552

## 2019-03-10 NOTE — H&P (Signed)
History and Physical:    Andrew Love   [id number] DOB: May 11, 1965 DOA: 03/10/2019  Referring MD/provider: Dr. Sabra Heck PCP: Redmond School, MD   Patient coming from: Home  Chief Complaint: "I got Covid and I am sick".  History of Present Illness:   Andrew Love is an 53 y.o. male with past medical history significant for hypertension, CAD, diverticulitis and colitis who presents with concerns regarding his Covid infection.  This is his third visit to the ER with complaints of low oxygen and difficulty breathing.  Apparently his oximeter at home shows O2 sats in the mid 70s but evaluation in the ER has revealed O2 sats in the mid 90s with negative chest x-ray.  Patient notes his wife had been admitted to [facility] with Covid and is somewhat frustrated that he is not getting the help that he feels that he needs.  Today patient presents complaining of vomiting x3 days, decreased p.o. intake, dizziness, chest discomfort, diaphoresis, shortness of breath and palpitations.  Patient notes that he has only been able to eat grilled chicken and that he sometimes vomits that up as well.  He notes that his chest hurts from vomiting.  He notes he feels dizzy every time he gets up. Patient denies dizziness when he is sitting in bed. He also feels that he has to vomit every time he gets up as well.  Patient is frustrated that his pulse oximeter at home is so low but "I am always in the 90s when I come here".  ED Course:  The patient was noted to be somewhat tachycardic and was noted have rales on exam although he has a negative chest x-ray and no oxygen requirement.  He was noted to be nauseated and was treated with Zofran.  He was noted to be slightly hypokalemic as well with mild renal insufficiency.  He was treated with normal saline at 125 cc an hour.  Patient was treated empirically with azithromycin for the rales on chest x-ray.  ROS:   ROS   Review of Systems: As per HPI.  Past  Medical History:   Past Medical History:  Diagnosis Date   Arthritis    Back pain    chronic   Bronchitis    history of   CAD (coronary artery disease)    Chronic back pain    Chronic insomnia    Per medical history form dated 06/13/10.   Chronic left hip pain    Colitis    Per medical history from dated 06/13/10.   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   Sigmoid colon ulcer    Rectal polyps   Sleep apnea with use of continuous positive airway pressure (CPAP)     Past Surgical History:   Past Surgical History:  Procedure Laterality Date   APPENDECTOMY     8th grade   BACK SURGERY     CARDIAC CATHETERIZATION  2009   CARDIAC CATHETERIZATION     Per medical history from dated 06/13/10.   CERVICAL SPINE SURGERY     C4, C5, C6 spinal fusion   CHOLECYSTECTOMY N/A 08/31/2015   Procedure: LAPAROSCOPIC CHOLECYSTECTOMY WITH INTRAOPERATIVE CHOLANGIOGRAM;  Surgeon: Excell Seltzer, MD;  Location: [facility];  Service: General;  Laterality: N/A;   COLONOSCOPY  07/2008   Colitis,NSAID v. Ischemia,malrotation of the gut,Diverticulosis(L),hyperplastic   COLONOSCOPY N/A 12/13/2012   Dr. Oneida Alar: Normal TI, mild sigmoid diverticulosis, hemorrhoids, 2 polyps (tubular adenoma). Random colon bx negative. Next TCS 12/2022 with Fentanyl/phenergan   ESOPHAGOGASTRODUODENOSCOPY N/A 04/16/2014   RMR: Erosive reflux esophagitis. Non critical Schzki's ring not manipulated. Small hiatal hernia. Abnormal gastirc mucosa of doubtful signigicance status post biopsy. I suspect trivial upper GI bleed. Recent abdominal pain presumably secondary to a protracted bout of diverticulitis. CT scan November 23 revealed improvemetn without complication. He finished his antibiotics 2 days  age. Left sided ab   JOINT REPLACEMENT     LAPAROSCOPIC SIGMOID COLECTOMY  2012   Dr. Fanny Skates: recurrent sigmoid diverticulitis   LAPAROSCOPIC SIGMOID COLECTOMY  2012   recurernt sigmoid diverticulitis, Dr. Irish Elders CUFF REPAIR     Left - per medical history form dated 06/13/10.   SHOULDER SURGERY     Left   THYROIDECTOMY     Per medical history form dated 06/13/10.   TOTAL HIP ARTHROPLASTY  07/04/2011   Procedure: TOTAL HIP ARTHROPLASTY;  Surgeon: Johnny Bridge, MD;  Location: [place];  Service: Orthopedics;  Laterality: Right;    Social History:   Social History   Socioeconomic History   Marital status: Married    Spouse name: Not on file   Number of children: Not on file   Years of education: Not on file   Highest education level: Not on file  Occupational History   Occupation: [employer] resource strain: Not on file   Food insecurity    Worry: Not on file    Inability: Not on file   Transportation needs    Medical: Not on file    Non-medical: Not on file  Tobacco Use   Smoking status: Current Every Day Smoker    Packs/day: 0.50    Years: 18.00    Pack years: 9.00    Types: Cigarettes   Smokeless tobacco: Never Used   Tobacco comment:    Substance and Sexual Activity   Alcohol use: No    Alcohol/week: 0.0 standard drinks   Drug use: No   Sexual activity: Not on file  Lifestyle   Physical activity    Days per week: Not on file    Minutes per session: Not on file   Stress: Not on file  Relationships   Social connections    Talks on phone: Not on file    Gets together: Not on file    Attends religious service: Not on file    Active member of club or organization: Not on file    Attends meetings of clubs or organizations: Not on file    Relationship status: Not on file   Intimate partner violence    Fear of current or ex partner: Not on file    Emotionally abused: Not on file     Physically abused: Not on file    Forced sexual activity: Not on file  Other Topics Concern   Not on file  Social History Narrative   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   Married    Allergies   Other, Iohexol, Shrimp [shellfish allergy], Wheat bran, and Yeast-related products  Family history:   Family History  Problem Relation Age of Onset   Cancer Mother        breast cancer - per  medical history form dated 06/13/10.   Diverticulitis Mother    Hypertension Mother    Cancer Father        skin - per medical history form dated 06/13/10.   Hypertension Father    Anesthesia problems Neg Hx    Hypotension Neg Hx    Malignant hyperthermia Neg Hx    Pseudochol deficiency Neg Hx    Colon cancer Neg Hx     Current Medications:   Prior to Admission medications   Medication Sig Start Date End Date Taking? Authorizing Provider  acetaminophen (TYLENOL) 500 MG tablet Take 500-1,000 mg by mouth every 6 (six) hours as needed for mild pain or fever.   Yes [provider]  benzonatate (TESSALON) 100 MG capsule Take 1 capsule (100 mg total) by mouth every 8 (eight) hours. Patient taking differently: Take 100 mg by mouth every 8 (eight) hours as needed for cough.  02/26/19  Yes Wurst, [place], PA-C  cetirizine (ZYRTEC) 10 MG chewable tablet Chew 1 tablet (10 mg total) by mouth daily. 02/26/19  Yes Wurst, [place], PA-C  EPINEPHrine (EPIPEN) 0.3 mg/0.3 mL SOAJ Inject 0.3 mLs (0.3 mg total) into the muscle as needed. Patient taking differently: Inject 0.3 mg into the muscle daily as needed (allergic reaction).  12/01/12  Yes Teressa Lower, MD  fluticasone [facility]) 50 MCG/ACT nasal spray Place 2 sprays into both nostrils daily. Patient taking differently: Place 2 sprays into both nostrils daily as needed for allergies.  02/26/19  Yes Wurst, [place], PA-C  hydrocortisone cream 1 % Apply 1 application topically once as needed for itching.   Yes [provider]   ibuprofen (ADVIL) 200 MG tablet Take 800 mg by mouth every 6 (six) hours as needed for moderate pain.   Yes [provider]  levothyroxine (SYNTHROID, LEVOTHROID) 300 MCG tablet Take 300 mcg by mouth every morning.  03/26/14  Yes [provider]  meclizine (ANTIVERT) 25 MG tablet Take 1 tablet (25 mg total) by mouth 3 (three) times daily as needed for dizziness. 04/09/17  Yes Isla Pence, MD  methylPREDNISolone (MEDROL DOSEPAK) 4 MG TBPK tablet Take 4-24 mg by mouth See admin instructions. Take as directed per package instructions starting on 03/06/2019 for 6 days (6,5,4,3,2,1) 03/06/19  Yes [provider]  olmesartan (BENICAR) 20 MG tablet Take 20 mg by mouth daily.    Yes [provider]  ondansetron (ZOFRAN) 4 MG tablet Take 1 tablet (4 mg total) by mouth every 6 (six) hours. 01/07/19  Yes Wurst, [place], PA-C  oxyCODONE-acetaminophen (PERCOCET) 5-325 MG tablet Take 1-2 tablets by mouth every 6 (six) hours as needed for severe pain. 07/26/18  Yes Mesner, Corene Cornea, MD  pantoprazole (PROTONIX) 40 MG tablet TAKE 1 TABLET BY MOUTH ONCE DAILY Patient taking differently: Take 40 mg by mouth daily.  03/06/18  Yes Annitta Needs, NP  zolpidem (AMBIEN) 10 MG tablet Take 10 mg by mouth at bedtime.    Yes [provider]  azithromycin (ZITHROMAX) 250 MG tablet Take 250-500 mg by mouth See admin instructions. Take 2 tablets (500mg  total) on day 1, then take 1 tablet (250mg  total) on days 2 through 5 03/06/19   [provider]  ibuprofen (ADVIL,MOTRIN) 600 MG tablet Take 1 tablet (600 mg total) by mouth every 6 (six) hours as needed. Patient not taking: Reported on 01/26/2019 07/15/17   Noemi Chapel, MD  Oxycodone HCl 10 MG TABS Take 0.5-1 tablets (5-10 mg total) by mouth every 6 (six) hours as  needed for severe pain (Post operative pain). Patient not taking: Reported on 03/10/2019 01/25/19   Margarita Mail, PA-C  promethazine (PHENERGAN) 25 MG tablet Take 1  tablet (25 mg total) by mouth every 6 (six) hours as needed for nausea or vomiting. 07/20/17 01/07/19  Evalee Jefferson, PA-C    Physical Exam:   Vitals:   03/10/19 2038 03/10/19 2100 03/10/19 2151 03/10/19 2200  BP:  103/83 108/79 110/72  Pulse:  99 94 (!) 107  Resp:  (!) 27 (!) 24 20  SpO2:  95% 95% 95%  Weight: 105.2 kg     Height: 6' (1.829 m)        Physical Exam: Blood pressure 110/72, pulse (!) 107, resp. rate 20, height 6' (1.829 m), weight 105.2 kg, SpO2 95 %. Gen: Anxious appearing man sitting up in stretcher in no acute respiratory distress. Eyes: Sclerae anicteric. Conjunctiva mildly injected. Chest: Moderately good air entry bilaterally with moist rales at bases which did not clear with deep breathing.  CV: Distant, regular, no audible murmurs. Abdomen: NABS, soft, nondistended, nontender. No tenderness to light or deep palpation. No rebound, no guarding. Extremities: No edema.  Skin: Warm and dry. No rashes, lesions or wounds. Neuro: Alert and oriented times 3; grossly nonfocal. Psych: Patient is cooperative, logical and coherent with very anxious mood and affect.  Data Review:    Labs: Basic Metabolic Panel: Recent Labs  Lab 03/10/19 2102  NA 134*  K 3.2*  CL 101  CO2 19*  GLUCOSE 139*  BUN 21*  CREATININE 1.29*  CALCIUM 8.9   Liver Function Tests: Recent Labs  Lab 03/10/19 2102  AST 44*  ALT 69*  ALKPHOS 74  BILITOT 0.8  PROT 7.9  ALBUMIN 4.4   No results for input(s): LIPASE, AMYLASE in the last 168 hours. No results for input(s): AMMONIA in the last 168 hours. CBC: Recent Labs  Lab 03/10/19 2102  WBC 11.4*  NEUTROABS 9.4*  HGB 17.2*  HCT 49.8  MCV 92.2  PLT 209   Cardiac Enzymes: No results for input(s): CKTOTAL, CKMB, CKMBINDEX, TROPONINI in the last 168 hours.  BNP (last 3 results) No results for input(s): PROBNP in the last 8760 hours. CBG: No results for input(s): GLUCAP in the last 168 hours.  Urinalysis    Component Value  Date/Time   COLORURINE YELLOW 03/03/2019 1714   APPEARANCEUR CLEAR 03/03/2019 1714   LABSPEC 1.009 03/03/2019 1714   PHURINE 7.0 03/03/2019 1714   GLUCOSEU NEGATIVE 03/03/2019 1714   HGBUR NEGATIVE 03/03/2019 1714   BILIRUBINUR NEGATIVE 03/03/2019 [place] 03/03/2019 1714   PROTEINUR NEGATIVE 03/03/2019 1714   UROBILINOGEN 0.2 03/31/2014 0150   NITRITE NEGATIVE 03/03/2019 1714   LEUKOCYTESUR NEGATIVE 03/03/2019 1714      Radiographic Studies: Dg Chest Port 1 View  Result Date: 03/10/2019 CLINICAL DATA:  Shortness of breath and chest pain for 1 hour. Positive COVID-19 test 6 days ago. EXAM: PORTABLE CHEST 1 VIEW COMPARISON:  03/03/2019 FINDINGS: Low lung volumes are present, causing crowding of the pulmonary vasculature. Hazy perihilar densities probably reflection of the vascular crowding. Linear subsegmental atelectasis at the left lung base. Otherwise no overt airspace opacity is identified. Cardiac and mediastinal margins appear normal. No blunting of the costophrenic angles. IMPRESSION: 1. Low lung volumes are present, causing crowding of the pulmonary vasculature. 2. Minimal subsegmental atelectasis at the left lung base. Electronically Signed   By: Van Clines M.D.   On: 03/10/2019 21:10    EKG:  Independently reviewed.  Sinus rhythm at 100.  Axis -15.  Very poor baseline.  Poor R wave progression.  No acute ST-T wave changes.   Assessment/Plan:   Principal Problem:   Vomiting Active Problems:   Essential hypertension   Coronary artery disease involving native coronary artery of native heart without angina pectoris   COVID-19 virus infection   Hypokalemia   AKI (acute kidney injury) (HCC)   Metabolic acidosis  53 year old man presents for the third time with concerns about Covid infection now with vomiting x3 days and laboratory data with new onset mild metabolic acidosis with anion gap of 14 and very mild acute kidney injury with creatinine of 1.3  which is up from 1 last week.  Inflammatory markers are otherwise unrevealing with essentially normal LDH, D-dimer, fibrinogen, lactic acid and negative procalcitonin. Ferritin is still pending.  Of note, patient is also very anxious and feels that he is not getting the help that he would like.   VOMITING WITH ELECTROLYTE ABNORMALITIES Will admit patient for observation overnight and gentle hydration with supplementation of his potassium. Manage vomiting with bowel rest and with Zofran and see if he can tolerate p.o.'s in the morning. Repeat potassium, creatinine and bicarb in the morning.  RALES ON EXAM Patient does have moist rales at bases that did not resolve with deep breathing. LDH is essentially normal and chest x-ray is negative. However patient is day 10 after exposure, can observe overnight and reassess clinically in the morning as warranted. Of note patient is on steroid taper which had previously been ordered, I will continue this.  HTN Will hold olmesartan overnight, this can be started in the morning if blood pressure rebounds with hydration.  HYPOTHYROIDISM Continue Synthroid   Other information:   DVT prophylaxis: Lovenox ordered. Code Status: Full code. Family Communication: States wife knows he is in the ER Disposition Plan: Home Consults called: None Admission status: Observation  The medical decision making on this patient was of high complexity and the patient is at high risk for clinical deterioration, therefore this is a level 3 visit.  The medical decision making is of moderate complexity, therefore this is a level 2 visit.  Dewaine Oats Tublu Yanice Maqueda [facility]  If 7PM-7AM, please contact night-coverage [email or web address] Password [email or web address] 03/10/2019, 10:30 PM

## 2019-03-10 NOTE — ED Notes (Signed)
Pt Dx with COVID Tuesday 10/27. Pt C/O Increased SOB and chest pain that began 1 hour prior to arrival. Pt states he had a temperature at home and stated that he has taken 1000mg  tylenol.

## 2019-03-11 ENCOUNTER — Inpatient Hospital Stay (HOSPITAL_COMMUNITY): Payer: BC Managed Care – PPO

## 2019-03-11 DIAGNOSIS — A0839 Other viral enteritis: Secondary | ICD-10-CM | POA: Diagnosis present

## 2019-03-11 DIAGNOSIS — Z9049 Acquired absence of other specified parts of digestive tract: Secondary | ICD-10-CM | POA: Diagnosis not present

## 2019-03-11 DIAGNOSIS — G4733 Obstructive sleep apnea (adult) (pediatric): Secondary | ICD-10-CM | POA: Diagnosis present

## 2019-03-11 DIAGNOSIS — M25552 Pain in left hip: Secondary | ICD-10-CM | POA: Diagnosis present

## 2019-03-11 DIAGNOSIS — I2510 Atherosclerotic heart disease of native coronary artery without angina pectoris: Secondary | ICD-10-CM | POA: Diagnosis present

## 2019-03-11 DIAGNOSIS — Z8719 Personal history of other diseases of the digestive system: Secondary | ICD-10-CM | POA: Diagnosis not present

## 2019-03-11 DIAGNOSIS — E872 Acidosis: Secondary | ICD-10-CM

## 2019-03-11 DIAGNOSIS — F5104 Psychophysiologic insomnia: Secondary | ICD-10-CM | POA: Diagnosis present

## 2019-03-11 DIAGNOSIS — Z96641 Presence of right artificial hip joint: Secondary | ICD-10-CM | POA: Diagnosis present

## 2019-03-11 DIAGNOSIS — I10 Essential (primary) hypertension: Secondary | ICD-10-CM | POA: Diagnosis present

## 2019-03-11 DIAGNOSIS — M549 Dorsalgia, unspecified: Secondary | ICD-10-CM | POA: Diagnosis present

## 2019-03-11 DIAGNOSIS — G8929 Other chronic pain: Secondary | ICD-10-CM | POA: Diagnosis present

## 2019-03-11 DIAGNOSIS — R112 Nausea with vomiting, unspecified: Secondary | ICD-10-CM | POA: Diagnosis not present

## 2019-03-11 DIAGNOSIS — E039 Hypothyroidism, unspecified: Secondary | ICD-10-CM | POA: Diagnosis present

## 2019-03-11 DIAGNOSIS — E876 Hypokalemia: Secondary | ICD-10-CM

## 2019-03-11 DIAGNOSIS — Z808 Family history of malignant neoplasm of other organs or systems: Secondary | ICD-10-CM | POA: Diagnosis not present

## 2019-03-11 DIAGNOSIS — Z8249 Family history of ischemic heart disease and other diseases of the circulatory system: Secondary | ICD-10-CM | POA: Diagnosis not present

## 2019-03-11 DIAGNOSIS — Z803 Family history of malignant neoplasm of breast: Secondary | ICD-10-CM | POA: Diagnosis not present

## 2019-03-11 DIAGNOSIS — U071 COVID-19: Principal | ICD-10-CM

## 2019-03-11 DIAGNOSIS — N179 Acute kidney failure, unspecified: Secondary | ICD-10-CM | POA: Diagnosis present

## 2019-03-11 DIAGNOSIS — J1289 Other viral pneumonia: Secondary | ICD-10-CM | POA: Diagnosis present

## 2019-03-11 DIAGNOSIS — Z888 Allergy status to other drugs, medicaments and biological substances status: Secondary | ICD-10-CM | POA: Diagnosis not present

## 2019-03-11 DIAGNOSIS — Z981 Arthrodesis status: Secondary | ICD-10-CM | POA: Diagnosis not present

## 2019-03-11 DIAGNOSIS — Z91013 Allergy to seafood: Secondary | ICD-10-CM | POA: Diagnosis not present

## 2019-03-11 DIAGNOSIS — Z91018 Allergy to other foods: Secondary | ICD-10-CM | POA: Diagnosis not present

## 2019-03-11 DIAGNOSIS — F17210 Nicotine dependence, cigarettes, uncomplicated: Secondary | ICD-10-CM | POA: Diagnosis present

## 2019-03-11 LAB — COMPREHENSIVE METABOLIC PANEL
ALT: 55 U/L — ABNORMAL HIGH (ref 0–44)
AST: 34 U/L (ref 15–41)
Albumin: 4 g/dL (ref 3.5–5.0)
Alkaline Phosphatase: 69 U/L (ref 38–126)
Anion gap: 10 (ref 5–15)
BUN: 20 mg/dL (ref 6–20)
CO2: 20 mmol/L — ABNORMAL LOW (ref 22–32)
Calcium: 8.7 mg/dL — ABNORMAL LOW (ref 8.9–10.3)
Chloride: 106 mmol/L (ref 98–111)
Creatinine, Ser: 1.09 mg/dL (ref 0.61–1.24)
GFR calc Af Amer: >60 mL/min (ref >60)
GFR calc non Af Amer: >60 mL/min (ref >60)
Glucose, Bld: 167 mg/dL — ABNORMAL HIGH (ref 70–99)
Potassium: 3.4 mmol/L — ABNORMAL LOW (ref 3.5–5.1)
Sodium: 136 mmol/L (ref 135–145)
Total Bilirubin: 0.9 mg/dL (ref 0.3–1.2)
Total Protein: 7.1 g/dL (ref 6.5–8.1)

## 2019-03-11 LAB — CBC WITH DIFFERENTIAL/PLATELET
Abs Immature Granulocytes: 0.23 10*3/uL — ABNORMAL HIGH (ref 0.00–0.07)
Basophils Absolute: 0.1 10*3/uL (ref 0.0–0.1)
Basophils Relative: 1 %
Eosinophils Absolute: 0 10*3/uL (ref 0.0–0.5)
Eosinophils Relative: 0 %
HCT: 46.3 % (ref 39.0–52.0)
Hemoglobin: 16 g/dL (ref 13.0–17.0)
Immature Granulocytes: 2 %
Lymphocytes Relative: 13 %
Lymphs Abs: 1.3 10*3/uL (ref 0.7–4.0)
MCH: 32.3 pg (ref 26.0–34.0)
MCHC: 34.6 g/dL (ref 30.0–36.0)
MCV: 93.3 fL (ref 80.0–100.0)
Monocytes Absolute: 0.5 10*3/uL (ref 0.1–1.0)
Monocytes Relative: 5 %
Neutro Abs: 8.1 10*3/uL — ABNORMAL HIGH (ref 1.7–7.7)
Neutrophils Relative %: 79 %
Platelets: 212 10*3/uL (ref 150–400)
RBC: 4.96 MIL/uL (ref 4.22–5.81)
RDW: 13.2 % (ref 11.5–15.5)
WBC: 10.1 10*3/uL (ref 4.0–10.5)
nRBC: 0 % (ref 0.0–0.2)

## 2019-03-11 LAB — MAGNESIUM: Magnesium: 2.2 mg/dL (ref 1.7–2.4)

## 2019-03-11 LAB — PROCALCITONIN: Procalcitonin: 0.14 ng/mL

## 2019-03-11 LAB — LACTATE DEHYDROGENASE: LDH: 181 U/L (ref 98–192)

## 2019-03-11 LAB — LACTIC ACID, PLASMA
Lactic Acid, Venous: 1.4 mmol/L (ref 0.5–1.9)
Lactic Acid, Venous: 2.5 mmol/L (ref 0.5–1.9)

## 2019-03-11 LAB — D-DIMER, QUANTITATIVE: D-Dimer, Quant: 0.34 ug/mL-FEU (ref 0.00–0.50)

## 2019-03-11 LAB — PHOSPHORUS: Phosphorus: 3.3 mg/dL (ref 2.5–4.6)

## 2019-03-11 LAB — C-REACTIVE PROTEIN: CRP: 5.1 mg/dL — ABNORMAL HIGH (ref <1.0)

## 2019-03-11 MED ORDER — ACETAMINOPHEN 500 MG PO TABS: 500.0000 mg | ORAL_TABLET | Freq: Four times a day (QID) | ORAL | Status: DC | PRN

## 2019-03-11 MED ORDER — ONDANSETRON HCL 4 MG PO TABS: 4.0000 mg | ORAL_TABLET | Freq: Four times a day (QID) | ORAL | Status: DC | PRN

## 2019-03-11 MED ORDER — POTASSIUM CHLORIDE CRYS ER 20 MEQ PO TBCR
40.0000 meq | EXTENDED_RELEASE_TABLET | Freq: Once | ORAL | Status: AC
  Administered 2019-03-11: 40 meq via ORAL
  Filled 2019-03-11: qty 2

## 2019-03-11 MED ORDER — FLUTICASONE PROPIONATE 50 MCG/ACT NA SUSP
2.0000 | Freq: Every day | NASAL | Status: DC | PRN
  Filled 2019-03-11: qty 16

## 2019-03-11 MED ORDER — METHYLPREDNISOLONE 4 MG PO TABS: 4.0000 mg | ORAL_TABLET | Freq: Once | ORAL | Status: DC

## 2019-03-11 MED ORDER — POTASSIUM CHLORIDE IN NACL 40-0.9 MEQ/L-% IV SOLN
INTRAVENOUS | Status: DC
  Administered 2019-03-11 – 2019-03-12 (×4): 125 mL/h via INTRAVENOUS
  Filled 2019-03-11 (×5): qty 1000

## 2019-03-11 MED ORDER — ACETAMINOPHEN 325 MG PO TABS: 650.0000 mg | ORAL_TABLET | Freq: Four times a day (QID) | ORAL | Status: DC | PRN

## 2019-03-11 MED ORDER — SODIUM CHLORIDE 0.9 % IV SOLN
200.0000 mg | Freq: Once | INTRAVENOUS | Status: AC
  Administered 2019-03-11: 200 mg via INTRAVENOUS
  Filled 2019-03-11: qty 40

## 2019-03-11 MED ORDER — POTASSIUM CHLORIDE 10 MEQ/100ML IV SOLN
10.0000 meq | INTRAVENOUS | Status: DC
  Administered 2019-03-11: 10 meq via INTRAVENOUS
  Filled 2019-03-11: qty 100

## 2019-03-11 MED ORDER — DEXAMETHASONE SODIUM PHOSPHATE 10 MG/ML IJ SOLN
6.0000 mg | INTRAMUSCULAR | Status: DC
  Administered 2019-03-11 – 2019-03-13 (×3): 6 mg via INTRAVENOUS
  Filled 2019-03-11 (×3): qty 1

## 2019-03-11 MED ORDER — ONDANSETRON HCL 4 MG/2ML IJ SOLN
4.0000 mg | Freq: Four times a day (QID) | INTRAMUSCULAR | Status: DC | PRN
  Administered 2019-03-11 (×2): 4 mg via INTRAVENOUS
  Filled 2019-03-11 (×2): qty 2

## 2019-03-11 MED ORDER — MECLIZINE HCL 25 MG PO TABS
25.0000 mg | ORAL_TABLET | Freq: Three times a day (TID) | ORAL | Status: DC | PRN
  Administered 2019-03-11: 25 mg via ORAL
  Filled 2019-03-11 (×2): qty 1

## 2019-03-11 MED ORDER — ENOXAPARIN SODIUM 40 MG/0.4ML ~~LOC~~ SOLN
40.0000 mg | SUBCUTANEOUS | Status: DC
  Administered 2019-03-11: 40 mg via SUBCUTANEOUS
  Filled 2019-03-11: qty 0.4

## 2019-03-11 MED ORDER — POTASSIUM CHLORIDE CRYS ER 20 MEQ PO TBCR
10.0000 meq | EXTENDED_RELEASE_TABLET | Freq: Once | ORAL | Status: AC
  Administered 2019-03-11: 10 meq via ORAL
  Filled 2019-03-11: qty 1

## 2019-03-11 MED ORDER — METHYLPREDNISOLONE 4 MG PO TABS
4.0000 mg | ORAL_TABLET | Freq: Once | ORAL | Status: AC
  Administered 2019-03-11: 4 mg via ORAL
  Filled 2019-03-11: qty 1

## 2019-03-11 MED ORDER — SODIUM CHLORIDE 0.9 % IV SOLN
100.0000 mg | INTRAVENOUS | Status: DC
  Administered 2019-03-12 – 2019-03-14 (×3): 100 mg via INTRAVENOUS
  Filled 2019-03-11 (×3): qty 20

## 2019-03-11 MED ORDER — ENOXAPARIN SODIUM 40 MG/0.4ML ~~LOC~~ SOLN
40.0000 mg | SUBCUTANEOUS | Status: DC
  Administered 2019-03-12 – 2019-03-14 (×3): 40 mg via SUBCUTANEOUS
  Filled 2019-03-11 (×3): qty 0.4

## 2019-03-11 MED ORDER — METHYLPREDNISOLONE 4 MG PO TBPK: 4.0000 mg | ORAL_TABLET | ORAL | Status: DC

## 2019-03-11 MED ORDER — LEVOTHYROXINE SODIUM 75 MCG PO TABS
300.0000 ug | ORAL_TABLET | Freq: Every morning | ORAL | Status: DC
  Administered 2019-03-11 – 2019-03-14 (×4): 300 ug via ORAL
  Filled 2019-03-11 (×4): qty 4

## 2019-03-11 MED ORDER — BENZONATATE 100 MG PO CAPS
100.0000 mg | ORAL_CAPSULE | Freq: Three times a day (TID) | ORAL | Status: DC | PRN
  Administered 2019-03-11: 100 mg via ORAL
  Filled 2019-03-11: qty 1

## 2019-03-11 MED ORDER — PANTOPRAZOLE SODIUM 40 MG PO TBEC
40.0000 mg | DELAYED_RELEASE_TABLET | Freq: Every day | ORAL | Status: DC
  Administered 2019-03-11 – 2019-03-14 (×4): 40 mg via ORAL
  Filled 2019-03-11 (×4): qty 1

## 2019-03-11 MED ORDER — LORATADINE 10 MG PO TABS
10.0000 mg | ORAL_TABLET | Freq: Every day | ORAL | Status: DC
  Administered 2019-03-11 – 2019-03-14 (×4): 10 mg via ORAL
  Filled 2019-03-11 (×4): qty 1

## 2019-03-11 MED ORDER — HYDROCODONE-ACETAMINOPHEN 5-325 MG PO TABS
1.0000 | ORAL_TABLET | ORAL | Status: DC | PRN
  Administered 2019-03-11 (×3): 1 via ORAL
  Administered 2019-03-12 – 2019-03-13 (×3): 2 via ORAL
  Filled 2019-03-11 (×2): qty 1
  Filled 2019-03-11 (×3): qty 2
  Filled 2019-03-11: qty 1

## 2019-03-11 MED ORDER — METHYLPREDNISOLONE 4 MG PO TABS
4.0000 mg | ORAL_TABLET | Freq: Once | ORAL | Status: DC
  Filled 2019-03-11: qty 1

## 2019-03-11 MED ORDER — MORPHINE SULFATE (PF) 2 MG/ML IV SOLN
4.0000 mg | INTRAVENOUS | Status: DC | PRN
  Filled 2019-03-11: qty 2

## 2019-03-11 NOTE — Progress Notes (Signed)
ANTICOAGULATION CONSULT NOTE - Initial Consult  Pharmacy Consult for Lovenox Indication: VTE prophylaxis  Allergies  Allergen Reactions  . Other Hives and Shortness Of Breath    Cockroaches  . Iohexol Hives  . Shrimp [Shellfish Allergy] Hives  . Wheat Bran Hives  . Yeast-Related Products Hives    Patient Measurements: Height: 6' (182.9 cm) Weight: 232 lb (105.2 kg) IBW/kg (Calculated) : 77.6   Estimated Creatinine Clearance: 99.3 mL/min (by C-G formula based on SCr of 1.09 mg/dL).   Assessment: PHARMACY CONSULT: Lovenox for VTE prophylaxis  Non-ICU status. Wt: 105 kg with BMI: 31 Scr:  1.09 with CrCl >30 ml/hr  CBC wnl Ddimer 0.34  Goal of Therapy:  Monitor platelets by anticoagulation protocol: Yes   Plan:  Lovenox 40 mg [place] daily - next dose due on 11/4. Pharmacy to sign off.   Gretta Arab PharmD, BCPS Clinical pharmacist phone 7am- 5pm: [phone] 03/11/2019 4:21 PM

## 2019-03-11 NOTE — Plan of Care (Signed)
  Problem: Education: Goal: Knowledge of risk factors and measures for prevention of condition will improve Outcome: Progressing   Problem: Coping: Goal: Psychosocial and spiritual needs will be supported Outcome: Progressing   Problem: Respiratory: Goal: Will maintain a patent airway Outcome: Progressing Goal: Complications related to the disease process, condition or treatment will be avoided or minimized Outcome: Progressing   

## 2019-03-11 NOTE — Progress Notes (Signed)
Remdesivir - Pharmacy Brief Note   O:  ALT: 55 CXR: Hazy perihilar densities probably reflection of the vascular crowding. Linear subsegmental atelectasis at the left lung base. Otherwise no overt airspace opacity is identified. SpO2: 95% on room air  Per MD, despite lack of respiratory symptoms, due to CRP 4.4 > 5.1 and GI symptoms, he would like to start Remdesivir today.   A/P:  Remdesivir 200 mg once followed by 100 mg daily x 4 days.   Gretta Arab PharmD, BCPS Clinical pharmacist phone 7am- 5pm: [phone] 03/11/2019 4:22 PM

## 2019-03-11 NOTE — Progress Notes (Addendum)
PROGRESS NOTE    Andrew Love  [id number] DOB: 1965-11-12 DOA: 03/10/2019 PCP: Redmond School, MD   Brief Narrative:  53 y.o. WM PMHx HTN, CAD, OSA on CPAP, diverticulitis, colitis, chronic hip pain, chronic insomnia  Presents with concerns regarding his Covid infection.  This is his third visit to the ER with complaints of low oxygen and difficulty breathing.  Apparently his oximeter at home shows O2 sats in the mid 70s but evaluation in the ER has revealed O2 sats in the mid 90s with negative chest x-ray.  Patient notes his wife had been admitted to [facility] with Covid and is somewhat frustrated that he is not getting the help that he feels that he needs.  Today patient presents complaining of vomiting x3 days, decreased p.o. intake, dizziness, chest discomfort, diaphoresis, shortness of breath and palpitations.  Patient notes that he has only been able to eat grilled chicken and that he sometimes vomits that up as well.  He notes that his chest hurts from vomiting.  He notes he feels dizzy every time he gets up. Patient denies dizziness when he is sitting in bed. He also feels that he has to vomit every time he gets up as well.  Patient is frustrated that his pulse oximeter at home is so low but "I am always in the 90s when I come here".  ED Course:  The patient was noted to be somewhat tachycardic and was noted have rales on exam although he has a negative chest x-ray and no oxygen requirement.  He was noted to be nauseated and was treated with Zofran.  He was noted to be slightly hypokalemic as well with mild renal insufficiency.  He was treated with normal saline at 125 cc an hour.  Patient was treated empirically with azithromycin for the rales on chest x-ray.   Subjective: 11/3 10 watery stools since 2 AM, states having left-sided LUQ/LLQ abdominal pain similar to when he had previous episode of colitis and required surgery.  Appears he was last seen 2018 by NP Roseanne Kaufman per  her note last colonoscopy 2014 with tubular adenomas, significant history diverticulitis s/p sigmoid colectomy 2012.  Appears they wanted to perform additional colonoscopy in near future but I Do Not See Where Patient appointment   Assessment & Plan:   Principal Problem:   Vomiting Active Problems:   Essential hypertension   Coronary artery disease involving native coronary artery of native heart without angina pectoris   COVID-19 virus infection   Hypokalemia   AKI (acute kidney injury) ([place])   Metabolic acidosis   Diarrhea due to COVID-19  Covid infection/diarrhea watery -Patient appears to have GI form of Covid. -Decadron 6 mg daily -Remdesivir per pharmacy; discontinued steroid taper -Have consulted [facility].  Spoke with Dr Arta Silence recommended CT abdomen without contrast, agreed with other orders I had already placed.      COVID-19 Labs  Recent Labs    03/10/19 2102 03/10/19 2103 03/11/19 0847  DDIMER <0.27  --  0.34  FERRITIN  --  1,200*  --   LDH 198*  --   --   CRP  --  4.4* 5.1*    Lab Results  Component Value Date   SARSCOV2NAA POSITIVE (A) 03/03/2019   [place] Not Detected 02/26/2019   [place] NEGATIVE 01/26/2019   [place] Not Detected 01/07/2019   Nausea/vomiting -N.p.o. except sucking on ice chips -Correct electrolyte abnormalities  Essential HTN -Currently controlled without medication  Hypothyroidism  -Levothyroxine  300 mcg daily  Diarrhea -Stool lactoferrin pending -GI stool panel pending   DVT prophylaxis: Lovenox per pharmacy Covid protocol Code Status: Full Family Communication: 11/3 left message on phone that I had attempted to contact Lattie Haw (wife) to inform her of her husband's treatment plan. Disposition Plan: TBD   Consultants:  Phone consult [facility] Dr. Arta Silence     Procedures/Significant Events:  11/2 PCXR; minimal subsegmental atelectasis at the left lung base, low lung volumes    I have  personally reviewed and interpreted all radiology studies and my findings are as above.  VENTILATOR SETTINGS:    Cultures 11/3 GI panel by PCR pending   Antimicrobials: Anti-infectives (From admission, onward)   None       Devices    LINES / TUBES:      Continuous Infusions: . 0.9 % NaCl with KCl 40 mEq / L 125 mL/hr (03/11/19 0918)     Objective: Vitals:   03/11/19 0327 03/11/19 0400 03/11/19 0755 03/11/19 1611  BP: 103/71 99/67 110/74 111/71  Pulse: 92 73 81 72  Resp:  16 18 20   Temp: 97.7 F (36.5 C) 97.7 F (36.5 C) 97.8 F (36.6 C) 98.3 F (36.8 C)  TempSrc: Oral Oral Oral Oral  SpO2: 96% 96% 96% 94%  Weight:      Height:        Intake/Output Summary (Last 24 hours) at 03/11/2019 1623 Last data filed at 03/11/2019 1535 Gross per 24 hour  Intake 2348 ml  Output -  Net 2348 ml   Filed Weights   03/10/19 2038  Weight: 105.2 kg    Examination:  General: A/O x4 no acute respiratory distress Eyes: negative scleral hemorrhage, negative anisocoria, negative icterus ENT: Negative Runny nose, negative gingival bleeding, Neck:  Negative scars, masses, torticollis, lymphadenopathy, JVD Lungs: Clear to auscultation bilaterally without wheezes or crackles Cardiovascular: Regular rate and rhythm without murmur gallop or rub normal S1 and S2 Abdomen: Positive abdominal pain LUQ/LLQ, positive distention, positive hyperactive bowel sounds, no rebound, no ascites, no appreciable mass Extremities: No significant cyanosis, clubbing, or edema bilateral lower extremities Skin: Negative rashes, lesions, ulcers Psychiatric:  Negative depression, negative anxiety, negative fatigue, negative mania  Central nervous system:  Cranial nerves II through XII intact, tongue/uvula midline, all extremities muscle strength 5/5, sensation intact throughout,negative dysarthria, negative expressive aphasia, negative receptive aphasia.  .     Data Reviewed: Care during the  described time interval was provided by me .  I have reviewed this patient's available data, including medical history, events of note, physical examination, and all test results as part of my evaluation.   CBC: Recent Labs  Lab 03/10/19 2102 03/11/19 0847  WBC 11.4* 10.1  NEUTROABS 9.4* 8.1*  HGB 17.2* 16.0  HCT 49.8 46.3  MCV 92.2 93.3  PLT 209 99991111   Basic Metabolic Panel: Recent Labs  Lab 03/10/19 2102 03/11/19 0847  NA 134* 136  K 3.2* 3.4*  CL 101 106  CO2 19* 20*  GLUCOSE 139* 167*  BUN 21* 20  CREATININE 1.29* 1.09  CALCIUM 8.9 8.7*  MG  --  2.2  PHOS  --  3.3   GFR: Estimated Creatinine Clearance: 99.3 mL/min (by C-G formula based on SCr of 1.09 mg/dL). Liver Function Tests: Recent Labs  Lab 03/10/19 2102 03/11/19 0847  AST 44* 34  ALT 69* 55*  ALKPHOS 74 69  BILITOT 0.8 0.9  PROT 7.9 7.1  ALBUMIN 4.4 4.0   No results for  input(s): LIPASE, AMYLASE in the last 168 hours. No results for input(s): AMMONIA in the last 168 hours. Coagulation Profile: No results for input(s): INR, PROTIME in the last 168 hours. Cardiac Enzymes: No results for input(s): CKTOTAL, CKMB, CKMBINDEX, TROPONINI in the last 168 hours. BNP (last 3 results) No results for input(s): PROBNP in the last 8760 hours. HbA1C: No results for input(s): HGBA1C in the last 72 hours. CBG: No results for input(s): GLUCAP in the last 168 hours. Lipid Profile: Recent Labs    03/10/19 2103  TRIG 252*   Thyroid Function Tests: No results for input(s): TSH, T4TOTAL, FREET4, T3FREE, THYROIDAB in the last 72 hours. Anemia Panel: Recent Labs    03/10/19 2103  FERRITIN 1,200*   Urine analysis:    Component Value Date/Time   COLORURINE YELLOW 03/03/2019 1714   APPEARANCEUR CLEAR 03/03/2019 1714   LABSPEC 1.009 03/03/2019 1714   PHURINE 7.0 03/03/2019 1714   GLUCOSEU NEGATIVE 03/03/2019 1714   HGBUR NEGATIVE 03/03/2019 1714   BILIRUBINUR NEGATIVE 03/03/2019 1714   KETONESUR NEGATIVE  03/03/2019 1714   PROTEINUR NEGATIVE 03/03/2019 1714   UROBILINOGEN 0.2 03/31/2014 0150   NITRITE NEGATIVE 03/03/2019 1714   LEUKOCYTESUR NEGATIVE 03/03/2019 1714   Sepsis Labs: @LABRCNTIP (procalcitonin:4,lacticidven:4)  ) Recent Results (from the past 240 hour(s))  SARS CORONAVIRUS 2 (TAT 6-24 HRS) Nasopharyngeal Nasopharyngeal Swab     Status: Abnormal   Collection Time: 03/03/19  5:14 PM   Specimen: Nasopharyngeal Swab  Result Value Ref Range Status   SARS Coronavirus 2 POSITIVE (A) NEGATIVE Final    Comment: RESULT CALLED TO, READ BACK BY AND VERIFIED WITH: EMAILED TO Aretta Nip 0121 03/04/2019 T. TYSOR (NOTE) SARS-CoV-2 target nucleic acids are DETECTED. The SARS-CoV-2 RNA is generally detectable in upper and lower respiratory specimens during the acute phase of infection. Positive results are indicative of active infection with SARS-CoV-2. Clinical  correlation with patient history and other diagnostic information is necessary to determine patient infection status. Positive results do  not rule out bacterial infection or co-infection with other viruses. The expected result is Negative. Fact Sheet for Patients: SugarRoll.be Fact Sheet for Healthcare Providers: https://www.woods-mathews.com/ This test is not yet approved or cleared by the [place] FDA and  has been authorized for detection and/or diagnosis of SARS-CoV-2 by FDA under an Emergency Use Authorization (EUA). This EUA will remain  in effect (meaning this test can be  used) for the duration of the COVID-19 declaration under Section 564(b)(1) of the Act, 21 U.S.C. section 360bbb-3(b)(1), unless the authorization is terminated or revoked sooner. Performed at [facility], [place] [address], [place], [place] [address]   Blood Culture (routine x 2)     Status: None (Preliminary result)   Collection Time: 03/10/19  9:02 PM   Specimen: Right Antecubital; Blood   Result Value Ref Range Status   Specimen Description RIGHT ANTECUBITAL  Final   Special Requests   Final    BOTTLES DRAWN AEROBIC AND ANAEROBIC Blood Culture adequate volume   Culture   Final    NO GROWTH < 12 HOURS Performed at [facility], [address], [place], [place] [address]    Report Status PENDING  Incomplete  Blood Culture (routine x 2)     Status: None (Preliminary result)   Collection Time: 03/10/19  9:03 PM   Specimen: Left Antecubital; Blood  Result Value Ref Range Status   Specimen Description LEFT ANTECUBITAL  Final   Special Requests   Final  BOTTLES DRAWN AEROBIC AND ANAEROBIC Blood Culture adequate volume   Culture   Final    NO GROWTH < 12 HOURS Performed at [facility], [address], [place], [place] [address]    Report Status PENDING  Incomplete         Radiology Studies: Dg Chest Port 1 View  Result Date: 03/10/2019 CLINICAL DATA:  Shortness of breath and chest pain for 1 hour. Positive COVID-19 test 6 days ago. EXAM: PORTABLE CHEST 1 VIEW COMPARISON:  03/03/2019 FINDINGS: Low lung volumes are present, causing crowding of the pulmonary vasculature. Hazy perihilar densities probably reflection of the vascular crowding. Linear subsegmental atelectasis at the left lung base. Otherwise no overt airspace opacity is identified. Cardiac and mediastinal margins appear normal. No blunting of the costophrenic angles. IMPRESSION: 1. Low lung volumes are present, causing crowding of the pulmonary vasculature. 2. Minimal subsegmental atelectasis at the left lung base. Electronically Signed   By: Van Clines M.D.   On: 03/10/2019 21:10        Scheduled Meds: . albuterol  2 puff Inhalation Q4H  . dexamethasone (DECADRON) injection  6 mg Intravenous Q24H  . [START ON 03/12/2019] enoxaparin (LOVENOX) injection  40 mg Subcutaneous Q24H  . levothyroxine  300 mcg Oral q morning - 10a  . loratadine  10 mg Oral Daily  . pantoprazole  40 mg Oral Daily    Continuous Infusions: . 0.9 % NaCl with KCl 40 mEq / L 125 mL/hr (03/11/19 0918)     LOS: 0 days   The patient is critically ill with multiple organ systems failure and requires high complexity decision making for assessment and support, frequent evaluation and titration of therapies, application of advanced monitoring technologies and extensive interpretation of multiple databases. Critical Care Time devoted to patient care services described in this note  Time spent: 40 minutes     WOODS, Geraldo Docker, MD [facility] Pager [phone]  If 7PM-7AM, please contact night-coverage [email or web address] Password [facility] 03/11/2019, 4:23 PM

## 2019-03-11 NOTE — Progress Notes (Signed)
MD text paged in epic and in Amion of 2.5 Critical Lab Value Lactic Acid

## 2019-03-12 ENCOUNTER — Encounter (HOSPITAL_COMMUNITY): Payer: BC Managed Care – PPO

## 2019-03-12 DIAGNOSIS — R112 Nausea with vomiting, unspecified: Secondary | ICD-10-CM

## 2019-03-12 LAB — CBC WITH DIFFERENTIAL/PLATELET
Abs Immature Granulocytes: 0.18 10*3/uL — ABNORMAL HIGH (ref 0.00–0.07)
Basophils Absolute: 0.1 10*3/uL (ref 0.0–0.1)
Basophils Relative: 1 %
Eosinophils Absolute: 0.2 10*3/uL (ref 0.0–0.5)
Eosinophils Relative: 2 %
HCT: 43.4 % (ref 39.0–52.0)
Hemoglobin: 14.6 g/dL (ref 13.0–17.0)
Immature Granulocytes: 3 %
Lymphocytes Relative: 11 %
Lymphs Abs: 0.8 10*3/uL (ref 0.7–4.0)
MCH: 31.8 pg (ref 26.0–34.0)
MCHC: 33.6 g/dL (ref 30.0–36.0)
MCV: 94.6 fL (ref 80.0–100.0)
Monocytes Absolute: 0.3 10*3/uL (ref 0.1–1.0)
Monocytes Relative: 4 %
Neutro Abs: 5.7 10*3/uL (ref 1.7–7.7)
Neutrophils Relative %: 79 %
Platelets: 203 10*3/uL (ref 150–400)
RBC: 4.59 MIL/uL (ref 4.22–5.81)
RDW: 13 % (ref 11.5–15.5)
WBC: 7.3 10*3/uL (ref 4.0–10.5)
nRBC: 0 % (ref 0.0–0.2)

## 2019-03-12 LAB — COMPREHENSIVE METABOLIC PANEL
ALT: 46 U/L — ABNORMAL HIGH (ref 0–44)
AST: 25 U/L (ref 15–41)
Albumin: 3.5 g/dL (ref 3.5–5.0)
Alkaline Phosphatase: 66 U/L (ref 38–126)
Anion gap: 11 (ref 5–15)
BUN: 13 mg/dL (ref 6–20)
CO2: 21 mmol/L — ABNORMAL LOW (ref 22–32)
Calcium: 8.6 mg/dL — ABNORMAL LOW (ref 8.9–10.3)
Chloride: 104 mmol/L (ref 98–111)
Creatinine, Ser: 0.99 mg/dL (ref 0.61–1.24)
GFR calc Af Amer: >60 mL/min (ref >60)
GFR calc non Af Amer: >60 mL/min (ref >60)
Glucose, Bld: 146 mg/dL — ABNORMAL HIGH (ref 70–99)
Potassium: 4.5 mmol/L (ref 3.5–5.1)
Sodium: 136 mmol/L (ref 135–145)
Total Bilirubin: 0.4 mg/dL (ref 0.3–1.2)
Total Protein: 6.7 g/dL (ref 6.5–8.1)

## 2019-03-12 LAB — MAGNESIUM: Magnesium: 2.2 mg/dL (ref 1.7–2.4)

## 2019-03-12 LAB — D-DIMER, QUANTITATIVE: D-Dimer, Quant: <0.27 ug/mL-FEU (ref 0.00–0.50)

## 2019-03-12 LAB — PHOSPHORUS: Phosphorus: 3.3 mg/dL (ref 2.5–4.6)

## 2019-03-12 LAB — FERRITIN: Ferritin: 970 ng/mL — ABNORMAL HIGH (ref 24–336)

## 2019-03-12 LAB — PROCALCITONIN: Procalcitonin: 0.25 ng/mL

## 2019-03-12 LAB — LACTATE DEHYDROGENASE: LDH: 192 U/L (ref 98–192)

## 2019-03-12 LAB — C-REACTIVE PROTEIN: CRP: 3.2 mg/dL — ABNORMAL HIGH (ref <1.0)

## 2019-03-12 NOTE — Consult Note (Signed)
Patient's case discussed with Dr. Dia Crawford.  Chart consult recommendations as per below.  Patient was not seen or examined in person.  Patient with progressive nausea, vomiting and diarrhea.  Also some lower abdominal pain.  No reported blood in stool.  Prior history of what sounds like diverticulitis with sigmoid colectomy.  Patient is COVID+; has normal WBC but significantly elevated inflammatory markers.  Liver enzymes and albumin level currently normal.  Stool GI pathogen panel pending.  Patient with history of abdominal pain on many occasions, some with colitis/diverticulitis, and has had several (over 10) CT scans over the past few years, but the last 5 or 6 of which (done for similar abdominal pain, diarrhea and vomiting symptoms) were unremarkable, specifically did not show colitis or diverticulitis, including scan done yesterday.  Assessment:  --Chronic intermittent abdominal pain, nausea, vomiting, diarrhea.  No acute findings on CT scan.  Multiple prior CT scans for similar indications.  Suspect some component of chronic abdominal pain, but may very well have superimposed infectious (COVID or otherwise) component.  Plan:  1.  Treat GI symptoms supportively. 2.  Awaiting GI pathogen panel. 3.  No indication for further GI work-up at the present time. 4.  [facility] will follow along at a distance.

## 2019-03-12 NOTE — Progress Notes (Signed)
Pt walked in the hall without O2. His Sat was 85% on return but recovered within 1 minute. No supplemental O2 used.

## 2019-03-12 NOTE — Progress Notes (Signed)
Tele monitor alarming apnea, writer went to patient room, observed patient lying in bed with eyes closed, patient awaken by tap on shoulder. Patient advised he  Has sleep apnea with use of continuous positive airway pressure (CPAP) at home. Place 2LNC  supplemental oxygen. Will continue to monitor.

## 2019-03-12 NOTE — Progress Notes (Addendum)
Unable to collect stool specimen, no bowel movements this shift. Will report off to oncoming shift for follow up.

## 2019-03-12 NOTE — TOC Initial Note (Signed)
Transition of Care [facility]) - Initial/Assessment Note    Patient Details  Name: Andrew Love MRN: [id number] Date of Birth: 02-12-1966  [facility]) CM/SW Contact:    Ninfa Meeker, RN Phone Number: [phone] (working remotely) 03/12/2019, 9:49 AM  Clinical Narrative:  Patient is a  53 y.o. male, from home with wife,has Hx HTN, CAD, OSA on CPAP, diverticulitis, colitis, chronic hip pain, chronic insomnia. Presents for third time to ED  with concerns regarding his Covid infection. Has complaints of low oxygen and difficulty breathing. Apparently his oximeter at home shows O2 sats in the mid 70s but evaluation in the ER hasrevealedO2 sats in the mid 90s with negative chest x-ray. Patient admitted for further evaluation. On IV fluids, IV Decadron, Remdesivir, on Room air with sats 95%. Case manager will continue to monitor.                    Patient Goals and CMS Choice        Expected Discharge Plan and Services                                                Prior Living Arrangements/Services                       Activities of Daily Living      Permission Sought/Granted                  Emotional Assessment              Admission diagnosis:  Possible covid  Patient Active Problem List   Diagnosis Date Noted  . Diarrhea due to COVID-19 03/11/2019  . Vomiting 03/10/2019  . COVID-19 virus infection 03/10/2019  . Hypokalemia 03/10/2019  . AKI (acute kidney injury) ([place]) 03/10/2019  . Metabolic acidosis 99991111  . Lumbar radiculopathy 01/26/2019  . LUQ pain 06/19/2016  . Symptomatic cholelithiasis 08/30/2015  . Preoperative cardiovascular examination   . Coronary artery disease involving native coronary artery of native heart without angina pectoris   . Gastroenteritis 08/26/2015  . Cholelithiases 08/24/2015  . Abdominal pain 08/24/2015  . Cholecystitis, acute 08/24/2015  . Diarrhea 08/24/2015  . Acute  cholecystitis 08/24/2015  . Nausea without vomiting 04/08/2015  . Diverticulitis of colon   . Cerebral thrombosis with cerebral infarction ([place]) 03/21/2014  . Stroke ([place]) 03/21/2014  . Essential hypertension 03/21/2014  . TIA (transient ischemic attack)   . HLD (hyperlipidemia)   . Thyroid activity decreased   . Colitis 11/20/2012  . CAD (coronary artery disease)   . Hip pain 08/04/2011  . 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 LIVER FUNCTION TESTS, ABNORMAL, HX OF 12/17/2008   PCP:  Redmond School, MD Pharmacy:   [place], [place] - [address] [place] [address] Phone: [phone] Fax: [phone]  [facility] [place], [place] [address] Phone: [phone] Fax: [phone]     Social Determinants of Health (SDOH) Interventions    Readmission Risk Interventions No flowsheet data found.

## 2019-03-12 NOTE — Progress Notes (Addendum)
PROGRESS NOTE    Andrew Love  [id number] DOB: 1965-07-21 DOA: 03/10/2019 PCP: Redmond School, MD   Brief Narrative:  Per HPI 53 y.o.WM PMHx HTN, CAD, OSA on CPAP, diverticulitis, colitis, chronic hip pain, chronic insomnia  Presents with concerns regarding his Covid infection. This is his third visit to the ER with complaints of low oxygen and difficulty breathing. Apparently his oximeter at home shows O2 sats in the mid 70s but evaluation in the ER hasrevealedO2 sats in the mid 90s with negative chest x-ray. Patient notes his wife had been admitted to [facility] with Covid and is somewhat frustrated that he is not getting the help that he feels that he needs.  Today patient presents complaining of vomiting x3 days, decreased p.o. intake, dizziness, chest discomfort, diaphoresis, shortness of breath and palpitations. Patient notes that he has only been able to eat grilled chicken and that he sometimes vomits that up as well. He notes that his chest hurts from vomiting. He notes he feels dizzy every time he gets up. Patient denies dizziness when he is sitting in bed. He also feels that he has to vomit every time he gets up as well. Patient is frustrated that his pulse oximeter at home is so low but "I am always in the 90s when I come here".  ED Course:The patient was noted to be somewhat tachycardic and was noted have rales on exam although he has a negative chest x-ray and no oxygen requirement. He was noted to be nauseated and was treated with Zofran. He was noted to be slightly hypokalemic as well with mild renal insufficiency. He was treated with normal saline at 125 cc an hour. Patient was treated empirically with azithromycin for the rales on chest x-ray.  Assessment & Plan:   Principal Problem:   Vomiting Active Problems:   Essential hypertension   Coronary artery disease involving native coronary artery of native heart without angina pectoris   COVID-19  virus infection   Hypokalemia   AKI (acute kidney injury) (HCC)   Metabolic acidosis   Diarrhea due to COVID-19   COVID 19 Pneumonia  Diarrhea CXR from 11/2 with low lung volumes - subsegmental atelectasis CT abdomen/pelvis with evidence of COVID 19 viral pneumonia Currently on RA, will have pt walk with nursing to determine if he drops his sats Remdesivir (day 2) - given multiple ED visits prior to admission and now with pneumonia on CXR, will consider completion of course prior to discharge.  Follow course. Dexamethasone Daily labs (CBC, CMP, D dimer, CRP, Ferritin) - with pleuritic CP worsened today, d dimer notable wnl.  Ferritin elevated but downtrending.  CRP doing the same.  PCT trending up.   Diarrhea improved at this time, continue to monitor -> GI path panel and lactoferrin pending collection. GI recs noted CXR in AM  Nausea  Vomiting 2/2 above, continue to monitor Elevated lactate yesterday AM, follow tomorrow AM (pt stable at this time without concerning symptoms)  HTN Not on any meds, follow outpatient  Hypothyroidism Continue synthroid  Diarrhea As above  DVT prophylaxis: (lovenox Code Status: full  Family Communication: he plans to discuss with wife, encouraged him to let me know if I need to call (no answer when I called wife)  Disposition Plan: pending further improvement, completion of remdesivir   Consultants:   GI  Procedures:   none  Antimicrobials:  Anti-infectives (From admission, onward)   Start     Dose/Rate Route Frequency Ordered Stop  03/12/19 1600  remdesivir 100 mg in sodium chloride 0.9 % 250 mL IVPB     100 mg 500 mL/hr over 30 Minutes Intravenous Every 24 hours 03/11/19 1624 03/16/19 1559   03/11/19 1800  remdesivir 200 mg in sodium chloride 0.9 % 250 mL IVPB     200 mg 500 mL/hr over 30 Minutes Intravenous Once 03/11/19 1624 03/11/19 1832      Subjective: Pleuritc CP noted today Diarrhea better Generally feels better,  but not to baseline  Objective: Vitals:   03/11/19 1611 03/11/19 1953 03/12/19 0325 03/12/19 0802  BP: 111/71 105/75  125/81  Pulse: 72 68  (!) 56  Resp: 20 19  16   Temp: 98.3 F (36.8 C) 98.1 F (36.7 C) 97.8 F (36.6 C) 98 F (36.7 C)  TempSrc: Oral Oral Oral Oral  SpO2: 94% 95%  96%  Weight:      Height:        Intake/Output Summary (Last 24 hours) at 03/12/2019 1218 Last data filed at 03/12/2019 0700 Gross per 24 hour  Intake 3697.33 ml  Output -  Net 3697.33 ml   Filed Weights   03/10/19 2038  Weight: 105.2 kg    Examination:  General exam: Appears calm and comfortable  Respiratory system: unlabored, without accessory muscle use Cardiovascular system: RRR Gastrointestinal system: Abdomen is nondistended, soft and nontender. Central nervous system: Alert and oriented. No focal neurological deficits. Extremities: Symmetric 5 x 5 power. Skin: No rashes, lesions or ulcers Psychiatry: Judgement and insight appear normal. Mood & affect appropriate.     Data Reviewed: I have personally reviewed following labs and imaging studies  CBC: Recent Labs  Lab 03/10/19 2102 03/11/19 0847 03/12/19 0012  WBC 11.4* 10.1 7.3  NEUTROABS 9.4* 8.1* 5.7  HGB 17.2* 16.0 14.6  HCT 49.8 46.3 43.4  MCV 92.2 93.3 94.6  PLT 209 212 [id number]   Basic Metabolic Panel: Recent Labs  Lab 03/10/19 2102 03/11/19 0847 03/12/19 0012  NA 134* 136 136  K 3.2* 3.4* 4.5  CL 101 106 104  CO2 19* 20* 21*  GLUCOSE 139* 167* 146*  BUN 21* 20 13  CREATININE 1.29* 1.09 0.99  CALCIUM 8.9 8.7* 8.6*  MG  --  2.2 2.2  PHOS  --  3.3 3.3   GFR: Estimated Creatinine Clearance: 109.4 mL/min (by C-G formula based on SCr of 0.99 mg/dL). Liver Function Tests: Recent Labs  Lab 03/10/19 2102 03/11/19 0847 03/12/19 0012  AST 44* 34 25  ALT 69* 55* 46*  ALKPHOS 74 69 66  BILITOT 0.8 0.9 0.4  PROT 7.9 7.1 6.7  ALBUMIN 4.4 4.0 3.5   No results for input(s): LIPASE, AMYLASE in the last 168  hours. No results for input(s): AMMONIA in the last 168 hours. Coagulation Profile: No results for input(s): INR, PROTIME in the last 168 hours. Cardiac Enzymes: No results for input(s): CKTOTAL, CKMB, CKMBINDEX, TROPONINI in the last 168 hours. BNP (last 3 results) No results for input(s): PROBNP in the last 8760 hours. HbA1C: No results for input(s): HGBA1C in the last 72 hours. CBG: No results for input(s): GLUCAP in the last 168 hours. Lipid Profile: Recent Labs    03/10/19 2103  TRIG 252*   Thyroid Function Tests: No results for input(s): TSH, T4TOTAL, FREET4, T3FREE, THYROIDAB in the last 72 hours. Anemia Panel: Recent Labs    03/10/19 2103 03/12/19 0012  FERRITIN 1,200* 970*   Sepsis Labs: Recent Labs  Lab 03/10/19 2101 03/10/19 2102 03/10/19 2314  03/11/19 0847 03/12/19 0012  PROCALCITON  --  <0.10  --  0.14 0.25  LATICACIDVEN 2.0*  --  1.4 2.5*  --     Recent Results (from the past 240 hour(s))  SARS CORONAVIRUS 2 (TAT 6-24 HRS) Nasopharyngeal Nasopharyngeal Swab     Status: Abnormal   Collection Time: 03/03/19  5:14 PM   Specimen: Nasopharyngeal Swab  Result Value Ref Range Status   SARS Coronavirus 2 POSITIVE (A) NEGATIVE Final    Comment: RESULT CALLED TO, READ BACK BY AND VERIFIED WITH: EMAILED TO Aretta Nip 0121 03/04/2019 T. TYSOR (NOTE) SARS-CoV-2 target nucleic acids are DETECTED. The SARS-CoV-2 RNA is generally detectable in upper and lower respiratory specimens during the acute phase of infection. Positive results are indicative of active infection with SARS-CoV-2. Clinical  correlation with patient history and other diagnostic information is necessary to determine patient infection status. Positive results do  not rule out bacterial infection or co-infection with other viruses. The expected result is Negative. Fact Sheet for Patients: SugarRoll.be Fact Sheet for Healthcare Providers:  https://www.woods-mathews.com/ This test is not yet approved or cleared by the [place] FDA and  has been authorized for detection and/or diagnosis of SARS-CoV-2 by FDA under an Emergency Use Authorization (EUA). This EUA will remain  in effect (meaning this test can be  used) for the duration of the COVID-19 declaration under Section 564(b)(1) of the Act, 21 U.S.C. section 360bbb-3(b)(1), unless the authorization is terminated or revoked sooner. Performed at [facility], [place] [address], [place], [place] [address]   Blood Culture (routine x 2)     Status: None (Preliminary result)   Collection Time: 03/10/19  9:02 PM   Specimen: Right Antecubital; Blood  Result Value Ref Range Status   Specimen Description RIGHT ANTECUBITAL  Final   Special Requests   Final    BOTTLES DRAWN AEROBIC AND ANAEROBIC Blood Culture adequate volume   Culture   Final    NO GROWTH < 12 HOURS Performed at [facility], [address], [place], [place] [address]    Report Status PENDING  Incomplete  Blood Culture (routine x 2)     Status: None (Preliminary result)   Collection Time: 03/10/19  9:03 PM   Specimen: Left Antecubital; Blood  Result Value Ref Range Status   Specimen Description LEFT ANTECUBITAL  Final   Special Requests   Final    BOTTLES DRAWN AEROBIC AND ANAEROBIC Blood Culture adequate volume   Culture   Final    NO GROWTH < 12 HOURS Performed at [facility], [address] [place], [place] [address]    Report Status PENDING  Incomplete         Radiology Studies: Ct Abdomen Pelvis Wo Contrast  Result Date: 03/11/2019 CLINICAL DATA:  Left-sided abdominal pain. Previous bowel resection for diverticulitis. EXAM: CT ABDOMEN AND PELVIS WITHOUT CONTRAST TECHNIQUE: Multidetector CT imaging of the abdomen and pelvis was performed following the standard protocol without IV contrast. COMPARISON:  07/20/2017 FINDINGS: Lower chest: New patchy areas of airspace disease are  seen in both lung bases, consistent with infection. Features are suspicious for COVID-19 viral pneumonia. No evidence of pleural effusion. Hepatobiliary: No mass visualized on this unenhanced exam. Prior cholecystectomy. No evidence of biliary obstruction. Pancreas: No mass or inflammatory process visualized on this unenhanced exam. Spleen:  Within normal limits in size. Adrenals/Urinary tract: No evidence of urolithiasis or hydronephrosis. Unremarkable unopacified urinary bladder. Stomach/Bowel: No evidence of obstruction, inflammatory process, or abnormal fluid  collections. Mild diverticulosis is seen involving the sigmoid colon, however, there is no evidence of diverticulitis. Vascular/Lymphatic: No pathologically enlarged lymph nodes identified. No evidence of abdominal aortic aneurysm. Aortic atherosclerosis. Reproductive:  No mass or other significant abnormality. Other:  None. Musculoskeletal: No suspicious bone lesions identified. Right hip prosthesis again noted. IMPRESSION: Colonic diverticulosis. No radiographic evidence of diverticulitis or other acute findings within the abdomen or pelvis. New patchy areas of airspace disease in both lung bases, consistent with infection, and suspicious for COVID-19 viral pneumonia. Electronically Signed   By: Marlaine Hind M.D.   On: 03/11/2019 18:21   Dg Chest Port 1 View  Result Date: 03/10/2019 CLINICAL DATA:  Shortness of breath and chest pain for 1 hour. Positive COVID-19 test 6 days ago. EXAM: PORTABLE CHEST 1 VIEW COMPARISON:  03/03/2019 FINDINGS: Low lung volumes are present, causing crowding of the pulmonary vasculature. Hazy perihilar densities probably reflection of the vascular crowding. Linear subsegmental atelectasis at the left lung base. Otherwise no overt airspace opacity is identified. Cardiac and mediastinal margins appear normal. No blunting of the costophrenic angles. IMPRESSION: 1. Low lung volumes are present, causing crowding of the pulmonary  vasculature. 2. Minimal subsegmental atelectasis at the left lung base. Electronically Signed   By: Van Clines M.D.   On: 03/10/2019 21:10        Scheduled Meds: . albuterol  2 puff Inhalation Q4H  . dexamethasone (DECADRON) injection  6 mg Intravenous Q24H  . enoxaparin (LOVENOX) injection  40 mg Subcutaneous Q24H  . levothyroxine  300 mcg Oral q morning - 10a  . loratadine  10 mg Oral Daily  . pantoprazole  40 mg Oral Daily   Continuous Infusions: . 0.9 % NaCl with KCl 40 mEq / L 125 mL/hr (03/12/19 0531)  . remdesivir 100 mg in NS 250 mL       LOS: 1 day    Time spent: over 30 min    Fayrene Helper, MD [facility] Pager AMION  If 7PM-7AM, please contact night-coverage [email or web address] Password [email or web address] 03/12/2019, 12:18 PM

## 2019-03-13 ENCOUNTER — Inpatient Hospital Stay (HOSPITAL_COMMUNITY): Payer: BC Managed Care – PPO

## 2019-03-13 LAB — FERRITIN: Ferritin: 1143 ng/mL — ABNORMAL HIGH (ref 24–336)

## 2019-03-13 LAB — COMPREHENSIVE METABOLIC PANEL
ALT: 47 U/L — ABNORMAL HIGH (ref 0–44)
AST: 26 U/L (ref 15–41)
Albumin: 3.6 g/dL (ref 3.5–5.0)
Alkaline Phosphatase: 59 U/L (ref 38–126)
Anion gap: 11 (ref 5–15)
BUN: 18 mg/dL (ref 6–20)
CO2: 22 mmol/L (ref 22–32)
Calcium: 8.6 mg/dL — ABNORMAL LOW (ref 8.9–10.3)
Chloride: 103 mmol/L (ref 98–111)
Creatinine, Ser: 1.13 mg/dL (ref 0.61–1.24)
GFR calc Af Amer: >60 mL/min (ref >60)
GFR calc non Af Amer: >60 mL/min (ref >60)
Glucose, Bld: 171 mg/dL — ABNORMAL HIGH (ref 70–99)
Potassium: 4.5 mmol/L (ref 3.5–5.1)
Sodium: 136 mmol/L (ref 135–145)
Total Bilirubin: 0.6 mg/dL (ref 0.3–1.2)
Total Protein: 6.4 g/dL — ABNORMAL LOW (ref 6.5–8.1)

## 2019-03-13 LAB — CBC WITH DIFFERENTIAL/PLATELET
Abs Immature Granulocytes: 0.11 10*3/uL — ABNORMAL HIGH (ref 0.00–0.07)
Basophils Absolute: 0 10*3/uL (ref 0.0–0.1)
Basophils Relative: 0 %
Eosinophils Absolute: 0 10*3/uL (ref 0.0–0.5)
Eosinophils Relative: 0 %
HCT: 41.4 % (ref 39.0–52.0)
Hemoglobin: 14.2 g/dL (ref 13.0–17.0)
Immature Granulocytes: 2 %
Lymphocytes Relative: 14 %
Lymphs Abs: 1 10*3/uL (ref 0.7–4.0)
MCH: 32.2 pg (ref 26.0–34.0)
MCHC: 34.3 g/dL (ref 30.0–36.0)
MCV: 93.9 fL (ref 80.0–100.0)
Monocytes Absolute: 0.4 10*3/uL (ref 0.1–1.0)
Monocytes Relative: 6 %
Neutro Abs: 5.6 10*3/uL (ref 1.7–7.7)
Neutrophils Relative %: 78 %
Platelets: 208 10*3/uL (ref 150–400)
RBC: 4.41 MIL/uL (ref 4.22–5.81)
RDW: 13 % (ref 11.5–15.5)
WBC: 7.2 10*3/uL (ref 4.0–10.5)
nRBC: 0 % (ref 0.0–0.2)

## 2019-03-13 LAB — LACTIC ACID, PLASMA: Lactic Acid, Venous: 2.1 mmol/L (ref 0.5–1.9)

## 2019-03-13 LAB — PHOSPHORUS: Phosphorus: 4 mg/dL (ref 2.5–4.6)

## 2019-03-13 LAB — C-REACTIVE PROTEIN: CRP: 1.1 mg/dL — ABNORMAL HIGH (ref <1.0)

## 2019-03-13 LAB — LACTATE DEHYDROGENASE: LDH: 184 U/L (ref 98–192)

## 2019-03-13 LAB — MAGNESIUM: Magnesium: 2.2 mg/dL (ref 1.7–2.4)

## 2019-03-13 LAB — D-DIMER, QUANTITATIVE: D-Dimer, Quant: 0.27 ug/mL-FEU (ref 0.00–0.50)

## 2019-03-13 LAB — PROCALCITONIN: Procalcitonin: 0.1 ng/mL

## 2019-03-13 NOTE — Progress Notes (Signed)
PROGRESS NOTE    Andrew Love  [id number] DOB: Dec 10, 1965 DOA: 03/10/2019 PCP: Redmond School, MD   Brief Narrative:  Per HPI 53 y.o.WM PMHx HTN, CAD, OSA on CPAP, diverticulitis, colitis, chronic hip pain, chronic insomnia  Presents with concerns regarding his Covid infection. This is his third visit to the ER with complaints of low oxygen and difficulty breathing. Apparently his oximeter at home shows O2 sats in the mid 70s but evaluation in the ER hasrevealedO2 sats in the mid 90s with negative chest x-ray. Patient notes his wife had been admitted to [facility] with Covid and is somewhat frustrated that he is not getting the help that he feels that he needs.  Today patient presents complaining of vomiting x3 days, decreased p.o. intake, dizziness, chest discomfort, diaphoresis, shortness of breath and palpitations. Patient notes that he has only been able to eat grilled chicken and that he sometimes vomits that up as well. He notes that his chest hurts from vomiting. He notes he feels dizzy every time he gets up. Patient denies dizziness when he is sitting in bed. He also feels that he has to vomit every time he gets up as well. Patient is frustrated that his pulse oximeter at home is so low but "I am always in the 90s when I come here".  ED Course:The patient was noted to be somewhat tachycardic and was noted have rales on exam although he has a negative chest x-ray and no oxygen requirement. He was noted to be nauseated and was treated with Zofran. He was noted to be slightly hypokalemic as well with mild renal insufficiency. He was treated with normal saline at 125 cc an hour. Patient was treated empirically with azithromycin for the rales on chest x-ray.  Assessment & Plan:   Principal Problem:   Vomiting Active Problems:   Essential hypertension   Coronary artery disease involving native coronary artery of native heart without angina pectoris   COVID-19  virus infection   Hypokalemia   AKI (acute kidney injury) (HCC)   Metabolic acidosis   Diarrhea due to COVID-19   COVID 19 Pneumonia  Diarrhea CXR from 11/2 with low lung volumes - subsegmental atelectasis CT abdomen/pelvis with evidence of COVID 19 viral pneumonia Currently on RA - desatted with ambulation yesterday, will continue to follow Remdesivir (day 3) - given multiple ED visits prior to admission and now with pneumonia on CXR, will consider completion of course prior to discharge.  Follow course. Dexamethasone Daily labs (CBC, CMP, D dimer, CRP, Ferritin) - ferritin elevated, other inflammatory markers downtrending Diarrhea improved at this time, continue to monitor -> GI path panel and lactoferrin pending collection. GI recs noted CXR without acute abnormality  COVID-19 Labs  Recent Labs    03/10/19 2103 03/11/19 0847 03/11/19 1630 03/12/19 0012 03/13/19 0150  DDIMER  --  0.34  --  <0.27 0.27  FERRITIN 1,200*  --   --  970* 1,143*  LDH  --   --  181 192 184  CRP 4.4* 5.1*  --  3.2* 1.1*    Lab Results  Component Value Date   SARSCOV2NAA POSITIVE (A) 03/03/2019   SARSCOV2NAA Not Detected 02/26/2019   [place] NEGATIVE 01/26/2019   [place] Not Detected 01/07/2019    Nausea  Vomiting 2/2 above, continue to monitor Elevated lactate yesterday AM, downtrending, mildly elevated, follow  HTN Not on any meds, follow outpatient  Hypothyroidism Continue synthroid  Diarrhea As above  DVT prophylaxis: (lovenox Code Status: full  Family Communication: none at bedside, discussed poc with pt Disposition Plan: pending further improvement, completion of remdesivir   Consultants:   GI  Procedures:   none  Antimicrobials:  Anti-infectives (From admission, onward)   Start     Dose/Rate Route Frequency Ordered Stop   03/12/19 1600  remdesivir 100 mg in sodium chloride 0.9 % 250 mL IVPB     100 mg 500 mL/hr over 30 Minutes Intravenous Every 24  hours 03/11/19 1624 03/16/19 1559   03/11/19 1800  remdesivir 200 mg in sodium chloride 0.9 % 250 mL IVPB     200 mg 500 mL/hr over 30 Minutes Intravenous Once 03/11/19 1624 03/11/19 1832      Subjective: Continued pleuritic CP Nausea after lunch, not feeling well at this time  Objective: Vitals:   03/12/19 1449 03/12/19 1948 03/13/19 0537 03/13/19 0755  BP: 115/79 107/70 118/75 112/68  Pulse:  (!) 58  60  Resp: 16 16  18   Temp: 99.4 F (37.4 C) 98 F (36.7 C) 98.5 F (36.9 C) 98.5 F (36.9 C)  TempSrc: Oral Other (Comment) Oral Oral  SpO2: 95% 95% 95% 95%  Weight:      Height:        Intake/Output Summary (Last 24 hours) at 03/13/2019 1547 Last data filed at 03/13/2019 1349 Gross per 24 hour  Intake 1206 ml  Output -  Net 1206 ml   Filed Weights   03/10/19 2038  Weight: 105.2 kg    Examination:  General: No acute distress. Cardiovascular: RRR Lungs: unlabored, no accessory muscle use Abdomen: Soft, nontender, nondistended  Neurological: Alert and oriented 3. Moves all extremities 4. Cranial nerves II through XII grossly intact. Skin: Warm and dry. No rashes or lesions. Extremities: No clubbing or cyanosis. No edema.   Data Reviewed: I have personally reviewed following labs and imaging studies  CBC: Recent Labs  Lab 03/10/19 2102 03/11/19 0847 03/12/19 0012 03/13/19 0150  WBC 11.4* 10.1 7.3 7.2  NEUTROABS 9.4* 8.1* 5.7 5.6  HGB 17.2* 16.0 14.6 14.2  HCT 49.8 46.3 43.4 41.4  MCV 92.2 93.3 94.6 93.9  PLT 209 212 203 [id number]   Basic Metabolic Panel: Recent Labs  Lab 03/10/19 2102 03/11/19 0847 03/12/19 0012 03/13/19 0150  NA 134* 136 136 136  K 3.2* 3.4* 4.5 4.5  CL 101 106 104 103  CO2 19* 20* 21* 22  GLUCOSE 139* 167* 146* 171*  BUN 21* 20 13 18   CREATININE 1.29* 1.09 0.99 1.13  CALCIUM 8.9 8.7* 8.6* 8.6*  MG  --  2.2 2.2 2.2  PHOS  --  3.3 3.3 4.0   GFR: Estimated Creatinine Clearance: 95.8 mL/min (by C-G formula based on SCr of 1.13  mg/dL). Liver Function Tests: Recent Labs  Lab 03/10/19 2102 03/11/19 0847 03/12/19 0012 03/13/19 0150  AST 44* 34 25 26  ALT 69* 55* 46* 47*  ALKPHOS 74 69 66 59  BILITOT 0.8 0.9 0.4 0.6  PROT 7.9 7.1 6.7 6.4*  ALBUMIN 4.4 4.0 3.5 3.6   No results for input(s): LIPASE, AMYLASE in the last 168 hours. No results for input(s): AMMONIA in the last 168 hours. Coagulation Profile: No results for input(s): INR, PROTIME in the last 168 hours. Cardiac Enzymes: No results for input(s): CKTOTAL, CKMB, CKMBINDEX, TROPONINI in the last 168 hours. BNP (last 3 results) No results for input(s): PROBNP in the last 8760 hours. HbA1C: No results for input(s): HGBA1C in the last 72 hours. CBG: No results for input(s):  GLUCAP in the last 168 hours. Lipid Profile: Recent Labs    03/10/19 2103  TRIG 252*   Thyroid Function Tests: No results for input(s): TSH, T4TOTAL, FREET4, T3FREE, THYROIDAB in the last 72 hours. Anemia Panel: Recent Labs    03/12/19 0012 03/13/19 0150  FERRITIN 970* 1,143*   Sepsis Labs: Recent Labs  Lab 03/10/19 2101 03/10/19 2102 03/10/19 2314 03/11/19 0847 03/12/19 0012 03/13/19 0150  PROCALCITON  --  <0.10  --  0.14 0.25 0.10  LATICACIDVEN 2.0*  --  1.4 2.5*  --  2.1*    Recent Results (from the past 240 hour(s))  SARS CORONAVIRUS 2 (TAT 6-24 HRS) Nasopharyngeal Nasopharyngeal Swab     Status: Abnormal   Collection Time: 03/03/19  5:14 PM   Specimen: Nasopharyngeal Swab  Result Value Ref Range Status   SARS Coronavirus 2 POSITIVE (A) NEGATIVE Final    Comment: RESULT CALLED TO, READ BACK BY AND VERIFIED WITH: EMAILED TO Aretta Nip 0121 03/04/2019 T. TYSOR (NOTE) SARS-CoV-2 target nucleic acids are DETECTED. The SARS-CoV-2 RNA is generally detectable in upper and lower respiratory specimens during the acute phase of infection. Positive results are indicative of active infection with SARS-CoV-2. Clinical  correlation with patient history and other  diagnostic information is necessary to determine patient infection status. Positive results do  not rule out bacterial infection or co-infection with other viruses. The expected result is Negative. Fact Sheet for Patients: SugarRoll.be Fact Sheet for Healthcare Providers: https://www.woods-mathews.com/ This test is not yet approved or cleared by the [place] FDA and  has been authorized for detection and/or diagnosis of SARS-CoV-2 by FDA under an Emergency Use Authorization (EUA). This EUA will remain  in effect (meaning this test can be  used) for the duration of the COVID-19 declaration under Section 564(b)(1) of the Act, 21 U.S.C. section 360bbb-3(b)(1), unless the authorization is terminated or revoked sooner. Performed at [facility], [place] [address] [place], [place] [address]   Blood Culture (routine x 2)     Status: None (Preliminary result)   Collection Time: 03/10/19  9:02 PM   Specimen: Right Antecubital; Blood  Result Value Ref Range Status   Specimen Description RIGHT ANTECUBITAL  Final   Special Requests   Final    BOTTLES DRAWN AEROBIC AND ANAEROBIC Blood Culture adequate volume   Culture   Final    NO GROWTH 3 DAYS Performed at [facility], [address] [place], [place] [address]    Report Status PENDING  Incomplete  Blood Culture (routine x 2)     Status: None (Preliminary result)   Collection Time: 03/10/19  9:03 PM   Specimen: Left Antecubital; Blood  Result Value Ref Range Status   Specimen Description LEFT ANTECUBITAL  Final   Special Requests   Final    BOTTLES DRAWN AEROBIC AND ANAEROBIC Blood Culture adequate volume   Culture   Final    NO GROWTH 3 DAYS Performed at [facility], [address], [place], [place] [address]    Report Status PENDING  Incomplete         Radiology Studies: Ct Abdomen Pelvis Wo Contrast  Result Date: 03/11/2019 CLINICAL DATA:  Left-sided abdominal pain. Previous  bowel resection for diverticulitis. EXAM: CT ABDOMEN AND PELVIS WITHOUT CONTRAST TECHNIQUE: Multidetector CT imaging of the abdomen and pelvis was performed following the standard protocol without IV contrast. COMPARISON:  07/20/2017 FINDINGS: Lower chest: New patchy areas of airspace disease are seen in both lung bases, consistent with  infection. Features are suspicious for COVID-19 viral pneumonia. No evidence of pleural effusion. Hepatobiliary: No mass visualized on this unenhanced exam. Prior cholecystectomy. No evidence of biliary obstruction. Pancreas: No mass or inflammatory process visualized on this unenhanced exam. Spleen:  Within normal limits in size. Adrenals/Urinary tract: No evidence of urolithiasis or hydronephrosis. Unremarkable unopacified urinary bladder. Stomach/Bowel: No evidence of obstruction, inflammatory process, or abnormal fluid collections. Mild diverticulosis is seen involving the sigmoid colon, however, there is no evidence of diverticulitis. Vascular/Lymphatic: No pathologically enlarged lymph nodes identified. No evidence of abdominal aortic aneurysm. Aortic atherosclerosis. Reproductive:  No mass or other significant abnormality. Other:  None. Musculoskeletal: No suspicious bone lesions identified. Right hip prosthesis again noted. IMPRESSION: Colonic diverticulosis. No radiographic evidence of diverticulitis or other acute findings within the abdomen or pelvis. New patchy areas of airspace disease in both lung bases, consistent with infection, and suspicious for COVID-19 viral pneumonia. Electronically Signed   By: Marlaine Hind M.D.   On: 03/11/2019 18:21   Dg Chest Port 1 View  Result Date: 03/13/2019 CLINICAL DATA:  Shortness of breath EXAM: PORTABLE CHEST 1 VIEW COMPARISON:  03/10/2019 FINDINGS: The heart size and mediastinal contours are within normal limits. Both lungs are clear. The visualized skeletal structures are unremarkable. IMPRESSION: No acute abnormality of the  lungs in AP portable projection. Electronically Signed   By: Eddie Candle M.D.   On: 03/13/2019 09:48        Scheduled Meds: . albuterol  2 puff Inhalation Q4H  . dexamethasone (DECADRON) injection  6 mg Intravenous Q24H  . enoxaparin (LOVENOX) injection  40 mg Subcutaneous Q24H  . levothyroxine  300 mcg Oral q morning - 10a  . loratadine  10 mg Oral Daily  . pantoprazole  40 mg Oral Daily   Continuous Infusions: . remdesivir 100 mg in NS 250 mL 100 mg (03/12/19 1539)     LOS: 2 days    Time spent: over 30 min    Fayrene Helper, MD [facility] Pager AMION  If 7PM-7AM, please contact night-coverage [email or web address] Password [facility] 03/13/2019, 3:47 PM

## 2019-03-14 LAB — CBC WITH DIFFERENTIAL/PLATELET
Abs Immature Granulocytes: 0.25 10*3/uL — ABNORMAL HIGH (ref 0.00–0.07)
Basophils Absolute: 0.1 10*3/uL (ref 0.0–0.1)
Basophils Relative: 1 %
Eosinophils Absolute: 0 10*3/uL (ref 0.0–0.5)
Eosinophils Relative: 0 %
HCT: 42.1 % (ref 39.0–52.0)
Hemoglobin: 14.5 g/dL (ref 13.0–17.0)
Immature Granulocytes: 3 %
Lymphocytes Relative: 17 %
Lymphs Abs: 1.3 10*3/uL (ref 0.7–4.0)
MCH: 31.9 pg (ref 26.0–34.0)
MCHC: 34.4 g/dL (ref 30.0–36.0)
MCV: 92.5 fL (ref 80.0–100.0)
Monocytes Absolute: 0.5 10*3/uL (ref 0.1–1.0)
Monocytes Relative: 6 %
Neutro Abs: 5.8 10*3/uL (ref 1.7–7.7)
Neutrophils Relative %: 73 %
Platelets: 231 10*3/uL (ref 150–400)
RBC: 4.55 MIL/uL (ref 4.22–5.81)
RDW: 12.7 % (ref 11.5–15.5)
WBC: 7.9 10*3/uL (ref 4.0–10.5)
nRBC: 0 % (ref 0.0–0.2)

## 2019-03-14 LAB — COMPREHENSIVE METABOLIC PANEL
ALT: 73 U/L — ABNORMAL HIGH (ref 0–44)
AST: 32 U/L (ref 15–41)
Albumin: 3.6 g/dL (ref 3.5–5.0)
Alkaline Phosphatase: 65 U/L (ref 38–126)
Anion gap: 12 (ref 5–15)
BUN: 21 mg/dL — ABNORMAL HIGH (ref 6–20)
CO2: 23 mmol/L (ref 22–32)
Calcium: 8.7 mg/dL — ABNORMAL LOW (ref 8.9–10.3)
Chloride: 102 mmol/L (ref 98–111)
Creatinine, Ser: 0.95 mg/dL (ref 0.61–1.24)
GFR calc Af Amer: >60 mL/min (ref >60)
GFR calc non Af Amer: >60 mL/min (ref >60)
Glucose, Bld: 140 mg/dL — ABNORMAL HIGH (ref 70–99)
Potassium: 3.9 mmol/L (ref 3.5–5.1)
Sodium: 137 mmol/L (ref 135–145)
Total Bilirubin: 0.7 mg/dL (ref 0.3–1.2)
Total Protein: 6.5 g/dL (ref 6.5–8.1)

## 2019-03-14 LAB — D-DIMER, QUANTITATIVE: D-Dimer, Quant: <0.27 ug/mL-FEU (ref 0.00–0.50)

## 2019-03-14 LAB — PHOSPHORUS: Phosphorus: 4.3 mg/dL (ref 2.5–4.6)

## 2019-03-14 LAB — FERRITIN: Ferritin: 1293 ng/mL — ABNORMAL HIGH (ref 24–336)

## 2019-03-14 LAB — MAGNESIUM: Magnesium: 2.3 mg/dL (ref 1.7–2.4)

## 2019-03-14 LAB — LACTATE DEHYDROGENASE: LDH: 183 U/L (ref 98–192)

## 2019-03-14 LAB — C-REACTIVE PROTEIN: CRP: <0.8 mg/dL (ref <1.0)

## 2019-03-14 NOTE — TOC Transition Note (Signed)
Transition of Care [facility]) - CM/SW Discharge Note   Patient Details  Name: Andrew Love MRN: [id number] Date of Birth: [birth year]  [facility]) CM/SW Contact:  Ninfa Meeker, RN Phone Number: 03/14/2019, 10:06 AM   Clinical Narrative:  Patient has no Case manager needs identified. Will discharge home with family.    Final next level of care: Home/Self Care Barriers to Discharge: No Barriers Identified   Patient Goals and CMS Choice     Choice offered to / list presented to : NA  Discharge Placement                       Discharge Plan and Services   Discharge Planning Services: NA            DME Arranged: N/A DME Agency: NA       HH Arranged: NA HH Agency: NA        Social Determinants of Health (SDOH) Interventions     Readmission Risk Interventions No flowsheet data found.

## 2019-03-14 NOTE — Discharge Summary (Signed)
Physician Discharge Summary  Andrew Love [id number] DOB: 07/13/1965 DOA: 03/10/2019  PCP: Redmond School, MD  Admit date: 03/10/2019 Discharge date: 03/14/2019  Time spent: 40 minutes  Recommendations for Outpatient Follow-up:  1. Follow outpatient CBC/CMP 2. Continue quarantine per [employer] recommendations   Discharge Diagnoses:  Principal Problem:   Vomiting Active Problems:   Essential hypertension   Coronary artery disease involving native coronary artery of native heart without angina pectoris   COVID-19 virus infection   Hypokalemia   AKI (acute kidney injury) ([place])   Metabolic acidosis   Diarrhea due to COVID-19   Discharge Condition: stable  Diet recommendation: heart healthy  Filed Weights   03/10/19 2038  Weight: 105.2 kg    History of present illness:  Per HPI 53 y.o.WM PMHxHTN, CAD, OSA on CPAP, diverticulitis, colitis, chronic hip pain, chronic insomnia  Presents with concerns regarding his Covid infection. This is histhird visit to the ER with complaints of low oxygen and difficulty breathing. Apparently his oximeter at home shows O2 sats in the mid 70s but evaluation in the ER hasrevealedO2 sats in the mid 90s with negative chest x-ray. Patient notes his wife had been admitted to [facility] with Covid and is somewhat frustrated that he is not getting the help that he feels that he needs.  Today patient presents complaining of vomiting x3 days, decreased p.o. intake, dizziness, chest discomfort, diaphoresis, shortness of breath and palpitations. Patient notes that he has only been able to eat grilled chicken and that he sometimes vomits that up as well. He notes that his chest hurts from vomiting. He notes he feels dizzy every time he gets up. Patient denies dizziness when he is sitting in bed. He also feels that he has to vomit every time he gets up as well. Patient is frustrated that his pulse oximeter at home is so low but "I am always in the  90s when I come here".  ED Course:The patient was noted to be somewhat tachycardic and was noted have rales on exam although he has a negative chest x-ray and no oxygen requirement. He was noted to be nauseated and was treated with Zofran. He was noted to be slightly hypokalemic as well with mild renal insufficiency. He was treated with normal saline at 125 cc an hour. Patient was treated empirically with azithromycin for the rales on chest x-ray.  He was admitted for COVID 19 infection with COVID 19 pneumonia.  He was started on steroids and remdesivir.  He's improved with resolved diarrhea and now improved SOB as well.  He was discharged on 11/6 with return precautions.  Hospital Course:  COVID 19 Pneumonia  Diarrhea CXR from 11/2 with low lung volumes - subsegmental atelectasis CT abdomen/pelvis with evidence of COVID 19 viral pneumonia Currently on RA - desatted with ambulation yesterday, will continue to follow Remdesivir (day 4)  Dexamethasone Daily labs (CBC, CMP, D dimer, CRP, Ferritin) - ferritin elevated, other inflammatory markers downtrending Diarrhea resolved GI recs noted CXR without acute abnormality He's improved and is able to ambulate without significant SOB, maintaining his O2 sats.  Discharged today with return precautions.  COVID-19 Labs  Recent Labs    03/12/19 0012 03/13/19 0150 03/14/19 0557  DDIMER <0.27 0.27 <0.27  FERRITIN 970* 1,143* 1,293*  LDH 192 184 183  CRP 3.2* 1.1* <0.8    Lab Results  Component Value Date   SARSCOV2NAA POSITIVE (A) 03/03/2019   [place] Not Detected 02/26/2019   [place] NEGATIVE 01/26/2019  [place] Not Detected 01/07/2019    Nausea  Vomiting resolved  HTN Not on any meds, follow outpatient  Hypothyroidism Continue synthroid  Diarrhea As above  Procedures:  none  Consultations:  GI  Discharge Exam: Vitals:   03/14/19 0412 03/14/19 0750  BP: 112/82 115/75  Pulse: (!) 42 (!) 51   Resp:  16  Temp: 97.7 F (36.5 C) 98.4 F (36.9 C)  SpO2: 93% 95%   Feels well. Comfortable with plan for d/c home.  Walked around the unit and on return noted his O2 sats had not dropped.  Discussed d/c plan   General: No acute distress. Cardiovascular: RRR Lungs: unlabored  Abdomen: Soft, nontender, nondistended  Neurological: Alert and oriented 3. Moves all extremities 4. Cranial nerves II through XII grossly intact. Skin: Warm and dry. No rashes or lesions. Extremities: No clubbing or cyanosis. No edema.  Discharge Instructions   Discharge Instructions    Call MD for:  difficulty breathing, headache or visual disturbances   Complete by: As directed    Call MD for:  extreme fatigue   Complete by: As directed    Call MD for:  persistant dizziness or light-headedness   Complete by: As directed    Call MD for:  persistant nausea and vomiting   Complete by: As directed    Call MD for:  redness, tenderness, or signs of infection (pain, swelling, redness, odor or green/yellow discharge around incision site)   Complete by: As directed    Call MD for:  severe uncontrolled pain   Complete by: As directed    Call MD for:  temperature >100.4   Complete by: As directed    Diet - low sodium heart healthy   Complete by: As directed    Discharge instructions   Complete by: As directed    You were seen for COVID 19 pneumonia.  You've improved with steroids and remdesivir.    COVID 19 is an unpredictable illness, return for new, recurrent, or worsening symptoms.  Avoid ibuprofen as you continue to recover from covid.  You can use tylenol as needed for symptoms.  You should quarantine for 20 days since your symptoms started with at least 24 hours without symptoms (without using any medications).  We held your losartan here, please follow your blood pressures at home as you resume this (watch for lightheadedness or dizziness).  Please ask your PCP to request records from this  hospitalization so they know what was done and what the next steps will be.   Increase activity slowly   Complete by: As directed      Allergies as of 03/14/2019      Reactions   Other Hives, Shortness Of Breath   Cockroaches   Iohexol Hives   Shrimp [shellfish Allergy] Hives   Wheat Bran Hives   Yeast-related Products Hives      Medication List    STOP taking these medications   azithromycin 250 MG tablet Commonly known as: ZITHROMAX   ibuprofen 200 MG tablet Commonly known as: ADVIL   ibuprofen 600 MG tablet Commonly known as: ADVIL   methylPREDNISolone 4 MG Tbpk tablet Commonly known as: MEDROL DOSEPAK   Oxycodone HCl 10 MG Tabs Commonly known as: Roxicodone   oxyCODONE-acetaminophen 5-325 MG tablet Commonly known as: Percocet     TAKE these medications   acetaminophen 500 MG tablet Commonly known as: TYLENOL Take 500-1,000 mg by mouth every 6 (six) hours as needed for mild pain or fever.  Ambien 10 MG tablet Generic drug: zolpidem Take 10 mg by mouth at bedtime.   benzonatate 100 MG capsule Commonly known as: TESSALON Take 1 capsule (100 mg total) by mouth every 8 (eight) hours. What changed:   when to take this  reasons to take this   cetirizine 10 MG chewable tablet Commonly known as: ZYRTEC Chew 1 tablet (10 mg total) by mouth daily.   EPINEPHrine 0.3 mg/0.3 mL Soaj injection Commonly known as: EpiPen Inject 0.3 mLs (0.3 mg total) into the muscle as needed. What changed:   when to take this  reasons to take this   fluticasone 50 MCG/ACT nasal spray Commonly known as: FLONASE Place 2 sprays into both nostrils daily. What changed:   when to take this  reasons to take this   hydrocortisone cream 1 % Apply 1 application topically once as needed for itching.   levothyroxine 300 MCG tablet Commonly known as: SYNTHROID Take 300 mcg by mouth every morning.   meclizine 25 MG tablet Commonly known as: ANTIVERT Take 1 tablet (25 mg  total) by mouth 3 (three) times daily as needed for dizziness.   olmesartan 20 MG tablet Commonly known as: BENICAR Take 20 mg by mouth daily.   ondansetron 4 MG tablet Commonly known as: ZOFRAN Take 1 tablet (4 mg total) by mouth every 6 (six) hours.   pantoprazole 40 MG tablet Commonly known as: PROTONIX TAKE 1 TABLET BY MOUTH ONCE DAILY      Allergies  Allergen Reactions  . Other Hives and Shortness Of Breath    Cockroaches  . Iohexol Hives  . Shrimp [Shellfish Allergy] Hives  . Wheat Bran Hives  . Yeast-Related Products Hives      The results of significant diagnostics from this hospitalization (including imaging, microbiology, ancillary and laboratory) are listed below for reference.    Significant Diagnostic Studies: Ct Abdomen Pelvis Wo Contrast  Result Date: 03/11/2019 CLINICAL DATA:  Left-sided abdominal pain. Previous bowel resection for diverticulitis. EXAM: CT ABDOMEN AND PELVIS WITHOUT CONTRAST TECHNIQUE: Multidetector CT imaging of the abdomen and pelvis was performed following the standard protocol without IV contrast. COMPARISON:  07/20/2017 FINDINGS: Lower chest: New patchy areas of airspace disease are seen in both lung bases, consistent with infection. Features are suspicious for COVID-19 viral pneumonia. No evidence of pleural effusion. Hepatobiliary: No mass visualized on this unenhanced exam. Prior cholecystectomy. No evidence of biliary obstruction. Pancreas: No mass or inflammatory process visualized on this unenhanced exam. Spleen:  Within normal limits in size. Adrenals/Urinary tract: No evidence of urolithiasis or hydronephrosis. Unremarkable unopacified urinary bladder. Stomach/Bowel: No evidence of obstruction, inflammatory process, or abnormal fluid collections. Mild diverticulosis is seen involving the sigmoid colon, however, there is no evidence of diverticulitis. Vascular/Lymphatic: No pathologically enlarged lymph nodes identified. No evidence of  abdominal aortic aneurysm. Aortic atherosclerosis. Reproductive:  No mass or other significant abnormality. Other:  None. Musculoskeletal: No suspicious bone lesions identified. Right hip prosthesis again noted. IMPRESSION: Colonic diverticulosis. No radiographic evidence of diverticulitis or other acute findings within the abdomen or pelvis. New patchy areas of airspace disease in both lung bases, consistent with infection, and suspicious for COVID-19 viral pneumonia. Electronically Signed   By: Marlaine Hind M.D.   On: 03/11/2019 18:21   Dg Chest Port 1 View  Result Date: 03/13/2019 CLINICAL DATA:  Shortness of breath EXAM: PORTABLE CHEST 1 VIEW COMPARISON:  03/10/2019 FINDINGS: The heart size and mediastinal contours are within normal limits. Both lungs are clear. The visualized  skeletal structures are unremarkable. IMPRESSION: No acute abnormality of the lungs in AP portable projection. Electronically Signed   By: Eddie Candle M.D.   On: 03/13/2019 09:48   Dg Chest Port 1 View  Result Date: 03/10/2019 CLINICAL DATA:  Shortness of breath and chest pain for 1 hour. Positive COVID-19 test 6 days ago. EXAM: PORTABLE CHEST 1 VIEW COMPARISON:  03/03/2019 FINDINGS: Low lung volumes are present, causing crowding of the pulmonary vasculature. Hazy perihilar densities probably reflection of the vascular crowding. Linear subsegmental atelectasis at the left lung base. Otherwise no overt airspace opacity is identified. Cardiac and mediastinal margins appear normal. No blunting of the costophrenic angles. IMPRESSION: 1. Low lung volumes are present, causing crowding of the pulmonary vasculature. 2. Minimal subsegmental atelectasis at the left lung base. Electronically Signed   By: Van Clines M.D.   On: 03/10/2019 21:10   Dg Chest Port 1 View  Result Date: 03/03/2019 CLINICAL DATA:  Shortness of breath, wife is COVID-19 positive and hospitalized, patient tested negative on Friday, increased shortness of  breath with exertion, oxygen desaturation EXAM: PORTABLE CHEST 1 VIEW COMPARISON:  Portable exam 1752 hours compared to 04/09/2017 FINDINGS: Normal heart size, mediastinal contours, and pulmonary vascularity. Question mild hazy infiltrate or atelectasis at LEFT base. Remaining lungs clear. No pleural effusion or pneumothorax. Prior cervical spine fusion. No acute osseous findings. IMPRESSION: Question minimal hazy infiltrate or atelectasis at LEFT base. Electronically Signed   By: Lavonia Dana M.D.   On: 03/03/2019 18:24    Microbiology: Recent Results (from the past 240 hour(s))  Blood Culture (routine x 2)     Status: None (Preliminary result)   Collection Time: 03/10/19  9:02 PM   Specimen: Right Antecubital; Blood  Result Value Ref Range Status   Specimen Description RIGHT ANTECUBITAL  Final   Special Requests   Final    BOTTLES DRAWN AEROBIC AND ANAEROBIC Blood Culture adequate volume   Culture   Final    NO GROWTH 4 DAYS Performed at [facility], [address], [place], [place] [address]    Report Status PENDING  Incomplete  Blood Culture (routine x 2)     Status: None (Preliminary result)   Collection Time: 03/10/19  9:03 PM   Specimen: Left Antecubital; Blood  Result Value Ref Range Status   Specimen Description LEFT ANTECUBITAL  Final   Special Requests   Final    BOTTLES DRAWN AEROBIC AND ANAEROBIC Blood Culture adequate volume   Culture   Final    NO GROWTH 4 DAYS Performed at [facility], [address] [place], [place] [address]    Report Status PENDING  Incomplete     Labs: Basic Metabolic Panel: Recent Labs  Lab 03/10/19 2102 03/11/19 0847 03/12/19 0012 03/13/19 0150 03/14/19 0557  NA 134* 136 136 136 137  K 3.2* 3.4* 4.5 4.5 3.9  CL 101 106 104 103 102  CO2 19* 20* 21* 22 23  GLUCOSE 139* 167* 146* 171* 140*  BUN 21* 20 13 18  21*  CREATININE 1.29* 1.09 0.99 1.13 0.95  CALCIUM 8.9 8.7* 8.6* 8.6* 8.7*  MG  --  2.2 2.2 2.2 2.3  PHOS  --  3.3 3.3 4.0 4.3    Liver Function Tests: Recent Labs  Lab 03/10/19 2102 03/11/19 0847 03/12/19 0012 03/13/19 0150 03/14/19 0557  AST 44* 34 25 26 32  ALT 69* 55* 46* 47* 73*  ALKPHOS 74 69 66 59 65  BILITOT 0.8 0.9 0.4 0.6 0.7  PROT 7.9 7.1 6.7 6.4* 6.5  ALBUMIN 4.4 4.0 3.5 3.6 3.6   No results for input(s): LIPASE, AMYLASE in the last 168 hours. No results for input(s): AMMONIA in the last 168 hours. CBC: Recent Labs  Lab 03/10/19 2102 03/11/19 0847 03/12/19 0012 03/13/19 0150 03/14/19 0557  WBC 11.4* 10.1 7.3 7.2 7.9  NEUTROABS 9.4* 8.1* 5.7 5.6 5.8  HGB 17.2* 16.0 14.6 14.2 14.5  HCT 49.8 46.3 43.4 41.4 42.1  MCV 92.2 93.3 94.6 93.9 92.5  PLT 209 212 203 208 231   Cardiac Enzymes: No results for input(s): CKTOTAL, CKMB, CKMBINDEX, TROPONINI in the last 168 hours. BNP: BNP (last 3 results) No results for input(s): BNP in the last 8760 hours.  ProBNP (last 3 results) No results for input(s): PROBNP in the last 8760 hours.  CBG: No results for input(s): GLUCAP in the last 168 hours.     Signed:  Fayrene Helper MD.  [facility] 03/14/2019, 6:26 PM

## 2019-03-15 LAB — CULTURE, BLOOD (ROUTINE X 2)
Culture: NO GROWTH
Culture: NO GROWTH
Special Requests: ADEQUATE
Special Requests: ADEQUATE

## 2019-03-18 ENCOUNTER — Encounter (HOSPITAL_COMMUNITY): Payer: BC Managed Care – PPO

## 2019-03-20 ENCOUNTER — Encounter (HOSPITAL_COMMUNITY): Payer: BC Managed Care – PPO | Admitting: Physical Therapy

## 2019-03-24 ENCOUNTER — Encounter (HOSPITAL_COMMUNITY): Payer: BC Managed Care – PPO | Admitting: Physical Therapy

## 2019-03-26 ENCOUNTER — Encounter (HOSPITAL_COMMUNITY): Payer: BC Managed Care – PPO

## 2019-03-31 ENCOUNTER — Ambulatory Visit (HOSPITAL_COMMUNITY): Payer: BC Managed Care – PPO | Admitting: Physical Therapy

## 2019-04-01 ENCOUNTER — Ambulatory Visit (HOSPITAL_COMMUNITY): Payer: BC Managed Care – PPO | Admitting: Physical Therapy

## 2019-04-10 ENCOUNTER — Emergency Department (HOSPITAL_COMMUNITY): Payer: BC Managed Care – PPO

## 2019-04-10 ENCOUNTER — Other Ambulatory Visit: Payer: Self-pay

## 2019-04-10 ENCOUNTER — Encounter (HOSPITAL_COMMUNITY): Payer: Self-pay

## 2019-04-10 ENCOUNTER — Emergency Department (HOSPITAL_COMMUNITY)
Admission: EM | Admit: 2019-04-10 | Discharge: 2019-04-10 | Disposition: A | Discharge status: Home / Self Care | Payer: BC Managed Care – PPO | Attending: Emergency Medicine | Admitting: Emergency Medicine

## 2019-04-10 DIAGNOSIS — Z9861 Coronary angioplasty status: Secondary | ICD-10-CM | POA: Insufficient documentation

## 2019-04-10 DIAGNOSIS — Z79899 Other long term (current) drug therapy: Secondary | ICD-10-CM | POA: Diagnosis not present

## 2019-04-10 DIAGNOSIS — I2510 Atherosclerotic heart disease of native coronary artery without angina pectoris: Secondary | ICD-10-CM | POA: Diagnosis not present

## 2019-04-10 DIAGNOSIS — Z9102 Food additives allergy status: Secondary | ICD-10-CM | POA: Insufficient documentation

## 2019-04-10 DIAGNOSIS — R1032 Left lower quadrant pain: Secondary | ICD-10-CM

## 2019-04-10 DIAGNOSIS — R112 Nausea with vomiting, unspecified: Secondary | ICD-10-CM | POA: Insufficient documentation

## 2019-04-10 DIAGNOSIS — E039 Hypothyroidism, unspecified: Secondary | ICD-10-CM | POA: Insufficient documentation

## 2019-04-10 DIAGNOSIS — Z91013 Allergy to seafood: Secondary | ICD-10-CM | POA: Insufficient documentation

## 2019-04-10 DIAGNOSIS — R197 Diarrhea, unspecified: Secondary | ICD-10-CM | POA: Insufficient documentation

## 2019-04-10 DIAGNOSIS — F17210 Nicotine dependence, cigarettes, uncomplicated: Secondary | ICD-10-CM | POA: Diagnosis not present

## 2019-04-10 DIAGNOSIS — Z8673 Personal history of transient ischemic attack (TIA), and cerebral infarction without residual deficits: Secondary | ICD-10-CM | POA: Insufficient documentation

## 2019-04-10 DIAGNOSIS — I10 Essential (primary) hypertension: Secondary | ICD-10-CM | POA: Insufficient documentation

## 2019-04-10 LAB — CBC WITH DIFFERENTIAL/PLATELET
Abs Immature Granulocytes: 0.12 10*3/uL — ABNORMAL HIGH (ref 0.00–0.07)
Basophils Absolute: 0.1 10*3/uL (ref 0.0–0.1)
Basophils Relative: 1 %
Eosinophils Absolute: 0.4 10*3/uL (ref 0.0–0.5)
Eosinophils Relative: 4 %
HCT: 47 % (ref 39.0–52.0)
Hemoglobin: 16.2 g/dL (ref 13.0–17.0)
Immature Granulocytes: 1 %
Lymphocytes Relative: 28 %
Lymphs Abs: 2.9 10*3/uL (ref 0.7–4.0)
MCH: 32.7 pg (ref 26.0–34.0)
MCHC: 34.5 g/dL (ref 30.0–36.0)
MCV: 94.8 fL (ref 80.0–100.0)
Monocytes Absolute: 0.9 10*3/uL (ref 0.1–1.0)
Monocytes Relative: 9 %
Neutro Abs: 6.2 10*3/uL (ref 1.7–7.7)
Neutrophils Relative %: 57 %
Platelets: 255 10*3/uL (ref 150–400)
RBC: 4.96 MIL/uL (ref 4.22–5.81)
RDW: 13.4 % (ref 11.5–15.5)
WBC: 10.6 10*3/uL — ABNORMAL HIGH (ref 4.0–10.5)
nRBC: 0 % (ref 0.0–0.2)

## 2019-04-10 LAB — COMPREHENSIVE METABOLIC PANEL
ALT: 51 U/L — ABNORMAL HIGH (ref 0–44)
AST: 27 U/L (ref 15–41)
Albumin: 4.6 g/dL (ref 3.5–5.0)
Alkaline Phosphatase: 83 U/L (ref 38–126)
Anion gap: 10 (ref 5–15)
BUN: 10 mg/dL (ref 6–20)
CO2: 22 mmol/L (ref 22–32)
Calcium: 9.2 mg/dL (ref 8.9–10.3)
Chloride: 106 mmol/L (ref 98–111)
Creatinine, Ser: 0.85 mg/dL (ref 0.61–1.24)
GFR calc Af Amer: >60 mL/min (ref >60)
GFR calc non Af Amer: >60 mL/min (ref >60)
Glucose, Bld: 122 mg/dL — ABNORMAL HIGH (ref 70–99)
Potassium: 3.5 mmol/L (ref 3.5–5.1)
Sodium: 138 mmol/L (ref 135–145)
Total Bilirubin: 1.1 mg/dL (ref 0.3–1.2)
Total Protein: 8 g/dL (ref 6.5–8.1)

## 2019-04-10 LAB — URINALYSIS, ROUTINE W REFLEX MICROSCOPIC
Bilirubin Urine: NEGATIVE
Glucose, UA: NEGATIVE mg/dL
Hgb urine dipstick: NEGATIVE
Ketones, ur: NEGATIVE mg/dL
Leukocytes,Ua: NEGATIVE
Nitrite: NEGATIVE
Protein, ur: NEGATIVE mg/dL
Specific Gravity, Urine: 1.017 (ref 1.005–1.030)
pH: 5 (ref 5.0–8.0)

## 2019-04-10 LAB — LIPASE, BLOOD: Lipase: 19 U/L (ref 11–51)

## 2019-04-10 MED ORDER — FAMOTIDINE IN NACL 20-0.9 MG/50ML-% IV SOLN
20.0000 mg | Freq: Once | INTRAVENOUS | Status: AC
  Administered 2019-04-10: 20 mg via INTRAVENOUS
  Filled 2019-04-10: qty 50

## 2019-04-10 MED ORDER — ONDANSETRON HCL 4 MG/2ML IJ SOLN
4.0000 mg | Freq: Once | INTRAMUSCULAR | Status: AC
  Administered 2019-04-10: 4 mg via INTRAVENOUS
  Filled 2019-04-10: qty 2

## 2019-04-10 MED ORDER — PROMETHAZINE HCL 25 MG/ML IJ SOLN
25.0000 mg | Freq: Once | INTRAMUSCULAR | Status: AC
  Administered 2019-04-10: 25 mg via INTRAVENOUS
  Filled 2019-04-10: qty 1

## 2019-04-10 MED ORDER — HYDROMORPHONE HCL 1 MG/ML IJ SOLN
1.0000 mg | Freq: Once | INTRAMUSCULAR | Status: AC
  Administered 2019-04-10: 09:00:00 1 mg via INTRAVENOUS
  Filled 2019-04-10: qty 1

## 2019-04-10 MED ORDER — SODIUM CHLORIDE 0.9 % IV BOLUS
1000.0000 mL | Freq: Once | INTRAVENOUS | Status: AC
  Administered 2019-04-10: 08:00:00 1000 mL via INTRAVENOUS

## 2019-04-10 MED ORDER — ONDANSETRON 4 MG PO TBDP: ORAL_TABLET | ORAL | 0 refills | Status: DC

## 2019-04-10 MED ORDER — AMOXICILLIN-POT CLAVULANATE 875-125 MG PO TABS: 1.0000 | ORAL_TABLET | Freq: Two times a day (BID) | ORAL | 0 refills | Status: DC

## 2019-04-10 MED ORDER — AMOXICILLIN-POT CLAVULANATE 875-125 MG PO TABS
1.0000 | ORAL_TABLET | Freq: Once | ORAL | Status: AC
  Administered 2019-04-10: 1 via ORAL
  Filled 2019-04-10: qty 1

## 2019-04-10 MED ORDER — HYDROMORPHONE HCL 1 MG/ML IJ SOLN
1.0000 mg | Freq: Once | INTRAMUSCULAR | Status: AC
  Administered 2019-04-10: 1 mg via INTRAVENOUS
  Filled 2019-04-10: qty 1

## 2019-04-10 MED ORDER — PROMETHAZINE HCL 25 MG PO TABS: 25.0000 mg | ORAL_TABLET | Freq: Four times a day (QID) | ORAL | 0 refills | Status: DC | PRN

## 2019-04-10 NOTE — ED Triage Notes (Signed)
Pt arrived with complaints of abdominal pain that started yesterday after eating. Pt has a history of diverticulitis, stating it feels similar. Reports a few weeks ago testing positive for Covid-19 and was told he had possible colitis. Pt reporting nausea and vomiting, states he has tried Zantac, Mylanta, Rolaids, Gas-x.

## 2019-04-10 NOTE — ED Notes (Signed)
Patient transported to CT 

## 2019-04-10 NOTE — ED Provider Notes (Signed)
[facility] DEPT Provider Note   CSN: [id number] Arrival date & time: 04/10/19  0551     History   Chief Complaint Chief Complaint  Patient presents with  . Abdominal Pain    HPI Andrew Love is a 53 y.o. male.     Andrew Love is a 53 y.o. male with a history of hypertension, chronic pain, CAD, diverticulitis and previous partial colectomy, who presents to the ED for evaluation of left-sided abdominal pain.  He reports he has had some intermittent pains in this area over the past 5 days but last night at 830 after eating he developed severe left-sided abdominal pain present in both the upper and lower quadrants.  He reports pain is constant and has been worsening overnight.  He reports multiple episodes of vomiting, with last episode of vomiting he noted a few small blood streaks but no large amounts of bloody emesis.  He has had some loose stools stool.  Reports that he has tried Zantac, Mylanta, Rolaids and Gas-X for his symptoms without any improvement.  Reports this feels similar to previous episodes of diverticulitis.  He had partial colectomy due to recurrent diverticulitis previously.  Patient reports chills but no associated fevers.  No chest pain, shortness of breath or cough.  No dysuria or urinary frequency.  No recent abdominal surgeries.  No other aggravating or alleviating factors.     Past Medical History:  Diagnosis Date  . Arthritis   . Back pain    chronic  . Bronchitis    history of  . CAD (coronary artery disease)   . Chronic back pain   . Chronic insomnia    Per medical history form dated 06/13/10.  . Chronic left hip pain   . Colitis    Per medical history from dated 06/13/10.  . Diverticulitis    Hx of; requiring 3 admissions  . Elevated liver enzymes   . Hypertension   . Hypothyroidism   . Left leg pain    chronic  . 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 Psoriasis    Per medical history  form dated 06/13/10.  . Sigmoid colon ulcer    Rectal polyps  . Sleep apnea with use of continuous positive airway pressure (CPAP)     Patient Active Problem List   Diagnosis Date Noted  . Diarrhea due to COVID-19 03/11/2019  . Vomiting 03/10/2019  . COVID-19 virus infection 03/10/2019  . Hypokalemia 03/10/2019  . AKI (acute kidney injury) ([place]) 03/10/2019  . Metabolic acidosis 99991111  . Lumbar radiculopathy 01/26/2019  . LUQ pain 06/19/2016  . Symptomatic cholelithiasis 08/30/2015  . Preoperative cardiovascular examination   . Coronary artery disease involving native coronary artery of native heart without angina pectoris   . Gastroenteritis 08/26/2015  . Cholelithiases 08/24/2015  . Abdominal pain 08/24/2015  . Cholecystitis, acute 08/24/2015  . Diarrhea 08/24/2015  . Acute cholecystitis 08/24/2015  . Nausea without vomiting 04/08/2015  . Diverticulitis of colon   . Cerebral thrombosis with cerebral infarction ([place]) 03/21/2014  . Stroke ([place]) 03/21/2014  . Essential hypertension 03/21/2014  . TIA (transient ischemic attack)   . HLD (hyperlipidemia)   . Thyroid activity decreased   . Colitis 11/20/2012  . CAD (coronary artery disease)   . Hip pain 08/04/2011  . 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 LIVER FUNCTION TESTS, ABNORMAL, HX OF 12/17/2008    Past Surgical History:  Procedure Laterality Date  . APPENDECTOMY  8th grade  . BACK SURGERY    . CARDIAC CATHETERIZATION  2009  . CARDIAC CATHETERIZATION     Per medical history from dated 06/13/10.  . CERVICAL SPINE SURGERY     C4, C5, C6 spinal fusion  . CHOLECYSTECTOMY N/A 08/31/2015   Procedure: LAPAROSCOPIC CHOLECYSTECTOMY WITH INTRAOPERATIVE CHOLANGIOGRAM;  Surgeon: Excell Seltzer, MD;  Location: [facility];  Service: General;  Laterality: N/A;  . COLONOSCOPY  07/2008   Colitis,NSAID v. Ischemia,malrotation of the gut,Diverticulosis(L),hyperplastic  . COLONOSCOPY N/A 12/13/2012   Dr.  Oneida Alar: Normal TI, mild sigmoid diverticulosis, hemorrhoids, 2 polyps (tubular adenoma). Random colon bx negative. Next TCS 12/2022 with Fentanyl/phenergan  . ESOPHAGOGASTRODUODENOSCOPY N/A 04/16/2014   RMR: Erosive reflux esophagitis. Non critical Schzki's ring not manipulated. Small hiatal hernia. Abnormal gastirc mucosa of doubtful signigicance status post biopsy. I suspect trivial upper GI bleed. Recent abdominal pain presumably secondary to a protracted bout of diverticulitis. CT scan November 23 revealed improvemetn without complication. He finished his antibiotics 2 days age. Left sided ab  . JOINT REPLACEMENT    . LAPAROSCOPIC SIGMOID COLECTOMY  2012   Dr. Fanny Skates: recurrent sigmoid diverticulitis  . LAPAROSCOPIC SIGMOID COLECTOMY  2012   recurernt sigmoid diverticulitis, Dr. Dalbert Batman   . ROTATOR CUFF REPAIR     Left - per medical history form dated 06/13/10.  Marland Kitchen SHOULDER SURGERY     Left  . THYROIDECTOMY     Per medical history form dated 06/13/10.  Marland Kitchen TOTAL HIP ARTHROPLASTY  07/04/2011   Procedure: TOTAL HIP ARTHROPLASTY;  Surgeon: Johnny Bridge, MD;  Location: [facility];  Service: Orthopedics;  Laterality: Right;        Home Medications    Prior to Admission medications   Medication Sig Start Date End Date Taking? Authorizing Provider  acetaminophen (TYLENOL) 500 MG tablet Take 500-1,000 mg by mouth every 6 (six) hours as needed for mild pain or fever.   Yes [provider]  benzonatate (TESSALON) 100 MG capsule Take 1 capsule (100 mg total) by mouth every 8 (eight) hours. Patient taking differently: Take 100 mg by mouth every 8 (eight) hours as needed for cough.  02/26/19  Yes Wurst, [place], PA-C  cetirizine (ZYRTEC) 10 MG chewable tablet Chew 1 tablet (10 mg total) by mouth daily. Patient taking differently: Chew 10 mg by mouth daily as needed for allergies.  02/26/19  Yes Wurst, [place], PA-C  EPINEPHrine (EPIPEN) 0.3 mg/0.3 mL SOAJ Inject 0.3 mLs (0.3 mg total)  into the muscle as needed. Patient taking differently: Inject 0.3 mg into the muscle daily as needed (allergic reaction).  12/01/12  Yes Teressa Lower, MD  levothyroxine (SYNTHROID, LEVOTHROID) 300 MCG tablet Take 300 mcg by mouth every morning.  03/26/14  Yes [provider]  olmesartan (BENICAR) 20 MG tablet Take 20 mg by mouth daily.    Yes [provider]  ondansetron (ZOFRAN) 4 MG tablet Take 1 tablet (4 mg total) by mouth every 6 (six) hours. Patient taking differently: Take 4 mg by mouth every 6 (six) hours as needed for nausea or vomiting.  01/07/19  Yes Wurst, [place], PA-C  oxyCODONE-acetaminophen (PERCOCET) 10-325 MG tablet Take 1 tablet by mouth every 6 (six) hours as needed for pain. 04/01/19  Yes [provider]  pantoprazole (PROTONIX) 40 MG tablet TAKE 1 TABLET BY MOUTH ONCE DAILY Patient taking differently: Take 40 mg by mouth daily as needed (indigestion).  03/06/18  Yes Annitta Needs, NP  zolpidem (AMBIEN) 10 MG tablet Take  10 mg by mouth at bedtime as needed for sleep.    Yes [provider]  amoxicillin-clavulanate (AUGMENTIN) 875-125 MG tablet Take 1 tablet by mouth 2 (two) times daily. One po bid x 7 days 04/10/19   Jacqlyn Larsen, PA-C  fluticasone [facility]) 50 MCG/ACT nasal spray Place 2 sprays into both nostrils daily. Patient not taking: Reported on 04/10/2019 02/26/19   Wurst, [place], PA-C  meclizine (ANTIVERT) 25 MG tablet Take 1 tablet (25 mg total) by mouth 3 (three) times daily as needed for dizziness. Patient not taking: Reported on 04/10/2019 04/09/17   Isla Pence, MD  ondansetron [facility] ODT) 4 MG disintegrating tablet 4mg  ODT q4 hours prn nausea/vomit 04/10/19   Jacqlyn Larsen, PA-C  promethazine (PHENERGAN) 25 MG tablet Take 1 tablet (25 mg total) by mouth every 6 (six) hours as needed for nausea or vomiting. 04/10/19   Jacqlyn Larsen, PA-C    Family History Family History  Problem Relation Age of Onset  . Cancer Mother         breast cancer - per medical history form dated 06/13/10.  . Diverticulitis Mother   . Hypertension Mother   . Cancer Father        skin - per medical history form dated 06/13/10.  Marland Kitchen Hypertension Father   . Anesthesia problems Neg Hx   . Hypotension Neg Hx   . Malignant hyperthermia Neg Hx   . Pseudochol deficiency Neg Hx   . Colon cancer Neg Hx     Social History Social History   Tobacco Use  . Smoking status: Current Every Day Smoker    Packs/day: 0.50    Years: 18.00    Pack years: 9.00    Types: Cigarettes  . Smokeless tobacco: Never Used  . Tobacco comment:    Substance Use Topics  . Alcohol use: No    Alcohol/week: 0.0 standard drinks  . Drug use: No     Allergies   Other, Iohexol, Shrimp [shellfish allergy], Wheat bran, and Yeast-related products   Review of Systems Review of Systems  Constitutional: Positive for chills. Negative for fever.  HENT: Negative.   Respiratory: Negative for cough and shortness of breath.   Cardiovascular: Negative for chest pain.  Gastrointestinal: Positive for abdominal pain, diarrhea, nausea and vomiting.  Genitourinary: Negative for dysuria, flank pain and frequency.  Musculoskeletal: Negative for arthralgias and myalgias.  Skin: Negative for color change and rash.  Neurological: Negative for dizziness, syncope and light-headedness.  All other systems reviewed and are negative.    Physical Exam Updated Vital Signs BP (!) 152/100 (BP Location: Left Arm)   Pulse 88   Temp 97.7 F (36.5 C) (Oral)   Resp 16   Ht 6' (1.829 m)   Wt 104.3 kg   SpO2 98%   BMI 31.19 kg/m   Physical Exam Vitals signs and nursing note reviewed.  Constitutional:      General: He is not in acute distress.    Appearance: He is well-developed. He is not diaphoretic.     Comments: Patient appears uncomfortable but is not in acute distress.  HENT:     Head: Normocephalic and atraumatic.     Mouth/Throat:     Mouth: Mucous membranes are moist.      Pharynx: Oropharynx is clear.  Eyes:     General:        Right eye: No discharge.        Left eye: No discharge.  Pupils: Pupils are equal, round, and reactive to light.  Neck:     Musculoskeletal: Neck supple.  Cardiovascular:     Rate and Rhythm: Normal rate and regular rhythm.     Heart sounds: Normal heart sounds.  Pulmonary:     Effort: Pulmonary effort is normal. No respiratory distress.     Breath sounds: Normal breath sounds. No wheezing or rales.     Comments: Respirations equal and unlabored, patient able to speak in full sentences, lungs clear to auscultation bilaterally Abdominal:     General: Bowel sounds are normal. There is no distension.     Palpations: Abdomen is soft. There is no mass.     Tenderness: There is abdominal tenderness in the left upper quadrant and left lower quadrant. There is no guarding.     Comments: Abdomen is soft and nondistended, bowel sounds are present throughout, patient has left upper and lower quadrant tenderness on exam without peritonitis.  Musculoskeletal:        General: No deformity.  Skin:    General: Skin is warm and dry.     Capillary Refill: Capillary refill takes less than 2 seconds.  Neurological:     Mental Status: He is alert.     Coordination: Coordination normal.     Comments: Speech is clear, able to follow commands Moves extremities without ataxia, coordination intact  Psychiatric:        Mood and Affect: Mood normal.        Behavior: Behavior normal.      ED Treatments / Results  Labs (all labs ordered are listed, but only abnormal results are displayed) Labs Reviewed  COMPREHENSIVE METABOLIC PANEL - Abnormal; Notable for the following components:      Result Value   Glucose, Bld 122 (*)    ALT 51 (*)    All other components within normal limits  CBC WITH DIFFERENTIAL/PLATELET - Abnormal; Notable for the following components:   WBC 10.6 (*)    Abs Immature Granulocytes 0.12 (*)    All other  components within normal limits  LIPASE, BLOOD  URINALYSIS, ROUTINE W REFLEX MICROSCOPIC    EKG None  Radiology Ct Abdomen Pelvis Wo Contrast  Result Date: 04/10/2019 CLINICAL DATA:  Abdominal pain for 2 days EXAM: CT ABDOMEN AND PELVIS WITHOUT CONTRAST TECHNIQUE: Multidetector CT imaging of the abdomen and pelvis was performed following the standard protocol without IV contrast. COMPARISON:  03/11/2019 FINDINGS: Lower chest: Persistent but improved interstitial thickening and patchy ground-glass opacities at the visualized lung bases. Hepatobiliary: No focal liver lesion on this noncontrast study. Post cholecystectomy. No biliary dilatation. Pancreas: Unremarkable. Spleen: Unremarkable. Adrenals/Urinary Tract: Adrenals are unremarkable. Kidneys are normal in appearance. Bladder is unremarkable. Stomach/Bowel: Stomach is unremarkable. The cecum is in the left upper quadrant. Appendix is not visualized. Vascular/Lymphatic: Aortic atherosclerosis. No enlarged abdominal or pelvic lymph nodes. Reproductive: Prostate is unremarkable. Other: No ascites or free air. Musculoskeletal: Partially imaged right total hip arthroplasty and associated streak artifact. IMPRESSION: No findings to account for reported symptoms. Persistent but improved opacities at the lung bases, which may be related to history of COVID-19 infection. Electronically Signed   By: Macy Mis M.D.   On: 04/10/2019 08:34    Procedures Procedures (including critical care time)  Medications Ordered in ED Medications  sodium chloride 0.9 % bolus 1,000 mL (1,000 mLs Intravenous Bolus 04/10/19 0747)  ondansetron (ZOFRAN) injection 4 mg (4 mg Intravenous Given 04/10/19 0746)  HYDROmorphone (DILAUDID) injection 1 mg (1  mg Intravenous Given 04/10/19 0746)  HYDROmorphone (DILAUDID) injection 1 mg (1 mg Intravenous Given 04/10/19 0843)  ondansetron (ZOFRAN) injection 4 mg (4 mg Intravenous Given 04/10/19 0842)  famotidine (PEPCID) IVPB 20 mg  premix (0 mg Intravenous Stopped 04/10/19 0913)  promethazine (PHENERGAN) injection 25 mg (25 mg Intravenous Given 04/10/19 0957)  amoxicillin-clavulanate (AUGMENTIN) 875-125 MG per tablet 1 tablet (1 tablet Oral Given 04/10/19 1044)     Initial Impression / Assessment and Plan / ED Course  I have reviewed the triage vital signs and the nursing notes.  Pertinent labs & imaging results that were available during my care of the patient were reviewed by me and considered in my medical decision making (see chart for details).  53 year old male presents with left-sided abdominal pain that has been intermittent over the past week but significantly worsened last night at 8:30, he reports of multiple episodes of persistent vomiting and severe nausea.  He has had some occasional diarrhea, no blood in the stools.  No associated chest pain or fevers, but he has had some chills.  Pain feels similar to previous episodes of diverticulitis, patient has history of previous colectomy from diverticulitis.  He is focally tender in the left upper and lower quadrants.  Concern for diverticulitis, versus colitis versus gastritis or peptic ulcer disease given left upper quadrant pain as well.  Will get basic abdominal labs and CT, patient has contrast allergy, noncontrasted study ordered.  IV fluids pain and nausea medication given.  Labs overall reassuring, mild leukocytosis, no significant electrolyte derangements, normal renal and liver function normal lipase.  Urinalysis without signs of infection.  CT scan overall reassuring with no focal abnormalities within the abdomen or pelvis, hazy opacities are improving compared to previous study when patient was admitted with Covid pneumonia.  Question whether this may be early diverticulitis without CT evidence of inflammation yet given that it feels similar to previous episodes.  Patient's pain and nausea have improved with treatment here in the ED and he is now tolerating p.o.,  will start patient on Augmentin and sent home with antiemetics, he already has pain medication at home that is prescribed to him.  Encouraged close outpatient follow-up with PCP and strict return precautions.  Patient expresses understanding and agreement.  Discharged home in good condition.  Final Clinical Impressions(s) / ED Diagnoses   Final diagnoses:  Left lower quadrant abdominal pain  Non-intractable vomiting with nausea, unspecified vomiting type    ED Discharge Orders         Ordered    amoxicillin-clavulanate (AUGMENTIN) 875-125 MG tablet  2 times daily     04/10/19 1153    promethazine (PHENERGAN) 25 MG tablet  Every 6 hours PRN     04/10/19 1153    ondansetron (ZOFRAN ODT) 4 MG disintegrating tablet     04/10/19 1153           Benedetto Goad [place], [place] 04/10/19 [place], Ankit, MD 04/11/19 [phone]

## 2019-04-10 NOTE — ED Notes (Signed)
Pt aware urine needed. Pt provided with urinal

## 2019-04-10 NOTE — ED Notes (Signed)
Pt requesting something else for nausea and pain. PA aware

## 2019-04-10 NOTE — Discharge Instructions (Addendum)
Take antibiotics twice daily for the next week for possible early diverticulitis.  Use your home pain medication as needed.  Use Zofran for nausea if you are continuing to have vomiting you can try Phenergan.  Please follow-up with your primary care doctor in the next few days for recheck.  If you are having worsening abdominal pain, persistent vomiting, fevers, blood in your stool or any other new or concerning symptoms return to the ED.

## 2019-04-18 ENCOUNTER — Encounter: Payer: Self-pay | Admitting: Gastroenterology

## 2019-04-18 ENCOUNTER — Ambulatory Visit: Payer: BC Managed Care – PPO | Admitting: Gastroenterology

## 2019-04-18 ENCOUNTER — Other Ambulatory Visit: Payer: Self-pay

## 2019-04-18 VITALS — BP 123/86 | HR 63 | Temp 96.9°F | Ht 72.0 in | Wt 234.4 lb

## 2019-04-18 DIAGNOSIS — R748 Abnormal levels of other serum enzymes: Secondary | ICD-10-CM

## 2019-04-18 DIAGNOSIS — R109 Unspecified abdominal pain: Secondary | ICD-10-CM | POA: Insufficient documentation

## 2019-04-18 DIAGNOSIS — R197 Diarrhea, unspecified: Secondary | ICD-10-CM | POA: Diagnosis not present

## 2019-04-18 MED ORDER — DEXILANT 60 MG PO CPDR: 60.0000 mg | DELAYED_RELEASE_CAPSULE | Freq: Every day | ORAL | 11 refills | Status: DC

## 2019-04-18 MED ORDER — DICYCLOMINE HCL 10 MG PO CAPS: 10.0000 mg | ORAL_CAPSULE | Freq: Three times a day (TID) | ORAL | 3 refills | Status: DC

## 2019-04-18 NOTE — Patient Instructions (Signed)
1. Stop pantoprazole. 2. Start Dexilant 60 mg daily before breakfast. 3. Start dicyclomine 10 mg before meals and at bedtime up to 4 times daily for abdominal cramping and diarrhea.  Hold for constipation. 4. Go for blood work in the next couple of weeks. 5. Call if your symptoms do not settle down or worsen.  In the meantime I will be reviewing prior records as discussed.

## 2019-04-18 NOTE — Progress Notes (Signed)
Primary Care Physician: Redmond School, MD  Primary Gastroenterologist:  Barney Drain, MD   Chief Complaint  Patient presents with  . Abdominal Pain    started after hospitalization from covid in November. LLQ pain, comes/goes mostly late afternoon  . Nausea    with vomiting  . diarrhea/constipation    HPI: Andrew Love is a 53 y.o. male here for further evaluation of abdominal pain, nausea and vomiting, diarrhea, history of elevated LFTs.  Patient last seen in April 2018.  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  History of chronic diarrhea in the past.  His last colonoscopy 2014 with tubular adenomas, plan for repeat colonoscopy in 10 years with fentanyl/Phenergan..  Last EGD in December 2015 with erosive reflux esophagitis, noncritical Schatzki ring not manipulated.  Abnormal gastric mucosa, biopsy showed reactive gastropathy but no H. pylori.   Patient states he was doing well from a GI standpoint until he contracted COVID-19. Tested positive March 03, 2019.  He was hospitalized from November 2 through November 6.  He developed vomiting and diarrhea along with shortness of breath/pneumonia.  He was treated with steroids and remdesivir.  During admission his diarrhea resolved.  GI consult was requested during admission, Dr. Paulita Fujita all reviewed the chart and felt like his symptoms were likely acute on chronic suspect component of chronic abdominal pain with superimposed infectious component (Covid or otherwise).  CT abdomen pelvis without contrast on November 3 showed findings of COVID-19 viral pneumonia, colonic diverticulosis but no evidence of diverticulitis.   Patient presents today stating he developed left-sided abdominal pain the second day of his hospitalization along with diarrhea.  Symptoms did improve with regards  to diarrhea prior to discharge.  Dates he was at the beach earlier this month and developed significant acid reflux and vomiting that occurred acutely after eating chicken wings at a restaurant.  Patient states typically he does not get sauce because he generally does not tolerate.  This time there was sauce on it inadvertently and he chose to go ahead and eat it.  He was afraid he is going get stuck at the beach by himself so he drove back home and went straight to the emergency department.  He had a CT abdomen pelvis without contrast on 04/10/2019 showing persistent but improved opacities at the lung bases, no acute findings otherwise.  Urinalysis was negative.  White blood cell count 10,600, hemoglobin 16.2, lipase normal, LFTs normal except for ALT of 51, glucose 122, creatinine 0.85.  States the ED provider gave him a course of Augmentin for possible acute diverticulitis maybe not picked up on CT.  Patient states he still gets some left-sided abdominal pain especially if he has to have a bowel movement.  Sometimes feels like stools not flowing through the left side of his abdomen, hard to have a bowel movement but stool is soft.  No melena or rectal bleeding.  Vomiting resolved.  Also has back pain and left hip pain which is different than this particular abdominal pain.  Takes oxycodone as needed for his back but does not take on a regular basis.  Has arthritis in his left hip and needs replacement.  Feels like pantoprazole twice daily is not controlling his reflux.  Hospitalized 3 days after extraforaminal discectomy.  MRI lumbar spine showed no evidence of recurrent disc, a lot of epidural  fibrosis enhancement along the nerve root and thecal sac.  Patient was placed on IV steroids.  Pain settled into his hip.  MRI showed severe osteoarthritis.        Current Outpatient Medications  Medication Sig Dispense Refill  . acetaminophen (TYLENOL) 500 MG tablet Take 500-1,000 mg by mouth every 6 (six) hours as  needed for mild pain or fever.    Marland Kitchen amoxicillin-clavulanate (AUGMENTIN) 875-125 MG tablet Take 1 tablet by mouth 2 (two) times daily. One po bid x 7 days 14 tablet 0  . benzonatate (TESSALON) 100 MG capsule Take 1 capsule (100 mg total) by mouth every 8 (eight) hours. (Patient taking differently: Take 100 mg by mouth every 8 (eight) hours as needed for cough. ) 21 capsule 0  . cetirizine (ZYRTEC) 10 MG chewable tablet Chew 1 tablet (10 mg total) by mouth daily. (Patient taking differently: Chew 10 mg by mouth daily as needed for allergies. ) 20 tablet 0  . EPINEPHrine (EPIPEN) 0.3 mg/0.3 mL SOAJ Inject 0.3 mLs (0.3 mg total) into the muscle as needed. (Patient taking differently: Inject 0.3 mg into the muscle daily as needed (allergic reaction). ) 1 Device 3  . fluticasone (FLONASE) 50 MCG/ACT nasal spray Place 2 sprays into both nostrils daily. (Patient taking differently: Place 2 sprays into both nostrils as needed. ) 16 g 0  . levothyroxine (SYNTHROID, LEVOTHROID) 300 MCG tablet Take 300 mcg by mouth every morning.     . meclizine (ANTIVERT) 25 MG tablet Take 1 tablet (25 mg total) by mouth 3 (three) times daily as needed for dizziness. 30 tablet 0  . olmesartan (BENICAR) 20 MG tablet Take 20 mg by mouth daily.     . ondansetron (ZOFRAN ODT) 4 MG disintegrating tablet 4mg  ODT q4 hours prn nausea/vomit 10 tablet 0  . ondansetron (ZOFRAN) 4 MG tablet Take 1 tablet (4 mg total) by mouth every 6 (six) hours. (Patient taking differently: Take 4 mg by mouth every 6 (six) hours as needed for nausea or vomiting. ) 12 tablet 0  . oxyCODONE-acetaminophen (PERCOCET) 10-325 MG tablet Take 1 tablet by mouth every 6 (six) hours as needed for pain.    . pantoprazole (PROTONIX) 40 MG tablet TAKE 1 TABLET BY MOUTH ONCE DAILY (Patient taking differently: Take 40 mg by mouth daily as needed (indigestion). ) 30 tablet 11  . promethazine (PHENERGAN) 25 MG tablet Take 1 tablet (25 mg total) by mouth every 6 (six) hours as  needed for nausea or vomiting. 10 tablet 0  . zolpidem (AMBIEN) 10 MG tablet Take 10 mg by mouth at bedtime as needed for sleep.      No current facility-administered medications for this visit.    Allergies as of 04/18/2019 - Review Complete 04/18/2019  Allergen Reaction Noted  . Other Hives and Shortness Of Breath 01/08/2013  . Iohexol Hives 03/31/2014  . Shrimp [shellfish allergy] Hives 06/19/2016  . Wheat bran Hives 06/19/2016  . Yeast-related products Hives 07/04/2016   Past Medical History:  Diagnosis Date  . Arthritis   . Back pain    chronic  . Bronchitis    history of  . CAD (coronary artery disease)   . Chronic back pain   . Chronic insomnia    Per medical history form dated 06/13/10.  . Chronic left hip pain   . Colitis    Per medical history from dated 06/13/10.  . Diverticulitis    Hx of; requiring 3 admissions  . Elevated liver enzymes   .  Hypertension   . Hypothyroidism   . Left leg pain    chronic  . 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 Psoriasis    Per medical history form dated 06/13/10.  . Sigmoid colon ulcer    Rectal polyps  . Sleep apnea with use of continuous positive airway pressure (CPAP)    Past Surgical History:  Procedure Laterality Date  . APPENDECTOMY     8th grade  . BACK SURGERY    . CARDIAC CATHETERIZATION  2009  . CARDIAC CATHETERIZATION     Per medical history from dated 06/13/10.  . CERVICAL SPINE SURGERY     C4, C5, C6 spinal fusion  . CHOLECYSTECTOMY N/A 08/31/2015   Procedure: LAPAROSCOPIC CHOLECYSTECTOMY WITH INTRAOPERATIVE CHOLANGIOGRAM;  Surgeon: Excell Seltzer, MD;  Location: [facility];  Service: General;  Laterality: N/A;  . COLONOSCOPY  07/2008   Colitis,NSAID v. Ischemia,malrotation of the gut,Diverticulosis(L),hyperplastic  . COLONOSCOPY N/A 12/13/2012   Dr. Oneida Alar: Normal TI, mild sigmoid diverticulosis, hemorrhoids, 2 polyps (tubular adenoma). Random colon bx negative. Next TCS 12/2022 with  Fentanyl/phenergan  . ESOPHAGOGASTRODUODENOSCOPY N/A 04/16/2014   RMR: Erosive reflux esophagitis. Non critical Schzki's ring not manipulated. Small hiatal hernia. Abnormal gastirc mucosa of doubtful signigicance status post biopsy. I suspect trivial upper GI bleed. Recent abdominal pain presumably secondary to a protracted bout of diverticulitis. CT scan November 23 revealed improvemetn without complication. He finished his antibiotics 2 days age. Left sided ab  . JOINT REPLACEMENT    . LAPAROSCOPIC SIGMOID COLECTOMY  2012   Dr. Fanny Skates: recurrent sigmoid diverticulitis  . LAPAROSCOPIC SIGMOID COLECTOMY  2012   recurernt sigmoid diverticulitis, Dr. Dalbert Batman   . ROTATOR CUFF REPAIR     Left - per medical history form dated 06/13/10.  Marland Kitchen SHOULDER SURGERY     Left  . THYROIDECTOMY     Per medical history form dated 06/13/10.  Marland Kitchen TOTAL HIP ARTHROPLASTY  07/04/2011   Procedure: TOTAL HIP ARTHROPLASTY;  Surgeon: Johnny Bridge, MD;  Location: [facility];  Service: Orthopedics;  Laterality: Right;    ROS:  General: Negative for anorexia, weight loss, fever, chills, fatigue, weakness. ENT: Negative for hoarseness, difficulty swallowing , nasal congestion. CV: Negative for chest pain, angina, palpitations, dyspnea on exertion, peripheral edema.  Respiratory: Negative for dyspnea at rest, dyspnea on exertion, cough, sputum, wheezing.  GI: See history of present illness. GU:  Negative for dysuria, hematuria, urinary incontinence, urinary frequency, nocturnal urination.  Endo: Negative for unusual weight change.    Physical Examination:   BP 123/86   Pulse 63   Temp (!) 96.9 F (36.1 C) (Oral)   Ht 6' (1.829 m)   Wt 234 lb 6.4 oz (106.3 kg)   BMI 31.79 kg/m   General: Well-nourished, well-developed in no acute distress.  Eyes: No icterus. Mouth: Oropharyngeal mucosa moist and pink , no lesions erythema or exudate. Lungs: Clear to auscultation bilaterally.  Heart: Regular rate and rhythm, no  murmurs rubs or gallops.  Abdomen: Bowel sounds are normal, nondistended, no hepatosplenomegaly or masses, no abdominal bruits or hernia , no rebound or guarding.  Mild llq tenderness. Extremities: No lower extremity edema. No clubbing or deformities. Neuro: Alert and oriented x 4   Skin: Warm and dry, no jaundice.   Psych: Alert and cooperative, normal mood and affect.  Labs:  Lab Results  Component Value Date   CREATININE 0.85 04/10/2019   BUN 10 04/10/2019   NA 138 04/10/2019   K 3.5  04/10/2019   CL 106 04/10/2019   CO2 22 04/10/2019   Lab Results  Component Value Date   WBC 10.6 (H) 04/10/2019   HGB 16.2 04/10/2019   HCT 47.0 04/10/2019   MCV 94.8 04/10/2019   PLT 255 04/10/2019   Lab Results  Component Value Date   ALT 51 (H) 04/10/2019   AST 27 04/10/2019   ALKPHOS 83 04/10/2019   BILITOT 1.1 04/10/2019   Lab Results  Component Value Date   LIPASE 19 04/10/2019   Lab Results  Component Value Date   FERRITIN 1,293 (H) 03/14/2019   Lab Results  Component Value Date   FERRITIN 1,293 (H) 03/14/2019   Lab Results  Component Value Date   CREATININE 0.85 04/10/2019   BUN 10 04/10/2019   NA 138 04/10/2019   K 3.5 04/10/2019   CL 106 04/10/2019   CO2 22 04/10/2019    No results found for: FOLATE  Imaging Studies: CT ABDOMEN PELVIS WO CONTRAST  Result Date: 04/10/2019 CLINICAL DATA:  Abdominal pain for 2 days EXAM: CT ABDOMEN AND PELVIS WITHOUT CONTRAST TECHNIQUE: Multidetector CT imaging of the abdomen and pelvis was performed following the standard protocol without IV contrast. COMPARISON:  03/11/2019 FINDINGS: Lower chest: Persistent but improved interstitial thickening and patchy ground-glass opacities at the visualized lung bases. Hepatobiliary: No focal liver lesion on this noncontrast study. Post cholecystectomy. No biliary dilatation. Pancreas: Unremarkable. Spleen: Unremarkable. Adrenals/Urinary Tract: Adrenals are unremarkable. Kidneys are normal in  appearance. Bladder is unremarkable. Stomach/Bowel: Stomach is unremarkable. The cecum is in the left upper quadrant. Appendix is not visualized. Vascular/Lymphatic: Aortic atherosclerosis. No enlarged abdominal or pelvic lymph nodes. Reproductive: Prostate is unremarkable. Other: No ascites or free air. Musculoskeletal: Partially imaged right total hip arthroplasty and associated streak artifact. IMPRESSION: No findings to account for reported symptoms. Persistent but improved opacities at the lung bases, which may be related to history of COVID-19 infection. Electronically Signed   By: Macy Mis M.D.   On: 04/10/2019 08:34

## 2019-04-21 ENCOUNTER — Encounter: Payer: Self-pay | Admitting: Gastroenterology

## 2019-04-21 DIAGNOSIS — R748 Abnormal levels of other serum enzymes: Secondary | ICD-10-CM | POA: Insufficient documentation

## 2019-04-21 NOTE — Assessment & Plan Note (Signed)
Chronically elevated ALT.  Noncontrast CT of the liver unremarkable.  Ferritin elevated during recent admission for COVID-19.  No baseline ferritin available for comparison.  We will update the next couple of weeks.  If ferritin remains elevated will need to rule out hemochromatosis.

## 2019-04-21 NOTE — Assessment & Plan Note (Signed)
Pleasant 53 year old gentleman with complicated GI history as outlined above presenting with recurrent left-sided abdominal pain with bowel issues.  Had done well for a couple years until he developed COVID-19.  Has had some left-sided abdominal pain associated with diarrhea and vomiting seem to be getting better but with return of acute onset vomiting and left-sided abdominal pain couple of weeks ago.  CT imaging twice, no IV contrast due to allergies, no explanation for symptoms.  Currently on Augmentin empirically for possible diverticulitis per ED provider.  He does note his reflux is poorly controlled which may be contributing to his vomiting.  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  Switch pantoprazole to Dexilant 60 mg daily before breakfast.  Trial of dicyclomine 10 mg before meals and at bedtime as needed for abdominal cramping and diarrhea.  Hold for constipation.  Update labs.  We will review previous CT imaging to determine when malrotated colon first detected.  Further recommendations to follow.

## 2019-05-23 ENCOUNTER — Other Ambulatory Visit: Payer: Self-pay | Admitting: Neurosurgery

## 2019-05-23 DIAGNOSIS — M5136 Other intervertebral disc degeneration, lumbar region: Secondary | ICD-10-CM

## 2019-05-23 DIAGNOSIS — M51369 Other intervertebral disc degeneration, lumbar region without mention of lumbar back pain or lower extremity pain: Secondary | ICD-10-CM

## 2019-05-23 DIAGNOSIS — M542 Cervicalgia: Secondary | ICD-10-CM

## 2019-06-02 ENCOUNTER — Other Ambulatory Visit: Payer: Self-pay

## 2019-06-02 ENCOUNTER — Emergency Department (HOSPITAL_COMMUNITY)
Admission: EM | Admit: 2019-06-02 | Discharge: 2019-06-02 | Disposition: A | Discharge status: Other Acute Inpatient Hospital | Payer: 59

## 2019-06-20 ENCOUNTER — Other Ambulatory Visit: Payer: Self-pay

## 2019-06-20 ENCOUNTER — Ambulatory Visit
Admission: RE | Admit: 2019-06-20 | Discharge: 2019-06-20 | Disposition: A | Discharge status: Home / Self Care | Payer: BC Managed Care – PPO | Source: Ambulatory Visit | Attending: Neurosurgery | Admitting: Neurosurgery

## 2019-06-20 DIAGNOSIS — M51369 Other intervertebral disc degeneration, lumbar region without mention of lumbar back pain or lower extremity pain: Secondary | ICD-10-CM

## 2019-06-20 DIAGNOSIS — M5136 Other intervertebral disc degeneration, lumbar region: Secondary | ICD-10-CM

## 2019-06-20 DIAGNOSIS — M542 Cervicalgia: Secondary | ICD-10-CM

## 2019-06-20 MED ORDER — GADOBENATE DIMEGLUMINE 529 MG/ML IV SOLN
20.0000 mL | Freq: Once | INTRAVENOUS | Status: AC | PRN
  Administered 2019-06-20: 20 mL via INTRAVENOUS

## 2019-07-09 ENCOUNTER — Ambulatory Visit (INDEPENDENT_AMBULATORY_CARE_PROVIDER_SITE_OTHER): Payer: 59 | Admitting: Allergy & Immunology

## 2019-07-09 ENCOUNTER — Encounter: Payer: Self-pay | Admitting: Allergy & Immunology

## 2019-07-09 ENCOUNTER — Other Ambulatory Visit: Payer: Self-pay

## 2019-07-09 VITALS — BP 124/68 | HR 66 | Temp 97.7°F | Resp 18 | Ht 72.5 in | Wt 238.4 lb

## 2019-07-09 DIAGNOSIS — K9049 Malabsorption due to intolerance, not elsewhere classified: Secondary | ICD-10-CM

## 2019-07-09 DIAGNOSIS — J310 Chronic rhinitis: Secondary | ICD-10-CM | POA: Diagnosis not present

## 2019-07-09 DIAGNOSIS — L508 Other urticaria: Secondary | ICD-10-CM | POA: Insufficient documentation

## 2019-07-09 DIAGNOSIS — J3089 Other allergic rhinitis: Secondary | ICD-10-CM

## 2019-07-09 DIAGNOSIS — T8069XA Other serum reaction due to other serum, initial encounter: Secondary | ICD-10-CM | POA: Diagnosis not present

## 2019-07-09 DIAGNOSIS — J302 Other seasonal allergic rhinitis: Secondary | ICD-10-CM

## 2019-07-09 MED ORDER — OMALIZUMAB 150 MG ~~LOC~~ SOLR
300.0000 mg | SUBCUTANEOUS | Status: DC
  Administered 2019-07-09: 12:00:00 300 mg via SUBCUTANEOUS

## 2019-07-09 NOTE — Progress Notes (Signed)
NEW PATIENT  Date of Service/Encounter:  07/09/19  Referring provider: Redmond School, MD   Assessment:   Food intolerance (wheat, almond, oyster) - questionable significance   Chronic urticaria - initiating Xolair today   Chronic rhinitis (grasses, weeds, trees, indoor molds, dust mites, dog and cockroach)  Allergic reaction to vaccine - OK to hold the second vaccination (tolerated bowel prep as well flu vaccinations in the past)  History of diverticulitis and GERD status post partial colonic removal  Plan/Recommendations:   1. Food intolerance - Testing today showed slight reactivities to wheat, oyster, and almond. - You seem to have a good handle on the wheat, but it does make sense with some your symptoms. - I would avoid oysters and be sure to tell any restaurants that you are allergic to oysters so that there is no cross-contamination. - I doubt the almond is relevant given its small size and the fact that almond allergy is exceedingly rare. - EpiPen is up to date. - Anaphylaxis management plan provided.  2. Chronic urticaria - Your history does not have any "red flags" such as fevers, joint pains, or permanent skin changes that would be concerning for a more serious cause of hives.  - There are several possible triggers on testing today, including all of the environmental triggers. - We could do a lab work-up, but again I do not see any red flags in her history. - Chronic hives are often times a self limited process and will "burn themselves out" over 6-12 months, although clearly this is not the case review. - Sample of Xolair provided today. - Consent for Xolair provided today. - There are excellent co-pay programs available and Tammy will reach out to you to discuss the options. - Most people pay $5 per month further Xolair. - At future urticarial outbreaks, add on Zyrtec 1 to 2 tablets up to twice daily to suppress the hives. - Call us if this is not  working.  3. Chronic rhinitis - Testing today showed: grasses, weeds, trees, indoor molds, dust mites, dog and cockroach - Copy of test results provided.  - Avoidance measures provided. - Continue with: as needed antihistamines - I do not think that allergy shots are indicated at this time. - They do not do as much to help with hives. - If you were having more classical allergic rhinitis symptoms (sneezing, watery eyes, runny nose, etc), allergy shots would definitely be the way to go.   4. Allergic reaction to vaccine - I do not think that your reaction was a true allergy.  - Reassuringly, you have tolerated polyethylene glycol, which is thought to be the main allergenic component of the [employer] vaccinations. - I do not think he need a second dose anyway because there is mounting evidence that 1 dose is adequate for people who have had a severe COVID-19 infection.  5. Return in about 4 weeks (around 08/06/2019). This can be an in-person, a virtual Webex or a telephone follow up visit.  Subjective:   Andrew Love is a 54 y.o. male presenting today for evaluation of  Chief Complaint  Patient presents with  . Allergies    [facility] [employer] [facility] has a history of the following: Patient Active Problem List   Diagnosis Date Noted  . Food intolerance 07/09/2019  . Chronic urticaria 07/09/2019  . Seasonal and perennial allergic rhinitis 07/09/2019  . Elevated liver enzymes   . Left sided  abdominal pain 04/18/2019  . Diarrhea due to COVID-19 03/11/2019  . Vomiting 03/10/2019  . COVID-19 virus infection 03/10/2019  . Hypokalemia 03/10/2019  . AKI (acute kidney injury) ([place]) 03/10/2019  . Metabolic acidosis 99991111  . Lumbar radiculopathy 01/26/2019  . LUQ pain 06/19/2016  . Symptomatic cholelithiasis 08/30/2015  . Preoperative cardiovascular examination   . Coronary artery disease involving native coronary artery of native heart without  angina pectoris   . Gastroenteritis 08/26/2015  . Cholelithiases 08/24/2015  . Abdominal pain 08/24/2015  . Cholecystitis, acute 08/24/2015  . Diarrhea 08/24/2015  . Acute cholecystitis 08/24/2015  . Nausea without vomiting 04/08/2015  . Diverticulitis of colon   . Cerebral thrombosis with cerebral infarction ([place]) 03/21/2014  . Stroke ([place]) 03/21/2014  . Essential hypertension 03/21/2014  . TIA (transient ischemic attack)   . HLD (hyperlipidemia)   . Thyroid activity decreased   . Colitis 11/20/2012  . CAD (coronary artery disease)   . Hip pain 08/04/2011  . 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 LIVER FUNCTION TESTS, ABNORMAL, HX OF 12/17/2008    History obtained from: chart review and patient.  Andrew Love was referred by Redmond School, MD.     Bora is a 54 y.o. male presenting for an evaluation of allergic reactions.    Allergic Rhinitis Symptom History: He reports that he has had environmental alleriges for years. He has episodes of hives when he is outside working in the yard. He is active in golfing and has issues there. It feels that something bites his neck and it spreads rapidly. He reports breaking out in hives. He tells me that he has issues in the winter as well, especially when he steps into wet leaves.   Food Allergy Symptom History: He reports that he has problems everytime that he eats a bred product. Andrew Love is his worst reactions. Lately he hahas had several in the last couple of weeks. These mostly are hives. He had throat involvement when he ate [employer] pizza. He did use his EpiPen. He went to the hospital after both of these episodes. The only pizza that he can eat is [employer] in [place].   He does have a history of reflux and diverticulitis. He has not had a colonoscopy lately. He is on Dexilant and has never needed esophageal stretching. He has Zofran that he uses for vomiting. He didav he a foot of his colon removed. This was  years ago in 2012. This was from this diverticulitis.   He also has a history of bone degeneration.  He has had 1 back surgery and is going to be having a hip surgery as well.  He was hospitalized for COVID-19 in October 2020.  He was at the [facility] and did receive antibody infusions.  He was not intubated but did receive high flow oxygen.  He was hospitalized for 6 days in total.  He has had some wheezing since the COVID-19 infection, but otherwise has done well.  He did have a reaction to the first COVID-19 vaccination (Moderna).  Around the 4.5 hour mark, he developed hives over the entire body. He did have some coughing with this as well. He went to the ED but he did not stay since they were so slow. He has not had problems with vaccines since that time. He was in the [employer] and had a lot of vaccines without issues. Towards the end of the military time, he had issues with the skin breaking  out in 1994. He had no problems since that time. He had had no issues with the bowel preps in the past.   Otherwise, there is no history of other atopic diseases, including asthma, stinging insect allergies, eczema or contact dermatitis. There is no significant infectious history. Vaccinations are up to date.    Past Medical History: Patient Active Problem List   Diagnosis Date Noted  . Food intolerance 07/09/2019  . Chronic urticaria 07/09/2019  . Seasonal and perennial allergic rhinitis 07/09/2019  . Elevated liver enzymes   . Left sided abdominal pain 04/18/2019  . Diarrhea due to COVID-19 03/11/2019  . Vomiting 03/10/2019  . COVID-19 virus infection 03/10/2019  . Hypokalemia 03/10/2019  . AKI (acute kidney injury) ([place]) 03/10/2019  . Metabolic acidosis 99991111  . Lumbar radiculopathy 01/26/2019  . LUQ pain 06/19/2016  . Symptomatic cholelithiasis 08/30/2015  . Preoperative cardiovascular examination   . Coronary artery disease involving native coronary artery of native heart  without angina pectoris   . Gastroenteritis 08/26/2015  . Cholelithiases 08/24/2015  . Abdominal pain 08/24/2015  . Cholecystitis, acute 08/24/2015  . Diarrhea 08/24/2015  . Acute cholecystitis 08/24/2015  . Nausea without vomiting 04/08/2015  . Diverticulitis of colon   . Cerebral thrombosis with cerebral infarction ([place]) 03/21/2014  . Stroke ([place]) 03/21/2014  . Essential hypertension 03/21/2014  . TIA (transient ischemic attack)   . HLD (hyperlipidemia)   . Thyroid activity decreased   . Colitis 11/20/2012  . CAD (coronary artery disease)   . Hip pain 08/04/2011  . 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 LIVER FUNCTION TESTS, ABNORMAL, HX OF 12/17/2008    Medication List:  Allergies as of 07/09/2019      Reactions   Other Hives, Shortness Of Breath   Cockroaches   Iohexol Hives   Shrimp [shellfish Allergy] Hives   Wheat Bran Hives   Yeast-related Products Hives      Medication List       Accurate as of July 09, 2019  1:13 PM. If you have any questions, ask your nurse or doctor.        acetaminophen 500 MG tablet Commonly known as: TYLENOL Take 500-1,000 mg by mouth every 6 (six) hours as needed for mild pain or fever.   Ambien 10 MG tablet Generic drug: zolpidem Take 10 mg by mouth at bedtime as needed for sleep.   amoxicillin-clavulanate 875-125 MG tablet Commonly known as: Augmentin Take 1 tablet by mouth 2 (two) times daily. One po bid x 7 days   benzonatate 100 MG capsule Commonly known as: TESSALON Take 1 capsule (100 mg total) by mouth every 8 (eight) hours.   cetirizine 10 MG chewable tablet Commonly known as: ZYRTEC Chew 1 tablet (10 mg total) by mouth daily.   Dexilant 60 MG capsule Generic drug: dexlansoprazole Take 1 capsule (60 mg total) by mouth daily before breakfast.   dicyclomine 10 MG capsule Commonly known as: BENTYL Take 1 capsule (10 mg total) by mouth 4 (four) times daily -  before meals and at bedtime. As  needed for loose stool, abdominal cramping. Hold if constipation.   EPINEPHrine 0.3 mg/0.3 mL Soaj injection Commonly known as: EpiPen Inject 0.3 mLs (0.3 mg total) into the muscle as needed. What changed:   when to take this  reasons to take this   fluticasone 50 MCG/ACT nasal spray Commonly known as: FLONASE Place 2 sprays into both nostrils daily. What changed:   when to take this  reasons to take this   levothyroxine 300 MCG tablet Commonly known as: SYNTHROID Take 300 mcg by mouth every morning.   meclizine 25 MG tablet Commonly known as: ANTIVERT Take 1 tablet (25 mg total) by mouth 3 (three) times daily as needed for dizziness.   olmesartan 20 MG tablet Commonly known as: BENICAR Take 20 mg by mouth daily.   ondansetron 4 MG disintegrating tablet Commonly known as: Zofran ODT 4mg  ODT q4 hours prn nausea/vomit   ondansetron 4 MG tablet Commonly known as: ZOFRAN Take 1 tablet (4 mg total) by mouth every 6 (six) hours. What changed:   when to take this  reasons to take this   oxyCODONE-acetaminophen 10-325 MG tablet Commonly known as: PERCOCET Take 1 tablet by mouth every 6 (six) hours as needed for pain.   promethazine 25 MG tablet Commonly known as: PHENERGAN Take 1 tablet (25 mg total) by mouth every 6 (six) hours as needed for nausea or vomiting.       Birth History: non-contributory  Developmental History: non-contributory  Past Surgical History: Past Surgical History:  Procedure Laterality Date  . APPENDECTOMY     8th grade  . BACK SURGERY    . CARDIAC CATHETERIZATION  2009  . CARDIAC CATHETERIZATION     Per medical history from dated 06/13/10.  . CERVICAL SPINE SURGERY     C4, C5, C6 spinal fusion  . CHOLECYSTECTOMY N/A 08/31/2015   Procedure: LAPAROSCOPIC CHOLECYSTECTOMY WITH INTRAOPERATIVE CHOLANGIOGRAM;  Surgeon: Excell Seltzer, MD;  Location: [facility];  Service: General;  Laterality: N/A;  . COLONOSCOPY  07/2008   Colitis,NSAID v.  Ischemia,malrotation of the gut,Diverticulosis(L),hyperplastic  . COLONOSCOPY N/A 12/13/2012   Dr. Oneida Alar: Normal TI, mild sigmoid diverticulosis, hemorrhoids, 2 polyps (tubular adenoma). Random colon bx negative. Next TCS 12/2022 with Fentanyl/phenergan  . ESOPHAGOGASTRODUODENOSCOPY N/A 04/16/2014   RMR: Erosive reflux esophagitis. Non critical Schzki's ring not manipulated. Small hiatal hernia. Abnormal gastirc mucosa of doubtful signigicance status post biopsy. I suspect trivial upper GI bleed. Recent abdominal pain presumably secondary to a protracted bout of diverticulitis. CT scan November 23 revealed improvemetn without complication. He finished his antibiotics 2 days age. Left sided ab  . JOINT REPLACEMENT    . LAPAROSCOPIC SIGMOID COLECTOMY  2012   Dr. Fanny Skates: recurrent sigmoid diverticulitis  . LAPAROSCOPIC SIGMOID COLECTOMY  2012   recurernt sigmoid diverticulitis, Dr. Dalbert Batman   . ROTATOR CUFF REPAIR     Left - per medical history form dated 06/13/10.  Marland Kitchen SHOULDER SURGERY     Left  . THYROIDECTOMY     Per medical history form dated 06/13/10.  Marland Kitchen TOTAL HIP ARTHROPLASTY  07/04/2011   Procedure: TOTAL HIP ARTHROPLASTY;  Surgeon: Johnny Bridge, MD;  Location: [place];  Service: Orthopedics;  Laterality: Right;     Family History: Family History  Problem Relation Age of Onset  . Cancer Mother        breast cancer - per medical history form dated 06/13/10.  . Diverticulitis Mother   . Hypertension Mother   . Cancer Father        skin - per medical history form dated 06/13/10.  Marland Kitchen Hypertension Father   . Anesthesia problems Neg Hx   . Hypotension Neg Hx   . Malignant hyperthermia Neg Hx   . Pseudochol deficiency Neg Hx   . Colon cancer Neg Hx      Social History: Aundre lives at home in a house that is 59+ years old.  There  is hardwood throughout the home.  He has a heat pump for heating and central cooling.  There is a cat inside of the home.  He does have dust mite covers on his  bed, but on his window.  There is no tobacco exposure at this point, but he did smoke 1 pack/day for 37 years.  He works as a Airline pilot in [employer] and also works part-time at [employer] at the airport.    Review of Systems  Constitutional: Negative.  Negative for chills, fever, malaise/fatigue and weight loss.  HENT: Positive for congestion and sinus pain. Negative for ear discharge and ear pain.   Eyes: Negative for pain, discharge and redness.  Respiratory: Negative for cough, sputum production, shortness of breath and wheezing.   Cardiovascular: Negative.  Negative for chest pain and palpitations.  Gastrointestinal: Positive for abdominal pain, nausea and vomiting. Negative for constipation, diarrhea and heartburn.  Skin: Positive for itching and rash.  Neurological: Negative for dizziness and headaches.  Endo/Heme/Allergies: Positive for environmental allergies. Does not bruise/bleed easily.       Objective:   Blood pressure 124/68, pulse 66, temperature 97.7 F (36.5 C), temperature source Temporal, resp. rate 18, height 6' 0.5" (1.842 m), weight 238 lb 6.4 oz (108.1 kg), SpO2 96 %. Body mass index is 31.89 kg/m.   Physical Exam:   Physical Exam  Constitutional: He appears well-developed.  Obese male.  Cooperative with the exam.  Very friendly.  HENT:  Head: Normocephalic and atraumatic.  Right Ear: Tympanic membrane, external ear and ear canal normal. No drainage, swelling or tenderness. Tympanic membrane is not injected, not scarred, not erythematous, not retracted and not bulging.  Left Ear: Tympanic membrane, external ear and ear canal normal. No drainage, swelling or tenderness. Tympanic membrane is not injected, not scarred, not erythematous, not retracted and not bulging.  Nose: Mucosal edema and rhinorrhea present. No nasal deformity, septal deviation or nasal septal hematoma. No epistaxis. Right sinus exhibits no maxillary sinus tenderness and no  frontal sinus tenderness. Left sinus exhibits no maxillary sinus tenderness and no frontal sinus tenderness.  Mouth/Throat: Uvula is midline and oropharynx is clear and moist. Mucous membranes are not pale and not dry.  No nasal polyps appreciated.  There is cobblestoning in the posterior oropharynx.  Eyes: Pupils are equal, round, and reactive to light. Conjunctivae and EOM are normal. Right eye exhibits no chemosis and no discharge. Left eye exhibits no chemosis and no discharge. Right conjunctiva is not injected. Left conjunctiva is not injected.  Cardiovascular: Normal rate, regular rhythm and normal heart sounds.  Respiratory: Effort normal and breath sounds normal. No accessory muscle usage. No tachypnea. No respiratory distress. He has no wheezes. He has no rhonchi. He has no rales. He exhibits no tenderness.  Moving air well in all lung fields.  No increased work of breathing.  GI: There is no abdominal tenderness. There is no rebound and no guarding.  Lymphadenopathy:       Head (right side): No submandibular, no tonsillar and no occipital adenopathy present.       Head (left side): No submandibular, no tonsillar and no occipital adenopathy present.    He has no cervical adenopathy.  Neurological: He is alert.  Skin: No abrasion, no petechiae and no rash noted. Rash is not papular, not vesicular and not urticarial. No erythema. No pallor.  No eczematous lesions noted.  He does have some dermatographia some present.  No overt hives.  Psychiatric: He  has a normal mood and affect.     Diagnostic studies:    Allergy Studies:    Airborne Adult Perc - 07/09/19 0919    Time Antigen Placed  0919    Allergen Manufacturer  Lavella Hammock    Location  Back    Number of Test  59    1. Control-Buffer 50% Glycerol  Negative    2. Control-Histamine 1 mg/ml  Negative    3. Albumin saline  Negative    4. Lamar  Negative    5. [place]  Negative    6. Johnson  Negative    8. Meadow Fescue  Negative      9. Perennial Rye  Negative    10. Sweet Vernal  Negative    11. Timothy  Negative    12. Cocklebur  Negative    13. Burweed Marshelder  Negative    14. Ragweed, short  Negative    15. Ragweed, Giant  Negative    16. Plantain,  English  2+    17. Lamb's Quarters  Negative    18. Sheep Sorrell  Negative    19. Rough Pigweed  Negative    20. Marsh Elder, Rough  Negative    21. Mugwort, Common  Negative    22. Ash mix  Negative    23. Birch mix  Negative    24. Beech American  Negative    25. Box, Elder  Negative    26. Cedar, red  Negative    27. Cottonwood, [place]  Negative    28. Elm mix  Negative    29. Hickory mix  Negative    30. Maple mix  3+    31. Oak, [place] mix  Negative    32. Pecan Pollen  Negative    33. Pine mix  Negative    34. Sycamore Eastern  Negative    35. [place], Black Pollen  Negative    36. Alternaria alternata  Negative    37. Cladosporium Herbarum  Negative    38. Aspergillus mix  Negative    39. Penicillium mix  3+    40. Bipolaris sorokiniana (Helminthosporium)  Negative    41. Drechslera spicifera (Curvularia)  Negative    42. Mucor plumbeus  Negative    43. Fusarium moniliforme  Negative    44. Aureobasidium pullulans (pullulara)  Negative    45. Rhizopus oryzae  Negative    46. Botrytis cinera  Negative    47. Epicoccum nigrum  Negative    48. Phoma betae  Negative    49. Candida Albicans  Negative    50. Trichophyton mentagrophytes  Negative    51. Mite, D Farinae  5,000 AU/ml  Negative    52. Mite, D Pteronyssinus  5,000 AU/ml  Negative    53. Cat Hair 10,000 BAU/ml  Negative    54.  Dog Epithelia  Negative    55. Mixed Feathers  Negative    56. Horse Epithelia  Negative    57. Cockroach, German  Negative    58. Mouse  Negative    59. Tobacco Leaf  Negative     Intradermal - 07/09/19 1310    Time Antigen Placed  1000    Allergen Manufacturer  Greer    Location  Arm    Number of Test  13    Intradermal  Select    Control   Negative    Gu[place]  1+    Johnson  Negative  7 Grass  Negative    Ragweed mix  Negative    Weed mix  Negative    Mold 1  Negative    Mold 3  Negative    Mold 4  3+    Cat  Negative    Dog  2+    Cockroach  4+    Mite mix  4+     Food Adult Perc - 07/09/19 0900    Time Antigen Placed  0920    Allergen Manufacturer  Lavella Hammock    Location  Back    Number of allergen test  29    Control-Histamine 1 mg/ml  Negative    1. Peanut  Negative    2. Soybean  Negative    3. Wheat  --   2x5   4. Sesame  Negative    5. Milk, cow  Negative    6. Egg White, Chicken  Negative    7. Casein  Negative    8. Shellfish Mix  Negative    9. Fish Mix  Negative    10. Cashew  Negative    11. Pecan Food  Negative    12. Alpine  Negative    13. Almond  --   2x2 tolerates   14. Hazelnut  --   tolerates   15. [place] nut  --   tolerates   16. Coconut  --   tolerates   17. Pistachio  --   tolerates   18. Catfish  Negative    19. Bass  Negative    20. Trout  Negative    21. Tuna  Negative    22. Salmon  Negative    23. Flounder  Negative    24. Codfish  Negative    25. Shrimp  Negative    26. Crab  Negative    27. Lobster  Negative    28. Oyster  --   2x5   29. Scallops  Negative       Allergy testing results were read and interpreted by myself, documented by clinical staff.         Salvatore Marvel, MD [facility] [place] [facility] [place]

## 2019-07-09 NOTE — Patient Instructions (Addendum)
1. Food intolerance - Testing today showed slight reactivities to wheat, oyster, and almond. - You seem to have a good handle on the wheat, but it does make sense with some your symptoms. - I would avoid oysters and be sure to tell any restaurants that you are allergic to oysters so that there is no cross-contamination. - I doubt the almond is relevant given its small size and the fact that almond allergy is exceedingly rare. - EpiPen is up to date. - Anaphylaxis management plan provided.  2. Chronic urticaria - Your history does not have any "red flags" such as fevers, joint pains, or permanent skin changes that would be concerning for a more serious cause of hives.  - There are several possible triggers on testing today, including all of the environmental triggers. - We could do a lab work-up, but again I do not see any red flags in her history. - Chronic hives are often times a self limited process and will "burn themselves out" over 6-12 months, although clearly this is not the case review. - Sample of Xolair provided today. - Consent for Xolair provided today. - There are excellent co-pay programs available and Tammy will reach out to you to discuss the options. - Most people pay $5 per month further Xolair. - At future urticarial outbreaks, add on Zyrtec 1 to 2 tablets up to twice daily to suppress the hives. - Call us if this is not working.  3. Chronic rhinitis - Testing today showed: grasses, weeds, trees, indoor molds, dust mites, dog and cockroach - Copy of test results provided.  - Avoidance measures provided. - Continue with: as needed antihistamines - I do not think that allergy shots are indicated at this time. - They do not do as much to help with hives. - If you were having more classical allergic rhinitis symptoms (sneezing, watery eyes, runny nose, etc), allergy shots would definitely be the way to go.   4. Allergic reaction to vaccine - I do not think that your  reaction was a true allergy.  - Reassuringly, you have tolerated polyethylene glycol, which is thought to be the main allergenic component of the [employer] vaccinations. - I do not think he need a second dose anyway because there is mounting evidence that 1 dose is adequate for people who have had a severe COVID-19 infection.  5. Return in about 4 weeks (around 08/06/2019). This can be an in-person, a virtual Webex or a telephone follow up visit.   Please inform us of any Emergency Department visits, hospitalizations, or changes in symptoms. Call us before going to the ED for breathing or allergy symptoms since we might be able to fit you in for a sick visit. Feel free to contact us anytime with any questions, problems, or concerns.  It was a pleasure to meet you today!  Websites that have reliable patient information: 1. American Academy of Asthma, Allergy, and Immunology: www.aaaai.org 2. Food Allergy Research and Education (FARE): foodallergy.org 3. Mothers of Asthmatics: http://www.asthmacommunitynetwork.org 4. American College of Allergy, Asthma, and Immunology: www.acaai.org   COVID-19 Vaccine Information can be found at: ShippingScam.co.uk For questions related to vaccine distribution or appointments, please email [email or web address] or call [phone].     "Like" [place] on Facebook and Instagram for our latest updates!        Make sure you are registered to vote! If you have moved or changed any of your contact information, you will need to get this updated before  voting!  In some cases, you MAY be able to register to vote online: [email or web address]    Reducing Pollen Exposure  The American Academy of Allergy, Asthma and Immunology suggests the following steps to reduce your exposure to pollen during allergy seasons.    1. Do not hang sheets or clothing out to dry; pollen  may collect on these items. 2. Do not mow lawns or spend time around freshly cut grass; mowing stirs up pollen. 3. Keep windows closed at night.  Keep car windows closed while driving. 4. Minimize morning activities outdoors, a time when pollen counts are usually at their highest. 5. Stay indoors as much as possible when pollen counts or humidity is high and on windy days when pollen tends to remain in the air longer. 6. Use air conditioning when possible.  Many air conditioners have filters that trap the pollen spores. 7. Use a HEPA room air filter to remove pollen form the indoor air you breathe.  Control of Mold Allergen   Mold and fungi can grow on a variety of surfaces provided certain temperature and moisture conditions exist.  Outdoor molds grow on plants, decaying vegetation and soil.  The major outdoor mold, Alternaria and Cladosporium, are found in very high numbers during hot and dry conditions.  Generally, a late Summer - Fall peak is seen for common outdoor fungal spores.  Rain will temporarily lower outdoor mold spore count, but counts rise rapidly when the rainy period ends.  The most important indoor molds are Aspergillus and Penicillium.  Dark, humid and poorly ventilated basements are ideal sites for mold growth.  The next most common sites of mold growth are the bathroom and the kitchen.  Indoor (Perennial) Mold Control   Positive indoor molds via skin testing: Aspergillus, Fusarium, Aureobasidium (Pullulara) and Rhizopus  1. Maintain humidity below 50%. 2. Clean washable surfaces with 5% bleach solution. 3. Remove sources e.g. contaminated carpets.     Control of Dog or Cat Allergen  Avoidance is the best way to manage a dog or cat allergy. If you have a dog or cat and are allergic to dog or cats, consider removing the dog or cat from the home. If you have a dog or cat but don't want to find it a new home, or if your family wants a pet even though someone in the  household is allergic, here are some strategies that may help keep symptoms at bay:  1. Keep the pet out of your bedroom and restrict it to only a few rooms. Be advised that keeping the dog or cat in only one room will not limit the allergens to that room. 2. Don't pet, hug or kiss the dog or cat; if you do, wash your hands with soap and water. 3. High-efficiency particulate air (HEPA) cleaners run continuously in a bedroom or living room can reduce allergen levels over time. 4. Regular use of a high-efficiency vacuum cleaner or a central vacuum can reduce allergen levels. 5. Giving your dog or cat a bath at least once a week can reduce airborne allergen.  Control of Dust Mite Allergen    Dust mites play a major role in allergic asthma and rhinitis.  They occur in environments with high humidity wherever human skin is found.  Dust mites absorb humidity from the atmosphere (ie, they do not drink) and feed on organic matter (including shed human and animal skin).  Dust mites are a microscopic type of insect that you cannot see with  the naked eye.  High levels of dust mites have been detected from mattresses, pillows, carpets, upholstered furniture, bed covers, clothes, soft toys and any woven material.  The principal allergen of the dust mite is found in its feces.  A gram of dust may contain 1,000 mites and 250,000 fecal particles.  Mite antigen is easily measured in the air during house cleaning activities.  Dust mites do not bite and do not cause harm to humans, other than by triggering allergies/asthma.    Ways to decrease your exposure to dust mites in your home:  1. Encase mattresses, box springs and pillows with a mite-impermeable barrier or cover   2. Wash sheets, blankets and drapes weekly in hot water (130 F) with detergent and dry them in a dryer on the hot setting.  3. Have the room cleaned frequently with a vacuum cleaner and a damp dust-mop.  For carpeting or rugs, vacuuming with a  vacuum cleaner equipped with a high-efficiency particulate air (HEPA) filter.  The dust mite allergic individual should not be in a room which is being cleaned and should wait 1 hour after cleaning before going into the room. 4. Do not sleep on upholstered furniture (eg, couches).   5. If possible removing carpeting, upholstered furniture and drapery from the home is ideal.  Horizontal blinds should be eliminated in the rooms where the person spends the most time (bedroom, study, television room).  Washable vinyl, roller-type shades are optimal. 6. Remove all non-washable stuffed toys from the bedroom.  Wash stuffed toys weekly like sheets and blankets above.   7. Reduce indoor humidity to less than 50%.  Inexpensive humidity monitors can be purchased at most hardware stores.  Do not use a humidifier as can make the problem worse and are not recommended.  Control of Cockroach Allergen  Cockroach allergen has been identified as an important cause of acute attacks of asthma, especially in urban settings.  There are fifty-five species of cockroach that exist in the [place], however only three, the [place], [place] species produce allergen that can affect patients with Asthma.  Allergens can be obtained from fecal particles, egg casings and secretions from cockroaches.    1. Remove food sources. 2. Reduce access to water. 3. Seal access and entry points. 4. Spray runways with 0.5-1% Diazinon or Chlorpyrifos 5. Blow boric acid power under stoves and refrigerator. 6. Place bait stations (hydramethylnon) at feeding sites.

## 2019-07-14 NOTE — Patient Instructions (Addendum)
DUE TO COVID-19 ONLY ONE VISITOR IS ALLOWED TO COME WITH YOU AND STAY IN THE WAITING ROOM ONLY DURING PRE OP AND PROCEDURE DAY OF SURGERY. THE 1 VISITOR MAY VISIT WITH YOU AFTER SURGERY IN YOUR PRIVATE ROOM DURING VISITING HOURS ONLY!  YOU NEED TO HAVE A COVID 19 TEST ON___3/12____ @2 :50_______, THIS TEST MUST BE DONE BEFORE SURGERY, COME  [name] , [id number].  ([place]) ONCE YOUR COVID TEST IS COMPLETED, PLEASE BEGIN THE QUARANTINE INSTRUCTIONS AS OUTLINED IN YOUR HANDOUT.                Andrew Love    Your procedure is scheduled on:  07/22/19  Report to [facility] Main  Entrance   Report to admitting at    8:20 AM     Call this number if you have problems the morning of surgery [phone]   Do not eat food After Midnight.   YOU MAY HAVE CLEAR LIQUIDS FROM MIDNIGHT UNTIL 7:30AM.   At 7:30AM Please finish the prescribed Pre-Surgery  drink.   Nothing by mouth after you finish the  drink !   BRUSH YOUR TEETH MORNING OF SURGERY AND RINSE YOUR MOUTH OUT, NO CHEWING GUM CANDY OR MINTS.     Take these medicines the morning of surgery with A SIP OF WATER: Levothyroxine, Dexilant  DO NOT TAKE ANY DIABETIC MEDICATIONS DAY OF YOUR SURGERY                               You may not have any metal on your body including             piercings  Do not wear jewelry,  lotions, powders or  deodorant                     Men may shave face and neck.   Do not bring valuables to the hospital. [place].  Contacts, dentures or bridgework may not be worn into surgery.      Patients discharged the day of surgery will not be allowed to drive home.  IF YOU ARE HAVING SURGERY AND GOING HOME THE SAME DAY, YOU MUST HAVE AN ADULT TO DRIVE YOU HOME AND BE WITH YOU FOR 24 HOURS . YOU MAY GO HOME BY TAXI OR UBER OR ORTHERWISE, BUT AN ADULT MUST ACCOMPANY YOU HOME AND STAY WITH YOU FOR 24 HOURS.  Name  and phone number of your driver:  Special Instructions: N/A              Please read over the following fact sheets you were given: _____________________________________________________________________             [facility] - Preparing for Surgery  Before surgery, you can play an important role.   Because skin is not sterile, your skin needs to be as free of germs as possible.   You can reduce the number of germs on your skin by washing with CHG (chlorahexidine gluconate) soap before surgery.   CHG is an antiseptic cleaner which kills germs and bonds with the skin to continue killing germs even after washing. Please DO NOT use if you have an allergy to CHG or antibacterial soaps .  If your skin becomes reddened/irritated stop using the CHG and inform  your nurse when you arrive at Short Stay.  You may shave your face/neck. Please follow these instructions carefully:  1.  Shower with CHG Soap the night before surgery and the  morning of Surgery.  2.  If you choose to wash your hair, wash your hair first as usual with your  normal  shampoo.  3.  After you shampoo, rinse your hair and body thoroughly to remove the  shampoo.                                        4.  Use CHG as you would any other liquid soap.  You can apply chg directly  to the skin and wash                       Gently with a scrungie or clean washcloth.  5.  Apply the CHG Soap to your body ONLY FROM THE NECK DOWN.   Do not use on face/ open                           Wound or open sores. Avoid contact with eyes, ears mouth and genitals (private parts).                       Wash face,  Genitals (private parts) with your normal soap.             6.  Wash thoroughly, paying special attention to the area where your surgery  will be performed.  7.  Thoroughly rinse your body with warm water from the neck down.  8.  DO NOT shower/wash with your normal soap after using and rinsing off  the CHG Soap.             9.  Pat  yourself dry with a clean towel.            10.  Wear clean pajamas.            11.  Place clean sheets on your bed the night of your first shower and do not  sleep with pets. Day of Surgery : Do not apply any lotions/deodorants the morning of surgery.  Please wear clean clothes to the hospital/surgery center.  FAILURE TO FOLLOW THESE INSTRUCTIONS MAY RESULT IN THE CANCELLATION OF YOUR SURGERY PATIENT SIGNATURE_________________________________  NURSE SIGNATURE__________________________________  ________________________________________________________________________   Andrew Love  An incentive spirometer is a tool that can help keep your lungs clear and active. This tool measures how well you are filling your lungs with each breath. Taking long deep breaths may help reverse or decrease the chance of developing breathing (pulmonary) problems (especially infection) following:  A long period of time when you are unable to move or be active. BEFORE THE PROCEDURE   If the spirometer includes an indicator to show your best effort, your nurse or respiratory therapist will set it to a desired goal.  If possible, sit up straight or lean slightly forward. Try not to slouch.  Hold the incentive spirometer in an upright position. INSTRUCTIONS FOR USE  1. Sit on the edge of your bed if possible, or sit up as far as you can in bed or on a chair. 2. Hold the incentive spirometer in an upright position. 3. Breathe out normally. 4. Place the mouthpiece in  your mouth and seal your lips tightly around it. 5. Breathe in slowly and as deeply as possible, raising the piston or the ball toward the top of the column. 6. Hold your breath for 3-5 seconds or for as long as possible. Allow the piston or ball to fall to the bottom of the column. 7. Remove the mouthpiece from your mouth and breathe out normally. 8. Rest for a few seconds and repeat Steps 1 through 7 at least 10 times every 1-2 hours  when you are awake. Take your time and take a few normal breaths between deep breaths. 9. The spirometer may include an indicator to show your best effort. Use the indicator as a goal to work toward during each repetition. 10. After each set of 10 deep breaths, practice coughing to be sure your lungs are clear. If you have an incision (the cut made at the time of surgery), support your incision when coughing by placing a pillow or rolled up towels firmly against it. Once you are able to get out of bed, walk around indoors and cough well. You may stop using the incentive spirometer when instructed by your caregiver.  RISKS AND COMPLICATIONS  Take your time so you do not get dizzy or light-headed.  If you are in pain, you may need to take or ask for pain medication before doing incentive spirometry. It is harder to take a deep breath if you are having pain. AFTER USE  Rest and breathe slowly and easily.  It can be helpful to keep track of a log of your progress. Your caregiver can provide you with a simple table to help with this. If you are using the spirometer at home, follow these instructions: [place] IF:   You are having difficultly using the spirometer.  You have trouble using the spirometer as often as instructed.  Your pain medication is not giving enough relief while using the spirometer.  You develop fever of 100.5 F (38.1 C) or higher. SEEK IMMEDIATE MEDICAL CARE IF:   You cough up bloody sputum that had not been present before.  You develop fever of 102 F (38.9 C) or greater.  You develop worsening pain at or near the incision site. MAKE SURE YOU:   Understand these instructions.  Will watch your condition.  Will get help right away if you are not doing well or get worse. Document Released: 09/04/2006 Document Revised: 07/17/2011 Document Reviewed: 11/05/2006 [facility] Patient Information 2014 [place],  [place].   ________________________________________________________________________

## 2019-07-16 ENCOUNTER — Encounter (HOSPITAL_COMMUNITY)
Admission: RE | Admit: 2019-07-16 | Discharge: 2019-07-16 | Disposition: A | Discharge status: Home / Self Care | Payer: 59 | Source: Ambulatory Visit | Attending: Orthopedic Surgery | Admitting: Orthopedic Surgery

## 2019-07-16 ENCOUNTER — Telehealth: Payer: Self-pay | Admitting: *Deleted

## 2019-07-16 ENCOUNTER — Other Ambulatory Visit: Payer: Self-pay

## 2019-07-16 ENCOUNTER — Encounter (HOSPITAL_COMMUNITY): Payer: Self-pay

## 2019-07-16 DIAGNOSIS — I10 Essential (primary) hypertension: Secondary | ICD-10-CM | POA: Diagnosis not present

## 2019-07-16 DIAGNOSIS — Z01812 Encounter for preprocedural laboratory examination: Secondary | ICD-10-CM | POA: Diagnosis not present

## 2019-07-16 DIAGNOSIS — Z87891 Personal history of nicotine dependence: Secondary | ICD-10-CM | POA: Diagnosis not present

## 2019-07-16 DIAGNOSIS — M1612 Unilateral primary osteoarthritis, left hip: Secondary | ICD-10-CM | POA: Diagnosis not present

## 2019-07-16 DIAGNOSIS — Z79899 Other long term (current) drug therapy: Secondary | ICD-10-CM | POA: Insufficient documentation

## 2019-07-16 DIAGNOSIS — E039 Hypothyroidism, unspecified: Secondary | ICD-10-CM | POA: Insufficient documentation

## 2019-07-16 DIAGNOSIS — I2510 Atherosclerotic heart disease of native coronary artery without angina pectoris: Secondary | ICD-10-CM | POA: Diagnosis not present

## 2019-07-16 DIAGNOSIS — G4730 Sleep apnea, unspecified: Secondary | ICD-10-CM | POA: Diagnosis not present

## 2019-07-16 HISTORY — DX: Gastro-esophageal reflux disease without esophagitis: K21.9

## 2019-07-16 LAB — BASIC METABOLIC PANEL
Anion gap: 7 (ref 5–15)
BUN: 11 mg/dL (ref 6–20)
CO2: 24 mmol/L (ref 22–32)
Calcium: 8.7 mg/dL — ABNORMAL LOW (ref 8.9–10.3)
Chloride: 107 mmol/L (ref 98–111)
Creatinine, Ser: 1.04 mg/dL (ref 0.61–1.24)
GFR calc Af Amer: >60 mL/min (ref >60)
GFR calc non Af Amer: >60 mL/min (ref >60)
Glucose, Bld: 90 mg/dL (ref 70–99)
Potassium: 4.1 mmol/L (ref 3.5–5.1)
Sodium: 138 mmol/L (ref 135–145)

## 2019-07-16 LAB — CBC
HCT: 46.6 % (ref 39.0–52.0)
Hemoglobin: 16 g/dL (ref 13.0–17.0)
MCH: 31 pg (ref 26.0–34.0)
MCHC: 34.3 g/dL (ref 30.0–36.0)
MCV: 90.3 fL (ref 80.0–100.0)
Platelets: 210 10*3/uL (ref 150–400)
RBC: 5.16 MIL/uL (ref 4.22–5.81)
RDW: 12.5 % (ref 11.5–15.5)
WBC: 7.5 10*3/uL (ref 4.0–10.5)
nRBC: 0 % (ref 0.0–0.2)

## 2019-07-16 LAB — SURGICAL PCR SCREEN
MRSA, PCR: NEGATIVE
Staphylococcus aureus: NEGATIVE

## 2019-07-16 NOTE — Telephone Encounter (Signed)
L/M for patient to contact me to advise approval, submit, copay card

## 2019-07-16 NOTE — Progress Notes (Signed)
PCP - Dr. Selena Batten Cardiologist - Dr. Marella Chimes  Chest x-ray - no EKG - 03/11/19 Stress Test - no ECHO - 2016 Cardiac Cath - 2009,2012  Sleep Study - yes CPAP - yes  Fasting Blood Sugar - NA Checks Blood Sugar _____ times a day  Blood Thinner Instructions:NA Aspirin Instructions: Last Dose:  Anesthesia review:   Patient denies shortness of breath, fever, cough and chest pain at PAT appointment yes  Patient verbalized understanding of instructions that were given to them at the PAT appointment. Patient was also instructed that they will need to review over the PAT instructions again at home before surgery. yes

## 2019-07-18 ENCOUNTER — Other Ambulatory Visit (HOSPITAL_COMMUNITY)
Admission: RE | Admit: 2019-07-18 | Discharge: 2019-07-18 | Disposition: A | Discharge status: Home / Self Care | Payer: 59 | Source: Ambulatory Visit | Attending: Orthopedic Surgery | Admitting: Orthopedic Surgery

## 2019-07-18 DIAGNOSIS — Z01812 Encounter for preprocedural laboratory examination: Secondary | ICD-10-CM | POA: Insufficient documentation

## 2019-07-18 DIAGNOSIS — Z20822 Contact with and (suspected) exposure to covid-19: Secondary | ICD-10-CM | POA: Diagnosis not present

## 2019-07-18 NOTE — Progress Notes (Signed)
Anesthesia Chart Review   Case: [id number] Date/Time: 07/22/19 1035   Procedure: TOTAL HIP ARTHROPLASTY (Left Hip)   Anesthesia type: Choice   Pre-op diagnosis: DEGENERATIVE JOINT DISEASE  left hip   Location: [place] / [facility]   Surgeons: Marchia Bond, MD      DISCUSSION:54 y.o. former smoker (9 pack years, quit 02/15/2019) with h/o nonobstructive CAD on cath 2009, low risk myoview 2016, hypothyroidism, HTN, sleep apnea with CPAP, GERD, previous COVID hospitalization 03/2019, left hip djd scheduled for above procedure 07/22/19 with Dr. Marchia Bond.   Pt stable at PAT visit.    Anticipate pt can proceed with planned procedure barring acute status change.   VS: BP 138/87   Pulse 72   Temp 36.4 C (Oral)   Resp 18   Ht 6' (1.829 m)   Wt 109.3 kg   SpO2 97%   BMI 32.69 kg/m   PROVIDERS: Redmond School, MD is PCP   Marella Chimes, MD is Cardiologist  LABS: Labs reviewed: Acceptable for surgery. (all labs ordered are listed, but only abnormal results are displayed)  Labs Reviewed - No data to display   IMAGES:   EKG: 03/11/2019 Rate 95 bpm  Sinus rhythm  Abnormal R-wave progression, early transition  Baseline wander in lead (s) I II aVR  CV: Echo 01/07/2015 Interpretation Summary A complete two-dimensional transthoracic echocardiogram was performed (2D, M-mode, Doppler and color flow Doppler.)  Left ventricular systolic function is normal.  There is borderline concentric left ventricular hypertrophy.  The transmittal spectral Doppler flow pattern is suggestive of impaired LV relaxation.  Trace aortic regurgitation.  There is trace tricuspid regurgitation There is trace mitral regurgitation   Myocardial Perfusion 01/07/2015 Impression  The post stress myocardial perfusion images show a normal pattern of perfusion in the all regions.  The post stress left ventricle is normal in size.  There is no scintigraphic evidence of inducible myocardial ischemia.  The  post-stress ejection fraction is 61%.  No significant wall motion abnormalities noted.  Global left ventricular systolic function is normal.   Exercise capacity 10 METS.  EKG is negative for ischemia Normal myocardial perfusion imaging.  No prior study available for comparison.  Past Medical History:  Diagnosis Date  . Arthritis   . Back pain    chronic  . Bronchitis    history of  . CAD (coronary artery disease)   . Chronic back pain   . Chronic insomnia    Per medical history form dated 06/13/10.  . Chronic left hip pain   . Colitis    Per medical history from dated 06/13/10.  . Diverticulitis    Hx of; requiring 3 admissions  . Elevated liver enzymes   . GERD (gastroesophageal reflux disease)   . Hypertension   . Hypothyroidism   . Left leg pain    chronic  . 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 Pneumonia 02/2019  . Psoriasis    Per medical history form dated 06/13/10.  . Sigmoid colon ulcer    Rectal polyps  . Sleep apnea with use of continuous positive airway pressure (CPAP)   . Urticaria     Past Surgical History:  Procedure Laterality Date  . APPENDECTOMY     8th grade  . BACK SURGERY    . CARDIAC CATHETERIZATION  2009  . CARDIAC CATHETERIZATION     Per medical history from dated 06/13/10.  . CERVICAL SPINE SURGERY     C4, C5, C6 spinal  fusion  . CHOLECYSTECTOMY N/A 08/31/2015   Procedure: LAPAROSCOPIC CHOLECYSTECTOMY WITH INTRAOPERATIVE CHOLANGIOGRAM;  Surgeon: Excell Seltzer, MD;  Location: [facility];  Service: General;  Laterality: N/A;  . COLONOSCOPY  07/2008   Colitis,NSAID v. Ischemia,malrotation of the gut,Diverticulosis(L),hyperplastic  . COLONOSCOPY N/A 12/13/2012   Dr. Oneida Alar: Normal TI, mild sigmoid diverticulosis, hemorrhoids, 2 polyps (tubular adenoma). Random colon bx negative. Next TCS 12/2022 with Fentanyl/phenergan  . ESOPHAGOGASTRODUODENOSCOPY N/A 04/16/2014   RMR: Erosive reflux esophagitis. Non critical Schzki's ring not  manipulated. Small hiatal hernia. Abnormal gastirc mucosa of doubtful signigicance status post biopsy. I suspect trivial upper GI bleed. Recent abdominal pain presumably secondary to a protracted bout of diverticulitis. CT scan November 23 revealed improvemetn without complication. He finished his antibiotics 2 days age. Left sided ab  . JOINT REPLACEMENT    . LAPAROSCOPIC SIGMOID COLECTOMY  2012   Dr. Fanny Skates: recurrent sigmoid diverticulitis  . LAPAROSCOPIC SIGMOID COLECTOMY  2012   recurernt sigmoid diverticulitis, Dr. Dalbert Batman   . ROTATOR CUFF REPAIR     Left - per medical history form dated 06/13/10.  Marland Kitchen SHOULDER SURGERY     Left  . THYROIDECTOMY     Per medical history form dated 06/13/10.  Marland Kitchen TOTAL HIP ARTHROPLASTY  07/04/2011   Procedure: TOTAL HIP ARTHROPLASTY;  Surgeon: Johnny Bridge, MD;  Location: [place];  Service: Orthopedics;  Laterality: Right;    MEDICATIONS: . acetaminophen (TYLENOL) 500 MG tablet  . amoxicillin-clavulanate (AUGMENTIN) 875-125 MG tablet  . benzonatate (TESSALON) 100 MG capsule  . cetirizine (ZYRTEC) 10 MG chewable tablet  . dexlansoprazole (DEXILANT) 60 MG capsule  . dicyclomine (BENTYL) 10 MG capsule  . EPINEPHrine (EPIPEN) 0.3 mg/0.3 mL SOAJ  . fluticasone (FLONASE) 50 MCG/ACT nasal spray  . levothyroxine (SYNTHROID, LEVOTHROID) 300 MCG tablet  . meclizine (ANTIVERT) 25 MG tablet  . olmesartan (BENICAR) 20 MG tablet  . ondansetron (ZOFRAN ODT) 4 MG disintegrating tablet  . ondansetron (ZOFRAN) 4 MG tablet  . oxyCODONE-acetaminophen (PERCOCET) 10-325 MG tablet  . promethazine (PHENERGAN) 25 MG tablet  . zolpidem (AMBIEN) 10 MG tablet   . omalizumab Arvid Right) injection 300 mg   Maia Plan [facility] Pre-Surgical Testing [phone] 07/18/19  12:16 PM

## 2019-07-19 LAB — SARS CORONAVIRUS 2 (TAT 6-24 HRS): SARS Coronavirus 2: NEGATIVE

## 2019-07-22 ENCOUNTER — Ambulatory Visit (HOSPITAL_COMMUNITY): Payer: 59 | Admitting: Physician Assistant

## 2019-07-22 ENCOUNTER — Other Ambulatory Visit: Payer: Self-pay

## 2019-07-22 ENCOUNTER — Encounter (HOSPITAL_COMMUNITY)
Admission: RE | Disposition: A | Discharge status: Home / Self Care | Payer: Self-pay | Source: Home / Self Care | Attending: Orthopedic Surgery

## 2019-07-22 ENCOUNTER — Encounter (HOSPITAL_COMMUNITY): Payer: Self-pay | Admitting: Orthopedic Surgery

## 2019-07-22 ENCOUNTER — Observation Stay (HOSPITAL_COMMUNITY)
Admission: RE | Admit: 2019-07-22 | Discharge: 2019-07-23 | Disposition: A | Discharge status: Home / Self Care | Payer: 59 | Attending: Orthopedic Surgery | Admitting: Orthopedic Surgery

## 2019-07-22 ENCOUNTER — Ambulatory Visit (HOSPITAL_COMMUNITY): Payer: 59 | Admitting: Anesthesiology

## 2019-07-22 ENCOUNTER — Ambulatory Visit (HOSPITAL_COMMUNITY): Payer: 59

## 2019-07-22 DIAGNOSIS — G4730 Sleep apnea, unspecified: Secondary | ICD-10-CM | POA: Diagnosis not present

## 2019-07-22 DIAGNOSIS — Z79899 Other long term (current) drug therapy: Secondary | ICD-10-CM | POA: Diagnosis not present

## 2019-07-22 DIAGNOSIS — M545 Low back pain: Secondary | ICD-10-CM | POA: Diagnosis not present

## 2019-07-22 DIAGNOSIS — K219 Gastro-esophageal reflux disease without esophagitis: Secondary | ICD-10-CM | POA: Insufficient documentation

## 2019-07-22 DIAGNOSIS — Z8673 Personal history of transient ischemic attack (TIA), and cerebral infarction without residual deficits: Secondary | ICD-10-CM | POA: Insufficient documentation

## 2019-07-22 DIAGNOSIS — Z79890 Hormone replacement therapy (postmenopausal): Secondary | ICD-10-CM | POA: Insufficient documentation

## 2019-07-22 DIAGNOSIS — I10 Essential (primary) hypertension: Secondary | ICD-10-CM | POA: Diagnosis not present

## 2019-07-22 DIAGNOSIS — I2510 Atherosclerotic heart disease of native coronary artery without angina pectoris: Secondary | ICD-10-CM | POA: Diagnosis not present

## 2019-07-22 DIAGNOSIS — M1612 Unilateral primary osteoarthritis, left hip: Secondary | ICD-10-CM | POA: Diagnosis not present

## 2019-07-22 DIAGNOSIS — E039 Hypothyroidism, unspecified: Secondary | ICD-10-CM | POA: Diagnosis not present

## 2019-07-22 DIAGNOSIS — E669 Obesity, unspecified: Secondary | ICD-10-CM | POA: Insufficient documentation

## 2019-07-22 DIAGNOSIS — Z96641 Presence of right artificial hip joint: Secondary | ICD-10-CM | POA: Diagnosis not present

## 2019-07-22 DIAGNOSIS — Z6832 Body mass index (BMI) 32.0-32.9, adult: Secondary | ICD-10-CM | POA: Insufficient documentation

## 2019-07-22 DIAGNOSIS — Z87891 Personal history of nicotine dependence: Secondary | ICD-10-CM | POA: Insufficient documentation

## 2019-07-22 DIAGNOSIS — G8929 Other chronic pain: Secondary | ICD-10-CM | POA: Diagnosis not present

## 2019-07-22 DIAGNOSIS — Z96642 Presence of left artificial hip joint: Secondary | ICD-10-CM

## 2019-07-22 DIAGNOSIS — Z96649 Presence of unspecified artificial hip joint: Secondary | ICD-10-CM

## 2019-07-22 HISTORY — PX: TOTAL HIP ARTHROPLASTY: SHX124

## 2019-07-22 SURGERY — ARTHROPLASTY, HIP, TOTAL,POSTERIOR APPROACH
Anesthesia: Spinal | Site: Hip | Laterality: Left

## 2019-07-22 MED ORDER — STERILE WATER FOR IRRIGATION IR SOLN
Status: DC | PRN
  Administered 2019-07-22: 1000 mL

## 2019-07-22 MED ORDER — DOCUSATE SODIUM 100 MG PO CAPS
100.0000 mg | ORAL_CAPSULE | Freq: Two times a day (BID) | ORAL | Status: DC
  Administered 2019-07-22 – 2019-07-23 (×2): 100 mg via ORAL
  Filled 2019-07-22 (×2): qty 1

## 2019-07-22 MED ORDER — FENTANYL CITRATE (PF) 100 MCG/2ML IJ SOLN
INTRAMUSCULAR | Status: AC
  Filled 2019-07-22: qty 2

## 2019-07-22 MED ORDER — SODIUM CHLORIDE 0.9 % IR SOLN
Status: DC | PRN
  Administered 2019-07-22: 1000 mL

## 2019-07-22 MED ORDER — DEXAMETHASONE SODIUM PHOSPHATE 10 MG/ML IJ SOLN
10.0000 mg | Freq: Once | INTRAMUSCULAR | Status: AC
  Administered 2019-07-23: 10 mg via INTRAVENOUS
  Filled 2019-07-22: qty 1

## 2019-07-22 MED ORDER — PROPOFOL 10 MG/ML IV BOLUS
INTRAVENOUS | Status: AC
  Filled 2019-07-22: qty 20

## 2019-07-22 MED ORDER — CEFAZOLIN SODIUM-DEXTROSE 2-4 GM/100ML-% IV SOLN
INTRAVENOUS | Status: AC
  Filled 2019-07-22: qty 100

## 2019-07-22 MED ORDER — FENTANYL CITRATE (PF) 100 MCG/2ML IJ SOLN
INTRAMUSCULAR | Status: DC | PRN
  Administered 2019-07-22: 100 ug via INTRAVENOUS

## 2019-07-22 MED ORDER — PHENYLEPHRINE HCL-NACL 10-0.9 MG/250ML-% IV SOLN
INTRAVENOUS | Status: DC | PRN
  Administered 2019-07-22: 40 ug/min via INTRAVENOUS

## 2019-07-22 MED ORDER — POLYETHYLENE GLYCOL 3350 17 G PO PACK: 17.0000 g | PACK | Freq: Every day | ORAL | Status: DC | PRN

## 2019-07-22 MED ORDER — DEXAMETHASONE SODIUM PHOSPHATE 10 MG/ML IJ SOLN
INTRAMUSCULAR | Status: DC | PRN
  Administered 2019-07-22: 10 mg via INTRAVENOUS

## 2019-07-22 MED ORDER — ALUM & MAG HYDROXIDE-SIMETH 200-200-20 MG/5ML PO SUSP: 30.0000 mL | ORAL | Status: DC | PRN

## 2019-07-22 MED ORDER — ONDANSETRON HCL 4 MG/2ML IJ SOLN: 4.0000 mg | Freq: Four times a day (QID) | INTRAMUSCULAR | Status: DC | PRN

## 2019-07-22 MED ORDER — CEFAZOLIN SODIUM-DEXTROSE 2-4 GM/100ML-% IV SOLN
2.0000 g | Freq: Four times a day (QID) | INTRAVENOUS | Status: AC
  Administered 2019-07-22 (×2): 2 g via INTRAVENOUS
  Filled 2019-07-22 (×2): qty 100

## 2019-07-22 MED ORDER — EPHEDRINE 5 MG/ML INJ
INTRAVENOUS | Status: AC
  Filled 2019-07-22: qty 10

## 2019-07-22 MED ORDER — ONDANSETRON HCL 4 MG/2ML IJ SOLN
INTRAMUSCULAR | Status: DC | PRN
  Administered 2019-07-22: 4 mg via INTRAVENOUS

## 2019-07-22 MED ORDER — KETOROLAC TROMETHAMINE 30 MG/ML IJ SOLN
INTRAMUSCULAR | Status: AC
  Filled 2019-07-22: qty 1

## 2019-07-22 MED ORDER — EPHEDRINE SULFATE-NACL 50-0.9 MG/10ML-% IV SOSY
PREFILLED_SYRINGE | INTRAVENOUS | Status: DC | PRN
  Administered 2019-07-22 (×2): 10 mg via INTRAVENOUS

## 2019-07-22 MED ORDER — METOCLOPRAMIDE HCL 5 MG/ML IJ SOLN: 5.0000 mg | Freq: Three times a day (TID) | INTRAMUSCULAR | Status: DC | PRN

## 2019-07-22 MED ORDER — OXYCODONE HCL 5 MG/5ML PO SOLN: 5.0000 mg | Freq: Once | ORAL | Status: DC | PRN

## 2019-07-22 MED ORDER — MIDAZOLAM HCL 5 MG/5ML IJ SOLN
INTRAMUSCULAR | Status: DC | PRN
  Administered 2019-07-22: 2 mg via INTRAVENOUS

## 2019-07-22 MED ORDER — ASPIRIN EC 325 MG PO TBEC
325.0000 mg | DELAYED_RELEASE_TABLET | Freq: Every day | ORAL | Status: DC
  Administered 2019-07-23: 325 mg via ORAL
  Filled 2019-07-22: qty 1

## 2019-07-22 MED ORDER — OXYCODONE HCL 5 MG PO TABS: 5.0000 mg | ORAL_TABLET | ORAL | Status: DC | PRN

## 2019-07-22 MED ORDER — METOCLOPRAMIDE HCL 5 MG PO TABS: 5.0000 mg | ORAL_TABLET | Freq: Three times a day (TID) | ORAL | Status: DC | PRN

## 2019-07-22 MED ORDER — POTASSIUM CHLORIDE IN NACL 20-0.45 MEQ/L-% IV SOLN
INTRAVENOUS | Status: DC
  Filled 2019-07-22 (×2): qty 1000

## 2019-07-22 MED ORDER — PHENYLEPHRINE HCL (PRESSORS) 10 MG/ML IV SOLN
INTRAVENOUS | Status: AC
  Filled 2019-07-22: qty 1

## 2019-07-22 MED ORDER — OXYCODONE HCL 5 MG PO TABS
10.0000 mg | ORAL_TABLET | ORAL | Status: DC | PRN
  Administered 2019-07-22 – 2019-07-23 (×7): 15 mg via ORAL
  Filled 2019-07-22 (×7): qty 3

## 2019-07-22 MED ORDER — CEFAZOLIN SODIUM-DEXTROSE 2-4 GM/100ML-% IV SOLN
2.0000 g | INTRAVENOUS | Status: AC
  Administered 2019-07-22: 11:00:00 2 g via INTRAVENOUS

## 2019-07-22 MED ORDER — HYDROMORPHONE HCL 1 MG/ML IJ SOLN
0.2500 mg | INTRAMUSCULAR | Status: DC | PRN
  Administered 2019-07-22 (×2): 0.5 mg via INTRAVENOUS

## 2019-07-22 MED ORDER — POVIDONE-IODINE 10 % EX SWAB: 2.0000 "application " | Freq: Once | CUTANEOUS | Status: DC

## 2019-07-22 MED ORDER — ZOLPIDEM TARTRATE 5 MG PO TABS: 5.0000 mg | ORAL_TABLET | Freq: Every evening | ORAL | Status: DC | PRN

## 2019-07-22 MED ORDER — MAGNESIUM CITRATE PO SOLN: 1.0000 | Freq: Once | ORAL | Status: DC | PRN

## 2019-07-22 MED ORDER — DIPHENHYDRAMINE HCL 12.5 MG/5ML PO ELIX: 12.5000 mg | ORAL_SOLUTION | ORAL | Status: DC | PRN

## 2019-07-22 MED ORDER — KETOROLAC TROMETHAMINE 30 MG/ML IJ SOLN
INTRAMUSCULAR | Status: DC | PRN
  Administered 2019-07-22: 30 mg

## 2019-07-22 MED ORDER — ACETAMINOPHEN 500 MG PO TABS
1000.0000 mg | ORAL_TABLET | Freq: Four times a day (QID) | ORAL | Status: AC
  Administered 2019-07-22 – 2019-07-23 (×4): 1000 mg via ORAL
  Filled 2019-07-22 (×4): qty 2

## 2019-07-22 MED ORDER — ONDANSETRON HCL 4 MG/2ML IJ SOLN
INTRAMUSCULAR | Status: AC
  Filled 2019-07-22: qty 2

## 2019-07-22 MED ORDER — TRANEXAMIC ACID-NACL 1000-0.7 MG/100ML-% IV SOLN
1000.0000 mg | Freq: Once | INTRAVENOUS | Status: AC
  Administered 2019-07-22: 1000 mg via INTRAVENOUS
  Filled 2019-07-22: qty 100

## 2019-07-22 MED ORDER — TRANEXAMIC ACID-NACL 1000-0.7 MG/100ML-% IV SOLN
INTRAVENOUS | Status: AC
  Filled 2019-07-22: qty 100

## 2019-07-22 MED ORDER — DEXAMETHASONE SODIUM PHOSPHATE 10 MG/ML IJ SOLN
INTRAMUSCULAR | Status: AC
  Filled 2019-07-22: qty 1

## 2019-07-22 MED ORDER — HYDROMORPHONE HCL 1 MG/ML IJ SOLN
0.5000 mg | INTRAMUSCULAR | Status: DC | PRN
  Administered 2019-07-22: 1 mg via INTRAVENOUS
  Filled 2019-07-22: qty 1

## 2019-07-22 MED ORDER — PROPOFOL 500 MG/50ML IV EMUL
INTRAVENOUS | Status: AC
  Filled 2019-07-22: qty 50

## 2019-07-22 MED ORDER — PROPOFOL 10 MG/ML IV BOLUS
INTRAVENOUS | Status: DC | PRN
  Administered 2019-07-22: 30 mg via INTRAVENOUS
  Administered 2019-07-22 (×3): 20 mg via INTRAVENOUS

## 2019-07-22 MED ORDER — IRBESARTAN 150 MG PO TABS
150.0000 mg | ORAL_TABLET | Freq: Every day | ORAL | Status: DC
  Administered 2019-07-23: 150 mg via ORAL
  Filled 2019-07-22: qty 1

## 2019-07-22 MED ORDER — MEPERIDINE HCL 50 MG/ML IJ SOLN: 6.2500 mg | INTRAMUSCULAR | Status: DC | PRN

## 2019-07-22 MED ORDER — BUPIVACAINE IN DEXTROSE 0.75-8.25 % IT SOLN
INTRATHECAL | Status: DC | PRN
  Administered 2019-07-22: 2 mL via INTRATHECAL

## 2019-07-22 MED ORDER — METHOCARBAMOL 500 MG IVPB - SIMPLE MED
INTRAVENOUS | Status: AC
  Filled 2019-07-22: qty 50

## 2019-07-22 MED ORDER — KETOROLAC TROMETHAMINE 15 MG/ML IJ SOLN
7.5000 mg | Freq: Four times a day (QID) | INTRAMUSCULAR | Status: AC
  Administered 2019-07-22 – 2019-07-23 (×4): 7.5 mg via INTRAVENOUS
  Filled 2019-07-22 (×4): qty 1

## 2019-07-22 MED ORDER — BUPIVACAINE HCL (PF) 0.25 % IJ SOLN
INTRAMUSCULAR | Status: AC
  Filled 2019-07-22: qty 30

## 2019-07-22 MED ORDER — CHLORHEXIDINE GLUCONATE 4 % EX LIQD: 60.0000 mL | Freq: Once | CUTANEOUS | Status: DC

## 2019-07-22 MED ORDER — PANTOPRAZOLE SODIUM 40 MG PO TBEC: 40.0000 mg | DELAYED_RELEASE_TABLET | ORAL | Status: DC | PRN

## 2019-07-22 MED ORDER — LEVOTHYROXINE SODIUM 100 MCG PO TABS
300.0000 ug | ORAL_TABLET | Freq: Every day | ORAL | Status: DC
  Administered 2019-07-23: 300 ug via ORAL
  Filled 2019-07-22: qty 3

## 2019-07-22 MED ORDER — LACTATED RINGERS IV SOLN: INTRAVENOUS | Status: DC

## 2019-07-22 MED ORDER — ONDANSETRON HCL 4 MG PO TABS: 4.0000 mg | ORAL_TABLET | Freq: Four times a day (QID) | ORAL | Status: DC | PRN

## 2019-07-22 MED ORDER — DICYCLOMINE HCL 10 MG PO CAPS
10.0000 mg | ORAL_CAPSULE | Freq: Four times a day (QID) | ORAL | Status: DC | PRN
  Filled 2019-07-22: qty 1

## 2019-07-22 MED ORDER — METHOCARBAMOL 500 MG IVPB - SIMPLE MED
500.0000 mg | Freq: Four times a day (QID) | INTRAVENOUS | Status: DC | PRN
  Administered 2019-07-22: 500 mg via INTRAVENOUS
  Filled 2019-07-22: qty 50

## 2019-07-22 MED ORDER — PROPOFOL 500 MG/50ML IV EMUL
INTRAVENOUS | Status: DC | PRN
  Administered 2019-07-22: 100 ug/kg/min via INTRAVENOUS

## 2019-07-22 MED ORDER — LORATADINE 10 MG PO TABS: 10.0000 mg | ORAL_TABLET | Freq: Every day | ORAL | Status: DC | PRN

## 2019-07-22 MED ORDER — BISACODYL 10 MG RE SUPP: 10.0000 mg | Freq: Every day | RECTAL | Status: DC | PRN

## 2019-07-22 MED ORDER — TRANEXAMIC ACID-NACL 1000-0.7 MG/100ML-% IV SOLN
1000.0000 mg | INTRAVENOUS | Status: AC
  Administered 2019-07-22: 1000 mg via INTRAVENOUS

## 2019-07-22 MED ORDER — OXYCODONE HCL 5 MG PO TABS: 5.0000 mg | ORAL_TABLET | Freq: Once | ORAL | Status: DC | PRN

## 2019-07-22 MED ORDER — MIDAZOLAM HCL 2 MG/2ML IJ SOLN
INTRAMUSCULAR | Status: AC
  Filled 2019-07-22: qty 2

## 2019-07-22 MED ORDER — BUPIVACAINE HCL (PF) 0.25 % IJ SOLN
INTRAMUSCULAR | Status: DC | PRN
  Administered 2019-07-22: 30 mL

## 2019-07-22 MED ORDER — HYDROMORPHONE HCL 1 MG/ML IJ SOLN
INTRAMUSCULAR | Status: AC
  Filled 2019-07-22: qty 1

## 2019-07-22 MED ORDER — ONDANSETRON HCL 4 MG/2ML IJ SOLN: 4.0000 mg | Freq: Once | INTRAMUSCULAR | Status: DC | PRN

## 2019-07-22 MED ORDER — ACETAMINOPHEN 500 MG PO TABS
ORAL_TABLET | ORAL | Status: AC
  Administered 2019-07-22: 09:00:00 1000 mg via ORAL
  Filled 2019-07-22: qty 2

## 2019-07-22 MED ORDER — PHENOL 1.4 % MT LIQD: 1.0000 | OROMUCOSAL | Status: DC | PRN

## 2019-07-22 MED ORDER — MECLIZINE HCL 25 MG PO TABS: 25.0000 mg | ORAL_TABLET | Freq: Three times a day (TID) | ORAL | Status: DC | PRN

## 2019-07-22 MED ORDER — METHOCARBAMOL 500 MG PO TABS
500.0000 mg | ORAL_TABLET | Freq: Four times a day (QID) | ORAL | Status: DC | PRN
  Administered 2019-07-22 – 2019-07-23 (×3): 500 mg via ORAL
  Filled 2019-07-22 (×3): qty 1

## 2019-07-22 MED ORDER — ACETAMINOPHEN 500 MG PO TABS: 1000.0000 mg | ORAL_TABLET | Freq: Once | ORAL | Status: AC

## 2019-07-22 MED ORDER — FLUTICASONE PROPIONATE 50 MCG/ACT NA SUSP
2.0000 | Freq: Every day | NASAL | Status: DC | PRN
  Filled 2019-07-22: qty 16

## 2019-07-22 MED ORDER — ACETAMINOPHEN 325 MG PO TABS: 325.0000 mg | ORAL_TABLET | Freq: Four times a day (QID) | ORAL | Status: DC | PRN

## 2019-07-22 MED ORDER — MENTHOL 3 MG MT LOZG: 1.0000 | LOZENGE | OROMUCOSAL | Status: DC | PRN

## 2019-07-22 SURGICAL SUPPLY — 56 items
BIT DRILL 2.0X128 (BIT) ×2 IMPLANT
BLADE SAW SGTL 73X25 THK (BLADE) ×2 IMPLANT
CLSR STERI-STRIP ANTIMIC 1/2X4 (GAUZE/BANDAGES/DRESSINGS) ×4 IMPLANT
COVER SURGICAL LIGHT HANDLE (MISCELLANEOUS) ×2 IMPLANT
COVER WAND RF STERILE (DRAPES) IMPLANT
DRAPE INCISE IOBAN 66X45 STRL (DRAPES) ×2 IMPLANT
DRAPE ORTHO SPLIT 77X108 STRL (DRAPES) ×4
DRAPE POUCH INSTRU U-SHP 10X18 (DRAPES) ×2 IMPLANT
DRAPE SHEET LG 3/4 BI-LAMINATE (DRAPES) ×2 IMPLANT
DRAPE SURG 17X11 SM STRL (DRAPES) ×2 IMPLANT
DRAPE SURG ORHT 6 SPLT 77X108 (DRAPES) ×2 IMPLANT
DRAPE U-SHAPE 47X51 STRL (DRAPES) ×2 IMPLANT
DRSG MEPILEX BORDER 4X8 (GAUZE/BANDAGES/DRESSINGS) ×2 IMPLANT
DURAPREP 26ML APPLICATOR (WOUND CARE) ×4 IMPLANT
ELECT BLADE TIP CTD 4 INCH (ELECTRODE) ×2 IMPLANT
ELECT REM PT RETURN 15FT ADLT (MISCELLANEOUS) ×2 IMPLANT
ELIMINATOR HOLE APEX DEPUY (Hips) ×1 IMPLANT
FACESHIELD WRAPAROUND (MASK) IMPLANT
FACESHIELD WRAPAROUND OR TEAM (MASK) ×1 IMPLANT
GLOVE BIO SURGEON STRL SZ7 (GLOVE) ×2 IMPLANT
GLOVE BIO SURGEON STRL SZ7.5 (GLOVE) ×2 IMPLANT
GLOVE BIOGEL PI IND STRL 7.0 (GLOVE) ×1 IMPLANT
GLOVE BIOGEL PI IND STRL 8 (GLOVE) ×1 IMPLANT
GLOVE BIOGEL PI INDICATOR 7.0 (GLOVE) ×1
GLOVE BIOGEL PI INDICATOR 8 (GLOVE) ×1
GOWN STRL REUS W/TWL LRG LVL3 (GOWN DISPOSABLE) ×4 IMPLANT
HEAD CERAMIC DELTA 36 PLUS 1.5 (Hips) ×1 IMPLANT
HOOD PEEL AWAY FLYTE STAYCOOL (MISCELLANEOUS) ×6 IMPLANT
KIT BASIN OR (CUSTOM PROCEDURE TRAY) ×2 IMPLANT
KIT TURNOVER KIT A (KITS) ×2 IMPLANT
LINER NEUTRAL 52X36MM PLUS 4 (Liner) ×1 IMPLANT
MANIFOLD NEPTUNE II (INSTRUMENTS) ×2 IMPLANT
NDL SAFETY ECLIPSE 18X1.5 (NEEDLE) ×2 IMPLANT
NEEDLE HYPO 18GX1.5 SHARP (NEEDLE) ×4
NS IRRIG 1000ML POUR BTL (IV SOLUTION) ×2 IMPLANT
PACK TOTAL JOINT (CUSTOM PROCEDURE TRAY) ×2 IMPLANT
PENCIL SMOKE EVACUATOR (MISCELLANEOUS) IMPLANT
PIN SECTOR W/GRIP ACE CUP 52MM (Hips) ×1 IMPLANT
PROTECTOR NERVE ULNAR (MISCELLANEOUS) ×2 IMPLANT
RETRIEVER SUT HEWSON (MISCELLANEOUS) ×2 IMPLANT
STRIP CLOSURE SKIN 1/2X4 (GAUZE/BANDAGES/DRESSINGS) ×1 IMPLANT
SUCTION FRAZIER HANDLE 12FR (TUBING) ×2
SUCTION TUBE FRAZIER 12FR DISP (TUBING) ×1 IMPLANT
SUT FIBERWIRE #2 38 REV NDL BL (SUTURE) ×6
SUT VIC AB 0 CT1 36 (SUTURE) ×2 IMPLANT
SUT VIC AB 1 CT1 36 (SUTURE) ×4 IMPLANT
SUT VIC AB 2-0 CT1 27 (SUTURE) ×4
SUT VIC AB 2-0 CT1 TAPERPNT 27 (SUTURE) ×2 IMPLANT
SUT VIC AB 3-0 SH 8-18 (SUTURE) ×2 IMPLANT
SUTURE FIBERWR#2 38 REV NDL BL (SUTURE) ×3 IMPLANT
SYR CONTROL 10ML LL (SYRINGE) ×4 IMPLANT
TAP DUOFIX SZ5 STD OFF (Hips) ×1 IMPLANT
TOWEL OR 17X26 10 PK STRL BLUE (TOWEL DISPOSABLE) ×3 IMPLANT
TRAY FOLEY MTR SLVR 16FR STAT (SET/KITS/TRAYS/PACK) ×2 IMPLANT
WATER STERILE IRR 1000ML POUR (IV SOLUTION) ×4 IMPLANT
YANKAUER SUCT BULB TIP 10FT TU (MISCELLANEOUS) ×2 IMPLANT

## 2019-07-22 NOTE — H&P (Signed)
PREOPERATIVE H&P  Chief Complaint: Left hip primary localized osteoarthritis  HPI: Andrew Love is a 54 y.o. male who presents for preoperative history and physical with a diagnosis of left hip primary localized osteoarthritis. Symptoms are rated as moderate to severe, and have been worsening.  This is significantly impairing activities of daily living.  He has elected for surgical management.   He has failed injections, activity modification, anti-inflammatories, and assistive devices.  Preoperative X-rays demonstrate end stage degenerative changes with osteophyte formation, loss of joint space, subchondral sclerosis.   Past Medical History:  Diagnosis Date  . Arthritis   . Back pain    chronic  . Bronchitis    history of  . CAD (coronary artery disease)   . Chronic back pain   . Chronic insomnia    Per medical history form dated 06/13/10.  . Chronic left hip pain   . Colitis    Per medical history from dated 06/13/10.  . Diverticulitis    Hx of; requiring 3 admissions  . Elevated liver enzymes   . GERD (gastroesophageal reflux disease)   . Hypertension   . Hypothyroidism   . Left leg pain    chronic  . 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 Pneumonia 02/2019  . Psoriasis    Per medical history form dated 06/13/10.  . Sigmoid colon ulcer    Rectal polyps  . Sleep apnea with use of continuous positive airway pressure (CPAP)   . Urticaria    Past Surgical History:  Procedure Laterality Date  . APPENDECTOMY     8th grade  . BACK SURGERY    . CARDIAC CATHETERIZATION  2009  . CARDIAC CATHETERIZATION     Per medical history from dated 06/13/10.  . CERVICAL SPINE SURGERY     C4, C5, C6 spinal fusion  . CHOLECYSTECTOMY N/A 08/31/2015   Procedure: LAPAROSCOPIC CHOLECYSTECTOMY WITH INTRAOPERATIVE CHOLANGIOGRAM;  Surgeon: Excell Seltzer, MD;  Location: [facility];  Service: General;  Laterality: N/A;  . COLONOSCOPY  07/2008   Colitis,NSAID v.  Ischemia,malrotation of the gut,Diverticulosis(L),hyperplastic  . COLONOSCOPY N/A 12/13/2012   Dr. Oneida Alar: Normal TI, mild sigmoid diverticulosis, hemorrhoids, 2 polyps (tubular adenoma). Random colon bx negative. Next TCS 12/2022 with Fentanyl/phenergan  . ESOPHAGOGASTRODUODENOSCOPY N/A 04/16/2014   RMR: Erosive reflux esophagitis. Non critical Schzki's ring not manipulated. Small hiatal hernia. Abnormal gastirc mucosa of doubtful signigicance status post biopsy. I suspect trivial upper GI bleed. Recent abdominal pain presumably secondary to a protracted bout of diverticulitis. CT scan November 23 revealed improvemetn without complication. He finished his antibiotics 2 days age. Left sided ab  . JOINT REPLACEMENT    . LAPAROSCOPIC SIGMOID COLECTOMY  2012   Dr. Fanny Skates: recurrent sigmoid diverticulitis  . LAPAROSCOPIC SIGMOID COLECTOMY  2012   recurernt sigmoid diverticulitis, Dr. Dalbert Batman   . ROTATOR CUFF REPAIR     Left - per medical history form dated 06/13/10.  Marland Kitchen SHOULDER SURGERY     Left  . THYROIDECTOMY     Per medical history form dated 06/13/10.  Marland Kitchen TOTAL HIP ARTHROPLASTY  07/04/2011   Procedure: TOTAL HIP ARTHROPLASTY;  Surgeon: Johnny Bridge, MD;  Location: [facility];  Service: Orthopedics;  Laterality: Right;   Social History   Socioeconomic History  . Marital status: Married    Spouse name: Not on file  . Number of children: Not on file  . Years of education: Not on file  . Highest education level: Not on file  Occupational History  . 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 Smoking status: Former Smoker    Packs/day: 0.50    Years: 18.00    Pack years: 9.00    Types: Cigarettes    Quit date: 02/15/2019    Years since quitting: 0.4  . Smokeless tobacco: Never Used  . Tobacco comment:    Substance and Sexual Activity  . Alcohol use: No    Alcohol/week: 0.0 standard drinks  . Drug use: No  . Sexual activity: Not on file  Other Topics Concern  . 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   Married   Social Determinants of Radio broadcast assistant Strain:   . Difficulty of Paying Living Expenses:   Food Insecurity:   . Worried About Charity fundraiser in the Last Year:   . Arboriculturist in the Last Year:   Transportation Needs:   . Film/video editor (Medical):   Marland Kitchen Lack of Transportation (Non-Medical):   Physical Activity:   . Days of Exercise per Week:   . Minutes of Exercise per Session:   Stress:   . Feeling of Stress :   Social Connections:   . Frequency of Communication with Friends and Family:   . Frequency of Social Gatherings with Friends and Family:   . Attends Religious Services:   . Active Member of Clubs or Organizations:   . Attends Archivist Meetings:   Marland Kitchen Marital Status:    Family History  Problem Relation Age of Onset  . Cancer Mother        breast cancer - per medical history form dated 06/13/10.  . Diverticulitis Mother   . Hypertension Mother   . Cancer Father        skin - per medical history form dated 06/13/10.  Marland Kitchen Hypertension Father   . Anesthesia problems Neg Hx   . Hypotension Neg Hx   . Malignant hyperthermia Neg Hx   . Pseudochol deficiency Neg Hx   . Colon cancer Neg Hx    Allergies  Allergen Reactions  . Other Hives and Shortness Of Breath    Cockroaches  . Iohexol Hives  . Oysters [Shellfish Allergy] Hives  . Wheat Bran Hives  . Yeast-Related Products Hives   Prior to Admission medications   Medication Sig Start Date End Date Taking? Authorizing Provider  cetirizine (ZYRTEC) 10 MG chewable tablet Chew 1 tablet (10 mg total) by mouth daily. Patient taking differently: Chew 10 mg by mouth daily as needed for allergies.  02/26/19  Yes Wurst, [place], PA-C  dexlansoprazole (DEXILANT) 60 MG capsule Take 1 capsule (60 mg total) by mouth daily before breakfast. Patient taking differently: Take 60 mg by mouth daily as needed (heart  burn).  04/18/19  Yes Mahala Menghini, PA-C  fluticasone (FLONASE) 50 MCG/ACT nasal spray Place 2 sprays into both nostrils daily. Patient taking differently: Place 2 sprays into both nostrils daily as needed for allergies.  02/26/19  Yes Wurst, [place], PA-C  levothyroxine (SYNTHROID, LEVOTHROID) 300 MCG tablet Take 300 mcg by mouth every morning.  03/26/14  Yes [provider]  olmesartan (BENICAR) 20 MG tablet Take 20 mg by mouth daily.    Yes [provider]  oxyCODONE-acetaminophen (PERCOCET) 10-325 MG tablet Take 1 tablet by mouth every 6 (six) hours as needed for pain. 04/01/19  Yes [provider]  zolpidem (AMBIEN) 10 MG tablet Take  10 mg by mouth at bedtime.    Yes [provider]  acetaminophen (TYLENOL) 500 MG tablet Take 500-1,000 mg by mouth every 6 (six) hours as needed for mild pain or fever.    [provider]  amoxicillin-clavulanate (AUGMENTIN) 875-125 MG tablet Take 1 tablet by mouth 2 (two) times daily. One po bid x 7 days Patient not taking: Reported on 07/09/2019 04/10/19   Jacqlyn Larsen, PA-C  benzonatate (TESSALON) 100 MG capsule Take 1 capsule (100 mg total) by mouth every 8 (eight) hours. Patient not taking: Reported on 07/09/2019 02/26/19   Wurst, [place], PA-C  dicyclomine (BENTYL) 10 MG capsule Take 1 capsule (10 mg total) by mouth 4 (four) times daily -  before meals and at bedtime. As needed for loose stool, abdominal cramping. Hold if constipation. 04/18/19   Mahala Menghini, PA-C  EPINEPHrine (EPIPEN) 0.3 mg/0.3 mL SOAJ Inject 0.3 mLs (0.3 mg total) into the muscle as needed. Patient taking differently: Inject 0.3 mg into the muscle as needed for anaphylaxis.  12/01/12   Teressa Lower, MD  meclizine (ANTIVERT) 25 MG tablet Take 1 tablet (25 mg total) by mouth 3 (three) times daily as needed for dizziness. 04/09/17   Isla Pence, MD  ondansetron (ZOFRAN ODT) 4 MG disintegrating tablet 4mg  ODT q4 hours prn  nausea/vomit Patient not taking: Reported on 07/15/2019 04/10/19   Jacqlyn Larsen, PA-C  ondansetron (ZOFRAN) 4 MG tablet Take 1 tablet (4 mg total) by mouth every 6 (six) hours. Patient taking differently: Take 4 mg by mouth every 6 (six) hours as needed for nausea or vomiting.  01/07/19   Wurst, [place], PA-C  promethazine (PHENERGAN) 25 MG tablet Take 1 tablet (25 mg total) by mouth every 6 (six) hours as needed for nausea or vomiting. 04/10/19   Jacqlyn Larsen, PA-C     Positive ROS: All other systems have been reviewed and were otherwise negative with the exception of those mentioned in the HPI and as above.  Physical Exam: General: Alert, no acute distress Cardiovascular: No pedal edema Respiratory: No cyanosis, no use of accessory musculature GI: No organomegaly, abdomen is soft and non-tender Skin: No lesions in the area of chief complaint Neurologic: Sensation intact distally Psychiatric: Patient is competent for consent with normal mood and affect Lymphatic: No axillary or cervical lymphadenopathy  MUSCULOSKELETAL: Left hip has extreme limited internal rotation, EHL is intact, painful arc of motion 0 to 75 degrees at most.  Assessment: Left hip primary localized osteoarthritis   Plan: Plan for Procedure(s): TOTAL HIP ARTHROPLASTY  The risks benefits and alternatives were discussed with the patient including but not limited to the risks of nonoperative treatment, versus surgical intervention including infection, bleeding, nerve injury, periprosthetic fracture, the need for revision surgery, dislocation, leg length discrepancy, blood clots, cardiopulmonary complications, morbidity, mortality, among others, and they were willing to proceed.      Patient's anticipated LOS is less than 2 midnights, meeting these requirements: - Younger than 1 - Lives within 1 hour of care - Has a competent adult at home to recover with post-op recover - NO history of  - Chronic pain requiring  opiods  - Diabetes  - Coronary Artery Disease  - Heart failure  - Heart attack  - Stroke  - DVT/VTE  - Cardiac arrhythmia  - Respiratory Failure/COPD  - Renal failure  - Anemia  - Advanced Liver disease        Johnny Bridge, MD Cell [phone]  07/22/2019 10:02 AM

## 2019-07-22 NOTE — Plan of Care (Signed)
  Problem: Education: Goal: Knowledge of the prescribed therapeutic regimen will improve Outcome: Progressing Goal: Understanding of discharge needs will improve Outcome: Progressing   Problem: Pain Management: Goal: Pain level will decrease with appropriate interventions Outcome: Progressing   

## 2019-07-22 NOTE — Op Note (Signed)
07/22/2019  12:47 PM  PATIENT:  Andrew Love   MRN: [id number]  PRE-OPERATIVE DIAGNOSIS: Left hip primary localized osteoarthritis  POST-OPERATIVE DIAGNOSIS:  same  PROCEDURE:  Procedure(s): LEFT TOTAL HIP ARTHROPLASTY  PREOPERATIVE INDICATIONS:    Andrew Love is an 54 y.o. male who has a diagnosis of left hip primary localized osteoarthritis and elected for surgical management after failing conservative treatment.  The risks benefits and alternatives were discussed with the patient including but not limited to the risks of nonoperative treatment, versus surgical intervention including infection, bleeding, nerve injury, periprosthetic fracture, the need for revision surgery, dislocation, leg length discrepancy, blood clots, cardiopulmonary complications, morbidity, mortality, among others, and they were willing to proceed.     OPERATIVE REPORT     SURGEON:  Marchia Bond, MD    ASSISTANT:  Merlene Pulling, PA-C, (Present throughout the entire procedure,  necessary for completion of procedure in a timely manner, assisting with retraction, instrumentation, and closure)     ANESTHESIA: Regional  ESTIMATED BLOOD LOSS: 948 mL    COMPLICATIONS:  None.     UNIQUE ASPECTS OF THE CASE: Access to the acetabulum was a little bit challenging, I cut the femoral neck twice in order to gain appropriate position.  Ultimately had excellent fixation on the cup, his other side was 54, but I had significantly good preparation with a 52.  I did not feel that I needed a screw.  Additionally he was very stable posteriorly, such that I did not feel that a posterior lip was necessary.  I had a slight bit of the anterior wall showing at the completion of the insertion of the cup, and was appropriately anteverted.  I was matching superiorly.  Access to the posterior lateral trochanter was a little bit challenging, but I was ultimately able to get around the corner.  Soft tissue closure at the completion  of the case was excellent.  COMPONENTS:  Depuy [employer] fit femur size 5 with a 36 mm + 1 ceramic head ball and a Gription Acetabular shell size 52, an apex hole eliminator and a +4 neutral polyethylene liner.    PROCEDURE IN DETAIL:   The patient was met in the holding area and  identified.  The appropriate hip was identified and marked at the operative site.  The patient was then transported to the OR  and  placed under anesthesia.  At that point, the patient was  placed in the lateral decubitus position with the operative side up and  secured to the operating room table and all bony prominences padded.     The operative lower extremity was prepped from the iliac crest to the distal leg.  Sterile draping was performed.  Time out was performed prior to incision.      A routine posterolateral approach was utilized via sharp dissection  carried down to the subcutaneous tissue.  Gross bleeders were Bovie coagulated.  The iliotibial band was identified and incised along the length of the skin incision.  Self-retaining retractors were  inserted.  With the hip internally rotated, the short external rotators  were identified. The piriformis and capsule was tagged with FiberWire, and the hip capsule released in a T-type fashion.  The femoral neck was exposed, and I resected the femoral neck using the appropriate jig. This was performed at approximately a thumb's breadth above the lesser trochanter.    I then exposed the deep acetabulum, cleared out any tissue including the ligamentum teres.  A  wing retractor was placed.  After adequate visualization, I excised the labrum, and then sequentially reamed.  I placed the trial acetabulum, which seated nicely, and then impacted the real cup into place.  Appropriate version and inclination was confirmed clinically matching their bony anatomy, and also with the use of the jig.  I placed a cancellous screw to augment fixation.  A trial polyethylene liner  was placed and the wing retractor removed.    I then prepared the proximal femur using the cookie-cutter, the lateralizing reamer, and then sequentially reamed and broached.  A trial broach, neck, and head was utilized, and I reduced the hip and it was found to have excellent stability with functional range of motion. The trial components were then removed, and the real polyethylene liner was placed.  I then impacted the real femoral prosthesis into place into the appropriate version, slightly anteverted to the normal anatomy, and I impacted the real head ball into place. The hip was then reduced and taken through functional range of motion and found to have excellent stability. Leg lengths were restored.  I then used a 2 mm drill bits to pass the FiberWire suture from the capsule and piriformis through the greater trochanter, and secured this. Excellent posterior capsular repair was achieved. I also closed the T in the capsule.  I then irrigated the hip copiously again with pulse lavage, and repaired the fascia with Vicryl, followed by Vicryl for the subcutaneous tissue, Monocryl for the skin, Steri-Strips and sterile gauze. The wounds were injected. The patient was then awakened and returned to PACU in stable and satisfactory condition. There were no complications.  Marchia Bond, MD Orthopedic Surgeon [phone]   07/22/2019 12:47 PM

## 2019-07-22 NOTE — Evaluation (Signed)
Physical Therapy Evaluation Patient Details Name: Andrew Love MRN: [id number] DOB: 1965/08/07 Today's Date: 07/22/2019   History of Present Illness  Pt is 54 yo male s/p L posterior THA on 07/22/19.  Pt with PMH including R THA, arthritis, back surgery, neck surgery, CAD, GERD, HTN, hypothyroidism.  Clinical Impression  Pt is s/p L Posterior THA resulting in the deficits listed below (see PT Problem List). Pt had spinal anesthesia with surgery - he is demonstrating 5/5 strength throughout R LE and strength as expected post-op on L LE (see below); however, does still have decreased sensation and proprioception.  Pt wanting to get OOB and has good upper body strength for use of RW.  Pt was able to transfer with min A and took small steps to the chair with min A and RW.  Further ambulation held due to decreased sensation/proprioception and unsafe to ambulate further.   Pt required cues for hip precautions with transfers. Expected to progress well as effects of spinal anesthesia decreased. Pt will benefit from skilled PT to increase their independence and safety with mobility to allow discharge to the venue listed below.      Follow Up Recommendations Follow surgeon's recommendation for DC plan and follow-up therapies;Supervision/Assistance - 24 hour    Equipment Recommendations  None recommended by PT;Other (comment)(pt has or has ordered DME)    Recommendations for Other Services       Precautions / Restrictions Precautions Precautions: Fall;Posterior Hip Precaution Booklet Issued: Yes (comment) Restrictions Weight Bearing Restrictions: Yes LLE Weight Bearing: Weight bearing as tolerated      Mobility  Bed Mobility Overal bed mobility: Needs Assistance Bed Mobility: Supine to Sit     Supine to sit: Min assist;HOB elevated     General bed mobility comments: cued for hip precautions with transfers; required min A for L LE and to boost trunk  Transfers Overall transfer level:  Needs assistance Equipment used: Rolling walker (2 wheeled) Transfers: Sit to/from Stand Sit to Stand: Min guard;From elevated surface         General transfer comment: cues for safe hand placement and hip precautions with standing and sitting  Ambulation/Gait Ambulation/Gait assistance: Min guard Gait Distance (Feet): 2 Feet Assistive device: Rolling walker (2 wheeled) Gait Pattern/deviations: Decreased stride length;Shuffle Gait velocity: decreased   General Gait Details: Only took small steps to pivot to chair -  limited due to still has decreased sensation from spinal, unsafe to move further than transfer to chair.  Stairs            Wheelchair Mobility    Modified Rankin (Stroke Patients Only)       Balance Overall balance assessment: Needs assistance   Sitting balance-Leahy Scale: Normal     Standing balance support: Bilateral upper extremity supported;During functional activity Standing balance-Leahy Scale: Poor Standing balance comment: reliant on RW - again limited due to decreased sensation                             Pertinent Vitals/Pain Pain Assessment: 0-10 Pain Score: 3  Pain Location: L hip Pain Descriptors / Indicators: Discomfort Pain Intervention(s): Limited activity within patient's tolerance;Monitored during session;Repositioned;Ice applied    Home Living Family/patient expects to be discharged to:: Private residence Living Arrangements: Spouse/significant other Available Help at Discharge: Family;Available 24 hours/day Type of Home: House Home Access: Level entry     Home Layout: One level Home Equipment: Walker - 2 wheels(ordered [facility]  and shower chair)      Prior Function Level of Independence: Independent         Comments: 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  Active - likes to play golf.     Hand Dominance        Extremity/Trunk Assessment   Upper Extremity Assessment Upper  Extremity Assessment: Overall WFL for tasks assessed    Lower Extremity Assessment Lower Extremity Assessment: LLE deficits/detail;RLE deficits/detail RLE Deficits / Details: ROM WFL: MMT 5/5  RLE Sensation: decreased light touch;decreased proprioception(spinal anesthesia) LLE Deficits / Details: ROM - WFL within posterior hip precautions; MMT ankle DF 4+/5 (still limited due to spinal), knee 3/5 (not further tested due to pain), hip 1/5 LLE Sensation: decreased light touch;decreased proprioception(spinal anesthesia)    Cervical / Trunk Assessment Cervical / Trunk Assessment: Normal  Communication   Communication: No difficulties  Cognition Arousal/Alertness: Awake/alert Behavior During Therapy: WFL for tasks assessed/performed Overall Cognitive Status: Within Functional Limits for tasks assessed                                        General Comments General comments (skin integrity, edema, etc.): VSS : pt was on 2 LPM O2 at arrival with sats 96%, switched to RA and maintained 94%.  Left on RA, with continuous pulse ox and RN aware.    Exercises Total Joint Exercises Ankle Circles/Pumps: AROM;Both;10 reps   Assessment/Plan    PT Assessment Patient needs continued PT services  PT Problem List Decreased strength;Decreased mobility;Decreased coordination;Decreased knowledge of precautions;Decreased activity tolerance;Decreased balance;Decreased knowledge of use of DME       PT Treatment Interventions DME instruction;Therapeutic activities;Modalities;Gait training;Therapeutic exercise;Patient/family education;Stair training;Balance training;Functional mobility training    PT Goals (Current goals can be found in the Care Plan section)  Acute Rehab PT Goals Patient Stated Goal: return home PT Goal Formulation: With patient/family Time For Goal Achievement: 08/05/19    Frequency 7X/week   Barriers to discharge        Co-evaluation                AM-PAC PT "6 Clicks" Mobility  Outcome Measure Help needed turning from your back to your side while in a flat bed without using bedrails?: A Little Help needed moving from lying on your back to sitting on the side of a flat bed without using bedrails?: A Little Help needed moving to and from a bed to a chair (including a wheelchair)?: A Little Help needed standing up from a chair using your arms (e.g., wheelchair or bedside chair)?: A Little Help needed to walk in hospital room?: A Lot Help needed climbing 3-5 steps with a railing? : A Lot 6 Click Score: 16    End of Session Equipment Utilized During Treatment: Gait belt Activity Tolerance: Other (comment)(limited due to decreased sensation from spinal) Patient left: with chair alarm set;in chair;with family/visitor present;with call bell/phone within reach Nurse Communication: Mobility status(discussed pt on RA and still feeling effects of spinal so only transferred to chair) PT Visit Diagnosis: Unsteadiness on feet (R26.81);Muscle weakness (generalized) (M62.81)    Time: 1710-1736 PT Time Calculation (min) (ACUTE ONLY): 26 min   Charges:   PT Evaluation $PT Eval Moderate Complexity: 1 Mod          Maggie Font, PT Acute Rehab Services Pager [phone] [facility]  [phone]   Karlton Lemon 07/22/2019, 5:53 PM

## 2019-07-22 NOTE — Progress Notes (Signed)
Pt declined use of cpap tonight.  Pt prefers to wear nasal cannula (2lpm) instead.  Pt was advised that RT is available all night should he change his mind.

## 2019-07-22 NOTE — Transfer of Care (Signed)
Immediate Anesthesia Transfer of Care Note  Patient: JULUS RANCOURT  Procedure(s) Performed: TOTAL HIP ARTHROPLASTY (Left Hip)  Patient Location: PACU  Anesthesia Type:Spinal  Level of Consciousness: drowsy  Airway & Oxygen Therapy: Patient Spontanous Breathing and Patient connected to face mask oxygen  Post-op Assessment: Report given to RN and Post -op Vital signs reviewed and stable  Post vital signs: Reviewed and stable  Last Vitals:  Vitals Value Taken Time  BP 126/84 07/22/19 1325  Temp    Pulse 71 07/22/19 1327  Resp 17 07/22/19 1327  SpO2 100 % 07/22/19 1327  Vitals shown include unvalidated device data.  Last Pain:  Vitals:   07/22/19 0827  TempSrc: Oral         Complications: No apparent anesthesia complications

## 2019-07-22 NOTE — Anesthesia Procedure Notes (Signed)
Date/Time: 07/22/2019 10:42 AM Performed by: Sharlette Dense, CRNA Oxygen Delivery Method: Simple face mask

## 2019-07-22 NOTE — Anesthesia Procedure Notes (Signed)
Spinal  Patient location during procedure: OR Start time: 07/22/2019 10:43 AM End time: 07/22/2019 10:46 AM Staffing Performed: anesthesiologist  Anesthesiologist: Josephine Igo, MD Preanesthetic Checklist Completed: patient identified, IV checked, site marked, risks and benefits discussed, surgical consent, monitors and equipment checked, pre-op evaluation and timeout performed Spinal Block Patient position: sitting Prep: DuraPrep and site prepped and draped Patient monitoring: heart rate, cardiac monitor, continuous pulse ox and blood pressure Approach: midline Location: L3-4 Injection technique: single-shot Needle Needle type: Pencan  Needle gauge: 24 G Needle length: 9 cm Needle insertion depth: 7 cm Assessment Sensory level: T4 Additional Notes Patient tolerated procedure well. Adequate sensory level.

## 2019-07-22 NOTE — Anesthesia Preprocedure Evaluation (Signed)
Anesthesia Evaluation    Airway Mallampati: II  TM Distance: >3 FB Neck ROM: Full    Dental no notable dental hx. (+) Teeth Intact   Pulmonary sleep apnea and Continuous Positive Airway Pressure Ventilation , pneumonia, resolved, former smoker,  Covid 03/2019   Pulmonary exam normal breath sounds clear to auscultation       Cardiovascular hypertension, Pt. on medications + CAD  Normal cardiovascular exam Rhythm:Regular Rate:Normal     Neuro/Psych TIA Neuromuscular disease CVA, No Residual Symptoms negative psych ROS   GI/Hepatic Neg liver ROS, PUD, GERD  Medicated and Controlled,  Endo/Other  Hypothyroidism Hyperlipidemia Obesity  Renal/GU Renal diseaseHx/o AKI  negative genitourinary   Musculoskeletal  (+) Arthritis , Osteoarthritis,  DJD left hip Chronic low back pain   Abdominal (+) + obese,   Peds  Hematology negative hematology ROS (+)   Anesthesia Other Findings   Reproductive/Obstetrics                             Anesthesia Physical Anesthesia Plan  ASA: III  Anesthesia Plan: Spinal   Post-op Pain Management:    Induction: Intravenous  PONV Risk Score and Plan: 3 and Ondansetron, Propofol infusion and Treatment may vary due to age or medical condition  Airway Management Planned: Natural Airway and Simple Face Mask  Additional Equipment:   Intra-op Plan:   Post-operative Plan:   Informed Consent: I have reviewed the patients History and Physical, chart, labs and discussed the procedure including the risks, benefits and alternatives for the proposed anesthesia with the patient or authorized representative who has indicated his/her understanding and acceptance.     Dental advisory given  Plan Discussed with: CRNA and Surgeon  Anesthesia Plan Comments:         Anesthesia Quick Evaluation

## 2019-07-23 ENCOUNTER — Encounter (HOSPITAL_COMMUNITY): Payer: Self-pay | Admitting: Orthopedic Surgery

## 2019-07-23 DIAGNOSIS — M1612 Unilateral primary osteoarthritis, left hip: Secondary | ICD-10-CM | POA: Diagnosis not present

## 2019-07-23 LAB — CBC
HCT: 41 % (ref 39.0–52.0)
Hemoglobin: 14.4 g/dL (ref 13.0–17.0)
MCH: 32 pg (ref 26.0–34.0)
MCHC: 35.1 g/dL (ref 30.0–36.0)
MCV: 91.1 fL (ref 80.0–100.0)
Platelets: 225 10*3/uL (ref 150–400)
RBC: 4.5 MIL/uL (ref 4.22–5.81)
RDW: 12.9 % (ref 11.5–15.5)
WBC: 17.9 10*3/uL — ABNORMAL HIGH (ref 4.0–10.5)
nRBC: 0 % (ref 0.0–0.2)

## 2019-07-23 LAB — BASIC METABOLIC PANEL
Anion gap: 13 (ref 5–15)
BUN: 13 mg/dL (ref 6–20)
CO2: 21 mmol/L — ABNORMAL LOW (ref 22–32)
Calcium: 8.8 mg/dL — ABNORMAL LOW (ref 8.9–10.3)
Chloride: 103 mmol/L (ref 98–111)
Creatinine, Ser: 1.15 mg/dL (ref 0.61–1.24)
GFR calc Af Amer: >60 mL/min (ref >60)
GFR calc non Af Amer: >60 mL/min (ref >60)
Glucose, Bld: 159 mg/dL — ABNORMAL HIGH (ref 70–99)
Potassium: 4.4 mmol/L (ref 3.5–5.1)
Sodium: 137 mmol/L (ref 135–145)

## 2019-07-23 MED ORDER — HYDROMORPHONE HCL 2 MG PO TABS: 2.0000 mg | ORAL_TABLET | ORAL | 0 refills | Status: DC | PRN

## 2019-07-23 MED ORDER — ASPIRIN EC 325 MG PO TBEC: 325.0000 mg | DELAYED_RELEASE_TABLET | Freq: Two times a day (BID) | ORAL | 0 refills | Status: DC

## 2019-07-23 MED ORDER — BACLOFEN 10 MG PO TABS: 10.0000 mg | ORAL_TABLET | Freq: Three times a day (TID) | ORAL | 0 refills | Status: DC

## 2019-07-23 MED ORDER — SENNA-DOCUSATE SODIUM 8.6-50 MG PO TABS: 2.0000 | ORAL_TABLET | Freq: Every day | ORAL | 1 refills | Status: DC

## 2019-07-23 NOTE — Anesthesia Postprocedure Evaluation (Signed)
Anesthesia Post Note  Patient: Andrew Love  Procedure(s) Performed: TOTAL HIP ARTHROPLASTY (Left Hip)     Patient location during evaluation: PACU Anesthesia Type: Spinal Level of consciousness: awake and alert Pain management: pain level controlled Vital Signs Assessment: post-procedure vital signs reviewed and stable Respiratory status: spontaneous breathing and respiratory function stable Cardiovascular status: blood pressure returned to baseline and stable Postop Assessment: spinal receding and no apparent nausea or vomiting Anesthetic complications: no    Last Vitals:  Vitals:   07/23/19 0144 07/23/19 0613  BP: 125/78 123/78  Pulse: 70 67  Resp: 14 16  Temp: 36.9 C 36.7 C  SpO2: 96% 95%    Last Pain:  Vitals:   07/23/19 0740  TempSrc:   PainSc: [place]

## 2019-07-23 NOTE — Progress Notes (Signed)
Physical Therapy Treatment Patient Details Name: Andrew Love MRN: [id number] DOB: [birth year] Today's Date: 07/23/2019    History of Present Illness Pt is 54 yo male s/p L posterior THA on 07/22/19.  Pt with PMH including R THA, arthritis, back surgery, neck surgery, CAD, GERD, HTN, hypothyroidism.    PT Comments    POD # 1 pm session.  Assisted from recliner to amb to bathroom.  General transfer comment: 25% VC's on proper hand placement and to avoid hip flex > 90. General Gait Details: tolerated an increased distance.  Good step to gait feeling 'better".  No stairs to enter home. Then returned to room to perform some TE's following HEP handout.  Instructed on proper tech, freq as well as use of ICE.   Addressed all mobility questions, discussed appropriate activity, educated on use of ICE.  Pt ready for D/C to home.    Follow Up Recommendations  Supervision/Assistance - 24 hour(HEP)     Equipment Recommendations  None recommended by PT    Recommendations for Other Services       Precautions / Restrictions Precautions Precautions: Fall;Posterior Hip Precaution Comments: pt able to recal 3/3 THP Restrictions Weight Bearing Restrictions: No LLE Weight Bearing: Weight bearing as tolerated    Mobility  Bed Mobility     General bed mobility comments: pt was OOB in recliner  Transfers Overall transfer level: Needs assistance Equipment used: Rolling walker (2 wheeled) Transfers: Sit to/from Stand Sit to Stand: Supervision         General transfer comment: 25% VC's on proper hand placement and to avoid hip flex > 90  Ambulation/Gait Ambulation/Gait assistance: Supervision Gait Distance (Feet): 155 Feet Assistive device: Rolling walker (2 wheeled) Gait Pattern/deviations: Step-to pattern;Decreased stance time - left Gait velocity: decreased   General Gait Details: tolerated an increased distance.  Good step to gait feeling 'better".  No stairs to enter  home.   Stairs             Wheelchair Mobility    Modified Rankin (Stroke Patients Only)       Balance                                            Cognition Arousal/Alertness: Awake/alert Behavior During Therapy: WFL for tasks assessed/performed Overall Cognitive Status: Within Functional Limits for tasks assessed                                 General Comments: AxO x 4 very motivated.  Had previous THP 2013 so knowledgable      Exercises  5 reps all standing TE's with walker following HEP.      General Comments        Pertinent Vitals/Pain Pain Assessment: 0-10 Pain Score: 5  Pain Location: L hip Pain Descriptors / Indicators: Tightness;Tender Pain Intervention(s): Premedicated before session;Monitored during session;Repositioned;Ice applied    Home Living                      Prior Function            PT Goals (current goals can now be found in the care plan section) Progress towards PT goals: Progressing toward goals    Frequency    7X/week      PT Plan Current  plan remains appropriate    Co-evaluation              AM-PAC PT "6 Clicks" Mobility   Outcome Measure  Help needed turning from your back to your side while in a flat bed without using bedrails?: None Help needed moving from lying on your back to sitting on the side of a flat bed without using bedrails?: None Help needed moving to and from a bed to a chair (including a wheelchair)?: None Help needed standing up from a chair using your arms (e.g., wheelchair or bedside chair)?: A Little Help needed to walk in hospital room?: A Little Help needed climbing 3-5 steps with a railing? : A Little 6 Click Score: 21    End of Session Equipment Utilized During Treatment: Gait belt Activity Tolerance: Patient tolerated treatment well Patient left: with chair alarm set;in chair;with family/visitor present;with call bell/phone within  reach Nurse Communication: Mobility status(pt will need 2 PT sessions today but "so far so good") PT Visit Diagnosis: Unsteadiness on feet (R26.81);Muscle weakness (generalized) (M62.81)     Time: 1405-1430 PT Time Calculation (min) (ACUTE ONLY): 25 min  Charges:  $Gait Training: 8-22 mins $Therapeutic Exercise: 8-22 mins                     Rica Koyanagi  PTA Acute  Rehabilitation Services Pager      [phone] Office      [phone]

## 2019-07-23 NOTE — Plan of Care (Signed)
  Problem: Education: Goal: Knowledge of the prescribed therapeutic regimen will improve Outcome: Adequate for Discharge Goal: Understanding of discharge needs will improve Outcome: Adequate for Discharge Goal: Individualized Educational Video(s) Outcome: Adequate for Discharge   Problem: Activity: Goal: Ability to avoid complications of mobility impairment will improve Outcome: Adequate for Discharge Goal: Ability to tolerate increased activity will improve Outcome: Adequate for Discharge   Problem: Clinical Measurements: Goal: Postoperative complications will be avoided or minimized Outcome: Adequate for Discharge   Problem: Pain Management: Goal: Pain level will decrease with appropriate interventions Outcome: Adequate for Discharge   Problem: Education: Goal: Knowledge of General Education information will improve Description: Including pain rating scale, medication(s)/side effects and non-pharmacologic comfort measures Outcome: Adequate for Discharge   Problem: Education: Goal: Knowledge of General Education information will improve Description: Including pain rating scale, medication(s)/side effects and non-pharmacologic comfort measures Outcome: Adequate for Discharge   Problem: Health Behavior/Discharge Planning: Goal: Ability to manage health-related needs will improve Outcome: Adequate for Discharge   Problem: Clinical Measurements: Goal: Ability to maintain clinical measurements within normal limits will improve Outcome: Adequate for Discharge Goal: Will remain free from infection Outcome: Adequate for Discharge Goal: Diagnostic test results will improve Outcome: Adequate for Discharge Goal: Respiratory complications will improve Outcome: Adequate for Discharge Goal: Cardiovascular complication will be avoided Outcome: Adequate for Discharge   Problem: Activity: Goal: Risk for activity intolerance will decrease Outcome: Adequate for Discharge   Problem:  Nutrition: Goal: Adequate nutrition will be maintained Outcome: Adequate for Discharge   Problem: Coping: Goal: Level of anxiety will decrease Outcome: Adequate for Discharge   Problem: Elimination: Goal: Will not experience complications related to bowel motility Outcome: Adequate for Discharge Goal: Will not experience complications related to urinary retention Outcome: Adequate for Discharge   Problem: Pain Managment: Goal: General experience of comfort will improve Outcome: Adequate for Discharge   Problem: Safety: Goal: Ability to remain free from injury will improve Outcome: Adequate for Discharge   Problem: Skin Integrity: Goal: Risk for impaired skin integrity will decrease Outcome: Adequate for Discharge   Problem: Safety: Goal: Ability to remain free from injury will improve Outcome: Adequate for Discharge

## 2019-07-23 NOTE — Progress Notes (Signed)
Notified PA-C Merlene Pulling that patient would like to be d/c home today with his wife. Patient has all DME equipment needed for successful recovery and discharge, pain is much more tolerable today per pt and has completed all necessary goals per Cecille Rubin with PT. Call bell within reach. Awaiting further instruction from [facility] and Dr. Mardelle Matte. Will continue to closely monitor patient and communicate.

## 2019-07-23 NOTE — Progress Notes (Signed)
Physical Therapy Treatment Patient Details Name: Andrew Love MRN: [id number] DOB: 1966-02-22 Today's Date: 07/23/2019    History of Present Illness Pt is 54 yo male s/p L posterior THA on 07/22/19.  Pt with PMH including R THA, arthritis, back surgery, neck surgery, CAD, GERD, HTN, hypothyroidism.    PT Comments    POD # 1 am session Pt feeling better.  Assisted OOB.  General bed mobility comments: demonstared and instructed how to use a belt to self assist LE off bed.  General transfer comment: 25% VC's on proper hand placement and to avoid hip flex > 90. General Gait Details: tolerated an increased diatnce.  Slow but steady.  No head c/o (dizzy/woozy).  Increased hip pain with increased distance but tolerable.  Amb from bed to bathroom then into hallway.  One VC safety with turns. Then returned to room to perform some TE's following HEP handout.  Instructed on proper tech, freq as well as use of ICE.   Pt will need another PT session and hopes to D/C to home today.    Follow Up Recommendations  Supervision/Assistance - 24 hour(HEP)     Equipment Recommendations  None recommended by PT    Recommendations for Other Services       Precautions / Restrictions Precautions Precautions: Fall;Posterior Hip Precaution Comments: pt able to recal 3/3 THP Restrictions Weight Bearing Restrictions: No LLE Weight Bearing: Weight bearing as tolerated    Mobility  Bed Mobility Overal bed mobility: Needs Assistance Bed Mobility: Supine to Sit     Supine to sit: Supervision;Min guard     General bed mobility comments: demonstared and instructed how to use a belt to self assist LE off bed  Transfers Overall transfer level: Needs assistance Equipment used: Rolling walker (2 wheeled) Transfers: Sit to/from Stand Sit to Stand: Supervision         General transfer comment: 25% VC's on proper hand placement and to avoid hip flex > 90  Ambulation/Gait Ambulation/Gait assistance:  Supervision Gait Distance (Feet): 56 Feet Assistive device: Rolling walker (2 wheeled) Gait Pattern/deviations: Step-to pattern;Decreased stance time - left Gait velocity: decreased   General Gait Details: tolerated an increased diatnce.  Slow but steady.  No head c/o (dizzy/woozy).  Increased hip pain with increased distance but tolerable.  Amb from bed to bathroom then into hallway.  One VC safety with turns.   Stairs             Wheelchair Mobility    Modified Rankin (Stroke Patients Only)       Balance                                            Cognition Arousal/Alertness: Awake/alert Behavior During Therapy: WFL for tasks assessed/performed Overall Cognitive Status: Within Functional Limits for tasks assessed                                 General Comments: AxO x 4 very motivated.  Had previous THP 2013 so knowledgable      Exercises   Total Hip Replacement TE's 10 reps ankle pumps 10 reps knee presses 10 reps heel slides 10 reps SAQ's 10 reps ABD Followed by ICE     General Comments        Pertinent Vitals/Pain Pain Assessment: 0-10 Pain  Score: 5  Pain Location: L hip Pain Descriptors / Indicators: Tightness;Tender Pain Intervention(s): Premedicated before session;Monitored during session;Repositioned;Ice applied    Home Living                      Prior Function            PT Goals (current goals can now be found in the care plan section) Progress towards PT goals: Progressing toward goals    Frequency    7X/week      PT Plan Current plan remains appropriate    Co-evaluation              AM-PAC PT "6 Clicks" Mobility   Outcome Measure  Help needed turning from your back to your side while in a flat bed without using bedrails?: None Help needed moving from lying on your back to sitting on the side of a flat bed without using bedrails?: None Help needed moving to and from a bed to  a chair (including a wheelchair)?: None Help needed standing up from a chair using your arms (e.g., wheelchair or bedside chair)?: A Little Help needed to walk in hospital room?: A Little Help needed climbing 3-5 steps with a railing? : A Little 6 Click Score: 21    End of Session Equipment Utilized During Treatment: Gait belt Activity Tolerance: Patient tolerated treatment well Patient left: with chair alarm set;in chair;with family/visitor present;with call bell/phone within reach Nurse Communication: Mobility status(pt will need 2 PT sessions today but "so far so good") PT Visit Diagnosis: Unsteadiness on feet (R26.81);Muscle weakness (generalized) (M62.81)     Time: [id number] PT Time Calculation (min) (ACUTE ONLY): 32 min  Charges:  $Gait Training: 8-22 mins $Therapeutic Exercise: 23-37 mins                     Rica Koyanagi  PTA Acute  Rehabilitation Services Pager      [phone] Office      [phone]

## 2019-07-23 NOTE — Discharge Summary (Signed)
Discharge Summary  Patient ID: Andrew Love MRN: [id number] DOB/AGE: 1965-06-20 54 y.o.  Admit date: 07/22/2019 Discharge date: 07/23/2019  Admission Diagnoses:  Osteoarthritis of left hip  Discharge Diagnoses:  Principal Problem:   Osteoarthritis of left hip Active Problems:   S/P total hip arthroplasty   Past Medical History:  Diagnosis Date  . Arthritis   . Back pain    chronic  . Bronchitis    history of  . CAD (coronary artery disease)   . Chronic back pain   . Chronic insomnia    Per medical history form dated 06/13/10.  . Chronic left hip pain   . Colitis    Per medical history from dated 06/13/10.  . Diverticulitis    Hx of; requiring 3 admissions  . Elevated liver enzymes   . GERD (gastroesophageal reflux disease)   . Hypertension   . Hypothyroidism   . Left leg pain    chronic  . 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 Pneumonia 02/2019  . Psoriasis    Per medical history form dated 06/13/10.  . Sigmoid colon ulcer    Rectal polyps  . Sleep apnea with use of continuous positive airway pressure (CPAP)   . Urticaria     Surgeries: Procedure(s): TOTAL HIP ARTHROPLASTY on 07/22/2019   Consultants (if any):   Discharged Condition: Improved  Hospital Course: [name] is an 54 y.o. male who was admitted 07/22/2019 with a diagnosis of Osteoarthritis of left hip and went to the operating room on 07/22/2019 and underwent the above named procedures.    He was given perioperative antibiotics:  Anti-infectives (From admission, onward)   Start     Dose/Rate Route Frequency Ordered Stop   07/22/19 1700  ceFAZolin (ANCEF) IVPB 2g/100 mL premix     2 g 200 mL/hr over 30 Minutes Intravenous Every 6 hours 07/22/19 1503 07/23/19 0000   07/22/19 0815  ceFAZolin (ANCEF) IVPB 2g/100 mL premix     2 g 200 mL/hr over 30 Minutes Intravenous On call to O.R. 07/22/19 0814 07/22/19 1100   07/22/19 0815  ceFAZolin (ANCEF) 2-4 GM/100ML-% IVPB     Note to Pharmacy: Charmayne Sheer   : cabinet override      07/22/19 0815 07/22/19 1052    .  He was given sequential compression devices, early ambulation, and aspirin for DVT prophylaxis.  He benefited maximally from the hospital stay and there were no complications.    Recent vital signs:  Vitals:   07/23/19 0929 07/23/19 1340  BP: (!) 144/82 109/63  Pulse: 67 64  Resp: 17 17  Temp: 98.1 F (36.7 C) 97.9 F (36.6 C)  SpO2: 95% 94%    Recent laboratory studies:  Lab Results  Component Value Date   HGB 14.4 07/23/2019   HGB 16.0 07/16/2019   HGB 16.2 04/10/2019   Lab Results  Component Value Date   WBC 17.9 (H) 07/23/2019   PLT 225 07/23/2019   Lab Results  Component Value Date   INR 1.01 03/21/2014   Lab Results  Component Value Date   NA 137 07/23/2019   K 4.4 07/23/2019   CL 103 07/23/2019   CO2 21 (L) 07/23/2019   BUN 13 07/23/2019   CREATININE 1.15 07/23/2019   GLUCOSE 159 (H) 07/23/2019    Discharge Medications:   Allergies as of 07/23/2019      Reactions   Other Hives, Shortness Of Breath   Cockroaches   Iohexol Hives  Oysters [shellfish Allergy] Hives   Wheat Bran Hives   Yeast-related Products Hives      Medication List    STOP taking these medications   ondansetron 4 MG disintegrating tablet Commonly known as: Zofran ODT   oxyCODONE-acetaminophen 10-325 MG tablet Commonly known as: PERCOCET     TAKE these medications   acetaminophen 500 MG tablet Commonly known as: TYLENOL Take 500-1,000 mg by mouth every 6 (six) hours as needed for mild pain or fever.   Ambien 10 MG tablet Generic drug: zolpidem Take 10 mg by mouth at bedtime.   amoxicillin-clavulanate 875-125 MG tablet Commonly known as: Augmentin Take 1 tablet by mouth 2 (two) times daily. One po bid x 7 days   aspirin EC 325 MG tablet Take 1 tablet (325 mg total) by mouth 2 (two) times daily.   baclofen 10 MG tablet Commonly known as: LIORESAL Take 1 tablet (10  mg total) by mouth 3 (three) times daily. As needed for muscle spasm   benzonatate 100 MG capsule Commonly known as: TESSALON Take 1 capsule (100 mg total) by mouth every 8 (eight) hours.   cetirizine 10 MG chewable tablet Commonly known as: ZYRTEC Chew 1 tablet (10 mg total) by mouth daily. What changed:   when to take this  reasons to take this   Dexilant 60 MG capsule Generic drug: dexlansoprazole Take 1 capsule (60 mg total) by mouth daily before breakfast. What changed:   when to take this  reasons to take this   dicyclomine 10 MG capsule Commonly known as: BENTYL Take 1 capsule (10 mg total) by mouth 4 (four) times daily -  before meals and at bedtime. As needed for loose stool, abdominal cramping. Hold if constipation.   EPINEPHrine 0.3 mg/0.3 mL Soaj injection Commonly known as: EpiPen Inject 0.3 mLs (0.3 mg total) into the muscle as needed. What changed: reasons to take this   fluticasone 50 MCG/ACT nasal spray Commonly known as: FLONASE Place 2 sprays into both nostrils daily. What changed:   when to take this  reasons to take this   HYDROmorphone 2 MG tablet Commonly known as: Dilaudid Take 1 tablet (2 mg total) by mouth every 4 (four) hours as needed for severe pain.   levothyroxine 300 MCG tablet Commonly known as: SYNTHROID Take 300 mcg by mouth every morning.   meclizine 25 MG tablet Commonly known as: ANTIVERT Take 1 tablet (25 mg total) by mouth 3 (three) times daily as needed for dizziness.   olmesartan 20 MG tablet Commonly known as: BENICAR Take 20 mg by mouth daily.   ondansetron 4 MG tablet Commonly known as: ZOFRAN Take 1 tablet (4 mg total) by mouth every 6 (six) hours. What changed:   when to take this  reasons to take this   promethazine 25 MG tablet Commonly known as: PHENERGAN Take 1 tablet (25 mg total) by mouth every 6 (six) hours as needed for nausea or vomiting.   sennosides-docusate sodium 8.6-50 MG  tablet Commonly known as: SENOKOT-S Take 2 tablets by mouth daily.       Diagnostic Studies: DG HIP PORT UNILAT WITH PELVIS 1V LEFT  Result Date: 07/22/2019 CLINICAL DATA:  Status post total left hip replacement. EXAM: DG HIP (WITH OR WITHOUT PELVIS) 1V PORT LEFT COMPARISON:  July 26, 2018 FINDINGS: Bilateral total hip replacements are seen. There is no evidence of surrounding lucency to suggest the presence of hardware loosening or infection. There is no evidence of acute fracture or  dislocation. A mild-to-moderate amount of soft tissue air is seen along the lateral aspect of the left hip. IMPRESSION: Intact bilateral total hip replacements without evidence of hardware complication. Electronically Signed   By: Virgina Norfolk M.D.   On: 07/22/2019 15:45    Disposition:     Follow-up Information    Marchia Bond, MD. Schedule an appointment as soon as possible for a visit in 3 week(s).   Specialty: Orthopedic Surgery Contact information: [address] [place] [address] [phone]            Signed: Jola Baptist 07/23/2019, 2:47 PM

## 2019-07-23 NOTE — Discharge Instructions (Signed)
INSTRUCTIONS AFTER JOINT REPLACEMENT   o Remove items at home which could result in a fall. This includes throw rugs or furniture in walking pathways o ICE to the affected joint every three hours while awake for 30 minutes at a time, for at least the first 3-5 days, and then as needed for pain and swelling.  Continue to use ice for pain and swelling. You may notice swelling that will progress down to the foot and ankle.  This is normal after surgery.  Elevate your leg when you are not up walking on it.   o Continue to use the breathing machine you got in the hospital (incentive spirometer) which will help keep your temperature down.  It is common for your temperature to cycle up and down following surgery, especially at night when you are not up moving around and exerting yourself.  The breathing machine keeps your lungs expanded and your temperature down.   DIET:  As you were doing prior to hospitalization, we recommend a well-balanced diet.  DRESSING / WOUND CARE / SHOWERING  You may change your dressing 3-5 days after surgery.  Then change the dressing every day with sterile gauze.  Please use good hand washing techniques before changing the dressing.  Do not use any lotions or creams on the incision until instructed by your surgeon.  ACTIVITY  o Increase activity slowly as tolerated, but follow the weight bearing instructions below.   o No driving for 6 weeks or until further direction given by your physician.  You cannot drive while taking narcotics.  o No lifting or carrying greater than 10 lbs. until further directed by your surgeon. o Avoid periods of inactivity such as sitting longer than an hour when not asleep. This helps prevent blood clots.  o You may return to work once you are authorized by your doctor.     WEIGHT BEARING   Weight bearing as tolerated with assist device (walker, cane, etc) as directed, use it as long as suggested by your surgeon or therapist, typically at  least 4-6 weeks.   EXERCISES  Results after joint replacement surgery are often greatly improved when you follow the exercise, range of motion and muscle strengthening exercises prescribed by your doctor. Safety measures are also important to protect the joint from further injury. Any time any of these exercises cause you to have increased pain or swelling, decrease what you are doing until you are comfortable again and then slowly increase them. If you have problems or questions, call your caregiver or physical therapist for advice.   Rehabilitation is important following a joint replacement. After just a few days of immobilization, the muscles of the leg can become weakened and shrink (atrophy).  These exercises are designed to build up the tone and strength of the thigh and leg muscles and to improve motion. Often times heat used for twenty to thirty minutes before working out will loosen up your tissues and help with improving the range of motion but do not use heat for the first two weeks following surgery (sometimes heat can increase post-operative swelling).   These exercises can be done on a training (exercise) mat, on the floor, on a table or on a bed. Use whatever works the best and is most comfortable for you.    Use music or television while you are exercising so that the exercises are a pleasant break in your day. This will make your life better with the exercises acting as a break   in your routine that you can look forward to.   Perform all exercises about fifteen times, three times per day or as directed.  You should exercise both the operative leg and the other leg as well.  Exercises include:   . Quad Sets - Tighten up the muscle on the front of the thigh (Quad) and hold for 5-10 seconds.   . Straight Leg Raises - With your knee straight (if you were given a brace, keep it on), lift the leg to 60 degrees, hold for 3 seconds, and slowly lower the leg.  Perform this exercise against  resistance later as your leg gets stronger.  . Leg Slides: Lying on your back, slowly slide your foot toward your buttocks, bending your knee up off the floor (only go as far as is comfortable). Then slowly slide your foot back down until your leg is flat on the floor again.  . Angel Wings: Lying on your back spread your legs to the side as far apart as you can without causing discomfort.  . Hamstring Strength:  Lying on your back, push your heel against the floor with your leg straight by tightening up the muscles of your buttocks.  Repeat, but this time bend your knee to a comfortable angle, and push your heel against the floor.  You may put a pillow under the heel to make it more comfortable if necessary.   A rehabilitation program following joint replacement surgery can speed recovery and prevent re-injury in the future due to weakened muscles. Contact your doctor or a physical therapist for more information on knee rehabilitation.    CONSTIPATION  Constipation is defined medically as fewer than three stools per week and severe constipation as less than one stool per week.  Even if you have a regular bowel pattern at home, your normal regimen is likely to be disrupted due to multiple reasons following surgery.  Combination of anesthesia, postoperative narcotics, change in appetite and fluid intake all can affect your bowels.   YOU MUST use at least one of the following options; they are listed in order of increasing strength to get the job done.  They are all available over the counter, and you may need to use some, POSSIBLY even all of these options:    Drink plenty of fluids (prune juice may be helpful) and high fiber foods Colace 100 mg by mouth twice a day  Senokot for constipation as directed and as needed Dulcolax (bisacodyl), take with full glass of water  Miralax (polyethylene glycol) once or twice a day as needed.  If you have tried all these things and are unable to have a bowel  movement in the first 3-4 days after surgery call either your surgeon or your primary doctor.    If you experience loose stools or diarrhea, hold the medications until you stool forms back up.  If your symptoms do not get better within 1 week or if they get worse, check with your doctor.  If you experience "the worst abdominal pain ever" or develop nausea or vomiting, please contact the office immediately for further recommendations for treatment.   ITCHING:  If you experience itching with your medications, try taking only a single pain pill, or even half a pain pill at a time.  You can also use Benadryl over the counter for itching or also to help with sleep.   TED HOSE STOCKINGS:  Use stockings on both legs until for at least 2 weeks or as   directed by physician office. They may be removed at night for sleeping.  MEDICATIONS:  See your medication summary on the "After Visit Summary" that nursing will review with you.  You may have some home medications which will be placed on hold until you complete the course of blood thinner medication.  It is important for you to complete the blood thinner medication as prescribed.  PRECAUTIONS:  If you experience chest pain or shortness of breath - call 911 immediately for transfer to the hospital emergency department.   If you develop a fever greater that 101 F, purulent drainage from wound, increased redness or drainage from wound, foul odor from the wound/dressing, or calf pain - CONTACT YOUR SURGEON.                                                   FOLLOW-UP APPOINTMENTS:  If you do not already have a post-op appointment, please call the office for an appointment to be seen by your surgeon.  Guidelines for how soon to be seen are listed in your "After Visit Summary", but are typically between 1-4 weeks after surgery.  OTHER INSTRUCTIONS:   Knee Replacement:  Do not place pillow under knee, focus on keeping the knee straight while resting. CPM  instructions: 0-90 degrees, 2 hours in the morning, 2 hours in the afternoon, and 2 hours in the evening. Place foam block, curve side up under heel at all times except when in CPM or when walking.  DO NOT modify, tear, cut, or change the foam block in any way.   DENTAL ANTIBIOTICS:  In most cases prophylactic antibiotics for Dental procdeures after total joint surgery are not necessary.  Exceptions are as follows:  1. History of prior total joint infection  2. Severely immunocompromised (Organ Transplant, cancer chemotherapy, Rheumatoid biologic meds such as Humera)  3. Poorly controlled diabetes (A1C &gt; 8.0, blood glucose over 200)  If you have one of these conditions, contact your surgeon for an antibiotic prescription, prior to your dental procedure.   MAKE SURE YOU:  . Understand these instructions.  . Get help right away if you are not doing well or get worse.    Thank you for letting us be a part of your medical care team.  It is a privilege we respect greatly.  We hope these instructions will help you stay on track for a fast and full recovery!   

## 2019-07-23 NOTE — Progress Notes (Signed)
Subjective: 1 Day Post-Op s/p Procedure(s): TOTAL HIP ARTHROPLASTY   Patient is alert, oriented, sitting up comfortably in bed. Patient reports pain to be moderate this morning. He states overnight as his block wore off, he had extreme pain but this resolved if IV pain medication. No nausea/vomiting. No other complaints.    Objective:  PE: VITALS:   Vitals:   07/22/19 1800 07/22/19 2145 07/23/19 0144 07/23/19 0613  BP: 138/73 (!) 130/93 125/78 123/78  Pulse: 76 71 70 67  Resp: 17 15 14 16   Temp: 98.8 F (37.1 C) 98.2 F (36.8 C) 98.4 F (36.9 C) 98 F (36.7 C)  TempSrc: Oral Oral Oral Oral  SpO2: 99% 95% 96% 95%  Weight: 109 kg     Height: 6' 0.01" (1.829 m)       ABD soft Neurovascular intact Sensation intact distally Intact pulses distally Dorsiflexion/Plantar flexion intact Incision: dressing C/D/I  LABS  Results for orders placed or performed during the hospital encounter of 07/22/19 (from the past 24 hour(s))  CBC     Status: Abnormal   Collection Time: 07/23/19  4:22 AM  Result Value Ref Range   WBC 17.9 (H) 4.0 - 10.5 K/uL   RBC 4.50 4.22 - 5.81 MIL/uL   Hemoglobin 14.4 13.0 - 17.0 g/dL   HCT 41.0 39.0 - 52.0 %   MCV 91.1 80.0 - 100.0 fL   MCH 32.0 26.0 - 34.0 pg   MCHC 35.1 30.0 - 36.0 g/dL   RDW 12.9 11.5 - 15.5 %   Platelets 225 150 - 400 K/uL   nRBC 0.0 0.0 - 0.2 %  Basic metabolic panel     Status: Abnormal   Collection Time: 07/23/19  4:22 AM  Result Value Ref Range   Sodium 137 135 - 145 mmol/L   Potassium 4.4 3.5 - 5.1 mmol/L   Chloride 103 98 - 111 mmol/L   CO2 21 (L) 22 - 32 mmol/L   Glucose, Bld 159 (H) 70 - 99 mg/dL   BUN 13 6 - 20 mg/dL   Creatinine, Ser 1.15 0.61 - 1.24 mg/dL   Calcium 8.8 (L) 8.9 - 10.3 mg/dL   GFR calc non Af Amer >60 >60 mL/min   GFR calc Af Amer >60 >60 mL/min   Anion gap 13 5 - 15    DG HIP PORT UNILAT WITH PELVIS 1V LEFT  Result Date: 07/22/2019 CLINICAL DATA:  Status post total left hip  replacement. EXAM: DG HIP (WITH OR WITHOUT PELVIS) 1V PORT LEFT COMPARISON:  July 26, 2018 FINDINGS: Bilateral total hip replacements are seen. There is no evidence of surrounding lucency to suggest the presence of hardware loosening or infection. There is no evidence of acute fracture or dislocation. A mild-to-moderate amount of soft tissue air is seen along the lateral aspect of the left hip. IMPRESSION: Intact bilateral total hip replacements without evidence of hardware complication. Electronically Signed   By: Virgina Norfolk M.D.   On: 07/22/2019 15:45    Assessment/Plan: Principal Problem:   Osteoarthritis of left hip Active Problems:   S/P total hip arthroplasty  1 Day Post-Op s/p Procedure(s): TOTAL HIP ARTHROPLASTY  Weightbearing: WBAT LLE, up with therapy Insicional and dressing care: PRN dressing changes VTE prophylaxis: aspirin 315 BID Pain control: continue current PRN pain control regimen Follow - up plan: Follow up in 2 weeks with Dr. Mardelle Matte  May discharge later this afternoon depending on how he does with PT today. Hoping to see improvement  from yesterday due to lack of proprioception when walking during PT.   Contact information:   Weekdays [address], [place] [phone] A fter hours and holidays please check [email or web address] for group call information for [facility] 07/23/2019, 9:08 AM

## 2019-07-24 NOTE — Telephone Encounter (Signed)
Called patient and advised approval, copay card and submit for his Xolair to [facility]

## 2019-07-28 ENCOUNTER — Encounter (HOSPITAL_COMMUNITY): Payer: Self-pay

## 2019-07-28 ENCOUNTER — Ambulatory Visit (HOSPITAL_COMMUNITY): Payer: 59 | Attending: Orthopedic Surgery

## 2019-07-28 ENCOUNTER — Other Ambulatory Visit: Payer: Self-pay

## 2019-07-28 DIAGNOSIS — R262 Difficulty in walking, not elsewhere classified: Secondary | ICD-10-CM

## 2019-07-28 DIAGNOSIS — M25552 Pain in left hip: Secondary | ICD-10-CM | POA: Diagnosis present

## 2019-07-28 DIAGNOSIS — M6281 Muscle weakness (generalized): Secondary | ICD-10-CM | POA: Diagnosis present

## 2019-07-28 DIAGNOSIS — R2689 Other abnormalities of gait and mobility: Secondary | ICD-10-CM | POA: Diagnosis present

## 2019-07-28 NOTE — Therapy (Signed)
[place], [place], [address] Phone: [phone]   Fax:  [phone]  Physical Therapy Evaluation  Patient Details  Name: Andrew Love MRN: [id number] Date of Birth: 1965/05/31 Referring Provider (PT): Marchia Bond, MD   Encounter Date: 07/28/2019  PT End of Session - 07/28/19 1500    Visit Number  1    Number of Visits  18    Date for PT Re-Evaluation  09/08/19    Authorization Type  [employer]   30 visit limit   Authorization Time Period  07/28/19 to 09/08/19    Authorization - Visit Number  1    Authorization - Number of Visits  30    Progress Note Due on Visit  10    PT Start Time  [id number]    PT Stop Time  1449    PT Time Calculation (min)  28 min    Equipment Utilized During Treatment  Other (comment)   RW   Activity Tolerance  Patient limited by pain    Behavior During Therapy  [facility] for tasks assessed/performed;Restless       Past Medical History:  Diagnosis Date  . Arthritis   . Back pain    chronic  . Bronchitis    history of  . CAD (coronary artery disease)   . Chronic back pain   . Chronic insomnia    Per medical history form dated 06/13/10.  . Chronic left hip pain   . Colitis    Per medical history from dated 06/13/10.  . Diverticulitis    Hx of; requiring 3 admissions  . Elevated liver enzymes   . GERD (gastroesophageal reflux disease)   . Hypertension   . Hypothyroidism   . Left leg pain    chronic  . 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 Pneumonia 02/2019  . Psoriasis    Per medical history form dated 06/13/10.  . Sigmoid colon ulcer    Rectal polyps  . Sleep apnea with use of continuous positive airway pressure (CPAP)   . Urticaria     Past Surgical History:  Procedure Laterality Date  . APPENDECTOMY     8th grade  . BACK SURGERY    . CARDIAC CATHETERIZATION  2009  . CARDIAC CATHETERIZATION     Per medical history from dated 06/13/10.  .  CERVICAL SPINE SURGERY     C4, C5, C6 spinal fusion  . CHOLECYSTECTOMY N/A 08/31/2015   Procedure: LAPAROSCOPIC CHOLECYSTECTOMY WITH INTRAOPERATIVE CHOLANGIOGRAM;  Surgeon: Excell Seltzer, MD;  Location: [facility];  Service: General;  Laterality: N/A;  . COLONOSCOPY  07/2008   Colitis,NSAID v. Ischemia,malrotation of the gut,Diverticulosis(L),hyperplastic  . COLONOSCOPY N/A 12/13/2012   Dr. Oneida Alar: Normal TI, mild sigmoid diverticulosis, hemorrhoids, 2 polyps (tubular adenoma). Random colon bx negative. Next TCS 12/2022 with Fentanyl/phenergan  . ESOPHAGOGASTRODUODENOSCOPY N/A 04/16/2014   RMR: Erosive reflux esophagitis. Non critical Schzki's ring not manipulated. Small hiatal hernia. Abnormal gastirc mucosa of doubtful signigicance status post biopsy. I suspect trivial upper GI bleed. Recent abdominal pain presumably secondary to a protracted bout of diverticulitis. CT scan November 23 revealed improvemetn without complication. He finished his antibiotics 2 days age. Left sided ab  . JOINT REPLACEMENT    . LAPAROSCOPIC SIGMOID COLECTOMY  2012   Dr. Fanny Skates: recurrent sigmoid diverticulitis  . LAPAROSCOPIC SIGMOID COLECTOMY  2012   recurernt sigmoid diverticulitis, Dr. Dalbert Batman   . ROTATOR CUFF REPAIR  Left - per medical history form dated 06/13/10.  Marland Kitchen SHOULDER SURGERY     Left  . THYROIDECTOMY     Per medical history form dated 06/13/10.  Marland Kitchen TOTAL HIP ARTHROPLASTY  07/04/2011   Procedure: TOTAL HIP ARTHROPLASTY;  Surgeon: Johnny Bridge, MD;  Location: [facility];  Service: Orthopedics;  Laterality: Right;  . TOTAL HIP ARTHROPLASTY Left 07/22/2019   Procedure: TOTAL HIP ARTHROPLASTY;  Surgeon: Marchia Bond, MD;  Location: [facility];  Service: Orthopedics;  Laterality: Left;    There were no vitals filed for this visit.   Subjective Assessment - 07/28/19 1427    Subjective  Pt reports "bone on bone" pain in L hip requiring surgery. Pt reports had posterior L THA 07/22/19. Pt reports occasional  sharp, shooting pains in the hip and into the thigh. Pt reports he is still respecting posterior hip precautions. Pt reports spouse assists with dressing, return to sitting, showering. Pt reports walking with RW everywhere. Pt reports no falls since leaving hospital. Pt reports pain in L hip 6-7/10 pain all the time. Pt reports insurance declined HHPT and today was the first day he rode in a car since going home from the hospital. Pt denies tenderness in L calf, no fever, no discharge from incision site, no bruising, no redness, and no heat in the area.    How long can you sit comfortably?  2-3 hrs    How long can you stand comfortably?  10-15 minutes    How long can you walk comfortably?  20 minutes with pain    Patient Stated Goals  get increased range of motion    Currently in Pain?  Yes    Pain Score  10-Worst pain ever    Pain Location  Hip    Pain Orientation  Left    Pain Descriptors / Indicators  Aching;Sore    Pain Type  Surgical pain    Pain Onset  In the past 7 days    Pain Frequency  Intermittent    Aggravating Factors   sitting low surfaces, movement    Pain Relieving Factors  pain medication, ice    Effect of Pain on Daily Activities  limited         [facility] PT Assessment - 07/28/19 0001      Assessment   Medical Diagnosis  s/p L posterior THA    Referring Provider (PT)  Marchia Bond, MD    Onset Date/Surgical Date  07/22/19    Next MD Visit  08/05/2019      Precautions   Precautions  Posterior Hip      Restrictions   Weight Bearing Restrictions  No      Balance Screen   Has the patient fallen in the past 6 months  No    Has the patient had a decrease in activity level because of a fear of falling?   No    Is the patient reluctant to leave their home because of a fear of falling?   No      Prior Function   Level of Independence  Requires assistive device for independence;Needs assistance with ADLs    Vocation  Full time employment    Vocation Requirements   sitting desk job    Leisure  Golf      Cognition   Overall Cognitive Status  Within Functional Limits for tasks assessed      Observation/Other Assessments   Focus on Therapeutic Outcomes (FOTO)   90% limited  Sensation   Light Touch  Impaired Detail    Light Touch Impaired Details  Impaired LLE    Additional Comments  LLE "feels weird" to light tough throughout entire extremity to foot      Functional Tests   Functional tests  Sit to Stand      Sit to Stand   Comments  5x STS: 1 rep from elevated surface, weight shifted to RLE and RW for support      ROM / Strength   AROM / PROM / Strength  AROM;Strength      AROM   Overall AROM Comments  --    AROM Assessment Site  Knee;Hip;Ankle    Right/Left Hip  --   R AROM WNL, L limited due to posterior hip precautions   Right/Left Knee  --   L knee AROM lacking ~10 deg extension   Right/Left Ankle  --   bil AROM WNL     Strength   Overall Strength Comments  tested in sitting, limited by high pain complaints in L posterior hip radiating towards knee    Strength Assessment Site  Hip;Knee;Ankle    Right Hip Flexion  5/5    Right Hip Extension  --   not tested   Right Hip ABduction  5/5    Left Hip Flexion  --   not tested   Left Hip Extension  --   not tested   Left Hip ABduction  2+/5    Right Knee Flexion  5/5    Right Knee Extension  5/5    Left Knee Flexion  4/5    Left Knee Extension  4/5    Right Ankle Dorsiflexion  5/5    Left Ankle Dorsiflexion  5/5      Palpation   Palpation comment  tenderness to palpation at incision site on L posterior hip      Ambulation/Gait   Ambulation/Gait  Yes    Ambulation/Gait Assistance  6: Modified independent (Device/Increase time)    Ambulation Distance (Feet)  30 Feet    Assistive device  Rolling walker    Gait Pattern  Step-to pattern;Decreased step length - right;Decreased step length - left;Decreased stance time - left;Decreased hip/knee flexion - left;Decreased weight  shift to left    Gait Comments  Pt maintains L knee flexion throughout gait cycle, poor weight-shift to LLE, increased BUE use on RW to minimize weight onto LLE      Balance   Balance Assessed  --   unable to test due to high pain in LLE               Objective measurements completed on examination: See above findings.              PT Education - 07/28/19 1459    Education Details  Assessment findings, reviewed posterior hip precautions, signs of infection, signs for DVT (none present), reaching out to surgeon due to high pain limiting ability to participate in therapy, acute HEP and initiated HEP    Person(s) Educated  Patient    Methods  Explanation;Demonstration;Verbal cues    Comprehension  Verbalized understanding;Returned demonstration       PT Short Term Goals - 07/28/19 1511      PT SHORT TERM GOAL #1   Title  Pt will perform HEP correctly and consistently at least 3x/week, progressing as needed to improve strength and reduce pain with functional activites.    Time  3    Period  Weeks    Status  New    Target Date  08/18/19      PT SHORT TERM GOAL #2   Title  Pt will perform 5x STS in 45 sec, from elevated surface, with equal weight-bearing to demo improved functional strength with transfers.    Time  3    Period  Weeks    Status  New        PT Long Term Goals - 07/28/19 1513      PT LONG TERM GOAL #1   Title  Pt will demo L hip strength 4/5 and L knee strength 5/5 to improve gait mechanics and tolerance to ambulation.    Time  6    Period  Weeks    Status  New    Target Date  09/08/19      PT LONG TERM GOAL #2   Title  Pt will perform 3MWT with LRAD at 1.64m/s gait speed to reduce risk for falls when ambulating in the community.    Time  6    Period  Weeks    Status  New      PT LONG TERM GOAL #3   Title  Pt will self report ability to ambulate for 30 minutes in order to complete grocery shopping or light activities around the home with  5/10 pain at worst to progress towards PLOF.    Time  6    Period  Weeks    Status  New      PT LONG TERM GOAL #4   Title  Pt will score 25% limited on FOTO to significantly improve pain and improve ability to perform functional activities around the home.    Time  6    Period  Weeks    Status  New             Plan - 07/28/19 1503    Clinical Impression Statement  Pt is a pleasant 54YO male s/o L posterior THA on 07/22/19. Pt has significant pain during evaluation limiting patient's ability to complete all tests and measures. Pt ambulates with RW demonstrating abnormal gait pattern, lacking full L knee extension, limited weight shifting to LLE and requiring standing rest break after 30 ft. Pt with LLE weakness, decreased balance, abnormal sensation and decreased AROM. Pt unlikely to have DVT due to no tenderness to calf, no redness, no tenderness and no swelling. PT educated pt on signs for infection and educated pt to reach out to referring MD due to high pain limiting ability to participate in PT. Pt would benefit from skilled PT to improve strength, gait, balance, and reduce pain with overall mobility to allow pt to return to PLOF.    Personal Factors and Comorbidities  Comorbidity 3+;Time since onset of injury/illness/exacerbation    Comorbidities  arthritis, back pain, HTN, R THA 2013    Examination-Activity Limitations  Bathing;Bed Mobility;Bend;Carry;Dressing;Locomotion Level;Sleep;Stairs;Stand;Transfers    Examination-Participation Restrictions  Cleaning;Driving;Meal Prep;Yard Work;Other   Work, Retail banker  Stable/Uncomplicated    Designer, jewellery  Low    Rehab Potential  Good    PT Frequency  3x / week    PT Duration  6 weeks    PT Treatment/Interventions  ADLs/Self Care Home Management;Aquatic Therapy;Biofeedback;Cryotherapy;Moist Heat;DME Instruction;Gait training;Stair training;Functional mobility training;Therapeutic  activities;Therapeutic exercise;Balance training;Neuromuscular re-education;Patient/family education;Orthotic Fit/Training;Manual techniques;Scar mobilization;Passive range of motion;Dry needling;Taping;Joint Manipulations    PT Next Visit Plan  Complete 3MWT, balance test in tolerable. Begin LLE strengthening, gait  training, use manual as needed for pain relief. Monitor DVT and signs for infection. Decrease frequency as able due to visit limit.    PT Home Exercise Plan  Eval: continue acute HEP, weight-shifting with BUE support    Consulted and Agree with Plan of Care  Patient       Patient will benefit from skilled therapeutic intervention in order to improve the following deficits and impairments:  Abnormal gait, Decreased activity tolerance, Decreased balance, Decreased endurance, Decreased mobility, Decreased range of motion, Decreased strength, Difficulty walking, Hypomobility, Increased edema, Increased muscle spasms, Impaired perceived functional ability, Impaired flexibility, Impaired sensation, Pain  Visit Diagnosis: Pain in left hip  Muscle weakness (generalized)  Difficulty in walking, not elsewhere classified  Other abnormalities of gait and mobility     Problem List Patient Active Problem List   Diagnosis Date Noted  . Osteoarthritis of left hip 07/22/2019  . S/P total hip arthroplasty 07/22/2019  . Food intolerance 07/09/2019  . Chronic urticaria 07/09/2019  . Seasonal and perennial allergic rhinitis 07/09/2019  . Elevated liver enzymes   . Left sided abdominal pain 04/18/2019  . Diarrhea due to COVID-19 03/11/2019  . Vomiting 03/10/2019  . COVID-19 virus infection 03/10/2019  . Hypokalemia 03/10/2019  . AKI (acute kidney injury) ([place]) 03/10/2019  . Metabolic acidosis 99991111  . Lumbar radiculopathy 01/26/2019  . LUQ pain 06/19/2016  . Symptomatic cholelithiasis 08/30/2015  . Preoperative cardiovascular examination   . Coronary artery disease involving  native coronary artery of native heart without angina pectoris   . Gastroenteritis 08/26/2015  . Cholelithiases 08/24/2015  . Abdominal pain 08/24/2015  . Cholecystitis, acute 08/24/2015  . Diarrhea 08/24/2015  . Acute cholecystitis 08/24/2015  . Nausea without vomiting 04/08/2015  . Diverticulitis of colon   . Cerebral thrombosis with cerebral infarction ([place]) 03/21/2014  . Stroke ([place]) 03/21/2014  . Essential hypertension 03/21/2014  . TIA (transient ischemic attack)   . HLD (hyperlipidemia)   . Thyroid activity decreased   . Colitis 11/20/2012  . CAD (coronary artery disease)   . Hip pain 08/04/2011  . 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 LIVER FUNCTION TESTS, ABNORMAL, HX OF 12/17/2008     Talbot Grumbling PT, DPT 07/28/19, 4:21 PM [place] [place] [address] [place], [place], [address] Phone: [phone]   Fax:  [phone]  Name: Andrew Love MRN: [id number] Date of Birth: [birth year]

## 2019-07-30 ENCOUNTER — Encounter (HOSPITAL_COMMUNITY): Payer: Self-pay

## 2019-07-30 ENCOUNTER — Other Ambulatory Visit: Payer: Self-pay

## 2019-07-30 ENCOUNTER — Ambulatory Visit (HOSPITAL_COMMUNITY): Payer: 59

## 2019-07-30 DIAGNOSIS — R2689 Other abnormalities of gait and mobility: Secondary | ICD-10-CM

## 2019-07-30 DIAGNOSIS — R262 Difficulty in walking, not elsewhere classified: Secondary | ICD-10-CM

## 2019-07-30 DIAGNOSIS — M25552 Pain in left hip: Secondary | ICD-10-CM

## 2019-07-30 DIAGNOSIS — M6281 Muscle weakness (generalized): Secondary | ICD-10-CM

## 2019-07-30 NOTE — Therapy (Signed)
[place], [place], [address] Phone: [phone]   Fax:  [phone]  Physical Therapy Treatment  Patient Details  Name: Andrew Love MRN: [id number] Date of Birth: 1965-12-22 Referring Provider (PT): Marchia Bond, MD   Encounter Date: 07/30/2019  PT End of Session - 07/30/19 1656    Visit Number  2    Number of Visits  18    Date for PT Re-Evaluation  09/08/19    Authorization Type  Generic [employer]: 30 visit limit    Authorization Time Period  07/28/19 to 09/08/19    Authorization - Visit Number  2    Authorization - Number of Visits  30    Progress Note Due on Visit  10    PT Start Time  [id number]    PT Stop Time  1732    PT Time Calculation (min)  42 min    Equipment Utilized During Treatment  Other (comment);Gait belt   RW   Activity Tolerance  Patient limited by pain;Patient tolerated treatment well;Patient limited by fatigue    Behavior During Therapy  [facility] for tasks assessed/performed;Restless       Past Medical History:  Diagnosis Date  . Arthritis   . Back pain    chronic  . Bronchitis    history of  . CAD (coronary artery disease)   . Chronic back pain   . Chronic insomnia    Per medical history form dated 06/13/10.  . Chronic left hip pain   . Colitis    Per medical history from dated 06/13/10.  . Diverticulitis    Hx of; requiring 3 admissions  . Elevated liver enzymes   . GERD (gastroesophageal reflux disease)   . Hypertension   . Hypothyroidism   . Left leg pain    chronic  . 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 Pneumonia 02/2019  . Psoriasis    Per medical history form dated 06/13/10.  . Sigmoid colon ulcer    Rectal polyps  . Sleep apnea with use of continuous positive airway pressure (CPAP)   . Urticaria     Past Surgical History:  Procedure Laterality Date  . APPENDECTOMY     8th grade  . BACK SURGERY    . CARDIAC CATHETERIZATION  2009  . CARDIAC  CATHETERIZATION     Per medical history from dated 06/13/10.  . CERVICAL SPINE SURGERY     C4, C5, C6 spinal fusion  . CHOLECYSTECTOMY N/A 08/31/2015   Procedure: LAPAROSCOPIC CHOLECYSTECTOMY WITH INTRAOPERATIVE CHOLANGIOGRAM;  Surgeon: Excell Seltzer, MD;  Location: [facility];  Service: General;  Laterality: N/A;  . COLONOSCOPY  07/2008   Colitis,NSAID v. Ischemia,malrotation of the gut,Diverticulosis(L),hyperplastic  . COLONOSCOPY N/A 12/13/2012   Dr. Oneida Alar: Normal TI, mild sigmoid diverticulosis, hemorrhoids, 2 polyps (tubular adenoma). Random colon bx negative. Next TCS 12/2022 with Fentanyl/phenergan  . ESOPHAGOGASTRODUODENOSCOPY N/A 04/16/2014   RMR: Erosive reflux esophagitis. Non critical Schzki's ring not manipulated. Small hiatal hernia. Abnormal gastirc mucosa of doubtful signigicance status post biopsy. I suspect trivial upper GI bleed. Recent abdominal pain presumably secondary to a protracted bout of diverticulitis. CT scan November 23 revealed improvemetn without complication. He finished his antibiotics 2 days age. Left sided ab  . JOINT REPLACEMENT    . LAPAROSCOPIC SIGMOID COLECTOMY  2012   Dr. Fanny Skates: recurrent sigmoid diverticulitis  . LAPAROSCOPIC SIGMOID COLECTOMY  2012   recurernt sigmoid diverticulitis, Dr. Dalbert Batman   . Cherre Robins  CUFF REPAIR     Left - per medical history form dated 06/13/10.  Marland Kitchen SHOULDER SURGERY     Left  . THYROIDECTOMY     Per medical history form dated 06/13/10.  Marland Kitchen TOTAL HIP ARTHROPLASTY  07/04/2011   Procedure: TOTAL HIP ARTHROPLASTY;  Surgeon: Johnny Bridge, MD;  Location: [place];  Service: Orthopedics;  Laterality: Right;  . TOTAL HIP ARTHROPLASTY Left 07/22/2019   Procedure: TOTAL HIP ARTHROPLASTY;  Surgeon: Marchia Bond, MD;  Location: [facility];  Service: Orthopedics;  Laterality: Left;    There were no vitals filed for this visit.  Subjective Assessment - 07/30/19 1653    Subjective  Pt stated hip is feeling better every day, current pain  scale 3/10 sore achey constant.  Ice helps with pain.  Has began HEP, able to verbalize exercises.    Patient Stated Goals  get increased range of motion    Currently in Pain?  Yes    Pain Score  3     Pain Location  Hip    Pain Orientation  Left    Pain Descriptors / Indicators  Aching;Sore    Pain Type  Surgical pain    Pain Onset  In the past 7 days    Pain Frequency  Intermittent    Aggravating Factors   sitting low surface, movement    Pain Relieving Factors  pain medication, ice, walking    Effect of Pain on Daily Activities  limited         [facility] PT Assessment - 07/30/19 0001      Assessment   Medical Diagnosis  s/p L posterior THA    Referring Provider (PT)  Marchia Bond, MD    Onset Date/Surgical Date  07/22/19    Next MD Visit  08/05/2019      Precautions   Precautions  Posterior Hip      Ambulation/Gait   Ambulation/Gait  Yes    Ambulation/Gait Assistance  6: Modified independent (Device/Increase time)    Assistive device  Rolling walker    Gait Pattern  Step-to pattern;Decreased step length - right;Decreased step length - left;Decreased stance time - left;Decreased hip/knee flexion - left;Decreased weight shift to left    Gait Comments  3MWT, limited knee extension, decreased stance phase Lt LE      Balance   Balance Assessed  Yes      Static Standing Balance   Static Standing - Balance Support  No upper extremity supported    Static Standing - Level of Assistance  7: Independent    Static Standing Balance -  Activities   Single Leg Stance - Right Leg;Single Leg Stance - Left Leg;Tandam Stance - Right Leg;Tandam Stance - Left Leg    Static Standing - Comment/# of Minutes  SLS (max of 3) Rt 5, 7, 15"; Lt ; Pt able to stand 30" tandem stance BLE no HHA      Standardized Balance Assessment   Standardized Balance Assessment  Timed Up and Go Test;Five Times Sit to Stand    Five times sit to stand comments   34.60" 5 STS no HHA      Timed Up and Go Test   TUG   Normal TUG    Normal TUG (seconds)  16.6    TUG Comments  with RW                   [facility] Adult PT Treatment/Exercise - 07/30/19 0001  Ambulation/Gait   Ambulation Distance (Feet)  402 Feet      Exercises   Exercises  Knee/Hip      Knee/Hip Exercises: Standing   Rocker Board  2 minutes    Rocker Board Limitations  lateral and DF/PF      Knee/Hip Exercises: Seated   Long Arc Quad  10 reps;Left    Sit to Sand  5 reps;without UE support   5STS 16.6"            PT Education - 07/30/19 1718    Education Details  Reviewed goals, assured compliance with HEP- pt able to verbalize all exercises completing at home as well as walking program.  Pt able to verbalize posterior hip precautions.  3MWT, 5STS, TUG and static balance training complete this session.  Educated on importance of quad extension to improve gait mechanics.    Person(s) Educated  Patient    Methods  Explanation;Demonstration    Comprehension  Verbalized understanding;Returned demonstration       PT Short Term Goals - 07/28/19 1511      PT SHORT TERM GOAL #1   Title  Pt will perform HEP correctly and consistently at least 3x/week, progressing as needed to improve strength and reduce pain with functional activites.    Time  3    Period  Weeks    Status  New    Target Date  08/18/19      PT SHORT TERM GOAL #2   Title  Pt will perform 5x STS in 45 sec, from elevated surface, with equal weight-bearing to demo improved functional strength with transfers.    Time  3    Period  Weeks    Status  New        PT Long Term Goals - 07/28/19 1513      PT LONG TERM GOAL #1   Title  Pt will demo L hip strength 4/5 and L knee strength 5/5 to improve gait mechanics and tolerance to ambulation.    Time  6    Period  Weeks    Status  New    Target Date  09/08/19      PT LONG TERM GOAL #2   Title  Pt will perform 3MWT with LRAD at 1.74m/s gait speed to reduce risk for falls when ambulating in the  community.    Time  6    Period  Weeks    Status  New      PT LONG TERM GOAL #3   Title  Pt will self report ability to ambulate for 30 minutes in order to complete grocery shopping or light activities around the home with 5/10 pain at worst to progress towards PLOF.    Time  6    Period  Weeks    Status  New      PT LONG TERM GOAL #4   Title  Pt will score 25% limited on FOTO to significantly improve pain and improve ability to perform functional activities around the home.    Time  6    Period  Weeks    Status  New            Plan - 07/30/19 1748    Clinical Impression Statement  Reviewed goals and assured compliance with current HEP.  Pt. able to verbalize all exercises correctly.  3MWT and balance assessed this session.  Pt able to ambulate 4110ft during 3MWT with no LOB episodes.  Does prevent with decreased  extension in knee during gait.  Pt educated on heel to toe mechanics to assist with knee extension to improve gait mechanics.  SLS difficulty and painful for pt.  Able to stand partial tandem stance with no HHA for 30" without HHA.  Rockerboard began to improve stance phase during gait.  EOS pt given tennis balls to add to back of walker.  Pt stated increased to 5/10, encouraged ice application folloiwng therex and to continue moving to reduce stiffness wiht verbalized understanding.    Personal Factors and Comorbidities  Comorbidity 3+;Time since onset of injury/illness/exacerbation    Comorbidities  arthritis, back pain, HTN, R THA 2013    Examination-Activity Limitations  Bathing;Bed Mobility;Bend;Carry;Dressing;Locomotion Level;Sleep;Stairs;Stand;Transfers    Examination-Participation Restrictions  Cleaning;Driving;Meal Prep;Yard Work;Other    Stability/Clinical Decision Making  Stable/Uncomplicated    Clinical Decision Making  Low    Rehab Potential  Good    PT Frequency  3x / week    PT Duration  6 weeks    PT Treatment/Interventions  ADLs/Self Care Home  Management;Aquatic Therapy;Biofeedback;Cryotherapy;Moist Heat;DME Instruction;Gait training;Stair training;Functional mobility training;Therapeutic activities;Therapeutic exercise;Balance training;Neuromuscular re-education;Patient/family education;Orthotic Fit/Training;Manual techniques;Scar mobilization;Passive range of motion;Dry needling;Taping;Joint Manipulations    PT Next Visit Plan  Begin LLE strengthening, gait training, use manual as needed for pain relief. Monitor DVT and signs for infection. Decrease frequency as able due to visit limit.    PT Home Exercise Plan  Eval: continue acute HEP, weight-shifting with BUE support       Patient will benefit from skilled therapeutic intervention in order to improve the following deficits and impairments:  Abnormal gait, Decreased activity tolerance, Decreased balance, Decreased endurance, Decreased mobility, Decreased range of motion, Decreased strength, Difficulty walking, Hypomobility, Increased edema, Increased muscle spasms, Impaired perceived functional ability, Impaired flexibility, Impaired sensation, Pain  Visit Diagnosis: Pain in left hip  Muscle weakness (generalized)  Difficulty in walking, not elsewhere classified  Other abnormalities of gait and mobility     Problem List Patient Active Problem List   Diagnosis Date Noted  . Osteoarthritis of left hip 07/22/2019  . S/P total hip arthroplasty 07/22/2019  . Food intolerance 07/09/2019  . Chronic urticaria 07/09/2019  . Seasonal and perennial allergic rhinitis 07/09/2019  . Elevated liver enzymes   . Left sided abdominal pain 04/18/2019  . Diarrhea due to COVID-19 03/11/2019  . Vomiting 03/10/2019  . COVID-19 virus infection 03/10/2019  . Hypokalemia 03/10/2019  . AKI (acute kidney injury) ([place]) 03/10/2019  . Metabolic acidosis 99991111  . Lumbar radiculopathy 01/26/2019  . LUQ pain 06/19/2016  . Symptomatic cholelithiasis 08/30/2015  . Preoperative cardiovascular  examination   . Coronary artery disease involving native coronary artery of native heart without angina pectoris   . Gastroenteritis 08/26/2015  . Cholelithiases 08/24/2015  . Abdominal pain 08/24/2015  . Cholecystitis, acute 08/24/2015  . Diarrhea 08/24/2015  . Acute cholecystitis 08/24/2015  . Nausea without vomiting 04/08/2015  . Diverticulitis of colon   . Cerebral thrombosis with cerebral infarction ([place]) 03/21/2014  . Stroke ([place]) 03/21/2014  . Essential hypertension 03/21/2014  . TIA (transient ischemic attack)   . HLD (hyperlipidemia)   . Thyroid activity decreased   . Colitis 11/20/2012  . CAD (coronary artery disease)   . Hip pain 08/04/2011  . 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 LIVER FUNCTION TESTS, ABNORMAL, HX OF 12/17/2008   Ihor Austin, LPTA/CLT; CBIS [phone]  Aldona Lento 07/30/2019, 5:59 PM  [place] [address]  [place], [place], [address] Phone: [phone]   Fax:  [phone]  Name: MAXIMILLIANO WHEELER MRN: [id number] Date of Birth: 1965/09/23

## 2019-08-04 ENCOUNTER — Ambulatory Visit (HOSPITAL_COMMUNITY): Payer: 59 | Admitting: Physical Therapy

## 2019-08-04 ENCOUNTER — Other Ambulatory Visit: Payer: Self-pay

## 2019-08-04 DIAGNOSIS — M6281 Muscle weakness (generalized): Secondary | ICD-10-CM

## 2019-08-04 DIAGNOSIS — M25552 Pain in left hip: Secondary | ICD-10-CM | POA: Diagnosis not present

## 2019-08-04 NOTE — Therapy (Signed)
[place], [place], [address] Phone: [phone]   Fax:  [phone]  Physical Therapy Treatment  Patient Details  Name: Andrew Love MRN: [id number] Date of Birth: [birth year] Referring Provider (PT): Marchia Bond, MD   Encounter Date: 08/04/2019  PT End of Session - 08/04/19 1706    Visit Number  3    Number of Visits  18    Date for PT Re-Evaluation  09/08/19    Authorization Type  Generic [employer]: 30 visit limit    Authorization Time Period  07/28/19 to 09/08/19    Authorization - Visit Number  3    Authorization - Number of Visits  30    Progress Note Due on Visit  10    PT Start Time  1625    PT Stop Time  1703    PT Time Calculation (min)  38 min    Equipment Utilized During Treatment  Other (comment);Gait belt   RW   Activity Tolerance  Patient limited by pain;Patient tolerated treatment well;Patient limited by fatigue    Behavior During Therapy  [facility] for tasks assessed/performed;Restless       Past Medical History:  Diagnosis Date  . Arthritis   . Back pain    chronic  . Bronchitis    history of  . CAD (coronary artery disease)   . Chronic back pain   . Chronic insomnia    Per medical history form dated 06/13/10.  . Chronic left hip pain   . Colitis    Per medical history from dated 06/13/10.  . Diverticulitis    Hx of; requiring 3 admissions  . Elevated liver enzymes   . GERD (gastroesophageal reflux disease)   . Hypertension   . Hypothyroidism   . Left leg pain    chronic  . 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 Pneumonia 02/2019  . Psoriasis    Per medical history form dated 06/13/10.  . Sigmoid colon ulcer    Rectal polyps  . Sleep apnea with use of continuous positive airway pressure (CPAP)   . Urticaria     Past Surgical History:  Procedure Laterality Date  . APPENDECTOMY     8th grade  . BACK SURGERY    . CARDIAC CATHETERIZATION  2009  . CARDIAC  CATHETERIZATION     Per medical history from dated 06/13/10.  . CERVICAL SPINE SURGERY     C4, C5, C6 spinal fusion  . CHOLECYSTECTOMY N/A 08/31/2015   Procedure: LAPAROSCOPIC CHOLECYSTECTOMY WITH INTRAOPERATIVE CHOLANGIOGRAM;  Surgeon: Excell Seltzer, MD;  Location: [facility];  Service: General;  Laterality: N/A;  . COLONOSCOPY  07/2008   Colitis,NSAID v. Ischemia,malrotation of the gut,Diverticulosis(L),hyperplastic  . COLONOSCOPY N/A 12/13/2012   Dr. Oneida Alar: Normal TI, mild sigmoid diverticulosis, hemorrhoids, 2 polyps (tubular adenoma). Random colon bx negative. Next TCS 12/2022 with Fentanyl/phenergan  . ESOPHAGOGASTRODUODENOSCOPY N/A 04/16/2014   RMR: Erosive reflux esophagitis. Non critical Schzki's ring not manipulated. Small hiatal hernia. Abnormal gastirc mucosa of doubtful signigicance status post biopsy. I suspect trivial upper GI bleed. Recent abdominal pain presumably secondary to a protracted bout of diverticulitis. CT scan November 23 revealed improvemetn without complication. He finished his antibiotics 2 days age. Left sided ab  . JOINT REPLACEMENT    . LAPAROSCOPIC SIGMOID COLECTOMY  2012   Dr. Fanny Skates: recurrent sigmoid diverticulitis  . LAPAROSCOPIC SIGMOID COLECTOMY  2012   recurernt sigmoid diverticulitis, Dr. Dalbert Batman   . Cherre Robins  CUFF REPAIR     Left - per medical history form dated 06/13/10.  Marland Kitchen SHOULDER SURGERY     Left  . THYROIDECTOMY     Per medical history form dated 06/13/10.  Marland Kitchen TOTAL HIP ARTHROPLASTY  07/04/2011   Procedure: TOTAL HIP ARTHROPLASTY;  Surgeon: Johnny Bridge, MD;  Location: [place];  Service: Orthopedics;  Laterality: Right;  . TOTAL HIP ARTHROPLASTY Left 07/22/2019   Procedure: TOTAL HIP ARTHROPLASTY;  Surgeon: Marchia Bond, MD;  Location: [facility];  Service: Orthopedics;  Laterality: Left;    There were no vitals filed for this visit.                    [place] Adult PT Treatment/Exercise - 08/04/19 0001      Knee/Hip Exercises:  Standing   Heel Raises  Both;10 reps    Knee Flexion  Both;10 reps    Forward Lunges  Both;10 reps;2 sets;5 seconds;Limitations    Forward Lunges Limitations  with 4" step    Hip Abduction  Both;10 reps    Lateral Step Up  Both;Hand Hold: 1;10 reps;Step Height: 4"    Rocker Board  2 minutes    Rocker Board Limitations  lateral and DF/PF    Other Standing Knee Exercises  tandem gait 30"X 2 each LE lead      Knee/Hip Exercises: Seated   Long Arc Quad  10 reps;Left               PT Short Term Goals - 07/28/19 1511      PT SHORT TERM GOAL #1   Title  Pt will perform HEP correctly and consistently at least 3x/week, progressing as needed to improve strength and reduce pain with functional activites.    Time  3    Period  Weeks    Status  New    Target Date  08/18/19      PT SHORT TERM GOAL #2   Title  Pt will perform 5x STS in 45 sec, from elevated surface, with equal weight-bearing to demo improved functional strength with transfers.    Time  3    Period  Weeks    Status  New        PT Long Term Goals - 07/28/19 1513      PT LONG TERM GOAL #1   Title  Pt will demo L hip strength 4/5 and L knee strength 5/5 to improve gait mechanics and tolerance to ambulation.    Time  6    Period  Weeks    Status  New    Target Date  09/08/19      PT LONG TERM GOAL #2   Title  Pt will perform 3MWT with LRAD at 1.5m/s gait speed to reduce risk for falls when ambulating in the community.    Time  6    Period  Weeks    Status  New      PT LONG TERM GOAL #3   Title  Pt will self report ability to ambulate for 30 minutes in order to complete grocery shopping or light activities around the home with 5/10 pain at worst to progress towards PLOF.    Time  6    Period  Weeks    Status  New      PT LONG TERM GOAL #4   Title  Pt will score 25% limited on FOTO to significantly improve pain and improve ability to perform functional activities around  the home.    Time  6    Period   Weeks    Status  New            Plan - 08/04/19 1707    Clinical Impression Statement  Pt returned today with verbalized fatigue from "going all day".  Went to MD office earlier this morning and MD pleased with progress.  Continued with therex with added tandem gait and additional hip and LE strengthening exercises.  PT able to complete all without pain and only one short seated rest break towards end of session.    Personal Factors and Comorbidities  Comorbidity 3+;Time since onset of injury/illness/exacerbation    Comorbidities  arthritis, back pain, HTN, R THA 2013    Examination-Activity Limitations  Bathing;Bed Mobility;Bend;Carry;Dressing;Locomotion Level;Sleep;Stairs;Stand;Transfers    Examination-Participation Restrictions  Cleaning;Driving;Meal Prep;Yard Work;Other    Stability/Clinical Decision Making  Stable/Uncomplicated    Rehab Potential  Good    PT Frequency  3x / week    PT Duration  6 weeks    PT Treatment/Interventions  ADLs/Self Care Home Management;Aquatic Therapy;Biofeedback;Cryotherapy;Moist Heat;DME Instruction;Gait training;Stair training;Functional mobility training;Therapeutic activities;Therapeutic exercise;Balance training;Neuromuscular re-education;Patient/family education;Orthotic Fit/Training;Manual techniques;Scar mobilization;Passive range of motion;Dry needling;Taping;Joint Manipulations    PT Next Visit Plan  Begin LLE strengthening, gait training, use manual as needed for pain relief. Monitor DVT and signs for infection. Decrease frequency as able due to visit limit.  Begin vectors and SLS next visit    PT Home Exercise Plan  Eval: continue acute HEP, weight-shifting with BUE support       Patient will benefit from skilled therapeutic intervention in order to improve the following deficits and impairments:  Abnormal gait, Decreased activity tolerance, Decreased balance, Decreased endurance, Decreased mobility, Decreased range of motion, Decreased  strength, Difficulty walking, Hypomobility, Increased edema, Increased muscle spasms, Impaired perceived functional ability, Impaired flexibility, Impaired sensation, Pain  Visit Diagnosis: Muscle weakness (generalized)  Pain in left hip     Problem List Patient Active Problem List   Diagnosis Date Noted  . Osteoarthritis of left hip 07/22/2019  . S/P total hip arthroplasty 07/22/2019  . Food intolerance 07/09/2019  . Chronic urticaria 07/09/2019  . Seasonal and perennial allergic rhinitis 07/09/2019  . Elevated liver enzymes   . Left sided abdominal pain 04/18/2019  . Diarrhea due to COVID-19 03/11/2019  . Vomiting 03/10/2019  . COVID-19 virus infection 03/10/2019  . Hypokalemia 03/10/2019  . AKI (acute kidney injury) ([place]) 03/10/2019  . Metabolic acidosis 99991111  . Lumbar radiculopathy 01/26/2019  . LUQ pain 06/19/2016  . Symptomatic cholelithiasis 08/30/2015  . Preoperative cardiovascular examination   . Coronary artery disease involving native coronary artery of native heart without angina pectoris   . Gastroenteritis 08/26/2015  . Cholelithiases 08/24/2015  . Abdominal pain 08/24/2015  . Cholecystitis, acute 08/24/2015  . Diarrhea 08/24/2015  . Acute cholecystitis 08/24/2015  . Nausea without vomiting 04/08/2015  . Diverticulitis of colon   . Cerebral thrombosis with cerebral infarction ([place]) 03/21/2014  . Stroke ([place]) 03/21/2014  . Essential hypertension 03/21/2014  . TIA (transient ischemic attack)   . HLD (hyperlipidemia)   . Thyroid activity decreased   . Colitis 11/20/2012  . CAD (coronary artery disease)   . Hip pain 08/04/2011  . 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 LIVER FUNCTION TESTS, ABNORMAL, HX OF 12/17/2008  Teena Irani, PTA/CLT [phone]  Teena Irani 08/04/2019, 5:13 PM  [place], [place],  [place] Phone: [phone]   Fax:   [phone]  Name: Andrew Love MRN: [id number] Date of Birth: [birth year]

## 2019-08-06 ENCOUNTER — Emergency Department (HOSPITAL_COMMUNITY): Payer: 59

## 2019-08-06 ENCOUNTER — Emergency Department (HOSPITAL_COMMUNITY)
Admission: EM | Admit: 2019-08-06 | Discharge: 2019-08-06 | Disposition: A | Discharge status: Home / Self Care | Payer: 59 | Attending: Emergency Medicine | Admitting: Emergency Medicine

## 2019-08-06 ENCOUNTER — Encounter (HOSPITAL_COMMUNITY): Payer: Self-pay | Admitting: Emergency Medicine

## 2019-08-06 ENCOUNTER — Other Ambulatory Visit: Payer: Self-pay

## 2019-08-06 ENCOUNTER — Ambulatory Visit: Payer: 59 | Admitting: Allergy & Immunology

## 2019-08-06 ENCOUNTER — Ambulatory Visit: Payer: Self-pay

## 2019-08-06 DIAGNOSIS — R42 Dizziness and giddiness: Secondary | ICD-10-CM | POA: Insufficient documentation

## 2019-08-06 DIAGNOSIS — E039 Hypothyroidism, unspecified: Secondary | ICD-10-CM | POA: Diagnosis not present

## 2019-08-06 DIAGNOSIS — Z9889 Other specified postprocedural states: Secondary | ICD-10-CM | POA: Insufficient documentation

## 2019-08-06 DIAGNOSIS — R079 Chest pain, unspecified: Secondary | ICD-10-CM

## 2019-08-06 DIAGNOSIS — I2510 Atherosclerotic heart disease of native coronary artery without angina pectoris: Secondary | ICD-10-CM | POA: Insufficient documentation

## 2019-08-06 DIAGNOSIS — Z79899 Other long term (current) drug therapy: Secondary | ICD-10-CM | POA: Insufficient documentation

## 2019-08-06 DIAGNOSIS — Z7982 Long term (current) use of aspirin: Secondary | ICD-10-CM | POA: Diagnosis not present

## 2019-08-06 DIAGNOSIS — R0789 Other chest pain: Secondary | ICD-10-CM | POA: Diagnosis present

## 2019-08-06 DIAGNOSIS — R0602 Shortness of breath: Secondary | ICD-10-CM | POA: Insufficient documentation

## 2019-08-06 DIAGNOSIS — I10 Essential (primary) hypertension: Secondary | ICD-10-CM | POA: Diagnosis not present

## 2019-08-06 DIAGNOSIS — M79604 Pain in right leg: Secondary | ICD-10-CM | POA: Insufficient documentation

## 2019-08-06 LAB — CBC WITH DIFFERENTIAL/PLATELET
Abs Immature Granulocytes: 0.07 10*3/uL (ref 0.00–0.07)
Basophils Absolute: 0.1 10*3/uL (ref 0.0–0.1)
Basophils Relative: 1 %
Eosinophils Absolute: 0.3 10*3/uL (ref 0.0–0.5)
Eosinophils Relative: 3 %
HCT: 39.7 % (ref 39.0–52.0)
Hemoglobin: 13.5 g/dL (ref 13.0–17.0)
Immature Granulocytes: 1 %
Lymphocytes Relative: 12 %
Lymphs Abs: 1.3 10*3/uL (ref 0.7–4.0)
MCH: 31.8 pg (ref 26.0–34.0)
MCHC: 34 g/dL (ref 30.0–36.0)
MCV: 93.6 fL (ref 80.0–100.0)
Monocytes Absolute: 0.8 10*3/uL (ref 0.1–1.0)
Monocytes Relative: 8 %
Neutro Abs: 7.9 10*3/uL — ABNORMAL HIGH (ref 1.7–7.7)
Neutrophils Relative %: 75 %
Platelets: 303 10*3/uL (ref 150–400)
RBC: 4.24 MIL/uL (ref 4.22–5.81)
RDW: 13.5 % (ref 11.5–15.5)
WBC: 10.5 10*3/uL (ref 4.0–10.5)
nRBC: 0 % (ref 0.0–0.2)

## 2019-08-06 LAB — TROPONIN I (HIGH SENSITIVITY)
Troponin I (High Sensitivity): 3 ng/L (ref <18)
Troponin I (High Sensitivity): 5 ng/L (ref <18)

## 2019-08-06 LAB — BASIC METABOLIC PANEL
Anion gap: 11 (ref 5–15)
BUN: 16 mg/dL (ref 6–20)
CO2: 26 mmol/L (ref 22–32)
Calcium: 8.8 mg/dL — ABNORMAL LOW (ref 8.9–10.3)
Chloride: 96 mmol/L — ABNORMAL LOW (ref 98–111)
Creatinine, Ser: 1.19 mg/dL (ref 0.61–1.24)
GFR calc Af Amer: >60 mL/min (ref >60)
GFR calc non Af Amer: >60 mL/min (ref >60)
Glucose, Bld: 112 mg/dL — ABNORMAL HIGH (ref 70–99)
Potassium: 3.8 mmol/L (ref 3.5–5.1)
Sodium: 133 mmol/L — ABNORMAL LOW (ref 135–145)

## 2019-08-06 LAB — HEPATIC FUNCTION PANEL
ALT: 41 U/L (ref 0–44)
AST: 24 U/L (ref 15–41)
Albumin: 3.6 g/dL (ref 3.5–5.0)
Alkaline Phosphatase: 83 U/L (ref 38–126)
Bilirubin, Direct: 0.1 mg/dL (ref 0.0–0.2)
Indirect Bilirubin: 0.8 mg/dL (ref 0.3–0.9)
Total Bilirubin: 0.9 mg/dL (ref 0.3–1.2)
Total Protein: 6.9 g/dL (ref 6.5–8.1)

## 2019-08-06 LAB — LIPASE, BLOOD: Lipase: 17 U/L (ref 11–51)

## 2019-08-06 MED ORDER — FAMOTIDINE IN NACL 20-0.9 MG/50ML-% IV SOLN
20.0000 mg | Freq: Once | INTRAVENOUS | Status: AC
  Administered 2019-08-06: 20 mg via INTRAVENOUS
  Filled 2019-08-06: qty 50

## 2019-08-06 MED ORDER — DIPHENHYDRAMINE HCL 25 MG PO CAPS: 50.0000 mg | ORAL_CAPSULE | Freq: Once | ORAL | Status: AC

## 2019-08-06 MED ORDER — ALUM & MAG HYDROXIDE-SIMETH 200-200-20 MG/5ML PO SUSP
30.0000 mL | Freq: Once | ORAL | Status: AC
  Administered 2019-08-06: 30 mL via ORAL
  Filled 2019-08-06: qty 30

## 2019-08-06 MED ORDER — SODIUM CHLORIDE 0.9 % IV BOLUS
500.0000 mL | Freq: Once | INTRAVENOUS | Status: AC
  Administered 2019-08-06: 500 mL via INTRAVENOUS

## 2019-08-06 MED ORDER — FENTANYL CITRATE (PF) 100 MCG/2ML IJ SOLN
50.0000 ug | Freq: Once | INTRAMUSCULAR | Status: AC
  Administered 2019-08-06: 50 ug via INTRAVENOUS
  Filled 2019-08-06: qty 2

## 2019-08-06 MED ORDER — ONDANSETRON HCL 4 MG/2ML IJ SOLN
4.0000 mg | Freq: Once | INTRAMUSCULAR | Status: AC
  Administered 2019-08-06: 4 mg via INTRAVENOUS
  Filled 2019-08-06: qty 2

## 2019-08-06 MED ORDER — IOHEXOL 350 MG/ML SOLN
75.0000 mL | Freq: Once | INTRAVENOUS | Status: AC | PRN
  Administered 2019-08-06: 75 mL via INTRAVENOUS

## 2019-08-06 MED ORDER — HYDROCORTISONE NA SUCCINATE PF 250 MG IJ SOLR
200.0000 mg | Freq: Once | INTRAMUSCULAR | Status: AC
  Administered 2019-08-06: 200 mg via INTRAVENOUS
  Filled 2019-08-06: qty 200

## 2019-08-06 MED ORDER — DIPHENHYDRAMINE HCL 50 MG/ML IJ SOLN
50.0000 mg | Freq: Once | INTRAMUSCULAR | Status: AC
  Administered 2019-08-06: 50 mg via INTRAVENOUS
  Filled 2019-08-06: qty 1

## 2019-08-06 MED ORDER — LIDOCAINE VISCOUS HCL 2 % MT SOLN
15.0000 mL | Freq: Once | OROMUCOSAL | Status: AC
  Administered 2019-08-06: 15 mL via ORAL
  Filled 2019-08-06: qty 15

## 2019-08-06 NOTE — ED Provider Notes (Signed)
[facility] Provider Note   CSN: [id number] Arrival date & time: 08/06/19  1309     History Chief Complaint  Patient presents with  . Chest Pain    Andrew Love is a 54 y.o. male history obesity, GERD, CAD, cholecystectomy.  Patient presents today for sudden onset chest pain, left-sided pressure sensation on 12 PM, pain constant nonradiating no clear aggravating or alleviating factors.  Associated with diaphoresis, shortness of breath and belching.  Denies similar pain in the past, reports he was eating lunch approximately 11 AM prior to onset of pain.  He reports he takes daily aspirin and had taken 324 mg prior to EMS arrival, he reports that he was then given another 324 mg aspirin along with 1 nitro without improvement of symptoms.  Of note patient had left hip replacement 2 weeks ago and has had swelling of the left leg since that time.  Denies fever/chills, headache, neck pain, cough/hemoptysis, vomiting, diarrhea, numbness/tingling, weakness or any additional concerns.  HPI     Past Medical History:  Diagnosis Date  . Arthritis   . Back pain    chronic  . Bronchitis    history of  . CAD (coronary artery disease)   . Chronic back pain   . Chronic insomnia    Per medical history form dated 06/13/10.  . Chronic left hip pain   . Colitis    Per medical history from dated 06/13/10.  . Diverticulitis    Hx of; requiring 3 admissions  . Elevated liver enzymes   . GERD (gastroesophageal reflux disease)   . Hypertension   . Hypothyroidism   . Left leg pain    chronic  . 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 Pneumonia 02/2019  . Psoriasis    Per medical history form dated 06/13/10.  . Sigmoid colon ulcer    Rectal polyps  . Sleep apnea with use of continuous positive airway pressure (CPAP)   . Urticaria     Patient Active Problem List   Diagnosis Date Noted  . Osteoarthritis of left hip 07/22/2019  . S/P total hip  arthroplasty 07/22/2019  . Food intolerance 07/09/2019  . Chronic urticaria 07/09/2019  . Seasonal and perennial allergic rhinitis 07/09/2019  . Elevated liver enzymes   . Left sided abdominal pain 04/18/2019  . Diarrhea due to COVID-19 03/11/2019  . Vomiting 03/10/2019  . COVID-19 virus infection 03/10/2019  . Hypokalemia 03/10/2019  . AKI (acute kidney injury) ([place]) 03/10/2019  . Metabolic acidosis 99991111  . Lumbar radiculopathy 01/26/2019  . LUQ pain 06/19/2016  . Symptomatic cholelithiasis 08/30/2015  . Preoperative cardiovascular examination   . Coronary artery disease involving native coronary artery of native heart without angina pectoris   . Gastroenteritis 08/26/2015  . Cholelithiases 08/24/2015  . Abdominal pain 08/24/2015  . Cholecystitis, acute 08/24/2015  . Diarrhea 08/24/2015  . Acute cholecystitis 08/24/2015  . Nausea without vomiting 04/08/2015  . Diverticulitis of colon   . Cerebral thrombosis with cerebral infarction ([place]) 03/21/2014  . Stroke ([place]) 03/21/2014  . Essential hypertension 03/21/2014  . TIA (transient ischemic attack)   . HLD (hyperlipidemia)   . Thyroid activity decreased   . Colitis 11/20/2012  . CAD (coronary artery disease)   . Hip pain 08/04/2011  . 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 LIVER FUNCTION TESTS, ABNORMAL, HX OF 12/17/2008    Past Surgical History:  Procedure Laterality Date  . APPENDECTOMY     8th  grade  . BACK SURGERY    . CARDIAC CATHETERIZATION  2009  . CARDIAC CATHETERIZATION     Per medical history from dated 06/13/10.  . CERVICAL SPINE SURGERY     C4, C5, C6 spinal fusion  . CHOLECYSTECTOMY N/A 08/31/2015   Procedure: LAPAROSCOPIC CHOLECYSTECTOMY WITH INTRAOPERATIVE CHOLANGIOGRAM;  Surgeon: Excell Seltzer, MD;  Location: [facility];  Service: General;  Laterality: N/A;  . COLONOSCOPY  07/2008   Colitis,NSAID v. Ischemia,malrotation of the gut,Diverticulosis(L),hyperplastic  .  COLONOSCOPY N/A 12/13/2012   Dr. Oneida Alar: Normal TI, mild sigmoid diverticulosis, hemorrhoids, 2 polyps (tubular adenoma). Random colon bx negative. Next TCS 12/2022 with Fentanyl/phenergan  . ESOPHAGOGASTRODUODENOSCOPY N/A 04/16/2014   RMR: Erosive reflux esophagitis. Non critical Schzki's ring not manipulated. Small hiatal hernia. Abnormal gastirc mucosa of doubtful signigicance status post biopsy. I suspect trivial upper GI bleed. Recent abdominal pain presumably secondary to a protracted bout of diverticulitis. CT scan November 23 revealed improvemetn without complication. He finished his antibiotics 2 days age. Left sided ab  . JOINT REPLACEMENT    . LAPAROSCOPIC SIGMOID COLECTOMY  2012   Dr. Fanny Skates: recurrent sigmoid diverticulitis  . LAPAROSCOPIC SIGMOID COLECTOMY  2012   recurernt sigmoid diverticulitis, Dr. Dalbert Batman   . ROTATOR CUFF REPAIR     Left - per medical history form dated 06/13/10.  Marland Kitchen SHOULDER SURGERY     Left  . THYROIDECTOMY     Per medical history form dated 06/13/10.  Marland Kitchen TOTAL HIP ARTHROPLASTY  07/04/2011   Procedure: TOTAL HIP ARTHROPLASTY;  Surgeon: Johnny Bridge, MD;  Location: [facility];  Service: Orthopedics;  Laterality: Right;  . TOTAL HIP ARTHROPLASTY Left 07/22/2019   Procedure: TOTAL HIP ARTHROPLASTY;  Surgeon: Marchia Bond, MD;  Location: [facility];  Service: Orthopedics;  Laterality: Left;       Family History  Problem Relation Age of Onset  . Cancer Mother        breast cancer - per medical history form dated 06/13/10.  . Diverticulitis Mother   . Hypertension Mother   . Cancer Father        skin - per medical history form dated 06/13/10.  Marland Kitchen Hypertension Father   . Anesthesia problems Neg Hx   . Hypotension Neg Hx   . Malignant hyperthermia Neg Hx   . Pseudochol deficiency Neg Hx   . Colon cancer Neg Hx     Social History   Tobacco Use  . Smoking status: Former Smoker    Packs/day: 0.50    Years: 18.00    Pack years: 9.00    Types: Cigarettes     Quit date: 02/15/2019    Years since quitting: 0.4  . Smokeless tobacco: Never Used  . Tobacco comment:    Substance Use Topics  . Alcohol use: No    Alcohol/week: 0.0 standard drinks  . Drug use: No    Home Medications Prior to Admission medications   Medication Sig Start Date End Date Taking? Authorizing Provider  acetaminophen (TYLENOL) 500 MG tablet Take 500-1,000 mg by mouth every 6 (six) hours as needed for mild pain or fever.    [provider]  amoxicillin-clavulanate (AUGMENTIN) 875-125 MG tablet Take 1 tablet by mouth 2 (two) times daily. One po bid x 7 days Patient not taking: Reported on 07/09/2019 04/10/19   Jacqlyn Larsen, PA-C  aspirin EC 325 MG tablet Take 1 tablet (325 mg total) by mouth 2 (two) times daily. 07/23/19   Ventura Bruns, PA-C  baclofen (LIORESAL) 10 MG tablet Take 1 tablet (10 mg total) by mouth 3 (three) times daily. As needed for muscle spasm 07/23/19   Merlene Pulling K, PA-C  benzonatate (TESSALON) 100 MG capsule Take 1 capsule (100 mg total) by mouth every 8 (eight) hours. Patient not taking: Reported on 07/09/2019 02/26/19   Wurst, [place], PA-C  cetirizine (ZYRTEC) 10 MG chewable tablet Chew 1 tablet (10 mg total) by mouth daily. Patient taking differently: Chew 10 mg by mouth daily as needed for allergies.  02/26/19   Wurst, [place], PA-C  dexlansoprazole (DEXILANT) 60 MG capsule Take 1 capsule (60 mg total) by mouth daily before breakfast. Patient taking differently: Take 60 mg by mouth daily as needed (heart burn).  04/18/19   Mahala Menghini, PA-C  dicyclomine (BENTYL) 10 MG capsule Take 1 capsule (10 mg total) by mouth 4 (four) times daily -  before meals and at bedtime. As needed for loose stool, abdominal cramping. Hold if constipation. 04/18/19   Mahala Menghini, PA-C  EPINEPHrine (EPIPEN) 0.3 mg/0.3 mL SOAJ Inject 0.3 mLs (0.3 mg total) into the muscle as needed. Patient taking differently: Inject 0.3 mg into the muscle as needed for  anaphylaxis.  12/01/12   Teressa Lower, MD  fluticasone (FLONASE) 50 MCG/ACT nasal spray Place 2 sprays into both nostrils daily. Patient taking differently: Place 2 sprays into both nostrils daily as needed for allergies.  02/26/19   Wurst, [place], PA-C  HYDROmorphone (DILAUDID) 2 MG tablet Take 1 tablet (2 mg total) by mouth every 4 (four) hours as needed for severe pain. 07/23/19   Ventura Bruns, PA-C  levothyroxine (SYNTHROID, LEVOTHROID) 300 MCG tablet Take 300 mcg by mouth every morning.  03/26/14   [provider]  meclizine (ANTIVERT) 25 MG tablet Take 1 tablet (25 mg total) by mouth 3 (three) times daily as needed for dizziness. 04/09/17   Isla Pence, MD  olmesartan (BENICAR) 20 MG tablet Take 20 mg by mouth daily.     [provider]  ondansetron (ZOFRAN) 4 MG tablet Take 1 tablet (4 mg total) by mouth every 6 (six) hours. Patient taking differently: Take 4 mg by mouth every 6 (six) hours as needed for nausea or vomiting.  01/07/19   Wurst, [place], PA-C  promethazine (PHENERGAN) 25 MG tablet Take 1 tablet (25 mg total) by mouth every 6 (six) hours as needed for nausea or vomiting. 04/10/19   Jacqlyn Larsen, PA-C  sennosides-docusate sodium (SENOKOT-S) 8.6-50 MG tablet Take 2 tablets by mouth daily. 07/23/19   Merlene Pulling K, PA-C  zolpidem (AMBIEN) 10 MG tablet Take 10 mg by mouth at bedtime.     [provider]    Allergies    Other, Iohexol, Oysters [shellfish allergy], Wheat bran, and Yeast-related products  Review of Systems   Review of Systems Ten systems are reviewed and are negative for acute change except as noted in the HPI  Physical Exam Updated Vital Signs BP 113/81   Pulse (!) 103   Temp 98.7 F (37.1 C) (Oral)   Resp 20   Ht 6' (1.829 m)   Wt 106.1 kg   SpO2 98%   BMI 31.74 kg/m   Physical Exam Constitutional:      General: He is not in acute distress.    Appearance: Normal appearance. He is well-developed. He is not  ill-appearing or diaphoretic.  HENT:     Head: Normocephalic and atraumatic.     Right Ear: External ear normal.  Left Ear: External ear normal.     Nose: Nose normal.  Eyes:     General: Vision grossly intact. Gaze aligned appropriately.     Pupils: Pupils are equal, round, and reactive to light.  Neck:     Trachea: Trachea and phonation normal. No tracheal deviation.  Cardiovascular:     Rate and Rhythm: Regular rhythm. Tachycardia present.     Pulses:          Dorsalis pedis pulses are 2+ on the right side and 2+ on the left side.  Pulmonary:     Effort: Pulmonary effort is normal. No accessory muscle usage or respiratory distress.     Breath sounds: Normal breath sounds.  Abdominal:     General: There is no distension.     Palpations: Abdomen is soft.     Tenderness: There is no abdominal tenderness. There is no guarding or rebound.  Musculoskeletal:        General: Normal range of motion.     Cervical back: Normal range of motion.     Right lower leg: No tenderness. No edema.     Left lower leg: No tenderness. Edema present.  Skin:    General: Skin is warm and dry.  Neurological:     Mental Status: He is alert.     GCS: GCS eye subscore is 4. GCS verbal subscore is 5. GCS motor subscore is 6.     Comments: Speech is clear and goal oriented, follows commands Major Cranial nerves without deficit, no facial droop Moves extremities without ataxia, coordination intact  Psychiatric:        Behavior: Behavior normal.     ED Results / Procedures / Treatments   Labs (all labs ordered are listed, but only abnormal results are displayed) Labs Reviewed  BASIC METABOLIC PANEL  CBC WITH DIFFERENTIAL/PLATELET  I-STAT CHEM 8, ED  TROPONIN I (HIGH SENSITIVITY)    EKG EKG Interpretation  Date/Time:  Wednesday August 06 2019 13:33:11 EDT Ventricular Rate:  98 PR Interval:    QRS Duration: 91 QT Interval:  343 QTC Calculation: 438 R Axis:   -16 Text  Interpretation: Sinus rhythm Borderline left axis deviation Probable lateral infarct, old No significant change since prior 11/20 Confirmed by Aletta Edouard [phone]) on 08/06/2019 2:31:37 PM   Radiology No results found.  Procedures Procedures (including critical care time)  Medications Ordered in ED Medications - No data to display  ED Course  I have reviewed the triage vital signs and the nursing notes.  Pertinent labs & imaging results that were available during my care of the patient were reviewed by me and considered in my medical decision making (see chart for details).  Clinical Course as of Aug 05 1624  Wed Aug 06, 2019  1351 EKG is normal sinus rhythm rate of 98 T wave inversion in aVL otherwise no acute ST-T changes.   [MB]  329 54 year old male with recent hip surgery here with some chest pressure lightheaded dizzy nauseous starting today.  Associated with some belching.  Diaphoresis.  Initial EKG does not show any ST elevations.  Has a contrast allergy so will need pretreatment.  Getting ultrasound labs including troponin.   [MB]    Clinical Course User Index [MB] Hayden Rasmussen, MD   MDM Rules/Calculators/A&P                     54 year old male with history as detailed above presents today for sudden onset chest  pain shortness of breath diaphoresis and belching that began at noon today.  Had eaten lunch 1 hour prior.  Additionally he had hip replacement 2 weeks ago and has had left leg swelling since that time.  On evaluation he is anxious appearing, cranial nerves intact, no meningeal signs, tachycardic regular rate and rhythm, lungs clear, abdomen soft nontender without peritoneal signs, neurovascular intact to all 4 extremities, 1+ left leg edema compared to right.  Possible that patient's symptoms today may be secondary to reflux however given sudden onset of symptoms, associated with shortness of breath diaphoresis and with recent surgery do feel need to rule out  pulmonary embolism.  Chest pain work-up including CBC, BMP, troponin, chest x-ray and EKG ordered.  Will obtain CT angio to assess for PE, patient has a contrast allergy so will need to premedicate prior.  Discussed plan with Dr. Melina Copa who is in agreement. - Initial troponin within normal limits will need delta troponin given timing of symptoms. LFTs within normal limits, CBC shows no evidence of anemia or leukocytosis suggest infection.  BMP shows no emergent electrolyte abnormalities or elevation of kidney function.  Lipase within normal limits no evidence of pancreatitis.  CXR:  IMPRESSION:  No active disease.  I personally reviewed patient's chest x-ray and agree with radiologist dictation  EKG: Sinus rhythm Borderline left axis deviation Probable lateral infarct, old No significant change since prior 11/20 Confirmed by Aletta Edouard [phone]) on 08/06/2019 2:31:37 PM  Left lower extremity DVT study:  IMPRESSION:  No evidence of deep venous thrombosis.  - Patient was given GI cocktail, Pepcid, Zofran and fentanyl.  On reassessment vital signs improved, he is more relaxed appearing no acute distress.  Still awaiting CT scan for evaluation of possible pulmonary embolism.  Vital signs are stable no tachycardia or hypoxia on room air.  Care handoff given to [place] PA-C at shift change, delta Troponin and CT scan pending.  Disposition per oncoming team.  Patient was seen and evaluated by Dr. Melina Copa during this visit.  Note: Portions of this report may have been transcribed using voice recognition software. Every effort was made to ensure accuracy; however, inadvertent computerized transcription errors may still be present. Final Clinical Impression(s) / ED Diagnoses Final diagnoses:  None    Rx / DC Orders ED Discharge Orders    None       Gari Crown 08/06/19 1633    Hayden Rasmussen, MD 08/06/19 1911

## 2019-08-06 NOTE — ED Provider Notes (Signed)
Patient signed out to me by Nuala Alpha, PA-C ending completion of work-up.   Patient is a 54 year old male brought in by EMS with chest pressure, dyspnea and diaphoresis.  His symptoms began around noon today.  He is status post total hip arthroplasty on 07/22/2019 and has also had swelling of the left leg since his procedure.  First troponin is within normal limits and EKG shows a normal sinus rhythm without acute ST and T wave changes.  Clinical exam is concerning for PE, will obtain CT angio to further assess, but patient has contrast allergy he has been premedicated per protocol.    1930 patient resting comfortably.  Delta troponin is wnml.  CT angio of chest is negative for PE.  He admits that he has been taking his pain medication every 8 hrs instead of every 4 hours.  Cause of sx's is unclear, possiblity anxiety related.  Patient will be discharged home with close outpatient follow-up.   Labs Reviewed  BASIC METABOLIC PANEL - Abnormal; Notable for the following components:      Result Value   Sodium 133 (*)    Chloride 96 (*)    Glucose, Bld 112 (*)    Calcium 8.8 (*)    All other components within normal limits  CBC WITH DIFFERENTIAL/PLATELET - Abnormal; Notable for the following components:   Neutro Abs 7.9 (*)    All other components within normal limits  LIPASE, BLOOD  HEPATIC FUNCTION PANEL  I-STAT CHEM 8, ED  TROPONIN I (HIGH SENSITIVITY)  TROPONIN I (HIGH SENSITIVITY)     CT Angio Chest PE W and/or Wo Contrast  Result Date: 08/06/2019 CLINICAL DATA:  Chest pain radiating to the left for several hours, history of prior COVID-19 positivity EXAM: CT ANGIOGRAPHY CHEST WITH CONTRAST TECHNIQUE: Multidetector CT imaging of the chest was performed using the standard protocol during bolus administration of intravenous contrast. Multiplanar CT image reconstructions and MIPs were obtained to evaluate the vascular anatomy. CONTRAST:  80mL Isovue 370. Patient was  pre-medicated with the margin contrast allergy prep COMPARISON:  Chest x-ray from earlier in the same day. FINDINGS: Cardiovascular: Thoracic aorta and its branches are well visualize without aneurysmal dilatation or dissection. No cardiac enlargement is seen. No significant coronary calcifications are noted. The pulmonary artery shows a normal branching pattern. No focal filling defects to suggest pulmonary emboli are identified. Mediastinum/Nodes: The thoracic inlet is within normal limits. No hilar or mediastinal adenopathy is seen. The esophagus as visualized is within normal limits. Lungs/Pleura: The lungs are well aerated bilaterally. No focal confluent infiltrate or sizable effusion is seen. Some residual right upper lobe ground-glass opacities are seen consistent with the patient's given clinical history. No sizable parenchymal nodules are noted. Upper Abdomen: Visualized upper abdomen is unremarkable. Musculoskeletal: Degenerative changes of the thoracic spine are seen. No acute bony abnormality is noted. Review of the MIP images confirms the above findings. IMPRESSION: No evidence of pulmonary emboli. Patchy minimal ground-glass opacities consistent with the history of prior COVID-19 positivity. No acute abnormality noted. Electronically Signed   By: Inez Catalina M.D.   On: 08/06/2019 19:17     US Venous Img Lower Unilateral Left  Result Date: 08/06/2019 CLINICAL DATA:  Leg pain and swelling EXAM: LEFT LOWER EXTREMITY VENOUS DOPPLER ULTRASOUND TECHNIQUE: Gray-scale sonography with graded compression, as well as color Doppler and duplex ultrasound were performed to evaluate the lower extremity deep venous systems from the level of the common femoral vein and including the  common femoral, femoral, profunda femoral, popliteal and calf veins including the posterior tibial, peroneal and gastrocnemius veins when visible. The superficial great saphenous vein was also interrogated. Spectral Doppler was  utilized to evaluate flow at rest and with distal augmentation maneuvers in the common femoral, femoral and popliteal veins. COMPARISON:  None. FINDINGS: Contralateral Common Femoral Vein: Respiratory phasicity is normal and symmetric with the symptomatic side. No evidence of thrombus. Normal compressibility. Common Femoral Vein: No evidence of thrombus. Normal compressibility, respiratory phasicity and response to augmentation. Saphenofemoral Junction: No evidence of thrombus. Normal compressibility and flow on color Doppler imaging. Profunda Femoral Vein: No evidence of thrombus. Normal compressibility and flow on color Doppler imaging. Femoral Vein: No evidence of thrombus. Normal compressibility, respiratory phasicity and response to augmentation. Popliteal Vein: No evidence of thrombus. Normal compressibility, respiratory phasicity and response to augmentation. Calf Veins: No evidence of thrombus. Normal compressibility and flow on color Doppler imaging. Superficial Great Saphenous Vein: No evidence of thrombus. Normal compressibility. Other Findings:  None. IMPRESSION: No evidence of deep venous thrombosis. Electronically Signed   By: Constance Holster M.D.   On: 08/06/2019 15:08      Kem Parkinson, PA-C 08/06/19 Loran Senters, MD 08/06/19 [phone]

## 2019-08-06 NOTE — Discharge Instructions (Addendum)
Your work-up today was reassuring.  CT was negative for a blood clot in your lung.  Try taking your pain medication every 4-6 hours as directed.  Call your primary doctor to arrange a follow-up appointment.

## 2019-08-06 NOTE — ED Notes (Signed)
Patient transported to CT 

## 2019-08-06 NOTE — ED Triage Notes (Signed)
Pt brought in by [facility] with c/o mid chest pressure, n/v, dizziness, lightheadedness that started at 1200 today. Pt recently had left hip surgery middle of this month. 1 Nitro given by EMS with some relief in the chest pressure. SBP dropped into the 80's after 1 Nitro given so EMS then gave approximately 285ml of NS bolus. 324mg  ASA and Zofran given by EMS.

## 2019-08-07 ENCOUNTER — Telehealth (HOSPITAL_COMMUNITY): Payer: Self-pay | Admitting: Physical Therapy

## 2019-08-07 ENCOUNTER — Ambulatory Visit (HOSPITAL_COMMUNITY): Payer: 59 | Admitting: Physical Therapy

## 2019-08-07 NOTE — Telephone Encounter (Signed)
Pt cx due to not feeling well today- he went to hospital yesterday with chest pain and was released. Pt will be here at his next appointed time.

## 2019-08-11 ENCOUNTER — Other Ambulatory Visit: Payer: Self-pay

## 2019-08-11 ENCOUNTER — Ambulatory Visit (HOSPITAL_COMMUNITY): Payer: 59 | Attending: Orthopedic Surgery | Admitting: Physical Therapy

## 2019-08-11 ENCOUNTER — Encounter (HOSPITAL_COMMUNITY): Payer: Self-pay | Admitting: Physical Therapy

## 2019-08-11 DIAGNOSIS — M6281 Muscle weakness (generalized): Secondary | ICD-10-CM | POA: Diagnosis present

## 2019-08-11 DIAGNOSIS — R262 Difficulty in walking, not elsewhere classified: Secondary | ICD-10-CM | POA: Insufficient documentation

## 2019-08-11 DIAGNOSIS — R2689 Other abnormalities of gait and mobility: Secondary | ICD-10-CM | POA: Diagnosis present

## 2019-08-11 DIAGNOSIS — M25552 Pain in left hip: Secondary | ICD-10-CM | POA: Diagnosis present

## 2019-08-11 NOTE — Therapy (Signed)
[place], [place], [address] Phone: [phone]   Fax:  [phone]  Physical Therapy Treatment  Patient Details  Name: Andrew Love MRN: [id number] Date of Birth: 1965/09/23 Referring Provider (PT): Marchia Bond, MD   Encounter Date: 08/11/2019  PT End of Session - 08/11/19 1308    Visit Number  4    Number of Visits  18    Date for PT Re-Evaluation  09/08/19    Authorization Type  Generic [employer]: 30 visit limit    Authorization Time Period  07/28/19 to 09/08/19    Authorization - Visit Number  4    Authorization - Number of Visits  30    Progress Note Due on Visit  10    PT Start Time  1302    PT Stop Time  1345    PT Time Calculation (min)  43 min    Equipment Utilized During Treatment  Other (comment);Gait belt   RW   Activity Tolerance  Patient limited by pain;Patient tolerated treatment well;Patient limited by fatigue    Behavior During Therapy  [facility] for tasks assessed/performed;Restless       Past Medical History:  Diagnosis Date  . Arthritis   . Back pain    chronic  . Bronchitis    history of  . CAD (coronary artery disease)   . Chronic back pain   . Chronic insomnia    Per medical history form dated 06/13/10.  . Chronic left hip pain   . Colitis    Per medical history from dated 06/13/10.  . Diverticulitis    Hx of; requiring 3 admissions  . Elevated liver enzymes   . GERD (gastroesophageal reflux disease)   . Hypertension   . Hypothyroidism   . Left leg pain    chronic  . 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 Pneumonia 02/2019  . Psoriasis    Per medical history form dated 06/13/10.  . Sigmoid colon ulcer    Rectal polyps  . Sleep apnea with use of continuous positive airway pressure (CPAP)   . Urticaria     Past Surgical History:  Procedure Laterality Date  . APPENDECTOMY     8th grade  . BACK SURGERY    . CARDIAC CATHETERIZATION  2009  . CARDIAC  CATHETERIZATION     Per medical history from dated 06/13/10.  . CERVICAL SPINE SURGERY     C4, C5, C6 spinal fusion  . CHOLECYSTECTOMY N/A 08/31/2015   Procedure: LAPAROSCOPIC CHOLECYSTECTOMY WITH INTRAOPERATIVE CHOLANGIOGRAM;  Surgeon: Excell Seltzer, MD;  Location: [facility];  Service: General;  Laterality: N/A;  . COLONOSCOPY  07/2008   Colitis,NSAID v. Ischemia,malrotation of the gut,Diverticulosis(L),hyperplastic  . COLONOSCOPY N/A 12/13/2012   Dr. Oneida Alar: Normal TI, mild sigmoid diverticulosis, hemorrhoids, 2 polyps (tubular adenoma). Random colon bx negative. Next TCS 12/2022 with Fentanyl/phenergan  . ESOPHAGOGASTRODUODENOSCOPY N/A 04/16/2014   RMR: Erosive reflux esophagitis. Non critical Schzki's ring not manipulated. Small hiatal hernia. Abnormal gastirc mucosa of doubtful signigicance status post biopsy. I suspect trivial upper GI bleed. Recent abdominal pain presumably secondary to a protracted bout of diverticulitis. CT scan November 23 revealed improvemetn without complication. He finished his antibiotics 2 days age. Left sided ab  . JOINT REPLACEMENT    . LAPAROSCOPIC SIGMOID COLECTOMY  2012   Dr. Fanny Skates: recurrent sigmoid diverticulitis  . LAPAROSCOPIC SIGMOID COLECTOMY  2012   recurernt sigmoid diverticulitis, Dr. Dalbert Batman   . Cherre Robins  CUFF REPAIR     Left - per medical history form dated 06/13/10.  Marland Kitchen SHOULDER SURGERY     Left  . THYROIDECTOMY     Per medical history form dated 06/13/10.  Marland Kitchen TOTAL HIP ARTHROPLASTY  07/04/2011   Procedure: TOTAL HIP ARTHROPLASTY;  Surgeon: Johnny Bridge, MD;  Location: [place];  Service: Orthopedics;  Laterality: Right;  . TOTAL HIP ARTHROPLASTY Left 07/22/2019   Procedure: TOTAL HIP ARTHROPLASTY;  Surgeon: Marchia Bond, MD;  Location: [facility];  Service: Orthopedics;  Laterality: Left;    There were no vitals filed for this visit.  Subjective Assessment - 08/11/19 1307    Subjective  Patient says he had a couple rough days over the weekend.  Says he has still been having some pain, and had to go hospital last because of this. Says his pain meds have been refilled and he is feeling better today.    How long can you sit comfortably?  2-3 hrs    How long can you stand comfortably?  10-15 minutes    How long can you walk comfortably?  20 minutes with pain    Patient Stated Goals  get increased range of motion    Currently in Pain?  Yes    Pain Score  3     Pain Location  Hip    Pain Orientation  Left;Posterior;Lateral    Pain Descriptors / Indicators  Aching    Pain Type  Surgical pain    Pain Onset  1 to 4 weeks ago                       [facility] Adult PT Treatment/Exercise - 08/11/19 0001      Knee/Hip Exercises: Standing   Heel Raises  Both;20 reps    Knee Flexion  Both;20 reps    Hip Abduction  Left;2 sets;10 reps;Knee straight    Hip Extension  Left;2 sets;10 reps;Knee straight    Forward Step Up  Left;2 sets;10 reps;Hand Hold: 1;Step Height: 4"    Rocker Board  2 minutes    Rocker Board Limitations  lateral and DF/PF    Gait Training  100' and 226' with [facility]    Other Standing Knee Exercises  tandem stance on solid floor 2 x 30" each       Knee/Hip Exercises: Seated   Sit to Sand  5 reps;without UE support               PT Short Term Goals - 07/28/19 1511      PT SHORT TERM GOAL #1   Title  Pt will perform HEP correctly and consistently at least 3x/week, progressing as needed to improve strength and reduce pain with functional activites.    Time  3    Period  Weeks    Status  New    Target Date  08/18/19      PT SHORT TERM GOAL #2   Title  Pt will perform 5x STS in 45 sec, from elevated surface, with equal weight-bearing to demo improved functional strength with transfers.    Time  3    Period  Weeks    Status  New        PT Long Term Goals - 07/28/19 1513      PT LONG TERM GOAL #1   Title  Pt will demo L hip strength 4/5 and L knee strength 5/5 to improve gait mechanics and  tolerance to ambulation.  Time  6    Period  Weeks    Status  New    Target Date  09/08/19      PT LONG TERM GOAL #2   Title  Pt will perform 3MWT with LRAD at 1.86m/s gait speed to reduce risk for falls when ambulating in the community.    Time  6    Period  Weeks    Status  New      PT LONG TERM GOAL #3   Title  Pt will self report ability to ambulate for 30 minutes in order to complete grocery shopping or light activities around the home with 5/10 pain at worst to progress towards PLOF.    Time  6    Period  Weeks    Status  New      PT LONG TERM GOAL #4   Title  Pt will score 25% limited on FOTO to significantly improve pain and improve ability to perform functional activities around the home.    Time  6    Period  Weeks    Status  New            Plan - 08/11/19 1359    Clinical Impression Statement  Patient tolerated session fairly well today. Was able to progress to gait with SPC, tandem stance and sit to stands. Patient also able to progress standing hip strengthening exercise. Patient did note some increased discomfort in LT hip with standing hip extension and with LLE posterior during tandem/ staggered stance for static balance. Patient was educated on proper 2 point gait using SPC and was able to ambulate 100' and 226 without rest break. Patient continues to have swelling at operative site and noted redness approx. 5 inches in diameter about LT hip incision. Patient educated on proper wound management and to monitor for symptoms if hot to touch or redness worsens. Patient states this is improved since last week.    Personal Factors and Comorbidities  Comorbidity 3+;Time since onset of injury/illness/exacerbation    Comorbidities  arthritis, back pain, HTN, R THA 2013    Examination-Activity Limitations  Bathing;Bed Mobility;Bend;Carry;Dressing;Locomotion Level;Sleep;Stairs;Stand;Transfers    Examination-Participation Restrictions  Cleaning;Driving;Meal Prep;Yard  Work;Other    Stability/Clinical Decision Making  Stable/Uncomplicated    Rehab Potential  Good    PT Frequency  3x / week    PT Duration  6 weeks    PT Treatment/Interventions  ADLs/Self Care Home Management;Aquatic Therapy;Biofeedback;Cryotherapy;Moist Heat;DME Instruction;Gait training;Stair training;Functional mobility training;Therapeutic activities;Therapeutic exercise;Balance training;Neuromuscular re-education;Patient/family education;Orthotic Fit/Training;Manual techniques;Scar mobilization;Passive range of motion;Dry needling;Taping;Joint Manipulations    PT Next Visit Plan  Progress LLE strengthening, gait training, use manual as needed for pain relief. Monitor DVT and signs for infection.  Begin vectors and SLS next visit if tolerated.    PT Home Exercise Plan  Eval: continue acute HEP, weight-shifting with BUE support       Patient will benefit from skilled therapeutic intervention in order to improve the following deficits and impairments:  Abnormal gait, Decreased activity tolerance, Decreased balance, Decreased endurance, Decreased mobility, Decreased range of motion, Decreased strength, Difficulty walking, Hypomobility, Increased edema, Increased muscle spasms, Impaired perceived functional ability, Impaired flexibility, Impaired sensation, Pain  Visit Diagnosis: Muscle weakness (generalized)  Pain in left hip     Problem List Patient Active Problem List   Diagnosis Date Noted  . Osteoarthritis of left hip 07/22/2019  . S/P total hip arthroplasty 07/22/2019  . Food intolerance 07/09/2019  . Chronic urticaria 07/09/2019  . Seasonal  and perennial allergic rhinitis 07/09/2019  . Elevated liver enzymes   . Left sided abdominal pain 04/18/2019  . Diarrhea due to COVID-19 03/11/2019  . Vomiting 03/10/2019  . COVID-19 virus infection 03/10/2019  . Hypokalemia 03/10/2019  . AKI (acute kidney injury) ([place]) 03/10/2019  . Metabolic acidosis 99991111  . Lumbar  radiculopathy 01/26/2019  . LUQ pain 06/19/2016  . Symptomatic cholelithiasis 08/30/2015  . Preoperative cardiovascular examination   . Coronary artery disease involving native coronary artery of native heart without angina pectoris   . Gastroenteritis 08/26/2015  . Cholelithiases 08/24/2015  . Abdominal pain 08/24/2015  . Cholecystitis, acute 08/24/2015  . Diarrhea 08/24/2015  . Acute cholecystitis 08/24/2015  . Nausea without vomiting 04/08/2015  . Diverticulitis of colon   . Cerebral thrombosis with cerebral infarction ([place]) 03/21/2014  . Stroke ([place]) 03/21/2014  . Essential hypertension 03/21/2014  . TIA (transient ischemic attack)   . HLD (hyperlipidemia)   . Thyroid activity decreased   . Colitis 11/20/2012  . CAD (coronary artery disease)   . Hip pain 08/04/2011  . 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 LIVER FUNCTION TESTS, ABNORMAL, HX OF 12/17/2008   2:02 PM, 08/11/19 Josue Hector PT DPT  Physical Therapist with [facility]  [phone] [place] [address] [place], [place], [address] Phone: [phone]   Fax:  [phone]  Name: Andrew Love MRN: [id number] Date of Birth: [birth year]

## 2019-08-13 ENCOUNTER — Other Ambulatory Visit: Payer: Self-pay

## 2019-08-13 ENCOUNTER — Encounter (HOSPITAL_COMMUNITY): Payer: Self-pay

## 2019-08-13 ENCOUNTER — Ambulatory Visit (HOSPITAL_COMMUNITY): Payer: 59

## 2019-08-13 DIAGNOSIS — M6281 Muscle weakness (generalized): Secondary | ICD-10-CM

## 2019-08-13 DIAGNOSIS — R262 Difficulty in walking, not elsewhere classified: Secondary | ICD-10-CM

## 2019-08-13 DIAGNOSIS — R2689 Other abnormalities of gait and mobility: Secondary | ICD-10-CM

## 2019-08-13 DIAGNOSIS — M25552 Pain in left hip: Secondary | ICD-10-CM

## 2019-08-13 NOTE — Therapy (Signed)
[place], [place], [address] Phone: [phone]   Fax:  [phone]  Physical Therapy Treatment  Patient Details  Name: Andrew Love MRN: [id number] Date of Birth: 1965-08-20 Referring Provider (PT): Marchia Bond, MD   Encounter Date: 08/13/2019  PT End of Session - 08/13/19 1407    Visit Number  5    Number of Visits  18    Date for PT Re-Evaluation  09/08/19    Authorization Type  Generic [employer]: 30 visit limit    Authorization Time Period  07/28/19 to 09/08/19    Authorization - Visit Number  5    Authorization - Number of Visits  30    Progress Note Due on Visit  10    PT Start Time  [id number]    PT Stop Time  1432    PT Time Calculation (min)  39 min    Equipment Utilized During Treatment  Other (comment);Gait belt   SPC   Activity Tolerance  Patient limited by pain;Patient tolerated treatment well    Behavior During Therapy  [facility] for tasks assessed/performed;Restless       Past Medical History:  Diagnosis Date  . Arthritis   . Back pain    chronic  . Bronchitis    history of  . CAD (coronary artery disease)   . Chronic back pain   . Chronic insomnia    Per medical history form dated 06/13/10.  . Chronic left hip pain   . Colitis    Per medical history from dated 06/13/10.  . Diverticulitis    Hx of; requiring 3 admissions  . Elevated liver enzymes   . GERD (gastroesophageal reflux disease)   . Hypertension   . Hypothyroidism   . Left leg pain    chronic  . 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 Pneumonia 02/2019  . Psoriasis    Per medical history form dated 06/13/10.  . Sigmoid colon ulcer    Rectal polyps  . Sleep apnea with use of continuous positive airway pressure (CPAP)   . Urticaria     Past Surgical History:  Procedure Laterality Date  . APPENDECTOMY     8th grade  . BACK SURGERY    . CARDIAC CATHETERIZATION  2009  . CARDIAC CATHETERIZATION     Per  medical history from dated 06/13/10.  . CERVICAL SPINE SURGERY     C4, C5, C6 spinal fusion  . CHOLECYSTECTOMY N/A 08/31/2015   Procedure: LAPAROSCOPIC CHOLECYSTECTOMY WITH INTRAOPERATIVE CHOLANGIOGRAM;  Surgeon: Excell Seltzer, MD;  Location: [facility];  Service: General;  Laterality: N/A;  . COLONOSCOPY  07/2008   Colitis,NSAID v. Ischemia,malrotation of the gut,Diverticulosis(L),hyperplastic  . COLONOSCOPY N/A 12/13/2012   Dr. Oneida Alar: Normal TI, mild sigmoid diverticulosis, hemorrhoids, 2 polyps (tubular adenoma). Random colon bx negative. Next TCS 12/2022 with Fentanyl/phenergan  . ESOPHAGOGASTRODUODENOSCOPY N/A 04/16/2014   RMR: Erosive reflux esophagitis. Non critical Schzki's ring not manipulated. Small hiatal hernia. Abnormal gastirc mucosa of doubtful signigicance status post biopsy. I suspect trivial upper GI bleed. Recent abdominal pain presumably secondary to a protracted bout of diverticulitis. CT scan November 23 revealed improvemetn without complication. He finished his antibiotics 2 days age. Left sided ab  . JOINT REPLACEMENT    . LAPAROSCOPIC SIGMOID COLECTOMY  2012   Dr. Fanny Skates: recurrent sigmoid diverticulitis  . LAPAROSCOPIC SIGMOID COLECTOMY  2012   recurernt sigmoid diverticulitis, Dr. Dalbert Batman   . ROTATOR CUFF REPAIR  Left - per medical history form dated 06/13/10.  Marland Kitchen SHOULDER SURGERY     Left  . THYROIDECTOMY     Per medical history form dated 06/13/10.  Marland Kitchen TOTAL HIP ARTHROPLASTY  07/04/2011   Procedure: TOTAL HIP ARTHROPLASTY;  Surgeon: Johnny Bridge, MD;  Location: [place];  Service: Orthopedics;  Laterality: Right;  . TOTAL HIP ARTHROPLASTY Left 07/22/2019   Procedure: TOTAL HIP ARTHROPLASTY;  Surgeon: Marchia Bond, MD;  Location: [facility];  Service: Orthopedics;  Laterality: Left;    There were no vitals filed for this visit.  Subjective Assessment - 08/13/19 1401    Subjective  Pt reoprts increased pain Lt hip today.  Reoprts some redness around incision and  some heat.    Patient Stated Goals  get increased range of motion    Currently in Pain?  Yes    Pain Score  7     Pain Location  Hip    Pain Orientation  Left;Lateral   Lt lateral hip and medial knee   Pain Descriptors / Indicators  Tender;Dull;Aching;Sharp   sharp knee intermittent   Pain Type  Surgical pain    Pain Onset  1 to 4 weeks ago    Pain Frequency  Intermittent    Aggravating Factors   sitting low surface, movement    Pain Relieving Factors  pain medication, ice , walking    Effect of Pain on Daily Activities  limited                       [facility] Adult PT Treatment/Exercise - 08/13/19 0001      Ambulation/Gait   Ambulation/Gait  Yes    Ambulation/Gait Assistance  6: Modified independent (Device/Increase time)    Ambulation Distance (Feet)  540 Feet    Assistive device  Straight cane    Gait Pattern  Step-to pattern;Decreased step length - right;Decreased step length - left;Decreased stance time - left;Decreased hip/knee flexion - left;Decreased weight shift to left    Gait Comments  cueing for heel strike, Lt knee extension and equal stride length      Exercises   Exercises  Knee/Hip      Knee/Hip Exercises: Standing   Heel Raises  15 reps    Heel Raises Limitations  slant board    Forward Lunges  Both;10 reps;2 sets;5 seconds;Limitations    Forward Lunges Limitations  with 4" step    Functional Squat  10 reps    Functional Squat Limitations  front of chair, cueing for mechanics.    Rocker Board  2 minutes    Rocker Board Limitations  lateral and DF/PF    SLS  held due to high pain    Gait Training  588ft with SPC    Other Standing Knee Exercises  tandem stance on solid floor 2 x 30" each     Other Standing Knee Exercises  tandem stance on foam 2x 30"             PT Education - 08/13/19 1405    Education Details  Educated s/s of infection, encouraged to call MD due to increased pain, redness and heat proximal incision    Person(s)  Educated  Patient    Methods  Explanation    Comprehension  Verbalized understanding       PT Short Term Goals - 07/28/19 1511      PT SHORT TERM GOAL #1   Title  Pt will perform HEP correctly and  consistently at least 3x/week, progressing as needed to improve strength and reduce pain with functional activites.    Time  3    Period  Weeks    Status  New    Target Date  08/18/19      PT SHORT TERM GOAL #2   Title  Pt will perform 5x STS in 45 sec, from elevated surface, with equal weight-bearing to demo improved functional strength with transfers.    Time  3    Period  Weeks    Status  New        PT Long Term Goals - 07/28/19 1513      PT LONG TERM GOAL #1   Title  Pt will demo L hip strength 4/5 and L knee strength 5/5 to improve gait mechanics and tolerance to ambulation.    Time  6    Period  Weeks    Status  New    Target Date  09/08/19      PT LONG TERM GOAL #2   Title  Pt will perform 3MWT with LRAD at 1.24m/s gait speed to reduce risk for falls when ambulating in the community.    Time  6    Period  Weeks    Status  New      PT LONG TERM GOAL #3   Title  Pt will self report ability to ambulate for 30 minutes in order to complete grocery shopping or light activities around the home with 5/10 pain at worst to progress towards PLOF.    Time  6    Period  Weeks    Status  New      PT LONG TERM GOAL #4   Title  Pt will score 25% limited on FOTO to significantly improve pain and improve ability to perform functional activities around the home.    Time  6    Period  Weeks    Status  New            Plan - 08/13/19 1418    Clinical Impression Statement  Pt arrived with increased pain.  Noted increased redness, heat, pain and edema present 5in in diameter above Lt hip incision.  Encouraged pt to call MD.  Session focus on hip strengthening and gait training with SPC.  Pt able to demonstrate appropriate sequence with SPC, cueing to improve stance phase Lt and  increase stride length Rt.  Pt lacking knee extension Lt with heel strike, does appear with possible LLD.  DIscussed benefits of possible heel lift to improve gait mechanics, verbalized understanding.  This sessoin add squats for functional strengthening with cueing for mechanics.  Held SLS activities due to pain wiht BLE weight bearing.  EOS reports increased to 8/10.  Encouraged pt to call MD.    Personal Factors and Comorbidities  Comorbidity 3+;Time since onset of injury/illness/exacerbation    Comorbidities  arthritis, back pain, HTN, R THA 2013    Examination-Activity Limitations  Bathing;Bed Mobility;Bend;Carry;Dressing;Locomotion Level;Sleep;Stairs;Stand;Transfers    Examination-Participation Restrictions  Cleaning;Driving;Meal Prep;Yard Work;Other    Stability/Clinical Decision Making  Stable/Uncomplicated    Clinical Decision Making  Low    Rehab Potential  Good    PT Frequency  3x / week    PT Duration  6 weeks    PT Treatment/Interventions  ADLs/Self Care Home Management;Aquatic Therapy;Biofeedback;Cryotherapy;Moist Heat;DME Instruction;Gait training;Stair training;Functional mobility training;Therapeutic activities;Therapeutic exercise;Balance training;Neuromuscular re-education;Patient/family education;Orthotic Fit/Training;Manual techniques;Scar mobilization;Passive range of motion;Dry needling;Taping;Joint Manipulations    PT Next Visit Plan  Progress LLE strengthening, gait training, use manual as needed for pain relief. Monitor DVT and signs for infection.  Begin vectors and SLS next visit if tolerated.    PT Home Exercise Plan  Eval: continue acute HEP, weight-shifting with BUE support       Patient will benefit from skilled therapeutic intervention in order to improve the following deficits and impairments:  Abnormal gait, Decreased activity tolerance, Decreased balance, Decreased endurance, Decreased mobility, Decreased range of motion, Decreased strength, Difficulty walking,  Hypomobility, Increased edema, Increased muscle spasms, Impaired perceived functional ability, Impaired flexibility, Impaired sensation, Pain  Visit Diagnosis: Muscle weakness (generalized)  Pain in left hip  Difficulty in walking, not elsewhere classified  Other abnormalities of gait and mobility     Problem List Patient Active Problem List   Diagnosis Date Noted  . Osteoarthritis of left hip 07/22/2019  . S/P total hip arthroplasty 07/22/2019  . Food intolerance 07/09/2019  . Chronic urticaria 07/09/2019  . Seasonal and perennial allergic rhinitis 07/09/2019  . Elevated liver enzymes   . Left sided abdominal pain 04/18/2019  . Diarrhea due to COVID-19 03/11/2019  . Vomiting 03/10/2019  . COVID-19 virus infection 03/10/2019  . Hypokalemia 03/10/2019  . AKI (acute kidney injury) ([place]) 03/10/2019  . Metabolic acidosis 99991111  . Lumbar radiculopathy 01/26/2019  . LUQ pain 06/19/2016  . Symptomatic cholelithiasis 08/30/2015  . Preoperative cardiovascular examination   . Coronary artery disease involving native coronary artery of native heart without angina pectoris   . Gastroenteritis 08/26/2015  . Cholelithiases 08/24/2015  . Abdominal pain 08/24/2015  . Cholecystitis, acute 08/24/2015  . Diarrhea 08/24/2015  . Acute cholecystitis 08/24/2015  . Nausea without vomiting 04/08/2015  . Diverticulitis of colon   . Cerebral thrombosis with cerebral infarction ([place]) 03/21/2014  . Stroke ([place]) 03/21/2014  . Essential hypertension 03/21/2014  . TIA (transient ischemic attack)   . HLD (hyperlipidemia)   . Thyroid activity decreased   . Colitis 11/20/2012  . CAD (coronary artery disease)   . Hip pain 08/04/2011  . 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 LIVER FUNCTION TESTS, ABNORMAL, HX OF 12/17/2008   Ihor Austin, LPTA/CLT; CBIS [phone]  Aldona Lento 08/13/2019, 2:43 PM  [place] [address] [place], [place], [address] Phone: [phone]   Fax:  [phone]  Name: DAUNE TORSTENSON MRN: [id number] Date of Birth: [birth year]

## 2019-08-18 ENCOUNTER — Ambulatory Visit (HOSPITAL_COMMUNITY): Payer: 59 | Admitting: Physical Therapy

## 2019-08-18 ENCOUNTER — Telehealth (HOSPITAL_COMMUNITY): Payer: Self-pay

## 2019-08-18 NOTE — Telephone Encounter (Signed)
Pt requested to be on hold until April 26 after he sees his MD again. MD said to stop PT until he was re-checked on 4//26.

## 2019-08-20 ENCOUNTER — Ambulatory Visit (HOSPITAL_COMMUNITY): Payer: 59 | Admitting: Physical Therapy

## 2019-08-25 ENCOUNTER — Ambulatory Visit (HOSPITAL_COMMUNITY): Payer: 59 | Admitting: Physical Therapy

## 2019-08-27 ENCOUNTER — Encounter (HOSPITAL_COMMUNITY): Payer: 59

## 2019-09-01 ENCOUNTER — Ambulatory Visit (HOSPITAL_COMMUNITY): Payer: 59 | Admitting: Physical Therapy

## 2019-09-01 ENCOUNTER — Telehealth (HOSPITAL_COMMUNITY): Payer: Self-pay | Admitting: Physical Therapy

## 2019-09-01 NOTE — Telephone Encounter (Signed)
pt lmonvm to cx today's appt due to he just saw his doctor and he wants him out of therapy for two weeks.

## 2019-09-03 ENCOUNTER — Ambulatory Visit (HOSPITAL_COMMUNITY): Payer: 59 | Admitting: Physical Therapy

## 2019-09-08 ENCOUNTER — Ambulatory Visit (HOSPITAL_COMMUNITY): Payer: 59 | Admitting: Physical Therapy

## 2019-09-10 ENCOUNTER — Encounter (HOSPITAL_COMMUNITY): Payer: 59 | Admitting: Physical Therapy

## 2019-09-10 ENCOUNTER — Ambulatory Visit (HOSPITAL_COMMUNITY): Payer: 59 | Admitting: Physical Therapy

## 2019-09-10 ENCOUNTER — Telehealth: Payer: Self-pay | Admitting: *Deleted

## 2019-09-10 NOTE — Telephone Encounter (Signed)
Patient was submitted for Xolair 07/24/19 to [facility] after receiving dose in clinic 07/09/19.  [facility] has tried to reach patient multiple times and I have L/M for patient on 3/24 and 3/31 and patient never returned calls. I will assume patient is no longer interested in continuing Xolair therapy and if he decides in future he wants to restart he can reach out to me.

## 2019-09-15 ENCOUNTER — Encounter (HOSPITAL_COMMUNITY): Payer: Self-pay | Admitting: Physical Therapy

## 2019-09-15 NOTE — Therapy (Signed)
[place], [place], [address] Phone: [phone]   Fax:  [phone]  Patient Details  Name: Andrew Love MRN: [id number] Date of Birth: 13-Oct-1965 Referring Provider:  No ref. provider found  Encounter Date: 09/15/2019  PHYSICAL THERAPY DISCHARGE SUMMARY  Visits from Start of Care: 5  Current functional level related to goals / functional outcomes: Unable to perform formal reassess as patient did not return for follow up visits.    Remaining deficits: Unable to perform formal reassess as patient did not return for follow up visits.    Education / Equipment: Patients wife called to request DC from therapy for her husband at this time, per recent MD f/u. Will return to therapy as needed with new referral. Patient attended a total of 5 visits, last visit dated 08/13/19.  Plan: Patient agrees to discharge.  Patient goals were not met. Patient is being discharged due to the physician's request.  ?????         10:10 AM, 09/15/19 Josue Hector PT DPT  Physical Therapist with [facility]  [phone] [place], [place], [address] Phone: [phone]   Fax:  [phone]

## 2019-10-13 ENCOUNTER — Ambulatory Visit
Admission: EM | Admit: 2019-10-13 | Discharge: 2019-10-13 | Disposition: A | Discharge status: Home / Self Care | Payer: 59 | Attending: Emergency Medicine | Admitting: Emergency Medicine

## 2019-10-13 ENCOUNTER — Other Ambulatory Visit: Payer: Self-pay

## 2019-10-13 DIAGNOSIS — H9202 Otalgia, left ear: Secondary | ICD-10-CM

## 2019-10-13 DIAGNOSIS — H65192 Other acute nonsuppurative otitis media, left ear: Secondary | ICD-10-CM

## 2019-10-13 DIAGNOSIS — R519 Headache, unspecified: Secondary | ICD-10-CM

## 2019-10-13 MED ORDER — AMOXICILLIN-POT CLAVULANATE 875-125 MG PO TABS: 1.0000 | ORAL_TABLET | Freq: Two times a day (BID) | ORAL | 0 refills | Status: AC

## 2019-10-13 NOTE — ED Triage Notes (Signed)
Pt presents with c/o left ear pain and headaches for past week

## 2019-10-13 NOTE — ED Provider Notes (Signed)
Andrew Love   [id number] 10/13/19 Arrival Time: 7793  CC: LT EAR PAIN  SUBJECTIVE: History from: patient.  Andrew Love is a 54 y.o. male who presents with LT ear pain x 1 week.  Denies a precipitating event, or trauma.  Patient states the pain is intermittent and sharp in character.  Patient has tried OTC medications with minimal relief.  Symptoms are made worse with lying down.  Denies similar symptoms in the past.  Complains of associated headache.    Denies fever, chills, fatigue, sinus pain, rhinorrhea, ear discharge, sore throat, facial droop, slurred speech, weakness, SOB, wheezing, chest pain, nausea, changes in bowel or bladder habits.    ROS: As per HPI.  All other pertinent ROS negative.     Past Medical History:  Diagnosis Date  . Arthritis   . Back pain    chronic  . Bronchitis    history of  . CAD (coronary artery disease)   . Chronic back pain   . Chronic insomnia    Per medical history form dated 06/13/10.  . Chronic left hip pain   . Colitis    Per medical history from dated 06/13/10.  . Diverticulitis    Hx of; requiring 3 admissions  . Elevated liver enzymes   . GERD (gastroesophageal reflux disease)   . Hypertension   . Hypothyroidism   . Left leg pain    chronic  . 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 Pneumonia 02/2019  . Psoriasis    Per medical history form dated 06/13/10.  . Sigmoid colon ulcer    Rectal polyps  . Sleep apnea with use of continuous positive airway pressure (CPAP)   . Urticaria    Past Surgical History:  Procedure Laterality Date  . APPENDECTOMY     8th grade  . BACK SURGERY    . CARDIAC CATHETERIZATION  2009  . CARDIAC CATHETERIZATION     Per medical history from dated 06/13/10.  . CERVICAL SPINE SURGERY     C4, C5, C6 spinal fusion  . CHOLECYSTECTOMY N/A 08/31/2015   Procedure: LAPAROSCOPIC CHOLECYSTECTOMY WITH INTRAOPERATIVE CHOLANGIOGRAM;  Surgeon: Excell Seltzer, MD;  Location: [facility]  [facility];  Service: General;  Laterality: N/A;  . COLONOSCOPY  07/2008   Colitis,NSAID v. Ischemia,malrotation of the gut,Diverticulosis(L),hyperplastic  . COLONOSCOPY N/A 12/13/2012   Dr. Oneida Alar: Normal TI, mild sigmoid diverticulosis, hemorrhoids, 2 polyps (tubular adenoma). Random colon bx negative. Next TCS 12/2022 with Fentanyl/phenergan  . ESOPHAGOGASTRODUODENOSCOPY N/A 04/16/2014   RMR: Erosive reflux esophagitis. Non critical Schzki's ring not manipulated. Small hiatal hernia. Abnormal gastirc mucosa of doubtful signigicance status post biopsy. I suspect trivial upper GI bleed. Recent abdominal pain presumably secondary to a protracted bout of diverticulitis. CT scan November 23 revealed improvemetn without complication. He finished his antibiotics 2 days age. Left sided ab  . JOINT REPLACEMENT    . LAPAROSCOPIC SIGMOID COLECTOMY  2012   Dr. Fanny Skates: recurrent sigmoid diverticulitis  . LAPAROSCOPIC SIGMOID COLECTOMY  2012   recurernt sigmoid diverticulitis, Dr. Dalbert Batman   . ROTATOR CUFF REPAIR     Left - per medical history form dated 06/13/10.  Marland Kitchen SHOULDER SURGERY     Left  . THYROIDECTOMY     Per medical history form dated 06/13/10.  Marland Kitchen TOTAL HIP ARTHROPLASTY  07/04/2011   Procedure: TOTAL HIP ARTHROPLASTY;  Surgeon: Johnny Bridge, MD;  Location: [place];  Service: Orthopedics;  Laterality: Right;  . TOTAL HIP ARTHROPLASTY Left 07/22/2019  Procedure: TOTAL HIP ARTHROPLASTY;  Surgeon: Marchia Bond, MD;  Location: [facility];  Service: Orthopedics;  Laterality: Left;   Allergies  Allergen Reactions  . Dust Mite Mixed Allergen Ext [Mite (D. Farinae)] Anaphylaxis and Hives  . Other Hives and Shortness Of Breath    Cockroaches  . Iohexol Hives  . Almond (Diagnostic) Hives  . Oysters [Shellfish Allergy] Hives  . Wheat Bran Hives  . Yeast-Related Products Hives   Current Facility-Administered Medications on File Prior to Encounter  Medication Dose Route Frequency Provider Last Rate Last  Admin  . omalizumab Arvid Right) injection 300 mg  300 mg Subcutaneous Q28 days Valentina Shaggy, MD   300 mg at 07/09/19 1229   Current Outpatient Medications on File Prior to Encounter  Medication Sig Dispense Refill  . acetaminophen (TYLENOL) 500 MG tablet Take 500-1,000 mg by mouth every 6 (six) hours as needed for mild pain or fever.    . baclofen (LIORESAL) 10 MG tablet Take 1 tablet (10 mg total) by mouth 3 (three) times daily. As needed for muscle spasm 50 tablet 0  . dexlansoprazole (DEXILANT) 60 MG capsule Take 1 capsule (60 mg total) by mouth daily before breakfast. (Patient taking differently: Take 60 mg by mouth daily. ) 30 capsule 11  . dicyclomine (BENTYL) 10 MG capsule Take 1 capsule (10 mg total) by mouth 4 (four) times daily -  before meals and at bedtime. As needed for loose stool, abdominal cramping. Hold if constipation. (Patient taking differently: Take 10 mg by mouth 4 (four) times daily -  before meals and at bedtime. ) 90 capsule 3  . EPINEPHrine (EPIPEN) 0.3 mg/0.3 mL SOAJ Inject 0.3 mLs (0.3 mg total) into the muscle as needed. (Patient taking differently: Inject 0.3 mg into the muscle as needed for anaphylaxis. ) 1 Device 3  . fluticasone (FLONASE) 50 MCG/ACT nasal spray Place 2 sprays into both nostrils daily. (Patient taking differently: Place 2 sprays into both nostrils daily as needed for allergies. ) 16 g 0  . HYDROmorphone (DILAUDID) 2 MG tablet Take 1 tablet (2 mg total) by mouth every 4 (four) hours as needed for severe pain. (Patient not taking: Reported on 08/06/2019) 30 tablet 0  . levothyroxine (SYNTHROID, LEVOTHROID) 300 MCG tablet Take 300 mcg by mouth every morning.     . meclizine (ANTIVERT) 25 MG tablet Take 1 tablet (25 mg total) by mouth 3 (three) times daily as needed for dizziness. 30 tablet 0  . olmesartan (BENICAR) 20 MG tablet Take 20 mg by mouth every morning.     . ondansetron (ZOFRAN) 4 MG tablet Take 1 tablet (4 mg total) by mouth every 6 (six)  hours. (Patient taking differently: Take 4 mg by mouth every 6 (six) hours as needed for nausea or vomiting. ) 12 tablet 0  . Oxycodone HCl 10 MG TABS Take 10 mg by mouth every 4 (four) hours as needed (for pain).     . promethazine (PHENERGAN) 25 MG tablet Take 1 tablet (25 mg total) by mouth every 6 (six) hours as needed for nausea or vomiting. 10 tablet 0  . sennosides-docusate sodium (SENOKOT-S) 8.6-50 MG tablet Take 2 tablets by mouth daily. (Patient not taking: Reported on 08/06/2019) 30 tablet 1  . XOLAIR 150 MG injection Inject 150 mg into the skin every 30 (thirty) days.     Marland Kitchen zolpidem (AMBIEN) 10 MG tablet Take 10 mg by mouth at bedtime.      Social History   Socioeconomic History  .  Marital status: Married    Spouse name: Not on file  . Number of children: Not on file  . Years of education: Not on file  . Highest education level: Not on file  Occupational History  . 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 Smoking status: Former Smoker    Packs/day: 0.50    Years: 18.00    Pack years: 9.00    Types: Cigarettes    Quit date: 02/15/2019    Years since quitting: 0.6  . Smokeless tobacco: Never Used  . Tobacco comment:    Substance and Sexual Activity  . Alcohol use: No    Alcohol/week: 0.0 standard drinks  . Drug use: No  . Sexual activity: Not on file  Other Topics Concern  . 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   Married   Social Determinants of Radio broadcast assistant Strain:   . Difficulty of Paying Living Expenses:   Food Insecurity:   . Worried About Charity fundraiser in the Last Year:   . Arboriculturist in the Last Year:   Transportation Needs:   . Film/video editor (Medical):   Marland Kitchen Lack of Transportation (Non-Medical):   Physical Activity:   . Days of Exercise per Week:   . Minutes of Exercise per Session:   Stress:   . Feeling of Stress :   Social Connections:   . Frequency of  Communication with Friends and Family:   . Frequency of Social Gatherings with Friends and Family:   . Attends Religious Services:   . Active Member of Clubs or Organizations:   . Attends Archivist Meetings:   Marland Kitchen Marital Status:   Intimate Partner Violence:   . Fear of Current or Ex-Partner:   . Emotionally Abused:   Marland Kitchen Physically Abused:   . Sexually Abused:    Family History  Problem Relation Age of Onset  . Cancer Mother        breast cancer - per medical history form dated 06/13/10.  . Diverticulitis Mother   . Hypertension Mother   . Cancer Father        skin - per medical history form dated 06/13/10.  Marland Kitchen Hypertension Father   . Anesthesia problems Neg Hx   . Hypotension Neg Hx   . Malignant hyperthermia Neg Hx   . Pseudochol deficiency Neg Hx   . Colon cancer Neg Hx     OBJECTIVE:  Vitals:   10/13/19 1553  BP: (!) 152/90  Pulse: 83  Resp: 18  Temp: 98.1 F (36.7 C)  SpO2: 95%    General appearance: alert; well-appearing, nontoxic; speaking in full sentences and tolerating own secretions HEENT: NCAT; Ears: EACs clear, RT TM pearly gray, LT TM erythematous; Eyes: PERRL.  EOM grossly intact.Nose: nares patent without rhinorrhea, Throat: oropharynx clear, tonsils non erythematous or enlarged, uvula midline  Neck: supple without LAD Lungs: unlabored respirations, symmetrical air entry; cough: absent; no respiratory distress; CTAB Heart: regular rate and rhythm.  Neuro: ambulates without difficulty; no obvious facial droop, or slurred speech Skin: warm and dry Psychological: alert and cooperative; normal mood and affect   ASSESSMENT & PLAN:  1. Left ear pain   2. Other non-recurrent acute nonsuppurative otitis media of left ear   3. Acute nonintractable headache, unspecified headache type     Meds ordered this encounter  Medications  . amoxicillin-clavulanate (AUGMENTIN) 875-125 MG tablet  Sig: Take 1 tablet by mouth every 12 (twelve) hours for 10 days.     Dispense:  20 tablet    Refill:  0    Order Specific Question:   Supervising Provider    Answer:   Raylene Everts [[id number]]   Rest and drink plenty of fluids Prescribed augmentin Take medication as directed and to completion Continue to use OTC ibuprofen and/ or tylenol as needed for pain control Follow up with PCP if symptoms persists Return here or go to the ER if you have any new or worsening symptoms fever, chills, nausea, vomiting, worsening symptoms despite treatment, etc...  Reviewed expectations re: course of current medical issues. Questions answered. Outlined signs and symptoms indicating need for more acute intervention. Patient verbalized understanding. After Visit Summary given.         Lestine Box, PA-C 10/13/19 1626

## 2019-10-13 NOTE — Discharge Instructions (Signed)
Rest and drink plenty of fluids Prescribed augmentin Take medication as directed and to completion Continue to use OTC ibuprofen and/ or tylenol as needed for pain control Follow up with PCP if symptoms persists Return here or go to the ER if you have any new or worsening symptoms fever, chills, nausea, vomiting, worsening symptoms despite treatment, etc..Marland Kitchen

## 2020-01-04 ENCOUNTER — Other Ambulatory Visit: Payer: Self-pay

## 2020-01-04 ENCOUNTER — Encounter (HOSPITAL_COMMUNITY): Payer: Self-pay | Admitting: Emergency Medicine

## 2020-01-04 ENCOUNTER — Emergency Department (HOSPITAL_COMMUNITY)
Admission: EM | Admit: 2020-01-04 | Discharge: 2020-01-04 | Disposition: A | Discharge status: Home / Self Care | Payer: 59 | Attending: Emergency Medicine | Admitting: Emergency Medicine

## 2020-01-04 DIAGNOSIS — Y9389 Activity, other specified: Secondary | ICD-10-CM | POA: Insufficient documentation

## 2020-01-04 DIAGNOSIS — Y92007 Garden or yard of unspecified non-institutional (private) residence as the place of occurrence of the external cause: Secondary | ICD-10-CM | POA: Diagnosis not present

## 2020-01-04 DIAGNOSIS — S81802A Unspecified open wound, left lower leg, initial encounter: Secondary | ICD-10-CM | POA: Diagnosis not present

## 2020-01-04 DIAGNOSIS — Z79890 Hormone replacement therapy (postmenopausal): Secondary | ICD-10-CM | POA: Insufficient documentation

## 2020-01-04 DIAGNOSIS — F17210 Nicotine dependence, cigarettes, uncomplicated: Secondary | ICD-10-CM | POA: Diagnosis not present

## 2020-01-04 DIAGNOSIS — I2510 Atherosclerotic heart disease of native coronary artery without angina pectoris: Secondary | ICD-10-CM | POA: Insufficient documentation

## 2020-01-04 DIAGNOSIS — Z79899 Other long term (current) drug therapy: Secondary | ICD-10-CM | POA: Diagnosis not present

## 2020-01-04 DIAGNOSIS — W5581XA Bitten by other mammals, initial encounter: Secondary | ICD-10-CM | POA: Insufficient documentation

## 2020-01-04 DIAGNOSIS — I10 Essential (primary) hypertension: Secondary | ICD-10-CM | POA: Diagnosis not present

## 2020-01-04 DIAGNOSIS — S81832A Puncture wound without foreign body, left lower leg, initial encounter: Secondary | ICD-10-CM

## 2020-01-04 DIAGNOSIS — E039 Hypothyroidism, unspecified: Secondary | ICD-10-CM | POA: Diagnosis not present

## 2020-01-04 DIAGNOSIS — S8992XA Unspecified injury of left lower leg, initial encounter: Secondary | ICD-10-CM | POA: Diagnosis present

## 2020-01-04 DIAGNOSIS — Y999 Unspecified external cause status: Secondary | ICD-10-CM | POA: Insufficient documentation

## 2020-01-04 MED ORDER — ONDANSETRON 4 MG PO TBDP
4.0000 mg | ORAL_TABLET | Freq: Once | ORAL | Status: AC
  Administered 2020-01-04: 4 mg via ORAL
  Filled 2020-01-04: qty 1

## 2020-01-04 MED ORDER — ONDANSETRON HCL 4 MG PO TABS: 4.0000 mg | ORAL_TABLET | Freq: Four times a day (QID) | ORAL | 0 refills | Status: DC

## 2020-01-04 NOTE — ED Triage Notes (Signed)
Pt reports working in yard and may have been bitten by a snake   He has a 1/2 single ?flap puncture to his L lateral calf area   Reports it will not stop bleeding   bandaid removed and pressure dressing application   Reports injury at or about 1400 and filled his shoe with blood   When he showered, it began bleeding again

## 2020-01-04 NOTE — Discharge Instructions (Addendum)
Please keep the wound covered with a clean sterile dressing, you may clean with soap and water twice daily.  Please replace the gauze in 24 hours after soaking your leg to help remove it.  There will be a small amount of bleeding but this will gradually stop.  I would not be surprised if your leg had some bruising and swelling over the next 24 hours due to some bleeding under the skin but this will also gradually improve.  If you should develop fevers vomiting or any worsening symptoms please seek medical exam immediately  Tylenol or ibuprofen for pain, you may go back to work as needed

## 2020-01-04 NOTE — ED Provider Notes (Signed)
[facility] EMERGENCY DEPARTMENT Provider Note   CSN: [id number] Arrival date & time: 01/04/20  1828     History Chief Complaint  Patient presents with  . Leg Injury    DEVONTAYE GROUND is a 54 y.o. male.  HPI   This patient is a 54 year old male, he presents after having some type of puncture wound to his leg, is not sure what caused the puncture -but thinks it may have been a snake though he did not see it.  Since that time, over the last 7 hours this small anterior to anterolateral puncture wound has continued to have a small amount of bleeding.  He has noted a small amount of swelling of the leg though it is minimal, he is ambulatory, he is not febrile and not feeling ill whatsoever.  Past Medical History:  Diagnosis Date  . Arthritis   . Back pain    chronic  . Bronchitis    history of  . CAD (coronary artery disease)   . Chronic back pain   . Chronic insomnia    Per medical history form dated 06/13/10.  . Chronic left hip pain   . Colitis    Per medical history from dated 06/13/10.  . Diverticulitis    Hx of; requiring 3 admissions  . Elevated liver enzymes   . GERD (gastroesophageal reflux disease)   . Hypertension   . Hypothyroidism   . Left leg pain    chronic  . 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 Pneumonia 02/2019  . Psoriasis    Per medical history form dated 06/13/10.  . Sigmoid colon ulcer    Rectal polyps  . Sleep apnea with use of continuous positive airway pressure (CPAP)   . Urticaria     Patient Active Problem List   Diagnosis Date Noted  . Osteoarthritis of left hip 07/22/2019  . S/P total hip arthroplasty 07/22/2019  . Food intolerance 07/09/2019  . Chronic urticaria 07/09/2019  . Seasonal and perennial allergic rhinitis 07/09/2019  . Elevated liver enzymes   . Left sided abdominal pain 04/18/2019  . Diarrhea due to COVID-19 03/11/2019  . Vomiting 03/10/2019  . COVID-19 virus infection 03/10/2019  . Hypokalemia  03/10/2019  . AKI (acute kidney injury) ([place]) 03/10/2019  . Metabolic acidosis 60/73/7106  . Lumbar radiculopathy 01/26/2019  . LUQ pain 06/19/2016  . Symptomatic cholelithiasis 08/30/2015  . Preoperative cardiovascular examination   . Coronary artery disease involving native coronary artery of native heart without angina pectoris   . Gastroenteritis 08/26/2015  . Cholelithiases 08/24/2015  . Abdominal pain 08/24/2015  . Cholecystitis, acute 08/24/2015  . Diarrhea 08/24/2015  . Acute cholecystitis 08/24/2015  . Nausea without vomiting 04/08/2015  . Diverticulitis of colon   . Cerebral thrombosis with cerebral infarction ([place]) 03/21/2014  . Stroke ([place]) 03/21/2014  . Essential hypertension 03/21/2014  . TIA (transient ischemic attack)   . HLD (hyperlipidemia)   . Thyroid activity decreased   . Colitis 11/20/2012  . CAD (coronary artery disease)   . Hip pain 08/04/2011  . 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 LIVER FUNCTION TESTS, ABNORMAL, HX OF 12/17/2008    Past Surgical History:  Procedure Laterality Date  . APPENDECTOMY     8th grade  . BACK SURGERY    . CARDIAC CATHETERIZATION  2009  . CARDIAC CATHETERIZATION     Per medical history from dated 06/13/10.  . CERVICAL SPINE SURGERY     C4, C5, C6  spinal fusion  . CHOLECYSTECTOMY N/A 08/31/2015   Procedure: LAPAROSCOPIC CHOLECYSTECTOMY WITH INTRAOPERATIVE CHOLANGIOGRAM;  Surgeon: Excell Seltzer, MD;  Location: [facility];  Service: General;  Laterality: N/A;  . COLONOSCOPY  07/2008   Colitis,NSAID v. Ischemia,malrotation of the gut,Diverticulosis(L),hyperplastic  . COLONOSCOPY N/A 12/13/2012   Dr. Oneida Alar: Normal TI, mild sigmoid diverticulosis, hemorrhoids, 2 polyps (tubular adenoma). Random colon bx negative. Next TCS 12/2022 with Fentanyl/phenergan  . ESOPHAGOGASTRODUODENOSCOPY N/A 04/16/2014   RMR: Erosive reflux esophagitis. Non critical Schzki's ring not manipulated. Small hiatal hernia. Abnormal  gastirc mucosa of doubtful signigicance status post biopsy. I suspect trivial upper GI bleed. Recent abdominal pain presumably secondary to a protracted bout of diverticulitis. CT scan November 23 revealed improvemetn without complication. He finished his antibiotics 2 days age. Left sided ab  . JOINT REPLACEMENT    . LAPAROSCOPIC SIGMOID COLECTOMY  2012   Dr. Fanny Skates: recurrent sigmoid diverticulitis  . LAPAROSCOPIC SIGMOID COLECTOMY  2012   recurernt sigmoid diverticulitis, Dr. Dalbert Batman   . ROTATOR CUFF REPAIR     Left - per medical history form dated 06/13/10.  Marland Kitchen SHOULDER SURGERY     Left  . THYROIDECTOMY     Per medical history form dated 06/13/10.  Marland Kitchen TOTAL HIP ARTHROPLASTY  07/04/2011   Procedure: TOTAL HIP ARTHROPLASTY;  Surgeon: Johnny Bridge, MD;  Location: [place];  Service: Orthopedics;  Laterality: Right;  . TOTAL HIP ARTHROPLASTY Left 07/22/2019   Procedure: TOTAL HIP ARTHROPLASTY;  Surgeon: Marchia Bond, MD;  Location: [facility];  Service: Orthopedics;  Laterality: Left;       Family History  Problem Relation Age of Onset  . Cancer Mother        breast cancer - per medical history form dated 06/13/10.  . Diverticulitis Mother   . Hypertension Mother   . Cancer Father        skin - per medical history form dated 06/13/10.  Marland Kitchen Hypertension Father   . Anesthesia problems Neg Hx   . Hypotension Neg Hx   . Malignant hyperthermia Neg Hx   . Pseudochol deficiency Neg Hx   . Colon cancer Neg Hx     Social History   Tobacco Use  . Smoking status: Former Smoker    Packs/day: 0.50    Years: 18.00    Pack years: 9.00    Types: Cigarettes    Quit date: 02/15/2019    Years since quitting: 0.8  . Smokeless tobacco: Never Used  . Tobacco comment:    Vaping Use  . Vaping Use: Never used  Substance Use Topics  . Alcohol use: No    Alcohol/week: 0.0 standard drinks  . Drug use: No    Home Medications Prior to Admission medications   Medication Sig Start Date End Date  Taking? Authorizing Provider  acetaminophen (TYLENOL) 500 MG tablet Take 500-1,000 mg by mouth every 6 (six) hours as needed for mild pain or fever.    [provider]  baclofen (LIORESAL) 10 MG tablet Take 1 tablet (10 mg total) by mouth 3 (three) times daily. As needed for muscle spasm 07/23/19   Ventura Bruns, PA-C  dexlansoprazole (DEXILANT) 60 MG capsule Take 1 capsule (60 mg total) by mouth daily before breakfast. Patient taking differently: Take 60 mg by mouth daily.  04/18/19   Mahala Menghini, PA-C  dicyclomine (BENTYL) 10 MG capsule Take 1 capsule (10 mg total) by mouth 4 (four) times daily -  before meals and at bedtime. As  needed for loose stool, abdominal cramping. Hold if constipation. Patient taking differently: Take 10 mg by mouth 4 (four) times daily -  before meals and at bedtime.  04/18/19   Mahala Menghini, PA-C  EPINEPHrine (EPIPEN) 0.3 mg/0.3 mL SOAJ Inject 0.3 mLs (0.3 mg total) into the muscle as needed. Patient taking differently: Inject 0.3 mg into the muscle as needed for anaphylaxis.  12/01/12   Teressa Lower, MD  fluticasone (FLONASE) 50 MCG/ACT nasal spray Place 2 sprays into both nostrils daily. Patient taking differently: Place 2 sprays into both nostrils daily as needed for allergies.  02/26/19   Wurst, [place], PA-C  HYDROmorphone (DILAUDID) 2 MG tablet Take 1 tablet (2 mg total) by mouth every 4 (four) hours as needed for severe pain. Patient not taking: Reported on 08/06/2019 07/23/19   Ventura Bruns, PA-C  levothyroxine (SYNTHROID, LEVOTHROID) 300 MCG tablet Take 300 mcg by mouth every morning.  03/26/14   [provider]  meclizine (ANTIVERT) 25 MG tablet Take 1 tablet (25 mg total) by mouth 3 (three) times daily as needed for dizziness. 04/09/17   Isla Pence, MD  olmesartan (BENICAR) 20 MG tablet Take 20 mg by mouth every morning.     [provider]  ondansetron (ZOFRAN) 4 MG tablet Take 1 tablet (4 mg total) by mouth every 6  (six) hours. Patient taking differently: Take 4 mg by mouth every 6 (six) hours as needed for nausea or vomiting.  01/07/19   Wurst, [place], PA-C  Oxycodone HCl 10 MG TABS Take 10 mg by mouth every 4 (four) hours as needed (for pain).  07/28/19   [provider]  promethazine (PHENERGAN) 25 MG tablet Take 1 tablet (25 mg total) by mouth every 6 (six) hours as needed for nausea or vomiting. 04/10/19   Jacqlyn Larsen, PA-C  sennosides-docusate sodium (SENOKOT-S) 8.6-50 MG tablet Take 2 tablets by mouth daily. Patient not taking: Reported on 08/06/2019 07/23/19   Ventura Bruns, PA-C  XOLAIR 150 MG injection Inject 150 mg into the skin every 30 (thirty) days.  07/24/19   [provider]  zolpidem (AMBIEN) 10 MG tablet Take 10 mg by mouth at bedtime.     [provider]    Allergies    Dust mite mixed allergen ext [mite (d. farinae)], Other, Iohexol, Almond (diagnostic), Oysters [shellfish allergy], Wheat bran, and Yeast-related products  Review of Systems   Review of Systems  Constitutional: Negative for fever.  Skin: Positive for wound.  Hematological: Does not bruise/bleed easily.    Physical Exam Updated Vital Signs BP (!) 143/126 (BP Location: Left Arm)   Pulse 86   Temp 98.5 F (36.9 C) (Oral)   Ht 1.829 m (6')   Wt 106.6 kg   SpO2 100%   BMI 31.87 kg/m   Physical Exam Vitals and nursing note reviewed.  Constitutional:      Appearance: He is well-developed. He is not diaphoretic.  HENT:     Head: Normocephalic and atraumatic.  Eyes:     General:        Right eye: No discharge.        Left eye: No discharge.     Conjunctiva/sclera: Conjunctivae normal.  Pulmonary:     Effort: Pulmonary effort is normal. No respiratory distress.  Musculoskeletal:        General: Tenderness and signs of injury present.     Right lower leg: No edema.     Left lower leg: No edema.  Comments: Small 61mm puncture / flap to the anterior LLE - no active bleeding, the  LE has some slight swellign compare to the RLE but no bruisnig - supple joints, no sig ttp over the lower leg.  Skin:    General: Skin is warm and dry.     Findings: No erythema or rash.  Neurological:     Mental Status: He is alert.     Coordination: Coordination normal.     ED Results / Procedures / Treatments   Labs (all labs ordered are listed, but only abnormal results are displayed) Labs Reviewed - No data to display  EKG None  Radiology No results found.  Procedures Procedures (including critical care time)  Medications Ordered in ED Medications - No data to display  ED Course  I have reviewed the triage vital signs and the nursing notes.  Pertinent labs & imaging results that were available during my care of the patient were reviewed by me and considered in my medical decision making (see chart for details).    MDM Rules/Calculators/A&P                          This patient is well-appearing, has a small wound to the leg, the bleeding is now stopped, will clean with usual wound care, apply a sterile dressing with combat gauze, patient can follow-up, with 7 hours of time and only minimal swelling of the leg I doubt this is related to a venomous snake bite, no indication for antivenom, patient agreeable to follow-up as needed.  Final Clinical Impression(s) / ED Diagnoses Final diagnoses:  Puncture wound of left lower leg, initial encounter    Rx / DC Orders ED Discharge Orders    None       Noemi Chapel, MD 01/04/20 2116

## 2020-04-11 ENCOUNTER — Emergency Department (HOSPITAL_COMMUNITY)
Admission: EM | Admit: 2020-04-11 | Discharge: 2020-04-11 | Disposition: A | Discharge status: Home / Self Care | Payer: 59 | Attending: Emergency Medicine | Admitting: Emergency Medicine

## 2020-04-11 ENCOUNTER — Emergency Department (HOSPITAL_COMMUNITY): Payer: 59

## 2020-04-11 ENCOUNTER — Other Ambulatory Visit: Payer: Self-pay

## 2020-04-11 ENCOUNTER — Encounter (HOSPITAL_COMMUNITY): Payer: Self-pay | Admitting: Emergency Medicine

## 2020-04-11 DIAGNOSIS — I10 Essential (primary) hypertension: Secondary | ICD-10-CM | POA: Diagnosis not present

## 2020-04-11 DIAGNOSIS — Z96641 Presence of right artificial hip joint: Secondary | ICD-10-CM | POA: Diagnosis not present

## 2020-04-11 DIAGNOSIS — M5412 Radiculopathy, cervical region: Secondary | ICD-10-CM | POA: Diagnosis not present

## 2020-04-11 DIAGNOSIS — Z8616 Personal history of COVID-19: Secondary | ICD-10-CM | POA: Diagnosis not present

## 2020-04-11 DIAGNOSIS — M542 Cervicalgia: Secondary | ICD-10-CM | POA: Diagnosis present

## 2020-04-11 DIAGNOSIS — Z79899 Other long term (current) drug therapy: Secondary | ICD-10-CM | POA: Insufficient documentation

## 2020-04-11 DIAGNOSIS — R202 Paresthesia of skin: Secondary | ICD-10-CM | POA: Insufficient documentation

## 2020-04-11 DIAGNOSIS — I2510 Atherosclerotic heart disease of native coronary artery without angina pectoris: Secondary | ICD-10-CM | POA: Insufficient documentation

## 2020-04-11 DIAGNOSIS — Z87891 Personal history of nicotine dependence: Secondary | ICD-10-CM | POA: Diagnosis not present

## 2020-04-11 DIAGNOSIS — Z959 Presence of cardiac and vascular implant and graft, unspecified: Secondary | ICD-10-CM | POA: Insufficient documentation

## 2020-04-11 MED ORDER — DEXAMETHASONE SODIUM PHOSPHATE 10 MG/ML IJ SOLN
10.0000 mg | Freq: Once | INTRAMUSCULAR | Status: AC
  Administered 2020-04-11: 10 mg via INTRAMUSCULAR
  Filled 2020-04-11: qty 1

## 2020-04-11 MED ORDER — ACETAMINOPHEN 500 MG PO TABS
1000.0000 mg | ORAL_TABLET | Freq: Once | ORAL | Status: AC
  Administered 2020-04-11: 1000 mg via ORAL
  Filled 2020-04-11: qty 2

## 2020-04-11 MED ORDER — METHYLPREDNISOLONE 4 MG PO TBPK: ORAL_TABLET | ORAL | 0 refills | Status: DC

## 2020-04-11 MED ORDER — NAPROXEN 250 MG PO TABS
500.0000 mg | ORAL_TABLET | Freq: Once | ORAL | Status: AC
  Administered 2020-04-11: 500 mg via ORAL
  Filled 2020-04-11: qty 2

## 2020-04-11 MED ORDER — HYDROMORPHONE HCL 1 MG/ML IJ SOLN
1.0000 mg | Freq: Once | INTRAMUSCULAR | Status: AC
  Administered 2020-04-11: 1 mg via INTRAMUSCULAR
  Filled 2020-04-11: qty 1

## 2020-04-11 NOTE — ED Provider Notes (Signed)
[facility] EMERGENCY DEPARTMENT Provider Note   CSN: [id number] Arrival date & time: 04/11/20  1210     History Chief Complaint  Patient presents with  . Neck Pain    Andrew Love is a 54 y.o. male with pertinent past medical history of chronic back pain and neck pain after several surgeries (Dr. Saintclair Halsted) presents to the ER for evaluation of sudden onset, worsening, severe 10/10 neck pain.  This began 2 to 3 days ago.  The pain is located mostly in the right side of his neck and radiates to his shoulder, right pec, posterior arm, forearm and hand.  Worse with any movement of the neck specifically turning his head to the left, shoulder movement, or any movement of the upper right extremity.  Has had tingling and decreased sensation down the entire right hand but feels this is worse in the right fourth and fifth fingers.  Pain is worse with trying to make a fist.  Has been dropping some things with his right hand due to weakness secondary to pain.  Has been moving some furniture lately with his wife but does not remember specific injury.  No falls, neck injury.  Has had some "spasms" in his shoulder and right upper pec.  Had [employer] one night and thought that the pain in his shoulder was just gas trapped from the food but now feels like it is more related to his neck or possibly a bulging disc.  Has some degree of back pain every single day, chronic.  Has not felt like this has worsened particularly.  Reports urinating more frequently over the last few days, 3 times the urge to urinate came on very quick and he did make it to the bathroom.  No frank bladder or bowel control loss, retention.  No issues with bowel movements.  Has leftover naproxen, Tylenol, Excedrin and oxycodone at home.  Has also tried gabapentin and Robaxin but states his pain is still very severe.  Plans to call Dr. Saintclair Halsted this week but cannot tolerate the pain at home.  Denies exertional chest pain or shortness of breath.  No  headache, visual changes, difficulty with speech, swallowing.  Has pain with neck movement but no frank neck rigidity.  No fevers.  HPI     Past Medical History:  Diagnosis Date  . Arthritis   . Back pain    chronic  . Bronchitis    history of  . CAD (coronary artery disease)   . Chronic back pain   . Chronic insomnia    Per medical history form dated 06/13/10.  . Chronic left hip pain   . Colitis    Per medical history from dated 06/13/10.  . Diverticulitis    Hx of; requiring 3 admissions  . Elevated liver enzymes   . GERD (gastroesophageal reflux disease)   . Hypertension   . Hypothyroidism   . Left leg pain    chronic  . 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 Pneumonia 02/2019  . Psoriasis    Per medical history form dated 06/13/10.  . Sigmoid colon ulcer    Rectal polyps  . Sleep apnea with use of continuous positive airway pressure (CPAP)   . Urticaria     Patient Active Problem List   Diagnosis Date Noted  . Osteoarthritis of left hip 07/22/2019  . S/P total hip arthroplasty 07/22/2019  . Food intolerance 07/09/2019  . Chronic urticaria 07/09/2019  . Seasonal and perennial allergic rhinitis  07/09/2019  . Elevated liver enzymes   . Left sided abdominal pain 04/18/2019  . Diarrhea due to COVID-19 03/11/2019  . Vomiting 03/10/2019  . COVID-19 virus infection 03/10/2019  . Hypokalemia 03/10/2019  . AKI (acute kidney injury) ([place]) 03/10/2019  . Metabolic acidosis 69/67/8938  . Lumbar radiculopathy 01/26/2019  . LUQ pain 06/19/2016  . Symptomatic cholelithiasis 08/30/2015  . Preoperative cardiovascular examination   . Coronary artery disease involving native coronary artery of native heart without angina pectoris   . Gastroenteritis 08/26/2015  . Cholelithiases 08/24/2015  . Abdominal pain 08/24/2015  . Cholecystitis, acute 08/24/2015  . Diarrhea 08/24/2015  . Acute cholecystitis 08/24/2015  . Nausea without vomiting 04/08/2015  .  Diverticulitis of colon   . Cerebral thrombosis with cerebral infarction ([place]) 03/21/2014  . Stroke ([place]) 03/21/2014  . Essential hypertension 03/21/2014  . TIA (transient ischemic attack)   . HLD (hyperlipidemia)   . Thyroid activity decreased   . Colitis 11/20/2012  . CAD (coronary artery disease)   . Hip pain 08/04/2011  . 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 LIVER FUNCTION TESTS, ABNORMAL, HX OF 12/17/2008    Past Surgical History:  Procedure Laterality Date  . APPENDECTOMY     8th grade  . BACK SURGERY    . CARDIAC CATHETERIZATION  2009  . CARDIAC CATHETERIZATION     Per medical history from dated 06/13/10.  . CERVICAL SPINE SURGERY     C4, C5, C6 spinal fusion  . CHOLECYSTECTOMY N/A 08/31/2015   Procedure: LAPAROSCOPIC CHOLECYSTECTOMY WITH INTRAOPERATIVE CHOLANGIOGRAM;  Surgeon: Excell Seltzer, MD;  Location: [facility];  Service: General;  Laterality: N/A;  . COLONOSCOPY  07/2008   Colitis,NSAID v. Ischemia,malrotation of the gut,Diverticulosis(L),hyperplastic  . COLONOSCOPY N/A 12/13/2012   Dr. Oneida Alar: Normal TI, mild sigmoid diverticulosis, hemorrhoids, 2 polyps (tubular adenoma). Random colon bx negative. Next TCS 12/2022 with Fentanyl/phenergan  . ESOPHAGOGASTRODUODENOSCOPY N/A 04/16/2014   RMR: Erosive reflux esophagitis. Non critical Schzki's ring not manipulated. Small hiatal hernia. Abnormal gastirc mucosa of doubtful signigicance status post biopsy. I suspect trivial upper GI bleed. Recent abdominal pain presumably secondary to a protracted bout of diverticulitis. CT scan November 23 revealed improvemetn without complication. He finished his antibiotics 2 days age. Left sided ab  . JOINT REPLACEMENT    . LAPAROSCOPIC SIGMOID COLECTOMY  2012   Dr. Fanny Skates: recurrent sigmoid diverticulitis  . LAPAROSCOPIC SIGMOID COLECTOMY  2012   recurernt sigmoid diverticulitis, Dr. Dalbert Batman   . ROTATOR CUFF REPAIR     Left - per medical history form dated  06/13/10.  Marland Kitchen SHOULDER SURGERY     Left  . THYROIDECTOMY     Per medical history form dated 06/13/10.  Marland Kitchen TOTAL HIP ARTHROPLASTY  07/04/2011   Procedure: TOTAL HIP ARTHROPLASTY;  Surgeon: Johnny Bridge, MD;  Location: [place];  Service: Orthopedics;  Laterality: Right;  . TOTAL HIP ARTHROPLASTY Left 07/22/2019   Procedure: TOTAL HIP ARTHROPLASTY;  Surgeon: Marchia Bond, MD;  Location: [facility];  Service: Orthopedics;  Laterality: Left;       Family History  Problem Relation Age of Onset  . Cancer Mother        breast cancer - per medical history form dated 06/13/10.  . Diverticulitis Mother   . Hypertension Mother   . Cancer Father        skin - per medical history form dated 06/13/10.  Marland Kitchen Hypertension Father   . Anesthesia problems Neg Hx   . Hypotension Neg  Hx   . Malignant hyperthermia Neg Hx   . Pseudochol deficiency Neg Hx   . Colon cancer Neg Hx     Social History   Tobacco Use  . Smoking status: Former Smoker    Packs/day: 0.50    Years: 18.00    Pack years: 9.00    Types: Cigarettes    Quit date: 02/15/2019    Years since quitting: 1.1  . Smokeless tobacco: Never Used  . Tobacco comment:    Vaping Use  . Vaping Use: Never used  Substance Use Topics  . Alcohol use: No    Alcohol/week: 0.0 standard drinks  . Drug use: No    Home Medications Prior to Admission medications   Medication Sig Start Date End Date Taking? Authorizing Provider  acetaminophen (TYLENOL) 500 MG tablet Take 500-1,000 mg by mouth every 6 (six) hours as needed for mild pain or fever.    [provider]  baclofen (LIORESAL) 10 MG tablet Take 1 tablet (10 mg total) by mouth 3 (three) times daily. As needed for muscle spasm 07/23/19   Ventura Bruns, PA-C  dexlansoprazole (DEXILANT) 60 MG capsule Take 1 capsule (60 mg total) by mouth daily before breakfast. Patient taking differently: Take 60 mg by mouth daily.  04/18/19   Mahala Menghini, PA-C  dicyclomine (BENTYL) 10 MG capsule Take 1  capsule (10 mg total) by mouth 4 (four) times daily -  before meals and at bedtime. As needed for loose stool, abdominal cramping. Hold if constipation. Patient taking differently: Take 10 mg by mouth 4 (four) times daily -  before meals and at bedtime.  04/18/19   Mahala Menghini, PA-C  EPINEPHrine (EPIPEN) 0.3 mg/0.3 mL SOAJ Inject 0.3 mLs (0.3 mg total) into the muscle as needed. Patient taking differently: Inject 0.3 mg into the muscle as needed for anaphylaxis.  12/01/12   Teressa Lower, MD  fluticasone (FLONASE) 50 MCG/ACT nasal spray Place 2 sprays into both nostrils daily. Patient taking differently: Place 2 sprays into both nostrils daily as needed for allergies.  02/26/19   Wurst, [place], PA-C  HYDROmorphone (DILAUDID) 2 MG tablet Take 1 tablet (2 mg total) by mouth every 4 (four) hours as needed for severe pain. Patient not taking: Reported on 08/06/2019 07/23/19   Ventura Bruns, PA-C  levothyroxine (SYNTHROID, LEVOTHROID) 300 MCG tablet Take 300 mcg by mouth every morning.  03/26/14   [provider]  meclizine (ANTIVERT) 25 MG tablet Take 1 tablet (25 mg total) by mouth 3 (three) times daily as needed for dizziness. 04/09/17   Isla Pence, MD  methylPREDNISolone (MEDROL DOSEPAK) 4 MG TBPK tablet Take taper as prescribed 04/11/20   Kinnie Feil, PA-C  olmesartan (BENICAR) 20 MG tablet Take 20 mg by mouth every morning.     [provider]  ondansetron (ZOFRAN) 4 MG tablet Take 1 tablet (4 mg total) by mouth every 6 (six) hours. 01/04/20   Noemi Chapel, MD  Oxycodone HCl 10 MG TABS Take 10 mg by mouth every 4 (four) hours as needed (for pain).  07/28/19   [provider]  promethazine (PHENERGAN) 25 MG tablet Take 1 tablet (25 mg total) by mouth every 6 (six) hours as needed for nausea or vomiting. 04/10/19   Jacqlyn Larsen, PA-C  sennosides-docusate sodium (SENOKOT-S) 8.6-50 MG tablet Take 2 tablets by mouth daily. Patient not taking: Reported on 08/06/2019  07/23/19   Ventura Bruns, PA-C  XOLAIR 150 MG injection Inject  150 mg into the skin every 30 (thirty) days.  07/24/19   [provider]  zolpidem (AMBIEN) 10 MG tablet Take 10 mg by mouth at bedtime.     [provider]    Allergies    Dust mite mixed allergen ext [mite (d. farinae)], Other, Iohexol, Almond (diagnostic), Oysters [shellfish allergy], Wheat bran, and Yeast-related products  Review of Systems   Review of Systems  Musculoskeletal: Positive for neck pain.       Spasms  Neurological: Positive for numbness (tingling, loss of sensation).  All other systems reviewed and are negative.   Physical Exam Updated Vital Signs BP 122/64 (BP Location: Left Arm)   Pulse 70   Temp 98.1 F (36.7 C) (Oral)   Resp 16   Ht 6' (1.829 m)   Wt 107 kg   SpO2 96%   BMI 32.01 kg/m   Physical Exam Vitals and nursing note reviewed.  Constitutional:      General: He is not in acute distress.    Appearance: He is well-developed.     Comments: NAD.  Looks uncomfortable but nontoxic.  HENT:     Head: Normocephalic and atraumatic.     Right Ear: External ear normal.     Left Ear: External ear normal.     Nose: Nose normal.  Eyes:     General: No scleral icterus.    Conjunctiva/sclera: Conjunctivae normal.  Neck:     Comments: Trachea midline.  No cervical lymphadenopathy.  Mild pain with chin to chest but no frank rigidity. Cardiovascular:     Rate and Rhythm: Normal rate and regular rhythm.     Heart sounds: Normal heart sounds. No murmur heard.      Comments: 1+ radial pulses bilaterally.  No reproducible chest wall tenderness. Pulmonary:     Effort: Pulmonary effort is normal.     Breath sounds: Normal breath sounds. No wheezing.  Musculoskeletal:        General: No deformity. Normal range of motion.     Cervical back: Normal range of motion and neck supple. Tenderness and bony tenderness present. Pain with movement present.     Comments: Mild low  cervical/upper thoracic midline tenderness. Low right-sided cervical/thoracic paraspinal muscular tenderness.   Moderate pain reported with neck bending, rotation, worst to the left.  Moderate pain with any movement of right shoulder. No obvious step-offs.  No left-sided cervical or thoracic muscular tenderness.  No reproducible right pec/shoulder tenderness.  Patient did not tolerate Adson's test.  Spurling's deferred.  Skin:    General: Skin is warm and dry.     Capillary Refill: Capillary refill takes less than 2 seconds.     Comments: Skin normal around neck/RUE  Neurological:     Mental Status: He is alert and oriented to person, place, and time.     Comments: Decreased sensation to light touch in right 4th/5th fingers and in ulnar distribution of the forearm. Normal sensation in remaining RUE and in median, radial nerve distribution.  CN 2-12 intact. No weakness although patient reported pain with strength testing in RUE  Psychiatric:        Behavior: Behavior normal.        Thought Content: Thought content normal.        Judgment: Judgment normal.     ED Results / Procedures / Treatments   Labs (all labs ordered are listed, but only abnormal results are displayed) Labs Reviewed - No data to display  EKG None  Radiology CT Cervical Spine Wo Contrast  Result Date: 04/11/2020 CLINICAL DATA:  Neck pain radiating to right shoulder EXAM: CT CERVICAL SPINE WITHOUT CONTRAST TECHNIQUE: Multidetector CT imaging of the cervical spine was performed without intravenous contrast. Multiplanar CT image reconstructions were also generated. COMPARISON:  06/20/2019 FINDINGS: Alignment: Alignment is anatomic. Skull base and vertebrae: No acute fracture. No primary bone lesion or focal pathologic process. Soft tissues and spinal canal: No prevertebral fluid or swelling. No visible canal hematoma. Postsurgical changes from prior thyroidectomy. Disc levels: Previous ACDF spanning C4 through C6 with no  evidence of complication. There is prominent spondylosis at C6/C7 without significant central canal or neural foraminal encroachment. Upper chest: Central airway is patent. Lung apices are clear. Other: Reconstructed images demonstrate no additional findings. IMPRESSION: 1. Previous ACDF spanning C4 through C6 with no evidence of complication. 2. Prominent spondylosis at C6/C7 without significant central canal or neural foraminal encroachment. Electronically Signed   By: Randa Ngo M.D.   On: 04/11/2020 15:02   CT Thoracic Spine Wo Contrast  Result Date: 04/11/2020 CLINICAL DATA:  Right arm and shoulder pain. Injured moving furniture. EXAM: CT THORACIC SPINE WITHOUT CONTRAST TECHNIQUE: Multidetector CT images of the thoracic were obtained using the standard protocol without intravenous contrast. COMPARISON:  None. FINDINGS: Alignment: Normal. Vertebrae: No acute fracture or focal pathologic process. Paraspinal and other soft tissues: No acute paraspinal abnormality. Visualized lungs are clear. Disc levels: Degenerative disease with disc height loss at T10-11 and T11-12. Right facet arthropathy at T10-11 and T11-12. Mild right foraminal narrowing at T10-11. IMPRESSION: 1. No acute osseous injury of the thoracic spine. 2. Degenerative disease with disc height loss at T10-11 and T11-12. Right facet arthropathy at T10-11 and T11-12. Mild right foraminal narrowing at T10-11. Electronically Signed   By: Kathreen Devoid   On: 04/11/2020 14:46    Procedures Procedures (including critical care time)  Medications Ordered in ED Medications  HYDROmorphone (DILAUDID) injection 1 mg (1 mg Intramuscular Given 04/11/20 1537)  dexamethasone (DECADRON) injection 10 mg (10 mg Intramuscular Given 04/11/20 1530)  acetaminophen (TYLENOL) tablet 1,000 mg (1,000 mg Oral Given 04/11/20 1529)  naproxen (NAPROSYN) tablet 500 mg (500 mg Oral Given 04/11/20 1529)    ED Course  I have reviewed the triage vital signs and the  nursing notes.  Pertinent labs & imaging results that were available during my care of the patient were reviewed by me and considered in my medical decision making (see chart for details).  Clinical Course as of Apr 11 2348  Nancy Fetter Apr 11, 2020  1453  IMPRESSION: 1. No acute osseous injury of the thoracic spine. 2. Degenerative disease with disc height loss at T10-11 and T11-12. Right facet arthropathy at T10-11 and T11-12. Mild right foraminal narrowing at T10-11.  CT Thoracic Spine Wo Contrast [CG]  2348 IMPRESSION: 1. Previous ACDF spanning C4 through C6 with no evidence of complication. 2. Prominent spondylosis at C6/C7 without significant central canal or neural foraminal encroachment.  CT Cervical Spine Wo Contrast [CG]    Clinical Course User Index [CG] Kinnie Feil, PA-C   MDM Rules/Calculators/A&P                          54 year old male with chronic neck and back pain after several surgeries presents to the ED for mostly right-sided neck pain associated with pain in the right upper extremity, paresthesias.  Reports recent moving furniture but no trauma, falls.  Clinical presentation  most consistent with cervical radiculopathy, spasm, strain.  CT scans ordered given previous surgical intervention, new symptoms to evaluate for hardware malfunction or obvious acute findings.  CTs do not reveal any significant stenosis or narrowing.  Patient given Dilaudid IM, Decadron, Tylenol and naproxen here with improvement in pain.  Will discharge with high-dose NSAIDs, muscle relaxer, Medrol pack and short course of oxycodone.  Patient aware that refills on oxycodone will not be done here in the ED in the future.  Considered life-threatening process like dissection, stroke, meningitis, epidural abscess unlikely.  Advised patient to avoid aggravating activities until symptoms start to improve.  Educated patient on red flag symptoms that would warrant return to ED, patient verbalized  understanding.  Patient advised to f/u with Dr Saintclair Halsted for possibly PT and long term treatment of symptoms.  Final Clinical Impression(s) / ED Diagnoses Final diagnoses:  Cervical radiculopathy    Rx / DC Orders ED Discharge Orders         Ordered    methylPREDNISolone (MEDROL DOSEPAK) 4 MG TBPK tablet        04/11/20 1607           Arlean Hopping 04/11/20 2350    Milton Ferguson, MD 04/12/20 [phone]

## 2020-04-11 NOTE — ED Triage Notes (Signed)
Pt c/o neck pain x3 days after moving furniture.

## 2020-04-11 NOTE — Discharge Instructions (Addendum)
Given your symptoms and physical exam findings, I suspect you have cervical radiculopathy or muscle spasms or both  See below for CT results  IMPRESSION:  1. Previous ACDF spanning C4 through C6 with no evidence of  complication.  2. Prominent spondylosis at C6/C7 without significant central canal  or neural foraminal encroachment.   IMPRESSION:  1. No acute osseous injury of the thoracic spine.  2. Degenerative disease with disc height loss at T10-11 and T11-12.  Right facet arthropathy at T10-11 and T11-12. Mild right foraminal  narrowing at T10-11.   We will treat your inflammation with the following medication regimen: Medrol pack Robaxin 500 mg every 8 hours x 5 days Ibuprofen 600 mg + Tylenol 1000 mg every 8 hours for the next 5 days Heating pad as needed Over the counter lidocaine patches (salonpas) can be helpful  Avoid any exacerbating activities for the next 48 hours.  After 48 hours, start doing light back range of motion exercises and walking to avoid worsening back stiffness.   Return for fevers, chills, abdominal pain, changes in bowel movement, urinary symptoms, groin numbness, loss of bladder or bowel control, numbness weakness or heaviness to your extremities, rash.

## 2020-04-20 ENCOUNTER — Other Ambulatory Visit: Payer: Self-pay | Admitting: Neurosurgery

## 2020-04-20 DIAGNOSIS — M542 Cervicalgia: Secondary | ICD-10-CM

## 2020-04-22 ENCOUNTER — Ambulatory Visit
Admission: RE | Admit: 2020-04-22 | Discharge: 2020-04-22 | Disposition: A | Discharge status: Home / Self Care | Payer: 59 | Source: Ambulatory Visit | Attending: Neurosurgery | Admitting: Neurosurgery

## 2020-04-22 DIAGNOSIS — M542 Cervicalgia: Secondary | ICD-10-CM

## 2020-05-16 ENCOUNTER — Other Ambulatory Visit: Payer: 59

## 2020-12-07 ENCOUNTER — Encounter (HOSPITAL_COMMUNITY): Payer: Self-pay | Admitting: Emergency Medicine

## 2020-12-07 ENCOUNTER — Other Ambulatory Visit: Payer: Self-pay

## 2020-12-07 ENCOUNTER — Emergency Department (HOSPITAL_COMMUNITY): Payer: No Typology Code available for payment source

## 2020-12-07 ENCOUNTER — Emergency Department (HOSPITAL_COMMUNITY)
Admission: EM | Admit: 2020-12-07 | Discharge: 2020-12-07 | Disposition: A | Discharge status: Home / Self Care | Payer: No Typology Code available for payment source | Attending: Emergency Medicine | Admitting: Emergency Medicine

## 2020-12-07 DIAGNOSIS — Z79899 Other long term (current) drug therapy: Secondary | ICD-10-CM | POA: Insufficient documentation

## 2020-12-07 DIAGNOSIS — X088XXA Exposure to other specified smoke, fire and flames, initial encounter: Secondary | ICD-10-CM | POA: Diagnosis not present

## 2020-12-07 DIAGNOSIS — E039 Hypothyroidism, unspecified: Secondary | ICD-10-CM | POA: Diagnosis not present

## 2020-12-07 DIAGNOSIS — R062 Wheezing: Secondary | ICD-10-CM | POA: Diagnosis not present

## 2020-12-07 DIAGNOSIS — Z96642 Presence of left artificial hip joint: Secondary | ICD-10-CM | POA: Insufficient documentation

## 2020-12-07 DIAGNOSIS — Z8616 Personal history of COVID-19: Secondary | ICD-10-CM | POA: Diagnosis not present

## 2020-12-07 DIAGNOSIS — Z87891 Personal history of nicotine dependence: Secondary | ICD-10-CM | POA: Diagnosis not present

## 2020-12-07 DIAGNOSIS — I10 Essential (primary) hypertension: Secondary | ICD-10-CM | POA: Diagnosis not present

## 2020-12-07 DIAGNOSIS — R0602 Shortness of breath: Secondary | ICD-10-CM | POA: Insufficient documentation

## 2020-12-07 DIAGNOSIS — I2510 Atherosclerotic heart disease of native coronary artery without angina pectoris: Secondary | ICD-10-CM | POA: Diagnosis not present

## 2020-12-07 MED ORDER — DEXAMETHASONE SODIUM PHOSPHATE 10 MG/ML IJ SOLN
10.0000 mg | Freq: Once | INTRAMUSCULAR | Status: AC
  Administered 2020-12-07: 10 mg via INTRAMUSCULAR
  Filled 2020-12-07: qty 1

## 2020-12-07 MED ORDER — ALBUTEROL SULFATE HFA 108 (90 BASE) MCG/ACT IN AERS
2.0000 | INHALATION_SPRAY | Freq: Once | RESPIRATORY_TRACT | Status: AC
  Administered 2020-12-07: 2 via RESPIRATORY_TRACT
  Filled 2020-12-07: qty 6.7

## 2020-12-07 NOTE — Discharge Instructions (Addendum)
You are seen today for shortness of breath.  This is likely related to bronchospasm given your history of smoking.  The smoke exposure on Friday probably triggered this.  You were given steroids.  Use your inhaler every 4 hours.  Follow-up with your primary physician.

## 2020-12-07 NOTE — ED Notes (Signed)
Pt ambulated around unit with pulse oximetry monitoring. Pt 02 sats sustained above 94% while ambulating, HR 96-101 sustaining around 99. No distress noted with ambulation

## 2020-12-07 NOTE — ED Notes (Signed)
Patient transported to X-ray 
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
## 2021-03-23 ENCOUNTER — Emergency Department (HOSPITAL_COMMUNITY): Payer: No Typology Code available for payment source

## 2021-03-23 ENCOUNTER — Other Ambulatory Visit: Payer: Self-pay

## 2021-03-23 ENCOUNTER — Encounter (HOSPITAL_COMMUNITY): Payer: Self-pay

## 2021-03-23 ENCOUNTER — Emergency Department (HOSPITAL_COMMUNITY)
Admission: EM | Admit: 2021-03-23 | Discharge: 2021-03-24 | Disposition: A | Discharge status: Home / Self Care | Payer: No Typology Code available for payment source | Attending: Emergency Medicine | Admitting: Emergency Medicine

## 2021-03-23 DIAGNOSIS — M25521 Pain in right elbow: Secondary | ICD-10-CM | POA: Insufficient documentation

## 2021-03-23 DIAGNOSIS — Z87891 Personal history of nicotine dependence: Secondary | ICD-10-CM | POA: Diagnosis not present

## 2021-03-23 DIAGNOSIS — E039 Hypothyroidism, unspecified: Secondary | ICD-10-CM | POA: Insufficient documentation

## 2021-03-23 DIAGNOSIS — Z8616 Personal history of COVID-19: Secondary | ICD-10-CM | POA: Diagnosis not present

## 2021-03-23 DIAGNOSIS — I119 Hypertensive heart disease without heart failure: Secondary | ICD-10-CM | POA: Diagnosis not present

## 2021-03-23 DIAGNOSIS — X58XXXA Exposure to other specified factors, initial encounter: Secondary | ICD-10-CM | POA: Diagnosis not present

## 2021-03-23 DIAGNOSIS — S43401A Unspecified sprain of right shoulder joint, initial encounter: Secondary | ICD-10-CM | POA: Insufficient documentation

## 2021-03-23 DIAGNOSIS — I2510 Atherosclerotic heart disease of native coronary artery without angina pectoris: Secondary | ICD-10-CM | POA: Diagnosis not present

## 2021-03-23 DIAGNOSIS — Z79899 Other long term (current) drug therapy: Secondary | ICD-10-CM | POA: Insufficient documentation

## 2021-03-23 DIAGNOSIS — S4991XA Unspecified injury of right shoulder and upper arm, initial encounter: Secondary | ICD-10-CM | POA: Diagnosis present

## 2021-03-23 DIAGNOSIS — Z96642 Presence of left artificial hip joint: Secondary | ICD-10-CM | POA: Insufficient documentation

## 2021-03-23 MED ORDER — HYDROCODONE-ACETAMINOPHEN 5-325 MG PO TABS
1.0000 | ORAL_TABLET | Freq: Once | ORAL | Status: AC
  Administered 2021-03-24: 1 via ORAL
  Filled 2021-03-23: qty 1

## 2021-03-23 MED ORDER — ONDANSETRON 4 MG PO TBDP
4.0000 mg | ORAL_TABLET | Freq: Once | ORAL | Status: AC
  Administered 2021-03-23: 4 mg via ORAL
  Filled 2021-03-23: qty 1

## 2021-03-23 MED ORDER — IBUPROFEN 400 MG PO TABS
400.0000 mg | ORAL_TABLET | Freq: Once | ORAL | Status: AC
  Administered 2021-03-24: 400 mg via ORAL
  Filled 2021-03-23: qty 1

## 2021-03-23 NOTE — ED Triage Notes (Signed)
Pt was moving a dresser when he hurt his right shoulder and has a knot at his elbow. said he is unable to raise it. Called the [place] and they told him they could not see him tomorrow to come to the ER.

## 2021-03-24 NOTE — ED Provider Notes (Signed)
[facility] EMERGENCY DEPARTMENT Provider Note   CSN: [id number] Arrival date & time: 03/23/21  2147     History Chief Complaint  Patient presents with   Shoulder Injury    Right    Andrew Love is a 55 y.o. male.  The history is provided by the patient.  Shoulder Injury This is a new problem. The current episode started 3 to 5 hours ago. The problem occurs constantly. The problem has been gradually worsening. Pertinent negatives include no chest pain and no shortness of breath. Exacerbated by: movement. The symptoms are relieved by rest.  Patient presents with acute onset of right shoulder and elbow pain.  He reports he was moving a Ec[employer] when he heard a pop and had immediate pain in his shoulder.  He also reports pain in the elbow.  He reports numbness going down the lateral aspect of his arm.  He reports mild neck pain.  No other injuries or pain is reported    Past Medical History:  Diagnosis Date   Arthritis    Back pain    chronic   Bronchitis    history of   CAD (coronary artery disease)    Chronic back pain    Chronic insomnia    Per medical history form dated 06/13/10.   Chronic left hip pain    Colitis    Per medical history from dated 06/13/10.   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   Sigmoid colon ulcer    Rectal polyps   Sleep apnea with use of continuous positive airway pressure (CPAP)    Urticaria     Patient Active Problem List   Diagnosis Date Noted   Osteoarthritis of left hip 07/22/2019   S/P total hip arthroplasty 07/22/2019   Food intolerance 07/09/2019   Chronic urticaria 07/09/2019   Seasonal and perennial allergic rhinitis 07/09/2019   Elevated liver enzymes     Left sided abdominal pain 04/18/2019   Diarrhea due to COVID-19 03/11/2019   Vomiting 03/10/2019   COVID-19 virus infection 03/10/2019   Hypokalemia 03/10/2019   AKI (acute kidney injury) ([place]) 66/29/4765   Metabolic acidosis 46/50/3546   Lumbar radiculopathy 01/26/2019   LUQ pain 06/19/2016   Symptomatic cholelithiasis 08/30/2015   Preoperative cardiovascular examination    Coronary artery disease involving native coronary artery of native heart without angina pectoris    Gastroenteritis 08/26/2015   Cholelithiases 08/24/2015   Abdominal pain 08/24/2015   Cholecystitis, acute 08/24/2015   Diarrhea 08/24/2015   Acute cholecystitis 08/24/2015   Nausea without vomiting 04/08/2015   Diverticulitis of colon    Cerebral thrombosis with cerebral infarction ([place]) 03/21/2014   Stroke ([place]) 03/21/2014   Essential hypertension 03/21/2014   TIA (transient ischemic attack)    HLD (hyperlipidemia)    Thyroid activity decreased    Colitis 11/20/2012   CAD (coronary artery disease)    Hip pain 08/04/2011   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   CERVICAL SPINE SURGERY     C4, C5, C6 spinal fusion   CHOLECYSTECTOMY N/A 08/31/2015   Procedure: LAPAROSCOPIC CHOLECYSTECTOMY WITH INTRAOPERATIVE CHOLANGIOGRAM;  Surgeon: Excell Seltzer, MD;  Location: [facility];  Service: General;  Laterality: N/A;   COLONOSCOPY  07/2008   Colitis,NSAID v. Ischemia,malrotation of the gut,Diverticulosis(L),hyperplastic   COLONOSCOPY N/A 12/13/2012   Dr. Oneida Alar: Normal TI, mild sigmoid diverticulosis, hemorrhoids, 2 polyps (tubular adenoma). Random colon bx negative. Next TCS 12/2022 with Fentanyl/phenergan   ESOPHAGOGASTRODUODENOSCOPY N/A  04/16/2014   RMR: Erosive reflux esophagitis. Non critical Schzki's ring not manipulated. Small hiatal hernia. Abnormal gastirc mucosa of doubtful signigicance status post biopsy. I suspect trivial upper GI bleed. Recent abdominal pain presumably secondary to a protracted bout of diverticulitis. CT scan November 23 revealed improvemetn without complication. He finished his antibiotics 2 days age. Left sided ab   JOINT REPLACEMENT     LAPAROSCOPIC SIGMOID COLECTOMY  2012   Dr. Fanny Skates: recurrent sigmoid diverticulitis   LAPAROSCOPIC SIGMOID COLECTOMY  2012   recurernt sigmoid diverticulitis, Dr. Irish Elders CUFF REPAIR     Left - per medical history form dated 06/13/10.   SHOULDER SURGERY     Left   THYROIDECTOMY     Per medical history form dated 06/13/10.   TOTAL HIP ARTHROPLASTY  07/04/2011   Procedure: TOTAL HIP ARTHROPLASTY;  Surgeon: Johnny Bridge, MD;  Location: [place];  Service: Orthopedics;  Laterality: Right;   TOTAL HIP ARTHROPLASTY Left 07/22/2019   Procedure: TOTAL HIP ARTHROPLASTY;  Surgeon: Marchia Bond, MD;  Location: [facility];  Service: Orthopedics;  Laterality: Left;       Family History  Problem Relation Age of Onset   Cancer Mother        breast cancer - per medical history form dated 06/13/10.   Diverticulitis Mother    Hypertension Mother    Cancer Father        skin - per medical history form dated 06/13/10.   Hypertension Father    Anesthesia problems Neg Hx    Hypotension Neg Hx    Malignant hyperthermia Neg Hx    Pseudochol deficiency Neg Hx    Colon cancer Neg Hx     Social History   Tobacco Use   Smoking status: Former    Packs/day: 0.50    Years: 18.00    Pack years: 9.00    Types: Cigarettes    Quit date: 02/15/2019    Years since quitting: 2.1   Smokeless tobacco: Never   Tobacco comments:       Vaping Use   Vaping Use: Never used  Substance Use Topics   Alcohol use: No    Alcohol/week: 0.0 standard drinks   Drug use: No     Home Medications Prior to Admission medications   Medication Sig Start Date End Date Taking? Authorizing Provider  acetaminophen (TYLENOL) 500 MG tablet Take 500-1,000 mg by mouth every 6 (six) hours as needed for mild pain or fever.    [provider]  baclofen (LIORESAL) 10 MG tablet Take 1 tablet (10 mg total) by mouth 3 (three) times daily. As needed for muscle spasm 07/23/19   Ventura Bruns, PA-C  dexlansoprazole (DEXILANT) 60 MG capsule Take 1 capsule (60 mg total) by mouth daily before breakfast. Patient taking differently: Take 60 mg by mouth daily.  04/18/19  Mahala Menghini, PA-C  dicyclomine (BENTYL) 10 MG capsule Take 1 capsule (10 mg total) by mouth 4 (four) times daily -  before meals and at bedtime. As needed for loose stool, abdominal cramping. Hold if constipation. Patient taking differently: Take 10 mg by mouth 4 (four) times daily -  before meals and at bedtime.  04/18/19   Mahala Menghini, PA-C  EPINEPHrine (EPIPEN) 0.3 mg/0.3 mL SOAJ Inject 0.3 mLs (0.3 mg total) into the muscle as needed. Patient taking differently: Inject 0.3 mg into the muscle as needed for anaphylaxis.  12/01/12   Teressa Lower, MD  fluticasone (FLONASE) 50 MCG/ACT nasal spray Place 2 sprays into both nostrils daily. Patient taking differently: Place 2 sprays into both nostrils daily as needed for allergies.  02/26/19   Wurst, [place], PA-C  HYDROmorphone (DILAUDID) 2 MG tablet Take 1 tablet (2 mg total) by mouth every 4 (four) hours as needed for severe pain. Patient not taking: Reported on 08/06/2019 07/23/19   Ventura Bruns, PA-C  levothyroxine (SYNTHROID, LEVOTHROID) 300 MCG tablet Take 300 mcg by mouth every morning.  03/26/14   [provider]  meclizine (ANTIVERT) 25 MG tablet Take 1 tablet (25 mg total) by mouth 3 (three) times daily as needed for dizziness. 04/09/17   Isla Pence, MD  methylPREDNISolone (MEDROL DOSEPAK) 4 MG TBPK tablet Take taper as prescribed 04/11/20    Kinnie Feil, PA-C  olmesartan (BENICAR) 20 MG tablet Take 20 mg by mouth every morning.     [provider]  ondansetron (ZOFRAN) 4 MG tablet Take 1 tablet (4 mg total) by mouth every 6 (six) hours. 01/04/20   Noemi Chapel, MD  Oxycodone HCl 10 MG TABS Take 10 mg by mouth every 4 (four) hours as needed (for pain).  07/28/19   [provider]  promethazine (PHENERGAN) 25 MG tablet Take 1 tablet (25 mg total) by mouth every 6 (six) hours as needed for nausea or vomiting. 04/10/19   Jacqlyn Larsen, PA-C  sennosides-docusate sodium (SENOKOT-S) 8.6-50 MG tablet Take 2 tablets by mouth daily. Patient not taking: Reported on 08/06/2019 07/23/19   Ventura Bruns, PA-C  XOLAIR 150 MG injection Inject 150 mg into the skin every 30 (thirty) days.  07/24/19   [provider]  zolpidem (AMBIEN) 10 MG tablet Take 10 mg by mouth at bedtime.     [provider]    Allergies    Dust mite mixed allergen ext [mite (d. farinae)], Other, Iohexol, Almond (diagnostic), Oysters [shellfish allergy], Wheat bran, and Yeast-related products  Review of Systems   Review of Systems  Constitutional:  Negative for fever.  Respiratory:  Negative for shortness of breath.   Cardiovascular:  Negative for chest pain.  Musculoskeletal:  Positive for arthralgias, joint swelling and neck pain.  Neurological:  Positive for numbness. Negative for weakness.  All other systems reviewed and are negative.  Physical Exam Updated Vital Signs BP (!) 151/76   Pulse (!) 58   Temp 97.6 F (36.4 C) (Oral)   Resp 18   Ht 1.829 m (6')   Wt 106.1 kg   SpO2 95%   BMI 31.74 kg/m   Physical Exam CONSTITUTIONAL: Well developed/well nourished, uncomfortable. HEAD: Normocephalic/atraumatic EYES: EOMI/PERRL ENMT: Mucous membranes moist NECK: supple no meningeal signs SPINE/BACK:entire spine nontender, right cervical paraspinal tenderness CV: S1/S2 noted, no murmurs/rubs/gallops noted LUNGS: Lungs  are clear to auscultation bilaterally, no apparent distress ABDOMEN: soft NEURO: Pt is awake/alert/appropriate, moves all extremitiesx4.  No facial droop.  Patient is able to make fist with both hands.  He reports numbness along the lateral aspect of the right  arm and to the right  pinky finger He is able to flex and extend the right wrist. he is  able to flex and extend the right elbow.  Full abduction of right shoulder limited due to pain EXTREMITIES: pulses normal/equal, full ROM, tenderness to palpation of right shoulder.  No deformities.  Tenderness to palpation of right elbow. All other extremities/joints palpated/ranged and nontender SKIN: warm, color normal PSYCH: Anxious  ED Results / Procedures / Treatments   Labs (all labs ordered are listed, but only abnormal results are displayed) Labs Reviewed - No data to display  EKG None  Radiology DG Shoulder Right  Result Date: 03/23/2021 CLINICAL DATA:  Injury, felt pop. EXAM: RIGHT SHOULDER - 2+ VIEW COMPARISON:  None. FINDINGS: No acute bony abnormality. Specifically, no fracture, subluxation, or dislocation. Mild spurring at the rotator cuff insertion. IMPRESSION: No acute bony abnormality. Electronically Signed   By: Rolm Baptise M.D.   On: 03/23/2021 22:49   DG Elbow Complete Right  Result Date: 03/23/2021 CLINICAL DATA:  Injury, felt pop, pain EXAM: RIGHT ELBOW - COMPLETE 3+ VIEW COMPARISON:  None. FINDINGS: There is no evidence of fracture, dislocation, or joint effusion. There is no evidence of arthropathy or other focal bone abnormality. Soft tissues are unremarkable. IMPRESSION: Negative. Electronically Signed   By: Rolm Baptise M.D.   On: 03/23/2021 22:51    Procedures Procedures   Medications Ordered in ED Medications  ondansetron (ZOFRAN-ODT) disintegrating tablet 4 mg (4 mg Oral Given 03/23/21 2258)  HYDROcodone-acetaminophen (NORCO/VICODIN) 5-325 MG per tablet 1 tablet (1 tablet Oral Given 03/24/21 0017)   ibuprofen (ADVIL) tablet 400 mg (400 mg Oral Given 03/24/21 0017)    ED Course  I have reviewed the triage vital signs and the nursing notes.  Pertinent   imaging results that were available during my care of the patient were reviewed by me and considered in my medical decision making (see chart for details).    MDM Rules/Calculators/A&P                           Imaging negative.  Will place in sling, follow-up with Ortho.  He already has pain meds at home. Final Clinical Impression(s) / ED Diagnoses Final diagnoses:  Sprain of right shoulder, unspecified shoulder sprain type, initial encounter    Rx / DC Orders ED Discharge Orders     None        Ripley Fraise, MD 03/24/21 [phone]

## 2021-03-24 NOTE — Discharge Instructions (Signed)
Please take the pain medication you already have prescribed.  Please call your [facility] physicians tomorrow

## 2021-06-01 ENCOUNTER — Inpatient Hospital Stay (HOSPITAL_COMMUNITY)
Admission: EM | Admit: 2021-06-01 | Discharge: 2021-06-04 | DRG: 287 | Disposition: A | Discharge status: Home / Self Care | Payer: No Typology Code available for payment source | Attending: Internal Medicine | Admitting: Internal Medicine

## 2021-06-01 ENCOUNTER — Emergency Department (HOSPITAL_COMMUNITY): Payer: No Typology Code available for payment source

## 2021-06-01 ENCOUNTER — Other Ambulatory Visit: Payer: Self-pay

## 2021-06-01 DIAGNOSIS — R7303 Prediabetes: Secondary | ICD-10-CM | POA: Diagnosis present

## 2021-06-01 DIAGNOSIS — Z79899 Other long term (current) drug therapy: Secondary | ICD-10-CM

## 2021-06-01 DIAGNOSIS — I2510 Atherosclerotic heart disease of native coronary artery without angina pectoris: Secondary | ICD-10-CM | POA: Diagnosis present

## 2021-06-01 DIAGNOSIS — Z8719 Personal history of other diseases of the digestive system: Secondary | ICD-10-CM

## 2021-06-01 DIAGNOSIS — M542 Cervicalgia: Secondary | ICD-10-CM | POA: Diagnosis present

## 2021-06-01 DIAGNOSIS — I208 Other forms of angina pectoris: Secondary | ICD-10-CM | POA: Diagnosis not present

## 2021-06-01 DIAGNOSIS — Z86010 Personal history of colonic polyps: Secondary | ICD-10-CM

## 2021-06-01 DIAGNOSIS — Z91018 Allergy to other foods: Secondary | ICD-10-CM

## 2021-06-01 DIAGNOSIS — Z9049 Acquired absence of other specified parts of digestive tract: Secondary | ICD-10-CM

## 2021-06-01 DIAGNOSIS — K219 Gastro-esophageal reflux disease without esophagitis: Secondary | ICD-10-CM | POA: Diagnosis present

## 2021-06-01 DIAGNOSIS — Z96642 Presence of left artificial hip joint: Secondary | ICD-10-CM | POA: Diagnosis present

## 2021-06-01 DIAGNOSIS — Z6832 Body mass index (BMI) 32.0-32.9, adult: Secondary | ICD-10-CM

## 2021-06-01 DIAGNOSIS — M5412 Radiculopathy, cervical region: Secondary | ICD-10-CM | POA: Diagnosis present

## 2021-06-01 DIAGNOSIS — E66811 Obesity, class 1: Secondary | ICD-10-CM

## 2021-06-01 DIAGNOSIS — Z803 Family history of malignant neoplasm of breast: Secondary | ICD-10-CM

## 2021-06-01 DIAGNOSIS — R079 Chest pain, unspecified: Principal | ICD-10-CM | POA: Diagnosis present

## 2021-06-01 DIAGNOSIS — E039 Hypothyroidism, unspecified: Secondary | ICD-10-CM

## 2021-06-01 DIAGNOSIS — Z91013 Allergy to seafood: Secondary | ICD-10-CM

## 2021-06-01 DIAGNOSIS — R110 Nausea: Secondary | ICD-10-CM | POA: Diagnosis present

## 2021-06-01 DIAGNOSIS — Z808 Family history of malignant neoplasm of other organs or systems: Secondary | ICD-10-CM

## 2021-06-01 DIAGNOSIS — E781 Pure hyperglyceridemia: Secondary | ICD-10-CM | POA: Diagnosis present

## 2021-06-01 DIAGNOSIS — G8929 Other chronic pain: Secondary | ICD-10-CM

## 2021-06-01 DIAGNOSIS — Z79890 Hormone replacement therapy (postmenopausal): Secondary | ICD-10-CM

## 2021-06-01 DIAGNOSIS — R0789 Other chest pain: Principal | ICD-10-CM | POA: Diagnosis present

## 2021-06-01 DIAGNOSIS — R001 Bradycardia, unspecified: Secondary | ICD-10-CM | POA: Diagnosis present

## 2021-06-01 DIAGNOSIS — Z91041 Radiographic dye allergy status: Secondary | ICD-10-CM

## 2021-06-01 DIAGNOSIS — M549 Dorsalgia, unspecified: Secondary | ICD-10-CM

## 2021-06-01 DIAGNOSIS — Z981 Arthrodesis status: Secondary | ICD-10-CM

## 2021-06-01 DIAGNOSIS — N179 Acute kidney failure, unspecified: Secondary | ICD-10-CM | POA: Diagnosis present

## 2021-06-01 DIAGNOSIS — Z87891 Personal history of nicotine dependence: Secondary | ICD-10-CM

## 2021-06-01 DIAGNOSIS — E669 Obesity, unspecified: Secondary | ICD-10-CM | POA: Diagnosis present

## 2021-06-01 DIAGNOSIS — E785 Hyperlipidemia, unspecified: Secondary | ICD-10-CM | POA: Diagnosis present

## 2021-06-01 DIAGNOSIS — R112 Nausea with vomiting, unspecified: Secondary | ICD-10-CM | POA: Diagnosis present

## 2021-06-01 DIAGNOSIS — M545 Low back pain, unspecified: Secondary | ICD-10-CM | POA: Diagnosis present

## 2021-06-01 DIAGNOSIS — Z8249 Family history of ischemic heart disease and other diseases of the circulatory system: Secondary | ICD-10-CM

## 2021-06-01 DIAGNOSIS — I10 Essential (primary) hypertension: Secondary | ICD-10-CM | POA: Diagnosis present

## 2021-06-01 DIAGNOSIS — G4730 Sleep apnea, unspecified: Secondary | ICD-10-CM | POA: Diagnosis present

## 2021-06-01 DIAGNOSIS — Z20822 Contact with and (suspected) exposure to covid-19: Secondary | ICD-10-CM | POA: Diagnosis present

## 2021-06-01 LAB — CBC
HCT: 45.4 % (ref 39.0–52.0)
Hemoglobin: 15.8 g/dL (ref 13.0–17.0)
MCH: 32.8 pg (ref 26.0–34.0)
MCHC: 34.8 g/dL (ref 30.0–36.0)
MCV: 94.4 fL (ref 80.0–100.0)
Platelets: 233 10*3/uL (ref 150–400)
RBC: 4.81 MIL/uL (ref 4.22–5.81)
RDW: 12.7 % (ref 11.5–15.5)
WBC: 9.7 10*3/uL (ref 4.0–10.5)
nRBC: 0 % (ref 0.0–0.2)

## 2021-06-01 LAB — BASIC METABOLIC PANEL
Anion gap: 6 (ref 5–15)
BUN: 20 mg/dL (ref 6–20)
CO2: 27 mmol/L (ref 22–32)
Calcium: 8.7 mg/dL — ABNORMAL LOW (ref 8.9–10.3)
Chloride: 105 mmol/L (ref 98–111)
Creatinine, Ser: 1.63 mg/dL — ABNORMAL HIGH (ref 0.61–1.24)
GFR, Estimated: 50 mL/min — ABNORMAL LOW (ref >60)
Glucose, Bld: 119 mg/dL — ABNORMAL HIGH (ref 70–99)
Potassium: 4 mmol/L (ref 3.5–5.1)
Sodium: 138 mmol/L (ref 135–145)

## 2021-06-01 LAB — RESP PANEL BY RT-PCR (FLU A&B, COVID) ARPGX2
Influenza A by PCR: NEGATIVE
Influenza B by PCR: NEGATIVE
SARS Coronavirus 2 by RT PCR: NEGATIVE

## 2021-06-01 LAB — TROPONIN I (HIGH SENSITIVITY)
Troponin I (High Sensitivity): 4 ng/L (ref <18)
Troponin I (High Sensitivity): 3 ng/L (ref <18)

## 2021-06-01 LAB — D-DIMER, QUANTITATIVE: D-Dimer, Quant: <0.27 ug/mL-FEU (ref 0.00–0.50)

## 2021-06-01 MED ORDER — FAMOTIDINE IN NACL 20-0.9 MG/50ML-% IV SOLN: 20.0000 mg | Freq: Once | INTRAVENOUS | Status: DC

## 2021-06-01 MED ORDER — ASPIRIN 325 MG PO TABS
325.0000 mg | ORAL_TABLET | Freq: Once | ORAL | Status: AC
  Administered 2021-06-01: 21:00:00 325 mg via ORAL
  Filled 2021-06-01: qty 1

## 2021-06-01 MED ORDER — METOCLOPRAMIDE HCL 5 MG/ML IJ SOLN
10.0000 mg | Freq: Once | INTRAMUSCULAR | Status: AC
  Administered 2021-06-01: 20:00:00 10 mg via INTRAVENOUS
  Filled 2021-06-01: qty 2

## 2021-06-01 MED ORDER — ALUM & MAG HYDROXIDE-SIMETH 200-200-20 MG/5ML PO SUSP: 30.0000 mL | Freq: Once | ORAL | Status: DC

## 2021-06-01 MED ORDER — NITROGLYCERIN 0.4 MG SL SUBL
0.4000 mg | SUBLINGUAL_TABLET | Freq: Once | SUBLINGUAL | Status: AC
  Administered 2021-06-01: 21:00:00 0.4 mg via SUBLINGUAL
  Filled 2021-06-01: qty 1

## 2021-06-01 MED ORDER — LIDOCAINE VISCOUS HCL 2 % MT SOLN: 15.0000 mL | Freq: Once | OROMUCOSAL | Status: DC

## 2021-06-01 NOTE — ED Provider Notes (Signed)
[facility] EMERGENCY DEPARTMENT Provider Note   CSN: [id number] Arrival date & time: 06/01/21  1932     History  Chief Complaint  Patient presents with   Chest Pain    Andrew Love is a 56 y.o. male with a past medical history of CAD, GERD, who presents to the ED complaining of sharp/stabbing non-exertional, left-sided chest pain onset 3 days.  Patient has associated increased shortness of breath with exertion, fatigue, nausea.  Denies sick contacts.  His chest pain radiates to his left shoulder.  Patient has a history of a cardiac catheterization in 2009.  He does not have a cardiologist currently.  Denies fever, chills, vomiting, abdominal pain.     The history is provided by the patient. No language interpreter was used.      Home Medications Prior to Admission medications   Medication Sig Start Date End Date Taking? Authorizing Provider  acetaminophen (TYLENOL) 500 MG tablet Take 500-1,000 mg by mouth every 6 (six) hours as needed for mild pain or fever.   Yes [provider]  baclofen (LIORESAL) 10 MG tablet Take 1 tablet (10 mg total) by mouth 3 (three) times daily. As needed for muscle spasm 07/23/19  Yes Merlene Pulling K, PA-C  doxepin (SINEQUAN) 10 MG capsule Take 1 capsule by mouth at bedtime as needed. 04/08/21  Yes [provider]  EPINEPHrine (EPIPEN) 0.3 mg/0.3 mL SOAJ Inject 0.3 mLs (0.3 mg total) into the muscle as needed. Patient taking differently: Inject 0.3 mg into the muscle as needed for anaphylaxis. 12/01/12  Yes Teressa Lower, MD  escitalopram (LEXAPRO) 10 MG tablet Take 10 mg by mouth daily. 09/14/20  Yes [provider]  gabapentin (NEURONTIN) 300 MG capsule Take 1 capsule by mouth at bedtime. 12/17/20  Yes [provider]  ibuprofen (ADVIL) 200 MG tablet Take 1,000 mg by mouth every 6 (six) hours as needed.   Yes [provider]  levothyroxine (SYNTHROID, LEVOTHROID) 300 MCG tablet Take 300 mcg by mouth every  morning.  03/26/14  Yes [provider]  losartan (COZAAR) 100 MG tablet Take 100 mg by mouth daily. 09/26/20  Yes [provider]  meclizine (ANTIVERT) 25 MG tablet Take 1 tablet (25 mg total) by mouth 3 (three) times daily as needed for dizziness. 04/09/17  Yes Isla Pence, MD  naloxone [facility]) nasal spray 4 mg/0.1 mL SPRAY 1 SPRAY INTO ONE NOSTRIL AS DIRECTED FOR OPIOID OVERDOSE (TURN PERSON ON SIDE AFTER DOSE. IF NO RESPONSE IN 2-3 MINUTES OR PERSON RESPONDS BUT RELAPSES, REPEAT USING A NEW SPRAY DEVICE AND SPRAY INTO THE OTHER NOSTRIL. CALL 911 AFTER USE.) * EMERGENCY USE ONLY * 06/19/20  Yes [provider]  ondansetron (ZOFRAN) 4 MG tablet Take 1 tablet (4 mg total) by mouth every 6 (six) hours. 01/04/20  Yes Noemi Chapel, MD  pantoprazole (PROTONIX) 40 MG tablet Take 40 mg by mouth daily. 06/19/20  Yes [provider]  promethazine (PHENERGAN) 25 MG tablet Take 1 tablet (25 mg total) by mouth every 6 (six) hours as needed for nausea or vomiting. 04/10/19  Yes Jacqlyn Larsen, PA-C  dexlansoprazole (DEXILANT) 60 MG capsule Take 1 capsule (60 mg total) by mouth daily before breakfast. Patient not taking: Reported on 06/01/2021 04/18/19   Mahala Menghini, PA-C  dicyclomine (BENTYL) 10 MG capsule Take 1 capsule (10 mg total) by mouth 4 (four) times daily -  before meals and at bedtime. As needed for loose stool, abdominal cramping. Hold if  constipation. Patient not taking: Reported on 06/01/2021 04/18/19   Mahala Menghini, PA-C  fluticasone [facility]) 50 MCG/ACT nasal spray Place 2 sprays into both nostrils daily. Patient not taking: Reported on 06/01/2021 02/26/19   Wurst, [place], PA-C  HYDROmorphone (DILAUDID) 2 MG tablet Take 1 tablet (2 mg total) by mouth every 4 (four) hours as needed for severe pain. Patient not taking: Reported on 08/06/2019 07/23/19   Ventura Bruns, PA-C  methylPREDNISolone (MEDROL DOSEPAK) 4 MG TBPK tablet Take taper as prescribed Patient  not taking: Reported on 06/01/2021 04/11/20   Kinnie Feil, PA-C  sennosides-docusate sodium (SENOKOT-S) 8.6-50 MG tablet Take 2 tablets by mouth daily. Patient not taking: Reported on 08/06/2019 07/23/19   Ventura Bruns, PA-C      Allergies    Dust mite mixed allergen ext [mite (d. farinae)], Other, Iohexol, Almond (diagnostic), Oysters [shellfish allergy], Wheat bran, and Yeast-related products    Review of Systems   Review of Systems  Constitutional:  Negative for chills and fever.  Respiratory:  Positive for shortness of breath.   Cardiovascular:  Positive for chest pain.  Gastrointestinal:  Positive for nausea. Negative for abdominal pain and vomiting.  Skin:  Negative for rash.  All other systems reviewed and are negative.  Physical Exam Updated Vital Signs BP 102/64    Pulse 60    Temp 97.9 F (36.6 C) (Oral)    Resp 15    Ht 6' (1.829 m)    Wt 106.6 kg    SpO2 96%    BMI 31.87 kg/m  Physical Exam Vitals and nursing note reviewed.  Constitutional:      General: He is not in acute distress.    Appearance: He is not diaphoretic.  HENT:     Head: Normocephalic and atraumatic.     Mouth/Throat:     Pharynx: No oropharyngeal exudate.  Eyes:     General: No scleral icterus.    Conjunctiva/sclera: Conjunctivae normal.  Cardiovascular:     Rate and Rhythm: Normal rate and regular rhythm.     Pulses: Normal pulses.     Heart sounds: Normal heart sounds.  Pulmonary:     Effort: Pulmonary effort is normal. No respiratory distress.     Breath sounds: Normal breath sounds. No wheezing.  Chest:     Chest wall: Tenderness present.     Comments: Sternal chest wall tenderness.  No overlying skin changes. Abdominal:     General: Bowel sounds are normal.     Palpations: Abdomen is soft. There is no mass.     Tenderness: There is no abdominal tenderness. There is no guarding or rebound.  Musculoskeletal:        General: Normal range of motion.     Cervical back: Normal range  of motion and neck supple.  Skin:    General: Skin is warm and dry.  Neurological:     Mental Status: He is alert.  Psychiatric:        Behavior: Behavior normal.    ED Results / Procedures / Treatments   Labs (all labs ordered are listed, but only abnormal results are displayed) Labs Reviewed  BASIC METABOLIC PANEL - Abnormal; Notable for the following components:      Result Value   Glucose, Bld 119 (*)    Creatinine, Ser 1.63 (*)    Calcium 8.7 (*)    GFR, Estimated 50 (*)    All other components within normal limits  RESP PANEL BY RT-PCR (  FLU A&B, COVID) ARPGX2  CBC  D-DIMER, QUANTITATIVE  TROPONIN I (HIGH SENSITIVITY)  TROPONIN I (HIGH SENSITIVITY)    EKG EKG Interpretation  Date/Time:  Wednesday June 01 2021 19:41:37 EST Ventricular Rate:  76 PR Interval:  164 QRS Duration: 80 QT Interval:  364 QTC Calculation: 409 R Axis:   -7 Text Interpretation: Normal sinus rhythm Possible Lateral infarct , age undetermined Abnormal ECG Confirmed by Carmin Muskrat [phone]) on 06/01/2021 8:04:40 PM  Radiology DG Chest Port 1 View  Result Date: 06/01/2021 CLINICAL DATA:  Chest pain. EXAM: PORTABLE CHEST 1 VIEW COMPARISON:  Chest x-ray 12/07/2020. FINDINGS: The heart size and mediastinal contours are within normal limits. Both lungs are clear. The visualized skeletal structures are unremarkable. IMPRESSION: No active disease. Electronically Signed   By: Ronney Asters M.D.   On: 06/01/2021 20:52    Procedures Procedures    Medications Ordered in ED Medications  alum & mag hydroxide-simeth (MAALOX/MYLANTA) 200-200-20 MG/5ML suspension 30 mL (0 mLs Oral Hold 06/01/21 2231)    And  lidocaine (XYLOCAINE) 2 % viscous mouth solution 15 mL (0 mLs Oral Hold 06/01/21 2232)  famotidine (PEPCID) IVPB 20 mg premix (0 mg Intravenous Hold 06/01/21 2232)  metoCLOPramide (REGLAN) injection 10 mg (10 mg Intravenous Given 06/01/21 2026)  nitroGLYCERIN (NITROSTAT) SL tablet 0.4 mg (0.4 mg  Sublingual Given 06/01/21 2044)  aspirin tablet 325 mg (325 mg Oral Given 06/01/21 2043)    ED Course/ Medical Decision Making/ A&P Clinical Course as of 06/01/21 2310  Wed Jun 01, 2021  2102 D-Dimer, Quant: <0.27 [SB]  2110 Notify chest pain improved to 2/10 after nitroglycerin.  Nurse notified me that patient asked for a GI cocktail. [SB]  2219 Patient reevaluated and noted to be resting comfortably on stretcher asleep.  He notes that his chest pain is now at 1-2/10. [SB]    Clinical Course User Index [SB] Marshawn Normoyle A, PA-C                           Medical Decision Making Amount and/or Complexity of Data Reviewed Labs: ordered. Decision-making details documented in ED Course. Radiology: ordered.  Risk OTC drugs. Prescription drug management.   Patient presents to the ED with left-sided, sharp/stabbing chest pain x 3 days.  Cardiac catheterization in 2009 without stent placement.  No prior history of MI, DVT/PE.  Patient with history of CAD, hypertension.  Vital signs stable, patient afebrile, not tachycardic, not tachypneic, not hypoxic.  On exam patient with chest wall tenderness to palpation to sternal region.  No overlying skin changes or deformities.  No acute cardiovascular, respiratory, abdominal exam findings.  Differential diagnosis includes ACS, aortic dissection, pneumothorax, PE, pneumonia, COVID, flu.   Labs:  I ordered, and personally interpreted labs.  The pertinent results include:  Initial troponin at 4, repeat troponin ordered with results pending at time of signout.  D-dimer unremarkable at less than 0.27.  CBC without leukocytosis and otherwise unremarkable. BMP with elevated creatinine at 1.63.  COVID and flu swab negative.  Imaging: I ordered imaging studies including chest x-ray I independently visualized and interpreted imaging which showed no acute cardiopulmonary process. I agree with the radiologist interpretation  Medications:  I ordered  medication including Reglan, aspirin, nitroglycerin for nausea and pain management Reevaluation of the patient after these medicines showed that the patient improved I have reviewed the patients home medicines and have made adjustments as needed  Reevaluation: After the interventions noted  above, I reevaluated the patient and found that they have :improved  Patient case discussed with Dr. Dina Rich at sign-out. Plan at sign-out is likely admission to hospital due to elevated cardiac risk factors, elevated heart score of 4.  Labs pending at time of signout is repeat troponin. Patient care transferred at sign out.    This chart was dictated using voice recognition software, Dragon. Despite the best efforts of this provider to proofread and correct errors, errors may still occur which can change documentation meaning.  Final Clinical Impression(s) / ED Diagnoses Final diagnoses:  Chest pain, unspecified type    Rx / DC Orders ED Discharge Orders     None         Maralyn Witherell A, PA-C 06/01/21 2310    Merryl Hacker, MD 06/01/21 2354

## 2021-06-01 NOTE — ED Notes (Signed)
Pt ambulated to the bathroom unassisted.  

## 2021-06-01 NOTE — ED Triage Notes (Signed)
Patient states CP for 2 days with increased SHOB, fatigue, and nausea. Radiation to left shoulder. Hx of cardiac cath with 30% blockage.

## 2021-06-02 ENCOUNTER — Observation Stay (HOSPITAL_BASED_OUTPATIENT_CLINIC_OR_DEPARTMENT_OTHER): Payer: No Typology Code available for payment source

## 2021-06-02 ENCOUNTER — Encounter (HOSPITAL_COMMUNITY): Payer: Self-pay | Admitting: Internal Medicine

## 2021-06-02 DIAGNOSIS — Z86010 Personal history of colonic polyps: Secondary | ICD-10-CM | POA: Diagnosis not present

## 2021-06-02 DIAGNOSIS — Z9049 Acquired absence of other specified parts of digestive tract: Secondary | ICD-10-CM | POA: Diagnosis not present

## 2021-06-02 DIAGNOSIS — I25110 Atherosclerotic heart disease of native coronary artery with unstable angina pectoris: Secondary | ICD-10-CM | POA: Diagnosis not present

## 2021-06-02 DIAGNOSIS — N179 Acute kidney failure, unspecified: Secondary | ICD-10-CM | POA: Diagnosis present

## 2021-06-02 DIAGNOSIS — R072 Precordial pain: Secondary | ICD-10-CM | POA: Diagnosis not present

## 2021-06-02 DIAGNOSIS — M549 Dorsalgia, unspecified: Secondary | ICD-10-CM

## 2021-06-02 DIAGNOSIS — Z20822 Contact with and (suspected) exposure to covid-19: Secondary | ICD-10-CM | POA: Diagnosis present

## 2021-06-02 DIAGNOSIS — I200 Unstable angina: Secondary | ICD-10-CM | POA: Diagnosis not present

## 2021-06-02 DIAGNOSIS — Z87891 Personal history of nicotine dependence: Secondary | ICD-10-CM | POA: Diagnosis not present

## 2021-06-02 DIAGNOSIS — E781 Pure hyperglyceridemia: Secondary | ICD-10-CM | POA: Diagnosis present

## 2021-06-02 DIAGNOSIS — E66811 Obesity, class 1: Secondary | ICD-10-CM

## 2021-06-02 DIAGNOSIS — I208 Other forms of angina pectoris: Secondary | ICD-10-CM | POA: Diagnosis present

## 2021-06-02 DIAGNOSIS — G4730 Sleep apnea, unspecified: Secondary | ICD-10-CM | POA: Diagnosis present

## 2021-06-02 DIAGNOSIS — Z8249 Family history of ischemic heart disease and other diseases of the circulatory system: Secondary | ICD-10-CM | POA: Diagnosis not present

## 2021-06-02 DIAGNOSIS — I25118 Atherosclerotic heart disease of native coronary artery with other forms of angina pectoris: Secondary | ICD-10-CM | POA: Diagnosis not present

## 2021-06-02 DIAGNOSIS — I2510 Atherosclerotic heart disease of native coronary artery without angina pectoris: Secondary | ICD-10-CM | POA: Diagnosis present

## 2021-06-02 DIAGNOSIS — E669 Obesity, unspecified: Secondary | ICD-10-CM | POA: Diagnosis present

## 2021-06-02 DIAGNOSIS — R079 Chest pain, unspecified: Secondary | ICD-10-CM | POA: Diagnosis not present

## 2021-06-02 DIAGNOSIS — R110 Nausea: Secondary | ICD-10-CM | POA: Diagnosis not present

## 2021-06-02 DIAGNOSIS — R0789 Other chest pain: Secondary | ICD-10-CM | POA: Diagnosis present

## 2021-06-02 DIAGNOSIS — R7303 Prediabetes: Secondary | ICD-10-CM | POA: Diagnosis present

## 2021-06-02 DIAGNOSIS — R0609 Other forms of dyspnea: Secondary | ICD-10-CM | POA: Diagnosis not present

## 2021-06-02 DIAGNOSIS — Z808 Family history of malignant neoplasm of other organs or systems: Secondary | ICD-10-CM | POA: Diagnosis not present

## 2021-06-02 DIAGNOSIS — G8929 Other chronic pain: Secondary | ICD-10-CM | POA: Diagnosis present

## 2021-06-02 DIAGNOSIS — E039 Hypothyroidism, unspecified: Secondary | ICD-10-CM

## 2021-06-02 DIAGNOSIS — Z91041 Radiographic dye allergy status: Secondary | ICD-10-CM | POA: Diagnosis not present

## 2021-06-02 DIAGNOSIS — M542 Cervicalgia: Secondary | ICD-10-CM | POA: Diagnosis present

## 2021-06-02 DIAGNOSIS — Z981 Arthrodesis status: Secondary | ICD-10-CM | POA: Diagnosis not present

## 2021-06-02 DIAGNOSIS — I10 Essential (primary) hypertension: Secondary | ICD-10-CM | POA: Diagnosis present

## 2021-06-02 DIAGNOSIS — Z96642 Presence of left artificial hip joint: Secondary | ICD-10-CM | POA: Diagnosis present

## 2021-06-02 DIAGNOSIS — Z803 Family history of malignant neoplasm of breast: Secondary | ICD-10-CM | POA: Diagnosis not present

## 2021-06-02 DIAGNOSIS — Z6832 Body mass index (BMI) 32.0-32.9, adult: Secondary | ICD-10-CM | POA: Diagnosis not present

## 2021-06-02 DIAGNOSIS — E785 Hyperlipidemia, unspecified: Secondary | ICD-10-CM | POA: Diagnosis present

## 2021-06-02 DIAGNOSIS — Z8719 Personal history of other diseases of the digestive system: Secondary | ICD-10-CM | POA: Diagnosis not present

## 2021-06-02 LAB — ECHOCARDIOGRAM COMPLETE
AR max vel: 2.58 cm2
AV Area VTI: 2.83 cm2
AV Area mean vel: 2.3 cm2
AV Mean grad: 3 mmHg
AV Peak grad: 6 mmHg
Ao pk vel: 1.22 m/s
Area-P 1/2: 3.17 cm2
Calc EF: 65.5 %
Height: 72 in
MV VTI: 1.82 cm2
S' Lateral: 3 cm
Single Plane A2C EF: 65.3 %
Single Plane A4C EF: 64.3 %
Weight: 3808 oz

## 2021-06-02 LAB — HIV ANTIBODY (ROUTINE TESTING W REFLEX): HIV Screen 4th Generation wRfx: NONREACTIVE

## 2021-06-02 LAB — CBC
HCT: 43.5 % (ref 39.0–52.0)
Hemoglobin: 15.1 g/dL (ref 13.0–17.0)
MCH: 32.7 pg (ref 26.0–34.0)
MCHC: 34.7 g/dL (ref 30.0–36.0)
MCV: 94.2 fL (ref 80.0–100.0)
Platelets: 174 10*3/uL (ref 150–400)
RBC: 4.62 MIL/uL (ref 4.22–5.81)
RDW: 12.8 % (ref 11.5–15.5)
WBC: 8.7 10*3/uL (ref 4.0–10.5)
nRBC: 0 % (ref 0.0–0.2)

## 2021-06-02 LAB — COMPREHENSIVE METABOLIC PANEL
ALT: 46 U/L — ABNORMAL HIGH (ref 0–44)
AST: 28 U/L (ref 15–41)
Albumin: 4.1 g/dL (ref 3.5–5.0)
Alkaline Phosphatase: 80 U/L (ref 38–126)
Anion gap: 10 (ref 5–15)
BUN: 20 mg/dL (ref 6–20)
CO2: 24 mmol/L (ref 22–32)
Calcium: 8.9 mg/dL (ref 8.9–10.3)
Chloride: 105 mmol/L (ref 98–111)
Creatinine, Ser: 1.54 mg/dL — ABNORMAL HIGH (ref 0.61–1.24)
GFR, Estimated: 53 mL/min — ABNORMAL LOW (ref >60)
Glucose, Bld: 120 mg/dL — ABNORMAL HIGH (ref 70–99)
Potassium: 3.7 mmol/L (ref 3.5–5.1)
Sodium: 139 mmol/L (ref 135–145)
Total Bilirubin: 0.9 mg/dL (ref 0.3–1.2)
Total Protein: 6.8 g/dL (ref 6.5–8.1)

## 2021-06-02 LAB — LIPID PANEL
Cholesterol: 179 mg/dL (ref 0–200)
HDL: 26 mg/dL — ABNORMAL LOW (ref >40)
LDL Cholesterol: 102 mg/dL — ABNORMAL HIGH (ref 0–99)
Total CHOL/HDL Ratio: 6.9 RATIO
Triglycerides: 255 mg/dL — ABNORMAL HIGH (ref <150)
VLDL: 51 mg/dL — ABNORMAL HIGH (ref 0–40)

## 2021-06-02 LAB — PHOSPHORUS: Phosphorus: 3.8 mg/dL (ref 2.5–4.6)

## 2021-06-02 LAB — MAGNESIUM: Magnesium: 2.5 mg/dL — ABNORMAL HIGH (ref 1.7–2.4)

## 2021-06-02 MED ORDER — LEVOTHYROXINE SODIUM 100 MCG PO TABS
300.0000 ug | ORAL_TABLET | Freq: Every morning | ORAL | Status: DC
  Administered 2021-06-02 – 2021-06-04 (×3): 300 ug via ORAL
  Filled 2021-06-02 (×3): qty 3

## 2021-06-02 MED ORDER — LOSARTAN POTASSIUM 50 MG PO TABS
100.0000 mg | ORAL_TABLET | Freq: Every day | ORAL | Status: DC
  Administered 2021-06-02: 100 mg via ORAL
  Filled 2021-06-02: qty 2

## 2021-06-02 MED ORDER — PANTOPRAZOLE SODIUM 40 MG PO TBEC
40.0000 mg | DELAYED_RELEASE_TABLET | Freq: Every day | ORAL | Status: DC
  Administered 2021-06-02 – 2021-06-04 (×3): 40 mg via ORAL
  Filled 2021-06-02 (×3): qty 1

## 2021-06-02 MED ORDER — BACLOFEN 10 MG PO TABS
10.0000 mg | ORAL_TABLET | Freq: Three times a day (TID) | ORAL | Status: DC | PRN
  Administered 2021-06-02: 10 mg via ORAL
  Filled 2021-06-02: qty 1

## 2021-06-02 MED ORDER — SODIUM CHLORIDE 0.9 % IV SOLN: INTRAVENOUS | Status: DC

## 2021-06-02 MED ORDER — SODIUM CHLORIDE 0.9 % IV SOLN: 250.0000 mL | INTRAVENOUS | Status: DC | PRN

## 2021-06-02 MED ORDER — NITROGLYCERIN 0.4 MG SL SUBL: 0.4000 mg | SUBLINGUAL_TABLET | SUBLINGUAL | Status: DC | PRN

## 2021-06-02 MED ORDER — ACETAMINOPHEN 325 MG PO TABS
650.0000 mg | ORAL_TABLET | ORAL | Status: DC | PRN
  Administered 2021-06-02 (×2): 650 mg via ORAL
  Filled 2021-06-02 (×2): qty 2

## 2021-06-02 MED ORDER — PREDNISONE 20 MG PO TABS
50.0000 mg | ORAL_TABLET | Freq: Four times a day (QID) | ORAL | Status: DC
  Administered 2021-06-02 – 2021-06-03 (×2): 50 mg via ORAL
  Filled 2021-06-02 (×2): qty 1

## 2021-06-02 MED ORDER — ONDANSETRON HCL 4 MG/2ML IJ SOLN
4.0000 mg | Freq: Four times a day (QID) | INTRAMUSCULAR | Status: DC | PRN
  Administered 2021-06-02 (×2): 4 mg via INTRAVENOUS
  Filled 2021-06-02 (×2): qty 2

## 2021-06-02 MED ORDER — SODIUM CHLORIDE 0.9% FLUSH: 3.0000 mL | INTRAVENOUS | Status: DC | PRN

## 2021-06-02 MED ORDER — ASPIRIN EC 81 MG PO TBEC
81.0000 mg | DELAYED_RELEASE_TABLET | Freq: Every day | ORAL | Status: DC
  Administered 2021-06-02 – 2021-06-04 (×2): 81 mg via ORAL
  Filled 2021-06-02 (×2): qty 1

## 2021-06-02 MED ORDER — ZOLPIDEM TARTRATE 5 MG PO TABS
10.0000 mg | ORAL_TABLET | Freq: Every evening | ORAL | Status: DC | PRN
  Administered 2021-06-02 – 2021-06-03 (×2): 10 mg via ORAL
  Filled 2021-06-02 (×2): qty 2

## 2021-06-02 MED ORDER — GABAPENTIN 300 MG PO CAPS
300.0000 mg | ORAL_CAPSULE | Freq: Every day | ORAL | Status: DC
  Administered 2021-06-02 – 2021-06-03 (×2): 300 mg via ORAL
  Filled 2021-06-02 (×2): qty 1

## 2021-06-02 MED ORDER — MELATONIN 3 MG PO TABS
6.0000 mg | ORAL_TABLET | Freq: Once | ORAL | Status: AC
  Administered 2021-06-02: 6 mg via ORAL
  Filled 2021-06-02: qty 2

## 2021-06-02 MED ORDER — ASPIRIN 81 MG PO CHEW
81.0000 mg | CHEWABLE_TABLET | ORAL | Status: AC
  Administered 2021-06-03: 81 mg via ORAL
  Filled 2021-06-02: qty 1

## 2021-06-02 MED ORDER — SODIUM CHLORIDE 0.9% FLUSH
3.0000 mL | Freq: Two times a day (BID) | INTRAVENOUS | Status: DC
  Administered 2021-06-04: 3 mL via INTRAVENOUS

## 2021-06-02 MED ORDER — SODIUM CHLORIDE 0.9% FLUSH: 3.0000 mL | Freq: Two times a day (BID) | INTRAVENOUS | Status: DC

## 2021-06-02 MED ORDER — ROSUVASTATIN CALCIUM 20 MG PO TABS
20.0000 mg | ORAL_TABLET | Freq: Every day | ORAL | Status: DC
  Administered 2021-06-02 – 2021-06-03 (×2): 20 mg via ORAL
  Filled 2021-06-02 (×2): qty 1

## 2021-06-02 MED ORDER — ENOXAPARIN SODIUM 40 MG/0.4ML IJ SOSY
40.0000 mg | PREFILLED_SYRINGE | INTRAMUSCULAR | Status: DC
  Administered 2021-06-02 – 2021-06-04 (×2): 40 mg via SUBCUTANEOUS
  Filled 2021-06-02 (×2): qty 0.4

## 2021-06-02 MED ORDER — SODIUM CHLORIDE 0.9 % IV SOLN: INTRAVENOUS | Status: AC

## 2021-06-02 MED ORDER — DIPHENHYDRAMINE HCL 50 MG/ML IJ SOLN: 50.0000 mg | Freq: Once | INTRAMUSCULAR | Status: AC

## 2021-06-02 MED ORDER — DIPHENHYDRAMINE HCL 25 MG PO CAPS
50.0000 mg | ORAL_CAPSULE | Freq: Once | ORAL | Status: AC
  Administered 2021-06-03: 50 mg via ORAL

## 2021-06-02 NOTE — Consult Note (Addendum)
Cardiology Consultation:   Patient ID: Andrew Love MRN: [id number]; DOB: 1965/08/28  Admit date: 06/01/2021 Date of Consult: 06/02/2021  PCP:  [facility], [place] [facility] Cardiologist: Previously followed by Dr. Lattie Haw in 2013 but no recent follow-up  Patient Profile:   Andrew Love is a 56 y.o. male with a hx of CAD (s/p cath in 2009 showing 30% RCA stenosis and likely microvascular angina, low-risk NST in 2014), chronic back pain, HTN, hypothyroidism, diverticulosis and GERD who is being seen 06/02/2021 for the evaluation of chest pain at the request of Dr. Josephine Cables.  History of Present Illness:   Andrew Love reports a variety of symptoms today including known back pain along with worsening nausea and vomiting for the past several days. Reports decreased PO intake during this time. His biggest concern which made him come to the ED yesterday was progressive dyspnea on exertion for the past 3-4 days. Says he usually walks on the treadmill several days a week but for the past few days, he is getting short of breath with minimal activity such as walking around in his home or in his yard to check on his dogs. Reports associated shooting pain when this occurs which can radiate into his left arm or neck. No recent orthopnea, PND or pitting edema.   Initial labs show WBC 9.7, Hgb 15.8, platelets 233, Na+ 138, K+ 4.0 and creatinine 1.63 (no recent values for comparison but was at 1.19 in 07/2019). AST 28 and ALT 46. D-dimer negative. Initial and repeat Hs Troponin negative at 3 and 4. Hgb A1c pending. FLP shows total cholesterol of 179, HDL 26, triglycerides 255 and LDL 102. CXR with no acute findings. EKG shows NSR, HR 76 with no acute ST changes.   This morning, he reports feeling comfortable at rest but was short of breath when walking around him room earlier today and also reports shooting pains in his chest at that time. Still with intermittent nausea and just  received Zofran.    Past Medical History:  Diagnosis Date   Arthritis    CAD (coronary artery disease)    Minor disease with suspected microvascular angina 2009 - [facility]; negative Myoview 2014   Chronic back pain    Chronic left hip pain    Colitis    Per medical history from dated 06/13/10.   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   Sigmoid colon ulcer    Rectal polyps   Sleep apnea with use of continuous positive airway pressure (CPAP)    Urticaria     Past Surgical History:  Procedure Laterality Date   APPENDECTOMY     8th grade   BACK SURGERY     CARDIAC CATHETERIZATION  2009   CARDIAC CATHETERIZATION     Per medical history from dated 06/13/10.   CERVICAL SPINE SURGERY     C4, C5, C6 spinal fusion   CHOLECYSTECTOMY N/A 08/31/2015   Procedure: LAPAROSCOPIC CHOLECYSTECTOMY WITH INTRAOPERATIVE CHOLANGIOGRAM;  Surgeon: Excell Seltzer, MD;  Location: [facility];  Service: General;  Laterality: N/A;   COLONOSCOPY  07/2008   Colitis,NSAID v. Ischemia,malrotation of the gut,Diverticulosis(L),hyperplastic   COLONOSCOPY  N/A 12/13/2012   Dr. Oneida Alar: Normal TI, mild sigmoid diverticulosis, hemorrhoids, 2 polyps (tubular adenoma). Random colon bx negative. Next TCS 12/2022 with Fentanyl/phenergan   ESOPHAGOGASTRODUODENOSCOPY N/A 04/16/2014   RMR: Erosive reflux esophagitis. Non critical Schzki's ring not manipulated. Small hiatal hernia. Abnormal gastirc mucosa of doubtful signigicance status post biopsy. I suspect trivial upper GI bleed. Recent abdominal pain presumably secondary to a protracted bout of diverticulitis. CT scan November 23 revealed improvemetn without complication. He finished his antibiotics 2 days age.  Left sided ab   JOINT REPLACEMENT     LAPAROSCOPIC SIGMOID COLECTOMY  2012   Dr. Fanny Skates: recurrent sigmoid diverticulitis   LAPAROSCOPIC SIGMOID COLECTOMY  2012   recurernt sigmoid diverticulitis, Dr. Irish Elders CUFF REPAIR     Left - per medical history form dated 06/13/10.   SHOULDER SURGERY     Left   THYROIDECTOMY     Per medical history form dated 06/13/10.   TOTAL HIP ARTHROPLASTY  07/04/2011   Procedure: TOTAL HIP ARTHROPLASTY;  Surgeon: Johnny Bridge, MD;  Location: [facility];  Service: Orthopedics;  Laterality: Right;   TOTAL HIP ARTHROPLASTY Left 07/22/2019   Procedure: TOTAL HIP ARTHROPLASTY;  Surgeon: Marchia Bond, MD;  Location: [facility];  Service: Orthopedics;  Laterality: Left;     Home Medications:  Prior to Admission medications   Medication Sig Start Date End Date Taking? Authorizing Provider  acetaminophen (TYLENOL) 500 MG tablet Take 500-1,000 mg by mouth every 6 (six) hours as needed for mild pain or fever.   Yes [provider]  baclofen (LIORESAL) 10 MG tablet Take 1 tablet (10 mg total) by mouth 3 (three) times daily. As needed for muscle spasm 07/23/19  Yes Merlene Pulling K, PA-C  doxepin (SINEQUAN) 10 MG capsule Take 1 capsule by mouth at bedtime as needed. 04/08/21  Yes [provider]  EPINEPHrine (EPIPEN) 0.3 mg/0.3 mL SOAJ Inject 0.3 mLs (0.3 mg total) into the muscle as needed. Patient taking differently: Inject 0.3 mg into the muscle as needed for anaphylaxis. 12/01/12  Yes Teressa Lower, MD  escitalopram (LEXAPRO) 10 MG tablet Take 10 mg by mouth daily. 09/14/20  Yes [provider]  gabapentin (NEURONTIN) 300 MG capsule Take 1 capsule by mouth at bedtime. 12/17/20  Yes [provider]  ibuprofen (ADVIL) 200 MG tablet Take 1,000 mg by mouth every 6 (six) hours as needed.   Yes [provider]  levothyroxine (SYNTHROID, LEVOTHROID) 300 MCG tablet Take 300 mcg by mouth every morning.  03/26/14  Yes [provider]  losartan (COZAAR) 100 MG tablet Take 100 mg by mouth daily. 09/26/20  Yes [provider]  meclizine (ANTIVERT) 25 MG tablet Take 1 tablet (25 mg total) by mouth 3 (three) times daily as needed for dizziness. 04/09/17  Yes Isla Pence, MD  naloxone [facility]) nasal spray 4 mg/0.1 mL SPRAY 1 SPRAY INTO ONE NOSTRIL AS DIRECTED FOR OPIOID OVERDOSE (TURN PERSON ON SIDE AFTER DOSE. IF NO RESPONSE IN 2-3 MINUTES OR PERSON RESPONDS BUT RELAPSES, REPEAT USING A NEW SPRAY DEVICE AND SPRAY INTO THE OTHER NOSTRIL. CALL 911 AFTER USE.) * EMERGENCY USE ONLY * 06/19/20  Yes [provider]  ondansetron (ZOFRAN) 4 MG tablet Take 1 tablet (4 mg total) by mouth every 6 (six) hours. 01/04/20  Yes Noemi Chapel, MD  pantoprazole (PROTONIX) 40 MG tablet Take 40 mg by mouth daily. 06/19/20  Yes [provider]  promethazine (PHENERGAN) 25 MG tablet Take  1 tablet (25 mg total) by mouth every 6 (six) hours as needed for nausea or vomiting. 04/10/19  Yes Jacqlyn Larsen, PA-C  dexlansoprazole (DEXILANT) 60 MG capsule Take 1 capsule (60 mg total) by mouth daily before breakfast. Patient not taking: Reported on 06/01/2021 04/18/19   Mahala Menghini, PA-C  dicyclomine (BENTYL) 10 MG capsule Take 1 capsule (10 mg total) by mouth 4 (four) times daily -  before meals and at bedtime. As needed for loose stool, abdominal cramping. Hold if constipation. Patient not taking: Reported on 06/01/2021 04/18/19   Mahala Menghini, PA-C  fluticasone [facility]) 50 MCG/ACT nasal spray Place 2 sprays into both nostrils daily. Patient not taking: Reported on 06/01/2021 02/26/19   Wurst, [place], PA-C  HYDROmorphone (DILAUDID) 2 MG tablet Take 1 tablet (2 mg total) by mouth every 4 (four) hours as needed for severe pain. Patient not taking: Reported on 08/06/2019 07/23/19   Ventura Bruns, PA-C  methylPREDNISolone (MEDROL DOSEPAK) 4 MG TBPK tablet Take taper as prescribed Patient not taking: Reported on 06/01/2021  04/11/20   Kinnie Feil, PA-C  sennosides-docusate sodium (SENOKOT-S) 8.6-50 MG tablet Take 2 tablets by mouth daily. Patient not taking: Reported on 08/06/2019 07/23/19   Ventura Bruns, PA-C    Inpatient Medications: Scheduled Meds:  alum & mag hydroxide-simeth  30 mL Oral Once   And   lidocaine  15 mL Oral Once   aspirin EC  81 mg Oral Daily   [START ON 06/03/2021] diphenhydrAMINE  50 mg Oral Once   Or   [START ON 06/03/2021] diphenhydrAMINE  50 mg Intravenous Once   enoxaparin (LOVENOX) injection  40 mg Subcutaneous Q24H   gabapentin  300 mg Oral QHS   levothyroxine  300 mcg Oral q morning   pantoprazole  40 mg Oral Daily   predniSONE  50 mg Oral Q6H   rosuvastatin  20 mg Oral QHS   sodium chloride flush  3 mL Intravenous Q12H   Continuous Infusions:  sodium chloride 100 mL/hr at 06/02/21 0401   famotidine Stopped (06/01/21 2232)   PRN Meds: acetaminophen, baclofen, nitroGLYCERIN, ondansetron (ZOFRAN) IV, zolpidem  Allergies:    Allergies  Allergen Reactions   Dust Mite Mixed Allergen Ext [Mite (D. Farinae)] Anaphylaxis and Hives   Other Hives and Shortness Of Breath    Cockroaches   Iohexol Hives   Almond (Diagnostic) Hives   Oysters [employer] Allergy] Hives   Wheat Bran Hives   Yeast-Related Products Hives    Social History:   Social History   Tobacco Use   Smoking status: Former    Packs/day: 0.50    Years: 18.00    Pack years: 9.00    Types: Cigarettes    Quit date: 02/15/2019    Years since quitting: 2.2   Smokeless tobacco: Never   Tobacco comments:       Substance Use Topics   Alcohol use: No    Alcohol/week: 0.0 standard drinks     Family History:    Family History  Problem Relation Age of Onset   Cancer Mother        breast cancer - per medical history form dated 06/13/10.   Diverticulitis Mother    Hypertension Mother    Cancer Father        skin - per medical history form dated 06/13/10.   Hypertension Father    Anesthesia problems  Neg Hx    Hypotension Neg Hx    Malignant hyperthermia Neg Hx  Pseudochol deficiency Neg Hx    Colon cancer Neg Hx      ROS:  Please see the history of present illness.  All other ROS reviewed and negative.     Physical Exam/Data:   Vitals:   06/02/21 0137 06/02/21 0201 06/02/21 0418 06/02/21 0948  BP:  134/81 116/73 129/75  Pulse:  65 65 65  Resp:  18 18 20   Temp: 97.9 F (36.6 C) 97.8 F (36.6 C) 98 F (36.7 C) 98 F (36.7 C)  TempSrc: Oral Oral Oral Oral  SpO2:  97% 94% 95%  Weight:  108 kg    Height:  6' (1.829 m)      Intake/Output Summary (Last 24 hours) at 06/02/2021 1021 Last data filed at 06/02/2021 8657 Gross per 24 hour  Intake 268.11 ml  Output --  Net 268.11 ml   Last 3 Weights 06/02/2021 06/01/2021 03/23/2021  Weight (lbs) 238 lb 235 lb 234 lb  Weight (kg) 107.956 kg 106.595 kg 106.142 kg     Body mass index is 32.28 kg/m.  General:  Well nourished, well developed male appearing in no acute distress HEENT: normal Neck: no JVD Vascular: No carotid bruits; Distal pulses 2+ bilaterally Cardiac:  normal S1, S2; RRR; no murmur. Lungs:  clear to auscultation bilaterally, no wheezing, rhonchi or rales  Abd: soft, nontender, no hepatomegaly  Ext: no pitting edema Musculoskeletal:  No deformities, BUE and BLE strength normal and equal Skin: warm and dry  Neuro:  CNs 2-12 intact, no focal abnormalities noted Psych:  Normal affect   EKG:  The EKG was personally reviewed and demonstrates: NSR, HR 76 with no acute ST changes.  Telemetry:  Telemetry was personally reviewed and demonstrates:  NSR, HR in 50's to 70's.   Relevant CV Studies:  NST: 09/2012 Impression Exercise Capacity:  Lexiscan with no exercise. BP Response:  Normal blood pressure response. Clinical Symptoms:  No significant symptoms noted. ECG Impression:  No significant ECG changes with Lexiscan. Comparison with Prior Nuclear Study: No significant change from previous study in 2009    Overall Impression:  Normal stress nuclear study.   LV Wall Motion:  NL LV Function; NL Wall Motion; EF 55%  Echocardiogram: 03/2014 Study Conclusions   - Left ventricle: The cavity size was normal. There was mild    concentric hypertrophy. Systolic function was normal. The    estimated ejection fraction was in the range of 60% to 65%. Wall    motion was normal; there were no regional wall motion    abnormalities.  - Mitral valve: There was trivial regurgitation.   Laboratory Data:  High Sensitivity Troponin:   Recent Labs  Lab 06/01/21 2016 06/01/21 2208  TROPONINIHS 4 3     Chemistry Recent Labs  Lab 06/01/21 2016 06/02/21 0544  NA 138 139  K 4.0 3.7  CL 105 105  CO2 27 24  GLUCOSE 119* 120*  BUN 20 20  CREATININE 1.63* 1.54*  CALCIUM 8.7* 8.9  MG  --  2.5*  GFRNONAA 50* 53*  ANIONGAP 6 10    Recent Labs  Lab 06/02/21 0544  PROT 6.8  ALBUMIN 4.1  AST 28  ALT 46*  ALKPHOS 80  BILITOT 0.9   Lipids  Recent Labs  Lab 06/02/21 0544  CHOL 179  TRIG 255*  HDL 26*  LDLCALC 102*  CHOLHDL 6.9    Hematology Recent Labs  Lab 06/01/21 2016 06/02/21 0544  WBC 9.7 8.7  RBC 4.81 4.62  HGB  15.8 15.1  HCT 45.4 43.5  MCV 94.4 94.2  MCH 32.8 32.7  MCHC 34.8 34.7  RDW 12.7 12.8  PLT 233 174    DDimer  Recent Labs  Lab 06/01/21 2016  DDIMER <0.27     Radiology/Studies:  [facility] Chest Port 1 View  Result Date: 06/01/2021 CLINICAL DATA:  Chest pain. EXAM: PORTABLE CHEST 1 VIEW COMPARISON:  Chest x-ray 12/07/2020. FINDINGS: The heart size and mediastinal contours are within normal limits. Both lungs are clear. The visualized skeletal structures are unremarkable. IMPRESSION: No active disease. Electronically Signed   By: Ronney Asters M.D.   On: 06/01/2021 20:52     Assessment and Plan:   1. Chest Pain/Dyspnea on Exertion Concerning for Accelerating Angina - He reports worsening dyspnea on exertion and exercise intolerance for the past week and is now  becoming short of breath with walking less than 20 feet and associated chest pain. Pain-free at this time.  - Hs Troponin values have been negative and EKG is without acute ST changes. D-dimer negative. Echocardiogram pending.  - He does have a history of mild nonobstructive CAD and likely microvascular ischemia by cath in 2009 but no recent ischemic testing since 2014. Dr. Domenic Polite reviewed options with the patient and will plan to proceed with a cardiac catheterization for definitive evaluation pending improvement in his renal function. Will add to the cath board for tomorrow. He does have a contrast allergy and will pre-medicate with Prednisone and Benadryl. The patient understands that risks include but are not limited to stroke (1 in 1000), death (1 in 37), kidney failure [usually temporary] (1 in 500), bleeding (1 in 200), allergic reaction [possibly serious] (1 in 200).   - Continue ASA 81mg  daily. Will add Crestor 20mg  daily. No BB for now due to baseline bradycardia.  2. HTN - His BP has been well-controlled since admission, at 129/75 on most recent check. He did receive Losartan this AM but will hold future doses in anticipation of his catheterization.   3. Nausea/Vomiting - Unclear etiology. Pending cardiac work-up as outlined above but symptoms are not associated with exertion. He is receiving PRN Zofran with improvement. Further management per the admitting team.   4. AKI - Likely due to dehydration in the setting of decreased PO intake. He is receiving IVF at 100 mL/hr and will continue throughout the day as no evidence of volume overload by examination. Creatinine was at 1.63 on admission and improved to 1.54 this AM. Repeat BMET in AM.   5. Difficulty Sleeping - Says he did not sleep well last night and requests Ambien for tonight as he has utilized this at home and tolerated well.   Risk Assessment/Risk Scores:     HEAR Score (for undifferentiated chest pain):  HEAR Score:  5   For questions or updates, please contact [place] Please consult [email or web address] for contact info under    Signed, Erma Heritage, PA-C  06/02/2021 10:21 AM    Attending note:  Patient seen and examined.  I reviewed his records and discussed the case with Ms. Delano Metz, I agree with her above findings.  Mr. Aquilino presents with a history of mild nonobstructive CAD and suspected microvascular angina at cardiac catheterization in 2009 (low risk Myoview 2014).  He also has GERD and history of chronic back pain.  He states that he has had recent shortness of breath with activity, NYHA class III over the last 3 days.  At baseline he was exercising 3  days a week on a treadmill and felt well.  He has also been experiencing intermittent epigastric and left arm discomfort with activity, worsening reflux symptoms, and musculoskeletal sounding back discomfort.  In addition has had nausea and emesis as well as some diarrhea, no fevers or chills or other sick contacts.  He reports recurrent symptoms just moving about in the room.  On examination this morning he appears comfortable.  He is afebrile, heart rate is in the 60s in sinus rhythm by telemetry which I personally reviewed.  Blood pressure 129/75.  Lungs are clear.  Cardiac exam with RRR no gallop or rub.  Significant cardiac murmur.  Pertinent lab work includes potassium 3.7, BUN 20, creatinine 1.54 down from 1.63, AST 28, ALT 46, high-sensitivity troponin I levels normal, LDL 102, hemoglobin 15.1, platelets 174, D-dimer normal.  Chest x-ray reports no acute process.  I personally reviewed his ECG which shows normal sinus rhythm, nondiagnostic inferolateral Q waves which are old.  Patient presents with symptoms concerning for unstable angina although with mixed features as discussed above.  High-sensitivity troponin I levels are normal and ECG stable.  He reports recurrent chest discomfort and shortness of breath with minimal  activity at this point even walking around the room.  Last ischemic assessment was in 2014.  We discussed the risks and benefits of a follow-up diagnostic cardiac catheterization and he is in agreement to proceed.  This will be scheduled tomorrow at [facility] following hydration and reassessment of creatinine (possibly acute renal insufficiency with recent reported nausea and diarrhea).  Would also obtain a follow-up echocardiogram.  Satira Sark, M.D., F.A.C.C.

## 2021-06-02 NOTE — Progress Notes (Signed)
*  PRELIMINARY RESULTS* Echocardiogram 2D Echocardiogram has been performed.  Andrew Love 06/02/2021, 12:12 PM

## 2021-06-02 NOTE — Progress Notes (Signed)
[facility] notification completed: Your notification ID is: [phone]   Ihor Gully, LCSW

## 2021-06-02 NOTE — H&P (View-Only) (Signed)
Cardiology Consultation:   Patient ID: Andrew Love MRN: [id number]; DOB: 19-Apr-1966  Admit date: 06/01/2021 Date of Consult: 06/02/2021  PCP:  [facility], [facility] Cardiologist: Previously followed by Dr. Lattie Haw in 2013 but no recent follow-up  Patient Profile:   Andrew Love is a 56 y.o. male with a hx of CAD (s/p cath in 2009 showing 30% RCA stenosis and likely microvascular angina, low-risk NST in 2014), chronic back pain, HTN, hypothyroidism, diverticulosis and GERD who is being seen 06/02/2021 for the evaluation of chest pain at the request of Dr. Josephine Cables.  History of Present Illness:   Mr. Bollman reports a variety of symptoms today including known back pain along with worsening nausea and vomiting for the past several days. Reports decreased PO intake during this time. His biggest concern which made him come to the ED yesterday was progressive dyspnea on exertion for the past 3-4 days. Says he usually walks on the treadmill several days a week but for the past few days, he is getting short of breath with minimal activity such as walking around in his home or in his yard to check on his dogs. Reports associated shooting pain when this occurs which can radiate into his left arm or neck. No recent orthopnea, PND or pitting edema.   Initial labs show WBC 9.7, Hgb 15.8, platelets 233, Na+ 138, K+ 4.0 and creatinine 1.63 (no recent values for comparison but was at 1.19 in 07/2019). AST 28 and ALT 46. D-dimer negative. Initial and repeat Hs Troponin negative at 3 and 4. Hgb A1c pending. FLP shows total cholesterol of 179, HDL 26, triglycerides 255 and LDL 102. CXR with no acute findings. EKG shows NSR, HR 76 with no acute ST changes.   This morning, he reports feeling comfortable at rest but was short of breath when walking around him room earlier today and also reports shooting pains in his chest at that time. Still with intermittent nausea and just  received Zofran.    Past Medical History:  Diagnosis Date   Arthritis    CAD (coronary artery disease)    Minor disease with suspected microvascular angina 2009 - [facility]; negative Myoview 2014   Chronic back pain    Chronic left hip pain    Colitis    Per medical history from dated 06/13/10.   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   Sigmoid colon ulcer    Rectal polyps   Sleep apnea with use of continuous positive airway pressure (CPAP)    Urticaria     Past Surgical History:  Procedure Laterality Date   APPENDECTOMY     8th grade   BACK SURGERY     CARDIAC CATHETERIZATION  2009   CARDIAC CATHETERIZATION     Per medical history from dated 06/13/10.   CERVICAL SPINE SURGERY     C4, C5, C6 spinal fusion   CHOLECYSTECTOMY N/A 08/31/2015   Procedure: LAPAROSCOPIC CHOLECYSTECTOMY WITH INTRAOPERATIVE CHOLANGIOGRAM;  Surgeon: Excell Seltzer, MD;  Location: [facility];  Service: General;  Laterality: N/A;   COLONOSCOPY  07/2008   Colitis,NSAID v. Ischemia,malrotation of the gut,Diverticulosis(L),hyperplastic   COLONOSCOPY  N/A 12/13/2012   Dr. Oneida Alar: Normal TI, mild sigmoid diverticulosis, hemorrhoids, 2 polyps (tubular adenoma). Random colon bx negative. Next TCS 12/2022 with Fentanyl/phenergan   ESOPHAGOGASTRODUODENOSCOPY N/A 04/16/2014   RMR: Erosive reflux esophagitis. Non critical Schzki's ring not manipulated. Small hiatal hernia. Abnormal gastirc mucosa of doubtful signigicance status post biopsy. I suspect trivial upper GI bleed. Recent abdominal pain presumably secondary to a protracted bout of diverticulitis. CT scan November 23 revealed improvemetn without complication. He finished his antibiotics 2 days age.  Left sided ab   JOINT REPLACEMENT     LAPAROSCOPIC SIGMOID COLECTOMY  2012   Dr. Fanny Skates: recurrent sigmoid diverticulitis   LAPAROSCOPIC SIGMOID COLECTOMY  2012   recurernt sigmoid diverticulitis, Dr. Irish Elders CUFF REPAIR     Left - per medical history form dated 06/13/10.   SHOULDER SURGERY     Left   THYROIDECTOMY     Per medical history form dated 06/13/10.   TOTAL HIP ARTHROPLASTY  07/04/2011   Procedure: TOTAL HIP ARTHROPLASTY;  Surgeon: Johnny Bridge, MD;  Location: [facility];  Service: Orthopedics;  Laterality: Right;   TOTAL HIP ARTHROPLASTY Left 07/22/2019   Procedure: TOTAL HIP ARTHROPLASTY;  Surgeon: Marchia Bond, MD;  Location: [facility];  Service: Orthopedics;  Laterality: Left;     Home Medications:  Prior to Admission medications   Medication Sig Start Date End Date Taking? Authorizing Provider  acetaminophen (TYLENOL) 500 MG tablet Take 500-1,000 mg by mouth every 6 (six) hours as needed for mild pain or fever.   Yes [provider]  baclofen (LIORESAL) 10 MG tablet Take 1 tablet (10 mg total) by mouth 3 (three) times daily. As needed for muscle spasm 07/23/19  Yes Merlene Pulling K, PA-C  doxepin (SINEQUAN) 10 MG capsule Take 1 capsule by mouth at bedtime as needed. 04/08/21  Yes [provider]  EPINEPHrine (EPIPEN) 0.3 mg/0.3 mL SOAJ Inject 0.3 mLs (0.3 mg total) into the muscle as needed. Patient taking differently: Inject 0.3 mg into the muscle as needed for anaphylaxis. 12/01/12  Yes Teressa Lower, MD  escitalopram (LEXAPRO) 10 MG tablet Take 10 mg by mouth daily. 09/14/20  Yes [provider]  gabapentin (NEURONTIN) 300 MG capsule Take 1 capsule by mouth at bedtime. 12/17/20  Yes [provider]  ibuprofen (ADVIL) 200 MG tablet Take 1,000 mg by mouth every 6 (six) hours as needed.   Yes [provider]  levothyroxine (SYNTHROID, LEVOTHROID) 300 MCG tablet Take 300 mcg by mouth every morning.  03/26/14  Yes [provider]  losartan (COZAAR) 100 MG tablet Take 100 mg by mouth daily. 09/26/20  Yes [provider]  meclizine (ANTIVERT) 25 MG tablet Take 1 tablet (25 mg total) by mouth 3 (three) times daily as needed for dizziness. 04/09/17  Yes Isla Pence, MD  naloxone [facility]) nasal spray 4 mg/0.1 mL SPRAY 1 SPRAY INTO ONE NOSTRIL AS DIRECTED FOR OPIOID OVERDOSE (TURN PERSON ON SIDE AFTER DOSE. IF NO RESPONSE IN 2-3 MINUTES OR PERSON RESPONDS BUT RELAPSES, REPEAT USING A NEW SPRAY DEVICE AND SPRAY INTO THE OTHER NOSTRIL. CALL 911 AFTER USE.) * EMERGENCY USE ONLY * 06/19/20  Yes [provider]  ondansetron (ZOFRAN) 4 MG tablet Take 1 tablet (4 mg total) by mouth every 6 (six) hours. 01/04/20  Yes Noemi Chapel, MD  pantoprazole (PROTONIX) 40 MG tablet Take 40 mg by mouth daily. 06/19/20  Yes [provider]  promethazine (PHENERGAN) 25 MG tablet Take  1 tablet (25 mg total) by mouth every 6 (six) hours as needed for nausea or vomiting. 04/10/19  Yes Jacqlyn Larsen, PA-C  dexlansoprazole (DEXILANT) 60 MG capsule Take 1 capsule (60 mg total) by mouth daily before breakfast. Patient not taking: Reported on 06/01/2021 04/18/19   Mahala Menghini, PA-C  dicyclomine (BENTYL) 10 MG capsule Take 1 capsule (10 mg total) by mouth 4 (four) times daily -  before meals and at bedtime. As needed for loose stool, abdominal cramping. Hold if constipation. Patient not taking: Reported on 06/01/2021 04/18/19   Mahala Menghini, PA-C  fluticasone [facility]) 50 MCG/ACT nasal spray Place 2 sprays into both nostrils daily. Patient not taking: Reported on 06/01/2021 02/26/19   Wurst, [place], PA-C  HYDROmorphone (DILAUDID) 2 MG tablet Take 1 tablet (2 mg total) by mouth every 4 (four) hours as needed for severe pain. Patient not taking: Reported on 08/06/2019 07/23/19   Ventura Bruns, PA-C  methylPREDNISolone (MEDROL DOSEPAK) 4 MG TBPK tablet Take taper as prescribed Patient not taking: Reported on 06/01/2021  04/11/20   Kinnie Feil, PA-C  sennosides-docusate sodium (SENOKOT-S) 8.6-50 MG tablet Take 2 tablets by mouth daily. Patient not taking: Reported on 08/06/2019 07/23/19   Ventura Bruns, PA-C    Inpatient Medications: Scheduled Meds:  alum & mag hydroxide-simeth  30 mL Oral Once   And   lidocaine  15 mL Oral Once   aspirin EC  81 mg Oral Daily   [START ON 06/03/2021] diphenhydrAMINE  50 mg Oral Once   Or   [START ON 06/03/2021] diphenhydrAMINE  50 mg Intravenous Once   enoxaparin (LOVENOX) injection  40 mg Subcutaneous Q24H   gabapentin  300 mg Oral QHS   levothyroxine  300 mcg Oral q morning   pantoprazole  40 mg Oral Daily   predniSONE  50 mg Oral Q6H   rosuvastatin  20 mg Oral QHS   sodium chloride flush  3 mL Intravenous Q12H   Continuous Infusions:  sodium chloride 100 mL/hr at 06/02/21 0401   famotidine Stopped (06/01/21 2232)   PRN Meds: acetaminophen, baclofen, nitroGLYCERIN, ondansetron (ZOFRAN) IV, zolpidem  Allergies:    Allergies  Allergen Reactions   Dust Mite Mixed Allergen Ext [Mite (D. Farinae)] Anaphylaxis and Hives   Other Hives and Shortness Of Breath    Cockroaches   Iohexol Hives   Almond (Diagnostic) Hives   Oysters [employer] [facility]] Hives   Wheat Bran Hives   Yeast-Related Products Hives    Social History:   Social History   Tobacco Use   Smoking status: Former    Packs/day: 0.50    Years: 18.00    Pack years: 9.00    Types: Cigarettes    Quit date: 02/15/2019    Years since quitting: 2.2   Smokeless tobacco: Never   Tobacco comments:       Substance Use Topics   Alcohol use: No    Alcohol/week: 0.0 standard drinks     Family History:    Family History  Problem Relation Age of Onset   Cancer Mother        breast cancer - per medical history form dated 06/13/10.   Diverticulitis Mother    Hypertension Mother    Cancer Father        skin - per medical history form dated 06/13/10.   Hypertension Father    Anesthesia problems  Neg Hx    Hypotension Neg Hx    Malignant hyperthermia Neg Hx  Pseudochol deficiency Neg Hx    Colon cancer Neg Hx      ROS:  Please see the history of present illness.  All other ROS reviewed and negative.     Physical Exam/Data:   Vitals:   06/02/21 0137 06/02/21 0201 06/02/21 0418 06/02/21 0948  BP:  134/81 116/73 129/75  Pulse:  65 65 65  Resp:  18 18 20   Temp: 97.9 F (36.6 C) 97.8 F (36.6 C) 98 F (36.7 C) 98 F (36.7 C)  TempSrc: Oral Oral Oral Oral  SpO2:  97% 94% 95%  Weight:  108 kg    Height:  6' (1.829 m)      Intake/Output Summary (Last 24 hours) at 06/02/2021 1021 Last data filed at 06/02/2021 6378 Gross per 24 hour  Intake 268.11 ml  Output --  Net 268.11 ml   Last 3 Weights 06/02/2021 06/01/2021 03/23/2021  Weight (lbs) 238 lb 235 lb 234 lb  Weight (kg) 107.956 kg 106.595 kg 106.142 kg     Body mass index is 32.28 kg/m.  General:  Well nourished, well developed male appearing in no acute distress HEENT: normal Neck: no JVD Vascular: No carotid bruits; Distal pulses 2+ bilaterally Cardiac:  normal S1, S2; RRR; no murmur. Lungs:  clear to auscultation bilaterally, no wheezing, rhonchi or rales  Abd: soft, nontender, no hepatomegaly  Ext: no pitting edema Musculoskeletal:  No deformities, BUE and BLE strength normal and equal Skin: warm and dry  Neuro:  CNs 2-12 intact, no focal abnormalities noted Psych:  Normal affect   EKG:  The EKG was personally reviewed and demonstrates: NSR, HR 76 with no acute ST changes.  Telemetry:  Telemetry was personally reviewed and demonstrates:  NSR, HR in 50's to 70's.   Relevant CV Studies:  NST: 09/2012 Impression Exercise Capacity:  Lexiscan with no exercise. BP Response:  Normal blood pressure response. Clinical Symptoms:  No significant symptoms noted. ECG Impression:  No significant ECG changes with Lexiscan. Comparison with Prior Nuclear Study: No significant change from previous study in 2009    Overall Impression:  Normal stress nuclear study.   LV Wall Motion:  NL LV Function; NL Wall Motion; EF 55%  Echocardiogram: 03/2014 Study Conclusions   - Left ventricle: The cavity size was normal. There was mild    concentric hypertrophy. Systolic function was normal. The    estimated ejection fraction was in the range of 60% to 65%. Wall    motion was normal; there were no regional wall motion    abnormalities.  - Mitral valve: There was trivial regurgitation.   Laboratory Data:  High Sensitivity Troponin:   Recent Labs  Lab 06/01/21 2016 06/01/21 2208  TROPONINIHS 4 3     Chemistry Recent Labs  Lab 06/01/21 2016 06/02/21 0544  NA 138 139  K 4.0 3.7  CL 105 105  CO2 27 24  GLUCOSE 119* 120*  BUN 20 20  CREATININE 1.63* 1.54*  CALCIUM 8.7* 8.9  MG  --  2.5*  GFRNONAA 50* 53*  ANIONGAP 6 10    Recent Labs  Lab 06/02/21 0544  PROT 6.8  ALBUMIN 4.1  AST 28  ALT 46*  ALKPHOS 80  BILITOT 0.9   Lipids  Recent Labs  Lab 06/02/21 0544  CHOL 179  TRIG 255*  HDL 26*  LDLCALC 102*  CHOLHDL 6.9    Hematology Recent Labs  Lab 06/01/21 2016 06/02/21 0544  WBC 9.7 8.7  RBC 4.81 4.62  HGB  15.8 15.1  HCT 45.4 43.5  MCV 94.4 94.2  MCH 32.8 32.7  MCHC 34.8 34.7  RDW 12.7 12.8  PLT 233 174    DDimer  Recent Labs  Lab 06/01/21 2016  DDIMER <0.27     Radiology/Studies:  [facility] Chest Port 1 View  Result Date: 06/01/2021 CLINICAL DATA:  Chest pain. EXAM: PORTABLE CHEST 1 VIEW COMPARISON:  Chest x-ray 12/07/2020. FINDINGS: The heart size and mediastinal contours are within normal limits. Both lungs are clear. The visualized skeletal structures are unremarkable. IMPRESSION: No active disease. Electronically Signed   By: Ronney Asters M.D.   On: 06/01/2021 20:52     Assessment and Plan:   1. Chest Pain/Dyspnea on Exertion Concerning for Accelerating Angina - He reports worsening dyspnea on exertion and exercise intolerance for the past week and is now  becoming short of breath with walking less than 20 feet and associated chest pain. Pain-free at this time.  - Hs Troponin values have been negative and EKG is without acute ST changes. D-dimer negative. Echocardiogram pending.  - He does have a history of mild nonobstructive CAD and likely microvascular ischemia by cath in 2009 but no recent ischemic testing since 2014. Dr. Domenic Polite reviewed options with the patient and will plan to proceed with a cardiac catheterization for definitive evaluation pending improvement in his renal function. Will add to the cath board for tomorrow. He does have a contrast allergy and will pre-medicate with Prednisone and Benadryl. The patient understands that risks include but are not limited to stroke (1 in 1000), death (1 in 82), kidney failure [usually temporary] (1 in 500), bleeding (1 in 200), allergic reaction [possibly serious] (1 in 200).   - Continue ASA 81mg  daily. Will add Crestor 20mg  daily. No BB for now due to baseline bradycardia.  2. HTN - His BP has been well-controlled since admission, at 129/75 on most recent check. He did receive Losartan this AM but will hold future doses in anticipation of his catheterization.   3. Nausea/Vomiting - Unclear etiology. Pending cardiac work-up as outlined above but symptoms are not associated with exertion. He is receiving PRN Zofran with improvement. Further management per the admitting team.   4. AKI - Likely due to dehydration in the setting of decreased PO intake. He is receiving IVF at 100 mL/hr and will continue throughout the day as no evidence of volume overload by examination. Creatinine was at 1.63 on admission and improved to 1.54 this AM. Repeat BMET in AM.   5. Difficulty Sleeping - Says he did not sleep well last night and requests Ambien for tonight as he has utilized this at home and tolerated well.   Risk Assessment/Risk Scores:     HEAR Score (for undifferentiated chest pain):  HEAR Score:  5   For questions or updates, please contact [employer] Please consult [email or web address] for contact info under    Signed, Erma Heritage, PA-C  06/02/2021 10:21 AM    Attending note:  Patient seen and examined.  I reviewed his records and discussed the case with Ms. Delano Metz, I agree with her above findings.  Mr. Spagnoli presents with a history of mild nonobstructive CAD and suspected microvascular angina at cardiac catheterization in 2009 (low risk Myoview 2014).  He also has GERD and history of chronic back pain.  He states that he has had recent shortness of breath with activity, NYHA class III over the last 3 days.  At baseline he was exercising 3  days a week on a treadmill and felt well.  He has also been experiencing intermittent epigastric and left arm discomfort with activity, worsening reflux symptoms, and musculoskeletal sounding back discomfort.  In addition has had nausea and emesis as well as some diarrhea, no fevers or chills or other sick contacts.  He reports recurrent symptoms just moving about in the room.  On examination this morning he appears comfortable.  He is afebrile, heart rate is in the 60s in sinus rhythm by telemetry which I personally reviewed.  Blood pressure 129/75.  Lungs are clear.  Cardiac exam with RRR no gallop or rub.  Significant cardiac murmur.  Pertinent lab work includes potassium 3.7, BUN 20, creatinine 1.54 down from 1.63, AST 28, ALT 46, high-sensitivity troponin I levels normal, LDL 102, hemoglobin 15.1, platelets 174, D-dimer normal.  Chest x-ray reports no acute process.  I personally reviewed his ECG which shows normal sinus rhythm, nondiagnostic inferolateral Q waves which are old.  Patient presents with symptoms concerning for unstable angina although with mixed features as discussed above.  High-sensitivity troponin I levels are normal and ECG stable.  He reports recurrent chest discomfort and shortness of breath with minimal  activity at this point even walking around the room.  Last ischemic assessment was in 2014.  We discussed the risks and benefits of a follow-up diagnostic cardiac catheterization and he is in agreement to proceed.  This will be scheduled tomorrow at [facility] following hydration and reassessment of creatinine (possibly acute renal insufficiency with recent reported nausea and diarrhea).  Would also obtain a follow-up echocardiogram.  Satira Sark, M.D., F.A.C.C.

## 2021-06-02 NOTE — Progress Notes (Signed)
[name] is a 56 y.o. male with medical history significant for CAD, GERD, hypothyroidism,obesity, chronic back pain who presents to the emergency department due to 1 week onset of left-sided chest pain.  He was admitted for further evaluation of chest pain as he had some concerning history with prior history of CAD.  His EKG showed normal sinus rhythm and troponins were negative.  He continues to have some intermittent left-sided chest pain and has been seen by cardiology with recommendation to pursue cardiac catheterization on 1/27.  He will remain n.p.o. after midnight in anticipation of this procedure.  He is also noted to have AKI for which she will remain on gentle IV fluid hydration with repeat labs in a.m.  Further recommendations per cardiology appreciated.  Patient has been seen and evaluated at bedside with no family members present.  He has been admitted after midnight.  Anticipate transfer to [facility] for cardiac catheterization in a.m.  Total care time: 30 minutes.

## 2021-06-02 NOTE — H&P (Signed)
History and Physical  Andrew Love NID:[id number] DOB: Feb 28, 1966 DOA: 06/01/2021  Referring physician:  Merryl Hacker, MD PCP: [facility], [facility]  Patient coming from: Home  Chief Complaint: Chest pain  HPI: Andrew Love is a 56 y.o. male with medical history significant for CAD, GERD, hypothyroidism,obesity, chronic back pain who presents to the emergency department due to 1 week onset of left-sided chest pain.  Patient states that he usually exercise about 3 times per week and each time, he walks 1.5 to 2 miles on a treadmill, he complained of having left-sided chest pain which was described as sharp and burning with radiation to left arm and left side of neck, chest pain was nonreproducible and was of moderate intensity, it was associated with weakness and nausea.  About 3 days ago, he started to notice increased shortness of breath with minimal exertion which continues to persist, so he decided to go to the ED for further evaluation and management.  Patient also endorsed a midsternal reproducible chest pain, as well as back pain and neck pain. Patient states that he had cardiac catheterization done several years ago that did not require stenting.  He denies fever, chills, diaphoresis, abdominal pain, diarrhea or constipation.  ED Course:  In the emergency department, he was hemodynamically stable.  Work-up in the ED showed normal CBC and BMP except for creatinine of 1.63 (baseline creatinine at 1.0-1.2), troponin x2 was negative, D-dimer < 0.27.  Influenza A, B, SARS coronavirus 2 was negative. Chest x-ray showed no active disease Aspirin 325 mg x 1 was given, nitroglycerin will sublingual was given with improved chest pain.  Reglan was given.  Hospitalist was asked to admit patient for further evaluation and management.  Review of Systems: A full 10 point Review of Systems was done, except as stated above, all other Review of systems were negative.   Past Medical History:   Diagnosis Date   Arthritis    Back pain    chronic   Bronchitis    history of   CAD (coronary artery disease)    Chronic back pain    Chronic insomnia    Per medical history form dated 06/13/10.   Chronic left hip pain    Colitis    Per medical history from dated 06/13/10.   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   Sigmoid colon ulcer    Rectal polyps   Sleep apnea with use of continuous positive airway pressure (CPAP)    Urticaria    Past Surgical History:  Procedure Laterality Date   APPENDECTOMY     8th grade   BACK SURGERY     CARDIAC CATHETERIZATION  2009   CARDIAC CATHETERIZATION     Per medical history from dated 06/13/10.   CERVICAL SPINE SURGERY     C4, C5, C6 spinal fusion   CHOLECYSTECTOMY N/A 08/31/2015   Procedure: LAPAROSCOPIC CHOLECYSTECTOMY WITH INTRAOPERATIVE CHOLANGIOGRAM;  Surgeon: Excell Seltzer, MD;  Location: [facility];  Service: General;  Laterality: N/A;   COLONOSCOPY  07/2008   Colitis,NSAID v. Ischemia,malrotation of the gut,Diverticulosis(L),hyperplastic   COLONOSCOPY N/A 12/13/2012   Dr. Oneida Alar: Normal TI, mild sigmoid diverticulosis,  hemorrhoids, 2 polyps (tubular adenoma). Random colon bx negative. Next TCS 12/2022 with Fentanyl/phenergan   ESOPHAGOGASTRODUODENOSCOPY N/A 04/16/2014   RMR: Erosive reflux esophagitis. Non critical Schzki's ring not manipulated. Small hiatal hernia. Abnormal gastirc mucosa of doubtful signigicance status post biopsy. I suspect trivial upper GI bleed. Recent abdominal pain presumably secondary to a protracted bout of diverticulitis. CT scan November 23 revealed improvemetn without complication. He finished his antibiotics 2 days age. Left sided ab    JOINT REPLACEMENT     LAPAROSCOPIC SIGMOID COLECTOMY  2012   Dr. Fanny Skates: recurrent sigmoid diverticulitis   LAPAROSCOPIC SIGMOID COLECTOMY  2012   recurernt sigmoid diverticulitis, Dr. Irish Elders CUFF REPAIR     Left - per medical history form dated 06/13/10.   SHOULDER SURGERY     Left   THYROIDECTOMY     Per medical history form dated 06/13/10.   TOTAL HIP ARTHROPLASTY  07/04/2011   Procedure: TOTAL HIP ARTHROPLASTY;  Surgeon: Johnny Bridge, MD;  Location: [facility];  Service: Orthopedics;  Laterality: Right;   TOTAL HIP ARTHROPLASTY Left 07/22/2019   Procedure: TOTAL HIP ARTHROPLASTY;  Surgeon: Marchia Bond, MD;  Location: [facility];  Service: Orthopedics;  Laterality: Left;    Social History:  reports that he quit smoking about 2 years ago. His smoking use included cigarettes. He has a 9.00 pack-year smoking history. He has never used smokeless tobacco. He reports that he does not drink alcohol and does not use drugs.   Allergies  Allergen Reactions   Dust Mite Mixed Allergen Ext [Mite (D. Farinae)] Anaphylaxis and Hives   Other Hives and Shortness Of Breath    Cockroaches   Iohexol Hives   Almond (Diagnostic) Hives   Oysters [Shellfish Allergy] Hives   Wheat Bran Hives   Yeast-Related Products Hives    Family History  Problem Relation Age of Onset   Cancer Mother        breast cancer - per medical history form dated 06/13/10.   Diverticulitis Mother    Hypertension Mother    Cancer Father        skin - per medical history form dated 06/13/10.   Hypertension Father    Anesthesia problems Neg Hx    Hypotension Neg Hx    Malignant hyperthermia Neg Hx    Pseudochol deficiency Neg Hx    Colon cancer Neg Hx      Prior to Admission medications   Medication Sig Start Date End Date Taking? Authorizing Provider  acetaminophen (TYLENOL) 500 MG tablet Take 500-1,000 mg by mouth every 6 (six) hours as needed for mild pain or fever.   Yes [provider]  baclofen  (LIORESAL) 10 MG tablet Take 1 tablet (10 mg total) by mouth 3 (three) times daily. As needed for muscle spasm 07/23/19  Yes Merlene Pulling K, PA-C  doxepin (SINEQUAN) 10 MG capsule Take 1 capsule by mouth at bedtime as needed. 04/08/21  Yes [provider]  EPINEPHrine (EPIPEN) 0.3 mg/0.3 mL SOAJ Inject 0.3 mLs (0.3 mg total) into the muscle as needed. Patient taking differently: Inject 0.3 mg into the muscle as needed for anaphylaxis. 12/01/12  Yes Teressa Lower, MD  escitalopram (LEXAPRO) 10 MG tablet Take 10 mg by mouth daily. 09/14/20  Yes [provider]  gabapentin (NEURONTIN) 300 MG capsule Take 1 capsule by mouth at bedtime. 12/17/20  Yes [provider]  ibuprofen (ADVIL) 200 MG tablet Take 1,000 mg by mouth every 6 (six) hours  as needed.   Yes [provider]  levothyroxine (SYNTHROID, LEVOTHROID) 300 MCG tablet Take 300 mcg by mouth every morning.  03/26/14  Yes [provider]  losartan (COZAAR) 100 MG tablet Take 100 mg by mouth daily. 09/26/20  Yes [provider]  meclizine (ANTIVERT) 25 MG tablet Take 1 tablet (25 mg total) by mouth 3 (three) times daily as needed for dizziness. 04/09/17  Yes Isla Pence, MD  naloxone [facility]) nasal spray 4 mg/0.1 mL SPRAY 1 SPRAY INTO ONE NOSTRIL AS DIRECTED FOR OPIOID OVERDOSE (TURN PERSON ON SIDE AFTER DOSE. IF NO RESPONSE IN 2-3 MINUTES OR PERSON RESPONDS BUT RELAPSES, REPEAT USING A NEW SPRAY DEVICE AND SPRAY INTO THE OTHER NOSTRIL. CALL 911 AFTER USE.) * EMERGENCY USE ONLY * 06/19/20  Yes [provider]  ondansetron (ZOFRAN) 4 MG tablet Take 1 tablet (4 mg total) by mouth every 6 (six) hours. 01/04/20  Yes Noemi Chapel, MD  pantoprazole (PROTONIX) 40 MG tablet Take 40 mg by mouth daily. 06/19/20  Yes [provider]  promethazine (PHENERGAN) 25 MG tablet Take 1 tablet (25 mg total) by mouth every 6 (six) hours as needed for nausea or vomiting. 04/10/19  Yes Jacqlyn Larsen, PA-C   dexlansoprazole (DEXILANT) 60 MG capsule Take 1 capsule (60 mg total) by mouth daily before breakfast. Patient not taking: Reported on 06/01/2021 04/18/19   Mahala Menghini, PA-C  dicyclomine (BENTYL) 10 MG capsule Take 1 capsule (10 mg total) by mouth 4 (four) times daily -  before meals and at bedtime. As needed for loose stool, abdominal cramping. Hold if constipation. Patient not taking: Reported on 06/01/2021 04/18/19   Mahala Menghini, PA-C  fluticasone [facility]) 50 MCG/ACT nasal spray Place 2 sprays into both nostrils daily. Patient not taking: Reported on 06/01/2021 02/26/19   Wurst, [place], PA-C  HYDROmorphone (DILAUDID) 2 MG tablet Take 1 tablet (2 mg total) by mouth every 4 (four) hours as needed for severe pain. Patient not taking: Reported on 08/06/2019 07/23/19   Ventura Bruns, PA-C  methylPREDNISolone (MEDROL DOSEPAK) 4 MG TBPK tablet Take taper as prescribed Patient not taking: Reported on 06/01/2021 04/11/20   Kinnie Feil, PA-C  sennosides-docusate sodium (SENOKOT-S) 8.6-50 MG tablet Take 2 tablets by mouth daily. Patient not taking: Reported on 08/06/2019 07/23/19   Ventura Bruns, PA-C    Physical Exam: BP 134/81 (BP Location: Right Arm)    Pulse 65    Temp 97.8 F (36.6 C) (Oral)    Resp 18    Ht 6' (1.829 m)    Wt 108 kg    SpO2 97%    BMI 32.28 kg/m   General: 56 y.o. year-old male well developed well nourished in no acute distress.  Alert and oriented x3. HEENT: NCAT, EOMI Neck: Supple, trachea medial Cardiovascular: Regular rate and rhythm with no rubs or gallops.  No thyromegaly or JVD noted.  No lower extremity edema. 2/4 pulses in all 4 extremities. Respiratory: Clear to auscultation with no wheezes or rales. Good inspiratory effort. Abdomen: Soft, nontender nondistended with normal bowel sounds x4 quadrants. Muskuloskeletal: Tender to palpation of midsternal area.  No cyanosis, clubbing or edema noted bilaterally Neuro: CN II-XII intact, strength 5/5 x 4,  sensation, reflexes intact Skin: No ulcerative lesions noted or rashes Psychiatry: Judgement and insight appear normal. Mood is appropriate for condition and setting          Labs on Admission:  Basic Metabolic Panel: Recent Labs  Lab  06/01/21 2016  NA 138  K 4.0  CL 105  CO2 27  GLUCOSE 119*  BUN 20  CREATININE 1.63*  CALCIUM 8.7*   Liver Function Tests: No results for input(s): AST, ALT, ALKPHOS, BILITOT, PROT, ALBUMIN in the last 168 hours. No results for input(s): LIPASE, AMYLASE in the last 168 hours. No results for input(s): AMMONIA in the last 168 hours. CBC: Recent Labs  Lab 06/01/21 2016  WBC 9.7  HGB 15.8  HCT 45.4  MCV 94.4  PLT 233   Cardiac Enzymes: No results for input(s): CKTOTAL, CKMB, CKMBINDEX, TROPONINI in the last 168 hours.  BNP (last 3 results) No results for input(s): BNP in the last 8760 hours.  ProBNP (last 3 results) No results for input(s): PROBNP in the last 8760 hours.  CBG: No results for input(s): GLUCAP in the last 168 hours.  Radiological Exams on Admission: DG Chest Port 1 View  Result Date: 06/01/2021 CLINICAL DATA:  Chest pain. EXAM: PORTABLE CHEST 1 VIEW COMPARISON:  Chest x-ray 12/07/2020. FINDINGS: The heart size and mediastinal contours are within normal limits. Both lungs are clear. The visualized skeletal structures are unremarkable. IMPRESSION: No active disease. Electronically Signed   By: Ronney Asters M.D.   On: 06/01/2021 20:52    EKG: I independently viewed the EKG done and my findings are as followed: Normal sinus rhythm at a rate of 76 bpm  Assessment/Plan Present on Admission:  Chest pain  Nausea without vomiting  AKI (acute kidney injury) ([place])  CAD (coronary artery disease)  Active Problems:   CAD (coronary artery disease)   Hypothyroidism   Nausea without vomiting   AKI (acute kidney injury) ([place])   Chest pain   Obesity (BMI 30.0-34.9)   Chronic back pain  Chest pain rule out ACS Continue  telemetry  Troponins x2 - 4 > 3 EKG personally reviewed showed normal sinus rhythm at a rate of 76 bpm Nitroglycerin sublingual given relieved chest pain Aspirin 325 mg x 1 was given Cardiology will be consulted to help decide if Stress test is needed in am Versus other  diagnostic modalities.    Continue aspirin 81 mg, nitroglycerin prn  Nausea without vomiting Continue Zofran as needed  Acute kidney injury BUN/creatinine of 20/1.63 (baseline creatinine at 1.0-1.2) Continue gentle hydration Renally adjust medications, avoid nephrotoxic agents/dehydration/hypotension  History of CAD Patient states that he had cardiac catheterization several years ago that did not require stent placement, however, he has never followed up with any cardiologist since then. Patient is currently not on any medication for CAD  Hypothyroidism Continue Synthroid  Essential hypertension Continue losartan  Chronic back pain Continue Tylenol, baclofen, Neurontin  Obesity (BMI 32.28 kg/) Patient states that he watches his diet and that he exercises 3 times per week   DVT prophylaxis: Lovenox  Code Status: Full code  Family Communication: Wife at bedside (all questions answered to satisfaction)  Disposition Plan:  Patient is from:                        home Anticipated DC to:                   SNF or family members home Anticipated DC date:               2-3 days Anticipated DC barriers:          Patient requires inpatient management due to chest pain pending cardiology consult  Consults called:  Cardiology  Admission status: Observation    Bernadette Hoit MD [facility]  06/02/2021, 3:27 AM

## 2021-06-02 NOTE — Progress Notes (Signed)
Verbal report given to charge nurse at [facility]. Patient left unit with all belongings and transfer papers given to spouse. Patient in stable condition , heart monitor transferred to EMS monitor for transport.

## 2021-06-03 ENCOUNTER — Inpatient Hospital Stay (HOSPITAL_COMMUNITY)
Admission: EM | Disposition: A | Discharge status: Home / Self Care | Payer: Self-pay | Source: Home / Self Care | Attending: Internal Medicine

## 2021-06-03 DIAGNOSIS — I25110 Atherosclerotic heart disease of native coronary artery with unstable angina pectoris: Secondary | ICD-10-CM

## 2021-06-03 DIAGNOSIS — R0609 Other forms of dyspnea: Secondary | ICD-10-CM

## 2021-06-03 DIAGNOSIS — I25118 Atherosclerotic heart disease of native coronary artery with other forms of angina pectoris: Secondary | ICD-10-CM

## 2021-06-03 HISTORY — PX: LEFT HEART CATH AND CORONARY ANGIOGRAPHY: CATH118249

## 2021-06-03 LAB — CBC
HCT: 41.5 % (ref 39.0–52.0)
Hemoglobin: 14.6 g/dL (ref 13.0–17.0)
MCH: 32.7 pg (ref 26.0–34.0)
MCHC: 35.2 g/dL (ref 30.0–36.0)
MCV: 92.8 fL (ref 80.0–100.0)
Platelets: 189 10*3/uL (ref 150–400)
RBC: 4.47 MIL/uL (ref 4.22–5.81)
RDW: 12.6 % (ref 11.5–15.5)
WBC: 8.3 10*3/uL (ref 4.0–10.5)
nRBC: 0 % (ref 0.0–0.2)

## 2021-06-03 LAB — BASIC METABOLIC PANEL
Anion gap: 8 (ref 5–15)
Anion gap: 7 (ref 5–15)
BUN: 16 mg/dL (ref 6–20)
BUN: 15 mg/dL (ref 6–20)
CO2: 23 mmol/L (ref 22–32)
CO2: 22 mmol/L (ref 22–32)
Calcium: 8.2 mg/dL — ABNORMAL LOW (ref 8.9–10.3)
Calcium: 8.7 mg/dL — ABNORMAL LOW (ref 8.9–10.3)
Chloride: 105 mmol/L (ref 98–111)
Chloride: 108 mmol/L (ref 98–111)
Creatinine, Ser: 1.51 mg/dL — ABNORMAL HIGH (ref 0.61–1.24)
Creatinine, Ser: 1.46 mg/dL — ABNORMAL HIGH (ref 0.61–1.24)
GFR, Estimated: 55 mL/min — ABNORMAL LOW (ref >60)
GFR, Estimated: 57 mL/min — ABNORMAL LOW (ref >60)
Glucose, Bld: 147 mg/dL — ABNORMAL HIGH (ref 70–99)
Glucose, Bld: 203 mg/dL — ABNORMAL HIGH (ref 70–99)
Potassium: 3.6 mmol/L (ref 3.5–5.1)
Potassium: 4.2 mmol/L (ref 3.5–5.1)
Sodium: 136 mmol/L (ref 135–145)
Sodium: 137 mmol/L (ref 135–145)

## 2021-06-03 LAB — HEMOGLOBIN A1C
Hgb A1c MFr Bld: 6.2 % — ABNORMAL HIGH (ref 4.8–5.6)
Mean Plasma Glucose: 131 mg/dL

## 2021-06-03 LAB — MAGNESIUM: Magnesium: 2.4 mg/dL (ref 1.7–2.4)

## 2021-06-03 SURGERY — LEFT HEART CATH AND CORONARY ANGIOGRAPHY
Anesthesia: LOCAL

## 2021-06-03 MED ORDER — FENTANYL CITRATE (PF) 100 MCG/2ML IJ SOLN
INTRAMUSCULAR | Status: DC | PRN
  Administered 2021-06-03: 25 ug via INTRAVENOUS

## 2021-06-03 MED ORDER — IOHEXOL 350 MG/ML SOLN
INTRAVENOUS | Status: DC | PRN
  Administered 2021-06-03: 45 mL

## 2021-06-03 MED ORDER — LIDOCAINE HCL (PF) 1 % IJ SOLN
INTRAMUSCULAR | Status: DC | PRN
  Administered 2021-06-03: 2 mL

## 2021-06-03 MED ORDER — VERAPAMIL HCL 2.5 MG/ML IV SOLN
INTRAVENOUS | Status: DC | PRN
  Administered 2021-06-03: 5 mg via INTRAVENOUS

## 2021-06-03 MED ORDER — MIDAZOLAM HCL 2 MG/2ML IJ SOLN
INTRAMUSCULAR | Status: DC | PRN
  Administered 2021-06-03: 1 mg via INTRAVENOUS

## 2021-06-03 MED ORDER — LIDOCAINE HCL (PF) 1 % IJ SOLN
INTRAMUSCULAR | Status: AC
  Filled 2021-06-03: qty 30

## 2021-06-03 MED ORDER — DIPHENHYDRAMINE HCL 50 MG/ML IJ SOLN
INTRAMUSCULAR | Status: AC
  Filled 2021-06-03: qty 1

## 2021-06-03 MED ORDER — FENTANYL CITRATE (PF) 100 MCG/2ML IJ SOLN
INTRAMUSCULAR | Status: AC
  Filled 2021-06-03: qty 2

## 2021-06-03 MED ORDER — MIDAZOLAM HCL 2 MG/2ML IJ SOLN
INTRAMUSCULAR | Status: AC
  Filled 2021-06-03: qty 2

## 2021-06-03 MED ORDER — HEPARIN SODIUM (PORCINE) 1000 UNIT/ML IJ SOLN
INTRAMUSCULAR | Status: DC | PRN
  Administered 2021-06-03: 5000 [IU] via INTRAVENOUS

## 2021-06-03 MED ORDER — VERAPAMIL HCL 2.5 MG/ML IV SOLN
INTRAVENOUS | Status: AC
  Filled 2021-06-03: qty 2

## 2021-06-03 MED ORDER — HEPARIN (PORCINE) IN NACL 1000-0.9 UT/500ML-% IV SOLN
INTRAVENOUS | Status: AC
  Filled 2021-06-03: qty 1000

## 2021-06-03 MED ORDER — HEPARIN (PORCINE) IN NACL 1000-0.9 UT/500ML-% IV SOLN
INTRAVENOUS | Status: DC | PRN
  Administered 2021-06-03 (×2): 500 mL

## 2021-06-03 MED ORDER — HEPARIN SODIUM (PORCINE) 1000 UNIT/ML IJ SOLN
INTRAMUSCULAR | Status: AC
  Filled 2021-06-03: qty 10

## 2021-06-03 SURGICAL SUPPLY — 12 items
CATH INFINITI 6F ANG MULTIPACK (CATHETERS) ×1 IMPLANT
CATH INFINITI 6F FL3.5 (CATHETERS) ×1 IMPLANT
CATH INFINITI JR4 5F (CATHETERS) ×1 IMPLANT
DEVICE RAD COMP TR BAND LRG (VASCULAR PRODUCTS) ×1 IMPLANT
GLIDESHEATH SLEND SS 6F .021 (SHEATH) ×1 IMPLANT
GUIDEWIRE INQWIRE 1.5J.035X260 (WIRE) IMPLANT
INQWIRE 1.5J .035X260CM (WIRE) ×2
KIT HEART LEFT (KITS) ×3 IMPLANT
PACK CARDIAC CATHETERIZATION (CUSTOM PROCEDURE TRAY) ×3 IMPLANT
TRANSDUCER W/STOPCOCK (MISCELLANEOUS) ×3 IMPLANT
TUBING CIL FLEX 10 FLL-RA (TUBING) ×3 IMPLANT
TUBING CONTRAST HIGH PRESS 48 (TUBING) ×1 IMPLANT

## 2021-06-03 NOTE — Progress Notes (Signed)
PROGRESS NOTE    [name]  FKC:[id number] DOB: 01/18/1966 DOA: 06/01/2021 PCP: [facility]    Chief Complaint  Patient presents with   Chest Pain    Brief Narrative: Andrew Love is a 56 y.o. male with medical history significant for CAD, GERD, hypothyroidism,obesity, chronic back pain who presents to the emergency department due to 1 week onset of left-sided chest pain.  He was admitted for further evaluation of chest pain as he had some concerning history with prior history of CAD.  His EKG showed normal sinus rhythm and troponins were negative.  He continues to have some intermittent left-sided chest pain and has been seen by cardiology with recommendation to pursue cardiac catheterization on 1/27.  Assessment & Plan:   Active Problems:   CAD (coronary artery disease)   Hypothyroidism   Nausea without vomiting   AKI (acute kidney injury) ([place])   Chest pain   Obesity (BMI 30.0-34.9)   Chronic back pain   Chest pain, with h/o CAD Troponins negative,  Some sob earlier this am.  Plan for cardiac cath today.  Echo showed preserved LVEF of 60 to 65% without wall motion abn.  Continue with aspirin and statin.    Hypertension:  Well controlled.    AKI;  Baseline creatinine around 1.1.  Admitted with a creatinine of 1.6 slight improvement to 1.5.  Continue with IV fluids.     Hypothyroidism:  Resume synthroid.     GERD; Stable.     Body mass index is 32.13 kg/m. Obesity:    Hyperlipidemia:  Resume statin.   DVT prophylaxis: /SCD's Code Status: (full code.  Family Communication: none at bedside.  Disposition:   Status is: Inpatient  Remains inpatient appropriate because: cath.        Consultants:  Cardiology.   Procedures: cath today.   Antimicrobials: none.    Subjective: SOME sob earlier this am on ambulation.   Objective: Vitals:   06/02/21 1315 06/02/21 2105 06/02/21 2355 06/03/21 1246  BP: 106/69 133/79  135/75   Pulse: 67 67 65   Resp: 16 18 18    Temp: 98 F (36.7 C) 98 F (36.7 C) 98 F (36.7 C)   TempSrc: Oral Oral Oral   SpO2: 95% 97% 97% 95%  Weight:  107.5 kg    Height:  6' (1.829 m)      Intake/Output Summary (Last 24 hours) at 06/03/2021 1317 Last data filed at 06/03/2021 0700 Gross per 24 hour  Intake 1686.57 ml  Output --  Net 1686.57 ml   Filed Weights   06/01/21 1944 06/02/21 0201 06/02/21 2105  Weight: 106.6 kg 108 kg 107.5 kg    Examination:  General exam: Appears calm and comfortable  Respiratory system: Clear to auscultation. Respiratory effort normal. Cardiovascular system: S1 & S2 heard, RRR. No JVD, murmurs, rubs, gallops or clicks. No pedal edema. Gastrointestinal system: Abdomen is nondistended, soft and nontender. No organomegaly or masses felt. Normal bowel sounds heard. Central nervous system: Alert and oriented. No focal neurological deficits. Extremities: Symmetric 5 x 5 power. Skin: No rashes, lesions or ulcers Psychiatry: Judgement and insight appear normal. Mood & affect appropriate.     Data Reviewed: I have personally reviewed following labs and imaging studies  CBC: Recent Labs  Lab 06/01/21 2016 06/02/21 0544 06/03/21 0140  WBC 9.7 8.7 8.3  HGB 15.8 15.1 14.6  HCT 45.4 43.5 41.5  MCV 94.4 94.2 92.8  PLT 233 174 189    Basic  Metabolic Panel: Recent Labs  Lab 06/01/21 2016 06/02/21 0544 06/03/21 0140 06/03/21 0644  NA 138 139 136 137  K 4.0 3.7 3.6 4.2  CL 105 105 105 108  CO2 27 24 23 22   GLUCOSE 119* 120* 147* 203*  BUN 20 20 16 15   CREATININE 1.63* 1.54* 1.51* 1.46*  CALCIUM 8.7* 8.9 8.2* 8.7*  MG  --  2.5* 2.4  --   PHOS  --  3.8  --   --     GFR: Estimated Creatinine Clearance: 73.3 mL/min (A) (by C-G formula based on SCr of 1.46 mg/dL (H)).  Liver Function Tests: Recent Labs  Lab 06/02/21 0544  AST 28  ALT 46*  ALKPHOS 80  BILITOT 0.9  PROT 6.8  ALBUMIN 4.1    CBG: No results for input(s):  GLUCAP in the last 168 hours.   Recent Results (from the past 240 hour(s))  Resp Panel by RT-PCR (Flu A&B, Covid) Nasopharyngeal Swab     Status: None   Collection Time: 06/01/21  8:14 PM   Specimen: Nasopharyngeal Swab; Nasopharyngeal(NP) swabs in vial transport medium  Result Value Ref Range Status   SARS Coronavirus 2 by RT PCR NEGATIVE NEGATIVE Final    Comment: (NOTE) SARS-CoV-2 target nucleic acids are NOT DETECTED.  The SARS-CoV-2 RNA is generally detectable in upper respiratory specimens during the acute phase of infection. The lowest concentration of SARS-CoV-2 viral copies this assay can detect is 138 copies/mL. A negative result does not preclude SARS-Cov-2 infection and should not be used as the sole basis for treatment or other patient management decisions. A negative result may occur with  improper specimen collection/handling, submission of specimen other than nasopharyngeal swab, presence of viral mutation(s) within the areas targeted by this assay, and inadequate number of viral copies(<138 copies/mL). A negative result must be combined with clinical observations, patient history, and epidemiological information. The expected result is Negative.  Fact Sheet for Patients:  EntrepreneurPulse.com.au  Fact Sheet for Healthcare Providers:  IncredibleEmployment.be  This test is no t yet approved or cleared by the [place] FDA and  has been authorized for detection and/or diagnosis of SARS-CoV-2 by FDA under an Emergency Use Authorization (EUA). This EUA will remain  in effect (meaning this test can be used) for the duration of the COVID-19 declaration under Section 564(b)(1) of the Act, 21 U.S.C.section 360bbb-3(b)(1), unless the authorization is terminated  or revoked sooner.       Influenza A by PCR NEGATIVE NEGATIVE Final   Influenza B by PCR NEGATIVE NEGATIVE Final    Comment: (NOTE) The Xpert Xpress SARS-CoV-2/FLU/RSV  plus assay is intended as an aid in the diagnosis of influenza from Nasopharyngeal swab specimens and should not be used as a sole basis for treatment. Nasal washings and aspirates are unacceptable for Xpert Xpress SARS-CoV-2/FLU/RSV testing.  Fact Sheet for Patients: EntrepreneurPulse.com.au  Fact Sheet for Healthcare Providers: IncredibleEmployment.be  This test is not yet approved or cleared by the [place] FDA and has been authorized for detection and/or diagnosis of SARS-CoV-2 by FDA under an Emergency Use Authorization (EUA). This EUA will remain in effect (meaning this test can be used) for the duration of the COVID-19 declaration under Section 564(b)(1) of the Act, 21 U.S.C. section 360bbb-3(b)(1), unless the authorization is terminated or revoked.  Performed at [facility], [address], [place], [place] [address]          Radiology Studies: [facility] Chest [facility] 1 View  Result Date: 06/01/2021 CLINICAL  DATA:  Chest pain. EXAM: PORTABLE CHEST 1 VIEW COMPARISON:  Chest x-ray 12/07/2020. FINDINGS: The heart size and mediastinal contours are within normal limits. Both lungs are clear. The visualized skeletal structures are unremarkable. IMPRESSION: No active disease. Electronically Signed   By: Ronney Asters M.D.   On: 06/01/2021 20:52   ECHOCARDIOGRAM COMPLETE  Result Date: 06/02/2021    ECHOCARDIOGRAM REPORT   Patient Name:   Andrew Love Date of Exam: 06/02/2021 Medical Rec #:  [id number]        Height:       72.0 in Accession #:    [id number]       Weight:       238.0 lb Date of Birth:  1966-04-10        BSA:          2.294 m Patient Age:    39 years         BP:           116/73 mmHg Patient Gender: M                HR:           62 bpm. Exam Location:  [facility] Procedure: 2D Echo, Cardiac Doppler and Color Doppler Indications:    Chest Pain  History:        Patient has prior history of Echocardiogram examinations, most                  recent 01/07/2015. CAD, Stroke, Signs/Symptoms:Chest Pain; Risk                 Factors:Hypertension and Dyslipidemia.  Sonographer:    Wenda Low Referring Phys: [id number] [place]  1. Left ventricular ejection fraction, by estimation, is 60 to 65%. The left ventricle has normal function. The left ventricle has no regional wall motion abnormalities. There is mild left ventricular hypertrophy. Left ventricular diastolic parameters are indeterminate.  2. Right ventricular systolic function is normal. The right ventricular size is normal. There is normal pulmonary artery systolic pressure. The estimated right ventricular systolic pressure is 10.1 mmHg.  3. The mitral valve is grossly normal. Trivial mitral valve regurgitation.  4. The aortic valve is tricuspid. Aortic valve regurgitation is not visualized. No aortic stenosis is present. Aortic valve mean gradient measures 3.0 mmHg.  5. The inferior vena cava is normal in size with greater than 50% respiratory variability, suggesting right atrial pressure of 3 mmHg. Comparison(s): Prior images unable to be directly viewed. FINDINGS  Left Ventricle: Left ventricular ejection fraction, by estimation, is 60 to 65%. The left ventricle has normal function. The left ventricle has no regional wall motion abnormalities. The left ventricular internal cavity size was normal in size. There is  mild left ventricular hypertrophy. Left ventricular diastolic parameters are indeterminate. Right Ventricle: The right ventricular size is normal. No increase in right ventricular wall thickness. Right ventricular systolic function is normal. There is normal pulmonary artery systolic pressure. The tricuspid regurgitant velocity is 2.06 m/s, and  with an assumed right atrial pressure of 3 mmHg, the estimated right ventricular systolic pressure is 75.1 mmHg. Left Atrium: Left atrial size was normal in size. Right Atrium: Right atrial size was normal in size.  Pericardium: There is no evidence of pericardial effusion. Mitral Valve: The mitral valve is grossly normal. Trivial mitral valve regurgitation. MV peak gradient, 4.9 mmHg. The mean mitral valve gradient is 2.0 mmHg. Tricuspid Valve: The tricuspid valve is grossly  normal. Tricuspid valve regurgitation is trivial. Aortic Valve: The aortic valve is tricuspid. Aortic valve regurgitation is not visualized. No aortic stenosis is present. Aortic valve mean gradient measures 3.0 mmHg. Aortic valve peak gradient measures 6.0 mmHg. Aortic valve area, by VTI measures 2.83 cm. Pulmonic Valve: The pulmonic valve was grossly normal. Pulmonic valve regurgitation is trivial. Aorta: The aortic root is normal in size and structure. Venous: The inferior vena cava is normal in size with greater than 50% respiratory variability, suggesting right atrial pressure of 3 mmHg. IAS/Shunts: No atrial level shunt detected by color flow Doppler.  LEFT VENTRICLE PLAX 2D LVIDd:         4.90 cm     Diastology LVIDs:         3.00 cm     LV e' medial:    7.07 cm/s LV PW:         1.20 cm     LV E/e' medial:  14.0 LV IVS:        1.40 cm     LV e' lateral:   6.85 cm/s LVOT diam:     2.00 cm     LV E/e' lateral: 14.5 LV SV:         75 LV SV Index:   33 LVOT Area:     3.14 cm  LV Volumes (MOD) LV vol d, MOD A2C: 71.7 ml LV vol d, MOD A4C: 79.3 ml LV vol s, MOD A2C: 24.9 ml LV vol s, MOD A4C: 28.3 ml LV SV MOD A2C:     46.8 ml LV SV MOD A4C:     79.3 ml LV SV MOD BP:      49.9 ml RIGHT VENTRICLE RV Basal diam:  3.20 cm RV Mid diam:    2.80 cm RV S prime:     10.00 cm/s TAPSE (M-mode): 2.1 cm LEFT ATRIUM             Index        RIGHT ATRIUM           Index LA diam:        3.60 cm 1.57 cm/m   RA Area:     18.90 cm LA Vol (A2C):   32.2 ml 14.04 ml/m  RA Volume:   53.80 ml  23.45 ml/m LA Vol (A4C):   31.0 ml 13.51 ml/m LA Biplane Vol: 33.3 ml 14.52 ml/m  AORTIC VALVE                    PULMONIC VALVE AV Area (Vmax):    2.58 cm     PV Vmax:        0.68 m/s AV Area (Vmean):   2.30 cm     PV Peak grad:  1.8 mmHg AV Area (VTI):     2.83 cm AV Vmax:           122.00 cm/s AV Vmean:          83.200 cm/s AV VTI:            0.265 m AV Peak Grad:      6.0 mmHg AV Mean Grad:      3.0 mmHg LVOT Vmax:         100.00 cm/s LVOT Vmean:        60.900 cm/s LVOT VTI:          0.239 m LVOT/AV VTI ratio: 0.90  AORTA Ao Root diam: 3.10 cm Ao Asc diam:  2.90 cm MITRAL VALVE                TRICUSPID VALVE MV Area (PHT): 3.17 cm     TR Peak grad:   17.0 mmHg MV Area VTI:   1.82 cm     TR Vmax:        206.00 cm/s MV Peak grad:  4.9 mmHg MV Mean grad:  2.0 mmHg     SHUNTS MV Vmax:       1.11 m/s     Systemic VTI:  0.24 m MV Vmean:      61.1 cm/s    Systemic Diam: 2.00 cm MV Decel Time: 239 msec MV E velocity: 99.00 cm/s MV A velocity: 122.00 cm/s MV E/A ratio:  0.81 Rozann Lesches MD Electronically signed by Rozann Lesches MD Signature Date/Time: 06/02/2021/1:32:54 PM    Final         Scheduled Meds:  [MAR Hold] alum & mag hydroxide-simeth  30 mL Oral Once   And   [MAR Hold] lidocaine  15 mL Oral Once   [MAR Hold] aspirin EC  81 mg Oral Daily   [MAR Hold] enoxaparin (LOVENOX) injection  40 mg Subcutaneous Q24H   [MAR Hold] gabapentin  300 mg Oral QHS   [MAR Hold] levothyroxine  300 mcg Oral q morning   [MAR Hold] pantoprazole  40 mg Oral Daily   predniSONE  50 mg Oral Q6H   [MAR Hold] rosuvastatin  20 mg Oral QHS   [MAR Hold] sodium chloride flush  3 mL Intravenous Q12H   [MAR Hold] sodium chloride flush  3 mL Intravenous Q12H   Continuous Infusions:  sodium chloride     sodium chloride     sodium chloride     sodium chloride 100 mL/hr at 06/03/21 0114   [MAR Hold] famotidine Stopped (06/01/21 2232)     LOS: 1 day        Hosie Poisson, MD [facility]   To contact the attending provider between 7A-7P or the covering provider during after hours 7P-7A, please log into the web site [email or web address] and access using universal [place]  password for that web site. If you do not have the password, please call the hospital operator.  06/03/2021, 1:17 PM

## 2021-06-03 NOTE — Progress Notes (Signed)
Asked to assess forearm hematoma. There is some fullness above the band but no palpible hematoma. Forearm not tender and the hand color and radial pulse are intact. Recommend demarcate area of concern and continue with deflation protocol.

## 2021-06-03 NOTE — Progress Notes (Signed)
Removed TR band from right radial. Site is a level 0. Placed gauze and Tegaderm to site. Educated pt to leave in place for 24 hrs. Pt verbalized understanding.

## 2021-06-03 NOTE — Progress Notes (Addendum)
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 Otherwise, no CAD. Per Dr. Sallyanne Kuster, patient OK to be discharged from a Cardiology standpoint once TR band comes off. He recommends outpatient PFTs and GI consult. Updated primary team. Outpatient follow-up arranged.  Darreld Mclean, PA-C 06/03/2021 4:14 PM

## 2021-06-03 NOTE — Progress Notes (Addendum)
Called for right radial site hematoma.  Slight hematoma present proximal to stick site. Hematoma the size of a dime proximal to tr band.   Tr band repositioned 1/4 proximal, and reinflated. Tr band adjusted to 10 cc air. No s+s of additional hematoma.  Sp02 93% as measured in right thumb. Brisk capillary refill present.     TR band pressure adjusted to 12cc;

## 2021-06-03 NOTE — Progress Notes (Signed)
Made Cecilie Kicks NP aware of returning hematoma. When pressure held it does soften. This was after Aaron Edelman from cath lab had repositioned band. Stated Dr. Tamala Julian would be up to see. Cont to monitor. Carroll Kinds RN

## 2021-06-03 NOTE — Interval H&P Note (Signed)
History and Physical Interval Note:  06/03/2021 8:45 AM  Andrew Love  has presented today for surgery, with the diagnosis of chest pain - shortness of breath.  The various methods of treatment have been discussed with the patient and family. After consideration of risks, benefits and other options for treatment, the patient has consented to  Procedure(s): LEFT HEART CATH AND CORONARY ANGIOGRAPHY (N/A) as a surgical intervention.  The patient's history has been reviewed, patient examined, no change in status, stable for surgery.  I have reviewed the patient's chart and labs.  Questions were answered to the patient's satisfaction.    Cath Lab Visit (complete for each Cath Lab visit)  Clinical Evaluation Leading to the Procedure:   ACS: No.  Non-ACS:    Anginal Classification: CCS III  Anti-ischemic medical therapy: Maximal Therapy (2 or more classes of medications)  Non-Invasive Test Results: No non-invasive testing performed  Prior CABG: No previous CABG        Early [place]
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
## 2021-06-03 NOTE — Plan of Care (Signed)
  Problem: Coping: Goal: Level of anxiety will decrease Outcome: Progressing   Problem: Pain Managment: Goal: General experience of comfort will improve Outcome: Progressing   Problem: Safety: Goal: Ability to remain free from injury will improve Outcome: Progressing   

## 2021-06-03 NOTE — Care Management (Signed)
1257 06-03-21 Case Manager called the [facility] and the patient is a member of the [facility]. Provider is [place], [place] and CSW is [place] @ [phone] ext [phone], pager is [phone]. Case Manager will continue to follow for additional transition of care needs.

## 2021-06-04 DIAGNOSIS — E669 Obesity, unspecified: Secondary | ICD-10-CM

## 2021-06-04 DIAGNOSIS — R7303 Prediabetes: Secondary | ICD-10-CM

## 2021-06-04 DIAGNOSIS — R072 Precordial pain: Secondary | ICD-10-CM

## 2021-06-04 DIAGNOSIS — E785 Hyperlipidemia, unspecified: Secondary | ICD-10-CM

## 2021-06-04 MED ORDER — PANTOPRAZOLE SODIUM 40 MG PO TBEC: 40.0000 mg | DELAYED_RELEASE_TABLET | Freq: Every day | ORAL | 3 refills | Status: DC

## 2021-06-04 MED ORDER — ASPIRIN 81 MG PO TBEC: 81.0000 mg | DELAYED_RELEASE_TABLET | Freq: Every day | ORAL | 11 refills | Status: DC

## 2021-06-04 MED ORDER — ROSUVASTATIN CALCIUM 20 MG PO TABS: 20.0000 mg | ORAL_TABLET | Freq: Every day | ORAL | 3 refills | Status: DC

## 2021-06-04 NOTE — Progress Notes (Signed)
Nursing DC note  Patient alert and oriented verbablized understanding of dc instrucitons All belongings and dc papers given to patient and family. Patient going home via car with wife.

## 2021-06-04 NOTE — Progress Notes (Signed)
Progress Note  Patient Name: Andrew Love Date of Encounter: 06/04/2021  [facility] Cardiologist: Reola Calkins Domenic Polite)  Subjective   No cardiovascular complaints this morning.  Cardiac catheterization showed no evidence of meaningful CAD (95% stenosis in an RV marginal branch).  LVEDP was normal. No problems in the right radial access site.  Inpatient Medications    Scheduled Meds:  alum & mag hydroxide-simeth  30 mL Oral Once   And   lidocaine  15 mL Oral Once   aspirin EC  81 mg Oral Daily   enoxaparin (LOVENOX) injection  40 mg Subcutaneous Q24H   gabapentin  300 mg Oral QHS   levothyroxine  300 mcg Oral q morning   pantoprazole  40 mg Oral Daily   rosuvastatin  20 mg Oral QHS   sodium chloride flush  3 mL Intravenous Q12H   Continuous Infusions:  famotidine Stopped (06/01/21 2232)   PRN Meds: acetaminophen, baclofen, nitroGLYCERIN, ondansetron (ZOFRAN) IV, zolpidem   Vital Signs    Vitals:   06/03/21 1840 06/03/21 2040 06/04/21 0040 06/04/21 0513  BP: 125/79 131/80 127/70 129/84  Pulse: 80 82 64 72  Resp:      Temp:  (!) 97.4 F (36.3 C) 98.6 F (37 C) 98.7 F (37.1 C)  TempSrc:  Oral Oral Oral  SpO2: 90% 92% 99% 97%  Weight:      Height:       No intake or output data in the 24 hours ending 06/04/21 0918 Last 3 Weights 06/02/2021 06/02/2021 06/01/2021  Weight (lbs) 236 lb 14.4 oz 238 lb 235 lb  Weight (kg) 107.457 kg 107.956 kg 106.595 kg      Telemetry    Normal sinus rhythm- Personally Reviewed  ECG    Normal sinus rhythm, deep with sharp Q waves in leads I and aVL- Personally Reviewed  Physical Exam  Mildly obese.  Appears comfortable lying flat in bed.  Healthy right radial cath access site GEN: No acute distress.   Neck: No JVD Cardiac: RRR, no murmurs, rubs, or gallops.  Respiratory: Clear to auscultation bilaterally. GI: Soft, nontender, non-distended  MS: No edema; No deformity. Neuro:  Nonfocal  Psych: Normal affect   Labs     High Sensitivity Troponin:   Recent Labs  Lab 06/01/21 2016 06/01/21 2208  TROPONINIHS 4 3     Chemistry Recent Labs  Lab 06/02/21 0544 06/03/21 0140 06/03/21 0644  NA 139 136 137  K 3.7 3.6 4.2  CL 105 105 108  CO2 24 23 22   GLUCOSE 120* 147* 203*  BUN 20 16 15   CREATININE 1.54* 1.51* 1.46*  CALCIUM 8.9 8.2* 8.7*  MG 2.5* 2.4  --   PROT 6.8  --   --   ALBUMIN 4.1  --   --   AST 28  --   --   ALT 46*  --   --   ALKPHOS 80  --   --   BILITOT 0.9  --   --   GFRNONAA 53* 55* 57*  ANIONGAP 10 8 7     Lipids  Recent Labs  Lab 06/02/21 0544  CHOL 179  TRIG 255*  HDL 26*  LDLCALC 102*  CHOLHDL 6.9    Hematology Recent Labs  Lab 06/01/21 2016 06/02/21 0544 06/03/21 0140  WBC 9.7 8.7 8.3  RBC 4.81 4.62 4.47  HGB 15.8 15.1 14.6  HCT 45.4 43.5 41.5  MCV 94.4 94.2 92.8  MCH 32.8 32.7 32.7  MCHC 34.8 34.7  35.2  RDW 12.7 12.8 12.6  PLT 233 174 189   Thyroid No results for input(s): TSH, FREET4 in the last 168 hours.  BNPNo results for input(s): BNP, PROBNP in the last 168 hours.  DDimer  Recent Labs  Lab 06/01/21 2016  DDIMER <0.27     Radiology    CARDIAC CATHETERIZATION  Result Date: 06/03/2021   RV Branch 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   LV end diastolic pressure is normal.   The left ventricular ejection fraction is 55-65% by visual estimate. 1.  Distal RV marginal disease that is collateralized by the left system; medical therapy should be pursued as this is a relatively small vessel with otherwise mild obstructive disease elsewhere. 2.  LVEDP of 13 mmHg with preserved left ventricular function.   ECHOCARDIOGRAM COMPLETE  Result Date: 06/02/2021    ECHOCARDIOGRAM REPORT   Patient Name:   Andrew Love Date of Exam: 06/02/2021 Medical Rec #:  [id number]        Height:       72.0 in Accession #:    [id number]       Weight:       238.0 lb Date of Birth:  [birth year]        BSA:          2.294 m Patient Age:    56 years         BP:           116/73 mmHg Patient  Gender: M                HR:           62 bpm. Exam Location:  [facility] Procedure: 2D Echo, Cardiac Doppler and Color Doppler Indications:    Chest Pain  History:        Patient has prior history of Echocardiogram examinations, most                 recent 01/07/2015. CAD, Stroke, Signs/Symptoms:Chest Pain; Risk                 Factors:Hypertension and Dyslipidemia.  Sonographer:    Wenda Low Referring Phys: [id number] [place]  1. Left ventricular ejection fraction, by estimation, is 60 to 65%. The left ventricle has normal function. The left ventricle has no regional wall motion abnormalities. There is mild left ventricular hypertrophy. Left ventricular diastolic parameters are indeterminate.  2. Right ventricular systolic function is normal. The right ventricular size is normal. There is normal pulmonary artery systolic pressure. The estimated right ventricular systolic pressure is 62.6 mmHg.  3. The mitral valve is grossly normal. Trivial mitral valve regurgitation.  4. The aortic valve is tricuspid. Aortic valve regurgitation is not visualized. No aortic stenosis is present. Aortic valve mean gradient measures 3.0 mmHg.  5. The inferior vena cava is normal in size with greater than 50% respiratory variability, suggesting right atrial pressure of 3 mmHg. Comparison(s): Prior images unable to be directly viewed. FINDINGS  Left Ventricle: Left ventricular ejection fraction, by estimation, is 60 to 65%. The left ventricle has normal function. The left ventricle has no regional wall motion abnormalities. The left ventricular internal cavity size was normal in size. There is  mild left ventricular hypertrophy. Left ventricular diastolic parameters are indeterminate. Right Ventricle: The right ventricular size is normal. No increase in right ventricular wall thickness. Right ventricular systolic function is normal. There is normal pulmonary artery systolic pressure. The tricuspid regurgitant  velocity is  2.06 m/s, and  with an assumed right atrial pressure of 3 mmHg, the estimated right ventricular systolic pressure is 69.6 mmHg. Left Atrium: Left atrial size was normal in size. Right Atrium: Right atrial size was normal in size. Pericardium: There is no evidence of pericardial effusion. Mitral Valve: The mitral valve is grossly normal. Trivial mitral valve regurgitation. MV peak gradient, 4.9 mmHg. The mean mitral valve gradient is 2.0 mmHg. Tricuspid Valve: The tricuspid valve is grossly normal. Tricuspid valve regurgitation is trivial. Aortic Valve: The aortic valve is tricuspid. Aortic valve regurgitation is not visualized. No aortic stenosis is present. Aortic valve mean gradient measures 3.0 mmHg. Aortic valve peak gradient measures 6.0 mmHg. Aortic valve area, by VTI measures 2.83 cm. Pulmonic Valve: The pulmonic valve was grossly normal. Pulmonic valve regurgitation is trivial. Aorta: The aortic root is normal in size and structure. Venous: The inferior vena cava is normal in size with greater than 50% respiratory variability, suggesting right atrial pressure of 3 mmHg. IAS/Shunts: No atrial level shunt detected by color flow Doppler.  LEFT VENTRICLE PLAX 2D LVIDd:         4.90 cm     Diastology LVIDs:         3.00 cm     LV e' medial:    7.07 cm/s LV PW:         1.20 cm     LV E/e' medial:  14.0 LV IVS:        1.40 cm     LV e' lateral:   6.85 cm/s LVOT diam:     2.00 cm     LV E/e' lateral: 14.5 LV SV:         75 LV SV Index:   33 LVOT Area:     3.14 cm  LV Volumes (MOD) LV vol d, MOD A2C: 71.7 ml LV vol d, MOD A4C: 79.3 ml LV vol s, MOD A2C: 24.9 ml LV vol s, MOD A4C: 28.3 ml LV SV MOD A2C:     46.8 ml LV SV MOD A4C:     79.3 ml LV SV MOD BP:      49.9 ml RIGHT VENTRICLE RV Basal diam:  3.20 cm RV Mid diam:    2.80 cm RV S prime:     10.00 cm/s TAPSE (M-mode): 2.1 cm LEFT ATRIUM             Index        RIGHT ATRIUM           Index LA diam:        3.60 cm 1.57 cm/m   RA Area:     18.90 cm  LA Vol (A2C):   32.2 ml 14.04 ml/m  RA Volume:   53.80 ml  23.45 ml/m LA Vol (A4C):   31.0 ml 13.51 ml/m LA Biplane Vol: 33.3 ml 14.52 ml/m  AORTIC VALVE                    PULMONIC VALVE AV Area (Vmax):    2.58 cm     PV Vmax:       0.68 m/s AV Area (Vmean):   2.30 cm     PV Peak grad:  1.8 mmHg AV Area (VTI):     2.83 cm AV Vmax:           122.00 cm/s AV Vmean:          83.200 cm/s AV VTI:  0.265 m AV Peak Grad:      6.0 mmHg AV Mean Grad:      3.0 mmHg LVOT Vmax:         100.00 cm/s LVOT Vmean:        60.900 cm/s LVOT VTI:          0.239 m LVOT/AV VTI ratio: 0.90  AORTA Ao Root diam: 3.10 cm Ao Asc diam:  2.90 cm MITRAL VALVE                TRICUSPID VALVE MV Area (PHT): 3.17 cm     TR Peak grad:   17.0 mmHg MV Area VTI:   1.82 cm     TR Vmax:        206.00 cm/s MV Peak grad:  4.9 mmHg MV Mean grad:  2.0 mmHg     SHUNTS MV Vmax:       1.11 m/s     Systemic VTI:  0.24 m MV Vmean:      61.1 cm/s    Systemic Diam: 2.00 cm MV Decel Time: 239 msec MV E velocity: 99.00 cm/s MV A velocity: 122.00 cm/s MV E/A ratio:  0.81 Rozann Lesches MD Electronically signed by Rozann Lesches MD Signature Date/Time: 06/02/2021/1:32:54 PM    Final     Cardiac Studies   Cardiac catheterization 06/03/2021    RV Branch 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   LV end diastolic pressure is normal.   The left ventricular ejection fraction is 55-65% by visual estimate.   1.  Distal RV marginal disease that is collateralized by the left system; medical therapy should be pursued as this is a relatively small vessel with otherwise mild obstructive disease elsewhere. 2.  LVEDP of 13 mmHg with preserved left ventricular function.    Echocardiogram 06/02/2021  1. Left ventricular ejection fraction, by estimation, is 60 to 65%. The left ventricle has normal function. The left ventricle has no regional wall motion abnormalities. There is mild left ventricular hypertrophy. Left ventricular diastolic parameters are indeterminate.    2. Right ventricular systolic function is normal. The right ventricular size is normal. There is normal pulmonary artery systolic pressure. The estimated right ventricular systolic pressure is 16.1 mmHg.   3. The mitral valve is grossly normal. Trivial mitral valve regurgitation.   4. The aortic valve is tricuspid. Aortic valve regurgitation is not visualized. No aortic stenosis is present. Aortic valve mean gradient measures 3.0 mmHg.   5. The inferior vena cava is normal in size with greater than 50% respiratory variability, suggesting right atrial pressure of 3 mmHg. Comparison(s): Prior images unable to be directly viewed.  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  He is trying to get into see a gastroenterologist at the [facility] center in [place].  Would recommend empirical treatment with combination PPI plus H2 blocker in the meantime. CAD: The location of significant stenosis is in a RV marginal branch unlikely to cause any symptoms.  However, important to treat risk factors including dyslipidemia.  Target LDL less than 100, preferably less than 70. Dyslipidemia: His LDL is actually not bad at 102, but he has a very low HDL at 26 and elevated triglycerides.  55,  and the pattern predictive of small dense LDL with increased risk of long-term vascular complications and risk of developing full-blown diabetes mellitus.  Recommend attention to a healthy diet low in sugars, starches with high glycemic index and saturated fat and increased intake of lean protein and unsaturated fat.  Also needs to increase physical exercise, which may be a challenge due to chronic low  back pain and advise substantial weight loss to improve his cardiovascular risk and prevent the complications of diabetes. PreDM: Hemoglobin A1c of 6.2% places him close to full DM range. Exertional dyspnea: Would recommend repeat PFTs due to history of occupational exposure to potentially toxic fumes. [facility] will sign off.   Medication Recommendations:  consider adding H2 blocker to PPI Other recommendations (labs, testing, etc): Follow-up on lipid profile and hemoglobin A1c after improved diet and exercise. Follow up as an outpatient: As needed  For questions or updates, please contact [place] Please consult [email or web address] for contact info under        Signed, Sanda Klein, MD  06/04/2021, 9:18 AM

## 2021-06-05 NOTE — Discharge Summary (Signed)
Physician Discharge Summary  Andrew Love DOB: 03/18/1966 DOA: 06/01/2021  PCP: [facility]  Admit date: 06/01/2021 Discharge date: 06/04/2021  Admitted From: Home.  Disposition:  Home.   Recommendations for Outpatient Follow-up:  Follow up with PCP in 1-2 weeks Please obtain BMP/CBC in one week Please follow up with pulmonology in 1 to 2 weeks.   Discharge Condition: stable.  CODE STATUS:full code.  Diet recommendation: Heart Healthy   Brief/Interim Summary:  Andrew Love is a 57 y.o. male with medical history significant for CAD, GERD, hypothyroidism,obesity, chronic back pain who presents to the emergency department due to 1 week onset of left-sided chest pain.  He was admitted for further evaluation of chest pain as he had some concerning history with prior history of CAD.  His EKG showed normal sinus rhythm and troponins were negative.  He continues to have some intermittent left-sided chest pain and has been seen by cardiology with recommendation to pursue cardiac catheterization on 1/27.  Discharge Diagnoses:  Active Problems:   CAD (coronary artery disease)   Hypothyroidism   Nausea without vomiting   AKI (acute kidney injury) ([place])   Chest pain   Obesity (BMI 30.0-34.9)   Chronic back pain  Chest pain, with h/o CAD Troponins negative,  Cardiac cath done  showed no evidence of meaningful CAD (95% stenosis in an RV marginal branch).  LVEDP was normal. Echo showed preserved LVEF of 60 to 65% without wall motion abn.  Continue with aspirin and statin.   recommend outpatient follow up with pulmonology and GI in one week.    Hypertension:  Well controlled.      AKI;  Baseline creatinine around 1.1.  Admitted with a creatinine of 1.6 slight improvement to 1.5.         Hypothyroidism:  Resume synthroid.        GERD; Stable.        Body mass index is 32.13 kg/m. Obesity:      Hyperlipidemia:  Resume statin.      Discharge Instructions  Discharge Instructions     Ambulatory referral to Pulmonology   Complete by: As directed    Reason for referral: Asthma/COPD   Diet - low sodium heart healthy   Complete by: As directed    Discharge instructions   Complete by: As directed    Please follow up with gastroenterology and pulmonary in the next 1 to 2 weeks.   Increase activity slowly   Complete by: As directed       Allergies as of 06/04/2021       Reactions   Dust Mite Mixed Allergen Ext [mite (d. Farinae)] Anaphylaxis, Hives   Other Hives, Shortness Of Breath   Cockroaches   Iohexol Hives   Almond (diagnostic) Hives   Oysters [shellfish Allergy] Hives   Wheat Bran Hives   Yeast-related Products Hives        Medication List     STOP taking these medications    Dexilant 60 MG capsule Generic drug: dexlansoprazole   dicyclomine 10 MG capsule Commonly known as: BENTYL   fluticasone 50 MCG/ACT nasal spray Commonly known as: FLONASE   HYDROmorphone 2 MG tablet Commonly known as: Dilaudid   ibuprofen 200 MG tablet Commonly known as: ADVIL   methylPREDNISolone 4 MG Tbpk tablet Commonly known as: MEDROL DOSEPAK   sennosides-docusate sodium 8.6-50 MG tablet Commonly known as: SENOKOT-S       TAKE these medications    acetaminophen 500 MG  tablet Commonly known as: TYLENOL Take 500-1,000 mg by mouth every 6 (six) hours as needed for mild pain or fever.   aspirin 81 MG EC tablet Take 1 tablet (81 mg total) by mouth daily. Swallow whole.   baclofen 10 MG tablet Commonly known as: LIORESAL Take 1 tablet (10 mg total) by mouth 3 (three) times daily. As needed for muscle spasm   doxepin 10 MG capsule Commonly known as: SINEQUAN Take 1 capsule by mouth at bedtime as needed.   EPINEPHrine 0.3 mg/0.3 mL Soaj injection Commonly known as: EpiPen Inject 0.3 mLs (0.3 mg total) into the muscle as needed. What changed: reasons to take this   escitalopram 10 MG  tablet Commonly known as: LEXAPRO Take 10 mg by mouth daily.   gabapentin 300 MG capsule Commonly known as: NEURONTIN Take 1 capsule by mouth at bedtime.   levothyroxine 300 MCG tablet Commonly known as: SYNTHROID Take 300 mcg by mouth every morning.   losartan 100 MG tablet Commonly known as: COZAAR Take 100 mg by mouth daily.   meclizine 25 MG tablet Commonly known as: ANTIVERT Take 1 tablet (25 mg total) by mouth 3 (three) times daily as needed for dizziness.   naloxone 4 MG/0.1ML Liqd nasal spray kit Commonly known as: NARCAN SPRAY 1 SPRAY INTO ONE NOSTRIL AS DIRECTED FOR OPIOID OVERDOSE (TURN PERSON ON SIDE AFTER DOSE. IF NO RESPONSE IN 2-3 MINUTES OR PERSON RESPONDS BUT RELAPSES, REPEAT USING A NEW SPRAY DEVICE AND SPRAY INTO THE OTHER NOSTRIL. CALL 911 AFTER USE.) * EMERGENCY USE ONLY *   ondansetron 4 MG tablet Commonly known as: ZOFRAN Take 1 tablet (4 mg total) by mouth every 6 (six) hours.   pantoprazole 40 MG tablet Commonly known as: PROTONIX Take 1 tablet (40 mg total) by mouth daily.   promethazine 25 MG tablet Commonly known as: PHENERGAN Take 1 tablet (25 mg total) by mouth every 6 (six) hours as needed for nausea or vomiting.   rosuvastatin 20 MG tablet Commonly known as: CRESTOR Take 1 tablet (20 mg total) by mouth at bedtime.        Follow-up Information     [facility], [facility] .   Why: As needed Contact information: [place] [address] [phone]         [facility] .   Specialty: Emergency Medicine Why: If symptoms worsen Contact information: [address] [id number] [place] [address] [place], [place], PA-C Follow up on 06/29/2021.   Specialties: Physician Assistant, Cardiology Why: at 3:30pm for your follow up appt Contact information: [place] [address] [phone]                Allergies   Allergen Reactions   Dust Mite Mixed Allergen Ext [Mite (D. Farinae)] Anaphylaxis and Hives   Other Hives and Shortness Of Breath    Cockroaches   Iohexol Hives   Almond (Diagnostic) Hives   Oysters [employer] Allergy] Hives   Wheat Bran Hives   Yeast-Related Products Hives    Consultations: Cardiology.    Procedures/Studies: CARDIAC CATHETERIZATION  Result Date: 06/03/2021   RV Branch 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   LV end diastolic pressure is normal.   The left ventricular ejection fraction is 55-65% by visual estimate. 1.  Distal RV marginal disease that is collateralized by the left system; medical therapy should be pursued as this is a relatively small  vessel with otherwise mild obstructive disease elsewhere. 2.  LVEDP of 13 mmHg with preserved left ventricular function.   DG Chest Port 1 View  Result Date: 06/01/2021 CLINICAL DATA:  Chest pain. EXAM: PORTABLE CHEST 1 VIEW COMPARISON:  Chest x-ray 12/07/2020. FINDINGS: The heart size and mediastinal contours are within normal limits. Both lungs are clear. The visualized skeletal structures are unremarkable. IMPRESSION: No active disease. Electronically Signed   By: Ronney Asters M.D.   On: 06/01/2021 20:52   ECHOCARDIOGRAM COMPLETE  Result Date: 06/02/2021    ECHOCARDIOGRAM REPORT   Patient Name:   Andrew Love Date of Exam: 06/02/2021 Medical Rec #:  [id number]        Height:       72.0 in Accession #:    [id number]       Weight:       238.0 lb Date of Birth:  May 21, 1965        BSA:          2.294 m Patient Age:    18 years         BP:           116/73 mmHg Patient Gender: M                HR:           62 bpm. Exam Location:  [facility] Procedure: 2D Echo, Cardiac Doppler and Color Doppler Indications:    Chest Pain  History:        Patient has prior history of Echocardiogram examinations, most                 recent 01/07/2015. CAD, Stroke, Signs/Symptoms:Chest Pain; Risk                 Factors:Hypertension and Dyslipidemia.   Sonographer:    Wenda Low Referring Phys: [id number] [place]  1. Left ventricular ejection fraction, by estimation, is 60 to 65%. The left ventricle has normal function. The left ventricle has no regional wall motion abnormalities. There is mild left ventricular hypertrophy. Left ventricular diastolic parameters are indeterminate.  2. Right ventricular systolic function is normal. The right ventricular size is normal. There is normal pulmonary artery systolic pressure. The estimated right ventricular systolic pressure is 29.5 mmHg.  3. The mitral valve is grossly normal. Trivial mitral valve regurgitation.  4. The aortic valve is tricuspid. Aortic valve regurgitation is not visualized. No aortic stenosis is present. Aortic valve mean gradient measures 3.0 mmHg.  5. The inferior vena cava is normal in size with greater than 50% respiratory variability, suggesting right atrial pressure of 3 mmHg. Comparison(s): Prior images unable to be directly viewed. FINDINGS  Left Ventricle: Left ventricular ejection fraction, by estimation, is 60 to 65%. The left ventricle has normal function. The left ventricle has no regional wall motion abnormalities. The left ventricular internal cavity size was normal in size. There is  mild left ventricular hypertrophy. Left ventricular diastolic parameters are indeterminate. Right Ventricle: The right ventricular size is normal. No increase in right ventricular wall thickness. Right ventricular systolic function is normal. There is normal pulmonary artery systolic pressure. The tricuspid regurgitant velocity is 2.06 m/s, and  with an assumed right atrial pressure of 3 mmHg, the estimated right ventricular systolic pressure is 18.8 mmHg. Left Atrium: Left atrial size was normal in size. Right Atrium: Right atrial size was normal in size. Pericardium: There is no evidence of pericardial effusion. Mitral Valve: The  mitral valve is grossly normal. Trivial mitral  valve regurgitation. MV peak gradient, 4.9 mmHg. The mean mitral valve gradient is 2.0 mmHg. Tricuspid Valve: The tricuspid valve is grossly normal. Tricuspid valve regurgitation is trivial. Aortic Valve: The aortic valve is tricuspid. Aortic valve regurgitation is not visualized. No aortic stenosis is present. Aortic valve mean gradient measures 3.0 mmHg. Aortic valve peak gradient measures 6.0 mmHg. Aortic valve area, by VTI measures 2.83 cm. Pulmonic Valve: The pulmonic valve was grossly normal. Pulmonic valve regurgitation is trivial. Aorta: The aortic root is normal in size and structure. Venous: The inferior vena cava is normal in size with greater than 50% respiratory variability, suggesting right atrial pressure of 3 mmHg. IAS/Shunts: No atrial level shunt detected by color flow Doppler.  LEFT VENTRICLE PLAX 2D LVIDd:         4.90 cm     Diastology LVIDs:         3.00 cm     LV e' medial:    7.07 cm/s LV PW:         1.20 cm     LV E/e' medial:  14.0 LV IVS:        1.40 cm     LV e' lateral:   6.85 cm/s LVOT diam:     2.00 cm     LV E/e' lateral: 14.5 LV SV:         75 LV SV Index:   33 LVOT Area:     3.14 cm  LV Volumes (MOD) LV vol d, MOD A2C: 71.7 ml LV vol d, MOD A4C: 79.3 ml LV vol s, MOD A2C: 24.9 ml LV vol s, MOD A4C: 28.3 ml LV SV MOD A2C:     46.8 ml LV SV MOD A4C:     79.3 ml LV SV MOD BP:      49.9 ml RIGHT VENTRICLE RV Basal diam:  3.20 cm RV Mid diam:    2.80 cm RV S prime:     10.00 cm/s TAPSE (M-mode): 2.1 cm LEFT ATRIUM             Index        RIGHT ATRIUM           Index LA diam:        3.60 cm 1.57 cm/m   RA Area:     18.90 cm LA Vol (A2C):   32.2 ml 14.04 ml/m  RA Volume:   53.80 ml  23.45 ml/m LA Vol (A4C):   31.0 ml 13.51 ml/m LA Biplane Vol: 33.3 ml 14.52 ml/m  AORTIC VALVE                    PULMONIC VALVE AV Area (Vmax):    2.58 cm     PV Vmax:       0.68 m/s AV Area (Vmean):   2.30 cm     PV Peak grad:  1.8 mmHg AV Area (VTI):     2.83 cm AV Vmax:           122.00 cm/s AV  Vmean:          83.200 cm/s AV VTI:            0.265 m AV Peak Grad:      6.0 mmHg AV Mean Grad:      3.0 mmHg LVOT Vmax:         100.00 cm/s LVOT Vmean:  60.900 cm/s LVOT VTI:          0.239 m LVOT/AV VTI ratio: 0.90  AORTA Ao Root diam: 3.10 cm Ao Asc diam:  2.90 cm MITRAL VALVE                TRICUSPID VALVE MV Area (PHT): 3.17 cm     TR Peak grad:   17.0 mmHg MV Area VTI:   1.82 cm     TR Vmax:        206.00 cm/s MV Peak grad:  4.9 mmHg MV Mean grad:  2.0 mmHg     SHUNTS MV Vmax:       1.11 m/s     Systemic VTI:  0.24 m MV Vmean:      61.1 cm/s    Systemic Diam: 2.00 cm MV Decel Time: 239 msec MV E velocity: 99.00 cm/s MV A velocity: 122.00 cm/s MV E/A ratio:  0.81 Rozann Lesches MD Electronically signed by Rozann Lesches MD Signature Date/Time: 06/02/2021/1:32:54 PM    Final       Subjective: No new complaints.   Discharge Exam: Vitals:   06/04/21 0040 06/04/21 0513  BP: 127/70 129/84  Pulse: 64 72  Resp:    Temp: 98.6 F (37 C) 98.7 F (37.1 C)  SpO2: 99% 97%   Vitals:   06/03/21 1840 06/03/21 2040 06/04/21 0040 06/04/21 0513  BP: 125/79 131/80 127/70 129/84  Pulse: 80 82 64 72  Resp:      Temp:  (!) 97.4 F (36.3 C) 98.6 F (37 C) 98.7 F (37.1 C)  TempSrc:  Oral Oral Oral  SpO2: 90% 92% 99% 97%  Weight:      Height:        General: Pt is alert, awake, not in acute distress Cardiovascular: RRR, S1/S2 +, no rubs, no gallops Respiratory: CTA bilaterally, no wheezing, no rhonchi Abdominal: Soft, NT, ND, bowel sounds + Extremities: no edema, no cyanosis    The results of significant diagnostics from this hospitalization (including imaging, microbiology, ancillary and laboratory) are listed below for reference.     Microbiology: Recent Results (from the past 240 hour(s))  Resp Panel by RT-PCR (Flu A&B, Covid) Nasopharyngeal Swab     Status: None   Collection Time: 06/01/21  8:14 PM   Specimen: Nasopharyngeal Swab; Nasopharyngeal(NP) swabs in vial transport  medium  Result Value Ref Range Status   SARS Coronavirus 2 by RT PCR NEGATIVE NEGATIVE Final    Comment: (NOTE) SARS-CoV-2 target nucleic acids are NOT DETECTED.  The SARS-CoV-2 RNA is generally detectable in upper respiratory specimens during the acute phase of infection. The lowest concentration of SARS-CoV-2 viral copies this assay can detect is 138 copies/mL. A negative result does not preclude SARS-Cov-2 infection and should not be used as the sole basis for treatment or other patient management decisions. A negative result may occur with  improper specimen collection/handling, submission of specimen other than nasopharyngeal swab, presence of viral mutation(s) within the areas targeted by this assay, and inadequate number of viral copies(<138 copies/mL). A negative result must be combined with clinical observations, patient history, and epidemiological information. The expected result is Negative.  Fact Sheet for Patients:  EntrepreneurPulse.com.au  Fact Sheet for Healthcare Providers:  IncredibleEmployment.be  This test is no t yet approved or cleared by the [place] FDA and  has been authorized for detection and/or diagnosis of SARS-CoV-2 by FDA under an Emergency Use Authorization (EUA). This EUA will remain  in effect (meaning  this test can be used) for the duration of the COVID-19 declaration under Section 564(b)(1) of the Act, 21 U.S.C.section 360bbb-3(b)(1), unless the authorization is terminated  or revoked sooner.       Influenza A by PCR NEGATIVE NEGATIVE Final   Influenza B by PCR NEGATIVE NEGATIVE Final    Comment: (NOTE) The Xpert Xpress SARS-CoV-2/FLU/RSV plus assay is intended as an aid in the diagnosis of influenza from Nasopharyngeal swab specimens and should not be used as a sole basis for treatment. Nasal washings and aspirates are unacceptable for Xpert Xpress SARS-CoV-2/FLU/RSV testing.  Fact Sheet for  Patients: EntrepreneurPulse.com.au  Fact Sheet for Healthcare Providers: IncredibleEmployment.be  This test is not yet approved or cleared by the [place] FDA and has been authorized for detection and/or diagnosis of SARS-CoV-2 by FDA under an Emergency Use Authorization (EUA). This EUA will remain in effect (meaning this test can be used) for the duration of the COVID-19 declaration under Section 564(b)(1) of the Act, 21 U.S.C. section 360bbb-3(b)(1), unless the authorization is terminated or revoked.  Performed at [facility], [address], [place], [place] [address]      Labs: BNP (last 3 results) No results for input(s): BNP in the last 8760 hours. Basic Metabolic Panel: Recent Labs  Lab 06/01/21 2016 06/02/21 0544 06/03/21 0140 06/03/21 0644  NA 138 139 136 137  K 4.0 3.7 3.6 4.2  CL 105 105 105 108  CO2 _0 GLUCOSE 119* 120* 147* 203*  BUN _1 CREATININE 1.63* 1.54* 1.51* 1.46*  CALCIUM 8.7* 8.9 8.2* 8.7*  MG  --  2.5* 2.4  --   PHOS  --  3.8  --   --    Liver Function Tests: Recent Labs  Lab 06/02/21 0544  AST 28  ALT 46*  ALKPHOS 80  BILITOT 0.9  PROT 6.8  ALBUMIN 4.1   No results for input(s): LIPASE, AMYLASE in the last 168 hours. No results for input(s): AMMONIA in the last 168 hours. CBC: Recent Labs  Lab 06/01/21 2016 06/02/21 0544 06/03/21 0140  WBC 9.7 8.7 8.3  HGB 15.8 15.1 14.6  HCT 45.4 43.5 41.5  MCV 94.4 94.2 92.8  PLT 233 174 189   Cardiac Enzymes: No results for input(s): CKTOTAL, CKMB, CKMBINDEX, TROPONINI in the last 168 hours. BNP: Invalid input(s): POCBNP CBG: No results for input(s): GLUCAP in the last 168 hours. D-Dimer No results for input(s): DDIMER in the last 72 hours. Hgb A1c No results for input(s): HGBA1C in the last 72 hours. Lipid Profile No results for input(s): CHOL, HDL, LDLCALC, TRIG, CHOLHDL, LDLDIRECT in the last 72 hours. Thyroid function  studies No results for input(s): TSH, T4TOTAL, T3FREE, THYROIDAB in the last 72 hours.  Invalid input(s): FREET3 Anemia work up No results for input(s): VITAMINB12, FOLATE, FERRITIN, TIBC, IRON, RETICCTPCT in the last 72 hours. Urinalysis    Component Value Date/Time   COLORURINE YELLOW 04/10/2019 0707   APPEARANCEUR CLEAR 04/10/2019 0707   LABSPEC 1.017 04/10/2019 0707   PHURINE 5.0 04/10/2019 0707   GLUCOSEU NEGATIVE 04/10/2019 0707   HGBUR NEGATIVE 04/10/2019 0707   BILIRUBINUR NEGATIVE 04/10/2019 0707   KETONESUR NEGATIVE 04/10/2019 0707   PROTEINUR NEGATIVE 04/10/2019 0707   UROBILINOGEN 0.2 03/31/2014 0150   NITRITE NEGATIVE 04/10/2019 0707   LEUKOCYTESUR NEGATIVE 04/10/2019 0707   Sepsis Labs Invalid input(s): PROCALCITONIN,  WBC,  LACTICIDVEN Microbiology Recent Results (from the past 240 hour(s))  Resp Panel by RT-PCR (  Flu A&B, Covid) Nasopharyngeal Swab     Status: None   Collection Time: 06/01/21  8:14 PM   Specimen: Nasopharyngeal Swab; Nasopharyngeal(NP) swabs in vial transport medium  Result Value Ref Range Status   SARS Coronavirus 2 by RT PCR NEGATIVE NEGATIVE Final    Comment: (NOTE) SARS-CoV-2 target nucleic acids are NOT DETECTED.  The SARS-CoV-2 RNA is generally detectable in upper respiratory specimens during the acute phase of infection. The lowest concentration of SARS-CoV-2 viral copies this assay can detect is 138 copies/mL. A negative result does not preclude SARS-Cov-2 infection and should not be used as the sole basis for treatment or other patient management decisions. A negative result may occur with  improper specimen collection/handling, submission of specimen other than nasopharyngeal swab, presence of viral mutation(s) within the areas targeted by this assay, and inadequate number of viral copies(<138 copies/mL). A negative result must be combined with clinical observations, patient history, and epidemiological information. The expected  result is Negative.  Fact Sheet for Patients:  EntrepreneurPulse.com.au  Fact Sheet for Healthcare Providers:  IncredibleEmployment.be  This test is no t yet approved or cleared by the [place] FDA and  has been authorized for detection and/or diagnosis of SARS-CoV-2 by FDA under an Emergency Use Authorization (EUA). This EUA will remain  in effect (meaning this test can be used) for the duration of the COVID-19 declaration under Section 564(b)(1) of the Act, 21 U.S.C.section 360bbb-3(b)(1), unless the authorization is terminated  or revoked sooner.       Influenza A by PCR NEGATIVE NEGATIVE Final   Influenza B by PCR NEGATIVE NEGATIVE Final    Comment: (NOTE) The Xpert Xpress SARS-CoV-2/FLU/RSV plus assay is intended as an aid in the diagnosis of influenza from Nasopharyngeal swab specimens and should not be used as a sole basis for treatment. Nasal washings and aspirates are unacceptable for Xpert Xpress SARS-CoV-2/FLU/RSV testing.  Fact Sheet for Patients: EntrepreneurPulse.com.au  Fact Sheet for Healthcare Providers: IncredibleEmployment.be  This test is not yet approved or cleared by the [place] FDA and has been authorized for detection and/or diagnosis of SARS-CoV-2 by FDA under an Emergency Use Authorization (EUA). This EUA will remain in effect (meaning this test can be used) for the duration of the COVID-19 declaration under Section 564(b)(1) of the Act, 21 U.S.C. section 360bbb-3(b)(1), unless the authorization is terminated or revoked.  Performed at [facility], [address] [place], [place] [address]      Time coordinating discharge: 38 minutes.   SIGNED:   Hosie Poisson, MD  [facility] 06/05/2021, 5:07 PM

## 2021-06-06 ENCOUNTER — Encounter (HOSPITAL_COMMUNITY): Payer: Self-pay | Admitting: Internal Medicine

## 2021-06-06 MED FILL — Diphenhydramine HCl Inj 50 MG/ML: INTRAMUSCULAR | Qty: 1 | Status: AC

## 2021-06-14 ENCOUNTER — Encounter: Payer: Self-pay | Admitting: Internal Medicine

## 2021-06-28 NOTE — Progress Notes (Signed)
Cardiology Office Note    Date:  06/29/2021   ID:  Andrew Love, DOB 1966/04/27, MRN [id number]  PCP:  [facility]  Cardiologist: Rozann Lesches, MD    Chief Complaint  Patient presents with   Hospitalization Follow-up    History of Present Illness:    Andrew Love is a 56 y.o. male CAD (s/p cath in 2009 showing 30% RCA stenosis and likely microvascular angina, low-risk NST in 2014), chronic back pain, HTN, hypothyroidism, diverticulosis and GERD who presents to the office today for hospital follow-up.  He most recently presented to [facility] ED on 06/01/2021 for evaluation of worsening dyspnea on exertion for the past 3 to 4 days. He reported typically walking on a treadmill without symptoms but had developed shortness of breath with minimal activity around his home. Cardiac enzymes were negative and his EKG was without acute ST changes. Echocardiogram showed a preserved EF of 60 to 65% with no regional wall motion abnormalities. His cardiac catheterization showed 95% stenosis of a distal RV branch which was collateralized by the left system and medical therapy was recommended given the relatively small size of the vessel. Was recommend that he see GI as an outpatient for further evaluation of his presenting symptoms.  In talking with the patient today, he reports his breathing has improved since recent admission and he has been golfing and performing chores around the house. He does experience occasional chest discomfort which he describes as a shooting pain and this occurs with activity and then resolves with rest or taking deep breaths. No specific orthopnea, PND or pitting edema. He has noticed worsening muscle aches since being started on Crestor and did have myalgias with statin therapy in the past.  Past Medical History:  Diagnosis Date   Arthritis    CAD (coronary artery disease)    a. Minor disease with suspected microvascular angina 2009 - [facility] b. cath in  05/2021 showing 95% distal RV branch stenosis collateralized by the left system with medical management recommended given small vessel size.   Chronic back pain    Chronic left hip pain    Colitis    Per medical history from dated 06/13/10.   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   Sigmoid colon ulcer    Rectal polyps   Sleep apnea with use of continuous positive airway pressure (CPAP)    Urticaria     Past Surgical History:  Procedure Laterality Date   APPENDECTOMY     8th grade   BACK SURGERY     CARDIAC CATHETERIZATION  2009   CARDIAC CATHETERIZATION     Per medical history from dated 06/13/10.   CERVICAL SPINE SURGERY     C4, C5, C6 spinal fusion   CHOLECYSTECTOMY N/A 08/31/2015   Procedure: LAPAROSCOPIC CHOLECYSTECTOMY WITH INTRAOPERATIVE CHOLANGIOGRAM;  Surgeon: Excell Seltzer, MD;  Location: [facility];  Service: General;  Laterality: N/A;   COLONOSCOPY  07/2008   Colitis,NSAID v. Ischemia,malrotation of the gut,Diverticulosis(L),hyperplastic   COLONOSCOPY N/A 12/13/2012   Dr. Oneida Alar: Normal TI, mild sigmoid diverticulosis, hemorrhoids, 2 polyps (tubular adenoma). Random colon bx negative. Next TCS 12/2022 with Fentanyl/phenergan  ESOPHAGOGASTRODUODENOSCOPY N/A 04/16/2014   R[facility]: Erosive reflux esophagitis. Non critical Schzki's ring not manipulated. Small hiatal hernia. Abnormal gastirc mucosa of doubtful signigicance status post biopsy. I suspect trivial upper GI bleed. Recent abdominal pain presumably secondary to a protracted bout of diverticulitis. CT scan November 23 revealed improvemetn without complication. He finished his antibiotics 2 days age. Left sided ab   JOINT REPLACEMENT     LAPAROSCOPIC  SIGMOID COLECTOMY  2012   Dr. Fanny Skates: recurrent sigmoid diverticulitis   LAPAROSCOPIC SIGMOID COLECTOMY  2012   recurernt sigmoid diverticulitis, Dr. Dalbert Batman    LEFT HEART CATH AND CORONARY ANGIOGRAPHY N/A 06/03/2021   Procedure: LEFT HEART CATH AND CORONARY ANGIOGRAPHY;  Surgeon: Early Osmond, MD;  Location: [facility];  Service: Cardiovascular;  Laterality: N/A;   ROTATOR CUFF REPAIR     Left - per medical history form dated 06/13/10.   SHOULDER SURGERY     Left   THYROIDECTOMY     Per medical history form dated 06/13/10.   TOTAL HIP ARTHROPLASTY  07/04/2011   Procedure: TOTAL HIP ARTHROPLASTY;  Surgeon: Johnny Bridge, MD;  Location: [place];  Service: Orthopedics;  Laterality: Right;   TOTAL HIP ARTHROPLASTY Left 07/22/2019   Procedure: TOTAL HIP ARTHROPLASTY;  Surgeon: Marchia Bond, MD;  Location: [facility];  Service: Orthopedics;  Laterality: Left;    Current Medications: Outpatient Medications Prior to Visit  Medication Sig Dispense Refill   acetaminophen (TYLENOL) 500 MG tablet Take 500-1,000 mg by mouth every 6 (six) hours as needed for mild pain or fever.     aspirin EC 81 MG EC tablet Take 1 tablet (81 mg total) by mouth daily. Swallow whole. 30 tablet 11   baclofen (LIORESAL) 10 MG tablet Take 1 tablet (10 mg total) by mouth 3 (three) times daily. As needed for muscle spasm 50 tablet 0   EPINEPHrine (EPIPEN) 0.3 mg/0.3 mL SOAJ Inject 0.3 mLs (0.3 mg total) into the muscle as needed. (Patient taking differently: Inject 0.3 mg into the muscle as needed for anaphylaxis.) 1 Device 3   escitalopram (LEXAPRO) 10 MG tablet Take 10 mg by mouth daily.     gabapentin (NEURONTIN) 300 MG capsule Take 1 capsule by mouth at bedtime.     levothyroxine (SYNTHROID, LEVOTHROID) 300 MCG tablet Take 300 mcg by mouth every morning.      losartan (COZAAR) 100 MG tablet Take 100 mg by mouth daily.     meclizine (ANTIVERT) 25 MG tablet Take 1 tablet (25 mg total) by mouth 3 (three) times  daily as needed for dizziness. 30 tablet 0   naloxone (NARCAN) nasal spray 4 mg/0.1 mL SPRAY 1 SPRAY INTO ONE NOSTRIL AS DIRECTED FOR OPIOID OVERDOSE (TURN PERSON ON SIDE AFTER DOSE. IF NO RESPONSE IN 2-3 MINUTES OR PERSON RESPONDS BUT RELAPSES, REPEAT USING A NEW SPRAY DEVICE AND SPRAY INTO THE OTHER NOSTRIL. CALL 911 AFTER USE.) * EMERGENCY USE ONLY *     ondansetron (ZOFRAN) 4 MG tablet Take 1 tablet (4 mg total) by mouth every 6 (six) hours. 12 tablet 0   pantoprazole (PROTONIX) 40 MG tablet Take 1 tablet (40 mg total) by mouth daily. 30 tablet 3   promethazine (PHENERGAN) 25 MG tablet Take 1 tablet (25 mg total) by mouth every 6 (six) hours as needed for nausea or vomiting. 10 tablet 0   traZODone (DESYREL) 100 MG tablet TAKE 1-3 TABS BY MOUTH AT BEDTIME AS NEEDED FOR SLEEP     rosuvastatin (CRESTOR)  20 MG tablet Take 1 tablet (20 mg total) by mouth at bedtime. 30 tablet 3   doxepin (SINEQUAN) 10 MG capsule Take 1 capsule by mouth at bedtime as needed. (Patient not taking: Reported on 06/29/2021)     Facility-Administered Medications Prior to Visit  Medication Dose Route Frequency Provider Last Rate Last Admin   omalizumab Arvid Right) injection 300 mg  300 mg Subcutaneous Q28 days Valentina Shaggy, MD   300 mg at 07/09/19 1229     Allergies:   Dust mite mixed allergen ext [mite (d. 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   Married   Social Determinants of Radio broadcast assistant Strain: Not on file  Food Insecurity: Not on file  Transportation Needs: Not on file  Physical Activity: Not on file  Stress: Not on file  Social Connections: Not on file     Family History:  The patient's family history includes Cancer in his father and mother; Diverticulitis in his mother; Hypertension in his father and mother.   Review of Systems:    Please see the history of present illness.     All other systems reviewed and are otherwise negative except as noted above.   Physical Exam:    VS:  BP (!) 138/92    Pulse 86    Ht 6' (1.829 m)    Wt 243 lb 12.8 oz (110.6 kg)    SpO2 96%    BMI 33.07 kg/m    General: Well developed, well nourished,male appearing in no acute distress. Head: Normocephalic, atraumatic. Neck: No carotid bruits. JVD not elevated.  Lungs: Respirations regular and unlabored, without wheezes or rales.  Heart: Regular rate and rhythm. No S3 or S4.  No murmur, no rubs, or gallops appreciated. Abdomen: Appears non-distended. No obvious abdominal masses. Msk:  Strength and tone appear normal for age. No obvious joint deformities or effusions. Extremities: No clubbing or cyanosis. No pitting edema.  Distal pedal pulses are 2+ bilaterally. Radial site stable without ecchymosis or evidence of a hematoma.  Neuro: Alert and oriented X 3. Moves all extremities spontaneously. No focal deficits noted. Psych:  Responds to questions appropriately with a normal affect. Skin: No rashes or lesions noted  Wt Readings from Last 3 Encounters:  06/29/21 243 lb 12.8 oz (110.6 kg)  06/02/21 236 lb 14.4 oz (107.5 kg)  03/23/21  234 lb (106.1 kg)     Studies/Labs Reviewed:   EKG:  EKG is ordered today.  The ekg ordered today demonstrates NSR, HR 86 with TWI along Lead III.   Recent Labs: 06/02/2021: ALT 46 06/03/2021: BUN 15; Creatinine, Ser 1.46; Hemoglobin 14.6; Magnesium 2.4; Platelets 189; Potassium 4.2; Sodium 137   Lipid Panel    Component Value Date/Time   CHOL 179 06/02/2021 0544   TRIG 255 (H) 06/02/2021 0544   HDL 26 (L) 06/02/2021 0544   CHOLHDL 6.9 06/02/2021 0544   VLDL 51 (H) 06/02/2021 0544   LDLCALC 102 (H) 06/02/2021 0544    Additional studies/ records that were reviewed today include:   Echocardiogram: 05/2021 IMPRESSIONS     1. Left ventricular ejection fraction, by estimation, is 60 to 65%. The  left ventricle has normal function. The left ventricle has no regional  wall motion abnormalities. There is mild left ventricular hypertrophy.  Left ventricular diastolic parameters  are indeterminate.   2. Right ventricular systolic function is normal. The right ventricular  size is normal. There is normal pulmonary artery systolic pressure. The  estimated right ventricular systolic pressure is 54.6 mmHg.   3. The mitral valve is grossly normal. Trivial mitral valve  regurgitation.   4. The aortic valve is tricuspid. Aortic valve regurgitation is not  visualized. No aortic stenosis is present. Aortic valve mean gradient  measures 3.0 mmHg.   5. The inferior vena cava is normal in size with greater than 50%  respiratory variability, suggesting right atrial pressure of 3 mmHg.   Comparison(s): Prior images unable to be directly viewed.   LHC: 05/2021 RV Branch 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   LV end diastolic pressure is normal.   The left ventricular ejection fraction is 55-65% by visual estimate.   1.  Distal RV marginal disease that is collateralized by the left system; medical therapy should be pursued as this is a relatively small vessel with otherwise mild obstructive disease  elsewhere. 2.  LVEDP of 13 mmHg with preserved left ventricular function.  Assessment:    1. Coronary artery disease involving native heart with other form of angina pectoris, unspecified vessel or lesion type ([place])   2. Essential hypertension   3. Hyperlipidemia LDL goal <70      Plan:   In order of problems listed above:  1. CAD - Recent catheterization showed 95% stenosis of a distal RV branch which was collateralized by the left system and medical therapy was recommended given the relatively small size of the vessel.  - He continues to have brief episodes of chest pain which occur with activity and resolve with rest and overall consistent with angina likely due to small vessel disease. He is on ASA 81 mg daily and statin therapy but will reduce his Crestor dose as outlined below secondary to myalgias. Will provide an Rx for Imdur and we reviewed that he can take 15 mg daily for several days then titrate to 30 mg daily. Will also provide an Rx for SL NTG.   2. HTN - His BP is at 138/92 during today's visit. He remains on Losartan 100 mg daily and will add Imdur 30 mg daily as outlined above which should also assist with BP control.  3. HLD - FLP during recent admission showed total cholesterol of 179, HDL 26, triglycerides 255 and LDL 102. He was started on Crestor 20mg  daily and reports myalgias with this. Therefore, will reduce dosing to 10 mg daily. I encouraged him to reach out if myalgias persist as we could further reduce this to 5 mg daily or switch  to Zetia. He has also made significant dietary changes which should help with readings as well. Would anticipate obtaining a repeat FLP and LFT's at his next visit if not obtained by his PCP in the interim.    Medication Adjustments/Labs and Tests Ordered: Current medicines are reviewed at length with the patient today.  Concerns regarding medicines are outlined above.  Medication changes, Labs and Tests ordered today are listed in  the Patient Instructions below. Patient Instructions  Medication Instructions:  START Imdur 30 mg tablets daily Nitrostat 0.4 mg SL  tablets as needed for chest pain DECREASE Crestor to 10 mg tablets daily  Follow-Up: Follow up with Dr. Domenic Polite or Bernerd Pho, PA-C in 3-4 months  Any Other Special Instructions Will Be Listed Below (If Applicable).     If you need a refill on your cardiac medications before your next appointment, please call your pharmacy.    Signed, Erma Heritage, PA-C  06/29/2021 4:47 PM    [facility] [address] [place], [place] [address] Phone: [phone] Fax: [phone]

## 2021-06-29 ENCOUNTER — Ambulatory Visit (INDEPENDENT_AMBULATORY_CARE_PROVIDER_SITE_OTHER): Payer: Self-pay | Admitting: Student

## 2021-06-29 ENCOUNTER — Encounter: Payer: Self-pay | Admitting: Student

## 2021-06-29 ENCOUNTER — Other Ambulatory Visit: Payer: Self-pay

## 2021-06-29 VITALS — BP 138/92 | HR 86 | Ht 72.0 in | Wt 243.8 lb

## 2021-06-29 DIAGNOSIS — I25118 Atherosclerotic heart disease of native coronary artery with other forms of angina pectoris: Secondary | ICD-10-CM

## 2021-06-29 DIAGNOSIS — E785 Hyperlipidemia, unspecified: Secondary | ICD-10-CM

## 2021-06-29 DIAGNOSIS — I10 Essential (primary) hypertension: Secondary | ICD-10-CM

## 2021-06-29 MED ORDER — ISOSORBIDE MONONITRATE ER 30 MG PO TB24: 30.0000 mg | ORAL_TABLET | Freq: Every day | ORAL | 3 refills | Status: DC

## 2021-06-29 MED ORDER — ROSUVASTATIN CALCIUM 10 MG PO TABS: 10.0000 mg | ORAL_TABLET | Freq: Every day | ORAL | 3 refills | Status: DC

## 2021-06-29 MED ORDER — NITROGLYCERIN 0.4 MG SL SUBL: 0.4000 mg | SUBLINGUAL_TABLET | SUBLINGUAL | 3 refills | Status: DC | PRN

## 2021-06-29 NOTE — Patient Instructions (Signed)
Medication Instructions:  START Imdur 30 mg tablets daily Nitrostat 0.4 mg SL  tablets as needed for chest pain DECREASE Crestor to 10 mg tablets daily  Follow-Up: Follow up with Dr. Domenic Polite or Bernerd Pho, PA-C in 3-4 months  Any Other Special Instructions Will Be Listed Below (If Applicable).     If you need a refill on your cardiac medications before your next appointment, please call your pharmacy.

## 2021-07-13 ENCOUNTER — Ambulatory Visit: Payer: No Typology Code available for payment source | Admitting: Internal Medicine

## 2021-08-03 ENCOUNTER — Ambulatory Visit (INDEPENDENT_AMBULATORY_CARE_PROVIDER_SITE_OTHER): Payer: No Typology Code available for payment source | Admitting: Internal Medicine

## 2021-08-03 VITALS — BP 126/70 | HR 84 | Temp 97.7°F | Ht 72.0 in | Wt 238.2 lb

## 2021-08-03 DIAGNOSIS — Z791 Long term (current) use of non-steroidal anti-inflammatories (NSAID): Secondary | ICD-10-CM

## 2021-08-03 DIAGNOSIS — K219 Gastro-esophageal reflux disease without esophagitis: Secondary | ICD-10-CM | POA: Diagnosis not present

## 2021-08-03 DIAGNOSIS — R1319 Other dysphagia: Secondary | ICD-10-CM | POA: Diagnosis not present

## 2021-08-03 DIAGNOSIS — D126 Benign neoplasm of colon, unspecified: Secondary | ICD-10-CM

## 2021-08-03 NOTE — Patient Instructions (Signed)
We will schedule you for upper endoscopy to further evaluate your worsening heartburn/acid reflux, difficulty swallowing. ? ?At the same time I will perform colonoscopy given your history of polyps as well as to evaluate your previously noted rectal bleeding. ? ?If you do have hemorrhoids, we can consider treatment for these in the clinic with banding. ? ?Continue on pantoprazole 40 mg daily for now.  We may need to make adjustments depending on endoscopic findings. ? ?It was nice meeting you today.  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 ? ?Dr. Abbey Chatters ? ?Lifestyle and home remedies TO MANAGE REFLUX/HEARTBURN ? ?  ?You may eliminate or reduce the frequency of heartburn by making the following lifestyle changes: ?  ?Control your weight. Being overweight is a major risk factor for heartburn and GERD. Excess pounds put pressure on your abdomen, pushing up your stomach and causing acid to back up into your esophagus.  ?  ?Eat smaller meals. 4 TO 6 MEALS A DAY. This reduces pressure on the lower esophageal sphincter, helping to prevent the valve from opening and acid from washing back into your esophagus. ?  ?  ?Loosen your belt. Clothes that fit tightly around your waist put pressure on your abdomen and the lower esophageal sphincter. ?  ?  ?Eliminate heartburn triggers. Everyone has specific triggers. Common triggers such as fatty or fried foods, spicy food, tomato sauce, carbonated beverages, alcohol, chocolate, mint, garlic, onion, caffeine and nicotine may make heartburn worse.  ?  ?Avoid stooping or bending. Tying your shoes is OK. Bending over for longer periods to weed your garden isn't, especially soon after eating.  ?  ?Don't lie down after a meal. Wait at least three to four hours after eating before going to bed, and don't lie down right after eating.  ? ?

## 2021-08-03 NOTE — Progress Notes (Signed)
? ? ?Referring Provider: [facility] ?Primary Care Physician:  [facility] ?Primary GI:  Dr. Abbey Chatters ? ?Chief Complaint  ?Patient presents with  ? Rectal Bleeding  ?  Pt hasn't bleed in awhile  ? ? ?HPI:   ?Andrew Love is a 56 y.o. male who presents to clinic today for follow-up visit.  Last seen in our clinic in December 2020. ? 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  History of chronic diarrhea in the past.  His last colonoscopy 2014 with tubular adenomas, plan for repeat colonoscopy in 10 years with fentanyl/Phenergan..  Last EGD in December 2015 with erosive reflux esophagitis, noncritical Schatzki ring not manipulated.  Abnormal gastric mucosa, biopsy showed reactive gastropathy but no H. pylori.  ? ?Today he has multiple GI complaints for me.  He states his acid reflux and heartburn has been out of control.  Currently takes pantoprazole 40 mg daily which is not keeping his symptoms under good control.  Also with intermittent esophageal dysphagia primarily with solids.  Notes solids especially.  No melena hematochezia.  Does note daily NSAID use due to multiple joint issues. ? ?Also was having rectal bleeding until approximately 4 months ago.  Prior to that he had intermittent rectal bleeding 2-3 times per week with bowel movements.  Notes bright red blood on tissue paper and toilet bowl.  Some minor rectal discomfort/irritation at times. ? ? ? ?Past Medical History:  ?Diagnosis Date  ? Arthritis   ? CAD (coronary artery disease)   ? a. Minor disease with suspected microvascular angina 2009 - [facility] b. cath in 05/2021 showing 95% distal RV branch stenosis collateralized by the left system with medical management recommended given small vessel size.  ? Chronic back pain   ? Chronic left hip pain   ? Colitis   ? Per medical  history from dated 06/13/10.  ? Diverticulitis   ? Hx of; requiring 3 admissions  ? Elevated liver enzymes   ? GERD (gastroesophageal reflux disease)   ? Hypertension   ? Hypothyroidism   ? Left leg pain   ? Chronic  ? 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 Pneumonia 02/2019  ? Psoriasis   ? Per medical history form dated 06/13/10.  ? Sigmoid colon ulcer   ? Rectal polyps  ? Sleep apnea with use of continuous positive airway pressure (CPAP)   ? Urticaria   ? ? ?Past Surgical History:  ?Procedure Laterality Date  ? APPENDECTOMY    ? 8th grade  ? BACK SURGERY    ? CARDIAC CATHETERIZATION  2009  ? CARDIAC CATHETERIZATION    ? Per medical history from dated 06/13/10.  ? CERVICAL SPINE SURGERY    ? C4, C5, C6 spinal fusion  ? CHOLECYSTECTOMY N/A 08/31/2015  ? Procedure: LAPAROSCOPIC CHOLECYSTECTOMY WITH INTRAOPERATIVE CHOLANGIOGRAM;  Surgeon: Excell Seltzer, MD;  Location: [facility];  Service: General;  Laterality: N/A;  ? COLONOSCOPY  07/2008  ? Colitis,NSAID v. Ischemia,malrotation of the gut,Diverticulosis(L),hyperplastic  ? COLONOSCOPY N/A 12/13/2012  ? Dr. Oneida Alar: Normal TI, mild sigmoid diverticulosis, hemorrhoids, 2 polyps (tubular adenoma). Random colon bx negative. Next TCS 12/2022 with Fentanyl/phenergan  ? ESOPHAGOGASTRODUODENOSCOPY N/A 04/16/2014  ? [facility]: Erosive reflux esophagitis. Non critical Schzki's ring not manipulated. Small hiatal hernia. Abnormal gastirc mucosa of doubtful signigicance status post biopsy. I suspect trivial upper GI bleed. Recent  abdominal pain presumably secondary to a protracted bout of diverticulitis. CT scan November 23 revealed improvemetn without complication. He finished his antibiotics 2 days age. Left sided ab  ? JOINT REPLACEMENT    ? LAPAROSCOPIC SIGMOID COLECTOMY  2012  ? Dr. Fanny Skates: recurrent sigmoid diverticulitis  ? LAPAROSCOPIC SIGMOID COLECTOMY  2012  ? recurernt sigmoid diverticulitis, Dr. Dalbert Batman   ? [place] CATH AND CORONARY ANGIOGRAPHY N/A  06/03/2021  ? Procedure: LEFT HEART CATH AND CORONARY ANGIOGRAPHY;  Surgeon: Early Osmond, MD;  Location: [facility];  Service: Cardiovascular;  Laterality: N/A;  ? ROTATOR CUFF REPAIR    ? Left - per medical history form dated 06/13/10.  ? SHOULDER SURGERY    ? Left  ? THYROIDECTOMY    ? Per medical history form dated 06/13/10.  ? TOTAL HIP ARTHROPLASTY  07/04/2011  ? Procedure: TOTAL HIP ARTHROPLASTY;  Surgeon: Johnny Bridge, MD;  Location: [facility];  Service: Orthopedics;  Laterality: Right;  ? TOTAL HIP ARTHROPLASTY Left 07/22/2019  ? Procedure: TOTAL HIP ARTHROPLASTY;  Surgeon: Marchia Bond, MD;  Location: [facility];  Service: Orthopedics;  Laterality: Left;  ? ? ?Current Outpatient Medications  ?Medication Sig Dispense Refill  ? acetaminophen (TYLENOL) 500 MG tablet Take 500-1,000 mg by mouth every 6 (six) hours as needed for mild pain or fever.    ? aspirin EC 81 MG EC tablet Take 1 tablet (81 mg total) by mouth daily. Swallow whole. 30 tablet 11  ? baclofen (LIORESAL) 10 MG tablet Take 1 tablet (10 mg total) by mouth 3 (three) times daily. As needed for muscle spasm 50 tablet 0  ? EPINEPHrine (EPIPEN) 0.3 mg/0.3 mL SOAJ Inject 0.3 mLs (0.3 mg total) into the muscle as needed. (Patient taking differently: Inject 0.3 mg into the muscle as needed for anaphylaxis.) 1 Device 3  ? escitalopram (LEXAPRO) 10 MG tablet Take 10 mg by mouth daily.    ? gabapentin (NEURONTIN) 300 MG capsule Take 1 capsule by mouth at bedtime.    ? levothyroxine (SYNTHROID, LEVOTHROID) 300 MCG tablet Take 300 mcg by mouth every morning.     ? losartan (COZAAR) 100 MG tablet Take 100 mg by mouth daily.    ? meclizine (ANTIVERT) 25 MG tablet Take 1 tablet (25 mg total) by mouth 3 (three) times daily as needed for dizziness. 30 tablet 0  ? naloxone (NARCAN) nasal spray 4 mg/0.1 mL SPRAY 1 SPRAY INTO ONE NOSTRIL AS DIRECTED FOR OPIOID OVERDOSE (TURN PERSON ON SIDE AFTER DOSE. IF NO RESPONSE IN 2-3 MINUTES OR PERSON RESPONDS BUT RELAPSES,  REPEAT USING A NEW SPRAY DEVICE AND SPRAY INTO THE OTHER NOSTRIL. CALL 911 AFTER USE.) * EMERGENCY USE ONLY *    ? nitroGLYCERIN (NITROSTAT) 0.4 MG SL tablet Place 1 tablet (0.4 mg total) under the tongue every 5 (five) minutes as needed for chest pain. Up to 3 tablets then call 911 or go to the nearest ED. 90 tablet 3  ? ondansetron (ZOFRAN) 4 MG tablet Take 1 tablet (4 mg total) by mouth every 6 (six) hours. 12 tablet 0  ? pantoprazole (PROTONIX) 40 MG tablet Take 1 tablet (40 mg total) by mouth daily. 30 tablet 3  ? promethazine (PHENERGAN) 25 MG tablet Take 1 tablet (25 mg total) by mouth every 6 (six) hours as needed for nausea or vomiting. 10 tablet 0  ? rosuvastatin (CRESTOR) 10 MG tablet Take 1 tablet (10 mg total) by mouth daily. 90 tablet 3  ?  traZODone (DESYREL) 100 MG tablet TAKE 1-3 TABS BY MOUTH AT BEDTIME AS NEEDED FOR SLEEP    ? ?Current Facility-Administered Medications  ?Medication Dose Route Frequency Provider Last Rate Last Admin  ? omalizumab Arvid Right) injection 300 mg  300 mg Subcutaneous Q28 days Valentina Shaggy, MD   300 mg at 07/09/19 1229  ? ? ?Allergies as of 08/03/2021 - Review Complete 08/03/2021  ?Allergen Reaction Noted  ? Dust mite mixed allergen ext [mite (d. farinae)] Anaphylaxis and Hives 08/06/2019  ? Other Hives and Shortness Of Breath 01/08/2013  ? Iohexol Hives 03/31/2014  ? Almond (diagnostic) Hives 08/06/2019  ? Oysters [shellfish allergy] Hives 06/19/2016  ? Wheat bran Hives 06/19/2016  ? Yeast-related products Hives 07/04/2016  ? ? ?Family History  ?Problem Relation Age of Onset  ? Cancer Mother   ?     breast cancer - per medical history form dated 06/13/10.  ? Diverticulitis Mother   ? Hypertension Mother   ? Cancer Father   ?     skin - per medical history form dated 06/13/10.  ? Hypertension Father   ? Anesthesia problems Neg Hx   ? Hypotension Neg Hx   ? Malignant hyperthermia Neg Hx   ? Pseudochol deficiency Neg Hx   ? Colon cancer Neg Hx   ? ? ?Social History   ? ?Socioeconomic History  ? Marital status: Married  ?  Spouse name: Not on file  ? Number of children: Not on file  ? Years of education: Not on file  ? Highest education level: Not on file  ?Occupational History

## 2021-08-10 ENCOUNTER — Other Ambulatory Visit: Payer: Self-pay

## 2021-08-10 ENCOUNTER — Telehealth: Payer: Self-pay

## 2021-08-10 MED ORDER — PEG 3350-KCL-NA BICARB-NACL 420 G PO SOLR
4000.0000 mL | ORAL | 0 refills | Status: DC
Start: 2021-08-10 — End: 2021-10-17

## 2021-08-10 NOTE — Telephone Encounter (Signed)
Pre-op appt 09/14/21. Appt letter mailed with procedure instructions. ?

## 2021-08-10 NOTE — Telephone Encounter (Signed)
Called pt, TCS/EGD/DIL ASA 3 w/Dr. Abbey Chatters scheduled for 09/16/21 at 10:15am. Rx for prep sent to pharmacy. Orders entered.  ?

## 2021-09-12 ENCOUNTER — Telehealth: Payer: Self-pay

## 2021-09-12 NOTE — Telephone Encounter (Signed)
Pt wants to speak to someone about canceling his procedure. ?

## 2021-09-13 NOTE — Telephone Encounter (Signed)
Called pt, he had shoulder surgery and needs to reschedule procedure. TCS/EGD/DIL ASA 3 scheduled for 09/16/21 moved to 10/17/21 at 8:15am. Endo scheduler informed.  ? ?Previous [place], [place] [phone], expires 01/09/22.  ?

## 2021-09-13 NOTE — Telephone Encounter (Signed)
Pre-op appt 10/13/21. Appt letter mailed with procedure instructions. ?

## 2021-09-14 ENCOUNTER — Encounter (HOSPITAL_COMMUNITY): Payer: No Typology Code available for payment source

## 2021-10-06 ENCOUNTER — Ambulatory Visit (INDEPENDENT_AMBULATORY_CARE_PROVIDER_SITE_OTHER): Payer: No Typology Code available for payment source | Admitting: Student

## 2021-10-06 ENCOUNTER — Encounter: Payer: Self-pay | Admitting: Student

## 2021-10-06 VITALS — BP 138/86 | HR 96 | Ht 72.0 in | Wt 244.6 lb

## 2021-10-06 DIAGNOSIS — I10 Essential (primary) hypertension: Secondary | ICD-10-CM

## 2021-10-06 DIAGNOSIS — E785 Hyperlipidemia, unspecified: Secondary | ICD-10-CM

## 2021-10-06 DIAGNOSIS — I25118 Atherosclerotic heart disease of native coronary artery with other forms of angina pectoris: Secondary | ICD-10-CM | POA: Diagnosis not present

## 2021-10-06 MED ORDER — ISOSORBIDE MONONITRATE ER 30 MG PO TB24: 30.0000 mg | ORAL_TABLET | Freq: Every day | ORAL | 3 refills | Status: DC

## 2021-10-06 MED ORDER — ROSUVASTATIN CALCIUM 5 MG PO TABS: 5.0000 mg | ORAL_TABLET | ORAL | 11 refills | Status: DC

## 2021-10-06 MED ORDER — NITROGLYCERIN 0.4 MG SL SUBL: 0.4000 mg | SUBLINGUAL_TABLET | SUBLINGUAL | 3 refills | Status: DC | PRN

## 2021-10-06 NOTE — Progress Notes (Signed)
Cardiology Office Note    Date:  10/06/2021   ID:  Andrew Love, DOB 1965/10/23, MRN [id number]  PCP:  Wyline Beady, PA-C  Cardiologist: Rozann Lesches, MD    Chief Complaint  Patient presents with   Follow-up    4 month visit    History of Present Illness:    Andrew Love is a 56 y.o. male with past medical history of CAD (s/p cath in 2009 showing 30% RCA stenosis and likely microvascular angina, low-risk NST in 2014, cath in 05/2021 with 95% distal RV branch stenosis with collaterals present and medical management recommended), chronic back pain, HTN, hypothyroidism, diverticulosis and GERD who presents to the office today for 25-monthfollow-up.   He was last examined by myself in 06/2021 following a recent hospitalization for chest pain during which cardiac catheterization showed 95% stenosis of a distal RV branch which was collateralized by the left system and medical therapy was recommended given the small vessel size. At the time of follow-up, he reported overall doing well and had noticed improvement in his dyspnea since admission. Was still having occasional episodes of shooting pain which would resolve with rest or taking deep breaths. He was continued on ASA 81 mg daily and given reported myalgias with Crestor, this was reduced from 20 mg daily to 10 mg daily. He was started on Imdur 15 mg daily with plans to titrate to 30 mg daily to see if this would help with his recent chest pain.  In talking with the patient today, he reports having more frequent episodes of chest pain over the past several days and in reviewing his medication list, he has been without Imdur for at least a month as he misplaced the bottle due to home renovations taking place. He was unable to locate his sublingual nitroglycerin when these episodes occurred. Reports that his breathing has been stable with no recent orthopnea, PND or pitting edema. He does experience pain along his legs which typically  occurs at rest. He recently underwent shoulder surgery last month and is now working with PT and is hopeful to return to golfing soon. He is trying to increase his activity level at home.   Past Medical History:  Diagnosis Date   Arthritis    CAD (coronary artery disease)    a. Minor disease with suspected microvascular angina 2009 - [facility] b. cath in 05/2021 showing 95% distal RV branch stenosis collateralized by the left system with medical management recommended given small vessel size.   Chronic back pain    Chronic left hip pain    Colitis    Per medical history from dated 06/13/10.   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   Sigmoid colon ulcer    Rectal polyps   Sleep apnea with use of continuous positive airway pressure (CPAP)    Urticaria     Past Surgical History:  Procedure Laterality Date   APPENDECTOMY     8th grade   BACK SURGERY     CARDIAC CATHETERIZATION  2009   CARDIAC CATHETERIZATION     Per medical history from dated 06/13/10.   CERVICAL SPINE SURGERY  C4, C5, C6 spinal fusion   CHOLECYSTECTOMY N/A 08/31/2015   Procedure: LAPAROSCOPIC CHOLECYSTECTOMY WITH INTRAOPERATIVE CHOLANGIOGRAM;  Surgeon: Excell Seltzer, MD;  Location: [facility];  Service: General;  Laterality: N/A;   COLONOSCOPY  07/2008   Colitis,NSAID v. Ischemia,malrotation of the gut,Diverticulosis(L),hyperplastic   COLONOSCOPY N/A 12/13/2012   Dr. Oneida Alar: Normal TI, mild sigmoid diverticulosis, hemorrhoids, 2 polyps (tubular adenoma). Random colon bx negative. Next TCS 12/2022 with Fentanyl/phenergan   ESOPHAGOGASTRODUODENOSCOPY N/A 04/16/2014   R[facility]: Erosive reflux esophagitis. Non critical Schzki's ring not manipulated. Small  hiatal hernia. Abnormal gastirc mucosa of doubtful signigicance status post biopsy. I suspect trivial upper GI bleed. Recent abdominal pain presumably secondary to a protracted bout of diverticulitis. CT scan November 23 revealed improvemetn without complication. He finished his antibiotics 2 days age. Left sided ab   JOINT REPLACEMENT     LAPAROSCOPIC SIGMOID COLECTOMY  2012   Dr. Fanny Skates: recurrent sigmoid diverticulitis   LAPAROSCOPIC SIGMOID COLECTOMY  2012   recurernt sigmoid diverticulitis, Dr. Dalbert Batman    LEFT HEART CATH AND CORONARY ANGIOGRAPHY N/A 06/03/2021   Procedure: LEFT HEART CATH AND CORONARY ANGIOGRAPHY;  Surgeon: Early Osmond, MD;  Location: [facility];  Service: Cardiovascular;  Laterality: N/A;   ROTATOR CUFF REPAIR     Left - per medical history form dated 06/13/10.   SHOULDER SURGERY     Left   THYROIDECTOMY     Per medical history form dated 06/13/10.   TOTAL HIP ARTHROPLASTY  07/04/2011   Procedure: TOTAL HIP ARTHROPLASTY;  Surgeon: Johnny Bridge, MD;  Location: [facility];  Service: Orthopedics;  Laterality: Right;   TOTAL HIP ARTHROPLASTY Left 07/22/2019   Procedure: TOTAL HIP ARTHROPLASTY;  Surgeon: Marchia Bond, MD;  Location: [facility];  Service: Orthopedics;  Laterality: Left;    Current Medications: Outpatient Medications Prior to Visit  Medication Sig Dispense Refill   acetaminophen (TYLENOL) 500 MG tablet Take 500-1,000 mg by mouth every 6 (six) hours as needed for mild pain or fever.     baclofen (LIORESAL) 10 MG tablet Take 1 tablet (10 mg total) by mouth 3 (three) times daily. As needed for muscle spasm (Patient taking differently: Take 10 mg by mouth 3 (three) times daily as needed for muscle spasms.) 50 tablet 0   cholecalciferol (VITAMIN D3) 25 MCG (1000 UNIT) tablet Take 1,000 Units by mouth at bedtime.     diphenhydrAMINE (BENADRYL) 25 MG tablet Take 25-50 mg by mouth every 6 (six) hours as needed for allergies.     EPINEPHrine (EPIPEN) 0.3  mg/0.3 mL SOAJ Inject 0.3 mLs (0.3 mg total) into the muscle as needed. (Patient taking differently: Inject 0.3 mg into the muscle as needed for anaphylaxis.) 1 Device 3   ibuprofen (ADVIL) 200 MG tablet Take 800 mg by mouth every 8 (eight) hours as needed for moderate pain.     levothyroxine (SYNTHROID, LEVOTHROID) 300 MCG tablet Take 300 mcg by mouth every morning.      losartan (COZAAR) 100 MG tablet Take 100 mg by mouth daily.     meclizine (ANTIVERT) 25 MG tablet Take 1 tablet (25 mg total) by mouth 3 (three) times daily as needed for dizziness. 30 tablet 0   Omega-3 Fatty Acids (FISH OIL) 1000 MG CAPS Take 1,000 mg by mouth daily.     ondansetron (ZOFRAN) 4 MG tablet Take 1 tablet (4 mg total) by mouth every 6 (six) hours. (Patient taking differently: Take 4 mg by mouth every 8 (eight) hours  as needed for nausea or vomiting.) 12 tablet 0   pantoprazole (PROTONIX) 40 MG tablet Take 1 tablet (40 mg total) by mouth daily. 30 tablet 3   polyethylene glycol-electrolytes (TRILYTE) 420 g solution Take 4,000 mLs by mouth as directed. 4000 mL 0   promethazine (PHENERGAN) 25 MG tablet Take 1 tablet (25 mg total) by mouth every 6 (six) hours as needed for nausea or vomiting. 10 tablet 0   Tetrahydroz-Dextran-PEG-Povid (VISINE ADVANCED RELIEF) 0.05-0.1-1-1 % SOLN Place 1 drop into both eyes daily as needed (dry eyes).     zolpidem (AMBIEN) 10 MG tablet Take 10 mg by mouth at bedtime.     aspirin EC 81 MG EC tablet Take 1 tablet (81 mg total) by mouth daily. Swallow whole. (Patient not taking: Reported on 10/06/2021) 30 tablet 11   rosuvastatin (CRESTOR) 10 MG tablet Take 1 tablet (10 mg total) by mouth daily. (Patient not taking: Reported on 10/06/2021) 90 tablet 3   Facility-Administered Medications Prior to Visit  Medication Dose Route Frequency Provider Last Rate Last Admin   omalizumab Arvid Right) injection 300 mg  300 mg Subcutaneous Q28 days Valentina Shaggy, MD   300 mg at 07/09/19 1229      Allergies:   Dust mite mixed allergen ext [mite (d. 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   Married   Social Determinants of Radio broadcast assistant Strain: Not on file  Food Insecurity: Not on file  Transportation Needs: Not on file  Physical Activity: Not on file  Stress: Not on file  Social Connections: Not on file     Family History:  The patient's family history includes Cancer in his father and mother; Diverticulitis in his mother; Hypertension in his father and mother.   Review of Systems:    Please see the history of present illness.     All other systems reviewed and are otherwise negative except as noted above.   Physical Exam:    VS:  BP 138/86   Pulse 96   Ht 6' (1.829 m)   Wt 244 lb 9.6 oz (110.9 kg)   SpO2 94%   BMI 33.17 kg/m    General: Well developed, well nourished,male appearing in no acute distress. Head: Normocephalic, atraumatic. Neck: No carotid bruits. JVD not elevated.  Lungs: Respirations regular and unlabored, without wheezes or rales.  Heart: Regular rate and rhythm.  No S3 or S4.  No murmur, no rubs, or gallops appreciated.  Abdomen: Appears non-distended. No obvious abdominal masses. Msk:  Strength and tone appear normal for age. No obvious joint deformities or effusions. Extremities: No clubbing or cyanosis. No pitting edema.  Distal pedal pulses are 2+ bilaterally. Neuro: Alert and oriented X 3. Moves all extremities spontaneously. No focal deficits noted. Psych:  Responds to questions appropriately with a normal affect. Skin: No rashes or lesions noted  Wt Readings from Last 3 Encounters:  10/06/21 244 lb 9.6 oz (110.9 kg)  08/03/21 238 lb 3.2 oz (108 kg)  06/29/21 243 lb 12.8 oz (110.6 kg)     Studies/Labs Reviewed:   EKG:  EKG is not ordered today.   Recent Labs: 06/02/2021: ALT 46 06/03/2021: BUN 15; Creatinine, Ser 1.46; Hemoglobin 14.6; Magnesium 2.4; Platelets 189; Potassium 4.2; Sodium 137   Lipid Panel    Component Value Date/Time   CHOL 179 06/02/2021 0544   TRIG 255 (H) 06/02/2021 0544   HDL 26 (L) 06/02/2021 0544   CHOLHDL 6.9 06/02/2021 0544   VLDL 51 (H) 06/02/2021 0544   LDLCALC 102 (H) 06/02/2021 0544    Additional studies/ records that were reviewed today include:   Echo: 05/2021 IMPRESSIONS     1. Left ventricular ejection fraction, by estimation, is 60 to 65%. The  left ventricle has normal function. The left ventricle has no regional  wall motion abnormalities. There is mild left ventricular hypertrophy.  Left ventricular diastolic parameters  are indeterminate.   2. Right ventricular systolic function is normal. The right ventricular  size is normal. There is normal pulmonary artery systolic pressure. The  estimated right ventricular systolic pressure is 11.9 mmHg.   3. The mitral valve is grossly normal. Trivial mitral valve  regurgitation.   4. The aortic valve is tricuspid. Aortic valve regurgitation is not  visualized. No aortic stenosis is present. Aortic valve mean gradient  measures 3.0 mmHg.   5.  The inferior vena cava is normal in size with greater than 50%  respiratory variability, suggesting right atrial pressure of 3 mmHg.   Comparison(s): Prior images unable to be directly viewed.   LHC: 05/2021 RV Branch 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   LV end diastolic pressure is normal.   The left ventricular ejection fraction is 55-65% by visual estimate.   1.  Distal RV marginal disease that is collateralized by the left system; medical therapy should be pursued as this is a relatively small vessel with otherwise mild obstructive disease elsewhere. 2.  LVEDP of 13 mmHg with preserved left ventricular function.  Assessment:    1. Coronary artery disease involving native coronary artery of native heart with other form of angina pectoris ([place])   2. Essential hypertension   3. Hyperlipidemia LDL goal <70      Plan:   In order of problems listed above:  1. CAD/Chest Pain - He has known small vessel CAD as demonstrated on recent catheterization and has been without Imdur for 1-2 months with recurrent pain in the interim. He had tolerated this well, therefore will plan to restart Imdur '30mg'$  daily. This can be further titrated if needed. Continue ASA '81mg'$  daily and will restart statin therapy as outlined below. Updated Rx for SL NTG provided.   2. HTN - His BP is at 138/86 during today's visit. Continue Losartan '100mg'$  daily and will restart Imdur as outlined above.   3. HLD - His LDL was at 102 in 05/2021. He had myalgias with Crestor '10mg'$  daily and is no longer taking the medication.  Reviewed options and he will initially try Crestor '5mg'$  once weekly. If unable to tolerate this, could try Zetia or would refer to the [facility] for consideration of PCSK-9 inhibitor therapy.     Medication Adjustments/Labs and Tests Ordered: Current medicines are reviewed at length with the patient today.  Concerns regarding medicines are outlined above.  Medication changes, Labs and Tests ordered today are  listed in the Patient Instructions below. Patient Instructions  Medication Instructions:  Your physician has recommended you make the following change in your medication:   Restart Imdur 30 mg Daily  Take Crestor 5 mg one Time Weekly   *If you need a refill on your cardiac medications before your next appointment, please call your pharmacy*   Lab Work: NONE   If you have labs (blood work) drawn today and your tests are completely normal, you will receive your results only by: [place] (if you have MyChart) OR A paper copy in the mail If you have any lab test that is abnormal or we need to change your treatment, we will call you to review the results.   Testing/Procedures: NONE    Follow-Up: At [facility], you and your health needs are our priority.  As part of our continuing mission to provide you with exceptional heart care, we have created designated Provider Care Teams.  These Care Teams include your primary Cardiologist (physician) and Advanced Practice Providers (APPs -  Physician Assistants and Nurse Practitioners) who all work together to provide you with the care you need, when you need it.  We recommend signing up for the patient portal called "MyChart".  Sign up information is provided on this After Visit Summary.  MyChart is used to connect with patients for Virtual Visits (Telemedicine).  Patients are able to view lab/test results, encounter notes, upcoming appointments, etc.  Non-urgent messages can be sent to your provider as well.   To learn more about what you can do with MyChart, go to [email or web address].    Your next appointment:   3-4 month(s)  The format for your next appointment:   In Person  Provider:   You may see Rozann Lesches, MD or one of the following Advanced Practice Providers on your designated Care Team:   [place], PA-C  Ermalinda Barrios, [place] .    Other Instructions Thank you for choosing [facility]!    Important Information About Sugar         Signed, Erma Heritage, PA-C  10/06/2021 7:49 PM    [facility] [address] [place], [place] [address] Phone: [phone] Fax: [phone]

## 2021-10-06 NOTE — Patient Instructions (Signed)
Medication Instructions:  Your physician has recommended you make the following change in your medication:   Restart Imdur 30 mg Daily  Take Crestor 5 mg one Time Weekly   *If you need a refill on your cardiac medications before your next appointment, please call your pharmacy*   Lab Work: NONE   If you have labs (blood work) drawn today and your tests are completely normal, you will receive your results only by: [place] (if you have MyChart) OR A paper copy in the mail If you have any lab test that is abnormal or we need to change your treatment, we will call you to review the results.   Testing/Procedures: NONE    Follow-Up: At [facility], you and your health needs are our priority.  As part of our continuing mission to provide you with exceptional heart care, we have created designated Provider Care Teams.  These Care Teams include your primary Cardiologist (physician) and Advanced Practice Providers (APPs -  Physician Assistants and Nurse Practitioners) who all work together to provide you with the care you need, when you need it.  We recommend signing up for the patient portal called "MyChart".  Sign up information is provided on this After Visit Summary.  MyChart is used to connect with patients for Virtual Visits (Telemedicine).  Patients are able to view lab/test results, encounter notes, upcoming appointments, etc.  Non-urgent messages can be sent to your provider as well.   To learn more about what you can do with MyChart, go to [email or web address].    Your next appointment:   3-4 month(s)  The format for your next appointment:   In Person  Provider:   You may see Rozann Lesches, MD or one of the following Advanced Practice Providers on your designated Care Team:   [place], PA-C  Ermalinda Barrios, [place] .    Other Instructions Thank you for choosing [facility]!    Important Information About Sugar

## 2021-10-12 NOTE — Patient Instructions (Addendum)
Andrew Love  10/12/2021     '@PREFPERIOPPHARMACY'$ @   Your procedure is scheduled on  10/17/2021.   Report to [facility] at  0700 A.M.   Call this number if you have problems the morning of surgery:  [phone]   Remember:  Follow the diet and prep instructions given to you by the office.    Take these medicines the morning of surgery with A SIP OF WATER       baclofen, imdur, levothyroxine, antivert, zofran (if needed),protonix.    Do not wear jewelry, make-up or nail polish.  Do not wear lotions, powders, or perfumes, or deodorant.  Do not shave 48 hours prior to surgery.  Men may shave face and neck.  Do not bring valuables to the hospital.  [facility] is not responsible for any belongings or valuables.  Contacts, dentures or bridgework may not be worn into surgery.  Leave your suitcase in the car.  After surgery it may be brought to your room.  For patients admitted to the hospital, discharge time will be determined by your treatment team.  Patients discharged the day of surgery will not be allowed to drive home and must have someone with them for 24 hours.    Special instructions:   DO NOT smoke tobacco or vape for 24 hours before your procedure.  Please read over the following fact sheets that you were given. Anesthesia Post-op Instructions and Care and Recovery After Surgery      Upper Endoscopy, Adult, Care After This sheet gives you information about how to care for yourself after your procedure. Your health care provider may also give you more specific instructions. If you have problems or questions, contact your health care provider. What can I expect after the procedure? After the procedure, it is common to have: A sore throat. Mild stomach pain or discomfort. Bloating. Nausea. Follow these instructions at home:  Follow instructions from your health care provider about what to eat or drink after your procedure. Return to your  normal activities as told by your health care provider. Ask your health care provider what activities are safe for you. Take over-the-counter and prescription medicines only as told by your health care provider. If you were given a sedative during the procedure, it can affect you for several hours. Do not drive or operate machinery until your health care provider says that it is safe. Keep all follow-up visits as told by your health care provider. This is important. Contact a health care provider if you have: A sore throat that lasts longer than one day. Trouble swallowing. Get help right away if: You vomit blood or your vomit looks like coffee grounds. You have: A fever. Bloody, black, or tarry stools. A severe sore throat or you cannot swallow. Difficulty breathing. Severe pain in your chest or abdomen. Summary After the procedure, it is common to have a sore throat, mild stomach discomfort, bloating, and nausea. If you were given a sedative during the procedure, it can affect you for several hours. Do not drive or operate machinery until your health care provider says that it is safe. Follow instructions from your health care provider about what to eat or drink after your procedure. Return to your normal activities as told by your health care provider. This information is not intended to replace advice given to you by your health care provider. Make sure you discuss any questions you  have with your health care provider. Document Revised: 02/28/2019 Document Reviewed: 09/24/2017 Elsevier Patient Education  [place]. Esophageal Dilatation Esophageal dilatation, also called esophageal dilation, is a procedure to widen or open a blocked or narrowed part of the esophagus. The esophagus is the part of the body that moves food and liquid from the mouth to the stomach. You may need this procedure if: You have a buildup of scar tissue in your esophagus that makes it difficult, painful,  or impossible to swallow. This can be caused by gastroesophageal reflux disease (GERD). You have cancer of the esophagus. There is a problem with how food moves through your esophagus. In some cases, you may need this procedure repeated at a later time to dilate the esophagus gradually. Tell a health care provider about: Any allergies you have. All medicines you are taking, including vitamins, herbs, eye drops, creams, and over-the-counter medicines. Any problems you or family members have had with anesthetic medicines. Any blood disorders you have. Any surgeries you have had. Any medical conditions you have. Any antibiotic medicines you are required to take before dental procedures. Whether you are pregnant or may be pregnant. What are the risks? Generally, this is a safe procedure. However, problems may occur, including: Bleeding due to a tear in the lining of the esophagus. A hole, or perforation, in the esophagus. What happens before the procedure? Ask your health care provider about: Changing or stopping your regular medicines. This is especially important if you are taking diabetes medicines or blood thinners. Taking medicines such as aspirin and ibuprofen. These medicines can thin your blood. Do not take these medicines unless your health care provider tells you to take them. Taking over-the-counter medicines, vitamins, herbs, and supplements. Follow instructions from your health care provider about eating or drinking restrictions. Plan to have a responsible adult take you home from the hospital or clinic. Plan to have a responsible adult care for you for the time you are told after you leave the hospital or clinic. This is important. What happens during the procedure? You may be given a medicine to help you relax (sedative). A numbing medicine may be sprayed into the back of your throat, or you may gargle the medicine. Your health care provider may perform the dilatation using  various surgical instruments, such as: Simple dilators. This instrument is carefully placed in the esophagus to stretch it. Guided wire bougies. This involves using an endoscope to insert a wire into the esophagus. A dilator is passed over this wire to enlarge the esophagus. Then the wire is removed. Balloon dilators. An endoscope with a small balloon is inserted into the esophagus. The balloon is inflated to stretch the esophagus and open it up. The procedure may vary among health care providers and hospitals. What can I expect after the procedure? Your blood pressure, heart rate, breathing rate, and blood oxygen level will be monitored until you leave the hospital or clinic. Your throat may feel slightly sore and numb. This will get better over time. You will not be allowed to eat or drink until your throat is no longer numb. When you are able to drink, urinate, and sit on the edge of the bed without nausea or dizziness, you may be able to return home. Follow these instructions at home: Take over-the-counter and prescription medicines only as told by your health care provider. If you were given a sedative during the procedure, it can affect you for several hours. Do not drive or operate  machinery until your health care provider says that it is safe. Plan to have a responsible adult care for you for the time you are told. This is important. Follow instructions from your health care provider about any eating or drinking restrictions. Do not use any products that contain nicotine or tobacco, such as cigarettes, e-cigarettes, and chewing tobacco. If you need help quitting, ask your health care provider. Keep all follow-up visits. This is important. Contact a health care provider if: You have a fever. You have pain that is not relieved by medicine. Get help right away if: You have chest pain. You have trouble breathing. You have trouble swallowing. You vomit blood. You have black, tarry, or  bloody stools. These symptoms may represent a serious problem that is an emergency. Do not wait to see if the symptoms will go away. Get medical help right away. Call your local emergency services (911 in the [place] Do not drive yourself to the hospital. Summary Esophageal dilatation, also called esophageal dilation, is a procedure to widen or open a blocked or narrowed part of the esophagus. Plan to have a responsible adult take you home from the hospital or clinic. For this procedure, a numbing medicine may be sprayed into the back of your throat, or you may gargle the medicine. Do not drive or operate machinery until your health care provider says that it is safe. This information is not intended to replace advice given to you by your health care provider. Make sure you discuss any questions you have with your health care provider. Document Revised: 09/10/2019 Document Reviewed: 09/10/2019 Elsevier Patient Education  [place]. Colonoscopy, Adult, Care After The following information offers guidance on how to care for yourself after your procedure. Your health care provider may also give you more specific instructions. If you have problems or questions, contact your health care provider. What can I expect after the procedure? After the procedure, it is common to have: A small amount of blood in your stool for 24 hours after the procedure. Some gas. Mild cramping or bloating of your abdomen. Follow these instructions at home: Eating and drinking  Drink enough fluid to keep your urine pale yellow. Follow instructions from your health care provider about eating or drinking restrictions. Resume your normal diet as told by your health care provider. Avoid heavy or fried foods that are hard to digest. Activity Rest as told by your health care provider. Avoid sitting for a long time without moving. Get up to take short walks every 1-2 hours. This is important to improve blood flow and  breathing. Ask for help if you feel weak or unsteady. Return to your normal activities as told by your health care provider. Ask your health care provider what activities are safe for you. Managing cramping and bloating  Try walking around when you have cramps or feel bloated. If directed, apply heat to your abdomen as told by your health care provider. Use the heat source that your health care provider recommends, such as a moist heat pack or a heating pad. Place a towel between your skin and the heat source. Leave the heat on for 20-30 minutes. Remove the heat if your skin turns bright red. This is especially important if you are unable to feel pain, heat, or cold. You have a greater risk of getting burned. General instructions If you were given a sedative during the procedure, it can affect you for several hours. Do not drive or operate machinery until  your health care provider says that it is safe. For the first 24 hours after the procedure: Do not sign important documents. Do not drink alcohol. Do your regular daily activities at a slower pace than normal. Eat soft foods that are easy to digest. Take over-the-counter and prescription medicines only as told by your health care provider. Keep all follow-up visits. This is important. Contact a health care provider if: You have blood in your stool 2-3 days after the procedure. Get help right away if: You have more than a small spotting of blood in your stool. You have large blood clots in your stool. You have swelling of your abdomen. You have nausea or vomiting. You have a fever. You have increasing pain in your abdomen that is not relieved with medicine. These symptoms may be an emergency. Get help right away. Call 911. Do not wait to see if the symptoms will go away. Do not drive yourself to the hospital. Summary After the procedure, it is common to have a small amount of blood in your stool. You may also have mild cramping and  bloating of your abdomen. If you were given a sedative during the procedure, it can affect you for several hours. Do not drive or operate machinery until your health care provider says that it is safe. Get help right away if you have a lot of blood in your stool, nausea or vomiting, a fever, or increased pain in your abdomen. This information is not intended to replace advice given to you by your health care provider. Make sure you discuss any questions you have with your health care provider. Document Revised: 12/15/2020 Document Reviewed: 12/15/2020 Elsevier Patient Education  [place] After This sheet gives you information about how to care for yourself after your procedure. Your health care provider may also give you more specific instructions. If you have problems or questions, contact your health care provider. What can I expect after the procedure? After the procedure, it is common to have: Tiredness. Forgetfulness about what happened after the procedure. Impaired judgment for important decisions. Nausea or vomiting. Some difficulty with balance. Follow these instructions at home: For the time period you were told by your health care provider:     Rest as needed. Do not participate in activities where you could fall or become injured. Do not drive or use machinery. Do not drink alcohol. Do not take sleeping pills or medicines that cause drowsiness. Do not make important decisions or sign legal documents. Do not take care of children on your own. Eating and drinking Follow the diet that is recommended by your health care provider. Drink enough fluid to keep your urine pale yellow. If you vomit: Drink water, juice, or soup when you can drink without vomiting. Make sure you have little or no nausea before eating solid foods. General instructions Have a responsible adult stay with you for the time you are told. It is important to have  someone help care for you until you are awake and alert. Take over-the-counter and prescription medicines only as told by your health care provider. If you have sleep apnea, surgery and certain medicines can increase your risk for breathing problems. Follow instructions from your health care provider about wearing your sleep device: Anytime you are sleeping, including during daytime naps. While taking prescription pain medicines, sleeping medicines, or medicines that make you drowsy. Avoid smoking. Keep all follow-up visits as told by your health care provider. This  is important. Contact a health care provider if: You keep feeling nauseous or you keep vomiting. You feel light-headed. You are still sleepy or having trouble with balance after 24 hours. You develop a rash. You have a fever. You have redness or swelling around the IV site. Get help right away if: You have trouble breathing. You have new-onset confusion at home. Summary For several hours after your procedure, you may feel tired. You may also be forgetful and have poor judgment. Have a responsible adult stay with you for the time you are told. It is important to have someone help care for you until you are awake and alert. Rest as told. Do not drive or operate machinery. Do not drink alcohol or take sleeping pills. Get help right away if you have trouble breathing, or if you suddenly become confused. This information is not intended to replace advice given to you by your health care provider. Make sure you discuss any questions you have with your health care provider. Document Revised: 03/29/2021 Document Reviewed: 03/27/2019 Elsevier Patient Education  [place].

## 2021-10-13 ENCOUNTER — Encounter (HOSPITAL_COMMUNITY)
Admission: RE | Admit: 2021-10-13 | Discharge: 2021-10-13 | Disposition: A | Discharge status: Home / Self Care | Payer: No Typology Code available for payment source | Source: Ambulatory Visit | Attending: Internal Medicine | Admitting: Internal Medicine

## 2021-10-13 ENCOUNTER — Encounter (HOSPITAL_COMMUNITY): Payer: Self-pay

## 2021-10-13 ENCOUNTER — Other Ambulatory Visit: Payer: Self-pay

## 2021-10-17 ENCOUNTER — Encounter (HOSPITAL_COMMUNITY)
Admission: RE | Disposition: A | Discharge status: Home / Self Care | Payer: Self-pay | Source: Home / Self Care | Attending: Internal Medicine

## 2021-10-17 ENCOUNTER — Ambulatory Visit (HOSPITAL_BASED_OUTPATIENT_CLINIC_OR_DEPARTMENT_OTHER): Payer: No Typology Code available for payment source | Admitting: Anesthesiology

## 2021-10-17 ENCOUNTER — Ambulatory Visit (HOSPITAL_COMMUNITY): Payer: No Typology Code available for payment source | Admitting: Anesthesiology

## 2021-10-17 ENCOUNTER — Encounter (HOSPITAL_COMMUNITY): Payer: Self-pay

## 2021-10-17 ENCOUNTER — Ambulatory Visit (HOSPITAL_COMMUNITY)
Admission: RE | Admit: 2021-10-17 | Discharge: 2021-10-17 | Disposition: A | Discharge status: Home / Self Care | Payer: No Typology Code available for payment source | Attending: Internal Medicine | Admitting: Internal Medicine

## 2021-10-17 DIAGNOSIS — R1310 Dysphagia, unspecified: Secondary | ICD-10-CM | POA: Diagnosis not present

## 2021-10-17 DIAGNOSIS — Z09 Encounter for follow-up examination after completed treatment for conditions other than malignant neoplasm: Secondary | ICD-10-CM | POA: Diagnosis not present

## 2021-10-17 DIAGNOSIS — K5730 Diverticulosis of large intestine without perforation or abscess without bleeding: Secondary | ICD-10-CM | POA: Insufficient documentation

## 2021-10-17 DIAGNOSIS — Z86010 Personal history of colonic polyps: Secondary | ICD-10-CM | POA: Insufficient documentation

## 2021-10-17 DIAGNOSIS — K635 Polyp of colon: Secondary | ICD-10-CM

## 2021-10-17 DIAGNOSIS — D120 Benign neoplasm of cecum: Secondary | ICD-10-CM | POA: Diagnosis not present

## 2021-10-17 DIAGNOSIS — K648 Other hemorrhoids: Secondary | ICD-10-CM | POA: Insufficient documentation

## 2021-10-17 DIAGNOSIS — Z8673 Personal history of transient ischemic attack (TIA), and cerebral infarction without residual deficits: Secondary | ICD-10-CM | POA: Insufficient documentation

## 2021-10-17 DIAGNOSIS — E039 Hypothyroidism, unspecified: Secondary | ICD-10-CM | POA: Diagnosis not present

## 2021-10-17 DIAGNOSIS — K2970 Gastritis, unspecified, without bleeding: Secondary | ICD-10-CM | POA: Diagnosis not present

## 2021-10-17 DIAGNOSIS — G709 Myoneural disorder, unspecified: Secondary | ICD-10-CM | POA: Diagnosis not present

## 2021-10-17 DIAGNOSIS — Z79899 Other long term (current) drug therapy: Secondary | ICD-10-CM | POA: Insufficient documentation

## 2021-10-17 DIAGNOSIS — G4730 Sleep apnea, unspecified: Secondary | ICD-10-CM | POA: Diagnosis not present

## 2021-10-17 DIAGNOSIS — Z1211 Encounter for screening for malignant neoplasm of colon: Secondary | ICD-10-CM | POA: Insufficient documentation

## 2021-10-17 DIAGNOSIS — Z87891 Personal history of nicotine dependence: Secondary | ICD-10-CM | POA: Diagnosis not present

## 2021-10-17 DIAGNOSIS — I10 Essential (primary) hypertension: Secondary | ICD-10-CM | POA: Diagnosis not present

## 2021-10-17 DIAGNOSIS — Z8711 Personal history of peptic ulcer disease: Secondary | ICD-10-CM | POA: Insufficient documentation

## 2021-10-17 DIAGNOSIS — K219 Gastro-esophageal reflux disease without esophagitis: Secondary | ICD-10-CM | POA: Insufficient documentation

## 2021-10-17 DIAGNOSIS — D122 Benign neoplasm of ascending colon: Secondary | ICD-10-CM

## 2021-10-17 HISTORY — PX: COLONOSCOPY WITH PROPOFOL: SHX5780

## 2021-10-17 HISTORY — PX: POLYPECTOMY: SHX5525

## 2021-10-17 HISTORY — PX: ESOPHAGOGASTRODUODENOSCOPY (EGD) WITH PROPOFOL: SHX5813

## 2021-10-17 HISTORY — PX: BIOPSY: SHX5522

## 2021-10-17 SURGERY — COLONOSCOPY WITH PROPOFOL
Anesthesia: General

## 2021-10-17 MED ORDER — LACTATED RINGERS IV SOLN
INTRAVENOUS | Status: DC
  Administered 2021-10-17: 1000 mL via INTRAVENOUS

## 2021-10-17 MED ORDER — PROPOFOL 500 MG/50ML IV EMUL
INTRAVENOUS | Status: DC | PRN
  Administered 2021-10-17: 150 ug/kg/min via INTRAVENOUS

## 2021-10-17 MED ORDER — LIDOCAINE HCL (CARDIAC) PF 100 MG/5ML IV SOSY
PREFILLED_SYRINGE | INTRAVENOUS | Status: DC | PRN
  Administered 2021-10-17: 100 mg via INTRAVENOUS

## 2021-10-17 MED ORDER — OMEPRAZOLE 40 MG PO CPDR
40.0000 mg | DELAYED_RELEASE_CAPSULE | Freq: Two times a day (BID) | ORAL | 5 refills | Status: DC
Start: 2021-10-17 — End: 2024-03-03

## 2021-10-17 MED ORDER — PROPOFOL 10 MG/ML IV BOLUS
INTRAVENOUS | Status: DC | PRN
  Administered 2021-10-17: 100 mg via INTRAVENOUS

## 2021-10-17 NOTE — Transfer of Care (Signed)
Immediate Anesthesia Transfer of Care Note  Patient: Andrew Love  Procedure(s) Performed: COLONOSCOPY WITH PROPOFOL ESOPHAGOGASTRODUODENOSCOPY (EGD) WITH PROPOFOL BIOPSY POLYPECTOMY  Patient Location: Short Stay  Anesthesia Type:General  Level of Consciousness: awake  Airway & Oxygen Therapy: Patient Spontanous Breathing  Post-op Assessment: Report given to RN and Post -op Vital signs reviewed and stable  Post vital signs: Reviewed and stable  Last Vitals:  Vitals Value Taken Time  BP    Temp    Pulse    Resp    SpO2      Last Pain:  Vitals:   10/17/21 0858  TempSrc:   PainSc: 0-No pain      Patients Stated Pain Goal: 4 (03/08/27 1188)  Complications: No notable events documented.

## 2021-10-17 NOTE — Discharge Instructions (Addendum)
EGD Discharge instructions Please read the instructions outlined below and refer to this sheet in the next few weeks. These discharge instructions provide you with general information on caring for yourself after you leave the hospital. Your doctor may also give you specific instructions. While your treatment has been planned according to the most current medical practices available, unavoidable complications occasionally occur. If you have any problems or questions after discharge, please call your doctor. ACTIVITY You may resume your regular activity but move at a slower pace for the next 24 hours.  Take frequent rest periods for the next 24 hours.  Walking will help expel (get rid of) the air and reduce the bloated feeling in your abdomen.  No driving for 24 hours (because of the anesthesia (medicine) used during the test).  You may shower.  Do not sign any important legal documents or operate any machinery for 24 hours (because of the anesthesia used during the test).  NUTRITION Drink plenty of fluids.  You may resume your normal diet.  Begin with a light meal and progress to your normal diet.  Avoid alcoholic beverages for 24 hours or as instructed by your caregiver.  MEDICATIONS You may resume your normal medications unless your caregiver tells you otherwise.  WHAT YOU CAN EXPECT TODAY You may experience abdominal discomfort such as a feeling of fullness or "gas" pains.  FOLLOW-UP Your doctor will discuss the results of your test with you.  SEEK IMMEDIATE MEDICAL ATTENTION IF ANY OF THE FOLLOWING OCCUR: Excessive nausea (feeling sick to your stomach) and/or vomiting.  Severe abdominal pain and distention (swelling).  Trouble swallowing.  Temperature over 101 F (37.8 C).  Rectal bleeding or vomiting of blood.     Colonoscopy Discharge Instructions  Read the instructions outlined below and refer to this sheet in the next few weeks. These discharge instructions provide you with  general information on caring for yourself after you leave the hospital. Your doctor may also give you specific instructions. While your treatment has been planned according to the most current medical practices available, unavoidable complications occasionally occur.   ACTIVITY You may resume your regular activity, but move at a slower pace for the next 24 hours.  Take frequent rest periods for the next 24 hours.  Walking will help get rid of the air and reduce the bloated feeling in your belly (abdomen).  No driving for 24 hours (because of the medicine (anesthesia) used during the test).   Do not sign any important legal documents or operate any machinery for 24 hours (because of the anesthesia used during the test).  NUTRITION Drink plenty of fluids.  You may resume your normal diet as instructed by your doctor.  Begin with a light meal and progress to your normal diet. Heavy or fried foods are harder to digest and may make you feel sick to your stomach (nauseated).  Avoid alcoholic beverages for 24 hours or as instructed.  MEDICATIONS You may resume your normal medications unless your doctor tells you otherwise.  WHAT YOU CAN EXPECT TODAY Some feelings of bloating in the abdomen.  Passage of more gas than usual.  Spotting of blood in your stool or on the toilet paper.  IF YOU HAD POLYPS REMOVED DURING THE COLONOSCOPY: No aspirin products for 7 days or as instructed.  No alcohol for 7 days or as instructed.  Eat a soft diet for the next 24 hours.  FINDING OUT THE RESULTS OF YOUR TEST Not all test results are  available during your visit. If your test results are not back during the visit, make an appointment with your caregiver to find out the results. Do not assume everything is normal if you have not heard from your caregiver or the medical facility. It is important for you to follow up on all of your test results.  SEEK IMMEDIATE MEDICAL ATTENTION IF: You have more than a spotting of  blood in your stool.  Your belly is swollen (abdominal distention).  You are nauseated or vomiting.  You have a temperature over 101.  You have abdominal pain or discomfort that is severe or gets worse throughout the day.   Your EGD revealed mild amount inflammation in your stomach.  I took biopsies of this to rule out infection with a bacteria called H. pylori.  Await pathology results, my office will contact you. I am going to switch your Pantoprazole to Omeprazole 40 mg twice daily x12 weeks at which point you can go down to once daily.   Your colonoscopy revealed 2 polyp(s) which I removed successfully. Await pathology results, my office will contact you. I recommend repeating colonoscopy in 5 years for surveillance purposes.   You also have diverticulosis and internal hemorrhoids. I would recommend increasing fiber in your diet or adding OTC Benefiber/Metamucil. Be sure to drink at least 4 to 6 glasses of water daily. Follow-up with GI as needed.    I hope you have a great rest of your week!  Elon Alas. Abbey Chatters, D.O. Gastroenterology and Hepatology [facility]

## 2021-10-17 NOTE — Op Note (Signed)
[facility] Patient Name: Andrew Love Procedure Date: 10/17/2021 8:43 AM MRN: [id number] Date of Birth: 1965/09/24 Attending MD: Elon Alas. Abbey Chatters DO CSN: [id number] Age: 56 Admit Type: Outpatient Procedure:                Colonoscopy Indications:              Surveillance: Personal history of adenomatous                            polyps on last colonoscopy > 5 years ago Providers:                Elon Alas. Abbey Chatters, DO, Jessica Boudreaux, [place]                            Page, [place] Risa Grill, Technician Referring MD:              Medicines:                See the Anesthesia note for documentation of the                            administered medications Complications:            No immediate complications. Estimated Blood Loss:     Estimated blood loss was minimal. Procedure:                Pre-Anesthesia Assessment:                           - The anesthesia plan was to use monitored                            anesthesia care (MAC).                           After obtaining informed consent, the colonoscope                            was passed under direct vision. Throughout the                            procedure, the patient's blood pressure, pulse, and                            oxygen saturations were monitored continuously. The                            [id number]-HQ190L ([id number]) scope was introduced through                            the anus and advanced to the the cecum, identified                            by appendiceal orifice and ileocecal valve. The                            colonoscopy  was performed without difficulty. The                            patient tolerated the procedure well. The quality                            of the bowel preparation was evaluated using the                            BBPS [facility] Bowel Preparation Scale) with scores                            of: Right Colon = 3, Transverse Colon = 3 and Left                             Colon = 3 (entire mucosa seen well with no residual                            staining, small fragments of stool or opaque                            liquid). The total BBPS score equals 9. Scope In: 8:44:55 AM Scope Out: 8:58:05 AM Scope Withdrawal Time: 0 hours 8 minutes 56 seconds  Total Procedure Duration: 0 hours 13 minutes 10 seconds  Findings:      The perianal and digital rectal examinations were normal.      Non-bleeding internal hemorrhoids were found during endoscopy.      Multiple small-mouthed diverticula were found in the sigmoid colon.      Two sessile polyps were found in the ascending colon and cecum. The       polyps were 5 to 7 mm in size. These polyps were removed with a cold       snare. Resection and retrieval were complete.      The exam was otherwise without abnormality. Impression:               - Non-bleeding internal hemorrhoids.                           - Diverticulosis in the sigmoid colon.                           - Two 5 to 7 mm polyps in the ascending colon and                            in the cecum, removed with a cold snare. Resected                            and retrieved.                           - The examination was otherwise normal. Moderate Sedation:      Per Anesthesia Care Recommendation:           - Patient has a contact number available for  emergencies. The signs and symptoms of potential                            delayed complications were discussed with the                            patient. Return to normal activities tomorrow.                            Written discharge instructions were provided to the                            patient.                           - Resume previous diet.                           - Continue present medications.                           - Await pathology results.                           - Repeat colonoscopy in 5 years for surveillance.                           -  Return to GI clinic PRN. Procedure Code(s):        --- Professional ---                           [phone], Colonoscopy, flexible; with removal of                            tumor(s), polyp(s), or other lesion(s) by snare                            technique Diagnosis Code(s):        --- Professional ---                           Z86.010, Personal history of colonic polyps                           K64.8, Other hemorrhoids                           K63.5, Polyp of colon                           K57.30, Diverticulosis of large intestine without                            perforation or abscess without bleeding CPT copyright 2019 American Medical Association. All rights reserved. The codes documented in this report are preliminary and upon coder review may  be revised to meet current compliance requirements. Elon Alas. Abbey Chatters, DO Juanda Crumble  Arlee Muslim, DO 10/17/2021 9:01:13 AM This report has been signed electronically. Number of Addenda: 0

## 2021-10-17 NOTE — H&P (Signed)
Primary Care Physician:  Wyline Beady, PA-C Primary Gastroenterologist:  Dr. Abbey Chatters  Pre-Procedure History & Physical: HPI:  Andrew Love is a 56 y.o. male is here for an EGD with possible dilation due to history of GERD/Dysphagia and colonoscopy to be performed for surveillance purposes/personal history of adenomatous colon polyps.   Past Medical History:  Diagnosis Date   Arthritis    CAD (coronary artery disease)    a. Minor disease with suspected microvascular angina 2009 - [facility] b. cath in 05/2021 showing 95% distal RV branch stenosis collateralized by the left system with medical management recommended given small vessel size.   Chronic back pain    Chronic left hip pain    Colitis    Per medical history from dated 06/13/10.   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   Sigmoid colon ulcer    Rectal polyps   Sleep apnea with use of continuous positive airway pressure (CPAP)    Urticaria     Past Surgical History:  Procedure Laterality Date   APPENDECTOMY     8th grade   BACK SURGERY     CARDIAC CATHETERIZATION  2009   CARDIAC CATHETERIZATION     Per medical history from dated 06/13/10.   CERVICAL SPINE SURGERY     C4, C5, C6 spinal fusion   CHOLECYSTECTOMY N/A 08/31/2015   Procedure: LAPAROSCOPIC CHOLECYSTECTOMY WITH INTRAOPERATIVE CHOLANGIOGRAM;  Surgeon: Excell Seltzer, MD;  Location: [facility];  Service: General;  Laterality: N/A;   COLONOSCOPY  07/2008   Colitis,NSAID v. Ischemia,malrotation of the gut,Diverticulosis(L),hyperplastic   COLONOSCOPY N/A 12/13/2012   Dr. Oneida Alar: Normal TI, mild sigmoid diverticulosis, hemorrhoids, 2 polyps (tubular adenoma). Random colon bx negative. Next TCS 12/2022  with Fentanyl/phenergan   ESOPHAGOGASTRODUODENOSCOPY N/A 04/16/2014   RMR: Erosive reflux esophagitis. Non critical Schzki's ring not manipulated. Small hiatal hernia. Abnormal gastirc mucosa of doubtful signigicance status post biopsy. I suspect trivial upper GI bleed. Recent abdominal pain presumably secondary to a protracted bout of diverticulitis. CT scan November 23 revealed improvemetn without complication. He finished his antibiotics 2 days age. Left sided ab   JOINT REPLACEMENT     LAPAROSCOPIC SIGMOID COLECTOMY  2012   Dr. Fanny Skates: recurrent sigmoid diverticulitis   LAPAROSCOPIC SIGMOID COLECTOMY  2012   recurernt sigmoid diverticulitis, Dr. Dalbert Batman    LEFT HEART CATH AND CORONARY ANGIOGRAPHY N/A 06/03/2021   Procedure: LEFT HEART CATH AND CORONARY ANGIOGRAPHY;  Surgeon: Early Osmond, MD;  Location: [place] [facility];  Service: Cardiovascular;  Laterality: N/A;   ROTATOR CUFF REPAIR     Left - per medical history form dated 06/13/10.   SHOULDER SURGERY     Left   THYROIDECTOMY     Per medical history form dated 06/13/10.   TOTAL HIP ARTHROPLASTY  07/04/2011   Procedure: TOTAL HIP ARTHROPLASTY;  Surgeon: Johnny Bridge, MD;  Location: [place];  Service: Orthopedics;  Laterality: Right;   TOTAL HIP ARTHROPLASTY Left 07/22/2019   Procedure: TOTAL HIP ARTHROPLASTY;  Surgeon: Marchia Bond, MD;  Location: [facility];  Service: Orthopedics;  Laterality: Left;    Prior to Admission medications   Medication Sig Start  Date End Date Taking? Authorizing Provider  acetaminophen (TYLENOL) 500 MG tablet Take 500-1,000 mg by mouth every 6 (six) hours as needed for mild pain or fever.   Yes [provider]  aspirin EC 81 MG EC tablet Take 1 tablet (81 mg total) by mouth daily. Swallow whole. 06/05/21  Yes Hosie Poisson, MD  baclofen (LIORESAL) 10 MG tablet Take 1 tablet (10 mg total) by mouth 3 (three) times daily. As needed for muscle spasm Patient taking differently: Take 10 mg by mouth  3 (three) times daily as needed for muscle spasms. 07/23/19  Yes Merlene Pulling K, PA-C  cholecalciferol (VITAMIN D3) 25 MCG (1000 UNIT) tablet Take 1,000 Units by mouth at bedtime.   Yes [provider]  diphenhydrAMINE (BENADRYL) 25 MG tablet Take 25-50 mg by mouth every 6 (six) hours as needed for allergies.   Yes [provider]  isosorbide mononitrate (IMDUR) 30 MG 24 hr tablet Take 1 tablet (30 mg total) by mouth daily. 10/06/21 10/01/22 Yes Strader, Fransisco Hertz, PA-C  levothyroxine (SYNTHROID, LEVOTHROID) 300 MCG tablet Take 300 mcg by mouth every morning.  03/26/14  Yes [provider]  losartan (COZAAR) 100 MG tablet Take 100 mg by mouth daily. 09/26/20  Yes [provider]  Omega-3 Fatty Acids (FISH OIL) 1000 MG CAPS Take 1,000 mg by mouth daily.   Yes [provider]  ondansetron (ZOFRAN) 4 MG tablet Take 1 tablet (4 mg total) by mouth every 6 (six) hours. Patient taking differently: Take 4 mg by mouth every 8 (eight) hours as needed for nausea or vomiting. 01/04/20  Yes Noemi Chapel, MD  pantoprazole (PROTONIX) 40 MG tablet Take 1 tablet (40 mg total) by mouth daily. 06/04/21  Yes Hosie Poisson, MD  rosuvastatin (CRESTOR) 5 MG tablet Take 1 tablet (5 mg total) by mouth once a week. 10/06/21 11/30/22 Yes Strader, Fransisco Hertz, PA-C  Tetrahydroz-Dextran-PEG-Povid (VISINE ADVANCED RELIEF) 0.05-0.1-1-1 % SOLN Place 1 drop into both eyes daily as needed (dry eyes).   Yes [provider]  zolpidem (AMBIEN) 10 MG tablet Take 10 mg by mouth at bedtime.   Yes [provider]  EPINEPHrine (EPIPEN) 0.3 mg/0.3 mL SOAJ Inject 0.3 mLs (0.3 mg total) into the muscle as needed. Patient taking differently: Inject 0.3 mg into the muscle as needed for anaphylaxis. 12/01/12   Teressa Lower, MD  ibuprofen (ADVIL) 200 MG tablet Take 800 mg by mouth every 8 (eight) hours as needed for moderate pain.    [provider]  meclizine (ANTIVERT) 25 MG tablet  Take 1 tablet (25 mg total) by mouth 3 (three) times daily as needed for dizziness. 04/09/17   Isla Pence, MD  nitroGLYCERIN (NITROSTAT) 0.4 MG SL tablet Place 1 tablet (0.4 mg total) under the tongue every 5 (five) minutes as needed for chest pain. Up to 3 tablets then call 911 or go to the nearest ED. 10/06/21 01/04/22  Ahmed Prima, Fransisco Hertz, PA-C  polyethylene glycol-electrolytes (TRILYTE) 420 g solution Take 4,000 mLs by mouth as directed. 08/10/21   Eloise Harman, DO  promethazine (PHENERGAN) 25 MG tablet Take 1 tablet (25 mg total) by mouth every 6 (six) hours as needed for nausea or vomiting. 04/10/19   Jacqlyn Larsen, PA-C    Allergies as of 08/10/2021 - Review Complete 08/03/2021  Allergen Reaction Noted   Dust mite mixed allergen ext [mite (d. farinae)] Anaphylaxis and Hives 08/06/2019   Other Hives and Shortness Of Breath 01/08/2013   Iohexol Hives  03/31/2014   Almond (diagnostic) Hives 08/06/2019   Oysters [shellfish allergy] Hives 06/19/2016   Wheat bran Hives 06/19/2016   Yeast-related products Hives 07/04/2016    Family History  Problem Relation Age of Onset   Cancer Mother        breast cancer - per medical history form dated 06/13/10.   Diverticulitis Mother    Hypertension Mother    Cancer Father        skin - per medical history form dated 06/13/10.   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   Married   Social Determinants of Health   Financial Resource Strain: Not on file  Food Insecurity: Not on file  Transportation Needs: Not on file  Physical Activity: Not on file  Stress: Not on file  Social Connections: Not on file  Intimate Partner Violence: Not on file    Review of Systems: See HPI, otherwise negative ROS  Physical Exam: Vital signs in last 24 hours: Temp:  [98.1 F (36.7 C)] 98.1 F (36.7 C) (06/12 0802) Pulse Rate:  [74] 74 (06/12 0802) Resp:  [14] 14 (06/12 0802) BP: (133)/(91) 133/91 (06/12 0802) SpO2:  [97 %] 97 % (06/12 0802) Weight:  [107.5 kg] 107.5 kg (06/12 0802)   General:   Alert,  Well-developed, well-nourished, pleasant and cooperative in NAD Head:  Normocephalic and atraumatic. Eyes:  Sclera clear, no icterus.   Conjunctiva pink. Ears:  Normal auditory acuity. Nose:  No deformity, discharge,  or lesions. Mouth:  No deformity or lesions, dentition normal. Neck:  Supple; no masses or thyromegaly. Lungs:  Clear throughout to auscultation.   No wheezes, crackles, or rhonchi. No acute distress. Heart:  Regular rate and rhythm; no murmurs, clicks, rubs,  or gallops. Abdomen:  Soft, nontender and nondistended. No masses, hepatosplenomegaly or hernias noted. Normal bowel sounds, without guarding, and without rebound.   Msk:  Symmetrical without gross deformities. Normal posture. Extremities:  Without clubbing or edema. Neurologic:  Alert and  oriented x4;  grossly normal neurologically. Skin:  Intact without significant lesions or rashes. Cervical Nodes:  No significant cervical adenopathy. Psych:  Alert and  cooperative. Normal mood and affect.  Impression/Plan: CHARLOTTE BRAFFORD is here for an EGD with possible dilation due to history of GERD/Dysphagia and colonoscopy to be performed for surveillance  purposes/personal history of adenomatous colon polyps.   The risks of the procedure including infection, bleed, or perforation as well as benefits, limitations, alternatives and imponderables have been reviewed with the patient. Questions have been answered. All parties agreeable.

## 2021-10-17 NOTE — Anesthesia Preprocedure Evaluation (Signed)
Anesthesia Evaluation  Patient identified by MRN, date of birth, ID band Patient awake    Reviewed: Allergy & Precautions, H&P , NPO status , Patient's Chart, lab work & pertinent test results, reviewed documented beta blocker date and time   Airway Mallampati: II  TM Distance: >3 FB Neck ROM: full    Dental no notable dental hx.    Pulmonary sleep apnea , former smoker,    Pulmonary exam normal breath sounds clear to auscultation       Cardiovascular Exercise Tolerance: Good hypertension, negative cardio ROS   Rhythm:regular Rate:Normal     Neuro/Psych TIA Neuromuscular disease negative psych ROS   GI/Hepatic Neg liver ROS, PUD, GERD  Medicated,  Endo/Other  Hypothyroidism   Renal/GU negative Renal ROS  negative genitourinary   Musculoskeletal   Abdominal   Peds  Hematology negative hematology ROS (+)   Anesthesia Other Findings .  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 .  LV end diastolic pressure is normal. .  The left ventricular ejection fraction is 55-65% by visual estimate.  1.  Distal RV marginal disease that is collateralized by the left system; medical therapy should be pursued as this is a relatively small vessel with otherwise mild obstructive disease elsewhere. 2.  LVEDP of 13 mmHg with preserved left ventricular function.   Reproductive/Obstetrics negative OB ROS                             Anesthesia Physical Anesthesia Plan  ASA: 3  Anesthesia Plan: General   Post-op Pain Management:    Induction:   PONV Risk Score and Plan: Propofol infusion  Airway Management Planned:   Additional Equipment:   Intra-op Plan:   Post-operative Plan:   Informed Consent: I have reviewed the patients History and Physical, chart, labs and discussed the procedure including the risks, benefits and alternatives for the proposed anesthesia with the patient or authorized representative  who has indicated his/her understanding and acceptance.     Dental Advisory Given  Plan Discussed with: CRNA  Anesthesia Plan Comments:         Anesthesia Quick Evaluation

## 2021-10-17 NOTE — Anesthesia Postprocedure Evaluation (Signed)
Anesthesia Post Note  Patient: Andrew Love  Procedure(s) Performed: COLONOSCOPY WITH PROPOFOL ESOPHAGOGASTRODUODENOSCOPY (EGD) WITH PROPOFOL BIOPSY POLYPECTOMY  Patient location during evaluation: Phase II Anesthesia Type: General Level of consciousness: awake Pain management: pain level controlled Vital Signs Assessment: post-procedure vital signs reviewed and stable Respiratory status: spontaneous breathing and respiratory function stable Cardiovascular status: blood pressure returned to baseline and stable Postop Assessment: no headache and no apparent nausea or vomiting Anesthetic complications: no Comments: Late entry   No notable events documented.   Last Vitals:  Vitals:   10/17/21 0802 10/17/21 0858  BP: (!) 133/91 (!) 105/52  Pulse: 74 85  Resp: 14 16  Temp: 36.7 C   SpO2: 97% 95%    Last Pain:  Vitals:   10/17/21 0858  TempSrc:   PainSc: 0-No pain                 Louann Sjogren

## 2021-10-17 NOTE — Op Note (Signed)
[facility] Patient Name: Andrew Love Procedure Date: 10/17/2021 8:26 AM MRN: [id number] Date of Birth: 1965-07-10 Attending MD: Elon Alas. Abbey Chatters DO CSN: [id number] Age: 56 Admit Type: Outpatient Procedure:                Upper GI endoscopy Indications:              Dysphagia, Heartburn Providers:                Elon Alas. Abbey Chatters, DO, Jessica Boudreaux, [place]                            Page, [place] Risa Grill, Technician Referring MD:              Medicines:                See the Anesthesia note for documentation of the                            administered medications Complications:            No immediate complications. Estimated Blood Loss:     Estimated blood loss was minimal. Procedure:                Pre-Anesthesia Assessment:                           - The anesthesia plan was to use monitored                            anesthesia care (MAC).                           After obtaining informed consent, the endoscope was                            passed under direct vision. Throughout the                            procedure, the patient's blood pressure, pulse, and                            oxygen saturations were monitored continuously. The                            [id number]-[id number] ([id number]) scope was introduced through the                            mouth, and advanced to the second part of duodenum.                            The upper GI endoscopy was accomplished without                            difficulty. The patient tolerated the procedure                            well. Scope In:  8:38:02 AM Scope Out: 8:41:18 AM Total Procedure Duration: 0 hours 3 minutes 16 seconds  Findings:      There is no endoscopic evidence of bleeding, areas of erosion,       esophagitis, ulcerations or varices in the entire esophagus.      Patchy moderate inflammation characterized by erosions and erythema was       found in the entire examined stomach. Biopsies were  taken with a cold       forceps for Helicobacter pylori testing.      The duodenal bulb, first portion of the duodenum and second portion of       the duodenum were normal. Impression:               - Gastritis. Biopsied.                           - Normal duodenal bulb, first portion of the                            duodenum and second portion of the duodenum. Moderate Sedation:      Per Anesthesia Care Recommendation:           - Patient has a contact number available for                            emergencies. The signs and symptoms of potential                            delayed complications were discussed with the                            patient. Return to normal activities tomorrow.                            Written discharge instructions were provided to the                            patient.                           - Resume previous diet.                           - Continue present medications.                           - Await pathology results.                           - Use Prilosec (omeprazole) 40 mg PO BID for 12                            weeks.                           - No ibuprofen, naproxen, or other non-steroidal  anti-inflammatory drugs.                           - Return to GI clinic PRN. Procedure Code(s):        --- Professional ---                           [id number], Esophagogastroduodenoscopy, flexible,                            transoral; with biopsy, single or multiple Diagnosis Code(s):        --- Professional ---                           K29.70, Gastritis, unspecified, without bleeding                           R13.10, Dysphagia, unspecified                           R12, Heartburn CPT copyright 2019 American Medical Association. All rights reserved. The codes documented in this report are preliminary and upon coder review may  be revised to meet current compliance requirements. Elon Alas. Abbey Chatters, DO [place]  Abbey Chatters, DO 10/17/2021 8:43:14 AM This report has been signed electronically. Number of Addenda: 0

## 2021-10-17 NOTE — Anesthesia Procedure Notes (Signed)
Date/Time: 10/17/2021 8:40 AM  Performed by: Karna Dupes, CRNAPre-anesthesia Checklist: Patient identified, Emergency Drugs available, Suction available and Patient being monitored Patient Re-evaluated:Patient Re-evaluated prior to induction Oxygen Delivery Method: Nasal cannula Induction Type: IV induction Placement Confirmation: positive ETCO2

## 2021-10-19 LAB — SURGICAL PATHOLOGY

## 2021-10-21 ENCOUNTER — Encounter (HOSPITAL_COMMUNITY): Payer: Self-pay | Admitting: Internal Medicine

## 2021-11-07 ENCOUNTER — Encounter: Payer: Self-pay | Admitting: *Deleted

## 2021-11-07 DIAGNOSIS — Z006 Encounter for examination for normal comparison and control in clinical research program: Secondary | ICD-10-CM

## 2021-11-07 NOTE — Research (Signed)
Message left for Mr Henner about [employer] research study. Encouraged him to call me back.

## 2021-12-09 ENCOUNTER — Encounter (HOSPITAL_COMMUNITY): Payer: Self-pay | Admitting: Emergency Medicine

## 2021-12-09 ENCOUNTER — Emergency Department (HOSPITAL_COMMUNITY): Payer: 59

## 2021-12-09 ENCOUNTER — Other Ambulatory Visit: Payer: Self-pay

## 2021-12-09 ENCOUNTER — Emergency Department (HOSPITAL_COMMUNITY)
Admission: EM | Admit: 2021-12-09 | Discharge: 2021-12-10 | Disposition: A | Discharge status: Home / Self Care | Payer: 59 | Attending: Emergency Medicine | Admitting: Emergency Medicine

## 2021-12-09 DIAGNOSIS — I10 Essential (primary) hypertension: Secondary | ICD-10-CM | POA: Insufficient documentation

## 2021-12-09 DIAGNOSIS — S79912A Unspecified injury of left hip, initial encounter: Secondary | ICD-10-CM | POA: Diagnosis present

## 2021-12-09 DIAGNOSIS — S7002XA Contusion of left hip, initial encounter: Secondary | ICD-10-CM | POA: Insufficient documentation

## 2021-12-09 DIAGNOSIS — Z7982 Long term (current) use of aspirin: Secondary | ICD-10-CM | POA: Insufficient documentation

## 2021-12-09 DIAGNOSIS — W5512XA Struck by horse, initial encounter: Secondary | ICD-10-CM | POA: Insufficient documentation

## 2021-12-09 DIAGNOSIS — E119 Type 2 diabetes mellitus without complications: Secondary | ICD-10-CM | POA: Diagnosis not present

## 2021-12-09 DIAGNOSIS — Z79899 Other long term (current) drug therapy: Secondary | ICD-10-CM | POA: Diagnosis not present

## 2021-12-09 DIAGNOSIS — S39012A Strain of muscle, fascia and tendon of lower back, initial encounter: Secondary | ICD-10-CM | POA: Diagnosis not present

## 2021-12-09 LAB — CBC WITH DIFFERENTIAL/PLATELET
Abs Immature Granulocytes: 0.04 10*3/uL (ref 0.00–0.07)
Basophils Absolute: 0.1 10*3/uL (ref 0.0–0.1)
Basophils Relative: 1 %
Eosinophils Absolute: 0.2 10*3/uL (ref 0.0–0.5)
Eosinophils Relative: 2 %
HCT: 40.6 % (ref 39.0–52.0)
Hemoglobin: 15 g/dL (ref 13.0–17.0)
Immature Granulocytes: 1 %
Lymphocytes Relative: 21 %
Lymphs Abs: 1.8 10*3/uL (ref 0.7–4.0)
MCH: 32.4 pg (ref 26.0–34.0)
MCHC: 36.9 g/dL — ABNORMAL HIGH (ref 30.0–36.0)
MCV: 87.7 fL (ref 80.0–100.0)
Monocytes Absolute: 0.7 10*3/uL (ref 0.1–1.0)
Monocytes Relative: 7 %
Neutro Abs: 6 10*3/uL (ref 1.7–7.7)
Neutrophils Relative %: 68 %
Platelets: 244 10*3/uL (ref 150–400)
RBC: 4.63 MIL/uL (ref 4.22–5.81)
RDW: 11.6 % (ref 11.5–15.5)
WBC: 8.8 10*3/uL (ref 4.0–10.5)
nRBC: 0 % (ref 0.0–0.2)

## 2021-12-09 LAB — BASIC METABOLIC PANEL
Anion gap: 9 (ref 5–15)
BUN: 13 mg/dL (ref 6–20)
CO2: 23 mmol/L (ref 22–32)
Calcium: 8.8 mg/dL — ABNORMAL LOW (ref 8.9–10.3)
Chloride: 104 mmol/L (ref 98–111)
Creatinine, Ser: 1.29 mg/dL — ABNORMAL HIGH (ref 0.61–1.24)
GFR, Estimated: >60 mL/min (ref >60)
Glucose, Bld: 153 mg/dL — ABNORMAL HIGH (ref 70–99)
Potassium: 3.4 mmol/L — ABNORMAL LOW (ref 3.5–5.1)
Sodium: 136 mmol/L (ref 135–145)

## 2021-12-09 MED ORDER — HYDROMORPHONE HCL 1 MG/ML IJ SOLN
1.0000 mg | Freq: Once | INTRAMUSCULAR | Status: AC
  Administered 2021-12-09: 1 mg via INTRAVENOUS
  Filled 2021-12-09: qty 1

## 2021-12-09 MED ORDER — KETOROLAC TROMETHAMINE 30 MG/ML IJ SOLN
30.0000 mg | Freq: Once | INTRAMUSCULAR | Status: AC
  Administered 2021-12-09: 30 mg via INTRAVENOUS
  Filled 2021-12-09: qty 1

## 2021-12-09 MED ORDER — METHOCARBAMOL 500 MG PO TABS
1000.0000 mg | ORAL_TABLET | Freq: Once | ORAL | Status: AC
  Administered 2021-12-09: 1000 mg via ORAL
  Filled 2021-12-09: qty 2

## 2021-12-09 NOTE — ED Provider Notes (Signed)
[facility] EMERGENCY DEPARTMENT Provider Note   CSN: [id number] Arrival date & time: 12/09/21  2056     History  Chief Complaint  Patient presents with   Andrew Love    GINO GARRABRANT is a 56 y.o. male.   Fall   This patient is a 56 year old male, he has a history of multiple surgeries including bilateral hip replacements, he has prediabetes, hypertension, high cholesterol and takes medications for these things.  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  The patient in deformity and was unable to get up off of the ground.  He states that ever since the surgery occurred 4 years ago he has had some degree of pain in the hip and was scheduled for an MRI recently but it is not yet been done.  Tonight after the fall it became acutely worse.  The patient denies injuring his head neck or either of his arms.  He was not able to ambulate and comes by paramedic transport.  The paramedics gave him 50 mcg of fentanyl prehospital    Home Medications Prior to Admission medications   Medication Sig Start Date End Date Taking? Authorizing Provider  acetaminophen (TYLENOL) 500 MG tablet Take 500-1,000 mg by mouth every 6 (six) hours as needed for mild pain or fever.    [provider]  aspirin EC 81 MG EC tablet Take 1 tablet (81 mg total) by mouth daily. Swallow whole. 06/05/21   Hosie Poisson, MD  baclofen (LIORESAL) 10 MG tablet Take 1 tablet (10 mg total) by mouth 3 (three) times daily. As needed for muscle spasm Patient taking differently: Take 10 mg by mouth 3 (three) times daily as needed for muscle spasms. 07/23/19   Ventura Bruns, PA-C  cholecalciferol (VITAMIN D3) 25 MCG (1000 UNIT) tablet Take 1,000 Units by mouth at bedtime.    [provider]  diphenhydrAMINE (BENADRYL) 25 MG tablet Take 25-50 mg by mouth every 6  (six) hours as needed for allergies.    [provider]  EPINEPHrine (EPIPEN) 0.3 mg/0.3 mL SOAJ Inject 0.3 mLs (0.3 mg total) into the muscle as needed. Patient taking differently: Inject 0.3 mg into the muscle as needed for anaphylaxis. 12/01/12   Teressa Lower, MD  ibuprofen (ADVIL) 200 MG tablet Take 800 mg by mouth every 8 (eight) hours as needed for moderate pain.    [provider]  isosorbide mononitrate (IMDUR) 30 MG 24 hr tablet Take 1 tablet (30 mg total) by mouth daily. 10/06/21 10/01/22  Strader, Fransisco Hertz, PA-C  levothyroxine (SYNTHROID, LEVOTHROID) 300 MCG tablet Take 300 mcg by mouth every morning.  03/26/14   [provider]  losartan (COZAAR) 100 MG tablet Take 100 mg by mouth daily. 09/26/20   [provider]  meclizine (ANTIVERT) 25 MG tablet Take 1 tablet (25 mg total) by mouth 3 (three) times daily as needed for dizziness. 04/09/17   Isla Pence, MD  nitroGLYCERIN (NITROSTAT) 0.4 MG SL tablet Place 1 tablet (0.4 mg total) under the tongue every 5 (five) minutes as needed for chest pain. Up to 3 tablets then call 911 or go to the nearest ED. 10/06/21 01/04/22  Ahmed Prima, Fransisco Hertz, PA-C  Omega-3 Fatty Acids (FISH OIL) 1000 MG CAPS Take 1,000 mg by mouth daily.  [provider]  omeprazole (PRILOSEC) 40 MG capsule Take 1 capsule (40 mg total) by mouth 2 (two) times daily. 10/17/21 04/15/22  Eloise Harman, DO  ondansetron (ZOFRAN) 4 MG tablet Take 1 tablet (4 mg total) by mouth every 6 (six) hours. Patient taking differently: Take 4 mg by mouth every 8 (eight) hours as needed for nausea or vomiting. 01/04/20   Noemi Chapel, MD  promethazine (PHENERGAN) 25 MG tablet Take 1 tablet (25 mg total) by mouth every 6 (six) hours as needed for nausea or vomiting. 04/10/19   Jacqlyn Larsen, PA-C  rosuvastatin (CRESTOR) 5 MG tablet Take 1 tablet (5 mg total) by mouth once a week. 10/06/21 11/30/22  Strader, Fransisco Hertz, PA-C  Tetrahydroz-Dextran-PEG-Povid  (VISINE ADVANCED RELIEF) 0.05-0.1-1-1 % SOLN Place 1 drop into both eyes daily as needed (dry eyes).    [provider]  zolpidem (AMBIEN) 10 MG tablet Take 10 mg by mouth at bedtime.    [provider]      Allergies    Dust mite mixed allergen ext [mite (d. farinae)], Other, Iohexol, Almond (diagnostic), Crestor [rosuvastatin], Oysters [shellfish allergy], Wheat bran, and Yeast-related products    Review of Systems   Review of Systems  All other systems reviewed and are negative.   Physical Exam Updated Vital Signs BP (!) 146/80   Pulse 85   Temp 98.6 F (37 C) (Oral)   Resp 14   Ht 1.829 m (6')   Wt 107.5 kg   SpO2 95%   BMI 32.14 kg/m  Physical Exam Vitals and nursing note reviewed.  Constitutional:      General: He is not in acute distress.    Appearance: He is well-developed.  HENT:     Head: Normocephalic and atraumatic.     Mouth/Throat:     Pharynx: No oropharyngeal exudate.  Eyes:     General: No scleral icterus.       Right eye: No discharge.        Left eye: No discharge.     Conjunctiva/sclera: Conjunctivae normal.     Pupils: Pupils are equal, round, and reactive to light.  Neck:     Thyroid: No thyromegaly.     Vascular: No JVD.  Cardiovascular:     Rate and Rhythm: Normal rate and regular rhythm.     Heart sounds: Normal heart sounds. No murmur heard.    No friction rub. No gallop.  Pulmonary:     Effort: Pulmonary effort is normal. No respiratory distress.     Breath sounds: Normal breath sounds. No wheezing or rales.  Abdominal:     General: Bowel sounds are normal. There is no distension.     Palpations: Abdomen is soft. There is no mass.     Tenderness: There is no abdominal tenderness.  Musculoskeletal:        General: Tenderness present. No swelling.     Cervical back: Normal range of motion and neck supple.     Right lower leg: No edema.     Left lower leg: No edema.     Comments: Left lower extremity seems to be  straightened and internally rotated, he has pain with any range of motion of the left hip.  All other 3 extremities appear normal  Lymphadenopathy:     Cervical: No cervical adenopathy.  Skin:    General: Skin is warm and dry.     Findings: No erythema or rash.  Neurological:     Mental  Status: He is alert.     Coordination: Coordination normal.  Psychiatric:        Behavior: Behavior normal.     ED Results / Procedures / Treatments   Labs (all labs ordered are listed, but only abnormal results are displayed) Labs Reviewed  CBC WITH DIFFERENTIAL/PLATELET - Abnormal; Notable for the following components:      Result Value   MCHC 36.9 (*)    All other components within normal limits  BASIC METABOLIC PANEL - Abnormal; Notable for the following components:   Potassium 3.4 (*)    Glucose, Bld 153 (*)    Creatinine, Ser 1.29 (*)    Calcium 8.8 (*)    All other components within normal limits    EKG None  Radiology DG Hip Unilat W or Wo Pelvis 2-3 Views Left  Result Date: 12/09/2021 CLINICAL DATA:  Tripped and fell, landed on left hip, history of bilateral hip arthroplasty EXAM: DG HIP (WITH OR WITHOUT PELVIS) 2-3V LEFT COMPARISON:  07/22/2019 FINDINGS: Frontal view of the pelvis as well as frontal and cross-table lateral views of the left hip are obtained. Bilateral hip arthroplasties are in stable position without evidence of acute complication. No acute fracture, subluxation, or dislocation. Visualized portions of the bony pelvis are unremarkable. Soft tissues are grossly unremarkable. IMPRESSION: 1. Stable bilateral hip arthroplasties. No evidence of malalignment. 2. No acute displaced fracture. Electronically Signed   By: Randa Ngo M.D.   On: 12/09/2021 21:26   DG Femur Portable Min 2 Views Left  Result Date: 12/09/2021 CLINICAL DATA:  Fall EXAM: LEFT FEMUR PORTABLE 2 VIEWS COMPARISON:  None Available. FINDINGS: There is no evidence of fracture or other focal bone lesions.  Soft tissues are unremarkable. Bilateral total hip arthroplasties without perihardware lucency. IMPRESSION: No fracture or dislocation of the left femur. Electronically Signed   By: Ulyses Jarred M.D.   On: 12/09/2021 21:25    Procedures Procedures    Medications Ordered in ED Medications  ketorolac (TORADOL) 30 MG/ML injection 30 mg (has no administration in time range)  methocarbamol (ROBAXIN) tablet 1,000 mg (has no administration in time range)  HYDROmorphone (DILAUDID) injection 1 mg (1 mg Intravenous Given 12/09/21 2127)    ED Course/ Medical Decision Making/ A&P                           Medical Decision Making Amount and/or Complexity of Data Reviewed Labs: ordered. Radiology: ordered.  Risk Prescription drug management.   This patient presents to the ED for concern of hip pain after a fall, this involves an extensive number of treatment options, and is a complaint that carries with it a high risk of complications and morbidity.  The differential diagnosis includes fracture and dislocation of the primary 2 concerns, may just be a bursitis   Co morbidities that complicate the patient evaluation  Prior surgeries of both hips including hip replacements, hypertension and diabetes   Additional history obtained:  Additional history obtained from electronic medical record External records from outside source obtained and reviewed including prior notes from GI from colon / diverticulitis   Lab Tests:  I Ordered, and personally interpreted labs.  The pertinent results include: T and metabolic panel are overall unremarkable   Imaging Studies ordered:  I ordered imaging studies including x-rays of the hip and the femur showed no signs of fractures of the hip or the pelvis or the femur I independently visualized and interpreted  imaging which showed no acute findings I agree with the radiologist interpretation CT scan of the pelvis and lumbar spine have been added given the  level of the patient's pain   Cardiac Monitoring: / EKG:  The patient was maintained on a cardiac monitor.  I personally viewed and interpreted the cardiac monitored which showed an underlying rhythm of: Normal sinus rhythm   Consultations Obtained:  At change of shift care will be signed out to Dr. Sedonia Small to follow-up results and disposition accordingly, anticipate discharge home with pain control although the patient is having significant difficulty moving his leg   Problem List / ED Course / Critical interventions / Medication management  No acute findings found on plain film imaging so CT scan was ordered to further rule out invasive fractures of the lumbar spine or pelvis that were not seen on plain films I ordered medication including hydromorphone, Toradol, Robaxin for pain and muscle spasm Reevaluation of the patient after these medicines showed that the patient ongoing pain and now some worsening pain in the left lower back, hence the CT scans were ordered I have reviewed the patients home medicines and have made adjustments as needed   Social Determinants of Health:  None   Test / Admission - Considered:  At change of shift care will be signed out to oncoming physician, disposition to be determined by patient's improvement and CT scan findings         Final Clinical Impression(s) / ED Diagnoses Final diagnoses:  Contusion of left hip, initial encounter  Lumbar strain, initial encounter    Rx / DC Orders ED Discharge Orders     None         Noemi Chapel, MD 12/09/21 2305

## 2021-12-09 NOTE — ED Triage Notes (Signed)
Pt bib [facility] after he was tripped up by fire hose today causing him to fall and land on his L hip. Per EMS, pt with obvious rotation to L leg. Pt with hx of bilateral hip replacements. Pt given Fentanyl 50 mcg IM en route.

## 2021-12-10 ENCOUNTER — Emergency Department (HOSPITAL_COMMUNITY): Payer: No Typology Code available for payment source

## 2021-12-10 MED ORDER — HYDROMORPHONE HCL 1 MG/ML IJ SOLN
1.0000 mg | Freq: Once | INTRAMUSCULAR | Status: AC
  Administered 2021-12-10: 1 mg via INTRAVENOUS
  Filled 2021-12-10: qty 1

## 2021-12-10 MED ORDER — OXYCODONE HCL 5 MG PO TABS: 5.0000 mg | ORAL_TABLET | ORAL | 0 refills | Status: DC | PRN

## 2021-12-10 NOTE — ED Provider Notes (Signed)
  Provider Note MRN:  [id number]  Arrival date & time: 12/10/21    ED Course and Medical Decision Making  Assumed care from Dr. Sabra Heck at shift change.  Fall, hip pain, awaiting CT imaging.  Imaging is reassuring, no signs of acute traumatic injury.  Examination patient looks well, neurovascularly intact, cauda equina symptoms, appropriate for discharge.  Procedures  Final Clinical Impressions(s) / ED Diagnoses     ICD-10-CM   1. Contusion of left hip, initial encounter  S70.02XA     2. Lumbar strain, initial encounter  S39.012A       ED Discharge Orders     None       Discharge Instructions   None     Barth Kirks. Sedonia Small, [place] [email or web address]    Maudie Flakes, MD 12/10/21 [phone]

## 2021-12-10 NOTE — Discharge Instructions (Signed)
You were evaluated in the Emergency Department and after careful evaluation, we did not find any emergent condition requiring admission or further testing in the hospital.  Your exam/testing today is overall reassuring.  No signs of broken bones on the CT or x-ray.  Recommend Tylenol 1000 mg every 4-6 hours and/or Motrin 600 mg every 4-6 hours for pain.  You can use the oxycodone medication for more significant pain.  Recommend follow-up with your regular doctor.  Please return to the Emergency Department if you experience any worsening of your condition.   Thank you for allowing [place] to be a part of your care.

## 2022-01-26 ENCOUNTER — Ambulatory Visit: Payer: No Typology Code available for payment source | Admitting: Student

## 2022-05-15 ENCOUNTER — Encounter: Payer: Self-pay | Admitting: Physician Assistant

## 2022-05-15 NOTE — Progress Notes (Unsigned)
Cardiology Office Note    Date:  05/16/2022   ID:  Andrew Love, DOB [birth year], MRN [id number]  PCP:  Wyline Beady, PA-C  Cardiologist:  Rozann Lesches, MD  Electrophysiologist:  None   Chief Complaint: joint pain  History of Present Illness:   Andrew Love is a 57 y.o. male with history of CAD (last cath 05/2021 95% RV managed medically), chronic back pain, HTN, hypothyroidism, diverticulosis and GERD who is seen for follow-up.  He had remote cath in 2009 suggesting 30% RCA with microvascular angina. He had a low risk stress test in 2014. He was hospitalized in 05/2021 for chest pain with negative troponins and d-dimer. Cath 05/2021 showed 95% distal RV marginal disease that is collateralized by the left system; medical therapy recommended given relatively small vessel with otherwise mild obstructive disease elsewhere. 2D echo 05/2021 EF 60-65%, mild LVH, normal RV, trivial MR. He was started on Imdur later titrated up. At last OV 10/2021 he was having recurrent chest pain but had not been taking Imdur so it was restarted. Crestor was previously reduced from '20mg'$  to '10mg'$  to '5mg'$  once weekly due to myalgias.   He is seen for follow-up today reporting the following: 1) Primary complaint of joint pain, diffuse, not improved with cessation of rosuvastatin. He reports being seen at [facility] being told he had extremely high iron levels but had not heard back about next steps or hematology evaluation. He does not believe he had any autoimmune workup. No specific fam hx of such though states he would not be surprised if his mother had RA. This is across multiple joints including hands, knuckles, hips, and ankles. He also has a burning sensation that sometimes crosses across the front of his thighs down to his lower legs. He reports he is scheduled for LE vascular study at the [place] coming up. They also plan to consider starting injections for cholesterol since he is off statin. He is concerned that his  primary team at the [place] is not otherwise acting on this problem. 2) DOE for the last 1 month. Walks 3-5 miles daily so does not keep him from achieving this though his exercise tolerance has greatly decreased. No hemoptysis, infective symptoms. +10lb weight gain. No orthopnea though woke up 2-3x in the last month feeling like a panic attack. No SOB at rest today. No pleuritic CP. No s/sx DVT. Not tachycardic or hypoxic. 3) Chest pain has improved with restart of Imdur. Now about 3x/week lasting only 30 sec, not associated with activity. Fairly focal location in chest.  Labwork independently reviewed: 12/2021 K 3.4, Cr 1.29, Hgb 15.0, plt 2434 05/2021 Mg 2.4, LDL 102, trig 255 2017 TSH 0.067  Cardiology Studies:   Studies reviewed are outlined and summarized above. Reports included below if pertinent.   LHC 05/2021    RV Branch 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   LV end diastolic pressure is normal.   The left ventricular ejection fraction is 55-65% by visual estimate.   1.  Distal RV marginal disease that is collateralized by the left system; medical therapy should be pursued as this is a relatively small vessel with otherwise mild obstructive disease elsewhere. 2.  LVEDP of 13 mmHg with preserved left ventricular function.   Echo 05/2021   1. Left ventricular ejection fraction, by estimation, is 60 to 65%. The  left ventricle has normal function. The left ventricle has no regional  wall motion abnormalities. There is mild left ventricular hypertrophy.  Left  ventricular diastolic parameters  are indeterminate.   2. Right ventricular systolic function is normal. The right ventricular  size is normal. There is normal pulmonary artery systolic pressure. The  estimated right ventricular systolic pressure is 47.8 mmHg.   3. The mitral valve is grossly normal. Trivial mitral valve  regurgitation.   4. The aortic valve is tricuspid. Aortic valve regurgitation is not  visualized. No aortic stenosis is  present. Aortic valve mean gradient  measures 3.0 mmHg.   5. The inferior vena cava is normal in size with greater than 50%  respiratory variability, suggesting right atrial pressure of 3 mmHg.   Comparison(s): Prior images unable to be directly viewed.     Past Medical History:  Diagnosis Date   Arthritis    CAD (coronary artery disease)    Chronic back pain    Chronic left hip pain    Colitis    Per medical history from dated 06/13/10.   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   Sigmoid colon ulcer    Rectal polyps   Sleep apnea with use of continuous positive airway pressure (CPAP)    Urticaria     Past Surgical History:  Procedure Laterality Date   APPENDECTOMY     8th grade   BACK SURGERY     BIOPSY  10/17/2021   Procedure: BIOPSY;  Surgeon: Eloise Harman, DO;  [facility];  Service: Endoscopy;;   CARDIAC CATHETERIZATION  2009   CARDIAC CATHETERIZATION     Per medical history from dated 06/13/10.   CERVICAL SPINE SURGERY     C4, C5, C6 spinal fusion   CHOLECYSTECTOMY N/A 08/31/2015   Procedure: LAPAROSCOPIC CHOLECYSTECTOMY WITH INTRAOPERATIVE CHOLANGIOGRAM;  Surgeon: Excell Seltzer, MD;  Location: [facility];  Service: General;  Laterality: N/A;   COLONOSCOPY  07/2008   Colitis,NSAID v. Ischemia,malrotation of the gut,Diverticulosis(L),hyperplastic   COLONOSCOPY N/A 12/13/2012   Dr. Oneida Alar: Normal TI, mild sigmoid diverticulosis, hemorrhoids, 2 polyps (tubular adenoma). Random colon bx negative. Next TCS 12/2022 with Fentanyl/phenergan   COLONOSCOPY WITH PROPOFOL N/A 10/17/2021   Procedure: COLONOSCOPY WITH PROPOFOL;  Surgeon: Eloise Harman, DO;   [facility];  Service: Endoscopy;  Laterality: N/A;  10:15am   ESOPHAGOGASTRODUODENOSCOPY N/A 04/16/2014   RMR: Erosive reflux esophagitis. Non critical Schzki's ring not manipulated. Small hiatal hernia. Abnormal gastirc mucosa of doubtful signigicance status post biopsy. I suspect trivial upper GI bleed. Recent abdominal pain presumably secondary to a protracted bout of diverticulitis. CT scan November 23 revealed improvemetn without complication. He finished his antibiotics 2 days age. Left sided ab   ESOPHAGOGASTRODUODENOSCOPY (EGD) WITH PROPOFOL N/A 10/17/2021   Procedure: ESOPHAGOGASTRODUODENOSCOPY (EGD) WITH PROPOFOL;  Surgeon: Eloise Harman, DO;  [facility];  Service: Endoscopy;  Laterality: N/A;   JOINT REPLACEMENT     LAPAROSCOPIC SIGMOID COLECTOMY  2012   Dr. Fanny Skates: recurrent sigmoid diverticulitis   LAPAROSCOPIC SIGMOID COLECTOMY  2012   recurernt sigmoid diverticulitis, Dr. Dalbert Batman    LEFT HEART CATH AND CORONARY ANGIOGRAPHY N/A 06/03/2021   Procedure: LEFT HEART CATH AND CORONARY ANGIOGRAPHY;  Surgeon:  Early Osmond, MD;  Location: [facility];  Service: Cardiovascular;  Laterality: N/A;   POLYPECTOMY  10/17/2021   Procedure: POLYPECTOMY;  Surgeon: Eloise Harman, DO;  [facility];  Service: Endoscopy;;   ROTATOR CUFF REPAIR     Left - per medical history form dated 06/13/10.   SHOULDER SURGERY     Left   THYROIDECTOMY     Per medical history form dated 06/13/10.   TOTAL HIP ARTHROPLASTY  07/04/2011   Procedure: TOTAL HIP ARTHROPLASTY;  Surgeon: Johnny Bridge, MD;  Location: [facility];  Service: Orthopedics;  Laterality: Right;   TOTAL HIP ARTHROPLASTY Left 07/22/2019   Procedure: TOTAL HIP ARTHROPLASTY;  Surgeon: Marchia Bond, MD;  Location: [facility];  Service: Orthopedics;  Laterality: Left;    Current Medications: Current Meds  Medication Sig   acetaminophen (TYLENOL) 500 MG tablet Take 500-1,000 mg by mouth every 6  (six) hours as needed for mild pain or fever.   aspirin EC 81 MG EC tablet Take 1 tablet (81 mg total) by mouth daily. Swallow whole. (Patient taking differently: Take 81 mg by mouth daily as needed. Swallow whole.)   cholecalciferol (VITAMIN D3) 25 MCG (1000 UNIT) tablet Take 1,000 Units by mouth at bedtime.   diphenhydrAMINE (BENADRYL) 25 MG tablet Take 25-50 mg by mouth every 6 (six) hours as needed for allergies.   EPINEPHrine (EPIPEN) 0.3 mg/0.3 mL SOAJ Inject 0.3 mLs (0.3 mg total) into the muscle as needed. (Patient taking differently: Inject 0.3 mg into the muscle as needed for anaphylaxis.)   ibuprofen (ADVIL) 200 MG tablet Take 800 mg by mouth every 8 (eight) hours as needed for moderate pain.   isosorbide mononitrate (IMDUR) 30 MG 24 hr tablet Take 1 tablet (30 mg total) by mouth daily.   levothyroxine (SYNTHROID, LEVOTHROID) 300 MCG tablet Take 300 mcg by mouth every morning.    losartan (COZAAR) 100 MG tablet Take 100 mg by mouth daily.   meclizine (ANTIVERT) 25 MG tablet Take 1 tablet (25 mg total) by mouth 3 (three) times daily as needed for dizziness.   nitroGLYCERIN (NITROSTAT) 0.4 MG SL tablet Place 1 tablet (0.4 mg total) under the tongue every 5 (five) minutes as needed for chest pain. Up to 3 tablets then call 911 or go to the nearest ED.   Omega-3 Fatty Acids (FISH OIL) 1000 MG CAPS Take 1,000 mg by mouth daily.   omeprazole (PRILOSEC) 40 MG capsule Take 1 capsule (40 mg total) by mouth 2 (two) times daily.   ondansetron (ZOFRAN) 4 MG tablet Take 1 tablet (4 mg total) by mouth every 6 (six) hours. (Patient taking differently: Take 4 mg by mouth every 8 (eight) hours as needed for nausea or vomiting.)   oxyCODONE (ROXICODONE) 5 MG immediate release tablet Take 1 tablet (5 mg total) by mouth every 4 (four) hours as needed for severe pain.   promethazine (PHENERGAN) 25 MG tablet Take 1 tablet (25 mg total) by mouth every 6 (six) hours as needed for nausea or vomiting.    rosuvastatin (CRESTOR) 5 MG tablet Take 1 tablet (5 mg total) by mouth once a week.   Tetrahydroz-Dextran-PEG-Povid (VISINE ADVANCED RELIEF) 0.05-0.1-1-1 % SOLN Place 1 drop into both eyes daily as needed (dry eyes).    Allergies:   Dust mite mixed allergen ext [mite (d. 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   Married   Social Determinants of Radio broadcast assistant Strain: Not on file  Food Insecurity: Not on file  Transportation Needs: Not on file  Physical Activity: Not on file  Stress: Not on file  Social Connections: Not on file     Family History:  The patient's family history includes Cancer in his father and mother; Diverticulitis in his mother; Hypertension in his father and mother. There is no history of Anesthesia problems, Hypotension, Malignant hyperthermia, Pseudochol deficiency, or Colon cancer.  ROS:   Please see the history of present illness.  All other systems are reviewed and otherwise negative.    EKG(s)/Additional Labs   EKG:  EKG is ordered today, personally reviewed,  demonstrating NSR 83bpm, nonspecific STTW changes, similar to prior.  Recent Labs: 06/02/2021: ALT 46 06/03/2021: Magnesium 2.4 12/09/2021: BUN 13; Creatinine, Ser 1.29; Hemoglobin 15.0; Platelets 244; Potassium 3.4; Sodium 136  Recent Lipid Panel    Component Value Date/Time   CHOL 179 06/02/2021 0544   TRIG 255 (H) 06/02/2021 0544   HDL 26 (L) 06/02/2021 0544   CHOLHDL 6.9 06/02/2021 0544   VLDL 51 (H) 06/02/2021 0544   LDLCALC 102 (H) 06/02/2021 0544    PHYSICAL EXAM:    VS:  BP 138/80   Pulse 83   Ht 6' (1.829 m)   Wt 243 lb (110.2 kg)   SpO2 95%   BMI 32.96 kg/m   BMI: Body mass index is 32.96 kg/m.  GEN: Well nourished, well developed male in no acute distress HEENT: normocephalic, atraumatic Neck: no JVD, carotid bruits, or masses Cardiac: RRR; no murmurs, rubs, or gallops, no edema, 2+ PT pulses bilaterally, 1+ DP pulses bilaterally Respiratory:  clear to auscultation bilaterally, normal work of breathing GI: soft, nontender, nondistended, + BS MS: no deformity or atrophy Skin: warm and dry, no rash Neuro:  Alert and Oriented x 3, Strength and sensation are intact, follows commands Psych: euthymic mood, full affect  Wt Readings from Last 3 Encounters:  05/16/22 243 lb (110.2 kg)  12/09/21 237 lb (107.5 kg)  10/17/21 236 lb 15.9 oz (107.5 kg)     ASSESSMENT & PLAN:   1. CAD/chest pain, dyspnea on exertion - chest pain has improved with restart of Imdur, with features that are generally atypical. Recommend to continue current dose along with ASA. He has stopped rosuvastatin though it did not help improve his joint aches. He does not wish to revisit statin at this time given acute issues as he has plans to discuss potential [facility] (likely [id number] though am not sure whether he would yet qualify unless myopathy/labs come up abnormal). We will remove from medicine list. Regarding DOE, unclear what this presents. He is worried about a heart issue. We will  undertake 2V CXR, 2D Echo and Lexiscan nuclear stress test. He had issues getting HR up in the past on treadmill and does not wish to start with that because of his joint paints. Check BNP and CBC with labs. He also reports a h/o increased iron lately though reports he is in limbo with next steps from PCP. We will recheck his Hgb here as well as serum iron and ferritin. Question whether this represents hemachromotosis versus an inflammatory process.   2. Essential HTN -  suboptimal BP noted, await labs to guide next med steps. Continue Imdur, losartan. Note prior low hypokalemia and CKD on labs.  3. Hyperlipidemia - see above regarding lipid plan. Will await input from his team at the [facility] about [facility] therapy.  4. Myalgias - this is his primary complaint and I cannot explain this from a cardiac standpoint. It also persisted despite dose adjustment and discontinuation of his statin therapy. We will get lytes, CK, inflammatory markers, iron/ferritin, RF, ANA and anti-CCP. If these are normal recommend ongoing evaluation with PCP. He also reports he takes ibuprofen very sparingly, last used 3 weeks ago.  5. Hypothyroidism - on fairly high dose levothyroxine. Will recheck TSH and FT4 with labs given complaints.    Disposition: F/u 6 weeks after testing.   Medication Adjustments/Labs and Tests Ordered: Current medicines are reviewed at length with the patient today.  Concerns regarding medicines are outlined above. Medication changes, Labs and Tests ordered today are summarized above and listed in the Patient Instructions accessible in Encounters.    Signed, Charlie Pitter, PA-C  05/16/2022 2:18 PM    [facility] [place]. [place], [place] [address] Ph: [phone]; Fax [phone]

## 2022-05-16 ENCOUNTER — Other Ambulatory Visit (HOSPITAL_COMMUNITY)
Admission: RE | Admit: 2022-05-16 | Discharge: 2022-05-16 | Disposition: A | Discharge status: Home / Self Care | Payer: Self-pay | Source: Ambulatory Visit | Attending: Student | Admitting: Student

## 2022-05-16 ENCOUNTER — Ambulatory Visit: Payer: No Typology Code available for payment source | Attending: Student | Admitting: Physician Assistant

## 2022-05-16 ENCOUNTER — Encounter: Payer: Self-pay | Admitting: Physician Assistant

## 2022-05-16 VITALS — BP 138/80 | HR 83 | Ht 72.0 in | Wt 243.0 lb

## 2022-05-16 DIAGNOSIS — E785 Hyperlipidemia, unspecified: Secondary | ICD-10-CM

## 2022-05-16 DIAGNOSIS — M791 Myalgia, unspecified site: Secondary | ICD-10-CM

## 2022-05-16 DIAGNOSIS — E039 Hypothyroidism, unspecified: Secondary | ICD-10-CM

## 2022-05-16 DIAGNOSIS — R0609 Other forms of dyspnea: Secondary | ICD-10-CM | POA: Insufficient documentation

## 2022-05-16 DIAGNOSIS — R072 Precordial pain: Secondary | ICD-10-CM

## 2022-05-16 DIAGNOSIS — I10 Essential (primary) hypertension: Secondary | ICD-10-CM

## 2022-05-16 DIAGNOSIS — I2510 Atherosclerotic heart disease of native coronary artery without angina pectoris: Secondary | ICD-10-CM

## 2022-05-16 LAB — COMPREHENSIVE METABOLIC PANEL
ALT: 50 U/L — ABNORMAL HIGH (ref 0–44)
AST: 42 U/L — ABNORMAL HIGH (ref 15–41)
Albumin: 4 g/dL (ref 3.5–5.0)
Alkaline Phosphatase: 116 U/L (ref 38–126)
Anion gap: 8 (ref 5–15)
BUN: 12 mg/dL (ref 6–20)
CO2: 24 mmol/L (ref 22–32)
Calcium: 8.8 mg/dL — ABNORMAL LOW (ref 8.9–10.3)
Chloride: 102 mmol/L (ref 98–111)
Creatinine, Ser: 1.09 mg/dL (ref 0.61–1.24)
GFR, Estimated: >60 mL/min (ref >60)
Glucose, Bld: 211 mg/dL — ABNORMAL HIGH (ref 70–99)
Potassium: 3.7 mmol/L (ref 3.5–5.1)
Sodium: 134 mmol/L — ABNORMAL LOW (ref 135–145)
Total Bilirubin: 0.8 mg/dL (ref 0.3–1.2)
Total Protein: 7.2 g/dL (ref 6.5–8.1)

## 2022-05-16 LAB — CBC
HCT: 42.7 % (ref 39.0–52.0)
Hemoglobin: 15.3 g/dL (ref 13.0–17.0)
MCH: 31.9 pg (ref 26.0–34.0)
MCHC: 35.8 g/dL (ref 30.0–36.0)
MCV: 89 fL (ref 80.0–100.0)
Platelets: 242 10*3/uL (ref 150–400)
RBC: 4.8 MIL/uL (ref 4.22–5.81)
RDW: 11.7 % (ref 11.5–15.5)
WBC: 7.6 10*3/uL (ref 4.0–10.5)
nRBC: 0 % (ref 0.0–0.2)

## 2022-05-16 LAB — T4, FREE: Free T4: 1.63 ng/dL — ABNORMAL HIGH (ref 0.61–1.12)

## 2022-05-16 LAB — TSH: TSH: 0.807 u[IU]/mL (ref 0.350–4.500)

## 2022-05-16 LAB — IRON AND TIBC
Iron: 156 ug/dL (ref 45–182)
Saturation Ratios: 47 % — ABNORMAL HIGH (ref 17.9–39.5)
TIBC: 333 ug/dL (ref 250–450)
UIBC: 177 ug/dL

## 2022-05-16 LAB — SEDIMENTATION RATE: Sed Rate: 2 mm/hr (ref 0–16)

## 2022-05-16 LAB — CK: Total CK: 106 U/L (ref 49–397)

## 2022-05-16 LAB — C-REACTIVE PROTEIN: CRP: 0.6 mg/dL (ref <1.0)

## 2022-05-16 LAB — FERRITIN: Ferritin: 775 ng/mL — ABNORMAL HIGH (ref 24–336)

## 2022-05-16 LAB — BRAIN NATRIURETIC PEPTIDE: B Natriuretic Peptide: 24 pg/mL (ref 0.0–100.0)

## 2022-05-16 LAB — MAGNESIUM: Magnesium: 1.9 mg/dL (ref 1.7–2.4)

## 2022-05-16 NOTE — Patient Instructions (Signed)
Medication Instructions:  Your physician recommends that you continue on your current medications as directed. Please refer to the Current Medication list given to you today.   Labwork: Lab slips given to you  Testing/Procedures: Your physician has requested that you have an echocardiogram. Echocardiography is a painless test that uses sound waves to create images of your heart. It provides your doctor with information about the size and shape of your heart and how well your heart's chambers and valves are working. This procedure takes approximately one hour. There are no restrictions for this procedure. Please do NOT wear cologne, perfume, aftershave, or lotions (deodorant is allowed). Please arrive 15 minutes prior to your appointment time.  Your physician has requested that you have a lexiscan myoview. For further information please visit [email or web address]. Please follow instruction sheet, as given.  A chest x-ray takes a picture of the organs and structures inside the chest, including the heart, lungs, and blood vessels. This test can show several things, including, whether the heart is enlarges; whether fluid is building up in the lungs; and whether pacemaker / defibrillator leads are still in place.   Follow-Up: Follow up with Melina Copa, PA-C in 6 weeks.  Any Other Special Instructions Will Be Listed Below (If Applicable).     If you need a refill on your cardiac medications before your next appointment, please call your pharmacy.

## 2022-05-17 ENCOUNTER — Telehealth: Payer: Self-pay

## 2022-05-17 LAB — ANA: Anti Nuclear Antibody (ANA): NEGATIVE

## 2022-05-17 LAB — CYCLIC CITRUL PEPTIDE ANTIBODY, IGG/IGA: CCP Antibodies IgG/IgA: 5 units (ref 0–19)

## 2022-05-17 NOTE — Telephone Encounter (Signed)
-----   Message from Charlie Pitter, [place] sent at 05/17/2022  7:56 AM EST ----- Please let Andrew Love know the following (and feel free to copy/paste to MyChart):  "Labs look OK from a heart standpoint but there are a few abnormalities I'd like you to follow-up with your primary care about:  - TSH is on the low end of normal and free T4 is up, so your thyroid dose may need to be adjusted downward - your liver function tests are marginally elevated though this looks pretty stable going back a few years - since you're no longer on the statin, the most common cause we see is a condition called fatty liver but would need follow-up with primary care to assess if they've checked on this - calcium level remains just 0.1 pt below normal but similar to prior - ferritin level remains high though improved from prior - remains unclear what is driving this so definitely recommend talking with PCP about expediting evaluation" - the basic autoimmune labs are still pending, I will send msg when they are back  For BP, would recommend starting amlodipine '5mg'$  daily and submitting BP readings in 1 week at least 3 hours after taking AM meds. Thanks.

## 2022-05-17 NOTE — Telephone Encounter (Signed)
My chart message sent to pt.

## 2022-05-18 LAB — RHEUMATOID FACTOR: Rheumatoid fact SerPl-aCnc: <10 IU/mL (ref <14.0)

## 2022-05-22 ENCOUNTER — Other Ambulatory Visit: Payer: Self-pay

## 2022-05-22 MED ORDER — AMLODIPINE BESYLATE 5 MG PO TABS: 5.0000 mg | ORAL_TABLET | Freq: Every day | ORAL | 3 refills | Status: DC

## 2022-05-26 ENCOUNTER — Encounter (HOSPITAL_COMMUNITY): Payer: No Typology Code available for payment source

## 2022-05-26 ENCOUNTER — Ambulatory Visit (HOSPITAL_COMMUNITY): Payer: 59

## 2022-06-29 ENCOUNTER — Ambulatory Visit: Payer: No Typology Code available for payment source | Admitting: Cardiology

## 2022-07-25 ENCOUNTER — Encounter (HOSPITAL_COMMUNITY): Payer: Self-pay

## 2022-07-25 ENCOUNTER — Other Ambulatory Visit: Payer: Self-pay

## 2022-07-25 ENCOUNTER — Inpatient Hospital Stay (HOSPITAL_COMMUNITY)
Admission: EM | Admit: 2022-07-25 | Discharge: 2022-07-29 | DRG: 392 | Disposition: A | Discharge status: Home / Self Care | Payer: No Typology Code available for payment source | Attending: Internal Medicine | Admitting: Internal Medicine

## 2022-07-25 ENCOUNTER — Emergency Department (HOSPITAL_COMMUNITY): Payer: No Typology Code available for payment source

## 2022-07-25 DIAGNOSIS — Z79899 Other long term (current) drug therapy: Secondary | ICD-10-CM

## 2022-07-25 DIAGNOSIS — Z9049 Acquired absence of other specified parts of digestive tract: Secondary | ICD-10-CM

## 2022-07-25 DIAGNOSIS — Z9109 Other allergy status, other than to drugs and biological substances: Secondary | ICD-10-CM

## 2022-07-25 DIAGNOSIS — I10 Essential (primary) hypertension: Secondary | ICD-10-CM | POA: Diagnosis present

## 2022-07-25 DIAGNOSIS — Z87891 Personal history of nicotine dependence: Secondary | ICD-10-CM

## 2022-07-25 DIAGNOSIS — E869 Volume depletion, unspecified: Secondary | ICD-10-CM | POA: Diagnosis present

## 2022-07-25 DIAGNOSIS — Z91013 Allergy to seafood: Secondary | ICD-10-CM

## 2022-07-25 DIAGNOSIS — K2100 Gastro-esophageal reflux disease with esophagitis, without bleeding: Secondary | ICD-10-CM | POA: Diagnosis present

## 2022-07-25 DIAGNOSIS — Z1152 Encounter for screening for COVID-19: Secondary | ICD-10-CM

## 2022-07-25 DIAGNOSIS — R112 Nausea with vomiting, unspecified: Secondary | ICD-10-CM | POA: Diagnosis present

## 2022-07-25 DIAGNOSIS — E039 Hypothyroidism, unspecified: Secondary | ICD-10-CM | POA: Diagnosis present

## 2022-07-25 DIAGNOSIS — E876 Hypokalemia: Secondary | ICD-10-CM | POA: Diagnosis present

## 2022-07-25 DIAGNOSIS — K5730 Diverticulosis of large intestine without perforation or abscess without bleeding: Secondary | ICD-10-CM | POA: Diagnosis present

## 2022-07-25 DIAGNOSIS — G4730 Sleep apnea, unspecified: Secondary | ICD-10-CM | POA: Diagnosis present

## 2022-07-25 DIAGNOSIS — Z8249 Family history of ischemic heart disease and other diseases of the circulatory system: Secondary | ICD-10-CM

## 2022-07-25 DIAGNOSIS — Z96642 Presence of left artificial hip joint: Secondary | ICD-10-CM | POA: Diagnosis present

## 2022-07-25 DIAGNOSIS — E785 Hyperlipidemia, unspecified: Secondary | ICD-10-CM | POA: Diagnosis present

## 2022-07-25 DIAGNOSIS — Z981 Arthrodesis status: Secondary | ICD-10-CM

## 2022-07-25 DIAGNOSIS — B9689 Other specified bacterial agents as the cause of diseases classified elsewhere: Secondary | ICD-10-CM | POA: Diagnosis present

## 2022-07-25 DIAGNOSIS — E871 Hypo-osmolality and hyponatremia: Secondary | ICD-10-CM | POA: Diagnosis present

## 2022-07-25 DIAGNOSIS — L409 Psoriasis, unspecified: Secondary | ICD-10-CM | POA: Diagnosis present

## 2022-07-25 DIAGNOSIS — E890 Postprocedural hypothyroidism: Secondary | ICD-10-CM | POA: Diagnosis present

## 2022-07-25 DIAGNOSIS — K529 Noninfective gastroenteritis and colitis, unspecified: Secondary | ICD-10-CM | POA: Diagnosis not present

## 2022-07-25 DIAGNOSIS — G8929 Other chronic pain: Secondary | ICD-10-CM | POA: Diagnosis present

## 2022-07-25 DIAGNOSIS — Z8701 Personal history of pneumonia (recurrent): Secondary | ICD-10-CM

## 2022-07-25 DIAGNOSIS — Z91041 Radiographic dye allergy status: Secondary | ICD-10-CM

## 2022-07-25 DIAGNOSIS — A045 Campylobacter enteritis: Secondary | ICD-10-CM | POA: Insufficient documentation

## 2022-07-25 DIAGNOSIS — Z79890 Hormone replacement therapy (postmenopausal): Secondary | ICD-10-CM

## 2022-07-25 DIAGNOSIS — Z888 Allergy status to other drugs, medicaments and biological substances status: Secondary | ICD-10-CM

## 2022-07-25 DIAGNOSIS — Z87892 Personal history of anaphylaxis: Secondary | ICD-10-CM

## 2022-07-25 DIAGNOSIS — Z803 Family history of malignant neoplasm of breast: Secondary | ICD-10-CM

## 2022-07-25 DIAGNOSIS — M1631 Unilateral osteoarthritis resulting from hip dysplasia, right hip: Secondary | ICD-10-CM | POA: Diagnosis present

## 2022-07-25 DIAGNOSIS — I2510 Atherosclerotic heart disease of native coronary artery without angina pectoris: Secondary | ICD-10-CM | POA: Diagnosis present

## 2022-07-25 DIAGNOSIS — K5100 Ulcerative (chronic) pancolitis without complications: Principal | ICD-10-CM

## 2022-07-25 DIAGNOSIS — Z91018 Allergy to other foods: Secondary | ICD-10-CM

## 2022-07-25 DIAGNOSIS — Z7982 Long term (current) use of aspirin: Secondary | ICD-10-CM

## 2022-07-25 DIAGNOSIS — Z808 Family history of malignant neoplasm of other organs or systems: Secondary | ICD-10-CM

## 2022-07-25 DIAGNOSIS — Z86010 Personal history of colonic polyps: Secondary | ICD-10-CM

## 2022-07-25 LAB — CBC
HCT: 41.8 % (ref 39.0–52.0)
Hemoglobin: 15.1 g/dL (ref 13.0–17.0)
MCH: 31.3 pg (ref 26.0–34.0)
MCHC: 36.1 g/dL — ABNORMAL HIGH (ref 30.0–36.0)
MCV: 86.7 fL (ref 80.0–100.0)
Platelets: 249 10*3/uL (ref 150–400)
RBC: 4.82 MIL/uL (ref 4.22–5.81)
RDW: 12.2 % (ref 11.5–15.5)
WBC: 9.9 10*3/uL (ref 4.0–10.5)
nRBC: 0 % (ref 0.0–0.2)

## 2022-07-25 LAB — BASIC METABOLIC PANEL
Anion gap: 11 (ref 5–15)
BUN: 8 mg/dL (ref 6–20)
CO2: 25 mmol/L (ref 22–32)
Calcium: 8.4 mg/dL — ABNORMAL LOW (ref 8.9–10.3)
Chloride: 95 mmol/L — ABNORMAL LOW (ref 98–111)
Creatinine, Ser: 1.04 mg/dL (ref 0.61–1.24)
GFR, Estimated: >60 mL/min (ref >60)
Glucose, Bld: 138 mg/dL — ABNORMAL HIGH (ref 70–99)
Potassium: 3 mmol/L — ABNORMAL LOW (ref 3.5–5.1)
Sodium: 131 mmol/L — ABNORMAL LOW (ref 135–145)

## 2022-07-25 LAB — HEPATIC FUNCTION PANEL
ALT: 37 U/L (ref 0–44)
AST: 26 U/L (ref 15–41)
Albumin: 3.7 g/dL (ref 3.5–5.0)
Alkaline Phosphatase: 117 U/L (ref 38–126)
Bilirubin, Direct: 0.2 mg/dL (ref 0.0–0.2)
Indirect Bilirubin: 1 mg/dL — ABNORMAL HIGH (ref 0.3–0.9)
Total Bilirubin: 1.2 mg/dL (ref 0.3–1.2)
Total Protein: 7.3 g/dL (ref 6.5–8.1)

## 2022-07-25 LAB — URINALYSIS, ROUTINE W REFLEX MICROSCOPIC
Bilirubin Urine: NEGATIVE
Glucose, UA: NEGATIVE mg/dL
Hgb urine dipstick: NEGATIVE
Ketones, ur: NEGATIVE mg/dL
Leukocytes,Ua: NEGATIVE
Nitrite: NEGATIVE
Protein, ur: NEGATIVE mg/dL
Specific Gravity, Urine: 1.015 (ref 1.005–1.030)
pH: 6 (ref 5.0–8.0)

## 2022-07-25 LAB — LIPASE, BLOOD: Lipase: 21 U/L (ref 11–51)

## 2022-07-25 MED ORDER — METOCLOPRAMIDE HCL 5 MG/ML IJ SOLN
10.0000 mg | Freq: Once | INTRAMUSCULAR | Status: AC
  Administered 2022-07-25: 10 mg via INTRAVENOUS

## 2022-07-25 MED ORDER — HYDROMORPHONE HCL 1 MG/ML IJ SOLN
1.0000 mg | Freq: Once | INTRAMUSCULAR | Status: AC
  Administered 2022-07-25: 1 mg via INTRAVENOUS
  Filled 2022-07-25: qty 1

## 2022-07-25 MED ORDER — HYDROMORPHONE HCL 1 MG/ML IJ SOLN
0.5000 mg | Freq: Once | INTRAMUSCULAR | Status: AC
  Administered 2022-07-25: 0.5 mg via INTRAVENOUS

## 2022-07-25 MED ORDER — HYDROMORPHONE HCL 1 MG/ML IJ SOLN
INTRAMUSCULAR | Status: AC
  Filled 2022-07-25: qty 0.5

## 2022-07-25 MED ORDER — SODIUM CHLORIDE 0.9 % IV BOLUS
1000.0000 mL | Freq: Once | INTRAVENOUS | Status: AC
  Administered 2022-07-25: 1000 mL via INTRAVENOUS

## 2022-07-25 MED ORDER — CIPROFLOXACIN IN D5W 400 MG/200ML IV SOLN
400.0000 mg | Freq: Two times a day (BID) | INTRAVENOUS | Status: DC
  Administered 2022-07-26 – 2022-07-27 (×3): 400 mg via INTRAVENOUS
  Filled 2022-07-25 (×3): qty 200

## 2022-07-25 MED ORDER — POTASSIUM CHLORIDE 10 MEQ/100ML IV SOLN
10.0000 meq | INTRAVENOUS | Status: AC
  Administered 2022-07-26 (×2): 10 meq via INTRAVENOUS
  Filled 2022-07-25 (×2): qty 100

## 2022-07-25 MED ORDER — METRONIDAZOLE 500 MG/100ML IV SOLN
500.0000 mg | Freq: Two times a day (BID) | INTRAVENOUS | Status: DC
  Administered 2022-07-26 – 2022-07-27 (×4): 500 mg via INTRAVENOUS
  Filled 2022-07-25 (×4): qty 100

## 2022-07-25 MED ORDER — ONDANSETRON HCL 4 MG/2ML IJ SOLN
4.0000 mg | Freq: Once | INTRAMUSCULAR | Status: AC
  Administered 2022-07-25: 4 mg via INTRAVENOUS
  Filled 2022-07-25: qty 2

## 2022-07-25 MED ORDER — CIPROFLOXACIN IN D5W 400 MG/200ML IV SOLN
400.0000 mg | Freq: Once | INTRAVENOUS | Status: AC
  Administered 2022-07-25: 400 mg via INTRAVENOUS
  Filled 2022-07-25: qty 200

## 2022-07-25 MED ORDER — METOCLOPRAMIDE HCL 5 MG/ML IJ SOLN
INTRAMUSCULAR | Status: AC
  Filled 2022-07-25: qty 2

## 2022-07-25 NOTE — ED Triage Notes (Signed)
LUQ pain**  Does not want morphine, states previously received antibiotics, phenergan and dilaudid.

## 2022-07-25 NOTE — ED Triage Notes (Signed)
Pt c/o feeling unwell since Thursday. URQ abd pain 10/10, N/V/D. Last normal BM 3/13.  Unable to keep liquids or solids down. Endorses low grade fever in the evening.   Has taken imodium, tylenol.  Hx of diverticulitis, previous colon surgery. States this feels the same as his last flare up.

## 2022-07-25 NOTE — ED Notes (Signed)
Helped patient to the toilet

## 2022-07-25 NOTE — ED Provider Notes (Signed)
[facility] Provider Note   CSN: [id number] Arrival date & time: 07/25/22  1603     History  Chief Complaint  Patient presents with   Abdominal Pain    Andrew Love is a 57 y.o. male.   Abdominal Pain Associated symptoms: diarrhea, fever, nausea and vomiting   Associated symptoms: no chest pain, no dysuria and no shortness of breath        Andrew Love is a 57 y.o. male with past medical history of hypertension, coronary artery disease, recurrent diverticulitis with history of sigmoid colectomy 2012 who presents to the Emergency Department complaining of epigastric and left-sided abdominal pain x 4 to 5 days.  Pain has been gradually worsening since onset.  Over the weekend, patient had max fever of 101.  And persistent watery diarrhea.  Stools have been nonbloody or black.  Few episodes of vomiting over the weekend and again today.  States pain feels similar to his last episode of diverticulitis.  He denies any chest pain or shortness of breath.  No dysuria or flank pain.  No recent antibiotics  Home Medications Prior to Admission medications   Medication Sig Start Date End Date Taking? Authorizing Provider  acetaminophen (TYLENOL) 500 MG tablet Take 500-1,000 mg by mouth every 6 (six) hours as needed for mild pain or fever.    [provider]  amLODipine (NORVASC) 5 MG tablet Take 1 tablet (5 mg total) by mouth daily. 05/22/22 05/17/23  Charlie Pitter, PA-C  aspirin EC 81 MG EC tablet Take 1 tablet (81 mg total) by mouth daily. Swallow whole. Patient taking differently: Take 81 mg by mouth daily as needed. Swallow whole. 06/05/21   Hosie Poisson, MD  baclofen (LIORESAL) 10 MG tablet Take 1 tablet (10 mg total) by mouth 3 (three) times daily. As needed for muscle spasm Patient not taking: Reported on 05/16/2022 07/23/19   Ventura Bruns, PA-C  cholecalciferol (VITAMIN D3) 25 MCG (1000 UNIT) tablet Take 1,000 Units by mouth at  bedtime.    [provider]  diphenhydrAMINE (BENADRYL) 25 MG tablet Take 25-50 mg by mouth every 6 (six) hours as needed for allergies.    [provider]  EPINEPHrine (EPIPEN) 0.3 mg/0.3 mL SOAJ Inject 0.3 mLs (0.3 mg total) into the muscle as needed. Patient taking differently: Inject 0.3 mg into the muscle as needed for anaphylaxis. 12/01/12   Teressa Lower, MD  ibuprofen (ADVIL) 200 MG tablet Take 800 mg by mouth every 8 (eight) hours as needed for moderate pain.    [provider]  isosorbide mononitrate (IMDUR) 30 MG 24 hr tablet Take 1 tablet (30 mg total) by mouth daily. 10/06/21 10/01/22  Strader, Fransisco Hertz, PA-C  levothyroxine (SYNTHROID, LEVOTHROID) 300 MCG tablet Take 300 mcg by mouth every morning.  03/26/14   [provider]  losartan (COZAAR) 100 MG tablet Take 100 mg by mouth daily. 09/26/20   [provider]  meclizine (ANTIVERT) 25 MG tablet Take 1 tablet (25 mg total) by mouth 3 (three) times daily as needed for dizziness. 04/09/17   Isla Pence, MD  nitroGLYCERIN (NITROSTAT) 0.4 MG SL tablet Place 1 tablet (0.4 mg total) under the tongue every 5 (five) minutes as needed for chest pain. Up to 3 tablets then call 911 or go to the nearest ED. 10/06/21 05/16/22  Ahmed Prima, Fransisco Hertz, PA-C  Omega-3 Fatty Acids (FISH OIL) 1000 MG CAPS Take 1,000 mg by mouth daily.  [provider]  omeprazole (PRILOSEC) 40 MG capsule Take 1 capsule (40 mg total) by mouth 2 (two) times daily. 10/17/21 05/16/22  Eloise Harman, DO  ondansetron (ZOFRAN) 4 MG tablet Take 1 tablet (4 mg total) by mouth every 6 (six) hours. Patient taking differently: Take 4 mg by mouth every 8 (eight) hours as needed for nausea or vomiting. 01/04/20   Noemi Chapel, MD  oxyCODONE (ROXICODONE) 5 MG immediate release tablet Take 1 tablet (5 mg total) by mouth every 4 (four) hours as needed for severe pain. 12/10/21   Maudie Flakes, MD  promethazine (PHENERGAN) 25 MG tablet Take 1  tablet (25 mg total) by mouth every 6 (six) hours as needed for nausea or vomiting. 04/10/19   Jacqlyn Larsen, PA-C  Tetrahydroz-Dextran-PEG-Povid (VISINE ADVANCED RELIEF) 0.05-0.1-1-1 % SOLN Place 1 drop into both eyes daily as needed (dry eyes).    [provider]  zolpidem (AMBIEN) 10 MG tablet Take 10 mg by mouth at bedtime. Patient not taking: Reported on 05/16/2022    [provider]      Allergies    Dust mite mixed allergen ext [mite (d. farinae)], Other, Iohexol, Almond (diagnostic), Crestor [rosuvastatin], Oysters [shellfish allergy], Wheat, and Yeast-related products    Review of Systems   Review of Systems  Constitutional:  Positive for appetite change and fever.  Respiratory:  Negative for shortness of breath.   Cardiovascular:  Negative for chest pain.  Gastrointestinal:  Positive for abdominal pain, diarrhea, nausea and vomiting.  Genitourinary:  Negative for difficulty urinating and dysuria.  Musculoskeletal:  Negative for back pain.  Neurological:  Negative for headaches.    Physical Exam Updated Vital Signs BP 126/83   Pulse 73   Temp 98.1 F (36.7 C)   Resp 16   Ht 6' (1.829 m)   Wt 106.1 kg   SpO2 95%   BMI 31.74 kg/m  Physical Exam Vitals and nursing note reviewed.  Constitutional:      General: He is not in acute distress.    Appearance: He is well-developed. He is not toxic-appearing.     Comments: Patient is uncomfortable appearing  HENT:     Mouth/Throat:     Mouth: Mucous membranes are moist.  Cardiovascular:     Rate and Rhythm: Normal rate and regular rhythm.     Pulses: Normal pulses.  Pulmonary:     Effort: Pulmonary effort is normal.  Abdominal:     Palpations: Abdomen is soft.     Tenderness: There is abdominal tenderness.     Comments: Tenderness palpation of the epigastric and left lower quadrant.  Abdomen is soft.  There is no guarding or rebound tenderness.  Musculoskeletal:        General: Normal range of motion.   Skin:    General: Skin is warm.     Capillary Refill: Capillary refill takes less than 2 seconds.  Neurological:     General: No focal deficit present.     Mental Status: He is alert.     Sensory: No sensory deficit.     Motor: No weakness.     ED Results / Procedures / Treatments   Labs (all labs ordered are listed, but only abnormal results are displayed) Labs Reviewed  CBC - Abnormal; Notable for the following components:      Result Value   MCHC 36.1 (*)    All other components within normal limits  BASIC METABOLIC PANEL - Abnormal; Notable for the  following components:   Sodium 131 (*)    Potassium 3.0 (*)    Chloride 95 (*)    Glucose, Bld 138 (*)    Calcium 8.4 (*)    All other components within normal limits  HEPATIC FUNCTION PANEL - Abnormal; Notable for the following components:   Indirect Bilirubin 1.0 (*)    All other components within normal limits  GASTROINTESTINAL PANEL BY PCR, STOOL (REPLACES STOOL CULTURE)  URINALYSIS, ROUTINE W REFLEX MICROSCOPIC  LIPASE, BLOOD    EKG None  Radiology CT ABDOMEN PELVIS WO CONTRAST  Result Date: 07/25/2022 CLINICAL DATA:  Left lower quadrant abdominal pain. EXAM: CT ABDOMEN AND PELVIS WITHOUT CONTRAST TECHNIQUE: Multidetector CT imaging of the abdomen and pelvis was performed following the standard protocol without IV contrast. RADIATION DOSE REDUCTION: This exam was performed according to the departmental dose-optimization program which includes automated exposure control, adjustment of the mA and/or kV according to patient size and/or use of iterative reconstruction technique. COMPARISON:  CT abdomen pelvis dated 04/10/2019. FINDINGS: Evaluation of this exam is limited in the absence of intravenous contrast. Lower chest: The visualized lung bases are clear. No intra-abdominal free air or free fluid. Hepatobiliary: The liver is unremarkable. No biliary ductal dilatation. Cholecystectomy. No retained calcified stone noted in  the central CBD. Pancreas: Unremarkable. No pancreatic ductal dilatation or surrounding inflammatory changes. Spleen: Normal in size without focal abnormality. Adrenals/Urinary Tract: The adrenal glands unremarkable. The kidneys, visualized ureters, and urinary bladder appear unremarkable. Stomach/Bowel: There is sigmoid diverticulosis without active inflammatory changes. 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 The cecum is located in the left lower abdomen. There is mild diffuse thickened appearance of the colon with pericolonic stranding and edema consistent with colitis. Loose stool within the colon consistent with diarrheal state. There is no bowel obstruction. The appendix is not visualized. Vascular/Lymphatic: Mild aortoiliac atherosclerotic disease. The IVC is unremarkable. No portal venous gas. There is no adenopathy. Reproductive: The prostate and seminal vesicles are grossly unremarkable. No pelvic mass. Other: None Musculoskeletal: Bilateral total hip arthroplasties. No acute osseous pathology. IMPRESSION: 1. Pancolitis with diarrheal state. No bowel obstruction. 2. Sigmoid diverticulosis. 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 4.  Aortic Atherosclerosis (ICD10-I70.0). Electronically Signed   By: Anner Crete M.D.   On: 07/25/2022 20:00    Procedures Procedures    Medications Ordered in ED Medications  potassium chloride 10 mEq in 100 mL IVPB (has no administration in time range)  ciprofloxacin (CIPRO) IVPB 400 mg (has no administration in time range)  metroNIDAZOLE (FLAGYL) IVPB 500 mg (has no administration in time range)  HYDROmorphone (DILAUDID) injection 1 mg (1 mg Intravenous Given 07/25/22 1906)  ondansetron (ZOFRAN) injection 4 mg (4 mg Intravenous Given 07/25/22 1907)  HYDROmorphone (DILAUDID) injection 1 mg (1 mg Intravenous Given 07/25/22 2024)  sodium chloride 0.9 % bolus 1,000 mL (0 mLs Intravenous Stopped 07/25/22 2155)  ciprofloxacin (CIPRO) IVPB 400 mg (0  mg Intravenous Stopped 07/25/22 2155)  HYDROmorphone (DILAUDID) injection 0.5 mg (0.5 mg Intravenous Given 07/25/22 2202)  metoCLOPramide (REGLAN) injection 10 mg (10 mg Intravenous Given 07/25/22 2201)    ED Course/ Medical Decision Making/ A&P                             Medical Decision Making Patient here with epigastric and left lower quadrant abdominal pain.  He has history of recurrent diverticulitis and prior sigmoid colectomy in 2012.  Subjective fever  at home, watery brown stools and intermittent vomiting.  States pain feels similar to previous episodes of diverticulitis.  No recent antibiotics  Clinically, suspect this may be recurrent diverticulitis versus colitis, abscess or perforation also considered.  Vital signs are reassuring and is afebrile.  Sepsis felt less likely.  SBO considered given prior surgery but also felt less likely given several days of diarrhea.  Amount and/or Complexity of Data Reviewed Labs: ordered.    Details: Labs interpreted by me, no evidence of leukocytosis, hemoglobin unremarkable.  Chemistries show hypokalemia with potassium of 3 urinalysis without evidence of infection.  Liver functions unremarkable.  Lipase also unremarkable.  Hypokalemia felt to be secondary to his diarrhea. Radiology: ordered.    Details: CT abdomen and pelvis shows pancolitis with diarrheal state no bowel obstruction Discussion of management or test interpretation with external provider(s): Patient has remained uncomfortable despite several rounds of IV hydromorphone.  Nausea somewhat improved after Zofran and Reglan.  He has tolerated few sips of ginger ale.  He continues to have diarrhea here in the ER.  He has been given IV Cipro here.  Given his continued pain and diarrhea I feel that patient would benefit from hospital admission.  He is agreeable to this plan.  Discussed findings with [facility] hospitalist, Dr. Clearence Ped who agrees to admit.   Risk Prescription drug  management. Decision regarding hospitalization.           Final Clinical Impression(s) / ED Diagnoses Final diagnoses:  Pancolitis [facility])    Rx / DC Orders ED Discharge Orders     None         Kem Parkinson, PA-C 07/25/22 2319    Godfrey Pick, MD 07/26/22 [phone]

## 2022-07-26 DIAGNOSIS — Z79899 Other long term (current) drug therapy: Secondary | ICD-10-CM | POA: Diagnosis not present

## 2022-07-26 DIAGNOSIS — L409 Psoriasis, unspecified: Secondary | ICD-10-CM

## 2022-07-26 DIAGNOSIS — R112 Nausea with vomiting, unspecified: Secondary | ICD-10-CM | POA: Diagnosis not present

## 2022-07-26 DIAGNOSIS — K219 Gastro-esophageal reflux disease without esophagitis: Secondary | ICD-10-CM

## 2022-07-26 DIAGNOSIS — E876 Hypokalemia: Secondary | ICD-10-CM

## 2022-07-26 DIAGNOSIS — K529 Noninfective gastroenteritis and colitis, unspecified: Secondary | ICD-10-CM | POA: Diagnosis present

## 2022-07-26 DIAGNOSIS — E785 Hyperlipidemia, unspecified: Secondary | ICD-10-CM

## 2022-07-26 DIAGNOSIS — Z96642 Presence of left artificial hip joint: Secondary | ICD-10-CM | POA: Diagnosis present

## 2022-07-26 DIAGNOSIS — E039 Hypothyroidism, unspecified: Secondary | ICD-10-CM | POA: Diagnosis not present

## 2022-07-26 DIAGNOSIS — G8929 Other chronic pain: Secondary | ICD-10-CM | POA: Diagnosis present

## 2022-07-26 DIAGNOSIS — K5732 Diverticulitis of large intestine without perforation or abscess without bleeding: Secondary | ICD-10-CM

## 2022-07-26 DIAGNOSIS — Z981 Arthrodesis status: Secondary | ICD-10-CM | POA: Diagnosis not present

## 2022-07-26 DIAGNOSIS — I2510 Atherosclerotic heart disease of native coronary artery without angina pectoris: Secondary | ICD-10-CM

## 2022-07-26 DIAGNOSIS — E890 Postprocedural hypothyroidism: Secondary | ICD-10-CM | POA: Diagnosis present

## 2022-07-26 DIAGNOSIS — R110 Nausea: Secondary | ICD-10-CM | POA: Diagnosis not present

## 2022-07-26 DIAGNOSIS — E869 Volume depletion, unspecified: Secondary | ICD-10-CM | POA: Diagnosis present

## 2022-07-26 DIAGNOSIS — I25118 Atherosclerotic heart disease of native coronary artery with other forms of angina pectoris: Secondary | ICD-10-CM

## 2022-07-26 DIAGNOSIS — Z9049 Acquired absence of other specified parts of digestive tract: Secondary | ICD-10-CM | POA: Diagnosis not present

## 2022-07-26 DIAGNOSIS — Z79890 Hormone replacement therapy (postmenopausal): Secondary | ICD-10-CM | POA: Diagnosis not present

## 2022-07-26 DIAGNOSIS — K5100 Ulcerative (chronic) pancolitis without complications: Secondary | ICD-10-CM | POA: Diagnosis not present

## 2022-07-26 DIAGNOSIS — Z86010 Personal history of colonic polyps: Secondary | ICD-10-CM | POA: Diagnosis not present

## 2022-07-26 DIAGNOSIS — M1631 Unilateral osteoarthritis resulting from hip dysplasia, right hip: Secondary | ICD-10-CM | POA: Diagnosis present

## 2022-07-26 DIAGNOSIS — Z7982 Long term (current) use of aspirin: Secondary | ICD-10-CM | POA: Diagnosis not present

## 2022-07-26 DIAGNOSIS — I10 Essential (primary) hypertension: Secondary | ICD-10-CM

## 2022-07-26 DIAGNOSIS — Z8701 Personal history of pneumonia (recurrent): Secondary | ICD-10-CM | POA: Diagnosis not present

## 2022-07-26 DIAGNOSIS — G4730 Sleep apnea, unspecified: Secondary | ICD-10-CM

## 2022-07-26 DIAGNOSIS — A045 Campylobacter enteritis: Secondary | ICD-10-CM | POA: Diagnosis not present

## 2022-07-26 DIAGNOSIS — Z87891 Personal history of nicotine dependence: Secondary | ICD-10-CM | POA: Diagnosis not present

## 2022-07-26 DIAGNOSIS — E871 Hypo-osmolality and hyponatremia: Secondary | ICD-10-CM | POA: Diagnosis present

## 2022-07-26 DIAGNOSIS — K2100 Gastro-esophageal reflux disease with esophagitis, without bleeding: Secondary | ICD-10-CM | POA: Diagnosis present

## 2022-07-26 DIAGNOSIS — K5730 Diverticulosis of large intestine without perforation or abscess without bleeding: Secondary | ICD-10-CM | POA: Diagnosis present

## 2022-07-26 DIAGNOSIS — Z1152 Encounter for screening for COVID-19: Secondary | ICD-10-CM | POA: Diagnosis not present

## 2022-07-26 LAB — CBC WITH DIFFERENTIAL/PLATELET
Abs Immature Granulocytes: 0.07 10*3/uL (ref 0.00–0.07)
Basophils Absolute: 0.1 10*3/uL (ref 0.0–0.1)
Basophils Relative: 1 %
Eosinophils Absolute: 0.4 10*3/uL (ref 0.0–0.5)
Eosinophils Relative: 4 %
HCT: 36.2 % — ABNORMAL LOW (ref 39.0–52.0)
Hemoglobin: 12.8 g/dL — ABNORMAL LOW (ref 13.0–17.0)
Immature Granulocytes: 1 %
Lymphocytes Relative: 21 %
Lymphs Abs: 1.9 10*3/uL (ref 0.7–4.0)
MCH: 31.1 pg (ref 26.0–34.0)
MCHC: 35.4 g/dL (ref 30.0–36.0)
MCV: 87.9 fL (ref 80.0–100.0)
Monocytes Absolute: 1 10*3/uL (ref 0.1–1.0)
Monocytes Relative: 11 %
Neutro Abs: 5.6 10*3/uL (ref 1.7–7.7)
Neutrophils Relative %: 62 %
Platelets: 223 10*3/uL (ref 150–400)
RBC: 4.12 MIL/uL — ABNORMAL LOW (ref 4.22–5.81)
RDW: 12.4 % (ref 11.5–15.5)
WBC: 9 10*3/uL (ref 4.0–10.5)
nRBC: 0 % (ref 0.0–0.2)

## 2022-07-26 LAB — COMPREHENSIVE METABOLIC PANEL
ALT: 30 U/L (ref 0–44)
AST: 24 U/L (ref 15–41)
Albumin: 3 g/dL — ABNORMAL LOW (ref 3.5–5.0)
Alkaline Phosphatase: 98 U/L (ref 38–126)
Anion gap: 9 (ref 5–15)
BUN: 8 mg/dL (ref 6–20)
CO2: 24 mmol/L (ref 22–32)
Calcium: 7.9 mg/dL — ABNORMAL LOW (ref 8.9–10.3)
Chloride: 99 mmol/L (ref 98–111)
Creatinine, Ser: 0.98 mg/dL (ref 0.61–1.24)
GFR, Estimated: >60 mL/min (ref >60)
Glucose, Bld: 137 mg/dL — ABNORMAL HIGH (ref 70–99)
Potassium: 3.1 mmol/L — ABNORMAL LOW (ref 3.5–5.1)
Sodium: 132 mmol/L — ABNORMAL LOW (ref 135–145)
Total Bilirubin: 1 mg/dL (ref 0.3–1.2)
Total Protein: 6.1 g/dL — ABNORMAL LOW (ref 6.5–8.1)

## 2022-07-26 LAB — C DIFFICILE QUICK SCREEN W PCR REFLEX
C Diff antigen: NEGATIVE
C Diff interpretation: NOT DETECTED
C Diff toxin: NEGATIVE

## 2022-07-26 LAB — RESP PANEL BY RT-PCR (RSV, FLU A&B, COVID)  RVPGX2
Influenza A by PCR: NEGATIVE
Influenza B by PCR: NEGATIVE
Resp Syncytial Virus by PCR: NEGATIVE
SARS Coronavirus 2 by RT PCR: NEGATIVE

## 2022-07-26 LAB — HIV ANTIBODY (ROUTINE TESTING W REFLEX): HIV Screen 4th Generation wRfx: NONREACTIVE

## 2022-07-26 LAB — MAGNESIUM: Magnesium: 1.9 mg/dL (ref 1.7–2.4)

## 2022-07-26 MED ORDER — AMLODIPINE BESYLATE 5 MG PO TABS
5.0000 mg | ORAL_TABLET | Freq: Every day | ORAL | Status: DC
  Administered 2022-07-27 – 2022-07-29 (×3): 5 mg via ORAL
  Filled 2022-07-26 (×3): qty 1

## 2022-07-26 MED ORDER — POTASSIUM CHLORIDE 10 MEQ/100ML IV SOLN
10.0000 meq | INTRAVENOUS | Status: AC
  Administered 2022-07-26 (×2): 10 meq via INTRAVENOUS
  Filled 2022-07-26 (×2): qty 100

## 2022-07-26 MED ORDER — DIPHENHYDRAMINE HCL 25 MG PO CAPS: 25.0000 mg | ORAL_CAPSULE | Freq: Four times a day (QID) | ORAL | Status: DC | PRN

## 2022-07-26 MED ORDER — PANTOPRAZOLE SODIUM 40 MG IV SOLR
40.0000 mg | Freq: Two times a day (BID) | INTRAVENOUS | Status: DC
  Administered 2022-07-26 – 2022-07-29 (×8): 40 mg via INTRAVENOUS
  Filled 2022-07-26 (×8): qty 10

## 2022-07-26 MED ORDER — LOSARTAN POTASSIUM 50 MG PO TABS: 100.0000 mg | ORAL_TABLET | Freq: Every day | ORAL | Status: DC

## 2022-07-26 MED ORDER — SODIUM CHLORIDE 0.9 % IV SOLN: INTRAVENOUS | Status: DC

## 2022-07-26 MED ORDER — ISOSORBIDE MONONITRATE ER 60 MG PO TB24
30.0000 mg | ORAL_TABLET | Freq: Every day | ORAL | Status: DC
  Administered 2022-07-27 – 2022-07-29 (×3): 30 mg via ORAL
  Filled 2022-07-26 (×3): qty 1

## 2022-07-26 MED ORDER — MECLIZINE HCL 12.5 MG PO TABS: 25.0000 mg | ORAL_TABLET | Freq: Three times a day (TID) | ORAL | Status: DC | PRN

## 2022-07-26 MED ORDER — LEVOTHYROXINE SODIUM 100 MCG PO TABS
300.0000 ug | ORAL_TABLET | Freq: Every day | ORAL | Status: DC
  Administered 2022-07-27 – 2022-07-29 (×3): 300 ug via ORAL
  Filled 2022-07-26 (×3): qty 3

## 2022-07-26 MED ORDER — LEVOTHYROXINE SODIUM 100 MCG PO TABS: 300.0000 ug | ORAL_TABLET | Freq: Every morning | ORAL | Status: DC

## 2022-07-26 MED ORDER — ONDANSETRON HCL 4 MG/2ML IJ SOLN
4.0000 mg | Freq: Four times a day (QID) | INTRAMUSCULAR | Status: DC | PRN
  Administered 2022-07-26 – 2022-07-28 (×7): 4 mg via INTRAVENOUS
  Filled 2022-07-26 (×7): qty 2

## 2022-07-26 MED ORDER — ONDANSETRON HCL 4 MG PO TABS
4.0000 mg | ORAL_TABLET | Freq: Four times a day (QID) | ORAL | Status: DC | PRN
  Administered 2022-07-26: 4 mg via ORAL
  Filled 2022-07-26: qty 1

## 2022-07-26 MED ORDER — HYDROMORPHONE HCL 1 MG/ML IJ SOLN
0.5000 mg | INTRAMUSCULAR | Status: DC | PRN
  Administered 2022-07-26 – 2022-07-29 (×13): 0.5 mg via INTRAVENOUS
  Filled 2022-07-26 (×13): qty 0.5

## 2022-07-26 MED ORDER — OXYCODONE HCL 5 MG PO TABS
5.0000 mg | ORAL_TABLET | ORAL | Status: DC | PRN
  Administered 2022-07-26 – 2022-07-29 (×7): 5 mg via ORAL
  Filled 2022-07-26 (×7): qty 1

## 2022-07-26 MED ORDER — MORPHINE SULFATE (PF) 4 MG/ML IV SOLN
4.0000 mg | INTRAVENOUS | Status: DC | PRN
  Administered 2022-07-26: 4 mg via INTRAVENOUS
  Filled 2022-07-26: qty 1

## 2022-07-26 MED ORDER — ACETAMINOPHEN 325 MG PO TABS
650.0000 mg | ORAL_TABLET | Freq: Four times a day (QID) | ORAL | Status: DC | PRN
  Administered 2022-07-27: 650 mg via ORAL
  Filled 2022-07-26: qty 2

## 2022-07-26 MED ORDER — ACETAMINOPHEN 650 MG RE SUPP: 650.0000 mg | Freq: Four times a day (QID) | RECTAL | Status: DC | PRN

## 2022-07-26 MED ORDER — POTASSIUM CHLORIDE IN NACL 40-0.9 MEQ/L-% IV SOLN: INTRAVENOUS | Status: AC

## 2022-07-26 MED ORDER — HEPARIN SODIUM (PORCINE) 5000 UNIT/ML IJ SOLN
5000.0000 [IU] | Freq: Three times a day (TID) | INTRAMUSCULAR | Status: DC
  Administered 2022-07-26 – 2022-07-29 (×10): 5000 [IU] via SUBCUTANEOUS
  Filled 2022-07-26 (×10): qty 1

## 2022-07-26 NOTE — H&P (Signed)
History and Physical    Patient: Andrew Love [id number] DOB: 01/30/1966 DOA: 07/25/2022 DOS: the patient was seen and examined on 07/26/2022 [facility]  Patient coming from: Home  Chief Complaint:  Chief Complaint  Patient presents with   Abdominal Pain   HPI: Andrew Love is a 57 y.o. male with medical history significant of coronary artery disease, colitis, diverticulitis, GERD, hyperlipidemia, hypertension, hypothyroidism, and more presents the ED with a chief complaint of abdominal pain.  Patient reports she has had abdominal pain, diarrhea, fatigue since Thursday.  Reports diarrhea is 11 times per day.  It is nonbloody.  He had some nausea and vomiting as well.  His last nausea and vomiting was the day of presentation.  The vomitus appears as yellow phlegm or what ever color Gatorade he has recently had to drink.  Patient reports he is only had 2 pieces of bread to eat in the last week.  Reports his last normal meal was Thursday 6 days ago.  His last normal bowel movement was a week ago Wednesday.  Patient reports that his pain in his abdomen is sharp and twisting.  It is worse with positional changes.  It is better when he lays on his side or in the fetal position.  He has tried Tylenol and ibuprofen without any relief.  He has had a fever at home with a Tmax of 102 on Saturday.  He took Tylenol for that which did help.  His last elevated temperature was Monday when it was 100.1.  Patient reports no recent antibiotics.  He initially thought this was food poisoning, but when the pain came on it felt exactly like when he had diverticulitis.  She has no other complaints at this time.  Patient does not smoke, does not drink, is not vaccinated for flu or COVID.  Patient is full code. Review of Systems: As mentioned in the history of present illness. All other systems reviewed and are negative. Past Medical History:  Diagnosis Date   Arthritis    CAD (coronary  artery disease)    Chronic back pain    Chronic left hip pain    Colitis    Per medical history from dated 06/13/10.   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   Sigmoid colon ulcer    Rectal polyps   Sleep apnea with use of continuous positive airway pressure (CPAP)    Urticaria    Past Surgical History:  Procedure Laterality Date   APPENDECTOMY     8th grade   BACK SURGERY     BIOPSY  10/17/2021   Procedure: BIOPSY;  Surgeon: Eloise Harman, DO;  Location: [facility];  Service: Endoscopy;;   CARDIAC CATHETERIZATION  2009   CARDIAC CATHETERIZATION     Per medical history from dated 06/13/10.   CERVICAL SPINE SURGERY     C4, C5, C6 spinal fusion   CHOLECYSTECTOMY N/A 08/31/2015   Procedure: LAPAROSCOPIC CHOLECYSTECTOMY WITH INTRAOPERATIVE CHOLANGIOGRAM;  Surgeon: Excell Seltzer, MD;  Location: [facility];  Service: General;  Laterality: N/A;   COLONOSCOPY  07/2008   Colitis,NSAID v.  Ischemia,malrotation of the gut,Diverticulosis(L),hyperplastic   COLONOSCOPY N/A 12/13/2012   Dr. Oneida Alar: Normal TI, mild sigmoid diverticulosis, hemorrhoids, 2 polyps (tubular adenoma). Random colon bx negative. Next TCS 12/2022 with Fentanyl/phenergan   COLONOSCOPY WITH PROPOFOL N/A 10/17/2021   Procedure: COLONOSCOPY WITH PROPOFOL;  Surgeon: Eloise Harman, DO;  [facility]: [facility];  Service: Endoscopy;  Laterality: N/A;  10:15am   ESOPHAGOGASTRODUODENOSCOPY N/A 04/16/2014   RMR: Erosive reflux esophagitis. Non critical Schzki's ring not manipulated. Small hiatal hernia. Abnormal gastirc mucosa of doubtful signigicance status post biopsy. I suspect trivial upper GI bleed. Recent abdominal pain  presumably secondary to a protracted bout of diverticulitis. CT scan November 23 revealed improvemetn without complication. He finished his antibiotics 2 days age. Left sided ab   ESOPHAGOGASTRODUODENOSCOPY (EGD) WITH PROPOFOL N/A 10/17/2021   Procedure: ESOPHAGOGASTRODUODENOSCOPY (EGD) WITH PROPOFOL;  Surgeon: Eloise Harman, DO;  Location: [facility];  Service: Endoscopy;  Laterality: N/A;   JOINT REPLACEMENT     LAPAROSCOPIC SIGMOID COLECTOMY  2012   Dr. Fanny Skates: recurrent sigmoid diverticulitis   LAPAROSCOPIC SIGMOID COLECTOMY  2012   recurernt sigmoid diverticulitis, Dr. Dalbert Batman    LEFT HEART CATH AND CORONARY ANGIOGRAPHY N/A 06/03/2021   Procedure: LEFT HEART CATH AND CORONARY ANGIOGRAPHY;  Surgeon: Early Osmond, MD;  Location: [place] [facility];  Service: Cardiovascular;  Laterality: N/A;   POLYPECTOMY  10/17/2021   Procedure: POLYPECTOMY;  Surgeon: Eloise Harman, DO;  Location: [facility];  Service: Endoscopy;;   ROTATOR CUFF REPAIR     Left - per medical history form dated 06/13/10.   SHOULDER SURGERY     Left   THYROIDECTOMY     Per medical history form dated 06/13/10.   TOTAL HIP ARTHROPLASTY  07/04/2011   Procedure: TOTAL HIP ARTHROPLASTY;  Surgeon: Johnny Bridge, MD;  Location: [place];  Service: Orthopedics;  Laterality: Right;   TOTAL HIP ARTHROPLASTY Left 07/22/2019   Procedure: TOTAL HIP ARTHROPLASTY;  Surgeon: Marchia Bond, MD;  Location: [facility];  Service: Orthopedics;  Laterality: Left;   Social History:  reports that he quit smoking about 3 years ago. His smoking use included cigarettes. He has a 9.00 pack-year smoking history. He has never used smokeless tobacco. He reports that he does not drink alcohol and does not use drugs.  Allergies  Allergen Reactions   Dust Mite Mixed Allergen Ext [Mite (D. Farinae)] Anaphylaxis and Hives   Other Hives and Shortness Of Breath    Cockroaches   Iohexol Hives   Almond (Diagnostic) Hives   Crestor  [Rosuvastatin]     Muscle/joint pain    Oysters [Shellfish Allergy] Hives   Wheat Hives   Yeast-Related Products Hives    Family History  Problem Relation Age of Onset   Cancer Mother        breast cancer - per medical history form dated 06/13/10.   Diverticulitis Mother    Hypertension Mother    Cancer Father        skin - per medical history form dated 06/13/10.   Hypertension Father    Anesthesia problems Neg Hx    Hypotension Neg Hx    Malignant hyperthermia Neg Hx    Pseudochol deficiency Neg Hx    Colon cancer Neg Hx     Prior to Admission medications   Medication Sig Start Date End Date Taking? Authorizing Provider  acetaminophen (TYLENOL) 500 MG tablet Take 500-1,000 mg by mouth every 6 (six) hours as needed for mild  pain or fever.    [provider]  amLODipine (NORVASC) 5 MG tablet Take 1 tablet (5 mg total) by mouth daily. 05/22/22 05/17/23  Charlie Pitter, PA-C  aspirin EC 81 MG EC tablet Take 1 tablet (81 mg total) by mouth daily. Swallow whole. Patient taking differently: Take 81 mg by mouth daily as needed. Swallow whole. 06/05/21   Hosie Poisson, MD  baclofen (LIORESAL) 10 MG tablet Take 1 tablet (10 mg total) by mouth 3 (three) times daily. As needed for muscle spasm Patient not taking: Reported on 05/16/2022 07/23/19   Ventura Bruns, PA-C  cholecalciferol (VITAMIN D3) 25 MCG (1000 UNIT) tablet Take 1,000 Units by mouth at bedtime.    [provider]  diphenhydrAMINE (BENADRYL) 25 MG tablet Take 25-50 mg by mouth every 6 (six) hours as needed for allergies.    [provider]  EPINEPHrine (EPIPEN) 0.3 mg/0.3 mL SOAJ Inject 0.3 mLs (0.3 mg total) into the muscle as needed. Patient taking differently: Inject 0.3 mg into the muscle as needed for anaphylaxis. 12/01/12   Teressa Lower, MD  ibuprofen (ADVIL) 200 MG tablet Take 800 mg by mouth every 8 (eight) hours as needed for moderate pain.    [provider]  isosorbide mononitrate (IMDUR) 30  MG 24 hr tablet Take 1 tablet (30 mg total) by mouth daily. 10/06/21 10/01/22  Strader, Fransisco Hertz, PA-C  levothyroxine (SYNTHROID, LEVOTHROID) 300 MCG tablet Take 300 mcg by mouth every morning.  03/26/14   [provider]  losartan (COZAAR) 100 MG tablet Take 100 mg by mouth daily. 09/26/20   [provider]  meclizine (ANTIVERT) 25 MG tablet Take 1 tablet (25 mg total) by mouth 3 (three) times daily as needed for dizziness. 04/09/17   Isla Pence, MD  nitroGLYCERIN (NITROSTAT) 0.4 MG SL tablet Place 1 tablet (0.4 mg total) under the tongue every 5 (five) minutes as needed for chest pain. Up to 3 tablets then call 911 or go to the nearest ED. 10/06/21 05/16/22  Ahmed Prima, Fransisco Hertz, PA-C  Omega-3 Fatty Acids (FISH OIL) 1000 MG CAPS Take 1,000 mg by mouth daily.    [provider]  omeprazole (PRILOSEC) 40 MG capsule Take 1 capsule (40 mg total) by mouth 2 (two) times daily. 10/17/21 05/16/22  Eloise Harman, DO  ondansetron (ZOFRAN) 4 MG tablet Take 1 tablet (4 mg total) by mouth every 6 (six) hours. Patient taking differently: Take 4 mg by mouth every 8 (eight) hours as needed for nausea or vomiting. 01/04/20   Noemi Chapel, MD  oxyCODONE (ROXICODONE) 5 MG immediate release tablet Take 1 tablet (5 mg total) by mouth every 4 (four) hours as needed for severe pain. 12/10/21   Maudie Flakes, MD  promethazine (PHENERGAN) 25 MG tablet Take 1 tablet (25 mg total) by mouth every 6 (six) hours as needed for nausea or vomiting. 04/10/19   Jacqlyn Larsen, PA-C  Tetrahydroz-Dextran-PEG-Povid (VISINE ADVANCED RELIEF) 0.05-0.1-1-1 % SOLN Place 1 drop into both eyes daily as needed (dry eyes).    [provider]  zolpidem (AMBIEN) 10 MG tablet Take 10 mg by mouth at bedtime. Patient not taking: Reported on 05/16/2022    [provider]    Physical Exam: Vitals:   07/25/22 2203 07/25/22 2211 07/26/22 0024 07/26/22 0355  BP:   (!) 143/87 115/66  Pulse:  72 70 67  Resp:    17 17  Temp: 98.1 F (36.7 C)  98 F (36.7 C)  97.6 F (36.4 C)  TempSrc:   Oral Oral  SpO2:  95% 95% 96%  Weight:      Height:       1.  General: Patient lying supine in bed,  no acute distress   2. Psychiatric: Alert and oriented x 3, mood and behavior normal for situation, pleasant and cooperative with exam   3. Neurologic: Speech and language are normal, face is symmetric, moves all 4 extremities voluntarily, at baseline without acute deficits on limited exam   4. HEENMT:  Head is atraumatic, normocephalic, pupils reactive to light, neck is supple, trachea is midline, mucous membranes are moist   5. Respiratory : Lungs are clear to auscultation bilaterally without wheezing, rhonchi, rales, no cyanosis, no increase in work of breathing or accessory muscle use   6. Cardiovascular : Heart rate normal, rhythm is regular, no murmurs, rubs or gallops, no peripheral edema, peripheral pulses palpated   7. Gastrointestinal:  Abdomen is soft, nondistended, more tender on the left than the right but diffusely tender, bowel sounds active, no masses or organomegaly palpated   8. Skin:  Skin is warm, dry and intact without rashes, acute lesions, or ulcers on limited exam   9.Musculoskeletal:  No acute deformities or trauma, no asymmetry in tone, no peripheral edema, peripheral pulses palpated, no tenderness to palpation in the extremities  Data Reviewed: In the ED Temp 98.1, heart rate 73-82, respiratory rate 16-20, blood pressure 126/83-133/94, satting 95% No leukocytosis with a white blood cell count 9.9, hemoglobin 15.1, platelets 249 Hypokalemia at 3.0 secondary to poor p.o. intake and GI losses Glucose 138 UA is not indicative of UTI CT abdomen pelvis shows pancolitis with diarrhea, sigmoid diverticulosis, congenital malrotation of small bowel Patient was given 1 mg of Dilaudid x 2 and 0.5 mg of Dilaudid x 1 Reglan, Zofran, 1 L normal saline given in the ED Admission  requested for pancolitis  Assessment and Plan: * Colitis - Pancolitis on CT abdomen pelvis - Cipro started in the ED, continue Cipro - Add on Flagyl - Stool pathogens pending - No recent antibiotic use - History of partial bowel resection secondary to diverticulitis - Pain control with pain scale - Continue IV fluids - N.p.o. except for sips and chips - Continue to monitor  Hypokalemia - Potassium 3.0 - 40 mEq of potassium ordered at admission - Recheck in the a.m. - Continue to monitor  Hypothyroidism - Continue Synthroid  HLD (hyperlipidemia) - Diet controlled at baseline - Currently n.p.o. - When patient is able to resume diet, recommend heart healthy - Continue to monitor  Essential hypertension - Continue losartan, amlodipine - Continue to monitor  CAD (coronary artery disease) - Continue Imdur and ARB      Advance Care Planning:   Code Status: Full Code  Consults: No consults  Family Communication: No family at bedside  Severity of Illness: The appropriate patient status for this patient is OBSERVATION. Observation status is judged to be reasonable and necessary in order to provide the required intensity of service to ensure the patient's safety. The patient's presenting symptoms, physical exam findings, and initial radiographic and laboratory data in the context of their medical condition is felt to place them at decreased risk for further clinical deterioration. Furthermore, it is anticipated that the patient will be medically stable for discharge from the hospital within 2 midnights of admission.   Author: Rolla Plate, DO 07/26/2022 6:08 AM  For on call review [email or web address].

## 2022-07-26 NOTE — Assessment & Plan Note (Signed)
-   Continue losartan, amlodipine - Continue to monitor

## 2022-07-26 NOTE — Assessment & Plan Note (Signed)
-   Continue Imdur and ARB

## 2022-07-26 NOTE — Assessment & Plan Note (Signed)
-   Potassium 3.0 - 40 mEq of potassium ordered at admission - Recheck in the a.m. - Continue to monitor

## 2022-07-26 NOTE — Progress Notes (Signed)
PROGRESS NOTE  Andrew Love [id number] DOB: 1965/09/29 DOA: 07/25/2022 [facility]  Brief History:  57 year old male with a history of diverticulitis status post sigmoid colectomy 2012, hypertension, hyperlipidemia, coronary disease presenting with 1 week history of diarrhea, nausea, vomiting, and abdominal pain.  The patient began developing fevers up to 101.7 F on 07/21/2022.  Since then, his fevers are trending down with a Tmax up to 100.0.  He denies any hematochezia or melena.  There is no hematemesis.  He denies any recent travels.  He denies any recent antibiotics.  He denies any sick contacts.  The patient states that he ate a hotdog at a local establishment prior to development of his symptoms.  He denies any new medications. In the ED, the patient was afebrile and hemodynamically stable.  WBC 9.9, hemoglobin 10.1, platelets 223,000.  CT of the abdomen and pelvis showed pancolitis with loose stool.  There is sigmoid diverticulosis.  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  C. difficile and stool pathogen panel were ordered.   Assessment/Plan: Colitis/Diarrhea -stool for cdiff -stool pathogen panel -check COVID -3/19 CT abd as discussed above -continue empiric cipro and flagyl  Intractable vomiting -likely gastroenteritis -pantoprazole bid -clear liquids  Hypokalemia -add KCl to IVF -check mag  Hyponatremia -due to volume depletion and poor solute intake  coronary artery disease -no chest pain -continue imdur  Essential HTN -continue amlodipine and losartan  Hypothyroidism -continue synthroid         Family Communication: no  Family at bedside  Consultants:  GI  Code Status:  FULL   DVT Prophylaxis:  [place] Heparin    Procedures: As Listed in Progress Note Above  Antibiotics: Cipro 3/19>> Flagyl 3/19>>   Total time spent 50 minutes.  Greater than 50% spent face to face counseling and coordinating  care.   Subjective: Pt continues to have generalized abd pain and loose stool.  Denies f/c, cp, sob, vomiting, hematochezia and melena  Objective: Vitals:   07/25/22 2203 07/25/22 2211 07/26/22 0024 07/26/22 0355  BP:   (!) 143/87 115/66  Pulse:  72 70 67  Resp:   17 17  Temp: 98.1 F (36.7 C)  98 F (36.7 C) 97.6 F (36.4 C)  TempSrc:   Oral Oral  SpO2:  95% 95% 96%  Weight:      Height:        Intake/Output Summary (Last 24 hours) at 07/26/2022 1032 Last data filed at 07/26/2022 0600 Gross per 24 hour  Intake 658.34 ml  Output --  Net 658.34 ml   Weight change:  Exam:  General:  Pt is alert, follows commands appropriately, not in acute distress HEENT: No icterus, No thrush, No neck mass, [place]/AT Cardiovascular: RRR, S1/S2, no rubs, no gallops Respiratory: CTA bilaterally, no wheezing, no crackles, no rhonchi Abdomen: Soft/+BS, generalized tender, non distended, no guarding Extremities: No edema, No lymphangitis, No petechiae, No rashes, no synovitis   Data Reviewed: I have personally reviewed following labs and imaging studies Basic Metabolic Panel: Recent Labs  Lab 07/25/22 1834 07/26/22 0426  NA 131* 132*  K 3.0* 3.1*  CL 95* 99  CO2 25 24  GLUCOSE 138* 137*  BUN 8 8  CREATININE 1.04 0.98  CALCIUM 8.4* 7.9*  MG  --  1.9   Liver Function Tests: Recent Labs  Lab 07/25/22 1833 07/26/22 0426  AST 26 24  ALT 37 30  ALKPHOS 117 98  BILITOT 1.2 1.0  PROT 7.3 6.1*  ALBUMIN 3.7 3.0*   Recent Labs  Lab 07/25/22 1833  LIPASE 21   No results for input(s): "AMMONIA" in the last 168 hours. Coagulation Profile: No results for input(s): "INR", "PROTIME" in the last 168 hours. CBC: Recent Labs  Lab 07/25/22 1834 07/26/22 0426  WBC 9.9 9.0  NEUTROABS  --  5.6  HGB 15.1 12.8*  HCT 41.8 36.2*  MCV 86.7 87.9  PLT 249 223   Cardiac Enzymes: No results for input(s): "CKTOTAL", "CKMB", "CKMBINDEX", "TROPONINI" in the last 168 hours. BNP: Invalid  input(s): "POCBNP" CBG: No results for input(s): "GLUCAP" in the last 168 hours. HbA1C: No results for input(s): "HGBA1C" in the last 72 hours. Urine analysis:    Component Value Date/Time   COLORURINE YELLOW 07/25/2022 1919   APPEARANCEUR CLEAR 07/25/2022 1919   LABSPEC 1.015 07/25/2022 1919   PHURINE 6.0 07/25/2022 1919   GLUCOSEU NEGATIVE 07/25/2022 1919   HGBUR NEGATIVE 07/25/2022 [place] NEGATIVE 07/25/2022 1919   KETONESUR NEGATIVE 07/25/2022 1919   PROTEINUR NEGATIVE 07/25/2022 1919   UROBILINOGEN 0.2 03/31/2014 0150   NITRITE NEGATIVE 07/25/2022 1919   LEUKOCYTESUR NEGATIVE 07/25/2022 1919   Sepsis Labs: @LABRCNTIP (procalcitonin:4,lacticidven:4) )No results found for this or any previous visit (from the past 240 hour(s)).   Scheduled Meds:  amLODipine  5 mg Oral Daily   heparin  5,000 Units Subcutaneous Q8H   isosorbide mononitrate  30 mg Oral Daily   levothyroxine  300 mcg Oral q morning   losartan  100 mg Oral Daily   pantoprazole (PROTONIX) IV  40 mg Intravenous Q12H   Continuous Infusions:  sodium chloride 75 mL/hr at 07/26/22 0150   ciprofloxacin 400 mg (07/26/22 1027)   metronidazole 500 mg (07/26/22 0243)    Procedures/Studies: CT ABDOMEN PELVIS WO CONTRAST  Result Date: 07/25/2022 CLINICAL DATA:  Left lower quadrant abdominal pain. EXAM: CT ABDOMEN AND PELVIS WITHOUT CONTRAST TECHNIQUE: Multidetector CT imaging of the abdomen and pelvis was performed following the standard protocol without IV contrast. RADIATION DOSE REDUCTION: This exam was performed according to the departmental dose-optimization program which includes automated exposure control, adjustment of the mA and/or kV according to patient size and/or use of iterative reconstruction technique. COMPARISON:  CT abdomen pelvis dated 04/10/2019. FINDINGS: Evaluation of this exam is limited in the absence of intravenous contrast. Lower chest: The visualized lung bases are clear. No  intra-abdominal free air or free fluid. Hepatobiliary: The liver is unremarkable. No biliary ductal dilatation. Cholecystectomy. No retained calcified stone noted in the central CBD. Pancreas: Unremarkable. No pancreatic ductal dilatation or surrounding inflammatory changes. Spleen: Normal in size without focal abnormality. Adrenals/Urinary Tract: The adrenal glands unremarkable. The kidneys, visualized ureters, and urinary bladder appear unremarkable. Stomach/Bowel: There is sigmoid diverticulosis without active inflammatory changes. 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 The cecum is located in the left lower abdomen. There is mild diffuse thickened appearance of the colon with pericolonic stranding and edema consistent with colitis. Loose stool within the colon consistent with diarrheal state. There is no bowel obstruction. The appendix is not visualized. Vascular/Lymphatic: Mild aortoiliac atherosclerotic disease. The IVC is unremarkable. No portal venous gas. There is no adenopathy. Reproductive: The prostate and seminal vesicles are grossly unremarkable. No pelvic mass. Other: None Musculoskeletal: Bilateral total hip arthroplasties. No acute osseous pathology. IMPRESSION: 1. Pancolitis with diarrheal state. No bowel obstruction. 2. Sigmoid diverticulosis. 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 4.  Aortic Atherosclerosis (ICD10-I70.0). Electronically  Signed   By: Anner Crete M.D.   On: 07/25/2022 20:00    Orson Eva, DO  [facility]  If 7PM-7AM, please contact night-coverage [email or web address] Password [email or web address] 07/26/2022, 10:32 AM   LOS: 0 days

## 2022-07-26 NOTE — TOC Initial Note (Signed)
Transition of Care [facility]) - Initial/Assessment Note    Patient Details  Name: Andrew Love MRN: [id number] Date of Birth: [birth year]  Transition of [facility]) CM/SW Contact:    Boneta Lucks, RN Phone Number: 07/26/2022, 12:15 PM  Clinical Narrative:      Patient admitted with Colitis. [place] notification completed - [phone] . GI consulted, TOC following.   Expected Discharge Plan: Home/Self Care Barriers to Discharge: Continued Medical Work up   Patient Goals and CMS Choice Patient states their goals for this hospitalization and ongoing recovery are:: to go home CMS Medicare.gov Compare Post Acute Care list provided to:: Patient      Expected Discharge Plan and Services       Living arrangements for the past 2 months: [place]                   Prior Living Arrangements/Services Living arrangements for the past 2 months: [place] Lives with:: Spouse          Activities of Daily Living Home Assistive Devices/Equipment: None ADL Screening (condition at time of admission) Patient's cognitive ability adequate to safely complete daily activities?: Yes Is the patient deaf or have difficulty hearing?: No Does the patient have difficulty seeing, even when wearing glasses/contacts?: No Does the patient have difficulty concentrating, remembering, or making decisions?: No Patient able to express need for assistance with ADLs?: Yes Does the patient have difficulty dressing or bathing?: No Independently performs ADLs?: Yes (appropriate for developmental age) Does the patient have difficulty walking or climbing stairs?: No Weakness of Legs: Both Weakness of Arms/Hands: Both  Permission Sought/Granted   Admission diagnosis:  Colitis [K52.9] Pancolitis ([place]) [K51.00] Patient Active Problem List   Diagnosis Date Noted   Chest pain 06/02/2021   Obesity (BMI 30.0-34.9) 06/02/2021   Chronic back pain 06/02/2021   Osteoarthritis of left hip  07/22/2019   S/P total hip arthroplasty 07/22/2019   Food intolerance 07/09/2019   Chronic urticaria 07/09/2019   Seasonal and perennial allergic rhinitis 07/09/2019   Elevated liver enzymes    Left sided abdominal pain 04/18/2019   Diarrhea due to COVID-19 03/11/2019   Vomiting 03/10/2019   COVID-19 virus infection 03/10/2019   Hypokalemia 03/10/2019   AKI (acute kidney injury) ([place]) [id number]   Metabolic acidosis [id number]   Lumbar radiculopathy 01/26/2019   LUQ pain 06/19/2016   Symptomatic cholelithiasis 08/30/2015   Preoperative cardiovascular examination    Coronary artery disease involving native coronary artery of native heart without angina pectoris    Gastroenteritis 08/26/2015   Cholelithiases 08/24/2015   Abdominal pain 08/24/2015   Cholecystitis, acute 08/24/2015   Diarrhea 08/24/2015   Acute cholecystitis 08/24/2015   Nausea without vomiting 04/08/2015   Intractable vomiting with nausea 04/15/2014   Diverticulitis of colon    Cerebral thrombosis with cerebral infarction ([place]) 03/21/2014   Stroke ([place]) 03/21/2014   Essential hypertension 03/21/2014   TIA (transient ischemic attack)    HLD (hyperlipidemia)    Hypothyroidism    Colitis 11/20/2012   CAD (coronary artery disease)    Hip pain 08/04/2011   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 [place] [place] [address] Phone: [phone] Fax: [phone]     Social Determinants of Health (SDOH)  Social History: SDOH Screenings   Food Insecurity: No Food Insecurity (07/26/2022)  Housing: Low Risk  (07/26/2022)  Transportation Needs: No Transportation Needs (07/26/2022)  Utilities: Not At Risk (07/26/2022)  Tobacco Use: Medium Risk (07/25/2022)   SDOH Interventions:     Readmission Risk Interventions     No data to display

## 2022-07-26 NOTE — Assessment & Plan Note (Signed)
Continue Synthroid °

## 2022-07-26 NOTE — Consult Note (Addendum)
Gastroenterology Consult   Referring Provider: No ref. provider found Primary Care Physician:  [facility] Primary Gastroenterologist:  Dr. Abbey Chatters   Patient ID: Andrew Love; [id number]; [birth year]   Admit date: 07/25/2022  LOS: 0 days   Date of Consultation: 07/26/2022  Reason for Consultation:  pancolitis   History of Present Illness   Andrew Love is a 57 y.o. year old male with history of Colitis, GERD, HTN, Hypothyroidism, psoriasis, sleep apnea, CAD, recurrent diverticulitis with history of sigmoid colectomy in 2012 who presented to the ED yesterday with epigastric and left sided abdominal pain x4-5 days, fever at home, T max 101, persistent watery diarrhea. Denied rectal bleeding or melena.   ED Course: CT A/P with pancolitis with diarrheal state  Stool studies in process Sodium 131, potassium 3, ca 8.4  Started on cipro and flagyl   Consult: Patient states he developed abdominal pain, n/v/d since Thursday night. No other sick contacts. No antibiotic therapy prior to acute illness. Denies rectal bleeding or melena. He notes similar symptoms when he had his colon resection but no other episodes since then. Diarrhea has slowed down some since admission, up until yesterday reports maybe 20 episodes of stools per day. Only 2 BMs per day that are very watery. Feels that pain and nausea are well managed as long as he is on pain meds and anti emetics, he is requesting dilaudid as it works better for him. He is tolerating clear liquid diet.     Last TCS:10/2021- Non-bleeding internal hemorrhoids.                           - Diverticulosis in the sigmoid colon.                           - Two 5 to 7 mm polyps in the ascending colon and                            in the cecum, removed with a cold snare. Resected                            and retrieved.                           - The examination was otherwise normal. Last EGD: 10/2021   Past Medical History:   Diagnosis Date   Arthritis    CAD (coronary artery disease)    Chronic back pain    Chronic left hip pain    Colitis    Per medical history from dated 06/13/10.   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   Sigmoid colon ulcer    Rectal polyps   Sleep apnea with use of continuous positive airway pressure (CPAP)    Urticaria     Past Surgical History:  Procedure Laterality Date   APPENDECTOMY     8th grade   BACK  SURGERY     BIOPSY  10/17/2021   Procedure: BIOPSY;  Surgeon: Eloise Harman, DO;  [facility];  Service: Endoscopy;;   CARDIAC CATHETERIZATION  2009   CARDIAC CATHETERIZATION     Per medical history from dated 06/13/10.   CERVICAL SPINE SURGERY     C4, C5, C6 spinal fusion   CHOLECYSTECTOMY N/A 08/31/2015   Procedure: LAPAROSCOPIC CHOLECYSTECTOMY WITH INTRAOPERATIVE CHOLANGIOGRAM;  Surgeon: Excell Seltzer, MD;  Location: [facility];  Service: General;  Laterality: N/A;   COLONOSCOPY  07/2008   Colitis,NSAID v. Ischemia,malrotation of the gut,Diverticulosis(L),hyperplastic   COLONOSCOPY N/A 12/13/2012   Dr. Oneida Alar: Normal TI, mild sigmoid diverticulosis, hemorrhoids, 2 polyps (tubular adenoma). Random colon bx negative. Next TCS 12/2022 with Fentanyl/phenergan   COLONOSCOPY WITH PROPOFOL N/A 10/17/2021   Procedure: COLONOSCOPY WITH PROPOFOL;  Surgeon: Eloise Harman, DO;  [facility];  Service: Endoscopy;  Laterality: N/A;  10:15am   ESOPHAGOGASTRODUODENOSCOPY N/A 04/16/2014   RMR: Erosive reflux esophagitis. Non critical Schzki's ring not manipulated. Small hiatal hernia. Abnormal gastirc mucosa of doubtful signigicance status post biopsy. I  suspect trivial upper GI bleed. Recent abdominal pain presumably secondary to a protracted bout of diverticulitis. CT scan November 23 revealed improvemetn without complication. He finished his antibiotics 2 days age. Left sided ab   ESOPHAGOGASTRODUODENOSCOPY (EGD) WITH PROPOFOL N/A 10/17/2021   Procedure: ESOPHAGOGASTRODUODENOSCOPY (EGD) WITH PROPOFOL;  Surgeon: Eloise Harman, DO;  [facility];  Service: Endoscopy;  Laterality: N/A;   JOINT REPLACEMENT     LAPAROSCOPIC SIGMOID COLECTOMY  2012   Dr. Fanny Skates: recurrent sigmoid diverticulitis   LAPAROSCOPIC SIGMOID COLECTOMY  2012   recurernt sigmoid diverticulitis, Dr. Dalbert Batman    LEFT HEART CATH AND CORONARY ANGIOGRAPHY N/A 06/03/2021   Procedure: LEFT HEART CATH AND CORONARY ANGIOGRAPHY;  Surgeon: Early Osmond, MD;  Location: [facility];  Service: Cardiovascular;  Laterality: N/A;   POLYPECTOMY  10/17/2021   Procedure: POLYPECTOMY;  Surgeon: Eloise Harman, DO;  [facility];  Service: Endoscopy;;   ROTATOR CUFF REPAIR     Left - per medical history form dated 06/13/10.   SHOULDER SURGERY     Left   THYROIDECTOMY     Per medical history form dated 06/13/10.   TOTAL HIP ARTHROPLASTY  07/04/2011   Procedure: TOTAL HIP ARTHROPLASTY;  Surgeon: Johnny Bridge, MD;  Location: [facility];  Service: Orthopedics;  Laterality: Right;   TOTAL HIP ARTHROPLASTY Left 07/22/2019   Procedure: TOTAL HIP ARTHROPLASTY;  Surgeon: Marchia Bond, MD;  Location[facility];  Service: Orthopedics;  Laterality: Left;    Prior to Admission medications   Medication Sig Start Date End Date Taking? Authorizing Provider  acetaminophen (TYLENOL) 500 MG tablet Take 500-1,000 mg by mouth every 6 (six) hours as needed for mild pain or fever.    [provider]  amLODipine (NORVASC) 5 MG tablet Take 1 tablet (5 mg total) by mouth daily. 05/22/22 05/17/23  Charlie Pitter, PA-C  aspirin EC 81 MG EC tablet Take 1 tablet (81 mg total) by  mouth daily. Swallow whole. Patient taking differently: Take 81 mg by mouth daily as needed. Swallow whole. 06/05/21   Hosie Poisson, MD  baclofen (LIORESAL) 10 MG tablet Take 1 tablet (10 mg total) by mouth 3 (three) times daily. As needed for muscle spasm Patient not taking: Reported on 05/16/2022 07/23/19   Ventura Bruns, PA-C  cholecalciferol (VITAMIN D3) 25 MCG (1000 UNIT)  tablet Take 1,000 Units by mouth at bedtime.    [provider]  diphenhydrAMINE (BENADRYL) 25 MG tablet Take 25-50 mg by mouth every 6 (six) hours as needed for allergies.    [provider]  EPINEPHrine (EPIPEN) 0.3 mg/0.3 mL SOAJ Inject 0.3 mLs (0.3 mg total) into the muscle as needed. Patient taking differently: Inject 0.3 mg into the muscle as needed for anaphylaxis. 12/01/12   Teressa Lower, MD  ibuprofen (ADVIL) 200 MG tablet Take 800 mg by mouth every 8 (eight) hours as needed for moderate pain.    [provider]  isosorbide mononitrate (IMDUR) 30 MG 24 hr tablet Take 1 tablet (30 mg total) by mouth daily. 10/06/21 10/01/22  Strader, Fransisco Hertz, PA-C  levothyroxine (SYNTHROID, LEVOTHROID) 300 MCG tablet Take 300 mcg by mouth every morning.  03/26/14   [provider]  losartan (COZAAR) 100 MG tablet Take 100 mg by mouth daily. 09/26/20   [provider]  meclizine (ANTIVERT) 25 MG tablet Take 1 tablet (25 mg total) by mouth 3 (three) times daily as needed for dizziness. 04/09/17   Isla Pence, MD  nitroGLYCERIN (NITROSTAT) 0.4 MG SL tablet Place 1 tablet (0.4 mg total) under the tongue every 5 (five) minutes as needed for chest pain. Up to 3 tablets then call 911 or go to the nearest ED. 10/06/21 05/16/22  Ahmed Prima, Fransisco Hertz, PA-C  Omega-3 Fatty Acids (FISH OIL) 1000 MG CAPS Take 1,000 mg by mouth daily.    [provider]  omeprazole (PRILOSEC) 40 MG capsule Take 1 capsule (40 mg total) by mouth 2 (two) times daily. 10/17/21 05/16/22  Eloise Harman, DO  ondansetron  (ZOFRAN) 4 MG tablet Take 1 tablet (4 mg total) by mouth every 6 (six) hours. Patient taking differently: Take 4 mg by mouth every 8 (eight) hours as needed for nausea or vomiting. 01/04/20   Noemi Chapel, MD  oxyCODONE (ROXICODONE) 5 MG immediate release tablet Take 1 tablet (5 mg total) by mouth every 4 (four) hours as needed for severe pain. 12/10/21   Maudie Flakes, MD  promethazine (PHENERGAN) 25 MG tablet Take 1 tablet (25 mg total) by mouth every 6 (six) hours as needed for nausea or vomiting. 04/10/19   Jacqlyn Larsen, PA-C  Tetrahydroz-Dextran-PEG-Povid (VISINE ADVANCED RELIEF) 0.05-0.1-1-1 % SOLN Place 1 drop into both eyes daily as needed (dry eyes).    [provider]  zolpidem (AMBIEN) 10 MG tablet Take 10 mg by mouth at bedtime. Patient not taking: Reported on 05/16/2022    [provider]    Current Facility-Administered Medications  Medication Dose Route Frequency Provider Last Rate Last Admin   0.9 %  sodium chloride infusion   Intravenous Continuous Zierle-Ghosh, Asia B, DO 75 mL/hr at 07/26/22 0150 New Bag at 07/26/22 0150   acetaminophen (TYLENOL) tablet 650 mg  650 mg Oral Q6H PRN Zierle-Ghosh, Asia B, DO       Or   acetaminophen (TYLENOL) suppository 650 mg  650 mg Rectal Q6H PRN Zierle-Ghosh, Asia B, DO       amLODipine (NORVASC) tablet 5 mg  5 mg Oral Daily Zierle-Ghosh, Asia B, DO       ciprofloxacin (CIPRO) IVPB 400 mg  400 mg Intravenous Q12H Zierle-Ghosh, Asia B, DO 200 mL/hr at 07/26/22 1027 400 mg at 07/26/22 1027   diphenhydrAMINE (BENADRYL) capsule 25-50 mg  25-50 mg Oral Q6H PRN Zierle-Ghosh, Asia B, DO       heparin injection 5,000  Units  5,000 Units Subcutaneous Q8H Zierle-Ghosh, Asia B, DO   5,000 Units at 07/26/22 0513   isosorbide mononitrate (IMDUR) 24 hr tablet 30 mg  30 mg Oral Daily Zierle-Ghosh, Asia B, DO       levothyroxine (SYNTHROID) tablet 300 mcg  300 mcg Oral q morning Zierle-Ghosh, Asia B, DO       losartan (COZAAR) tablet 100 mg   100 mg Oral Daily Zierle-Ghosh, Asia B, DO       meclizine (ANTIVERT) tablet 25 mg  25 mg Oral TID PRN Zierle-Ghosh, Asia B, DO       metroNIDAZOLE (FLAGYL) IVPB 500 mg  500 mg Intravenous Q12H Zierle-Ghosh, Asia B, DO 100 mL/hr at 07/26/22 0243 500 mg at 07/26/22 0243   morphine (PF) 4 MG/ML injection 4 mg  4 mg Intravenous Q2H PRN Zierle-Ghosh, Asia B, DO   4 mg at 07/26/22 0514   ondansetron (ZOFRAN) tablet 4 mg  4 mg Oral Q6H PRN Zierle-Ghosh, Asia B, DO       Or   ondansetron (ZOFRAN) injection 4 mg  4 mg Intravenous Q6H PRN Zierle-Ghosh, Asia B, DO   4 mg at 07/26/22 1031   oxyCODONE (Oxy IR/ROXICODONE) immediate release tablet 5 mg  5 mg Oral Q4H PRN Zierle-Ghosh, Asia B, DO   5 mg at 07/26/22 0815   pantoprazole (PROTONIX) injection 40 mg  40 mg Intravenous Q12H Zierle-Ghosh, Asia B, DO   40 mg at 07/26/22 0815    Allergies as of 07/25/2022 - Review Complete 07/25/2022  Allergen Reaction Noted   Dust mite mixed allergen ext [mite (d. farinae)] Anaphylaxis and Hives 08/06/2019   Other Hives and Shortness Of Breath 01/08/2013   Iohexol Hives 03/31/2014   Almond (diagnostic) Hives 08/06/2019   Crestor [rosuvastatin]  10/06/2021   Oysters [shellfish allergy] Hives 06/19/2016   Wheat Hives 06/19/2016   Yeast-related products Hives 07/04/2016    Family History  Problem Relation Age of Onset   Cancer Mother        breast cancer - per medical history form dated 06/13/10.   Diverticulitis Mother    Hypertension Mother    Cancer Father        skin - per medical history form dated 06/13/10.   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   Married   Social Determinants of Health   Financial Resource Strain: Not on file  Food Insecurity: No Food Insecurity (07/26/2022)   Hunger Vital Sign    Worried About Running Out of Food in the Last Year: Never true    Ran Out of Food in the Last Year: Never true  Transportation Needs: No Transportation Needs (07/26/2022)   PRAPARE - Hydrologist (Medical): No    Lack of Transportation (Non-Medical): No  Physical Activity: Not on file  Stress: Not on file  Social Connections: Not on file  Intimate Partner Violence: Not At Risk (07/26/2022)   Humiliation, Afraid, Rape, and Kick questionnaire    Fear of Current or Ex-Partner: No    Emotionally Abused: No    Physically Abused: No    Sexually Abused: No     Review of Systems   Gen: Denies any fever, chills, loss of appetite, change in weight or weight loss CV: Denies chest pain, heart palpitations, syncope, edema  Resp: Denies shortness of breath with rest, cough, wheezing, coughing up blood, and pleurisy. [id number] melena, hematochezia, constipation, dysphagia, odyonophagia, early satiety or weight loss.   +diarrhea +abdominal pain +nausea  GU : Denies urinary burning, blood in urine, urinary frequency, and urinary incontinence. MS: Denies joint pain, limitation of movement, swelling, cramps, and atrophy.  Derm:  Denies rash, itching, dry skin, hives. Psych: Denies depression, anxiety, memory loss, hallucinations, and confusion. Heme: Denies bruising or bleeding Neuro:  Denies any headaches, dizziness, paresthesias, shaking  Physical Exam   Vital Signs in last 24 hours: Temp:  [97.6 F (36.4 C)-98.7 F (37.1 C)] 97.6 F (36.4 C) (03/20 0355) Pulse Rate:  [67-82] 67 (03/20 0355) Resp:  [16-20] 17 (03/20 0355) BP: (115-143)/(66-94) 115/66 (03/20 0355) SpO2:  [95 %-98 %] 96 % (03/20 0355) Weight:  [106.1 kg] 106.1 kg (03/19 1703) Last BM Date : 07/25/22  General:   Alert,  Well-developed, well-nourished, pleasant and cooperative in NAD Head:  Normocephalic and atraumatic. Eyes:  Sclera clear, no icterus.   Conjunctiva pink. Ears:  Normal auditory acuity. Mouth:  No deformity or lesions, dentition normal. Neck:  Supple; no masses Lungs:  Clear throughout to auscultation.   No wheezes, crackles, or rhonchi. No acute distress. Heart:  Regular rate and rhythm; no murmurs, clicks, rubs,  or gallops. Abdomen:  Soft,  and nondistended. TTP of mid to lower left abdomen. No masses, hepatosplenomegaly or hernias noted. Normal bowel sounds, without guarding, and without rebound.   Msk:  Symmetrical without gross deformities. Normal posture. Extremities:  Without clubbing or edema. Neurologic:  Alert and  oriented x4. Skin:  Intact without significant lesions or rashes. Psych:  Alert and cooperative. Normal mood and affect.  Intake/Output from previous day: 03/19 0701 - 03/20 0700 In: 658.3 [I.V.:449.5; IV Piggyback:208.9] Out: -    Labs/Studies   Recent Labs Recent Labs    07/25/22 1834 07/26/22 0426  WBC 9.9 9.0  HGB 15.1 12.8*  HCT 41.8 36.2*  PLT 249 223   BMET Recent Labs    07/25/22 1834 07/26/22 0426  NA 131* 132*  K 3.0* 3.1*  CL 95* 99  CO2 25 24  GLUCOSE 138* 137*  BUN 8 8  CREATININE 1.04 0.98  CALCIUM 8.4* 7.9*   LFT Recent Labs    07/25/22 1833 07/26/22 0426   PROT 7.3 6.1*  ALBUMIN 3.7 3.0*  AST 26 24  ALT 37 30  ALKPHOS 117 98  BILITOT 1.2 1.0  BILIDIR 0.2  --   IBILI 1.0*  --     CT ABDOMEN PELVIS WO CONTRAST  Result Date: 07/25/2022 CLINICAL DATA:  Left lower quadrant abdominal pain. EXAM: CT ABDOMEN AND PELVIS WITHOUT CONTRAST TECHNIQUE: Multidetector CT imaging of the abdomen and pelvis was performed following the standard protocol without IV contrast. RADIATION DOSE REDUCTION: This exam was performed according to the departmental dose-optimization program which includes automated exposure control, adjustment of the mA and/or kV according to patient size and/or use of iterative reconstruction technique. COMPARISON:  CT abdomen pelvis dated 04/10/2019. FINDINGS: Evaluation of this exam is limited in the absence of intravenous contrast. Lower chest: The visualized lung bases are clear. No intra-abdominal free air or free fluid. Hepatobiliary: The liver is unremarkable. No biliary ductal dilatation. Cholecystectomy. No retained calcified stone noted in the central CBD. Pancreas: Unremarkable. No pancreatic ductal dilatation or surrounding inflammatory changes. Spleen: Normal in size without focal abnormality. Adrenals/Urinary Tract: The adrenal glands unremarkable. The kidneys, visualized ureters, and urinary bladder appear unremarkable. Stomach/Bowel: There is sigmoid diverticulosis without active inflammatory changes. 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 The cecum is located in the left lower abdomen. There is mild diffuse thickened appearance of the colon with pericolonic stranding and edema consistent with colitis. Loose stool within the colon consistent with diarrheal state. There is no bowel obstruction. The appendix is not visualized. Vascular/Lymphatic: Mild aortoiliac atherosclerotic disease. The IVC is unremarkable. No portal venous gas. There is no adenopathy. Reproductive: The prostate and seminal vesicles are grossly  unremarkable. No pelvic mass. Other: None Musculoskeletal: Bilateral total hip arthroplasties. No acute osseous pathology. IMPRESSION: 1. Pancolitis with diarrheal state. No bowel obstruction. 2. Sigmoid diverticulosis. 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 4.  Aortic Atherosclerosis (ICD10-I70.0). Electronically Signed   By: Anner Crete M.D.   On: 07/25/2022 20:00     Assessment   Andrew Love is a 57 y.o. year old male with history of Colitis, GERD, HTN, Hypothyroidism, psoriasis, sleep apnea, CAD, recurrent diverticulitis with history of sigmoid colectomy in 2012 who presented to the ED yesterday with epigastric and left sided abdominal pain x4-5 days, fever at home, and watery diarrhea with CT showing pancolitis. GI consulted for further management.  Pancolitis with diarrhea: stool studies pending. Denies recent antibiotic therapies prior to acute illness. Started on cipro and flagyl in ED. He appears to be improving. Diarrhea has decreased. Recent Colonoscopy June 2023 with hemorrhoids, diverticulosis, 2 colon polyps. Will follow stool studies, should continue with cipro and flagyl, pain and nausea management, as well as electrolyte correction per hospitalist.    Plan / Recommendations    continue cipro and flagyl Electrolyte correction per hospitalist Follow for stool study results PPI Daily     07/26/2022, 10:38 AM Chelsea L. Alver Sorrow, MSN, APRN, AGNP-C Adult-Gerontology Nurse Practitioner [facility]   Attending note: Patient seen and examined;database reviewed.  CT images reviewed.  I agree with our assessment and recommendations as outlined above.  Patient most likely has suffered about of foodborne illness.  Anticipate full recovery.  Antibiotics have been initiated.  He is up-to-date on colonoscopy.  Will review stool studies as they become available.

## 2022-07-26 NOTE — Assessment & Plan Note (Signed)
-   Diet controlled at baseline - Currently n.p.o. - When patient is able to resume diet, recommend heart healthy - Continue to monitor

## 2022-07-26 NOTE — Progress Notes (Signed)
Patient admitted to the floor. Placed on telemetry per order. Pt. Complains of a constant pain in the upper left and middle of abdomen of a 7 on a scale of 0-10. Medication given per order.   At 12:54am: Patient states pain is a lot better now. Gives a rating of 3 on a scale of 0-10. Patient states he is going to close his eyes and get some rest.   2:30am: Patient lying in bed with eyes closed resting.   At 5:30am-Pt. Started complaining of pain again in abdomen, same place as previously of a 7 on a pain scale of 0-10. PRN meds given.   Patient states that his pain has decreased a lot. Pt. Is lying in bed with eyes closed resting.

## 2022-07-26 NOTE — Assessment & Plan Note (Signed)
-   Pancolitis on CT abdomen pelvis - Cipro started in the ED, continue Cipro - Add on Flagyl - Stool pathogens pending - No recent antibiotic use - History of partial bowel resection secondary to diverticulitis - Pain control with pain scale - Continue IV fluids - N.p.o. except for sips and chips - Continue to monitor

## 2022-07-26 NOTE — Hospital Course (Signed)
57 year old male with a history of diverticulitis status post sigmoid colectomy 2012, hypertension, hyperlipidemia, coronary disease presenting with 1 week history of diarrhea, nausea, vomiting, and abdominal pain.  The patient began developing fevers up to 101.7 F on 07/21/2022.  Since then, his fevers are trending down with a Tmax up to 100.0.  He denies any hematochezia or melena.  There is no hematemesis.  He denies any recent travels.  He denies any recent antibiotics.  He denies any sick contacts.  The patient states that he ate a hotdog at a local establishment prior to development of his symptoms.  He denies any new medications. In the ED, the patient was afebrile and hemodynamically stable.  WBC 9.9, hemoglobin 10.1, platelets 223,000.  CT of the abdomen and pelvis showed pancolitis with loose stool.  There is sigmoid diverticulosis.  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  C. difficile and stool pathogen panel were ordered.

## 2022-07-27 DIAGNOSIS — A045 Campylobacter enteritis: Secondary | ICD-10-CM | POA: Insufficient documentation

## 2022-07-27 DIAGNOSIS — R112 Nausea with vomiting, unspecified: Secondary | ICD-10-CM | POA: Diagnosis not present

## 2022-07-27 DIAGNOSIS — K5100 Ulcerative (chronic) pancolitis without complications: Principal | ICD-10-CM

## 2022-07-27 DIAGNOSIS — K529 Noninfective gastroenteritis and colitis, unspecified: Secondary | ICD-10-CM | POA: Diagnosis not present

## 2022-07-27 DIAGNOSIS — I10 Essential (primary) hypertension: Secondary | ICD-10-CM | POA: Diagnosis not present

## 2022-07-27 LAB — CBC
HCT: 35.8 % — ABNORMAL LOW (ref 39.0–52.0)
Hemoglobin: 12.7 g/dL — ABNORMAL LOW (ref 13.0–17.0)
MCH: 31.2 pg (ref 26.0–34.0)
MCHC: 35.5 g/dL (ref 30.0–36.0)
MCV: 88 fL (ref 80.0–100.0)
Platelets: 220 10*3/uL (ref 150–400)
RBC: 4.07 MIL/uL — ABNORMAL LOW (ref 4.22–5.81)
RDW: 12.2 % (ref 11.5–15.5)
WBC: 7.7 10*3/uL (ref 4.0–10.5)
nRBC: 0 % (ref 0.0–0.2)

## 2022-07-27 LAB — HEMOGLOBIN A1C
Hgb A1c MFr Bld: 7.6 % — ABNORMAL HIGH (ref 4.8–5.6)
Mean Plasma Glucose: 171 mg/dL

## 2022-07-27 LAB — GASTROINTESTINAL PANEL BY PCR, STOOL (REPLACES STOOL CULTURE)

## 2022-07-27 LAB — BASIC METABOLIC PANEL
Anion gap: 9 (ref 5–15)
BUN: 6 mg/dL (ref 6–20)
CO2: 25 mmol/L (ref 22–32)
Calcium: 8.1 mg/dL — ABNORMAL LOW (ref 8.9–10.3)
Chloride: 101 mmol/L (ref 98–111)
Creatinine, Ser: 1.1 mg/dL (ref 0.61–1.24)
GFR, Estimated: >60 mL/min (ref >60)
Glucose, Bld: 126 mg/dL — ABNORMAL HIGH (ref 70–99)
Potassium: 3.5 mmol/L (ref 3.5–5.1)
Sodium: 135 mmol/L (ref 135–145)

## 2022-07-27 LAB — MAGNESIUM: Magnesium: 2 mg/dL (ref 1.7–2.4)

## 2022-07-27 MED ORDER — GUAIFENESIN-DM 100-10 MG/5ML PO SYRP
10.0000 mL | ORAL_SOLUTION | ORAL | Status: DC | PRN
  Administered 2022-07-27 – 2022-07-28 (×2): 10 mL via ORAL
  Filled 2022-07-27 (×2): qty 10

## 2022-07-27 MED ORDER — POTASSIUM CHLORIDE IN NACL 40-0.9 MEQ/L-% IV SOLN: INTRAVENOUS | Status: AC

## 2022-07-27 MED ORDER — GUAIFENESIN-DM 100-10 MG/5ML PO SYRP: 5.0000 mL | ORAL_SOLUTION | ORAL | Status: DC | PRN

## 2022-07-27 MED ORDER — AZITHROMYCIN 250 MG PO TABS
500.0000 mg | ORAL_TABLET | Freq: Every day | ORAL | Status: DC
  Administered 2022-07-27 – 2022-07-29 (×3): 500 mg via ORAL
  Filled 2022-07-27 (×3): qty 2

## 2022-07-27 NOTE — Progress Notes (Signed)
Gastroenterology Progress Note   Referring Provider: No ref. provider found Primary Care Physician:  [facility] Primary Gastroenterologist:  Elon Alas. Abbey Chatters, DO   Patient ID: Andrew Love; EO:[id number]; 1965/08/06   Subjective:    Still having epigastric/LUQ pain. Diarrhea may be little less frequent, stools remain very loose. No melena, brbpr. Nausea after broth this morning.   Objective:   Vital signs in last 24 hours: Temp:  [97.5 F (36.4 C)-98.2 F (36.8 C)] 98 F (36.7 C) (03/21 0641) Pulse Rate:  [57-67] 67 (03/21 0641) Resp:  [17-18] 18 (03/21 0641) BP: (114-124)/(71-78) 124/76 (03/21 0641) SpO2:  [95 %-97 %] 96 % (03/21 0641) Last BM Date : 07/25/22 General:   Alert,  Well-developed, well-nourished, pleasant and cooperative in NAD Head:  Normocephalic and atraumatic. Eyes:  Sclera clear, no icterus.   Abdomen:  Soft,  nondistended.  Moderate tenderness epig/left abd. Normal bowel sounds, without guarding, and without rebound.   Extremities:  Without clubbing, deformity or edema. Neurologic:  Alert and  oriented x4;  grossly normal neurologically. Skin:  Intact without significant lesions or rashes. Psych:  Alert and cooperative. Normal mood and affect.  Intake/Output from previous day: 03/20 0701 - 03/21 0700 In: 3315.8 [P.O.:720; I.V.:982.8; IV Piggyback:1613] Out: 450 [Urine:450] Intake/Output this shift: No intake/output data recorded.  Lab Results: CBC Recent Labs    07/25/22 1834 07/26/22 0426 07/27/22 0402  WBC 9.9 9.0 7.7  HGB 15.1 12.8* 12.7*  HCT 41.8 36.2* 35.8*  MCV 86.7 87.9 88.0  PLT 249 223 220   BMET Recent Labs    07/25/22 1834 07/26/22 0426 07/27/22 0402  NA 131* 132* 135  K 3.0* 3.1* 3.5  CL 95* 99 101  CO2 25 24 25   GLUCOSE 138* 137* 126*  BUN 8 8 6   CREATININE 1.04 0.98 1.10  CALCIUM 8.4* 7.9* 8.1*   LFTs Recent Labs    07/25/22 1833 07/26/22 0426  BILITOT 1.2 1.0  BILIDIR 0.2  --   IBILI 1.0*   --   ALKPHOS 117 98  AST 26 24  ALT 37 30  PROT 7.3 6.1*  ALBUMIN 3.7 3.0*   Recent Labs    07/25/22 1833  LIPASE 21   PT/INR No results for input(s): "LABPROT", "INR" in the last 72 hours.       Imaging Studies: CT ABDOMEN PELVIS WO CONTRAST  Result Date: 07/25/2022 CLINICAL DATA:  Left lower quadrant abdominal pain. EXAM: CT ABDOMEN AND PELVIS WITHOUT CONTRAST TECHNIQUE: Multidetector CT imaging of the abdomen and pelvis was performed following the standard protocol without IV contrast. RADIATION DOSE REDUCTION: This exam was performed according to the departmental dose-optimization program which includes automated exposure control, adjustment of the mA and/or kV according to patient size and/or use of iterative reconstruction technique. COMPARISON:  CT abdomen pelvis dated 04/10/2019. FINDINGS: Evaluation of this exam is limited in the absence of intravenous contrast. Lower chest: The visualized lung bases are clear. No intra-abdominal free air or free fluid. Hepatobiliary: The liver is unremarkable. No biliary ductal dilatation. Cholecystectomy. No retained calcified stone noted in the central CBD. Pancreas: Unremarkable. No pancreatic ductal dilatation or surrounding inflammatory changes. Spleen: Normal in size without focal abnormality. Adrenals/Urinary Tract: The adrenal glands unremarkable. The kidneys, visualized ureters, and urinary bladder appear unremarkable. Stomach/Bowel: There is sigmoid diverticulosis without active inflammatory changes. 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 The cecum is located in the left lower abdomen. There is mild diffuse thickened appearance of the colon  with pericolonic stranding and edema consistent with colitis. Loose stool within the colon consistent with diarrheal state. There is no bowel obstruction. The appendix is not visualized. Vascular/Lymphatic: Mild aortoiliac atherosclerotic disease. The IVC is unremarkable. No portal venous gas.  There is no adenopathy. Reproductive: The prostate and seminal vesicles are grossly unremarkable. No pelvic mass. Other: None Musculoskeletal: Bilateral total hip arthroplasties. No acute osseous pathology. IMPRESSION: 1. Pancolitis with diarrheal state. No bowel obstruction. 2. Sigmoid diverticulosis. 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 4.  Aortic Atherosclerosis (ICD10-I70.0). Electronically Signed   By: Anner Crete M.D.   On: 07/25/2022 20:00  [2 weeks]  Assessment:   Andrew Love is a 57 y.o. year old male with history of colitis, GERD, HTN, Hypothyroidism, psoriasis, sleep apnea, CAD, recurrent diverticulitis with history of sigmoid colectomy in 2012 who presented to the ED yesterday with epigastric and left sided abdominal pain x 4-5 days, fever at home, and watery diarrhea with CT showing pancolitis. GI consulted for further management.   Pancolitis with diarrhea: cdiff negative. GI path panel pending. Denies recent antibiotic therapies prior to acute illness. Started on cipro and flagyl in ED. He appears to be improving. Diarrhea has decreased. Recent Colonoscopy June 2023 with hemorrhoids, diverticulosis, 2 colon polyps. Will follow stool studies, should continue with cipro and flagyl, pain and nausea management, as well as electrolyte correction per hospitalist.   History of elevated LFTs/ferritin: Going back about 3 years.  Liver unremarkable on current CT. Also with mildly elevated iron sats. ANA neg.     Plan:   Recommend outpatient work up of elevated LFTs/ferritin/iron sat. Patient aware.  Continue cipro/flagyl. Follow up pending Gi path panel. Continue clear liquids for now, due to ongoing nausea.    LOS: 1 day   Laureen Ochs. Bernarda Caffey [facility] [phone] 3/21/20248:26 AM

## 2022-07-27 NOTE — Progress Notes (Signed)
PROGRESS NOTE  Andrew Love [id number] DOB: November 27, 1965 DOA: 07/25/2022 PCP: [facility]  Brief History:  57 year old male with a history of diverticulitis status post sigmoid colectomy 2012, hypertension, hyperlipidemia, coronary disease presenting with 1 week history of diarrhea, nausea, vomiting, and abdominal pain.  The patient began developing fevers up to 101.7 F on 07/21/2022.  Since then, his fevers are trending down with a Tmax up to 100.0.  He denies any hematochezia or melena.  There is no hematemesis.  He denies any recent travels.  He denies any recent antibiotics.  He denies any sick contacts.  The patient states that he ate a hotdog at a local establishment prior to development of his symptoms.  He denies any new medications. In the ED, the patient was afebrile and hemodynamically stable.  WBC 9.9, hemoglobin 10.1, platelets 223,000.  CT of the abdomen and pelvis showed pancolitis with loose stool.  There is sigmoid diverticulosis.  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  C. difficile and stool pathogen panel were ordered.   Assessment/Plan: Colitis--Campylobacter -stool for cdiff--neg -stool pathogen panel--POS Campylobacter -check COVID--neg -3/19 CT abd as discussed above -initially empiric cipro and flagyl>>switch to azithro -slowly improving   Intractable vomiting -likely gastroenteritis -pantoprazole bid -clear liquids started initially -advance to full liquids   Hypokalemia -add KCl to IVF -check mag--2.0   Hyponatremia -due to volume depletion and poor solute intake   coronary artery disease -no chest pain -continue imdur   Essential HTN -continue amlodipine -holding losartan   Hypothyroidism -continue synthroid                 Family Communication: no  Family at bedside   Consultants:  GI   Code Status:  FULL    DVT Prophylaxis:  [place] Heparin      Procedures: As Listed in Progress Note  Above   Antibiotics: Cipro 3/19>>3/21 Flagyl 3/19>>3/21 Azithro 3/21>>               Subjective: Patient states diarrhea is starting to slow down.  Still has abd pain but slowly improving.  Denies n/v/ hematochezia, cp, sob  Objective: Vitals:   07/26/22 1344 07/26/22 2117 07/27/22 0641 07/27/22 1512  BP: 114/71 121/78 124/76 117/69  Pulse: (!) 57 65 67 64  Resp: 17 18 18 10   Temp: (!) 97.5 F (36.4 C) 98.2 F (36.8 C) 98 F (36.7 C) (!) 97.5 F (36.4 C)  TempSrc: Oral  Oral Oral  SpO2: 95% 97% 96% 95%  Weight:      Height:        Intake/Output Summary (Last 24 hours) at 07/27/2022 1643 Last data filed at 07/27/2022 1300 Gross per 24 hour  Intake 2233.45 ml  Output --  Net 2233.45 ml   Weight change:  Exam:  General:  Pt is alert, follows commands appropriately, not in acute distress HEENT: No icterus, No thrush, No neck mass, [place]/AT Cardiovascular: RRR, S1/S2, no rubs, no gallops Respiratory: CTA bilaterally, no wheezing, no crackles, no rhonchi Abdomen: Soft/+BS, upper tender, non distended, no guarding Extremities: No edema, No lymphangitis, No petechiae, No rashes, no synovitis   Data Reviewed: I have personally reviewed following labs and imaging studies Basic Metabolic Panel: Recent Labs  Lab 07/25/22 1834 07/26/22 0426 07/27/22 0402  NA 131* 132* 135  K 3.0* 3.1* 3.5  CL 95* 99 101  CO2 25 24 25   GLUCOSE 138* 137* 126*  BUN 8  8 6  CREATININE 1.04 0.98 1.10  CALCIUM 8.4* 7.9* 8.1*  MG  --  1.9 2.0   Liver Function Tests: Recent Labs  Lab 07/25/22 1833 07/26/22 0426  AST 26 24  ALT 37 30  ALKPHOS 117 98  BILITOT 1.2 1.0  PROT 7.3 6.1*  ALBUMIN 3.7 3.0*   Recent Labs  Lab 07/25/22 1833  LIPASE 21   No results for input(s): "AMMONIA" in the last 168 hours. Coagulation Profile: No results for input(s): "INR", "PROTIME" in the last 168 hours. CBC: Recent Labs  Lab 07/25/22 1834 07/26/22 0426 07/27/22 0402  WBC 9.9 9.0  7.7  NEUTROABS  --  5.6  --   HGB 15.1 12.8* 12.7*  HCT 41.8 36.2* 35.8*  MCV 86.7 87.9 88.0  PLT 249 223 220   Cardiac Enzymes: No results for input(s): "CKTOTAL", "CKMB", "CKMBINDEX", "TROPONINI" in the last 168 hours. BNP: Invalid input(s): "POCBNP" CBG: No results for input(s): "GLUCAP" in the last 168 hours. HbA1C: No results for input(s): "HGBA1C" in the last 72 hours. Urine analysis:    Component Value Date/Time   COLORURINE YELLOW 07/25/2022 1919   APPEARANCEUR CLEAR 07/25/2022 1919   LABSPEC 1.015 07/25/2022 [place] 6.0 07/25/2022 [place] 07/25/2022 1919   HGBUR NEGATIVE 07/25/2022 [place] NEGATIVE 07/25/2022 [place] NEGATIVE 07/25/2022 1919   PROTEINUR NEGATIVE 07/25/2022 1919   UROBILINOGEN 0.2 03/31/2014 0150   NITRITE NEGATIVE 07/25/2022 1919   LEUKOCYTESUR NEGATIVE 07/25/2022 1919   Sepsis Labs: @LABRCNTIP (procalcitonin:4,lacticidven:4) ) Recent Results (from the past 240 hour(s))  Gastrointestinal Panel by PCR , Stool     Status: Abnormal   Collection Time: 07/26/22 10:50 AM   Specimen: STOOL  Result Value Ref Range Status   Campylobacter species DETECTED (A) NOT DETECTED Final    Comment: RESULT CALLED TO, READ BACK BY AND VERIFIED WITH: [place] [id number] 07/27/22 HNM    Plesimonas shigelloides NOT DETECTED NOT DETECTED Final   Salmonella species NOT DETECTED NOT DETECTED Final   Yersinia enterocolitica NOT DETECTED NOT DETECTED Final   Vibrio species NOT DETECTED NOT DETECTED Final   Vibrio cholerae NOT DETECTED NOT DETECTED Final   Enteroaggregative E coli (EAEC) NOT DETECTED NOT DETECTED Final   Enteropathogenic E coli (EPEC) NOT DETECTED NOT DETECTED Final   Enterotoxigenic E coli (ETEC) NOT DETECTED NOT DETECTED Final   Shiga like toxin producing E coli (STEC) NOT DETECTED NOT DETECTED Final   Shigella/Enteroinvasive E coli (EIEC) NOT DETECTED NOT DETECTED Final   Cryptosporidium NOT DETECTED  NOT DETECTED Final   Cyclospora cayetanensis NOT DETECTED NOT DETECTED Final   Entamoeba histolytica NOT DETECTED NOT DETECTED Final   Giardia lamblia NOT DETECTED NOT DETECTED Final   Adenovirus F40/41 NOT DETECTED NOT DETECTED Final   Astrovirus NOT DETECTED NOT DETECTED Final   Norovirus GI/GII NOT DETECTED NOT DETECTED Final   Rotavirus A NOT DETECTED NOT DETECTED Final   Sapovirus (I, II, IV, and V) NOT DETECTED NOT DETECTED Final    Comment: Performed at [facility], [address] [place], [place] [address]  C Difficile Quick Screen w PCR reflex     Status: None   Collection Time: 07/26/22 10:50 AM   Specimen: STOOL  Result Value Ref Range Status   C Diff antigen NEGATIVE NEGATIVE Final   C Diff toxin NEGATIVE NEGATIVE Final   C Diff interpretation No C. difficile detected.  Final    Comment: Performed at  [address] [place], [place] [address]  Resp panel by RT-PCR (RSV, Flu A&B, Covid) Anterior Nasal Swab     Status: None   Collection Time: 07/26/22 10:51 AM   Specimen: Anterior Nasal Swab  Result Value Ref Range Status   SARS Coronavirus 2 by RT PCR NEGATIVE NEGATIVE Final    Comment: (NOTE) SARS-CoV-2 target nucleic acids are NOT DETECTED.  The SARS-CoV-2 RNA is generally detectable in upper respiratory specimens during the acute phase of infection. The lowest concentration of SARS-CoV-2 viral copies this assay can detect is 138 copies/mL. A negative result does not preclude SARS-Cov-2 infection and should not be used as the sole basis for treatment or other patient management decisions. A negative result may occur with  improper specimen collection/handling, submission of specimen other than nasopharyngeal swab, presence of viral mutation(s) within the areas targeted by this assay, and inadequate number of viral copies(<138 copies/mL). A negative result must be combined with clinical observations, patient history, and  epidemiological information. The expected result is Negative.  Fact Sheet for Patients:  EntrepreneurPulse.com.au  Fact Sheet for Healthcare Providers:  IncredibleEmployment.be  This test is no t yet approved or cleared by the [place] FDA and  has been authorized for detection and/or diagnosis of SARS-CoV-2 by FDA under an Emergency Use Authorization (EUA). This EUA will remain  in effect (meaning this test can be used) for the duration of the COVID-19 declaration under Section 564(b)(1) of the Act, 21 U.S.C.section 360bbb-3(b)(1), unless the authorization is terminated  or revoked sooner.       Influenza A by PCR NEGATIVE NEGATIVE Final   Influenza B by PCR NEGATIVE NEGATIVE Final    Comment: (NOTE) The Xpert Xpress SARS-CoV-2/FLU/RSV plus assay is intended as an aid in the diagnosis of influenza from Nasopharyngeal swab specimens and should not be used as a sole basis for treatment. Nasal washings and aspirates are unacceptable for Xpert Xpress SARS-CoV-2/FLU/RSV testing.  Fact Sheet for Patients: EntrepreneurPulse.com.au  Fact Sheet for Healthcare Providers: IncredibleEmployment.be  This test is not yet approved or cleared by the [place] FDA and has been authorized for detection and/or diagnosis of SARS-CoV-2 by FDA under an Emergency Use Authorization (EUA). This EUA will remain in effect (meaning this test can be used) for the duration of the COVID-19 declaration under Section 564(b)(1) of the Act, 21 U.S.C. section 360bbb-3(b)(1), unless the authorization is terminated or revoked.     Resp Syncytial Virus by PCR NEGATIVE NEGATIVE Final    Comment: (NOTE) Fact Sheet for Patients: EntrepreneurPulse.com.au  Fact Sheet for Healthcare Providers: IncredibleEmployment.be  This test is not yet approved or cleared by the [place] FDA and has been  authorized for detection and/or diagnosis of SARS-CoV-2 by FDA under an Emergency Use Authorization (EUA). This EUA will remain in effect (meaning this test can be used) for the duration of the COVID-19 declaration under Section 564(b)(1) of the Act, 21 U.S.C. section 360bbb-3(b)(1), unless the authorization is terminated or revoked.  Performed at [facility] [place], [address] [place], [place] [address]      Scheduled Meds:  amLODipine  5 mg Oral Daily   azithromycin  500 mg Oral Daily   heparin  5,000 Units Subcutaneous Q8H   isosorbide mononitrate  30 mg Oral Daily   levothyroxine  300 mcg Oral QAC breakfast   pantoprazole (PROTONIX) IV  40 mg Intravenous Q12H   Continuous Infusions:  Procedures/Studies: CT ABDOMEN PELVIS WO CONTRAST  Result Date: 07/25/2022 CLINICAL  DATA:  Left lower quadrant abdominal pain. EXAM: CT ABDOMEN AND PELVIS WITHOUT CONTRAST TECHNIQUE: Multidetector CT imaging of the abdomen and pelvis was performed following the standard protocol without IV contrast. RADIATION DOSE REDUCTION: This exam was performed according to the departmental dose-optimization program which includes automated exposure control, adjustment of the mA and/or kV according to patient size and/or use of iterative reconstruction technique. COMPARISON:  CT abdomen pelvis dated 04/10/2019. FINDINGS: Evaluation of this exam is limited in the absence of intravenous contrast. Lower chest: The visualized lung bases are clear. No intra-abdominal free air or free fluid. Hepatobiliary: The liver is unremarkable. No biliary ductal dilatation. Cholecystectomy. No retained calcified stone noted in the central CBD. Pancreas: Unremarkable. No pancreatic ductal dilatation or surrounding inflammatory changes. Spleen: Normal in size without focal abnormality. Adrenals/Urinary Tract: The adrenal glands unremarkable. The kidneys, visualized ureters, and urinary bladder appear unremarkable. Stomach/Bowel: There is  sigmoid diverticulosis without active inflammatory changes. 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 The cecum is located in the left lower abdomen. There is mild diffuse thickened appearance of the colon with pericolonic stranding and edema consistent with colitis. Loose stool within the colon consistent with diarrheal state. There is no bowel obstruction. The appendix is not visualized. Vascular/Lymphatic: Mild aortoiliac atherosclerotic disease. The IVC is unremarkable. No portal venous gas. There is no adenopathy. Reproductive: The prostate and seminal vesicles are grossly unremarkable. No pelvic mass. Other: None Musculoskeletal: Bilateral total hip arthroplasties. No acute osseous pathology. IMPRESSION: 1. Pancolitis with diarrheal state. No bowel obstruction. 2. Sigmoid diverticulosis. 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 4.  Aortic Atherosclerosis (ICD10-I70.0). Electronically Signed   By: Anner Crete M.D.   On: 07/25/2022 20:00    Orson Eva, DO  [facility]  If 7PM-7AM, please contact night-coverage [email or web address] Password [facility] 07/27/2022, 4:43 PM   LOS: 1 day

## 2022-07-27 NOTE — Progress Notes (Signed)
GI pathogen panel positive for campylobacter.   Will start azithromycin 500mg  daily, would complete 7 days of treatment given pancolitis.  Will stop metronidazole.  Consider continuing IV Cipro until tomorrow.   Laureen Ochs. Bernarda Caffey [facility] [phone] 3/21/20241:38 PM

## 2022-07-28 DIAGNOSIS — R112 Nausea with vomiting, unspecified: Secondary | ICD-10-CM | POA: Diagnosis not present

## 2022-07-28 DIAGNOSIS — K529 Noninfective gastroenteritis and colitis, unspecified: Secondary | ICD-10-CM | POA: Diagnosis not present

## 2022-07-28 DIAGNOSIS — K5100 Ulcerative (chronic) pancolitis without complications: Secondary | ICD-10-CM

## 2022-07-28 DIAGNOSIS — A045 Campylobacter enteritis: Secondary | ICD-10-CM | POA: Diagnosis not present

## 2022-07-28 DIAGNOSIS — R110 Nausea: Secondary | ICD-10-CM

## 2022-07-28 LAB — BASIC METABOLIC PANEL
Anion gap: 9 (ref 5–15)
BUN: 6 mg/dL (ref 6–20)
CO2: 24 mmol/L (ref 22–32)
Calcium: 8.1 mg/dL — ABNORMAL LOW (ref 8.9–10.3)
Chloride: 102 mmol/L (ref 98–111)
Creatinine, Ser: 1.09 mg/dL (ref 0.61–1.24)
GFR, Estimated: >60 mL/min (ref >60)
Glucose, Bld: 120 mg/dL — ABNORMAL HIGH (ref 70–99)
Potassium: 3.7 mmol/L (ref 3.5–5.1)
Sodium: 135 mmol/L (ref 135–145)

## 2022-07-28 LAB — MAGNESIUM: Magnesium: 2 mg/dL (ref 1.7–2.4)

## 2022-07-28 MED ORDER — POTASSIUM CHLORIDE IN NACL 40-0.9 MEQ/L-% IV SOLN: INTRAVENOUS | Status: DC

## 2022-07-28 MED ORDER — ONDANSETRON HCL 4 MG/2ML IJ SOLN
4.0000 mg | Freq: Four times a day (QID) | INTRAMUSCULAR | Status: DC
  Administered 2022-07-28 – 2022-07-29 (×4): 4 mg via INTRAVENOUS
  Filled 2022-07-28 (×4): qty 2

## 2022-07-28 MED ORDER — METOCLOPRAMIDE HCL 5 MG/ML IJ SOLN
5.0000 mg | Freq: Four times a day (QID) | INTRAMUSCULAR | Status: DC
  Administered 2022-07-28 – 2022-07-29 (×4): 5 mg via INTRAVENOUS
  Filled 2022-07-28 (×4): qty 2

## 2022-07-28 NOTE — Progress Notes (Signed)
Patient vomited this shift and continues to have nausea. Zofran IV given twice this shift with relief. Tylenol and Dilaudid given for abdominal pain and a headache. On reassessment patient stated he had relief. Patient slept on and off this shift, only waking for vitals, and to let staff know when he was in pain or nauseated. Continued monitor.

## 2022-07-28 NOTE — Progress Notes (Signed)
Gastroenterology Progress Note   Referring Provider: No ref. provider found Primary Care Physician:  [facility] Primary Gastroenterologist:  Elon Alas. Abbey Chatters, DO  Patient ID: Andrew Love; EO:[id number]; 1966-02-28    Subjective   Patient improving.  Still with constant nausea but no vomiting since this morning.  Still having diarrhea, has had about 6 episodes today.  Was previously having about 20/day.  Still with some upper abdominal pain primarily located to the left side.  Overall improving.  Currently awaiting Zofran prior to attempting to have chocolate ice cream and chocolate pudding as a part of his full liquid diet tray.   Objective   Vital signs in last 24 hours Temp:  [97.5 F (36.4 C)-98.7 F (37.1 C)] 98.7 F (37.1 C) (03/22 1207) Pulse Rate:  [75-87] 87 (03/22 1207) Resp:  [18-20] 18 (03/22 1207) BP: (108-118)/(67-88) 108/88 (03/22 1207) SpO2:  [92 %-96 %] 92 % (03/22 1207) Last BM Date : 07/27/22  Physical Exam General:   Alert and oriented, pleasant Head:  Normocephalic and atraumatic. Eyes:  No icterus, sclera clear. Conjuctiva pink.  Mouth:  Without lesions, mucosa pink and moist.  Neck:  Supple, without thyromegaly or masses.  Abdomen:  Bowel sounds present, soft, non-distended. Ttp to LUQ. No HSM or hernias noted. No rebound or guarding. No masses appreciated  Neurologic:  Alert and  oriented x4;  grossly normal neurologically. Psych:  Alert and cooperative. Normal mood and affect.  Intake/Output from previous day: 03/21 0701 - 03/22 0700 In: 1639.1 [P.O.:680; I.V.:759.1; IV Piggyback:200] Out: -  Intake/Output this shift: Total I/O In: 480 [P.O.:480] Out: -   Lab Results  Recent Labs    07/25/22 1834 07/26/22 0426 07/27/22 0402  WBC 9.9 9.0 7.7  HGB 15.1 12.8* 12.7*  HCT 41.8 36.2* 35.8*  PLT 249 223 220   BMET Recent Labs    07/26/22 0426 07/27/22 0402 07/28/22 0441  NA 132* 135 135  K 3.1* 3.5 3.7  CL 99 101  102  CO2 24 25 24   GLUCOSE 137* 126* 120*  BUN 8 6 6   CREATININE 0.98 1.10 1.09  CALCIUM 7.9* 8.1* 8.1*   LFT Recent Labs    07/25/22 1833 07/26/22 0426  PROT 7.3 6.1*  ALBUMIN 3.7 3.0*  AST 26 24  ALT 37 30  ALKPHOS 117 98  BILITOT 1.2 1.0  BILIDIR 0.2  --   IBILI 1.0*  --    PT/INR No results for input(s): "LABPROT", "INR" in the last 72 hours. Hepatitis Panel No results for input(s): "HEPBSAG", "HCVAB", "HEPAIGM", "HEPBIGM" in the last 72 hours. C-Diff PCR negative  Studies/Results CT ABDOMEN PELVIS WO CONTRAST  Result Date: 07/25/2022 CLINICAL DATA:  Left lower quadrant abdominal pain. EXAM: CT ABDOMEN AND PELVIS WITHOUT CONTRAST TECHNIQUE: Multidetector CT imaging of the abdomen and pelvis was performed following the standard protocol without IV contrast. RADIATION DOSE REDUCTION: This exam was performed according to the departmental dose-optimization program which includes automated exposure control, adjustment of the mA and/or kV according to patient size and/or use of iterative reconstruction technique. COMPARISON:  CT abdomen pelvis dated 04/10/2019. FINDINGS: Evaluation of this exam is limited in the absence of intravenous contrast. Lower chest: The visualized lung bases are clear. No intra-abdominal free air or free fluid. Hepatobiliary: The liver is unremarkable. No biliary ductal dilatation. Cholecystectomy. No retained calcified stone noted in the central CBD. Pancreas: Unremarkable. No pancreatic ductal dilatation or surrounding inflammatory changes. Spleen: Normal in size without focal abnormality. Adrenals/Urinary  Tract: The adrenal glands unremarkable. The kidneys, visualized ureters, and urinary bladder appear unremarkable. Stomach/Bowel: There is sigmoid diverticulosis without active inflammatory changes. 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 The cecum is located in the left lower abdomen. There is mild diffuse thickened appearance of the colon with  pericolonic stranding and edema consistent with colitis. Loose stool within the colon consistent with diarrheal state. There is no bowel obstruction. The appendix is not visualized. Vascular/Lymphatic: Mild aortoiliac atherosclerotic disease. The IVC is unremarkable. No portal venous gas. There is no adenopathy. Reproductive: The prostate and seminal vesicles are grossly unremarkable. No pelvic mass. Other: None Musculoskeletal: Bilateral total hip arthroplasties. No acute osseous pathology. IMPRESSION: 1. Pancolitis with diarrheal state. No bowel obstruction. 2. Sigmoid diverticulosis. 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 4.  Aortic Atherosclerosis (ICD10-I70.0). Electronically Signed   By: Anner Crete M.D.   On: 07/25/2022 20:00    Assessment  57 y.o. male with a history of colitis, GERD, HTN, hypothyroidism, sleep apnea, psoriasis, CAD, recurrent diverticulitis with history of sigmoid colectomy in 2012 who presented to the ED with epigastric pain and left-sided abdominal pain for 4-5 days, fever, and watery diarrhea with CT evidence of pancolitis.  GI consulted for further management.  Pancolitis with diarrhea: C. difficile negative.  GI pathogen panel positive for Campylobacter.  Started empirically on Cipro/Flagyl.  Cipro stopped today.  Started on azithromycin 500 mg daily yesterday.  Previously having about 20 loose stools per day.  Continuing to have diarrhea but frequency has improved to about 6 episodes per day.  Still having nausea but no vomiting since this morning.  Tolerating full liquids with Zofran prior.  Colonoscopy in June 2023 with hemorrhoids, diverticulosis, and 2 polyps.  Will complete 7-day course of azithromycin.  History of elevated LFTs/ferritin: Elevation dating back 3 years.  Liver unremarkable on recent CT.  Mildly elevated iron saturation.  ANA negative.  Patient aware he will have outpatient workup of elevated LFTs and ferritin.  Plan / Recommendations   Continue azithromycin 500 mg daily for total of 7 days Continue with full liquid diet, advance as tolerated Stop Cipro Outpatient workup for elevated LFTs/ferritin and iron saturation.    LOS: 2 days    07/28/2022, 5:17 PM   Venetia Night, MSN, FNP-BC, AGACNP-BC [facility]

## 2022-07-28 NOTE — Progress Notes (Signed)
PROGRESS NOTE  Andrew Love [id number] DOB: [birth year] DOA: 07/25/2022 PCP: [facility]  Brief History:  57 year old male with a history of diverticulitis status post sigmoid colectomy 2012, hypertension, hyperlipidemia, coronary disease presenting with 1 week history of diarrhea, nausea, vomiting, and abdominal pain.  The patient began developing fevers up to 101.7 F on 07/21/2022.  Since then, his fevers are trending down with a Tmax up to 100.0.  He denies any hematochezia or melena.  There is no hematemesis.  He denies any recent travels.  He denies any recent antibiotics.  He denies any sick contacts.  The patient states that he ate a hotdog at a local establishment prior to development of his symptoms.  He denies any new medications. In the ED, the patient was afebrile and hemodynamically stable.  WBC 9.9, hemoglobin 10.1, platelets 223,000.  CT of the abdomen and pelvis showed pancolitis with loose stool.  There is sigmoid diverticulosis.  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  C. difficile and stool pathogen panel were ordered.   Assessment/Plan: Colitis--Campylobacter -stool for cdiff--neg -stool pathogen panel--POS Campylobacter -check COVID--neg -3/19 CT abd as discussed above -initially empiric cipro and flagyl>>switch to azithro -slowly improving with decreasing stools   Intractable vomiting -likely gastroenteritis -pantoprazole bid -clear liquids started initially -advance to full liquids--continue for now -start zofran ATC -short course metoclopramide   Hypokalemia -add KCl to IVF -check mag--2.0   Hyponatremia -due to volume depletion and poor solute intake   coronary artery disease -no chest pain -continue imdur   Essential HTN -continue amlodipine -holding losartan   Hypothyroidism -continue synthroid                 Family Communication: no  Family at bedside   Consultants:  GI   Code Status:   FULL    DVT Prophylaxis:  [place] Heparin      Procedures: As Listed in Progress Note Above   Antibiotics: Cipro 3/19>>3/21 Flagyl 3/19>>3/21 Azithro 3/21>>                Subjective: Pt states diarrhea and abd pain are improving.  Denies f/c, cp, sob.  Has nausea, no vomiting  Objective: Vitals:   07/27/22 1512 07/27/22 2137 07/28/22 0626 07/28/22 1207  BP: 117/69 118/67 111/76 108/88  Pulse: 64 79 75 87  Resp: 10 20 18 18   Temp: (!) 97.5 F (36.4 C) 98.2 F (36.8 C) (!) 97.5 F (36.4 C) 98.7 F (37.1 C)  TempSrc: Oral Oral Oral Oral  SpO2: 95% 96% 94% 92%  Weight:      Height:        Intake/Output Summary (Last 24 hours) at 07/28/2022 1845 Last data filed at 07/28/2022 1700 Gross per 24 hour  Intake 1452.4 ml  Output --  Net 1452.4 ml   Weight change:  Exam:  General:  Pt is alert, follows commands appropriately, not in acute distress HEENT: No icterus, No thrush, No neck mass, [place]/AT Cardiovascular: RRR, S1/S2, no rubs, no gallops Respiratory: CTA bilaterally, no wheezing, no crackles, no rhonchi Abdomen: Soft/+BS, mild diffues tender, non distended, no guarding Extremities: No edema, No lymphangitis, No petechiae, No rashes, no synovitis   Data Reviewed: I have personally reviewed following labs and imaging studies Basic Metabolic Panel: Recent Labs  Lab 07/25/22 1834 07/26/22 0426 07/27/22 0402 07/28/22 0441  NA 131* 132* 135 135  K 3.0* 3.1* 3.5 3.7  CL 95* 99 101 102  CO2 25 24 25 24   GLUCOSE 138* 137* 126* 120*  BUN 8 8 6 6   CREATININE 1.04 0.98 1.10 1.09  CALCIUM 8.4* 7.9* 8.1* 8.1*  MG  --  1.9 2.0 2.0   Liver Function Tests: Recent Labs  Lab 07/25/22 1833 07/26/22 0426  AST 26 24  ALT 37 30  ALKPHOS 117 98  BILITOT 1.2 1.0  PROT 7.3 6.1*  ALBUMIN 3.7 3.0*   Recent Labs  Lab 07/25/22 1833  LIPASE 21   No results for input(s): "AMMONIA" in the last 168 hours. Coagulation Profile: No results for input(s): "INR",  "PROTIME" in the last 168 hours. CBC: Recent Labs  Lab 07/25/22 1834 07/26/22 0426 07/27/22 0402  WBC 9.9 9.0 7.7  NEUTROABS  --  5.6  --   HGB 15.1 12.8* 12.7*  HCT 41.8 36.2* 35.8*  MCV 86.7 87.9 88.0  PLT 249 223 220   Cardiac Enzymes: No results for input(s): "CKTOTAL", "CKMB", "CKMBINDEX", "TROPONINI" in the last 168 hours. BNP: Invalid input(s): "POCBNP" CBG: No results for input(s): "GLUCAP" in the last 168 hours. HbA1C: Recent Labs    07/27/22 0402  HGBA1C 7.6*   Urine analysis:    Component Value Date/Time   COLORURINE YELLOW 07/25/2022 1919   APPEARANCEUR CLEAR 07/25/2022 1919   LABSPEC 1.015 07/25/2022 1919   PHURINE 6.0 07/25/2022 1919   GLUCOSEU NEGATIVE 07/25/2022 1919   HGBUR NEGATIVE 07/25/2022 [place] NEGATIVE 07/25/2022 [place] NEGATIVE 07/25/2022 1919   PROTEINUR NEGATIVE 07/25/2022 1919   UROBILINOGEN 0.2 03/31/2014 0150   NITRITE NEGATIVE 07/25/2022 1919   LEUKOCYTESUR NEGATIVE 07/25/2022 1919   Sepsis Labs: @LABRCNTIP (procalcitonin:4,lacticidven:4) ) Recent Results (from the past 240 hour(s))  Gastrointestinal Panel by PCR , Stool     Status: Abnormal   Collection Time: 07/26/22 10:50 AM   Specimen: STOOL  Result Value Ref Range Status   Campylobacter species DETECTED (A) NOT DETECTED Final    Comment: RESULT CALLED TO, READ BACK BY AND VERIFIED WITH: Morris [id number] 07/27/22 HNM    Plesimonas shigelloides NOT DETECTED NOT DETECTED Final   Salmonella species NOT DETECTED NOT DETECTED Final   Yersinia enterocolitica NOT DETECTED NOT DETECTED Final   Vibrio species NOT DETECTED NOT DETECTED Final   Vibrio cholerae NOT DETECTED NOT DETECTED Final   Enteroaggregative E coli (EAEC) NOT DETECTED NOT DETECTED Final   Enteropathogenic E coli (EPEC) NOT DETECTED NOT DETECTED Final   Enterotoxigenic E coli (ETEC) NOT DETECTED NOT DETECTED Final   Shiga like toxin producing E coli (STEC) NOT DETECTED NOT DETECTED  Final   Shigella/Enteroinvasive E coli (EIEC) NOT DETECTED NOT DETECTED Final   Cryptosporidium NOT DETECTED NOT DETECTED Final   Cyclospora cayetanensis NOT DETECTED NOT DETECTED Final   Entamoeba histolytica NOT DETECTED NOT DETECTED Final   Giardia lamblia NOT DETECTED NOT DETECTED Final   Adenovirus F40/41 NOT DETECTED NOT DETECTED Final   Astrovirus NOT DETECTED NOT DETECTED Final   Norovirus GI/GII NOT DETECTED NOT DETECTED Final   Rotavirus A NOT DETECTED NOT DETECTED Final   Sapovirus (I, II, IV, and V) NOT DETECTED NOT DETECTED Final    Comment: Performed at [facility], [address] [place], [place] [address]  C Difficile Quick Screen w PCR reflex     Status: None   Collection Time: 07/26/22 10:50 AM   Specimen: STOOL  Result Value Ref Range Status   C Diff antigen NEGATIVE NEGATIVE Final   C Diff toxin  NEGATIVE NEGATIVE Final   C Diff interpretation No C. difficile detected.  Final    Comment: Performed at [facility], [address] [place], [place] [address]  Resp panel by RT-PCR (RSV, Flu A&B, Covid) Anterior Nasal Swab     Status: None   Collection Time: 07/26/22 10:51 AM   Specimen: Anterior Nasal Swab  Result Value Ref Range Status   SARS Coronavirus 2 by RT PCR NEGATIVE NEGATIVE Final    Comment: (NOTE) SARS-CoV-2 target nucleic acids are NOT DETECTED.  The SARS-CoV-2 RNA is generally detectable in upper respiratory specimens during the acute phase of infection. The lowest concentration of SARS-CoV-2 viral copies this assay can detect is 138 copies/mL. A negative result does not preclude SARS-Cov-2 infection and should not be used as the sole basis for treatment or other patient management decisions. A negative result may occur with  improper specimen collection/handling, submission of specimen other than nasopharyngeal swab, presence of viral mutation(s) within the areas targeted by this assay, and inadequate number of viral copies(<138  copies/mL). A negative result must be combined with clinical observations, patient history, and epidemiological information. The expected result is Negative.  Fact Sheet for Patients:  EntrepreneurPulse.com.au  Fact Sheet for Healthcare Providers:  IncredibleEmployment.be  This test is no t yet approved or cleared by the [place] FDA and  has been authorized for detection and/or diagnosis of SARS-CoV-2 by FDA under an Emergency Use Authorization (EUA). This EUA will remain  in effect (meaning this test can be used) for the duration of the COVID-19 declaration under Section 564(b)(1) of the Act, 21 U.S.C.section 360bbb-3(b)(1), unless the authorization is terminated  or revoked sooner.       Influenza A by PCR NEGATIVE NEGATIVE Final   Influenza B by PCR NEGATIVE NEGATIVE Final    Comment: (NOTE) The Xpert Xpress SARS-CoV-2/FLU/RSV plus assay is intended as an aid in the diagnosis of influenza from Nasopharyngeal swab specimens and should not be used as a sole basis for treatment. Nasal washings and aspirates are unacceptable for Xpert Xpress SARS-CoV-2/FLU/RSV testing.  Fact Sheet for Patients: EntrepreneurPulse.com.au  Fact Sheet for Healthcare Providers: IncredibleEmployment.be  This test is not yet approved or cleared by the [place] FDA and has been authorized for detection and/or diagnosis of SARS-CoV-2 by FDA under an Emergency Use Authorization (EUA). This EUA will remain in effect (meaning this test can be used) for the duration of the COVID-19 declaration under Section 564(b)(1) of the Act, 21 U.S.C. section 360bbb-3(b)(1), unless the authorization is terminated or revoked.     Resp Syncytial Virus by PCR NEGATIVE NEGATIVE Final    Comment: (NOTE) Fact Sheet for Patients: EntrepreneurPulse.com.au  Fact Sheet for Healthcare  Providers: IncredibleEmployment.be  This test is not yet approved or cleared by the [place] FDA and has been authorized for detection and/or diagnosis of SARS-CoV-2 by FDA under an Emergency Use Authorization (EUA). This EUA will remain in effect (meaning this test can be used) for the duration of the COVID-19 declaration under Section 564(b)(1) of the Act, 21 U.S.C. section 360bbb-3(b)(1), unless the authorization is terminated or revoked.  Performed at [facility], [address] [place], [place] [address]      Scheduled Meds:  amLODipine  5 mg Oral Daily   azithromycin  500 mg Oral Daily   heparin  5,000 Units Subcutaneous Q8H   isosorbide mononitrate  30 mg Oral Daily   levothyroxine  300 mcg Oral QAC breakfast   metoCLOPramide (REGLAN) injection  5 mg Intravenous Q6H   ondansetron (ZOFRAN) IV  4 mg Intravenous Q6H   pantoprazole (PROTONIX) IV  40 mg Intravenous Q12H   Continuous Infusions:  Procedures/Studies: CT ABDOMEN PELVIS WO CONTRAST  Result Date: 07/25/2022 CLINICAL DATA:  Left lower quadrant abdominal pain. EXAM: CT ABDOMEN AND PELVIS WITHOUT CONTRAST TECHNIQUE: Multidetector CT imaging of the abdomen and pelvis was performed following the standard protocol without IV contrast. RADIATION DOSE REDUCTION: This exam was performed according to the departmental dose-optimization program which includes automated exposure control, adjustment of the mA and/or kV according to patient size and/or use of iterative reconstruction technique. COMPARISON:  CT abdomen pelvis dated 04/10/2019. FINDINGS: Evaluation of this exam is limited in the absence of intravenous contrast. Lower chest: The visualized lung bases are clear. No intra-abdominal free air or free fluid. Hepatobiliary: The liver is unremarkable. No biliary ductal dilatation. Cholecystectomy. No retained calcified stone noted in the central CBD. Pancreas: Unremarkable. No pancreatic ductal dilatation or  surrounding inflammatory changes. Spleen: Normal in size without focal abnormality. Adrenals/Urinary Tract: The adrenal glands unremarkable. The kidneys, visualized ureters, and urinary bladder appear unremarkable. Stomach/Bowel: There is sigmoid diverticulosis without active inflammatory changes. 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 The cecum is located in the left lower abdomen. There is mild diffuse thickened appearance of the colon with pericolonic stranding and edema consistent with colitis. Loose stool within the colon consistent with diarrheal state. There is no bowel obstruction. The appendix is not visualized. Vascular/Lymphatic: Mild aortoiliac atherosclerotic disease. The IVC is unremarkable. No portal venous gas. There is no adenopathy. Reproductive: The prostate and seminal vesicles are grossly unremarkable. No pelvic mass. Other: None Musculoskeletal: Bilateral total hip arthroplasties. No acute osseous pathology. IMPRESSION: 1. Pancolitis with diarrheal state. No bowel obstruction. 2. Sigmoid diverticulosis. 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 4.  Aortic Atherosclerosis (ICD10-I70.0). Electronically Signed   By: Anner Crete M.D.   On: 07/25/2022 20:00    Orson Eva, DO  [facility]  If 7PM-7AM, please contact night-coverage [email or web address] Password [facility] 07/28/2022, 6:45 PM   LOS: 2 days

## 2022-07-29 DIAGNOSIS — R112 Nausea with vomiting, unspecified: Secondary | ICD-10-CM | POA: Diagnosis not present

## 2022-07-29 DIAGNOSIS — K5100 Ulcerative (chronic) pancolitis without complications: Secondary | ICD-10-CM | POA: Diagnosis not present

## 2022-07-29 DIAGNOSIS — K529 Noninfective gastroenteritis and colitis, unspecified: Secondary | ICD-10-CM | POA: Diagnosis not present

## 2022-07-29 DIAGNOSIS — A045 Campylobacter enteritis: Secondary | ICD-10-CM | POA: Diagnosis not present

## 2022-07-29 LAB — BASIC METABOLIC PANEL
Anion gap: 9 (ref 5–15)
BUN: 5 mg/dL — ABNORMAL LOW (ref 6–20)
CO2: 24 mmol/L (ref 22–32)
Calcium: 8.3 mg/dL — ABNORMAL LOW (ref 8.9–10.3)
Chloride: 100 mmol/L (ref 98–111)
Creatinine, Ser: 1.14 mg/dL (ref 0.61–1.24)
GFR, Estimated: >60 mL/min (ref >60)
Glucose, Bld: 125 mg/dL — ABNORMAL HIGH (ref 70–99)
Potassium: 3.8 mmol/L (ref 3.5–5.1)
Sodium: 133 mmol/L — ABNORMAL LOW (ref 135–145)

## 2022-07-29 LAB — CBC
HCT: 37.3 % — ABNORMAL LOW (ref 39.0–52.0)
Hemoglobin: 13.1 g/dL (ref 13.0–17.0)
MCH: 31.4 pg (ref 26.0–34.0)
MCHC: 35.1 g/dL (ref 30.0–36.0)
MCV: 89.4 fL (ref 80.0–100.0)
Platelets: 268 10*3/uL (ref 150–400)
RBC: 4.17 MIL/uL — ABNORMAL LOW (ref 4.22–5.81)
RDW: 12.5 % (ref 11.5–15.5)
WBC: 7.7 10*3/uL (ref 4.0–10.5)
nRBC: 0.3 % — ABNORMAL HIGH (ref 0.0–0.2)

## 2022-07-29 MED ORDER — ONDANSETRON 4 MG PO TBDP: 4.0000 mg | ORAL_TABLET | Freq: Three times a day (TID) | ORAL | 0 refills | Status: DC | PRN

## 2022-07-29 MED ORDER — METOCLOPRAMIDE HCL 5 MG PO TABS: 5.0000 mg | ORAL_TABLET | Freq: Three times a day (TID) | ORAL | 0 refills | Status: DC

## 2022-07-29 MED ORDER — METOCLOPRAMIDE HCL 10 MG PO TABS: 5.0000 mg | ORAL_TABLET | Freq: Three times a day (TID) | ORAL | Status: DC

## 2022-07-29 MED ORDER — ONDANSETRON 4 MG PO TBDP: 4.0000 mg | ORAL_TABLET | Freq: Three times a day (TID) | ORAL | Status: DC | PRN

## 2022-07-29 MED ORDER — AZITHROMYCIN 500 MG PO TABS: 500.0000 mg | ORAL_TABLET | Freq: Every day | ORAL | 0 refills | Status: DC

## 2022-07-29 NOTE — Progress Notes (Signed)
Patient reports diarrhea nearly resolved.  Cramping minimal.  Tolerating regular diet  Vital signs in last 24 hours: Temp:  [98.7 F (37.1 C)-99.2 F (37.3 C)] 98.7 F (37.1 C) (03/23 0543) Pulse Rate:  [80-87] 80 (03/23 0543) Resp:  [18-20] 20 (03/23 0543) BP: (108-132)/(74-88) 128/76 (03/23 0543) SpO2:  [92 %-93 %] 93 % (03/23 0543) Last BM Date : 07/27/22 General:   Alert,  , pleasant and cooperative in NAD Abdomen: Nondistended.  Soft and nontender    Intake/Output from previous day: 03/22 0701 - 03/23 0700 In: 720 [P.O.:720] Out: -  Intake/Output this shift: Total I/O In: 240 [P.O.:240] Out: -   Lab Results: Recent Labs    07/27/22 0402 07/29/22 0513  WBC 7.7 7.7  HGB 12.7* 13.1  HCT 35.8* 37.3*  PLT 220 268   BMET Recent Labs    07/27/22 0402 07/28/22 0441 07/29/22 0513  NA 135 135 133*  K 3.5 3.7 3.8  CL 101 102 100  CO2 25 24 24   GLUCOSE 126* 120* 125*  BUN 6 6 5*  CREATININE 1.10 1.09 1.14  CALCIUM 8.1* 8.1* 8.3*    Impression: 57 year old gentleman admitted to the hospital with colitis found to be secondary to Campylobacter.  He has improved nicely with antibiotics. History of elevated LFTs and mildly elevated saturation.  Recommendations:  Complete a 7-day course of azithromycin 500 mg daily  He should follow-up with Dr. Abbey Chatters in 4 to 6 weeks to reassess and delve into liver enzyme issue further (I will arrange follow-up)  From a GI standpoint he could likely be discharged later today or tomorrow.      07/29/2022, 12:01 PM

## 2022-07-29 NOTE — Discharge Summary (Addendum)
Physician Discharge Summary   Patient: Andrew Love MRN: [id number] DOB: 15-Jul-1965  Admit date:     07/25/2022  Discharge date: 07/29/22  Discharge Physician: Shanon Brow Shanetha Bradham   [facility]   Recommendations at discharge:   Please follow up with primary care provider within 1-2 weeks  Please repeat BMP and CBC in one week     Hospital Course: 57 year old male with a history of diverticulitis status post sigmoid colectomy 2012, hypertension, hyperlipidemia, coronary disease presenting with 1 week history of diarrhea, nausea, vomiting, and abdominal pain.  The patient began developing fevers up to 101.7 F on 07/21/2022.  Since then, his fevers are trending down with a Tmax up to 100.0.  He denies any hematochezia or melena.  There is no hematemesis.  He denies any recent travels.  He denies any recent antibiotics.  He denies any sick contacts.  The patient states that he ate a hotdog at a local establishment prior to development of his symptoms.  He denies any new medications. In the ED, the patient was afebrile and hemodynamically stable.  WBC 9.9, hemoglobin 10.1, platelets 223,000.  CT of the abdomen and pelvis showed pancolitis with loose stool.  There is sigmoid diverticulosis.  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  C. difficile and stool pathogen panel were ordered.  Assessment and Plan: Colitis--Campylobacter -stool for cdiff--neg -stool pathogen panel--POS Campylobacter -check COVID--neg -3/19 CT abd as discussed above -initially empiric cipro and flagyl>>switch to azithro>>4 more days to complete 7 day course -slowly improving with decreasing stools   Intractable vomiting -likely gastroenteritis -pantoprazole bid -clear liquids started initially -advance to full liquids>>soft diet -start zofran ATC -short course metoclopramide--gave 3 additional days supply after d/c -diet advanced to soft diet 3/23 and he tolerated it well    Hypokalemia -add KCl to IVF -check mag--2.0   Hyponatremia -due to volume depletion and poor solute intake   coronary artery disease -no chest pain -continue imdur   Essential HTN -continue amlodipine -holding losartan>>will not restart as BP remains well controled   Hypothyroidism -continue synthroid         Consultants: GI Procedures performed: none  Disposition: Home Diet recommendation:  Soft diet DISCHARGE MEDICATION: Allergies as of 07/29/2022       Reactions   Dust Mite Mixed Allergen Ext [mite (d. Farinae)] Anaphylaxis, Hives   Other Hives, Shortness Of Breath   Cockroaches   Iohexol Hives   Almond (diagnostic) Hives   Crestor [rosuvastatin]    Muscle/joint pain    Oysters [shellfish Allergy] Hives   Wheat Hives   Yeast-related Products Hives        Medication List     STOP taking these medications    amLODipine 5 MG tablet Commonly known as: NORVASC   losartan 100 MG tablet Commonly known as: COZAAR   ondansetron 4 MG tablet Commonly known as: ZOFRAN   predniSONE 20 MG tablet Commonly known as: DELTASONE       TAKE these medications    acetaminophen 500 MG tablet Commonly known as: TYLENOL Take 500-1,000 mg by mouth every 6 (six) hours as needed for mild pain or fever.   azithromycin 500 MG tablet Commonly known as: ZITHROMAX Take 1 tablet (500 mg total) by mouth daily. Start taking on: July 30, 2022   buPROPion 150 MG 12 hr tablet Commonly known as: WELLBUTRIN SR Take 150 mg by mouth See admin instructions. Take 2 tablets by mouth every morning for mood.   cholecalciferol 25  MCG (1000 UNIT) tablet Commonly known as: VITAMIN D3 Take 1,000 Units by mouth at bedtime.   diphenhydrAMINE 25 MG tablet Commonly known as: BENADRYL Take 25-50 mg by mouth every 6 (six) hours as needed for allergies.   EPINEPHrine 0.3 mg/0.3 mL Soaj injection Commonly known as: EpiPen Inject 0.3 mLs (0.3 mg total) into the muscle as  needed. What changed: reasons to take this   famotidine 40 MG tablet Commonly known as: PEPCID Take 40 mg by mouth daily as needed for indigestion or heartburn.   Fish Oil 1000 MG Caps Take 2,000 mg by mouth daily.   gabapentin 300 MG capsule Commonly known as: NEURONTIN Take 600 mg by mouth at bedtime.   ibuprofen 200 MG tablet Commonly known as: ADVIL Take 800 mg by mouth every 8 (eight) hours as needed for moderate pain.   ipratropium 0.03 % nasal spray Commonly known as: ATROVENT Place 1-2 sprays into both nostrils See admin instructions. Spray 1-2 sprays into each nostril two to three times a day for rhintis   isosorbide mononitrate 30 MG 24 hr tablet Commonly known as: IMDUR Take 1 tablet (30 mg total) by mouth daily.   ketotifen 0.025 % ophthalmic solution Commonly known as: ZADITOR Place 1 drop into both eyes 2 (two) times daily.   levothyroxine 300 MCG tablet Commonly known as: SYNTHROID Take 300 mcg by mouth every morning.   loratadine 10 MG tablet Commonly known as: CLARITIN Take 10 mg by mouth daily.   meclizine 25 MG tablet Commonly known as: ANTIVERT Take 1 tablet (25 mg total) by mouth 3 (three) times daily as needed for dizziness.   metoCLOPramide 5 MG tablet Commonly known as: REGLAN Take 1 tablet (5 mg total) by mouth 3 (three) times daily before meals.   mirtazapine 15 MG tablet Commonly known as: REMERON Take 7.5 mg by mouth at bedtime.   naloxone 4 MG/0.1ML Liqd nasal spray kit Commonly known as: NARCAN Place 1 spray into the nose once.   nitroGLYCERIN 0.4 MG SL tablet Commonly known as: Nitrostat Place 1 tablet (0.4 mg total) under the tongue every 5 (five) minutes as needed for chest pain. Up to 3 tablets then call 911 or go to the nearest ED.   omeprazole 40 MG capsule Commonly known as: PRILOSEC Take 1 capsule (40 mg total) by mouth 2 (two) times daily. What changed: when to take this   ondansetron 4 MG disintegrating  tablet Commonly known as: ZOFRAN-ODT Take 1 tablet (4 mg total) by mouth every 8 (eight) hours as needed for nausea or vomiting.   oxyCODONE 5 MG immediate release tablet Commonly known as: Roxicodone Take 1 tablet (5 mg total) by mouth every 4 (four) hours as needed for severe pain.   rosuvastatin 10 MG tablet Commonly known as: CRESTOR Take 5 mg by mouth daily.   tamsulosin 0.4 MG Caps capsule Commonly known as: FLOMAX Take 1 capsule by mouth daily.   Visine Advanced Relief 0.05-0.1-1-1 % Soln Generic drug: Tetrahydroz-Dextran-PEG-Povid Place 1 drop into both eyes daily as needed (dry eyes).        Discharge Exam: Filed Weights   07/25/22 1703  Weight: 106.1 kg   HEENT:  [place]/AT, No thrush, no icterus CV:  RRR, no rub, no S3, no S4 Lung:  CTA, no wheeze, no rhonchi Abd:  soft/+BS, mild upper abd tenderness Ext:  No edema, no lymphangitis, no synovitis, no rash   Condition at discharge: stable  The results of significant diagnostics from  this hospitalization (including imaging, microbiology, ancillary and laboratory) are listed below for reference.   Imaging Studies: CT ABDOMEN PELVIS WO CONTRAST  Result Date: 07/25/2022 CLINICAL DATA:  Left lower quadrant abdominal pain. EXAM: CT ABDOMEN AND PELVIS WITHOUT CONTRAST TECHNIQUE: Multidetector CT imaging of the abdomen and pelvis was performed following the standard protocol without IV contrast. RADIATION DOSE REDUCTION: This exam was performed according to the departmental dose-optimization program which includes automated exposure control, adjustment of the mA and/or kV according to patient size and/or use of iterative reconstruction technique. COMPARISON:  CT abdomen pelvis dated 04/10/2019. FINDINGS: Evaluation of this exam is limited in the absence of intravenous contrast. Lower chest: The visualized lung bases are clear. No intra-abdominal free air or free fluid. Hepatobiliary: The liver is unremarkable. No biliary ductal  dilatation. Cholecystectomy. No retained calcified stone noted in the central CBD. Pancreas: Unremarkable. No pancreatic ductal dilatation or surrounding inflammatory changes. Spleen: Normal in size without focal abnormality. Adrenals/Urinary Tract: The adrenal glands unremarkable. The kidneys, visualized ureters, and urinary bladder appear unremarkable. Stomach/Bowel: There is sigmoid diverticulosis without active inflammatory changes. 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 The cecum is located in the left lower abdomen. There is mild diffuse thickened appearance of the colon with pericolonic stranding and edema consistent with colitis. Loose stool within the colon consistent with diarrheal state. There is no bowel obstruction. The appendix is not visualized. Vascular/Lymphatic: Mild aortoiliac atherosclerotic disease. The IVC is unremarkable. No portal venous gas. There is no adenopathy. Reproductive: The prostate and seminal vesicles are grossly unremarkable. No pelvic mass. Other: None Musculoskeletal: Bilateral total hip arthroplasties. No acute osseous pathology. IMPRESSION: 1. Pancolitis with diarrheal state. No bowel obstruction. 2. Sigmoid diverticulosis. 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 4.  Aortic Atherosclerosis (ICD10-I70.0). Electronically Signed   By: Anner Crete M.D.   On: 07/25/2022 20:00    Microbiology: Results for orders placed or performed during the hospital encounter of 07/25/22  Gastrointestinal Panel by PCR , Stool     Status: Abnormal   Collection Time: 07/26/22 10:50 AM   Specimen: STOOL  Result Value Ref Range Status   Campylobacter species DETECTED (A) NOT DETECTED Final    Comment: RESULT CALLED TO, READ BACK BY AND VERIFIED WITH: Gilbert [id number] 07/27/22 HNM    Plesimonas shigelloides NOT DETECTED NOT DETECTED Final   Salmonella species NOT DETECTED NOT DETECTED Final   Yersinia enterocolitica NOT DETECTED NOT DETECTED Final    Vibrio species NOT DETECTED NOT DETECTED Final   Vibrio cholerae NOT DETECTED NOT DETECTED Final   Enteroaggregative E coli (EAEC) NOT DETECTED NOT DETECTED Final   Enteropathogenic E coli (EPEC) NOT DETECTED NOT DETECTED Final   Enterotoxigenic E coli (ETEC) NOT DETECTED NOT DETECTED Final   Shiga like toxin producing E coli (STEC) NOT DETECTED NOT DETECTED Final   Shigella/Enteroinvasive E coli (EIEC) NOT DETECTED NOT DETECTED Final   Cryptosporidium NOT DETECTED NOT DETECTED Final   Cyclospora cayetanensis NOT DETECTED NOT DETECTED Final   Entamoeba histolytica NOT DETECTED NOT DETECTED Final   Giardia lamblia NOT DETECTED NOT DETECTED Final   Adenovirus F40/41 NOT DETECTED NOT DETECTED Final   Astrovirus NOT DETECTED NOT DETECTED Final   Norovirus GI/GII NOT DETECTED NOT DETECTED Final   Rotavirus A NOT DETECTED NOT DETECTED Final   Sapovirus (I, II, IV, and V) NOT DETECTED NOT DETECTED Final    Comment: Performed at [facility], [address] [place], [place] [address]  C  Difficile Quick Screen w PCR reflex     Status: None   Collection Time: 07/26/22 10:50 AM   Specimen: STOOL  Result Value Ref Range Status   C Diff antigen NEGATIVE NEGATIVE Final   C Diff toxin NEGATIVE NEGATIVE Final   C Diff interpretation No C. difficile detected.  Final    Comment: Performed at [facility], [address], [place], [place] [address]  Resp panel by RT-PCR (RSV, Flu A&B, Covid) Anterior Nasal Swab     Status: None   Collection Time: 07/26/22 10:51 AM   Specimen: Anterior Nasal Swab  Result Value Ref Range Status   SARS Coronavirus 2 by RT PCR NEGATIVE NEGATIVE Final    Comment: (NOTE) SARS-CoV-2 target nucleic acids are NOT DETECTED.  The SARS-CoV-2 RNA is generally detectable in upper respiratory specimens during the acute phase of infection. The lowest concentration of SARS-CoV-2 viral copies this assay can detect is 138 copies/mL. A negative result does not preclude  SARS-Cov-2 infection and should not be used as the sole basis for treatment or other patient management decisions. A negative result may occur with  improper specimen collection/handling, submission of specimen other than nasopharyngeal swab, presence of viral mutation(s) within the areas targeted by this assay, and inadequate number of viral copies(<138 copies/mL). A negative result must be combined with clinical observations, patient history, and epidemiological information. The expected result is Negative.  Fact Sheet for Patients:  EntrepreneurPulse.com.au  Fact Sheet for Healthcare Providers:  IncredibleEmployment.be  This test is no t yet approved or cleared by the [place] FDA and  has been authorized for detection and/or diagnosis of SARS-CoV-2 by FDA under an Emergency Use Authorization (EUA). This EUA will remain  in effect (meaning this test can be used) for the duration of the COVID-19 declaration under Section 564(b)(1) of the Act, 21 U.S.C.section 360bbb-3(b)(1), unless the authorization is terminated  or revoked sooner.       Influenza A by PCR NEGATIVE NEGATIVE Final   Influenza B by PCR NEGATIVE NEGATIVE Final    Comment: (NOTE) The Xpert Xpress SARS-CoV-2/FLU/RSV plus assay is intended as an aid in the diagnosis of influenza from Nasopharyngeal swab specimens and should not be used as a sole basis for treatment. Nasal washings and aspirates are unacceptable for Xpert Xpress SARS-CoV-2/FLU/RSV testing.  Fact Sheet for Patients: EntrepreneurPulse.com.au  Fact Sheet for Healthcare Providers: IncredibleEmployment.be  This test is not yet approved or cleared by the [place] FDA and has been authorized for detection and/or diagnosis of SARS-CoV-2 by FDA under an Emergency Use Authorization (EUA). This EUA will remain in effect (meaning this test can be used) for the duration of  the COVID-19 declaration under Section 564(b)(1) of the Act, 21 U.S.C. section 360bbb-3(b)(1), unless the authorization is terminated or revoked.     Resp Syncytial Virus by PCR NEGATIVE NEGATIVE Final    Comment: (NOTE) Fact Sheet for Patients: EntrepreneurPulse.com.au  Fact Sheet for Healthcare Providers: IncredibleEmployment.be  This test is not yet approved or cleared by the [place] FDA and has been authorized for detection and/or diagnosis of SARS-CoV-2 by FDA under an Emergency Use Authorization (EUA). This EUA will remain in effect (meaning this test can be used) for the duration of the COVID-19 declaration under Section 564(b)(1) of the Act, 21 U.S.C. section 360bbb-3(b)(1), unless the authorization is terminated or revoked.  Performed at [facility], [address], [place], [place] [address]     Labs: CBC: Recent Labs  Lab 07/25/22 [phone]  07/26/22 0426 07/27/22 0402 07/29/22 0513  WBC 9.9 9.0 7.7 7.7  NEUTROABS  --  5.6  --   --   HGB 15.1 12.8* 12.7* 13.1  HCT 41.8 36.2* 35.8* 37.3*  MCV 86.7 87.9 88.0 89.4  PLT 249 223 220 [id number]   Basic Metabolic Panel: Recent Labs  Lab 07/25/22 1834 07/26/22 0426 07/27/22 0402 07/28/22 0441 07/29/22 0513  NA 131* 132* 135 135 133*  K 3.0* 3.1* 3.5 3.7 3.8  CL 95* 99 101 102 100  CO2 25 24 25 24 24   GLUCOSE 138* 137* 126* 120* 125*  BUN 8 8 6 6  5*  CREATININE 1.04 0.98 1.10 1.09 1.14  CALCIUM 8.4* 7.9* 8.1* 8.1* 8.3*  MG  --  1.9 2.0 2.0  --    Liver Function Tests: Recent Labs  Lab 07/25/22 1833 07/26/22 0426  AST 26 24  ALT 37 30  ALKPHOS 117 98  BILITOT 1.2 1.0  PROT 7.3 6.1*  ALBUMIN 3.7 3.0*   CBG: No results for input(s): "GLUCAP" in the last 168 hours.  Discharge time spent: greater than 30 minutes.  Signed: Orson Eva, MD [facility] 07/29/2022

## 2022-07-31 ENCOUNTER — Telehealth: Payer: Self-pay | Admitting: Gastroenterology

## 2022-07-31 DIAGNOSIS — A045 Campylobacter enteritis: Secondary | ICD-10-CM

## 2022-07-31 NOTE — Telephone Encounter (Signed)
Patient needs nonurgent hospital followup/follow up abnormal lfts/ferritin in about 2 months.

## 2022-08-21 ENCOUNTER — Encounter: Payer: Self-pay | Admitting: Gastroenterology

## 2022-12-19 DIAGNOSIS — G4733 Obstructive sleep apnea (adult) (pediatric): Secondary | ICD-10-CM | POA: Insufficient documentation

## 2023-10-07 HISTORY — PX: ABLATION: SHX5711

## 2023-12-01 ENCOUNTER — Emergency Department (HOSPITAL_COMMUNITY)

## 2023-12-01 ENCOUNTER — Emergency Department (HOSPITAL_COMMUNITY)
Admission: EM | Admit: 2023-12-01 | Discharge: 2023-12-02 | Disposition: A | Discharge status: Home / Self Care | Attending: Emergency Medicine | Admitting: Emergency Medicine

## 2023-12-01 ENCOUNTER — Encounter (HOSPITAL_COMMUNITY): Payer: Self-pay

## 2023-12-01 DIAGNOSIS — H538 Other visual disturbances: Secondary | ICD-10-CM | POA: Insufficient documentation

## 2023-12-01 DIAGNOSIS — R531 Weakness: Secondary | ICD-10-CM | POA: Diagnosis not present

## 2023-12-01 DIAGNOSIS — R42 Dizziness and giddiness: Secondary | ICD-10-CM | POA: Insufficient documentation

## 2023-12-01 DIAGNOSIS — R202 Paresthesia of skin: Secondary | ICD-10-CM | POA: Insufficient documentation

## 2023-12-01 DIAGNOSIS — R110 Nausea: Secondary | ICD-10-CM | POA: Diagnosis not present

## 2023-12-01 LAB — COMPREHENSIVE METABOLIC PANEL WITH GFR
ALT: 42 U/L (ref 0–44)
AST: 27 U/L (ref 15–41)
Albumin: 4 g/dL (ref 3.5–5.0)
Alkaline Phosphatase: 85 U/L (ref 38–126)
Anion gap: 14 (ref 5–15)
BUN: 16 mg/dL (ref 6–20)
CO2: 23 mmol/L (ref 22–32)
Calcium: 9.2 mg/dL (ref 8.9–10.3)
Chloride: 101 mmol/L (ref 98–111)
Creatinine, Ser: 1.13 mg/dL (ref 0.61–1.24)
GFR, Estimated: >60 mL/min (ref >60)
Glucose, Bld: 217 mg/dL — ABNORMAL HIGH (ref 70–99)
Potassium: 4 mmol/L (ref 3.5–5.1)
Sodium: 138 mmol/L (ref 135–145)
Total Bilirubin: 0.7 mg/dL (ref 0.0–1.2)
Total Protein: 7.4 g/dL (ref 6.5–8.1)

## 2023-12-01 LAB — CBC
HCT: 46.2 % (ref 39.0–52.0)
Hemoglobin: 16.2 g/dL (ref 13.0–17.0)
MCH: 32.1 pg (ref 26.0–34.0)
MCHC: 35.1 g/dL (ref 30.0–36.0)
MCV: 91.5 fL (ref 80.0–100.0)
Platelets: 257 K/uL (ref 150–400)
RBC: 5.05 MIL/uL (ref 4.22–5.81)
RDW: 12.9 % (ref 11.5–15.5)
WBC: 9.5 K/uL (ref 4.0–10.5)
nRBC: 0 % (ref 0.0–0.2)

## 2023-12-01 LAB — CBG MONITORING, ED: Glucose-Capillary: 217 mg/dL — ABNORMAL HIGH (ref 70–99)

## 2023-12-01 LAB — DIFFERENTIAL
Abs Immature Granulocytes: 0.11 K/uL — ABNORMAL HIGH (ref 0.00–0.07)
Basophils Absolute: 0.1 K/uL (ref 0.0–0.1)
Basophils Relative: 1 %
Eosinophils Absolute: 0.2 K/uL (ref 0.0–0.5)
Eosinophils Relative: 2 %
Immature Granulocytes: 1 %
Lymphocytes Relative: 30 %
Lymphs Abs: 2.8 K/uL (ref 0.7–4.0)
Monocytes Absolute: 0.8 K/uL (ref 0.1–1.0)
Monocytes Relative: 9 %
Neutro Abs: 5.5 K/uL (ref 1.7–7.7)
Neutrophils Relative %: 57 %

## 2023-12-01 LAB — PROTIME-INR
INR: 0.9 (ref 0.8–1.2)
Prothrombin Time: 12.8 s (ref 11.4–15.2)

## 2023-12-01 LAB — APTT: aPTT: 27 s (ref 24–36)

## 2023-12-01 LAB — ETHANOL: Alcohol, Ethyl (B): <15 mg/dL (ref <15)

## 2023-12-01 MED ORDER — DIPHENHYDRAMINE HCL 50 MG/ML IJ SOLN
INTRAMUSCULAR | Status: AC
  Administered 2023-12-01: 50 mg
  Filled 2023-12-01: qty 1

## 2023-12-01 MED ORDER — IOHEXOL 350 MG/ML SOLN
100.0000 mL | Freq: Once | INTRAVENOUS | Status: AC | PRN
  Administered 2023-12-01: 100 mL via INTRAVENOUS

## 2023-12-01 NOTE — Consult Note (Signed)
 [facility] Consult Services   Patient Name:   Andrew Love, Andrew Love Date of Birth:   1965/09/25 Identification Number:   MRN - [id number] Date of Service:   12/01/2023 23:12:21  Diagnosis:       R20.2 - Paresthesia of skin  Impression:      Patient is a 58 year old gentleman history of hyperlipidemia presents for evaluation of numbness.    Assessment  Left sided Numbness  Bilateral Facial Numbness    Patient presents for evaluation of bilateral facial numbness and left-sided numbness in the setting of longstanding vertigo. CT head and CTA head and neck are nonrevealing. No thrombolytic therapy secondary to low NIH stroke scale. Recommend MRI brain.  Our recommendations are outlined below.  Recommendations:        Stroke/Telemetry Floor       Neuro Checks (Q2)       Bedside Swallow Eval       DVT Prophylaxis       IV Fluids, Normal Saline       Head of Bed 30 Degrees       Euglycemia and Avoid Hyperthermia (PRN Acetaminophen )       Initiate or continue Aspirin  81 MG daily       Antihypertensives PRN if Blood pressure is greater than 220/120 or there is a concern for End organ damage/contraindications for permissive HTN. If blood pressure is greater than 220/120 give labetalol PO or IV or Vasotec IV with a goal of 15% reduction in BP during the first 24 hours.  Sign Out:       Discussed with Emergency Department Provider    ------------------------------------------------------------------------------  Advanced Imaging: CTA Head and Neck Completed.  LVO:No  Patient is not a candidate for NIR   Metrics: Last Known Well: 12/01/2023 21:00:23 Dispatch Time: 12/01/2023 23:12:21 Arrival Time: 12/01/2023 22:59:34 Initial Response Time: 12/01/2023 23:21:34 Symptoms: numbness. Initial patient interaction: 12/01/2023 23:44:57 NIHSS Assessment Completed: 12/01/2023 23:55:57 Patient is not a candidate for Thrombolytic. Thrombolytic Medical  Decision: 12/01/2023 23:56:00 Patient was not deemed candidate for Thrombolytic because of following reasons: Stroke severity too mild (non-disabling) .  CT Head: CT head unremarkable for acute infarction or hemorrhage per Radiology: ------------------------------  Primary Provider Notified of Diagnostic Impression and Management Plan on: 12/01/2023 23:41:08    ------------------------------------------------------------------------------  History of Present Illness: Patient is a 58 year old Male.  Patient was brought by private transportation with symptoms of numbness. Patient is a 58 year old gentleman history of hyperlipidemia presents for evaluation of numbness. Patient has been having vertigo for the last 7 days. Today at dinner around 9 PM he developed bilateral facial numbness. Patient presents hospital for further evaluation neurology is consulted.    Past Medical History:      Hyperlipidemia  Medications:  No Anticoagulant use  No Antiplatelet use Reviewed EMR for current medications  Allergies:  Reviewed  Social History: Smoking: No Alcohol Use: No Drug Use: No  Family History:  There is no family history of premature cerebrovascular disease pertinent to this consultation  ROS : 14 Points Review of Systems was performed and was negative except mentioned in HPI.  Past Surgical History: There Is No Surgical History Contributory To Today's Visit     Examination: BP(159/94), Pulse(79), 1A: Level of Consciousness - Alert; keenly responsive + 0 1B: Ask Month and Age - Both Questions Right + 0 1C: Blink Eyes & Squeeze Hands - Performs Both Tasks + 0 2: Test Horizontal Extraocular Movements - Normal + 0 3: Test Visual  Fields - No Visual Loss + 0 4: Test Facial Palsy (Use Grimace if Obtunded) - Normal symmetry + 0 5A: Test Left Arm Motor Drift - No Drift for 10 Seconds + 0 5B: Test Right Arm Motor Drift - No Drift for 10 Seconds + 0 6A: Test Left Leg Motor  Drift - No Drift for 5 Seconds + 0 6B: Test Right Leg Motor Drift - No Drift for 5 Seconds + 0 7: Test Limb Ataxia (FNF/Heel-Shin) - No Ataxia + 0 8: Test Sensation - Mild-Moderate Loss: Less Sharp/More Dull + 1 9: Test Language/Aphasia - Normal; No aphasia + 0 10: Test Dysarthria - Normal + 0 11: Test Extinction/Inattention - No abnormality + 0  NIHSS Score: 1  NIHSS Free Text : Left sided numbness  Pre-Morbid Modified Rankin Scale: 0 Points = No symptoms at all  Spoke with : Dr. Gennaro I reviewed the available imaging via Rapid and initiated discussion with the primary provider  This consult was conducted in real time using interactive audio and video technology. Patient was informed of the technology being used for this visit and agreed to proceed. Patient located in hospital and provider located at home/office setting.   Patient is being evaluated for possible acute neurologic impairment and high probability of imminent or life-threatening deterioration. I spent total of 40 minutes providing care to this patient, including time for face to face visit via telemedicine, review of medical records, imaging studies and discussion of findings with providers, the patient and/or family.   Dr Marzetta Lemmings   [facility] For Inpatient follow-up with [facility] physician please call [facility] at [phone]. As we are not an outpatient service for any post hospital discharge needs please contact the hospital for assistance. If you have any questions for the [facility] physicians or need to reconsult for clinical or diagnostic changes please contact us  via [facility] at [phone].

## 2023-12-01 NOTE — ED Notes (Signed)
Tele stroke button pushed.

## 2023-12-01 NOTE — ED Triage Notes (Signed)
 Pt stated that he began having blurred vision a hour ago. Pt stated taht it is accompanied by a headache, lower facial numbness and high blood pressure.

## 2023-12-02 MED ORDER — KETOROLAC TROMETHAMINE 15 MG/ML IJ SOLN
15.0000 mg | Freq: Once | INTRAMUSCULAR | Status: AC
  Administered 2023-12-02: 15 mg via INTRAVENOUS
  Filled 2023-12-02: qty 1

## 2023-12-02 MED ORDER — ONDANSETRON HCL 4 MG PO TABS: 4.0000 mg | ORAL_TABLET | Freq: Three times a day (TID) | ORAL | Status: AC | PRN

## 2023-12-02 MED ORDER — DIAZEPAM 5 MG PO TABS: 5.0000 mg | ORAL_TABLET | Freq: Three times a day (TID) | ORAL | 0 refills | Status: AC | PRN

## 2023-12-02 MED ORDER — DIAZEPAM 5 MG/ML IJ SOLN
2.5000 mg | Freq: Once | INTRAMUSCULAR | Status: AC
  Administered 2023-12-02: 2.5 mg via INTRAVENOUS
  Filled 2023-12-02: qty 2

## 2023-12-02 NOTE — ED Provider Notes (Signed)
 [place] EMERGENCY DEPARTMENT AT [facility] Provider Note   CSN: [id number] Arrival date & time: 12/01/23  2248  An emergency department physician performed an initial assessment on this suspected stroke patient at 2309.  Patient presents with: Blurred Vision   Andrew Love is a 58 y.o. male.   58 year old male presents for evaluation of 1 hour of symptoms.  He has had some dizziness off and on for a while and struggles with vertigo but today about 1 hour hour ago he developed some paresthesias all over his face.  Now states that his left arm and leg are numb and he feels off balance as well.  Also admits to some nausea and blurry vision.  Denies any other symptoms or concerns at this time.        Prior to Admission medications   Medication Sig Start Date End Date Taking? Authorizing Provider  acetaminophen  (TYLENOL ) 500 MG tablet Take 500-1,000 mg by mouth every 6 (six) hours as needed for mild pain or fever.   Yes [provider]  cholecalciferol (VITAMIN D3) 25 MCG (1000 UNIT) tablet Take 1,000 Units by mouth at bedtime.   Yes [provider]  diazepam  (VALIUM ) 5 MG tablet Take 1 tablet (5 mg total) by mouth every 8 (eight) hours as needed for up to 4 days (dizziness). 12/02/23 12/06/23 Yes Okema Rollinson L, DO  diphenhydrAMINE  (BENADRYL ) 25 MG tablet Take 25-50 mg by mouth every 6 (six) hours as needed for allergies.   Yes [provider]  EPINEPHrine  (EPIPEN ) 0.3 mg/0.3 mL SOAJ Inject 0.3 mLs (0.3 mg total) into the muscle as needed. Patient taking differently: Inject 0.3 mg into the muscle as needed for anaphylaxis. 12/01/12  Yes Sheran Rogue, MD  famotidine  (PEPCID ) 40 MG tablet Take 40 mg by mouth daily as needed for indigestion or heartburn. 04/18/22  Yes [provider]  gabapentin  (NEURONTIN ) 300 MG capsule Take 600 mg by mouth at bedtime. 01/18/22  Yes [provider]  ibuprofen  (ADVIL ) 200 MG tablet Take 800 mg by  mouth every 8 (eight) hours as needed for moderate pain.   Yes [provider]  ipratropium (ATROVENT) 0.03 % nasal spray Place 1-2 sprays into both nostrils See admin instructions. Spray 1-2 sprays into each nostril two to three times a day for rhintis 01/31/22  Yes [provider]  ketotifen (ZADITOR) 0.025 % ophthalmic solution Place 1 drop into both eyes 2 (two) times daily. 01/18/22  Yes [provider]  levothyroxine  (SYNTHROID , LEVOTHROID) 300 MCG tablet Take 300 mcg by mouth every morning.  03/26/14  Yes [provider]  loratadine  (CLARITIN ) 10 MG tablet Take 10 mg by mouth daily. 07/18/22  Yes [provider]  losartan  (COZAAR ) 100 MG tablet Take 100 mg by mouth daily.   Yes [provider]  meclizine  (ANTIVERT ) 25 MG tablet Take 1 tablet (25 mg total) by mouth 3 (three) times daily as needed for dizziness. 04/09/17  Yes Dean Clarity, MD  methocarbamol  (ROBAXIN ) 500 MG tablet Take 500 mg by mouth 3 (three) times daily. 01/11/23  Yes [provider]  metoCLOPramide  (REGLAN ) 5 MG tablet Take 1 tablet (5 mg total) by mouth 3 (three) times daily before meals. 07/29/22  Yes Tat, Alm, MD  naloxone [facility]) nasal spray 4 mg/0.1 mL Place 1 spray into the nose once. 07/18/21  Yes [provider]  Omega-3 Fatty Acids (FISH OIL) 1000 MG CAPS Take 2,000 mg by mouth daily.   Yes  [provider]  ondansetron  (ZOFRAN ) 4 MG tablet Take 1 tablet (4 mg total) by mouth every 8 (eight) hours as needed for up to 5 days for nausea or vomiting. 12/02/23 12/07/23 Yes Nataly Pacifico L, DO  ondansetron  (ZOFRAN -ODT) 4 MG disintegrating tablet Take 1 tablet (4 mg total) by mouth every 8 (eight) hours as needed for nausea or vomiting. 07/29/22  Yes Tat, Alm, MD  oxyCODONE -acetaminophen  (PERCOCET/ROXICET) 5-325 MG tablet Take 1 tablet by mouth every 8 (eight) hours as needed for moderate pain (pain score 4-6). 11/07/23  Yes [provider]   tamsulosin (FLOMAX) 0.4 MG CAPS capsule Take 1 capsule by mouth daily. 01/18/22  Yes [provider]  tiZANidine (ZANAFLEX) 4 MG tablet Take 4 mg by mouth 3 (three) times daily. 10/11/23  Yes [provider]  azithromycin  (ZITHROMAX ) 500 MG tablet Take 1 tablet (500 mg total) by mouth daily. 07/30/22   Evonnie Alm, MD  buPROPion (WELLBUTRIN SR) 150 MG 12 hr tablet Take 150 mg by mouth See admin instructions. Take 2 tablets by mouth every morning for mood. 04/14/22   [provider]  isosorbide  mononitrate (IMDUR ) 30 MG 24 hr tablet Take 1 tablet (30 mg total) by mouth daily. 10/06/21 10/01/22  Strader, Laymon HERO, PA-C  mirtazapine (REMERON) 15 MG tablet Take 7.5 mg by mouth at bedtime. 07/18/22   [provider]  nitroGLYCERIN  (NITROSTAT ) 0.4 MG SL tablet Place 1 tablet (0.4 mg total) under the tongue every 5 (five) minutes as needed for chest pain. Up to 3 tablets then call 911 or go to the nearest ED. 10/06/21 07/26/22  Johnson, Laymon HERO, PA-C  omeprazole  (PRILOSEC) 40 MG capsule Take 1 capsule (40 mg total) by mouth 2 (two) times daily. Patient taking differently: Take 40 mg by mouth daily. 10/17/21 07/26/22  Cindie Carlin POUR, DO  oxyCODONE  (ROXICODONE ) 5 MG immediate release tablet Take 1 tablet (5 mg total) by mouth every 4 (four) hours as needed for severe pain. 12/10/21   Theadore Ozell HERO, MD  rosuvastatin  (CRESTOR ) 10 MG tablet Take 5 mg by mouth daily. 01/24/22   [provider]  Tetrahydroz-Dextran-PEG-Povid (VISINE ADVANCED RELIEF) 0.05-0.1-1-1 % SOLN Place 1 drop into both eyes daily as needed (dry eyes).    [provider]    Allergies: Dust mite mixed allergen ext [mite (d. farinae)], Other, Iohexol , Almond (diagnostic), Crestor  [rosuvastatin ], Oysters [shellfish allergy], Wheat, and Yeast-derived drug products    Review of Systems  Constitutional:  Negative for chills and fever.  HENT:  Negative for ear pain and sore throat.   Eyes:  Negative  for pain and visual disturbance.  Respiratory:  Negative for cough and shortness of breath.   Cardiovascular:  Negative for chest pain and palpitations.  Gastrointestinal:  Positive for nausea. Negative for abdominal pain and vomiting.  Genitourinary:  Negative for dysuria and hematuria.  Musculoskeletal:  Negative for arthralgias and back pain.  Skin:  Negative for color change and rash.  Neurological:  Positive for dizziness, light-headedness, numbness and headaches. Negative for seizures and syncope.  All other systems reviewed and are negative.   Updated Vital Signs BP 131/82   Pulse 79   Temp 98.1 F (36.7 C) (Oral)   Resp 16   Ht 6' (1.829 m)   Wt 106.1 kg   SpO2 95%   BMI 31.74 kg/m   Physical Exam  (all labs ordered are listed, but only abnormal results are displayed) Labs Reviewed  DIFFERENTIAL - Abnormal; Notable for  the following components:      Result Value   Abs Immature Granulocytes 0.11 (*)    All other components within normal limits  COMPREHENSIVE METABOLIC PANEL WITH GFR - Abnormal; Notable for the following components:   Glucose, Bld 217 (*)    All other components within normal limits  CBG MONITORING, ED - Abnormal; Notable for the following components:   Glucose-Capillary 217 (*)    All other components within normal limits  ETHANOL  PROTIME-INR  APTT  CBC  RAPID URINE DRUG SCREEN, HOSP PERFORMED  I-STAT CHEM 8, ED    EKG: None  Radiology: CT ANGIO HEAD NECK W WO CM W PERF (CODE STROKE) Result Date: 12/02/2023 CLINICAL DATA:  Neuro deficit, acute, stroke suspected EXAM: CT ANGIOGRAPHY HEAD AND NECK CT PERFUSION BRAIN TECHNIQUE: Multidetector CT imaging of the head and neck was performed using the standard protocol during bolus administration of intravenous contrast. Multiplanar CT image reconstructions and MIPs were obtained to evaluate the vascular anatomy. Carotid stenosis measurements (when applicable) are obtained utilizing NASCET criteria,  using the distal internal carotid diameter as the denominator. Multiphase CT imaging of the brain was performed following IV bolus contrast injection. Subsequent parametric perfusion maps were calculated using RAPID software. RADIATION DOSE REDUCTION: This exam was performed according to the departmental dose-optimization program which includes automated exposure control, adjustment of the mA and/or kV according to patient size and/or use of iterative reconstruction technique. CONTRAST:  OMNIPAQUE  IOHEXOL  350 MG/ML SOLN COMPARISON:  Same day CT head. FINDINGS: CTA NECK FINDINGS Aortic arch: Great vessel origins are patent without significant stenosis. Right carotid system: No evidence of dissection, stenosis (50% or greater) or occlusion. Left carotid system: No evidence of dissection, stenosis (50% or greater) or occlusion. Vertebral arteries: Moderate left vertebral artery origin stenosis. Otherwise, no evidence of dissection, stenosis (50% or greater) or occlusion. Skeleton: No acute abnormality on limited assessment. Other neck: No acute abnormality on limited assessment. Upper chest: Visualized lung apices are clear. Review of the MIP images confirms the above findings CTA HEAD FINDINGS Anterior circulation: Bilateral intracranial ICAs, MCAs, and ACAs are patent without proximal hemodynamically significant stenosis. Posterior circulation: Bilateral intradural vertebral arteries, basilar artery and bilateral posterior cerebral arteries are patent without proximal hemodynamically significant stenosis. Venous sinuses: As permitted by contrast timing, patent. Review of the MIP images confirms the above findings CT Brain Perfusion Findings: ASPECTS: 10 CBF (<30%) Volume: 0mL Perfusion (Tmax>6.0s) volume: 0mL Mismatch Volume: 0mL Infarction Location:None identified. IMPRESSION: 1. No emergent large vessel occlusion. 2. Moderate left vertebral artery origin stenosis. 3. No evidence of core infarct or penumbra on  CT perfusion. Electronically Signed   By: Gilmore GORMAN Molt M.D.   On: 12/02/2023 00:04   CT HEAD CODE STROKE WO CONTRAST Result Date: 12/01/2023 CLINICAL DATA:  Code stroke.  Neuro deficit, acute, stroke suspected EXAM: CT HEAD WITHOUT CONTRAST TECHNIQUE: Contiguous axial images were obtained from the base of the skull through the vertex without intravenous contrast. RADIATION DOSE REDUCTION: This exam was performed according to the departmental dose-optimization program which includes automated exposure control, adjustment of the mA and/or kV according to patient size and/or use of iterative reconstruction technique. COMPARISON:  None Available. FINDINGS: Brain: No evidence of acute infarction, hemorrhage, hydrocephalus, extra-axial collection or mass lesion/mass effect. Vascular: No hyperdense vessel. Skull: No acute fracture. Sinuses/Orbits: Clear sinuses.  No acute orbital findings. Other: No mastoid effusions. ASPECTS [facility] Stroke Program Early CT Score) Total score (0-10 with 10 being normal): 10. IMPRESSION:  No evidence of acute intracranial abnormality. ASPECTS is 10. Code stroke imaging results were communicated on 12/01/2023 at 11:31 pm to provider Dr. Lannette via telephone. Electronically Signed   By: Gilmore GORMAN Molt M.D.   On: 12/01/2023 23:32     Procedures   Medications Ordered in the ED  diazepam  (VALIUM ) injection 2.5 mg (has no administration in time range)  ketorolac  (TORADOL ) 15 MG/ML injection 15 mg (has no administration in time range)  iohexol  (OMNIPAQUE ) 350 MG/ML injection 100 mL (100 mLs Intravenous Contrast Given 12/01/23 2329)  diphenhydrAMINE  (BENADRYL ) 50 MG/ML injection (50 mg  Given 12/01/23 2328)                                    Medical Decision Making 58 year old male presents for evaluation of dizziness blurred vision and left-sided weakness and numbness that started about an hour prior to arrival.  He was stroke alerted.  On neuroevaluation history is  inconsistent and he is now having numbness bilaterally.  CTA and CT head and neck are negative.  Neurology recommending MRI but patient is unsure that he wants to do this as he is claustrophobic.  He otherwise appears stable.  The remainder of his workup is negative.  He absolutely does not want to stay for MRI as it is not an open MRI and is aware of the risks and that we cannot rule out small stroke.  I will treat him for vertigo here with Valium  and Zofran  and Toradol  for his headache.  Will give him prescription for Valium  and for Zofran  and I advise very close follow-up with neurology.  A referral was placed in the ER.  Given strict return precautions.  All results and plan discussed with patient and wife at bedside and he is agreeable with plan for discharge.  Problems Addressed: Dizziness: undiagnosed new problem with uncertain prognosis Paresthesias: undiagnosed new problem with uncertain prognosis Weakness: undiagnosed new problem with uncertain prognosis  Amount and/or Complexity of Data Reviewed Labs: ordered. Decision-making details documented in ED Course.    Details: Ordered and reviewed and unremarkable Radiology: ordered and independent interpretation performed. Decision-making details documented in ED Course.    Details: Ordered and discussed with neurology and imaging was reviewed.  There is no acute abnormality on CT head and CTA of the head and neck Discussion of management or test interpretation with external provider(s): On-call provider for teleneurology-I spoke with them and they amend against thrombolytics at this time.  States symptoms are nonfocal at this time.  Recommended MRI and admission  Risk OTC drugs. Prescription drug management. Parenteral controlled substances. Drug therapy requiring intensive monitoring for toxicity. Decision regarding hospitalization.    Final diagnoses:  Dizziness  Paresthesias  Weakness    ED Discharge Orders          Ordered     Ambulatory referral to Neurology       Comments: An appointment is requested in approximately: 1 week   12/02/23 0027    diazepam  (VALIUM ) 5 MG tablet  Every 8 hours PRN        12/02/23 0028    ondansetron  (ZOFRAN ) 4 MG tablet  Every 8 hours PRN        12/02/23 0028               Copeland Lapier L, DO 12/02/23 0003    Samyra Limb L, DO 12/02/23 [id number]

## 2023-12-02 NOTE — ED Notes (Signed)
 Pt states that he hasn't felt normal since about a week ago. Pt states blurred vision started 3 days ago but worsened tonight around 2130.  Pt took their vertigo medications as prescribed Pt states took losarten 100mg  at 2100 Pt states last took oxy 10-325; tizanadine 4mg  at 2200

## 2023-12-02 NOTE — ED Notes (Signed)
 Pt's spouse told this RN that pt is seeing black spots in both eyes EDP made aware

## 2023-12-02 NOTE — Discharge Instructions (Signed)
 Take your Valium  as needed and your Zofran  as needed.  Call urology and follow-up with them.  Return to the ER for any new or worsening symptoms.

## 2023-12-02 NOTE — ED Notes (Addendum)
 Pt only wants to do MRI in an open bed. EDP and Neuro are aware. Decision was made to dc pt and follow up with neurology

## 2023-12-03 LAB — I-STAT CHEM 8, ED
BUN: 18 mg/dL (ref 6–20)
Calcium, Ion: 1.11 mmol/L — ABNORMAL LOW (ref 1.15–1.40)
Chloride: 100 mmol/L (ref 98–111)
Creatinine, Ser: 1.1 mg/dL (ref 0.61–1.24)
Glucose, Bld: 216 mg/dL — ABNORMAL HIGH (ref 70–99)
HCT: 47 % (ref 39.0–52.0)
Hemoglobin: 16 g/dL (ref 13.0–17.0)
Potassium: 4 mmol/L (ref 3.5–5.1)
Sodium: 138 mmol/L (ref 135–145)
TCO2: 24 mmol/L (ref 22–32)

## 2023-12-07 ENCOUNTER — Encounter: Payer: Self-pay | Admitting: Neurology

## 2023-12-07 ENCOUNTER — Ambulatory Visit (INDEPENDENT_AMBULATORY_CARE_PROVIDER_SITE_OTHER): Admitting: Neurology

## 2023-12-07 VITALS — BP 121/77 | HR 82 | Ht 72.0 in | Wt 228.5 lb

## 2023-12-07 DIAGNOSIS — M67431 Ganglion, right wrist: Secondary | ICD-10-CM | POA: Insufficient documentation

## 2023-12-07 DIAGNOSIS — R42 Dizziness and giddiness: Secondary | ICD-10-CM

## 2023-12-07 DIAGNOSIS — K219 Gastro-esophageal reflux disease without esophagitis: Secondary | ICD-10-CM | POA: Insufficient documentation

## 2023-12-07 DIAGNOSIS — M542 Cervicalgia: Secondary | ICD-10-CM

## 2023-12-07 DIAGNOSIS — J0100 Acute maxillary sinusitis, unspecified: Secondary | ICD-10-CM | POA: Insufficient documentation

## 2023-12-07 DIAGNOSIS — H6693 Otitis media, unspecified, bilateral: Secondary | ICD-10-CM | POA: Insufficient documentation

## 2023-12-07 DIAGNOSIS — E1140 Type 2 diabetes mellitus with diabetic neuropathy, unspecified: Secondary | ICD-10-CM | POA: Insufficient documentation

## 2023-12-07 DIAGNOSIS — M25539 Pain in unspecified wrist: Secondary | ICD-10-CM | POA: Insufficient documentation

## 2023-12-07 DIAGNOSIS — I2589 Other forms of chronic ischemic heart disease: Secondary | ICD-10-CM | POA: Insufficient documentation

## 2023-12-07 DIAGNOSIS — F411 Generalized anxiety disorder: Secondary | ICD-10-CM | POA: Insufficient documentation

## 2023-12-07 DIAGNOSIS — A059 Bacterial foodborne intoxication, unspecified: Secondary | ICD-10-CM | POA: Insufficient documentation

## 2023-12-07 DIAGNOSIS — Z87892 Personal history of anaphylaxis: Secondary | ICD-10-CM | POA: Insufficient documentation

## 2023-12-07 DIAGNOSIS — M7712 Lateral epicondylitis, left elbow: Secondary | ICD-10-CM | POA: Insufficient documentation

## 2023-12-07 DIAGNOSIS — E890 Postprocedural hypothyroidism: Secondary | ICD-10-CM | POA: Insufficient documentation

## 2023-12-07 DIAGNOSIS — J310 Chronic rhinitis: Secondary | ICD-10-CM | POA: Insufficient documentation

## 2023-12-07 DIAGNOSIS — Z760 Encounter for issue of repeat prescription: Secondary | ICD-10-CM | POA: Insufficient documentation

## 2023-12-07 DIAGNOSIS — M47812 Spondylosis without myelopathy or radiculopathy, cervical region: Secondary | ICD-10-CM | POA: Insufficient documentation

## 2023-12-07 DIAGNOSIS — R222 Localized swelling, mass and lump, trunk: Secondary | ICD-10-CM | POA: Insufficient documentation

## 2023-12-07 DIAGNOSIS — F419 Anxiety disorder, unspecified: Secondary | ICD-10-CM | POA: Insufficient documentation

## 2023-12-07 DIAGNOSIS — R7303 Prediabetes: Secondary | ICD-10-CM | POA: Insufficient documentation

## 2023-12-07 DIAGNOSIS — M2559 Pain in other specified joint: Secondary | ICD-10-CM | POA: Insufficient documentation

## 2023-12-07 MED ORDER — NURTEC 75 MG PO TBDP: 75.0000 mg | ORAL_TABLET | ORAL | 11 refills | Status: DC

## 2023-12-07 MED ORDER — ONDANSETRON 4 MG PO TBDP: 4.0000 mg | ORAL_TABLET | Freq: Three times a day (TID) | ORAL | 6 refills | Status: AC | PRN

## 2023-12-07 NOTE — Progress Notes (Signed)
 Chief Complaint  Patient presents with   [phone]    Pt is here with his Wife. Pt states that he is dizzy right now and has some nausea as well, pt has taken a Zofran . Pt states that has a artery in his neck that is narrow. Pt states that he has been throwing up every single evening as well as the dizziness. Pt states that he had an ablation done and since then his symptoms has started. Pt states that he has numbness from the back of his neck descending down to his shoulder blade. Pt states that he is getting headaches everyday since his symptoms started.   cont..    Pt states that his balance isn't stable. Pt states that he is having memory issues as well. Pt's wife states that he has left sided weakness in his left leg. Pt states that everyday he has tingling and numbness in his face. Pt states that during the day inside is head he has tingling. Pt states that he has a swooshing sound in his head.    ASSESSMENT AND PLAN  Andrew Love is a 58 y.o. male   Prolonged dizziness nausea blurry vision, headache since cervical ablation in June 2025  MRI of the brain to rule out brainstem/cerebellum pathology  CT angiogram head and neck showed no acute abnormality, no evidence of core infarct or penumbra  Will try Nurtec as needed with Zofran  for his occipital headache, nausea,  Refer to physical therapy, including vestibular evaluation and treatment  Return in 4 to 6 months  DIAGNOSTIC DATA (LABS, IMAGING, TESTING) - I reviewed patient records, labs, notes, testing and imaging myself where available.   MEDICAL HISTORY:  Andrew Love is a 58 year old male, seen in request by his primary care at the [facility] and the emergency room physician Dr. Gennaro, [place] for evaluation of dizziness, initial evaluation December 07, 2023  History is obtained from the patient and review of electronic medical records. I personally reviewed pertinent available imaging films in PACS.   PMHx of  Prostate  hypertrophy HTN Hypothyrodism CAD OSA Lumbar decompression surgery, chronic low back pain, Hx of hip replacement Cervical decompression surgery,  Hx of bilateral shoulder arthroscopic surgery. Hx of diverticulitis  He served in [employer], is a retired IT sales professional, suffered chronic cervical, lumbar degenerative disease required multiple surgery in the past, also had bilateral shoulder surgery  In June 2025, he received cervical ablation at the [facility], reported during the procedure, he felt overstimulation of few seconds, brain went into a quiver,   Since then, he has constant tension at upper cervical region radiating to the occipital region, frequent nausea, dizziness, lightheaded when standing up, with prolonged standing he felt spinning sensation, blurry vision,  This also correlate with his recent decreased oxycodone  dose from 10 mg every 8 hours to 5 mg every 8 hours  He presented to the emergency room on December 02, 2023, CT head and neck showed no acute abnormality, no large vessel occlusion, moderate left vertebral artery origin stenosis, no evidence of core infarct or penumbra on CT perfusion Laboratory evaluations showed normal or negative CBC, CMP, creatinine of 1.13    PHYSICAL EXAM:   Vitals:   12/07/23 1122 12/07/23 1124 12/07/23 1126  BP: (!) 153/81 122/80 121/77  Pulse: 68 95 82  SpO2: 97% 91% 99%  Weight: 228 lb 8 oz (103.6 kg)    Height: 6' (1.829 m)    Sitting down 128/85, HR 68,  Standing up  119/83, HR 94 Standing up 129/85, HR 92,    Body mass index is 30.99 kg/m.  PHYSICAL EXAMNIATION:  Gen: NAD, conversant, well nourised, well groomed                     Cardiovascular: Regular rate rhythm, no peripheral edema, warm, nontender. Eyes: Conjunctivae clear without exudates or hemorrhage Neck: Supple, no carotid bruits. Pulmonary: Clear to auscultation bilaterally   NEUROLOGICAL EXAM:  MENTAL STATUS: Speech/cognition:  middle-age male, in mild  distress, awake, alert, oriented to history taking and casual conversation CRANIAL NERVES: CN II: Visual fields are full to confrontation. Pupils are round equal and briskly reactive to light. CN III, IV, VI: extraocular movement are normal. No ptosis. CN V: Facial sensation is intact to light touch CN VII: Face is symmetric with normal eye closure  CN VIII: Hearing is normal to causal conversation. CN IX, X: Phonation is normal. CN XI: Head turning and shoulder shrug are intact  MOTOR: There is no pronator drift of out-stretched arms. Muscle bulk and tone are normal. Muscle strength is normal.  REFLEXES: Reflexes are 1 and symmetric at the biceps, triceps, knees, and ankles. Plantar responses are flexor.  SENSORY: Intact to light touch, pinprick and vibratory sensation are intact in fingers and toes.  COORDINATION: There is no trunk or limb dysmetria noted.  GAIT/STANCE: Push-up to get up from seated position, cautious, but fairly steady  REVIEW OF SYSTEMS:  Full 14 system review of systems performed and notable only for as above All other review of systems were negative.   ALLERGIES: Allergies  Allergen Reactions   Dust Mite Mixed Allergen Ext [Mite (D. Farinae)] Anaphylaxis and Hives   Other Hives and Shortness Of Breath    Cockroaches   Iohexol  Hives   Almond (Diagnostic) Hives   Crestor  [Rosuvastatin ]     Muscle/joint pain    Oysters [Shellfish Allergy] Hives   Wheat Hives   Yeast-Derived Drug Products Hives    HOME MEDICATIONS: Current Outpatient Medications  Medication Sig Dispense Refill   acetaminophen  (TYLENOL ) 500 MG tablet Take 500-1,000 mg by mouth every 6 (six) hours as needed for mild pain or fever.     azithromycin  (ZITHROMAX ) 500 MG tablet Take 1 tablet (500 mg total) by mouth daily. 4 tablet 0   buPROPion (WELLBUTRIN SR) 150 MG 12 hr tablet Take 150 mg by mouth See admin instructions. Take 2 tablets by mouth every morning for mood.      cholecalciferol (VITAMIN D3) 25 MCG (1000 UNIT) tablet Take 1,000 Units by mouth at bedtime.     diphenhydrAMINE  (BENADRYL ) 25 MG tablet Take 25-50 mg by mouth every 6 (six) hours as needed for allergies.     famotidine  (PEPCID ) 40 MG tablet Take 40 mg by mouth daily as needed for indigestion or heartburn.     gabapentin  (NEURONTIN ) 300 MG capsule Take 600 mg by mouth at bedtime.     ibuprofen  (ADVIL ) 200 MG tablet Take 800 mg by mouth every 8 (eight) hours as needed for moderate pain.     ipratropium (ATROVENT) 0.03 % nasal spray Place 1-2 sprays into both nostrils See admin instructions. Spray 1-2 sprays into each nostril two to three times a day for rhintis     isosorbide  mononitrate (IMDUR ) 30 MG 24 hr tablet Take 1 tablet (30 mg total) by mouth daily. 90 tablet 3   ketotifen (ZADITOR) 0.025 % ophthalmic solution Place 1 drop into both eyes 2 (two) times daily.  levothyroxine  (SYNTHROID , LEVOTHROID) 300 MCG tablet Take 300 mcg by mouth every morning.      loratadine  (CLARITIN ) 10 MG tablet Take 10 mg by mouth daily.     losartan  (COZAAR ) 100 MG tablet Take 100 mg by mouth daily.     meclizine  (ANTIVERT ) 25 MG tablet Take 1 tablet (25 mg total) by mouth 3 (three) times daily as needed for dizziness. 30 tablet 0   mirtazapine (REMERON) 15 MG tablet Take 7.5 mg by mouth at bedtime.     Omega-3 Fatty Acids (FISH OIL) 1000 MG CAPS Take 2,000 mg by mouth daily.     omeprazole  (PRILOSEC) 40 MG capsule Take 1 capsule (40 mg total) by mouth 2 (two) times daily. 60 capsule 5   ondansetron  (ZOFRAN -ODT) 4 MG disintegrating tablet Take 1 tablet (4 mg total) by mouth every 8 (eight) hours as needed for nausea or vomiting. 20 tablet 0   oxyCODONE -acetaminophen  (PERCOCET/ROXICET) 5-325 MG tablet Take 1 tablet by mouth every 8 (eight) hours as needed for moderate pain (pain score 4-6).     rosuvastatin  (CRESTOR ) 10 MG tablet Take 5 mg by mouth daily.     tamsulosin (FLOMAX) 0.4 MG CAPS capsule Take 1 capsule  by mouth daily.     tiZANidine (ZANAFLEX) 4 MG tablet Take 4 mg by mouth 3 (three) times daily.     EPINEPHrine  (EPIPEN ) 0.3 mg/0.3 mL SOAJ Inject 0.3 mLs (0.3 mg total) into the muscle as needed. (Patient not taking: Reported on 12/07/2023) 1 Device 3   methocarbamol  (ROBAXIN ) 500 MG tablet Take 500 mg by mouth 3 (three) times daily. (Patient not taking: Reported on 12/07/2023)     metoCLOPramide  (REGLAN ) 5 MG tablet Take 1 tablet (5 mg total) by mouth 3 (three) times daily before meals. (Patient not taking: Reported on 12/07/2023) 12 tablet 0   naloxone (NARCAN) nasal spray 4 mg/0.1 mL Place 1 spray into the nose once. (Patient not taking: Reported on 12/07/2023)     nitroGLYCERIN  (NITROSTAT ) 0.4 MG SL tablet Place 1 tablet (0.4 mg total) under the tongue every 5 (five) minutes as needed for chest pain. Up to 3 tablets then call 911 or go to the nearest ED. (Patient not taking: Reported on 12/07/2023) 25 tablet 3   ondansetron  (ZOFRAN ) 4 MG tablet Take 1 tablet (4 mg total) by mouth every 8 (eight) hours as needed for up to 5 days for nausea or vomiting. (Patient not taking: Reported on 12/07/2023) 15 tablet    oxyCODONE  (ROXICODONE ) 5 MG immediate release tablet Take 1 tablet (5 mg total) by mouth every 4 (four) hours as needed for severe pain. (Patient not taking: Reported on 12/07/2023) 8 tablet 0   Tetrahydroz-Dextran-PEG-Povid (VISINE ADVANCED RELIEF) 0.05-0.1-1-1 % SOLN Place 1 drop into both eyes daily as needed (dry eyes). (Patient not taking: Reported on 12/07/2023)     Current Facility-Administered Medications  Medication Dose Route Frequency Provider Last Rate Last Admin   omalizumab  (XOLAIR ) injection 300 mg  300 mg Subcutaneous Q28 days Iva Marty Saltness, MD   300 mg at 07/09/19 1229    PAST MEDICAL HISTORY: Past Medical History:  Diagnosis Date   Arthritis    CAD (coronary artery disease)    Chronic back pain    Chronic left hip pain    Colitis    Per medical history from dated 06/13/10.    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   Sigmoid colon ulcer    Rectal polyps   Sleep apnea with use of continuous positive airway pressure (CPAP)    Urticaria     PAST SURGICAL HISTORY: Past Surgical History:  Procedure Laterality Date   ABLATION Right 10/2023   APPENDECTOMY     8th grade   BACK SURGERY     BIOPSY  10/17/2021   Procedure: BIOPSY;  Surgeon: Cindie Carlin POUR, DO;  Location: [facility];  Service: Endoscopy;;   CARDIAC CATHETERIZATION  2009   CARDIAC CATHETERIZATION     Per medical history from dated 06/13/10.   CERVICAL SPINE SURGERY     C4, C5, C6 spinal fusion   CHOLECYSTECTOMY N/A 08/31/2015   Procedure: LAPAROSCOPIC CHOLECYSTECTOMY WITH INTRAOPERATIVE CHOLANGIOGRAM;  Surgeon: Morene Olives, MD;  Location: [facility];  Service: General;  Laterality: N/A;   COLONOSCOPY  07/2008   Colitis,NSAID v. Ischemia,malrotation of the gut,Diverticulosis(L),hyperplastic   COLONOSCOPY N/A 12/13/2012   Dr. Harvey: Normal TI, mild sigmoid diverticulosis, hemorrhoids, 2 polyps (tubular adenoma). Random colon bx negative. Next TCS 12/2022 with Fentanyl /phenergan    COLONOSCOPY WITH PROPOFOL  N/A 10/17/2021   Procedure: COLONOSCOPY WITH PROPOFOL ;  Surgeon: Cindie Carlin POUR, DO;  Location: [facility];  Service: Endoscopy;  Laterality: N/A;  10:15am   ESOPHAGOGASTRODUODENOSCOPY N/A 04/16/2014   RMR: Erosive reflux esophagitis. Non critical Schzki's ring not manipulated. Small hiatal hernia. Abnormal gastirc mucosa of doubtful signigicance status post biopsy. I suspect trivial upper GI bleed. Recent abdominal pain presumably secondary to a protracted bout of diverticulitis. CT  scan November 23 revealed improvemetn without complication. He finished his antibiotics 2 days age. Left sided ab   ESOPHAGOGASTRODUODENOSCOPY (EGD) WITH PROPOFOL  N/A 10/17/2021   Procedure: ESOPHAGOGASTRODUODENOSCOPY (EGD) WITH PROPOFOL ;  Surgeon: Cindie Carlin POUR, DO;  Location: [facility];  Service: Endoscopy;  Laterality: N/A;   JOINT REPLACEMENT     LAPAROSCOPIC SIGMOID COLECTOMY  2012   Dr. Elon Pacini: recurrent sigmoid diverticulitis   LAPAROSCOPIC SIGMOID COLECTOMY  2012   recurernt sigmoid diverticulitis, Dr. Pacini    LEFT HEART CATH AND CORONARY ANGIOGRAPHY N/A 06/03/2021   Procedure: LEFT HEART CATH AND CORONARY ANGIOGRAPHY;  Surgeon: Wendel Lurena POUR, MD;  Location: [facility];  Service: Cardiovascular;  Laterality: N/A;   POLYPECTOMY  10/17/2021   Procedure: POLYPECTOMY;  Surgeon: Cindie Carlin POUR, DO;  Location: [facility];  Service: Endoscopy;;   ROTATOR CUFF REPAIR     Left - per medical history form dated 06/13/10.   SHOULDER SURGERY     Left   THYROIDECTOMY     Per medical history form dated 06/13/10.   TOTAL HIP ARTHROPLASTY  07/04/2011   Procedure: TOTAL HIP ARTHROPLASTY;  Surgeon: Fonda SHAUNNA Olmsted, MD;  Location: [facility];  Service: Orthopedics;  Laterality: Right;   TOTAL HIP ARTHROPLASTY Left 07/22/2019   Procedure: TOTAL HIP ARTHROPLASTY;  Surgeon: Olmsted Fonda, MD;  Location: [facility];  Service: Orthopedics;  Laterality: Left;    FAMILY HISTORY: Family History  Problem Relation Age of Onset   Cancer Mother        breast cancer - per medical history form dated 06/13/10.   Diverticulitis Mother    Hypertension Mother    Cancer  Father        skin - per medical history form dated 06/13/10.   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   Married   Social Drivers of Corporate investment banker Strain: Low Risk  (07/19/2021)   Received from [facility]   Overall Financial Resource Strain (CARDIA)    Difficulty of Paying Living Expenses: Not hard at all  Food Insecurity: No Food Insecurity (07/26/2022)   Hunger Vital Sign    Worried About Running Out of Food in the Last Year: Never true    Ran Out of Food in the Last Year: Never true  Transportation Needs: No Transportation Needs (07/26/2022)   PRAPARE - Administrator, Civil Service (Medical): No    Lack of Transportation (Non-Medical): No  Physical Activity: Insufficiently Active (07/19/2021)   Received from [facility]   Exercise Vital Sign    On average, how many days per week do you engage in moderate to strenuous exercise (like a brisk walk)?: 3 days    On average, how many minutes do you engage in exercise at this level?: 30 min  Stress: Stress Concern Present (07/25/2021)   Received from [facility] - Occupational Stress Questionnaire    Feeling of Stress : Very much  Social Connections: Unknown (07/23/2022)   Received from [facility]    Social Network    Social Network: Not on file  Intimate Partner Violence: Not At Risk (07/26/2022)   Humiliation, Afraid, Rape, and Kick questionnaire    Fear of Current or Ex-Partner: No    Emotionally Abused: No    Physically Abused: No    Sexually Abused: No      Modena Callander, M.D. Ph.D.  [facility] [address], [address] [place], [place] [address] Ph: [phone] Fax: [phone]  CC:  Gennaro Duwaine CROME, DO [address] [place],  [place] [address]  Administration, [employer]

## 2023-12-08 ENCOUNTER — Encounter (HOSPITAL_COMMUNITY): Payer: Self-pay | Admitting: Emergency Medicine

## 2023-12-08 ENCOUNTER — Inpatient Hospital Stay (HOSPITAL_COMMUNITY)
Admission: EM | Admit: 2023-12-08 | Discharge: 2023-12-09 | DRG: 103 | Disposition: A | Discharge status: Home / Self Care | Attending: Family Medicine | Admitting: Family Medicine

## 2023-12-08 ENCOUNTER — Other Ambulatory Visit: Payer: Self-pay

## 2023-12-08 ENCOUNTER — Inpatient Hospital Stay (HOSPITAL_COMMUNITY)

## 2023-12-08 DIAGNOSIS — G4486 Cervicogenic headache: Secondary | ICD-10-CM | POA: Diagnosis not present

## 2023-12-08 DIAGNOSIS — E890 Postprocedural hypothyroidism: Secondary | ICD-10-CM | POA: Diagnosis present

## 2023-12-08 DIAGNOSIS — G43109 Migraine with aura, not intractable, without status migrainosus: Principal | ICD-10-CM | POA: Diagnosis present

## 2023-12-08 DIAGNOSIS — Z803 Family history of malignant neoplasm of breast: Secondary | ICD-10-CM

## 2023-12-08 DIAGNOSIS — R112 Nausea with vomiting, unspecified: Secondary | ICD-10-CM | POA: Diagnosis present

## 2023-12-08 DIAGNOSIS — Z79890 Hormone replacement therapy (postmenopausal): Secondary | ICD-10-CM

## 2023-12-08 DIAGNOSIS — E039 Hypothyroidism, unspecified: Secondary | ICD-10-CM | POA: Diagnosis present

## 2023-12-08 DIAGNOSIS — Z808 Family history of malignant neoplasm of other organs or systems: Secondary | ICD-10-CM

## 2023-12-08 DIAGNOSIS — Z91041 Radiographic dye allergy status: Secondary | ICD-10-CM

## 2023-12-08 DIAGNOSIS — T43595A Adverse effect of other antipsychotics and neuroleptics, initial encounter: Secondary | ICD-10-CM | POA: Diagnosis present

## 2023-12-08 DIAGNOSIS — Z8673 Personal history of transient ischemic attack (TIA), and cerebral infarction without residual deficits: Secondary | ICD-10-CM | POA: Diagnosis not present

## 2023-12-08 DIAGNOSIS — I2510 Atherosclerotic heart disease of native coronary artery without angina pectoris: Secondary | ICD-10-CM | POA: Diagnosis present

## 2023-12-08 DIAGNOSIS — E669 Obesity, unspecified: Secondary | ICD-10-CM | POA: Diagnosis present

## 2023-12-08 DIAGNOSIS — Z8719 Personal history of other diseases of the digestive system: Secondary | ICD-10-CM

## 2023-12-08 DIAGNOSIS — F411 Generalized anxiety disorder: Secondary | ICD-10-CM | POA: Diagnosis present

## 2023-12-08 DIAGNOSIS — M47812 Spondylosis without myelopathy or radiculopathy, cervical region: Secondary | ICD-10-CM | POA: Diagnosis present

## 2023-12-08 DIAGNOSIS — R1110 Vomiting, unspecified: Secondary | ICD-10-CM | POA: Diagnosis present

## 2023-12-08 DIAGNOSIS — T424X5A Adverse effect of benzodiazepines, initial encounter: Secondary | ICD-10-CM | POA: Diagnosis present

## 2023-12-08 DIAGNOSIS — Z8249 Family history of ischemic heart disease and other diseases of the circulatory system: Secondary | ICD-10-CM

## 2023-12-08 DIAGNOSIS — Z9049 Acquired absence of other specified parts of digestive tract: Secondary | ICD-10-CM

## 2023-12-08 DIAGNOSIS — Z981 Arthrodesis status: Secondary | ICD-10-CM

## 2023-12-08 DIAGNOSIS — D72829 Elevated white blood cell count, unspecified: Secondary | ICD-10-CM | POA: Diagnosis present

## 2023-12-08 DIAGNOSIS — R110 Nausea: Secondary | ICD-10-CM | POA: Diagnosis present

## 2023-12-08 DIAGNOSIS — E1140 Type 2 diabetes mellitus with diabetic neuropathy, unspecified: Secondary | ICD-10-CM | POA: Diagnosis present

## 2023-12-08 DIAGNOSIS — Z6831 Body mass index (BMI) 31.0-31.9, adult: Secondary | ICD-10-CM

## 2023-12-08 DIAGNOSIS — F40240 Claustrophobia: Secondary | ICD-10-CM | POA: Diagnosis present

## 2023-12-08 DIAGNOSIS — Z96642 Presence of left artificial hip joint: Secondary | ICD-10-CM | POA: Diagnosis present

## 2023-12-08 DIAGNOSIS — I25118 Atherosclerotic heart disease of native coronary artery with other forms of angina pectoris: Secondary | ICD-10-CM | POA: Diagnosis not present

## 2023-12-08 DIAGNOSIS — Z888 Allergy status to other drugs, medicaments and biological substances status: Secondary | ICD-10-CM

## 2023-12-08 DIAGNOSIS — Z87891 Personal history of nicotine dependence: Secondary | ICD-10-CM

## 2023-12-08 DIAGNOSIS — H814 Vertigo of central origin: Secondary | ICD-10-CM | POA: Diagnosis present

## 2023-12-08 DIAGNOSIS — G4733 Obstructive sleep apnea (adult) (pediatric): Secondary | ICD-10-CM | POA: Diagnosis present

## 2023-12-08 DIAGNOSIS — I10 Essential (primary) hypertension: Secondary | ICD-10-CM | POA: Diagnosis present

## 2023-12-08 DIAGNOSIS — E66811 Obesity, class 1: Secondary | ICD-10-CM | POA: Diagnosis present

## 2023-12-08 DIAGNOSIS — M51369 Other intervertebral disc degeneration, lumbar region without mention of lumbar back pain or lower extremity pain: Secondary | ICD-10-CM | POA: Diagnosis present

## 2023-12-08 DIAGNOSIS — N400 Enlarged prostate without lower urinary tract symptoms: Secondary | ICD-10-CM | POA: Diagnosis present

## 2023-12-08 DIAGNOSIS — E1141 Type 2 diabetes mellitus with diabetic mononeuropathy: Secondary | ICD-10-CM | POA: Diagnosis present

## 2023-12-08 DIAGNOSIS — Z79899 Other long term (current) drug therapy: Secondary | ICD-10-CM

## 2023-12-08 DIAGNOSIS — E785 Hyperlipidemia, unspecified: Secondary | ICD-10-CM | POA: Diagnosis present

## 2023-12-08 DIAGNOSIS — R42 Dizziness and giddiness: Principal | ICD-10-CM

## 2023-12-08 DIAGNOSIS — K219 Gastro-esophageal reflux disease without esophagitis: Secondary | ICD-10-CM | POA: Diagnosis present

## 2023-12-08 DIAGNOSIS — I952 Hypotension due to drugs: Secondary | ICD-10-CM | POA: Diagnosis present

## 2023-12-08 DIAGNOSIS — Z743 Need for continuous supervision: Secondary | ICD-10-CM | POA: Diagnosis not present

## 2023-12-08 DIAGNOSIS — M5030 Other cervical disc degeneration, unspecified cervical region: Secondary | ICD-10-CM | POA: Diagnosis present

## 2023-12-08 LAB — COMPREHENSIVE METABOLIC PANEL WITH GFR
ALT: 50 U/L — ABNORMAL HIGH (ref 0–44)
AST: 27 U/L (ref 15–41)
Albumin: 4 g/dL (ref 3.5–5.0)
Alkaline Phosphatase: 81 U/L (ref 38–126)
Anion gap: 14 (ref 5–15)
BUN: 15 mg/dL (ref 6–20)
CO2: 23 mmol/L (ref 22–32)
Calcium: 9.2 mg/dL (ref 8.9–10.3)
Chloride: 95 mmol/L — ABNORMAL LOW (ref 98–111)
Creatinine, Ser: 1.22 mg/dL (ref 0.61–1.24)
GFR, Estimated: >60 mL/min (ref >60)
Glucose, Bld: 318 mg/dL — ABNORMAL HIGH (ref 70–99)
Potassium: 3.8 mmol/L (ref 3.5–5.1)
Sodium: 132 mmol/L — ABNORMAL LOW (ref 135–145)
Total Bilirubin: 0.8 mg/dL (ref 0.0–1.2)
Total Protein: 7.3 g/dL (ref 6.5–8.1)

## 2023-12-08 LAB — CBC WITH DIFFERENTIAL/PLATELET
Abs Immature Granulocytes: 0.09 K/uL — ABNORMAL HIGH (ref 0.00–0.07)
Basophils Absolute: 0.1 K/uL (ref 0.0–0.1)
Basophils Relative: 1 %
Eosinophils Absolute: 0.2 K/uL (ref 0.0–0.5)
Eosinophils Relative: 2 %
HCT: 44.5 % (ref 39.0–52.0)
Hemoglobin: 16.2 g/dL (ref 13.0–17.0)
Immature Granulocytes: 1 %
Lymphocytes Relative: 24 %
Lymphs Abs: 3 K/uL (ref 0.7–4.0)
MCH: 32.4 pg (ref 26.0–34.0)
MCHC: 36.4 g/dL — ABNORMAL HIGH (ref 30.0–36.0)
MCV: 89 fL (ref 80.0–100.0)
Monocytes Absolute: 0.9 K/uL (ref 0.1–1.0)
Monocytes Relative: 7 %
Neutro Abs: 8 K/uL — ABNORMAL HIGH (ref 1.7–7.7)
Neutrophils Relative %: 65 %
Platelets: 247 K/uL (ref 150–400)
RBC: 5 MIL/uL (ref 4.22–5.81)
RDW: 12.3 % (ref 11.5–15.5)
WBC: 12.3 K/uL — ABNORMAL HIGH (ref 4.0–10.5)
nRBC: 0 % (ref 0.0–0.2)

## 2023-12-08 LAB — CBC
HCT: 41.5 % (ref 39.0–52.0)
Hemoglobin: 14.5 g/dL (ref 13.0–17.0)
MCH: 31.9 pg (ref 26.0–34.0)
MCHC: 34.9 g/dL (ref 30.0–36.0)
MCV: 91.4 fL (ref 80.0–100.0)
Platelets: 176 K/uL (ref 150–400)
RBC: 4.54 MIL/uL (ref 4.22–5.81)
RDW: 12.5 % (ref 11.5–15.5)
WBC: 8.5 K/uL (ref 4.0–10.5)
nRBC: 0 % (ref 0.0–0.2)

## 2023-12-08 LAB — GLUCOSE, CAPILLARY
Glucose-Capillary: 193 mg/dL — ABNORMAL HIGH (ref 70–99)
Glucose-Capillary: 147 mg/dL — ABNORMAL HIGH (ref 70–99)
Glucose-Capillary: 184 mg/dL — ABNORMAL HIGH (ref 70–99)

## 2023-12-08 LAB — HIV ANTIBODY (ROUTINE TESTING W REFLEX): HIV Screen 4th Generation wRfx: NONREACTIVE

## 2023-12-08 LAB — CREATININE, SERUM
Creatinine, Ser: 0.98 mg/dL (ref 0.61–1.24)
GFR, Estimated: >60 mL/min (ref >60)

## 2023-12-08 MED ORDER — MIRTAZAPINE 15 MG PO TABS
7.5000 mg | ORAL_TABLET | Freq: Every day | ORAL | Status: DC
  Administered 2023-12-08: 7.5 mg via ORAL
  Filled 2023-12-08: qty 1

## 2023-12-08 MED ORDER — DIPHENHYDRAMINE HCL 50 MG/ML IJ SOLN
25.0000 mg | Freq: Once | INTRAMUSCULAR | Status: AC
  Administered 2023-12-08: 25 mg via INTRAVENOUS
  Filled 2023-12-08: qty 1

## 2023-12-08 MED ORDER — LORAZEPAM 2 MG/ML IJ SOLN: 1.0000 mg | Freq: Once | INTRAMUSCULAR | Status: DC | PRN

## 2023-12-08 MED ORDER — DROPERIDOL 2.5 MG/ML IJ SOLN
1.2500 mg | Freq: Once | INTRAMUSCULAR | Status: AC
  Administered 2023-12-08: 1.25 mg via INTRAVENOUS
  Filled 2023-12-08: qty 2

## 2023-12-08 MED ORDER — FLUMAZENIL 0.5 MG/5ML IV SOLN
0.2000 mg | Freq: Once | INTRAVENOUS | Status: AC
  Administered 2023-12-08: 0.2 mg via INTRAVENOUS
  Filled 2023-12-08: qty 5

## 2023-12-08 MED ORDER — BISACODYL 5 MG PO TBEC
5.0000 mg | DELAYED_RELEASE_TABLET | Freq: Every day | ORAL | Status: DC | PRN
Start: 2023-12-08 — End: 2023-12-09

## 2023-12-08 MED ORDER — SODIUM CHLORIDE 0.9 % IV SOLN: INTRAVENOUS | Status: AC

## 2023-12-08 MED ORDER — OLOPATADINE HCL 0.1 % OP SOLN
1.0000 [drp] | Freq: Two times a day (BID) | OPHTHALMIC | Status: DC | PRN
  Filled 2023-12-08: qty 5

## 2023-12-08 MED ORDER — ACETAMINOPHEN 650 MG RE SUPP: 650.0000 mg | Freq: Four times a day (QID) | RECTAL | Status: DC | PRN

## 2023-12-08 MED ORDER — PANTOPRAZOLE SODIUM 40 MG PO TBEC
40.0000 mg | DELAYED_RELEASE_TABLET | Freq: Every day | ORAL | Status: DC
  Administered 2023-12-08 – 2023-12-09 (×2): 40 mg via ORAL
  Filled 2023-12-08 (×2): qty 1

## 2023-12-08 MED ORDER — METHOCARBAMOL 500 MG PO TABS
500.0000 mg | ORAL_TABLET | Freq: Three times a day (TID) | ORAL | Status: DC | PRN
  Administered 2023-12-08 – 2023-12-09 (×2): 500 mg via ORAL
  Filled 2023-12-08 (×2): qty 1

## 2023-12-08 MED ORDER — INSULIN ASPART 100 UNIT/ML IJ SOLN
0.0000 [IU] | Freq: Three times a day (TID) | INTRAMUSCULAR | Status: DC
  Administered 2023-12-08: 4 [IU] via SUBCUTANEOUS
  Administered 2023-12-08: 3 [IU] via SUBCUTANEOUS
  Administered 2023-12-09: 7 [IU] via SUBCUTANEOUS
  Administered 2023-12-09: 4 [IU] via SUBCUTANEOUS

## 2023-12-08 MED ORDER — BUPROPION HCL ER (SR) 150 MG PO TB12
300.0000 mg | ORAL_TABLET | Freq: Every day | ORAL | Status: DC
  Administered 2023-12-08 – 2023-12-09 (×2): 300 mg via ORAL
  Filled 2023-12-08 (×2): qty 2

## 2023-12-08 MED ORDER — PROCHLORPERAZINE EDISYLATE 10 MG/2ML IJ SOLN: 10.0000 mg | INTRAMUSCULAR | Status: DC | PRN

## 2023-12-08 MED ORDER — GABAPENTIN 300 MG PO CAPS: 600.0000 mg | ORAL_CAPSULE | Freq: Every day | ORAL | Status: DC

## 2023-12-08 MED ORDER — MIDAZOLAM HCL 2 MG/2ML IJ SOLN
2.0000 mg | Freq: Once | INTRAMUSCULAR | Status: AC
  Administered 2023-12-08: 2 mg via INTRAVENOUS
  Filled 2023-12-08: qty 2

## 2023-12-08 MED ORDER — INSULIN ASPART 100 UNIT/ML IJ SOLN
5.0000 [IU] | Freq: Three times a day (TID) | INTRAMUSCULAR | Status: DC
  Administered 2023-12-08 – 2023-12-09 (×4): 5 [IU] via SUBCUTANEOUS

## 2023-12-08 MED ORDER — ONDANSETRON HCL 4 MG/2ML IJ SOLN
4.0000 mg | Freq: Four times a day (QID) | INTRAMUSCULAR | Status: DC | PRN
  Administered 2023-12-08: 4 mg via INTRAVENOUS
  Filled 2023-12-08: qty 2

## 2023-12-08 MED ORDER — LORAZEPAM 2 MG/ML IJ SOLN: 1.0000 mg | Freq: Once | INTRAMUSCULAR | Status: DC

## 2023-12-08 MED ORDER — LORATADINE 10 MG PO TABS: 10.0000 mg | ORAL_TABLET | Freq: Every day | ORAL | Status: DC | PRN

## 2023-12-08 MED ORDER — FENTANYL CITRATE (PF) 100 MCG/2ML IJ SOLN: 25.0000 ug | INTRAMUSCULAR | Status: DC | PRN

## 2023-12-08 MED ORDER — PROCHLORPERAZINE EDISYLATE 10 MG/2ML IJ SOLN
10.0000 mg | Freq: Once | INTRAMUSCULAR | Status: AC
  Administered 2023-12-08: 10 mg via INTRAVENOUS
  Filled 2023-12-08: qty 2

## 2023-12-08 MED ORDER — LACTATED RINGERS IV BOLUS
1000.0000 mL | Freq: Once | INTRAVENOUS | Status: AC
  Administered 2023-12-08: 1000 mL via INTRAVENOUS

## 2023-12-08 MED ORDER — LEVOTHYROXINE SODIUM 100 MCG PO TABS
300.0000 ug | ORAL_TABLET | Freq: Every morning | ORAL | Status: DC
  Administered 2023-12-09: 300 ug via ORAL
  Filled 2023-12-08 (×2): qty 3

## 2023-12-08 MED ORDER — ENOXAPARIN SODIUM 40 MG/0.4ML IJ SOSY
40.0000 mg | PREFILLED_SYRINGE | INTRAMUSCULAR | Status: DC
  Administered 2023-12-08: 40 mg via SUBCUTANEOUS
  Filled 2023-12-08: qty 0.4

## 2023-12-08 MED ORDER — FENOFIBRATE 160 MG PO TABS
160.0000 mg | ORAL_TABLET | Freq: Every day | ORAL | Status: DC
  Administered 2023-12-08: 160 mg via ORAL
  Filled 2023-12-08 (×2): qty 1

## 2023-12-08 MED ORDER — PHENYLEPHRINE 80 MCG/ML (10ML) SYRINGE FOR IV PUSH (FOR BLOOD PRESSURE SUPPORT)
80.0000 ug | PREFILLED_SYRINGE | Freq: Once | INTRAVENOUS | Status: AC
  Administered 2023-12-08: 160 ug via INTRAVENOUS
  Filled 2023-12-08: qty 10

## 2023-12-08 MED ORDER — FLUMAZENIL 0.5 MG/5ML IV SOLN
0.3000 mg | Freq: Once | INTRAVENOUS | Status: AC
  Administered 2023-12-08: 0.3 mg via INTRAVENOUS

## 2023-12-08 MED ORDER — TAMSULOSIN HCL 0.4 MG PO CAPS: 0.4000 mg | ORAL_CAPSULE | Freq: Every day | ORAL | Status: DC

## 2023-12-08 MED ORDER — ACETAMINOPHEN 325 MG PO TABS: 650.0000 mg | ORAL_TABLET | Freq: Four times a day (QID) | ORAL | Status: DC | PRN

## 2023-12-08 MED ORDER — NOREPINEPHRINE 4 MG/250ML-% IV SOLN
0.0000 ug/min | INTRAVENOUS | Status: DC
  Administered 2023-12-08: 2.5 ug/min via INTRAVENOUS
  Filled 2023-12-08: qty 250

## 2023-12-08 MED ORDER — ROSUVASTATIN CALCIUM 5 MG PO TABS: 5.0000 mg | ORAL_TABLET | Freq: Every day | ORAL | Status: DC

## 2023-12-08 MED ORDER — EZETIMIBE 10 MG PO TABS
10.0000 mg | ORAL_TABLET | Freq: Every day | ORAL | Status: DC
  Administered 2023-12-08: 10 mg via ORAL
  Filled 2023-12-08: qty 1

## 2023-12-08 MED ORDER — POLYVINYL ALCOHOL 1.4 % OP SOLN
1.0000 [drp] | Freq: Two times a day (BID) | OPHTHALMIC | Status: DC | PRN
  Filled 2023-12-08: qty 15

## 2023-12-08 MED ORDER — OXYCODONE HCL 5 MG PO TABS
5.0000 mg | ORAL_TABLET | ORAL | Status: DC | PRN
  Administered 2023-12-08 – 2023-12-09 (×5): 5 mg via ORAL
  Filled 2023-12-08 (×5): qty 1

## 2023-12-08 MED ORDER — LORAZEPAM 2 MG/ML IJ SOLN
1.0000 mg | Freq: Once | INTRAMUSCULAR | Status: AC
  Administered 2023-12-08: 1 mg via INTRAVENOUS
  Filled 2023-12-08: qty 1

## 2023-12-08 MED ORDER — ONDANSETRON HCL 4 MG PO TABS: 4.0000 mg | ORAL_TABLET | Freq: Four times a day (QID) | ORAL | Status: DC | PRN

## 2023-12-08 MED ORDER — MIDAZOLAM HCL 2 MG/2ML IJ SOLN
2.0000 mg | Freq: Once | INTRAMUSCULAR | Status: DC | PRN
  Filled 2023-12-08: qty 2

## 2023-12-08 NOTE — H&P (Addendum)
 History and Physical  [facility]  [name] FMW:[id number] DOB: 10/24/1965 DOA: 12/08/2023  PCP: [facility]  Patient coming from: Home  Level of care: Telemetry Medical  I have personally briefly reviewed patient's old medical records in [facility]  Chief Complaint: persistent dizziness,n/v  HPI: Andrew Love Love is a 58 year old male with history of hypothyroidism, hypertension, OSA, CAD, low back pain status post lumbar decompression, history of hip replacement and shoulder arthroscopy, history of diverticulitis, prostate hypertrophy, status post cervical decompression surgery who reportedly in June 2025 received a cervical ablation at the [facility].  He is a retired IT sales professional, served in [employer] and has had issues with chronic cervical lumbar degenerative disc disease and multiple operations.  He has had multiple recent ED visits for recurrent nausea, vomiting, vertigo symptoms that acutely worsened after the cervical ablation in June 2025.  He has been unable to have an MRI performed due to severe claustrophobia.  He did have CT angiography and CT perfusion studies with no significant findings other than a unilateral vertebral blockage.  He followed up with Dr. Modena Callander with outpatient neurology on 12/07/23 and was started on Nurtec as needed with zofran  for his occipital headache and nausea and was ordered to have an MRI of the brain to rule out brainstem/cerebellum pathology.  He was not able to start Nurtec yet.    He presented to [facility] ED on 8/2 complaining of nausea, vomiting and vertigo symptoms with numbness from occiput down the cervical spine which was not a new findings.  He had taken zofran , valium , meclizine  without relief.   He was given a trial of droperidol  and lorazepam  in the ED and unfortunately had a paradoxical reaction and became persistently hypotensive with systolic blood pressure readings in the 80s.  His blood pressure did not improve with  fluid administration.  He received 2 doses of IV phenylephrine  and was briefly on a norepinephrine  infusion.  He was subsequently treated with flumazenil  x 2 doses and fortunately his blood pressures rapidly improved and he was able to be taken off all pressors.  MRI was recommended however patient has severe claustrophobia and will require general anesthesia to have an MRI performed.  Therefore he is being admitted to [facility] for [facility] under anesthesia and neurology consultation.    Past Medical History:  Diagnosis Date   Arthritis    CAD (coronary artery disease)    Chronic back pain    Chronic left hip pain    Colitis    Per medical history from dated 06/13/10.   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   Sigmoid colon ulcer    Rectal polyps   Sleep apnea with use of continuous positive airway pressure (CPAP)    Urticaria     Past Surgical History:  Procedure Laterality Date   ABLATION Right 10/2023   APPENDECTOMY     8th grade   BACK SURGERY     BIOPSY  10/17/2021   Procedure: BIOPSY;  Surgeon: Cindie Carlin POUR, DO;  Location: [facility] ENDO SUITE;  Service: Endoscopy;;   CARDIAC CATHETERIZATION  2009   CARDIAC CATHETERIZATION     Per medical history from dated 06/13/10.   CERVICAL SPINE SURGERY     C4, C5, C6 spinal fusion   CHOLECYSTECTOMY N/A 08/31/2015   Procedure: LAPAROSCOPIC CHOLECYSTECTOMY WITH INTRAOPERATIVE CHOLANGIOGRAM;  Surgeon: Morene Olives, MD;  Location: [facility] ORS;  Service: General;  Laterality: N/A;   COLONOSCOPY  07/2008   Colitis,NSAID v. Ischemia,malrotation of the gut,Diverticulosis(L),hyperplastic   COLONOSCOPY N/A 12/13/2012   Dr. Harvey: Normal TI,  mild sigmoid diverticulosis, hemorrhoids, 2 polyps (tubular adenoma). Random colon bx negative. Next TCS 12/2022 with Fentanyl /phenergan    COLONOSCOPY WITH PROPOFOL  N/A 10/17/2021   Procedure: COLONOSCOPY WITH PROPOFOL ;  Surgeon: Cindie Carlin POUR, DO;  Location: [facility];  Service: Endoscopy;  Laterality: N/A;  10:15am   ESOPHAGOGASTRODUODENOSCOPY N/A 04/16/2014   RMR: Erosive reflux esophagitis. Non critical Schzki's ring not manipulated. Small hiatal hernia. Abnormal gastirc mucosa of doubtful signigicance status post biopsy. I suspect trivial upper GI bleed. Recent abdominal pain presumably secondary to a protracted bout of diverticulitis. CT scan November 23 revealed improvemetn without complication. He finished his antibiotics 2 days age. Left sided ab   ESOPHAGOGASTRODUODENOSCOPY (EGD) WITH PROPOFOL  N/A 10/17/2021   Procedure: ESOPHAGOGASTRODUODENOSCOPY (EGD) WITH PROPOFOL ;  Surgeon: Cindie Carlin POUR, DO;  Location: [facility];  Service: Endoscopy;  Laterality: N/A;   JOINT REPLACEMENT     LAPAROSCOPIC SIGMOID COLECTOMY  2012   Dr. Elon Pacini: recurrent sigmoid diverticulitis   LAPAROSCOPIC SIGMOID COLECTOMY  2012   recurernt sigmoid diverticulitis, Dr. Pacini    LEFT HEART CATH AND CORONARY ANGIOGRAPHY N/A 06/03/2021   Procedure: LEFT HEART CATH AND CORONARY ANGIOGRAPHY;  Surgeon: Wendel Lurena POUR, MD;  Location: [facility] INVASIVE CV LAB;  Service: Cardiovascular;  Laterality: N/A;   POLYPECTOMY  10/17/2021   Procedure: POLYPECTOMY;  Surgeon: Cindie Carlin POUR, DO;  Location: [facility];  Service: Endoscopy;;   ROTATOR CUFF REPAIR     Left - per medical history form dated 06/13/10.   SHOULDER SURGERY     Left   THYROIDECTOMY     Per medical history form dated 06/13/10.   TOTAL HIP ARTHROPLASTY  07/04/2011   Procedure: TOTAL HIP ARTHROPLASTY;  Surgeon: Fonda SHAUNNA Olmsted, MD;  Location: [facility] OR;  Service: Orthopedics;  Laterality: Right;   TOTAL HIP ARTHROPLASTY Left 07/22/2019    Procedure: TOTAL HIP ARTHROPLASTY;  Surgeon: Olmsted Fonda, MD;  Location: [facility]S;  Service: Orthopedics;  Laterality: Left;     reports that he quit smoking about 4 years ago. His smoking use included cigarettes. He started smoking about 22 years ago. He has a 9 pack-year smoking history. He has never used smokeless tobacco. He reports that he does not drink alcohol  and does not use drugs.  Allergies  Allergen Reactions   Dust Mite Mixed Allergen Ext [Mite (D. Farinae)] Anaphylaxis and Hives   Other Hives and Shortness Of Breath    Cockroaches   Iohexol  Hives   Almond (Diagnostic) Hives   Crestor  [Rosuvastatin ]     Muscle/joint pain    Oysters [Shellfish Allergy] Hives   Wheat Hives   Yeast-Derived Drug Products Hives    Family History  Problem Relation Age of Onset   Cancer Mother        breast cancer - per medical history form dated 06/13/10.   Diverticulitis Mother    Hypertension Mother    Cancer Father        skin - per medical history form dated 06/13/10.   Hypertension  Father    Anesthesia problems Neg Hx    Hypotension Neg Hx    Malignant hyperthermia Neg Hx    Pseudochol deficiency Neg Hx    Colon cancer Neg Hx     Prior to Admission medications   Medication Sig Start Date End Date Taking? Authorizing Provider  acetaminophen  (TYLENOL ) 500 MG tablet Take 500-1,000 mg by mouth every 6 (six) hours as needed for mild pain or fever.    [provider]  azithromycin  (ZITHROMAX ) 500 MG tablet Take 1 tablet (500 mg total) by mouth daily. 07/30/22   Evonnie Lenis, MD  buPROPion  (WELLBUTRIN  SR) 150 MG 12 hr tablet Take 150 mg by mouth See admin instructions. Take 2 tablets by mouth every morning for mood. 04/14/22   [provider]  cholecalciferol (VITAMIN D3) 25 MCG (1000 UNIT) tablet Take 1,000 Units by mouth at bedtime.    [provider]  diphenhydrAMINE  (BENADRYL ) 25 MG tablet Take 25-50 mg by mouth every 6 (six) hours as needed for allergies.     [provider]  EPINEPHrine  (EPIPEN ) 0.3 mg/0.3 mL SOAJ Inject 0.3 mLs (0.3 mg total) into the muscle as needed. Patient not taking: Reported on 12/07/2023 12/01/12   Sheran Rogue, MD  famotidine  (PEPCID ) 40 MG tablet Take 40 mg by mouth daily as needed for indigestion or heartburn. 04/18/22   [provider]  gabapentin  (NEURONTIN ) 300 MG capsule Take 600 mg by mouth at bedtime. 01/18/22   [provider]  ibuprofen  (ADVIL ) 200 MG tablet Take 800 mg by mouth every 8 (eight) hours as needed for moderate pain.    [provider]  ipratropium (ATROVENT) 0.03 % nasal spray Place 1-2 sprays into both nostrils See admin instructions. Spray 1-2 sprays into each nostril two to three times a day for rhintis 01/31/22   [provider]  isosorbide  mononitrate (IMDUR ) 30 MG 24 hr tablet Take 1 tablet (30 mg total) by mouth daily. 10/06/21 12/07/23  Strader, Laymon HERO, PA-C  ketotifen (ZADITOR) 0.025 % ophthalmic solution Place 1 drop into both eyes 2 (two) times daily. 01/18/22   [provider]  levothyroxine  (SYNTHROID , LEVOTHROID) 300 MCG tablet Take 300 mcg by mouth every morning.  03/26/14   [provider]  loratadine  (CLARITIN ) 10 MG tablet Take 10 mg by mouth daily. 07/18/22   [provider]  losartan  (COZAAR ) 100 MG tablet Take 100 mg by mouth daily.    [provider]  meclizine  (ANTIVERT ) 25 MG tablet Take 1 tablet (25 mg total) by mouth 3 (three) times daily as needed for dizziness. 04/09/17   Dean Clarity, MD  methocarbamol  (ROBAXIN ) 500 MG tablet Take 500 mg by mouth 3 (three) times daily. Patient not taking: Reported on 12/07/2023 01/11/23   [provider]  metoCLOPramide  (REGLAN ) 5 MG tablet Take 1 tablet (5 mg total) by mouth 3 (three) times daily before meals. Patient not taking: Reported on 12/07/2023 07/29/22   Evonnie Lenis, MD  mirtazapine  (REMERON ) 15 MG tablet Take 7.5 mg by mouth at bedtime. 07/18/22   [provider]  naloxone [facility]) nasal spray 4 mg/0.1 mL Place 1 spray into the nose once. Patient not taking: Reported on 12/07/2023 07/18/21   [provider]  nitroGLYCERIN  (NITROSTAT ) 0.4 MG SL tablet Place 1 tablet (0.4 mg total) under the tongue every 5 (five) minutes as needed for chest pain. Up to 3 tablets then call 911 or go to the nearest ED. Patient not taking: Reported on  12/07/2023 10/06/21 07/26/22  Strader, Laymon HERO, PA-C  Omega-3 Fatty Acids (FISH OIL) 1000 MG CAPS Take 2,000 mg by mouth daily.    [provider]  omeprazole  (PRILOSEC) 40 MG capsule Take 1 capsule (40 mg total) by mouth 2 (two) times daily. 10/17/21 12/07/23  Cindie Carlin POUR, DO  ondansetron  (ZOFRAN -ODT) 4 MG disintegrating tablet Take 1 tablet (4 mg total) by mouth every 8 (eight) hours as needed for nausea or vomiting. 07/29/22   Tat, Alm, MD  ondansetron  (ZOFRAN -ODT) 4 MG disintegrating tablet Take 1 tablet (4 mg total) by mouth every 8 (eight) hours as needed. 12/07/23   Onita Duos, MD  oxyCODONE  (ROXICODONE ) 5 MG immediate release tablet Take 1 tablet (5 mg total) by mouth every 4 (four) hours as needed for severe pain. Patient not taking: Reported on 12/07/2023 12/10/21   Theadore Ozell HERO, MD  oxyCODONE -acetaminophen  (PERCOCET/ROXICET) 5-325 MG tablet Take 1 tablet by mouth every 8 (eight) hours as needed for moderate pain (pain score 4-6). 11/07/23   [provider]  Rimegepant Sulfate (NURTEC) 75 MG TBDP Take 1 tablet (75 mg total) by mouth every other day. 12/07/23   Onita Duos, MD  rosuvastatin  (CRESTOR ) 10 MG tablet Take 5 mg by mouth daily. 01/24/22   [provider]  tamsulosin  (FLOMAX ) 0.4 MG CAPS capsule Take 1 capsule by mouth daily. 01/18/22   [provider]  Tetrahydroz-Dextran-PEG-Povid (VISINE ADVANCED RELIEF) 0.05-0.1-1-1 % SOLN Place 1 drop into both eyes daily as needed (dry eyes). Patient not taking: Reported on 12/07/2023    [provider]  tiZANidine  (ZANAFLEX) 4 MG tablet Take 4 mg by mouth 3 (three) times daily. 10/11/23   [provider]    Physical Exam: Vitals:   12/08/23 0930 12/08/23 0940 12/08/23 0950 12/08/23 1000  BP: 106/62 114/68 110/68 104/66  Pulse: 61 (!) 56 (!) 59 (!) 56  Resp: 14 14 13 14   Temp:      TempSrc:      SpO2: 96% 95% 97% 95%  Weight:      Height:        Constitutional: NAD, calm, comfortable Eyes: PERRL, lids and conjunctivae normal ENMT: Mucous membranes are moist. Posterior pharynx clear of any exudate or lesions.Normal dentition.  Neck: normal, supple, no masses, no thyromegaly Respiratory: clear to auscultation bilaterally, no wheezing, no crackles. Normal respiratory effort. No accessory muscle use.  Cardiovascular: normal s1, s2 sounds, no murmurs / rubs / gallops. No extremity edema. 2+ pedal pulses. No carotid bruits.  Abdomen: no tenderness, no masses palpated. No hepatosplenomegaly. Bowel sounds positive.  Musculoskeletal: no clubbing / cyanosis. No joint deformity upper and lower extremities. Good ROM, no contractures. Normal muscle tone.  Skin: no rashes, lesions, ulcers. No induration Neurologic: CN 2-12 grossly intact. Sensation intact, DTR normal. Strength 5/5 in all 4.  Psychiatric: Normal judgment and insight. Alert and oriented x 3. Normal mood.   Labs on Admission: I have personally reviewed following labs and imaging studies  CBC: Recent Labs  Lab 12/01/23 2314 12/01/23 2317 12/08/23 0400  WBC 9.5  --  12.3*  NEUTROABS 5.5  --  8.0*  HGB 16.2 16.0 16.2  HCT 46.2 47.0 44.5  MCV 91.5  --  89.0  PLT 257  --  247   Basic Metabolic Panel: Recent Labs  Lab 12/01/23 2314 12/01/23 2317 12/08/23 0400  NA 138 138 132*  K 4.0 4.0 3.8  CL 101 100 95*  CO2 23  --  23  GLUCOSE 217* 216* 318*  BUN 16 18 15   CREATININE 1.13 1.10 1.22  CALCIUM  9.2  --  9.2   GFR: Estimated Creatinine Clearance: 83.3 mL/min (by C-G formula based on SCr of 1.22 mg/dL). Liver Function  Tests: Recent Labs  Lab 12/01/23 2314 12/08/23 0400  AST 27 27  ALT 42 50*  ALKPHOS 85 81  BILITOT 0.7 0.8  PROT 7.4 7.3  ALBUMIN 4.0 4.0   No results for input(s): LIPASE, AMYLASE in the last 168 hours. No results for input(s): AMMONIA in the last 168 hours. Coagulation Profile: Recent Labs  Lab 12/01/23 2314  INR 0.9   Cardiac Enzymes: No results for input(s): CKTOTAL, CKMB, CKMBINDEX, TROPONINI in the last 168 hours. BNP (last 3 results) No results for input(s): PROBNP in the last 8760 hours. HbA1C: No results for input(s): HGBA1C in the last 72 hours. CBG: Recent Labs  Lab 12/01/23 2311  GLUCAP 217*   Lipid Profile: No results for input(s): CHOL, HDL, LDLCALC, TRIG, CHOLHDL, LDLDIRECT in the last 72 hours. Thyroid  Function Tests: No results for input(s): TSH, T4TOTAL, FREET4, T3FREE, THYROIDAB in the last 72 hours. Anemia Panel: No results for input(s): VITAMINB12, FOLATE, FERRITIN, TIBC, IRON, RETICCTPCT in the last 72 hours. Urine analysis:    Component Value Date/Time   COLORURINE YELLOW 07/25/2022 1919   APPEARANCEUR CLEAR 07/25/2022 1919   LABSPEC 1.015 07/25/2022 1919   PHURINE 6.0 07/25/2022 1919   GLUCOSEU NEGATIVE 07/25/2022 1919   HGBUR NEGATIVE 07/25/2022 1919   BILIRUBINUR NEGATIVE 07/25/2022 1919   KETONESUR NEGATIVE 07/25/2022 1919   PROTEINUR NEGATIVE 07/25/2022 1919   UROBILINOGEN 0.2 03/31/2014 0150   NITRITE NEGATIVE 07/25/2022 1919   LEUKOCYTESUR NEGATIVE 07/25/2022 1919    Radiological Exams on Admission: No results found.  EKG: Independently reviewed. NSR no acute ST/T wave changes seen  Assessment/Plan Principal Problem:   Persistent vertigo of central origin Active Problems:   CAD (coronary artery disease)   Essential hypertension   HLD (hyperlipidemia)   Hypothyroidism   Nausea without vomiting   Vomiting   Obesity (BMI 30.0-34.9)   Gastroesophageal reflux disease  without esophagitis   Generalized anxiety disorder   OSA (obstructive sleep apnea)   Type 2 diabetes mellitus with diabetic neuropathy, unspecified (HCC)   Dizziness   Leukocytosis    Persistent dizziness, nausea, headache and blurry vision since cervical ablation in June 2025 -- MRI brain was recommended by Dr. Onita on outpatient visit 12/07/23 -- Pt has severe claustrophobia and will need to have MRI with anesthesia (ordered by ED provider)  -- admit to [facility] with anesthesis -- neurology consultation requested (Khaliqdina/[facility]) -- symptomatic treatments  -- Update* after speaking with neurologist Dr. Matthews recommendation is to hold off MRI at this time. Further recs pending neuro consult. I discontinued MRI orders placed by ED provider   Severe Hypotension after lorazepam  and droperidol  -- he briefly required pressors -- hypotension resolved after 2 doses of flumazenil  -- holding benzodiazepine for now  GAD -- resume home bupropion   Neuropathy -- resume home gabapentin   CAD -- imdur , rosuvastatin   Hypothyroidism  -- resume home levothyroxine   Essential hypertension  -- holding home BP meds due to hypotension, soft BPs  GERD -- omeprazole  ordered   DVT prophylaxis: enoxaparin    Code Status: Full   Family Communication: updated at bedside   Disposition Plan: admit to [facility]   Consults called: neurology   Admission status: IP Time spent: 63 mins  Level of care: Telemetry Medical Arno Cullers  Vicci MD [facility] How to contact the [facility] Attending or Consulting provider 7A - 7P or covering provider during after hours 7P -7A, for this patient?  Check the care team in [facility] and look for a) attending/consulting [facility] provider listed and b) the [facility] team listed Log into [email or web address] and use [place]'s universal password to access. If you do not have the password, please contact the hospital operator. Locate the [facility] provider you are looking for under [facility]  and page to a number that you can be directly reached. If you still have difficulty reaching the provider, please page the [facility] (Director on Call) for the Hospitalists listed on amion for assistance.   If 7PM-7AM, please contact night-coverage [email or web address] Password [facility]  12/08/2023, 10:46 AM

## 2023-12-08 NOTE — Consult Note (Incomplete)
 NEUROLOGY CONSULT NOTE   Date of service: December 08, 2023 Patient Name: Andrew Love MRN:  [id number] DOB:  1965-08-14 Chief Complaint: Dizziness Requesting Provider: Vicci Afton LITTIE, MD  History of Present Illness  Andrew Love is a 58 y.o. male with hx of intermittent vertigo who has had much more severely disabling vertigo, blurry vision, head pressure since undergoing cervical ablation in June.  He states that initially, he felt something off, but did not start having vertigo until a few hours later.  Since that time, he has had a waxing/waning degree of dizziness.  He describes episodes that come out of the blue, and are not positional but he has had some dizziness with sitting up as well.  He also describes intermittent episodes of blurred vision and spots in his vision.  He does have head pressure at times which is new, he does not have a history of headaches.  He does have a history of vertigo which would come on relatively suddenly and last for 24 hours prior to improving.  He would take meclizine  and lay down for the day and typically feel better by the next day.  He also has been having paresthesias of his bilateral face as well as numbness of his bilateral face.  These also come and go.  Past History   Past Medical History:  Diagnosis Date  . Arthritis   . CAD (coronary artery disease)   . Chronic back pain   . Chronic left hip pain   . Colitis    Per medical history from dated 06/13/10.  . Diverticulitis    Hx of; requiring 3 admissions  . Elevated liver enzymes   . GERD (gastroesophageal reflux disease)   . Hyperlipidemia LDL goal <70   . Hypertension   . Hypothyroidism   . Left leg pain    Chronic  . 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 Pneumonia 02/2019  . Psoriasis    Per medical history form dated 06/13/10.  . Sigmoid colon ulcer    Rectal polyps  . Sleep apnea with use of continuous positive airway pressure (CPAP)   .  Urticaria     Past Surgical History:  Procedure Laterality Date  . ABLATION Right 10/2023  . APPENDECTOMY     8th grade  . BACK SURGERY    . BIOPSY  10/17/2021   Procedure: BIOPSY;  Surgeon: Cindie Carlin POUR, DO;  Location: [facility];  Service: Endoscopy;;  . CARDIAC CATHETERIZATION  2009  . CARDIAC CATHETERIZATION     Per medical history from dated 06/13/10.  . CERVICAL SPINE SURGERY     C4, C5, C6 spinal fusion  . CHOLECYSTECTOMY N/A 08/31/2015   Procedure: LAPAROSCOPIC CHOLECYSTECTOMY WITH INTRAOPERATIVE CHOLANGIOGRAM;  Surgeon: Morene Olives, MD;  Location: [facility];  Service: General;  Laterality: N/A;  . COLONOSCOPY  07/2008   Colitis,NSAID v. Ischemia,malrotation of the gut,Diverticulosis(L),hyperplastic  . COLONOSCOPY N/A 12/13/2012   Dr. Harvey: Normal TI, mild sigmoid diverticulosis, hemorrhoids, 2 polyps (tubular adenoma). Random colon bx negative. Next TCS 12/2022 with Fentanyl /phenergan   . COLONOSCOPY WITH PROPOFOL  N/A 10/17/2021   Procedure: COLONOSCOPY WITH PROPOFOL ;  Surgeon: Cindie Carlin POUR, DO;  Location: [facility];  Service: Endoscopy;  Laterality: N/A;  10:15am  . ESOPHAGOGASTRODUODENOSCOPY N/A 04/16/2014   RMR: Erosive reflux esophagitis. Non critical Schzki's ring not manipulated. Small hiatal hernia. Abnormal gastirc mucosa of doubtful signigicance status post biopsy. I suspect trivial upper GI bleed. Recent abdominal pain presumably secondary  to a protracted bout of diverticulitis. CT scan November 23 revealed improvemetn without complication. He finished his antibiotics 2 days age. Left sided ab  . ESOPHAGOGASTRODUODENOSCOPY (EGD) WITH PROPOFOL  N/A 10/17/2021   Procedure: ESOPHAGOGASTRODUODENOSCOPY (EGD) WITH PROPOFOL ;  Surgeon: Cindie Carlin POUR, DO;  Location: [facility];  Service: Endoscopy;  Laterality: N/A;  . JOINT REPLACEMENT    . LAPAROSCOPIC SIGMOID COLECTOMY  2012   Dr. Elon Pacini: recurrent sigmoid diverticulitis  . LAPAROSCOPIC  SIGMOID COLECTOMY  2012   recurernt sigmoid diverticulitis, Dr. Pacini   . LEFT HEART CATH AND CORONARY ANGIOGRAPHY N/A 06/03/2021   Procedure: LEFT HEART CATH AND CORONARY ANGIOGRAPHY;  Surgeon: Wendel Lurena POUR, MD;  Location: [facility];  Service: Cardiovascular;  Laterality: N/A;  . POLYPECTOMY  10/17/2021   Procedure: POLYPECTOMY;  Surgeon: Cindie Carlin POUR, DO;  Location: [facility];  Service: Endoscopy;;  . ROTATOR CUFF REPAIR     Left - per medical history form dated 06/13/10.  SABRA SHOULDER SURGERY     Left  . THYROIDECTOMY     Per medical history form dated 06/13/10.  SABRA TOTAL HIP ARTHROPLASTY  07/04/2011   Procedure: TOTAL HIP ARTHROPLASTY;  Surgeon: Fonda SHAUNNA Olmsted, MD;  Location: [facility];  Service: Orthopedics;  Laterality: Right;  . TOTAL HIP ARTHROPLASTY Left 07/22/2019   Procedure: TOTAL HIP ARTHROPLASTY;  Surgeon: Olmsted Fonda, MD;  Location: [facility];  Service: Orthopedics;  Laterality: Left;    Family History: Family History  Problem Relation Age of Onset  . Cancer Mother        breast cancer - per medical history form dated 06/13/10.  . Diverticulitis Mother   . Hypertension Mother   . Cancer Father        skin - per medical history form dated 06/13/10.  SABRA Hypertension Father   . Anesthesia problems Neg Hx   . Hypotension Neg Hx   . Malignant hyperthermia Neg Hx   . Pseudochol deficiency Neg Hx   . Colon cancer Neg Hx     Social History  reports that he quit smoking about 4 years ago. His smoking use included cigarettes. He started smoking about 22 years ago. He has a 9 pack-year smoking history. He has never used smokeless tobacco. He reports that he does not drink alcohol  and does not use drugs.  Allergies  Allergen Reactions  . Dust Mite Mixed Allergen Ext [Mite (D. Farinae)] Anaphylaxis and Hives  . Other Hives and Shortness Of Breath    Cockroaches  . Iohexol  Hives  . Almond (Diagnostic) Hives  . Crestor  [Rosuvastatin ] Other (See Comments)     Myalgias  Arthralgias  . Oysters [Shellfish Allergy] Hives  . Wheat Hives  . Yeast-Derived Drug Products Hives    Medications   Current Facility-Administered Medications:  .  0.9 %  sodium chloride  infusion, , Intravenous, Continuous, Johnson, Clanford L, MD, Last Rate: 75 mL/hr at 12/08/23 1213, New Bag at 12/08/23 1213 .  acetaminophen  (TYLENOL ) tablet 650 mg, 650 mg, Oral, Q6H PRN **OR** acetaminophen  (TYLENOL ) suppository 650 mg, 650 mg, Rectal, Q6H PRN, Johnson, Clanford L, MD .  artificial tears ophthalmic solution 1 drop, 1 drop, Both Eyes, BID PRN, Johnson, Clanford L, MD .  bisacodyl  (DULCOLAX) EC tablet 5 mg, 5 mg, Oral, Daily PRN, Johnson, Clanford L, MD .  buPROPion  (WELLBUTRIN  SR) 12 hr tablet 300 mg, 300 mg, Oral, Daily, Johnson, Clanford L, MD, 300 mg at 12/08/23 1559 .  enoxaparin  (LOVENOX ) injection 40  mg, 40 mg, Subcutaneous, Q24H, Johnson, Clanford L, MD, 40 mg at Dec 15, 2023 2107 .  ezetimibe  (ZETIA ) tablet 10 mg, 10 mg, Oral, QHS, Johnson, Clanford L, MD, 10 mg at 12/15/23 2107 .  fenofibrate  tablet 160 mg, 160 mg, Oral, QHS, Johnson, Clanford L, MD, 160 mg at 12/15/23 2107 .  fentaNYL  (SUBLIMAZE ) injection 25 mcg, 25 mcg, Intravenous, Q2H PRN, Johnson, Clanford L, MD .  insulin  aspart (novoLOG ) injection 0-20 Units, 0-20 Units, Subcutaneous, TID WC, Johnson, Clanford L, MD, 3 Units at 12-15-2023 1720 .  insulin  aspart (novoLOG ) injection 5 Units, 5 Units, Subcutaneous, TID WC, Johnson, Clanford L, MD, 5 Units at December 15, 2023 1720 .  [START ON 12/09/2023] levothyroxine  (SYNTHROID ) tablet 300 mcg, 300 mcg, Oral, q morning, Johnson, Clanford L, MD .  loratadine  (CLARITIN ) tablet 10 mg, 10 mg, Oral, Daily PRN, Johnson, Clanford L, MD .  methocarbamol  (ROBAXIN ) tablet 500 mg, 500 mg, Oral, TID PRN, Vicci, Clanford L, MD, 500 mg at 12-15-23 1423 .  midazolam  (VERSED ) injection 2 mg, 2 mg, Intravenous, Once PRN, Michaela Aisha SQUIBB, MD .  mirtazapine  (REMERON ) tablet 7.5 mg, 7.5 mg,  Oral, QHS, Johnson, Clanford L, MD, 7.5 mg at 2023-12-15 2107 .  olopatadine  (PATANOL) 0.1 % ophthalmic solution 1 drop, 1 drop, Both Eyes, BID PRN, Johnson, Clanford L, MD .  ondansetron  (ZOFRAN ) tablet 4 mg, 4 mg, Oral, Q6H PRN **OR** ondansetron  (ZOFRAN ) injection 4 mg, 4 mg, Intravenous, Q6H PRN, Johnson, Clanford L, MD, 4 mg at Dec 15, 2023 1600 .  oxyCODONE  (Oxy IR/ROXICODONE ) immediate release tablet 5 mg, 5 mg, Oral, Q4H PRN, Vicci, Clanford L, MD, 5 mg at 12/15/2023 2116 .  pantoprazole  (PROTONIX ) EC tablet 40 mg, 40 mg, Oral, Daily, Johnson, Clanford L, MD, 40 mg at 12-15-2023 1423 .  prochlorperazine  (COMPAZINE ) injection 10 mg, 10 mg, Intravenous, Q4H PRN, Vicci, Clanford L, MD  Vitals   Vitals:   Dec 15, 2023 1000 Dec 15, 2023 1151 12-15-23 1500 2023-12-15 1945  BP: 104/66 122/76 (!) 136/105 139/82  Pulse: (!) 56 71 72 81  Resp: 14 18 18 17   Temp:  98.2 F (36.8 C) 97.6 F (36.4 C) 98.1 F (36.7 C)  TempSrc:  Oral Oral   SpO2: 95% 96% 97% 98%  Weight:      Height:        Body mass index is 31.1 kg/m.   Physical Exam   Constitutional: Appears well-developed and well-nourished.   Neurologic Examination    Neuro: Mental Status: Patient is awake, alert, oriented to person, place, month, year, and situation. Patient is able to give a clear and coherent history. No signs of aphasia or neglect Cranial Nerves: II: Visual Fields are full. Pupils are equal, round, and reactive to light.   III,IV, VI: EOMI without ptosis or diploplia.  V: Facial sensation is symmetric to temperature VII: Facial movement is symmetric.  VIII: hearing is intact to voice X: Uvula elevates symmetrically XII: tongue is midline without atrophy or fasciculations.  Motor: Tone is normal. Bulk is normal. 5/5 strength was present in all four extremities.  Sensory: Sensation is symmetric to light touch and temperature in the arms and legs. Deep Tendon Reflexes: 2+ and symmetric in the biceps and patellae.   Plantars: Toes are downgoing bilaterally.  Cerebellar: FNF and HKS are intact bilaterally      Labs/Imaging/Neurodiagnostic studies   CBC:  Recent Labs  Lab 12/15/2023 0400 2023/12/15 1058  WBC 12.3* 8.5  NEUTROABS 8.0*  --   HGB 16.2 14.5  HCT 44.5  41.5  MCV 89.0 91.4  PLT 247 176   Basic Metabolic Panel:  Lab Results  Component Value Date   NA 132 (L) 12/08/2023   K 3.8 12/08/2023   CO2 23 12/08/2023   GLUCOSE 318 (H) 12/08/2023   BUN 15 12/08/2023   CREATININE 0.98 12/08/2023   CALCIUM  9.2 12/08/2023   GFRNONAA >60 12/08/2023   GFRAA >60 08/06/2019   Lipid Panel:  Lab Results  Component Value Date   LDLCALC 102 (H) 06/02/2021   HgbA1c:  Lab Results  Component Value Date   HGBA1C 7.6 (H) 07/27/2022   Urine Drug Screen:     Component Value Date/Time   LABOPIA NONE DETECTED 03/21/2014 0528   COCAINSCRNUR NONE DETECTED 03/21/2014 0528   LABBENZ NONE DETECTED 03/21/2014 0528   AMPHETMU NONE DETECTED 03/21/2014 0528   THCU NONE DETECTED 03/21/2014 0528   LABBARB NONE DETECTED 03/21/2014 0528    Alcohol  Level     Component Value Date/Time   ETH <15 12/01/2023 2314   INR  Lab Results  Component Value Date   INR 0.9 12/01/2023   APTT  Lab Results  Component Value Date   APTT 27 12/01/2023   CT Head without contrast(Personally reviewed): Negative  CT angio Head and Neck with contrast(Personally reviewed): Moderate unilateral vertebral stenosis in  a codominant vertebral system  ASSESSMENT   Andrew Love is a 58 y.o. male with a waxing/waning dizziness, blurred vision, bilateral facial paresthesia since mid June.  My suspicion is that his intermittent dizziness prior to June could be related to migraine aura and he has essentially been having frequent migraines since that time.  I agree with Dr. Ross that an MRI would be prudent, and he has a history of severe claustrophobia.  I had a very long discussion with the patient regarding my concerns  about having general anesthesia when we could wait and have an open MRI done as an outpatient.  The patient states that he is willing to try with benzodiazepine  RECOMMENDATIONS  *** ______________________________________________________________________    Signed, Aisha Seals, MD [facility]

## 2023-12-08 NOTE — ED Notes (Signed)
 Messaged MD on pts reported requirements to tolerate MRI due to claustrophobia. PT was educated that sedation may not be appropriate due to BP status.

## 2023-12-08 NOTE — Consult Note (Signed)
 NEUROLOGY CONSULT NOTE   Date of service: December 08, 2023 Patient Name: Andrew Love MRN:  [id number] DOB:  24-Aug-1965 Chief Complaint: Dizziness Requesting Provider: Vicci Afton LITTIE, MD  History of Present Illness  Andrew Love is a 58 y.o. male with hx of intermittent vertigo who has had much more severely disabling vertigo, blurry vision, head pressure since undergoing cervical ablation in June.  He states that initially, he felt something off, but did not start having vertigo until a few hours later.  Since that time, he has had a waxing/waning degree of dizziness.  He describes episodes that come out of the blue, and are not positional but he has had some dizziness with sitting up as well.  He also describes intermittent episodes of blurred vision and spots in his vision.  He does have head pressure at times which is new, he does not have a history of headaches.  He does have a history of vertigo which would come on relatively suddenly and last for 24 hours prior to improving.  He would take meclizine  and lay down for the day and typically feel better by the next day.  He also has been having paresthesias of his bilateral face as well as numbness of his bilateral face.  These also come and go.  Past History   Past Medical History:  Diagnosis Date   Arthritis    CAD (coronary artery disease)    Chronic back pain    Chronic left hip pain    Colitis    Per medical history from dated 06/13/10.   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   Sigmoid colon ulcer    Rectal polyps   Sleep apnea with use of continuous positive airway pressure (CPAP)    Urticaria     Past  Surgical History:  Procedure Laterality Date   ABLATION Right 10/2023   APPENDECTOMY     8th grade   BACK SURGERY     BIOPSY  10/17/2021   Procedure: BIOPSY;  Surgeon: Cindie Carlin POUR, DO;  Location: [facility];  Service: Endoscopy;;   CARDIAC CATHETERIZATION  2009   CARDIAC CATHETERIZATION     Per medical history from dated 06/13/10.   CERVICAL SPINE SURGERY     C4, C5, C6 spinal fusion   CHOLECYSTECTOMY N/A 08/31/2015   Procedure: LAPAROSCOPIC CHOLECYSTECTOMY WITH INTRAOPERATIVE CHOLANGIOGRAM;  Surgeon: Morene Olives, MD;  Location: [facility];  Service: General;  Laterality: N/A;   COLONOSCOPY  07/2008   Colitis,NSAID v. Ischemia,malrotation of the gut,Diverticulosis(L),hyperplastic   COLONOSCOPY N/A 12/13/2012   Dr. Harvey: Normal TI, mild sigmoid diverticulosis, hemorrhoids, 2 polyps (tubular adenoma). Random colon bx negative. Next TCS 12/2022 with Fentanyl /phenergan    COLONOSCOPY WITH PROPOFOL  N/A 10/17/2021   Procedure: COLONOSCOPY WITH PROPOFOL ;  Surgeon: Cindie Carlin POUR, DO;  Location: [facility];  Service: Endoscopy;  Laterality: N/A;  10:15am   ESOPHAGOGASTRODUODENOSCOPY N/A 04/16/2014   RMR: Erosive reflux esophagitis. Non critical Schzki's ring not manipulated. Small hiatal hernia. Abnormal gastirc mucosa of doubtful signigicance status post biopsy. I suspect trivial upper GI bleed. Recent abdominal pain presumably secondary  to a protracted bout of diverticulitis. CT scan November 23 revealed improvemetn without complication. He finished his antibiotics 2 days age. Left sided ab   ESOPHAGOGASTRODUODENOSCOPY (EGD) WITH PROPOFOL  N/A 10/17/2021   Procedure: ESOPHAGOGASTRODUODENOSCOPY (EGD) WITH PROPOFOL ;  Surgeon: Cindie Carlin POUR, DO;  Location: [facility];  Service: Endoscopy;  Laterality: N/A;   JOINT REPLACEMENT     LAPAROSCOPIC SIGMOID COLECTOMY  2012   Dr. Elon Pacini: recurrent sigmoid diverticulitis   LAPAROSCOPIC SIGMOID COLECTOMY  2012   recurernt  sigmoid diverticulitis, Dr. Pacini    LEFT HEART CATH AND CORONARY ANGIOGRAPHY N/A 06/03/2021   Procedure: LEFT HEART CATH AND CORONARY ANGIOGRAPHY;  Surgeon: Wendel Lurena POUR, MD;  Location: [facility];  Service: Cardiovascular;  Laterality: N/A;   POLYPECTOMY  10/17/2021   Procedure: POLYPECTOMY;  Surgeon: Cindie Carlin POUR, DO;  Location: [facility];  Service: Endoscopy;;   ROTATOR CUFF REPAIR     Left - per medical history form dated 06/13/10.   SHOULDER SURGERY     Left   THYROIDECTOMY     Per medical history form dated 06/13/10.   TOTAL HIP ARTHROPLASTY  07/04/2011   Procedure: TOTAL HIP ARTHROPLASTY;  Surgeon: Fonda SHAUNNA Olmsted, MD;  Location: [facility];  Service: Orthopedics;  Laterality: Right;   TOTAL HIP ARTHROPLASTY Left 07/22/2019   Procedure: TOTAL HIP ARTHROPLASTY;  Surgeon: Olmsted Fonda, MD;  Location: [facility];  Service: Orthopedics;  Laterality: Left;    Family History: Family History  Problem Relation Age of Onset   Cancer Mother        breast cancer - per medical history form dated 06/13/10.   Diverticulitis Mother    Hypertension Mother    Cancer Father        skin - per medical history form dated 06/13/10.   Hypertension Father    Anesthesia problems Neg Hx    Hypotension Neg Hx    Malignant hyperthermia Neg Hx    Pseudochol deficiency Neg Hx    Colon cancer Neg Hx     Social History  reports that he quit smoking about 4 years ago. His smoking use included cigarettes. He started smoking about 22 years ago. He has a 9 pack-year smoking history. He has never used smokeless tobacco. He reports that he does not drink alcohol  and does not use drugs.  Allergies  Allergen Reactions   Dust Mite Mixed Allergen Ext [Mite (D. Farinae)] Anaphylaxis and Hives   Other Hives and Shortness Of Breath    Cockroaches   Iohexol  Hives   Almond (Diagnostic) Hives   Crestor  [Rosuvastatin ] Other (See Comments)    Myalgias  Arthralgias   Oysters [Shellfish Allergy] Hives    Wheat Hives   Yeast-Derived Drug Products Hives    Medications   Current Facility-Administered Medications:    0.9 %  sodium chloride  infusion, , Intravenous, Continuous, Johnson, Clanford L, MD, Last Rate: 75 mL/hr at 12/08/23 1213, New Bag at 12/08/23 1213   acetaminophen  (TYLENOL ) tablet 650 mg, 650 mg, Oral, Q6H PRN **OR** acetaminophen  (TYLENOL ) suppository 650 mg, 650 mg, Rectal, Q6H PRN, Johnson, Clanford L, MD   artificial tears ophthalmic solution 1 drop, 1 drop, Both Eyes, BID PRN, Johnson, Clanford L, MD   bisacodyl  (DULCOLAX) EC tablet 5 mg, 5 mg, Oral, Daily PRN, Johnson, Clanford L, MD   buPROPion  (WELLBUTRIN  SR) 12 hr tablet 300 mg, 300 mg, Oral, Daily, Johnson, Clanford L, MD, 300 mg at 12/08/23 1559   enoxaparin  (LOVENOX ) injection 40  mg, 40 mg, Subcutaneous, Q24H, Johnson, Clanford L, MD, 40 mg at 12/25/2023 2107   ezetimibe  (ZETIA ) tablet 10 mg, 10 mg, Oral, QHS, Johnson, Clanford L, MD, 10 mg at 2023-12-25 2107   fenofibrate  tablet 160 mg, 160 mg, Oral, QHS, Johnson, Clanford L, MD, 160 mg at 12-25-2023 2107   fentaNYL  (SUBLIMAZE ) injection 25 mcg, 25 mcg, Intravenous, Q2H PRN, Johnson, Clanford L, MD   insulin  aspart (novoLOG ) injection 0-20 Units, 0-20 Units, Subcutaneous, TID WC, Johnson, Clanford L, MD, 3 Units at 25-Dec-2023 1720   insulin  aspart (novoLOG ) injection 5 Units, 5 Units, Subcutaneous, TID WC, Johnson, Clanford L, MD, 5 Units at 12/25/2023 1720   [START ON 12/09/2023] levothyroxine  (SYNTHROID ) tablet 300 mcg, 300 mcg, Oral, q morning, Johnson, Clanford L, MD   loratadine  (CLARITIN ) tablet 10 mg, 10 mg, Oral, Daily PRN, Johnson, Clanford L, MD   methocarbamol  (ROBAXIN ) tablet 500 mg, 500 mg, Oral, TID PRN, Vicci, Clanford L, MD, 500 mg at Dec 25, 2023 1423   midazolam  (VERSED ) injection 2 mg, 2 mg, Intravenous, Once PRN, Michaela Aisha SQUIBB, MD   mirtazapine  (REMERON ) tablet 7.5 mg, 7.5 mg, Oral, QHS, Johnson, Clanford L, MD, 7.5 mg at 12/25/2023 2107   olopatadine   (PATANOL) 0.1 % ophthalmic solution 1 drop, 1 drop, Both Eyes, BID PRN, Johnson, Clanford L, MD   ondansetron  (ZOFRAN ) tablet 4 mg, 4 mg, Oral, Q6H PRN **OR** ondansetron  (ZOFRAN ) injection 4 mg, 4 mg, Intravenous, Q6H PRN, Johnson, Clanford L, MD, 4 mg at Dec 25, 2023 1600   oxyCODONE  (Oxy IR/ROXICODONE ) immediate release tablet 5 mg, 5 mg, Oral, Q4H PRN, Vicci, Clanford L, MD, 5 mg at 2023-12-25 2116   pantoprazole  (PROTONIX ) EC tablet 40 mg, 40 mg, Oral, Daily, Johnson, Clanford L, MD, 40 mg at 12/25/2023 1423   prochlorperazine  (COMPAZINE ) injection 10 mg, 10 mg, Intravenous, Q4H PRN, Vicci, Clanford L, MD  Vitals   Vitals:   12-25-23 1000 2023/12/25 1151 25-Dec-2023 1500 12/25/23 1945  BP: 104/66 122/76 (!) 136/105 139/82  Pulse: (!) 56 71 72 81  Resp: 14 18 18 17   Temp:  98.2 F (36.8 C) 97.6 F (36.4 C) 98.1 F (36.7 C)  TempSrc:  Oral Oral   SpO2: 95% 96% 97% 98%  Weight:      Height:        Body mass index is 31.1 kg/m.   Physical Exam   Constitutional: Appears well-developed and well-nourished.   Neurologic Examination    Neuro: Mental Status: Patient is awake, alert, oriented to person, place, month, year, and situation. Patient is able to give a clear and coherent history. No signs of aphasia or neglect Cranial Nerves: II: Visual Fields are full. Pupils are equal, round, and reactive to light.   III,IV, VI: EOMI without ptosis or diploplia.  V: Facial sensation is symmetric to temperature VII: Facial movement is symmetric.  VIII: hearing is intact to voice X: Uvula elevates symmetrically XII: tongue is midline without atrophy or fasciculations.  Motor: Tone is normal. Bulk is normal. 5/5 strength was present in all four extremities.  Sensory: Sensation is symmetric to light touch and temperature in the arms and legs. Deep Tendon Reflexes: 2+ and symmetric in the biceps and patellae.  Plantars: Toes are downgoing bilaterally.  Cerebellar: FNF and HKS are  intact bilaterally      Labs/Imaging/Neurodiagnostic studies   CBC:  Recent Labs  Lab Dec 25, 2023 0400 2023/12/25 1058  WBC 12.3* 8.5  NEUTROABS 8.0*  --   HGB 16.2 14.5  HCT 44.5  41.5  MCV 89.0 91.4  PLT 247 176   Basic Metabolic Panel:  Lab Results  Component Value Date   NA 132 (L) 12/08/2023   K 3.8 12/08/2023   CO2 23 12/08/2023   GLUCOSE 318 (H) 12/08/2023   BUN 15 12/08/2023   CREATININE 0.98 12/08/2023   CALCIUM  9.2 12/08/2023   GFRNONAA >60 12/08/2023   GFRAA >60 08/06/2019   Lipid Panel:  Lab Results  Component Value Date   LDLCALC 102 (H) 06/02/2021   HgbA1c:  Lab Results  Component Value Date   HGBA1C 7.6 (H) 07/27/2022   Urine Drug Screen:     Component Value Date/Time   LABOPIA NONE DETECTED 03/21/2014 0528   COCAINSCRNUR NONE DETECTED 03/21/2014 0528   LABBENZ NONE DETECTED 03/21/2014 0528   AMPHETMU NONE DETECTED 03/21/2014 0528   THCU NONE DETECTED 03/21/2014 0528   LABBARB NONE DETECTED 03/21/2014 0528    Alcohol  Level     Component Value Date/Time   ETH <15 12/01/2023 2314   INR  Lab Results  Component Value Date   INR 0.9 12/01/2023   APTT  Lab Results  Component Value Date   APTT 27 12/01/2023   CT Head without contrast(Personally reviewed): Negative  CT angio Head and Neck with contrast(Personally reviewed): Moderate unilateral vertebral stenosis in  a codominant vertebral system  ASSESSMENT   Andrew Love is a 58 y.o. male with a waxing/waning dizziness, blurred vision, bilateral facial paresthesia since mid June.  My suspicion is that his intermittent dizziness prior to June could be related to migraine aura and he has essentially been having frequent migraines since that time.  I agree with Dr. Ross that an MRI would be prudent, and he has a history of severe claustrophobia.  I had a very long discussion with the patient regarding my concerns about having general anesthesia when we could wait and have an open MRI  done as an outpatient.  The patient states that he is willing to try with anxiolytics.   RECOMMENDATIONS  Compazine  10 mg plus Benadryl  25 mg MRI brain, will premedicate with Versed  2 mg IV x 1 If MRI is negative, agree with trial of nurtec to see if he has improvement in his symptoms.  Neurology will follow  ______________________________________________________________________    Signed, Aisha Seals, MD [facility]

## 2023-12-08 NOTE — TOC CM/SW Note (Signed)
 Transition of Care [facility]) - Inpatient Brief Assessment   Patient Details  Name: GAVEN EUGENE MRN: [id number] Date of Birth: May 10, 1965  [facility]) CM/SW Contact:    Noreen KATHEE Pinal, LCSWA Phone Number: 12/08/2023, 9:19 AM   Clinical Narrative:  Transition of Care Department [facility]) has reviewed patient and no TOC needs have been identified at this time. We will continue to monitor patient advancement through interdisciplinary progression rounds. If new patient transition needs arise, please place a TOC consult.  Transition of Care Asessment: Insurance and Status: Insurance coverage has been reviewed Patient has primary care physician: Yes Home environment has been reviewed: Single Family Home Prior level of function:: Independent Prior/Current Home Services: No current home services Social Drivers of Health Review: SDOH reviewed no interventions necessary Readmission risk has been reviewed: Yes Transition of care needs: no transition of care needs at this time

## 2023-12-08 NOTE — Hospital Course (Addendum)
 58 year old male with history of hypothyroidism, hypertension, OSA, CAD, low back pain status post lumbar decompression, history of hip replacement and shoulder arthroscopy, history of diverticulitis, prostate hypertrophy, status post cervical decompression surgery who reportedly in June 2025 received a cervical ablation at the [facility].  He is a retired IT sales professional, served in [employer] and has had issues with chronic cervical lumbar degenerative disc disease and multiple operations.  He has had multiple recent ED visits for recurrent nausea, vomiting, vertigo symptoms that acutely worsened after the cervical ablation in June 2025.  He has been unable to have an MRI performed due to severe claustrophobia.  He did have CT angiography and CT perfusion studies with no significant findings other than a unilateral vertebral blockage.  He followed up with Dr. Modena Callander with outpatient neurology on 12/07/23 and was started on Nurtec as needed with zofran  for his occipital headache and nausea and was ordered to have an MRI of the brain to rule out brainstem/cerebellum pathology.  He was not able to start Nurtec yet.    He presented to [facility] ED on 8/2 complaining of nausea, vomiting and vertigo symptoms with numbness from occiput down the cervical spine which was not a new findings.  He had taken zofran , valium , meclizine  without relief.   He was given a trial of droperidol  and lorazepam  in the ED and unfortunately had a paradoxical reaction and became persistently hypotensive with systolic blood pressure readings in the 80s.  His blood pressure did not improve with fluid administration.  He received 2 doses of IV phenylephrine  and was briefly on a norepinephrine  infusion.  He was subsequently treated with flumazenil  x 2 doses and fortunately his blood pressures rapidly improved and he was able to be taken off all pressors.  MRI was recommended however patient has severe claustrophobia and will require general anesthesia  to have an MRI performed.  Therefore he is being admitted to [facility] for MRI under anesthesia and neurology consultation.

## 2023-12-08 NOTE — ED Provider Notes (Signed)
 [place] EMERGENCY DEPARTMENT AT [facility] Provider Note  CSN: [id number] Arrival date & time: 12/08/23 9693  Chief Complaint(s) Dizziness, Nausea, and Emesis  HPI Andrew Love is a 58 y.o. male with PMH CAD, HTN, HLD who presents emerged part for evaluation of nausea, vomiting, vertigo.  The symptoms appear to have acutely worsened after he had a cervical ablation and he was seen on 12/01/2023 in the emergency department as a stroke alert due to concern for persistent dizziness.  He had CT angiography and CT perfusion studies that were reassuringly normal outside of a unilateral vertebral artery blockage but did not have a MRI performed due to severe claustrophobia.  He followed up in the outpatient setting with neurology yesterday with persistent symptoms and there is a plan to start him on a new migraine medication but he has not picked up this medicine yet.  His symptoms have been persistent and states that they have since progressed to persistent dizziness and vertigo with nausea and vomiting at rest.  He states that he is having diplopia and symptoms do worsen with head movement but do not fatigue will resolve when head is kept still.  Endorsing pain in the neck as well.  Denies numbness, tingling, weakness or other neurological plaints.  Denies chest pain, shortness of breath, fever or other systemic symptoms.   Past Medical History Past Medical History:  Diagnosis Date   Arthritis    CAD (coronary artery disease)    Chronic back pain    Chronic left hip pain    Colitis    Per medical history from dated 06/13/10.   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   Sigmoid  colon ulcer    Rectal polyps   Sleep apnea with use of continuous positive airway pressure (CPAP)    Urticaria    Patient Active Problem List   Diagnosis Date Noted   Acute maxillary sinusitis, unspecified 12/07/2023   Bacterial foodborne intoxication, unspecified 12/07/2023   Neck pain 12/07/2023   Encounter for issue of repeat prescription 12/07/2023   Ganglion, right wrist 12/07/2023   Gastroesophageal reflux disease without esophagitis 12/07/2023   Anxiety disorder, unspecified 12/07/2023   Generalized anxiety disorder 12/07/2023   Gustatory rhinitis 12/07/2023   Lateral epicondylitis, left elbow 12/07/2023   Localized swelling, mass and lump, trunk 12/07/2023   Other forms of chronic ischemic heart disease 12/07/2023   Otitis media, unspecified, bilateral 12/07/2023   Pain in other specified joint 12/07/2023   Pain in unspecified wrist 12/07/2023   Personal history of anaphylaxis 12/07/2023   Postoperative hypothyroidism 12/07/2023   Prediabetes 12/07/2023   Spondylosis without myelopathy or radiculopathy, cervical region 12/07/2023   Type 2 diabetes mellitus with diabetic neuropathy, unspecified (HCC) 12/07/2023   Dizziness 12/07/2023   OSA (obstructive sleep apnea) 12/19/2022   Campylobacter diarrhea 07/31/2022   Pancolitis (HCC) 07/27/2022   Colitis due to Campylobacter species 07/27/2022   Chest pain 06/02/2021   Obesity (BMI 30.0-34.9) 06/02/2021   Chronic back pain 06/02/2021   Osteoarthritis of left hip 07/22/2019   S/P total hip arthroplasty 07/22/2019   Food intolerance 07/09/2019  Chronic urticaria 07/09/2019   Seasonal and perennial allergic rhinitis 07/09/2019   Elevated liver enzymes    Left sided abdominal pain 04/18/2019   Diarrhea due to COVID-19 03/11/2019   Vomiting 03/10/2019   COVID-19 virus infection 03/10/2019   Hypokalemia 03/10/2019   AKI (acute kidney injury) (HCC) 03/10/2019   Metabolic acidosis 03/10/2019   Lumbar radiculopathy  01/26/2019   LUQ pain 06/19/2016   Symptomatic cholelithiasis 08/30/2015   Preoperative cardiovascular examination    Coronary artery disease involving native coronary artery of native heart without angina pectoris    Gastroenteritis 08/26/2015   Cholelithiases 08/24/2015   Abdominal pain 08/24/2015   Cholecystitis, acute 08/24/2015   Diarrhea 08/24/2015   Acute cholecystitis 08/24/2015   Nausea without vomiting 04/08/2015   Intractable vomiting with nausea 04/15/2014   Diverticulitis of colon    Cerebral thrombosis with cerebral infarction (HCC) 03/21/2014   Stroke (HCC) 03/21/2014   Essential hypertension 03/21/2014   TIA (transient ischemic attack)    HLD (hyperlipidemia)    Hypothyroidism    Colitis 11/20/2012   CAD (coronary artery disease)    Hip pain 08/04/2011   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 Authorizing Provider  acetaminophen  (TYLENOL ) 500 MG tablet Take 500-1,000 mg by mouth every 6 (six) hours as needed for mild pain or fever.    [provider]  azithromycin  (ZITHROMAX ) 500 MG tablet Take 1 tablet (500 mg total) by mouth daily. 07/30/22   Evonnie Lenis, MD  buPROPion  (WELLBUTRIN  SR) 150 MG 12 hr tablet Take 150 mg by mouth See admin instructions. Take 2 tablets by mouth every morning for mood. 04/14/22   [provider]  cholecalciferol (VITAMIN D3) 25 MCG (1000 UNIT) tablet Take 1,000 Units by mouth at bedtime.    [provider]  diphenhydrAMINE  (BENADRYL ) 25 MG tablet Take 25-50 mg by mouth every 6 (six) hours as needed for allergies.    [provider]  EPINEPHrine  (EPIPEN ) 0.3 mg/0.3 mL SOAJ Inject 0.3 mLs (0.3 mg total) into the muscle as needed. Patient not taking: Reported on 12/07/2023 12/01/12   Sheran Rogue, MD  famotidine  (PEPCID ) 40 MG tablet Take 40 mg  by mouth daily as needed for indigestion or heartburn. 04/18/22   [provider]  gabapentin  (NEURONTIN ) 300 MG capsule Take 600 mg by mouth at bedtime. 01/18/22   [provider]  ibuprofen  (ADVIL ) 200 MG tablet Take 800 mg by mouth every 8 (eight) hours as needed for moderate pain.    [provider]  ipratropium (ATROVENT) 0.03 % nasal spray Place 1-2 sprays into both nostrils See admin instructions. Spray 1-2 sprays into each nostril two to three times a day for rhintis 01/31/22   [provider]  isosorbide  mononitrate (IMDUR ) 30 MG 24 hr tablet Take 1 tablet (30 mg total) by mouth daily. 10/06/21 12/07/23  Strader, Laymon HERO, PA-C  ketotifen (ZADITOR) 0.025 % ophthalmic solution Place 1 drop into both eyes 2 (two) times daily. 01/18/22   [provider]  levothyroxine  (SYNTHROID , LEVOTHROID) 300 MCG tablet Take 300 mcg by mouth every morning.  03/26/14   [provider]  loratadine  (CLARITIN ) 10 MG tablet Take 10 mg by mouth daily. 07/18/22   [provider]  losartan  (COZAAR ) 100 MG tablet Take 100 mg by mouth daily.    [provider]  meclizine  (ANTIVERT ) 25 MG tablet Take 1 tablet (25 mg total) by mouth 3 (three) times daily as needed for dizziness. 04/09/17   Dean Clarity, MD  methocarbamol  (ROBAXIN ) 500 MG tablet Take 500 mg by mouth 3 (three) times daily. Patient not taking: Reported on 12/07/2023 01/11/23   [provider]  metoCLOPramide  (REGLAN ) 5 MG tablet Take 1 tablet (5 mg total) by mouth 3 (three) times daily before meals. Patient not taking: Reported on 12/07/2023 07/29/22   Evonnie Lenis, MD  mirtazapine  (REMERON ) 15 MG tablet Take 7.5 mg by mouth at bedtime. 07/18/22   [provider]  naloxone [facility]) nasal spray 4 mg/0.1 mL Place 1 spray into the nose once. Patient not taking: Reported on 12/07/2023 07/18/21   [provider]  nitroGLYCERIN  (NITROSTAT ) 0.4 MG SL tablet Place 1 tablet (0.4 mg  total) under the tongue every 5 (five) minutes as needed for chest pain. Up to 3 tablets then call 911 or go to the nearest ED. Patient not taking: Reported on 12/07/2023 10/06/21 07/26/22  Strader, Brittany M, PA-C  Omega-3 Fatty Acids (FISH OIL) 1000 MG CAPS Take 2,000 mg by mouth daily.    [provider]  omeprazole  (PRILOSEC) 40 MG capsule Take 1 capsule (40 mg total) by mouth 2 (two) times daily. 10/17/21 12/07/23  Cindie Carlin POUR, DO  ondansetron  (ZOFRAN -ODT) 4 MG disintegrating tablet Take 1 tablet (4 mg total) by mouth every 8 (eight) hours as needed for nausea or vomiting. 07/29/22   Tat, Lenis, MD  ondansetron  (ZOFRAN -ODT) 4 MG disintegrating tablet Take 1 tablet (4 mg total) by mouth every 8 (eight) hours as needed. 12/07/23   Onita Duos, MD  oxyCODONE  (ROXICODONE ) 5 MG immediate release tablet Take 1 tablet (5 mg total) by mouth every 4 (four) hours as needed for severe pain. Patient not taking: Reported on 12/07/2023 12/10/21   Theadore Ozell HERO, MD  oxyCODONE -acetaminophen  (PERCOCET/ROXICET) 5-325 MG tablet Take 1 tablet by mouth every 8 (eight) hours as needed for moderate pain (pain score 4-6). 11/07/23   [provider]  Rimegepant Sulfate (NURTEC) 75 MG TBDP Take 1 tablet (75 mg total) by mouth every other day. 12/07/23   Onita Duos, MD  rosuvastatin  (CRESTOR ) 10 MG tablet Take 5 mg by mouth daily. 01/24/22   [provider]  tamsulosin  (FLOMAX ) 0.4 MG CAPS capsule Take 1 capsule by mouth daily. 01/18/22   [provider]  Tetrahydroz-Dextran-PEG-Povid (VISINE ADVANCED RELIEF) 0.05-0.1-1-1 % SOLN Place 1 drop into both eyes daily as needed (dry eyes). Patient not taking: Reported on 12/07/2023    [provider]  tiZANidine (ZANAFLEX) 4 MG tablet Take 4 mg by mouth 3 (three) times daily. 10/11/23   [provider]                                                                                                                                    Past  Surgical History Past Surgical History:  Procedure Laterality Date   ABLATION Right 10/2023   APPENDECTOMY     8th grade   BACK SURGERY     BIOPSY  10/17/2021   Procedure: BIOPSY;  Surgeon: Cindie Carlin POUR, DO;  Location: [facility];  Service: Endoscopy;;   CARDIAC CATHETERIZATION  2009   CARDIAC CATHETERIZATION     Per medical history from dated 06/13/10.   CERVICAL SPINE SURGERY     C4, C5, C6 spinal fusion   CHOLECYSTECTOMY N/A 08/31/2015   Procedure: LAPAROSCOPIC CHOLECYSTECTOMY WITH INTRAOPERATIVE CHOLANGIOGRAM;  Surgeon: Morene Olives, MD;  Location: [facility];  Service: General;  Laterality: N/A;   COLONOSCOPY  07/2008   Colitis,NSAID v. Ischemia,malrotation of the gut,Diverticulosis(L),hyperplastic   COLONOSCOPY N/A 12/13/2012   Dr. Harvey: Normal TI, mild sigmoid diverticulosis, hemorrhoids, 2 polyps (tubular adenoma). Random colon bx negative. Next TCS 12/2022 with Fentanyl /phenergan    COLONOSCOPY WITH PROPOFOL  N/A 10/17/2021   Procedure: COLONOSCOPY WITH PROPOFOL ;  Surgeon: Cindie Carlin POUR, DO;  Location: [facility];  Service: Endoscopy;  Laterality: N/A;  10:15am   ESOPHAGOGASTRODUODENOSCOPY N/A 04/16/2014   RMR: Erosive reflux esophagitis. Non critical Schzki's ring not manipulated. Small hiatal hernia. Abnormal gastirc mucosa of doubtful signigicance status post biopsy. I suspect trivial upper GI bleed. Recent abdominal pain presumably secondary to a protracted bout of diverticulitis. CT scan November 23 revealed improvemetn without complication. He finished his antibiotics 2 days age. Left sided ab   ESOPHAGOGASTRODUODENOSCOPY (EGD) WITH PROPOFOL  N/A 10/17/2021   Procedure: ESOPHAGOGASTRODUODENOSCOPY (EGD) WITH PROPOFOL ;  Surgeon: Cindie Carlin POUR, DO;  Location: [facility];  Service: Endoscopy;  Laterality: N/A;   JOINT REPLACEMENT     LAPAROSCOPIC SIGMOID COLECTOMY  2012   Dr. Elon Pacini: recurrent sigmoid diverticulitis   LAPAROSCOPIC SIGMOID COLECTOMY   2012   recurernt sigmoid diverticulitis, Dr. Pacini    LEFT HEART CATH AND CORONARY ANGIOGRAPHY N/A 06/03/2021   Procedure: LEFT HEART CATH AND CORONARY ANGIOGRAPHY;  Surgeon: Wendel Lurena POUR, MD;  Location: [facility];  Service: Cardiovascular;  Laterality: N/A;   POLYPECTOMY  10/17/2021   Procedure: POLYPECTOMY;  Surgeon: Cindie Carlin POUR, DO;  Location: [facility];  Service: Endoscopy;;   ROTATOR CUFF REPAIR     Left - per medical history form dated 06/13/10.   SHOULDER SURGERY     Left   THYROIDECTOMY     Per medical history form dated 06/13/10.   TOTAL HIP ARTHROPLASTY  07/04/2011   Procedure: TOTAL HIP ARTHROPLASTY;  Surgeon: Fonda SHAUNNA Olmsted, MD;  Location: [facility] OR;  Service: Orthopedics;  Laterality: Right;   TOTAL HIP ARTHROPLASTY Left 07/22/2019   Procedure: TOTAL HIP ARTHROPLASTY;  Surgeon: Olmsted Fonda, MD;  Location: [facility];  Service: Orthopedics;  Laterality: Left;   Family History Family History  Problem Relation Age of Onset   Cancer Mother        breast cancer - per medical history form dated 06/13/10.   Diverticulitis Mother    Hypertension Mother    Cancer Father        skin - per medical history form dated 06/13/10.   Hypertension Father    Anesthesia problems Neg Hx    Hypotension Neg Hx    Malignant hyperthermia Neg Hx    Pseudochol deficiency Neg Hx    Colon cancer Neg Hx     Social History Social History   Tobacco Use   Smoking status: Former    Current packs/day: 0.00  Average packs/day: 0.5 packs/day for 18.0 years (9.0 ttl pk-yrs)    Types: Cigarettes    Start date: 02/14/2001    Quit date: 02/15/2019    Years since quitting: 4.8   Smokeless tobacco: Never   Tobacco comments:       Vaping Use   Vaping status: Never Used  Substance Use Topics   Alcohol  use: No    Alcohol /week: 0.0 standard drinks of alcohol    Drug use: No   Allergies Dust mite mixed allergen ext [mite (d. farinae)], Other, Iohexol , Almond (diagnostic), Crestor   [rosuvastatin ], Oysters [shellfish allergy], Wheat, and Yeast-derived drug products  Review of Systems Review of Systems  Neurological:  Positive for dizziness.    Physical Exam Vital Signs  I have reviewed the triage vital signs BP 110/78   Pulse 72   Temp 97.8 F (36.6 C) (Oral)   Resp 16   Ht 6' (1.829 m)   Wt 104 kg   SpO2 97%   BMI 31.10 kg/m   Physical Exam Constitutional:      General: He is not in acute distress.    Appearance: Normal appearance.  HENT:     Head: Normocephalic and atraumatic.     Nose: No congestion or rhinorrhea.  Eyes:     General:        Right eye: No discharge.        Left eye: No discharge.     Extraocular Movements: Extraocular movements intact.     Pupils: Pupils are equal, round, and reactive to light.  Cardiovascular:     Rate and Rhythm: Normal rate and regular rhythm.     Heart sounds: No murmur heard. Pulmonary:     Effort: No respiratory distress.     Breath sounds: No wheezing or rales.  Abdominal:     General: There is no distension.     Tenderness: There is no abdominal tenderness.  Musculoskeletal:        General: Normal range of motion.     Cervical back: Normal range of motion.  Skin:    General: Skin is warm and dry.  Neurological:     General: No focal deficit present.     Mental Status: He is alert.     Cranial Nerves: No cranial nerve deficit (Persistent nystagmus).     Sensory: No sensory deficit.     Motor: No weakness.     ED Results and Treatments Labs (all labs ordered are listed, but only abnormal results are displayed) Labs Reviewed  COMPREHENSIVE METABOLIC PANEL WITH GFR - Abnormal; Notable for the following components:      Result Value   Sodium 132 (*)    Chloride 95 (*)    Glucose, Bld 318 (*)    ALT 50 (*)    All other components within normal limits  CBC WITH DIFFERENTIAL/PLATELET - Abnormal; Notable for the following components:   WBC 12.3 (*)    MCHC 36.4 (*)    Neutro Abs 8.0 (*)     Abs Immature Granulocytes 0.09 (*)    All other components within normal limits  Radiology No results found.  Pertinent labs & imaging results that were available during my care of the patient were reviewed by me and considered in my medical decision making (see MDM for details).  Medications Ordered in ED Medications  norepinephrine  (LEVOPHED ) 4mg  in (0.016 mg/mL) premix infusion (0 mcg/min Intravenous Paused 12/08/23 0538)  droperidol  (INAPSINE ) 2.5 MG/ML injection 1.25 mg (1.25 mg Intravenous Given 12/08/23 0401)  LORazepam  (ATIVAN ) injection 1 mg (1 mg Intravenous Given 12/08/23 0358)  lactated ringers  bolus 1,000 mL (0 mLs Intravenous Stopped 12/08/23 0527)  lactated ringers  bolus 1,000 mL (1,000 mLs Intravenous New Bag/Given 12/08/23 0443)  PHENYLephrine  80 mcg/ml in normal saline Adult IV Push Syringe (For Blood Pressure Support) (160 mcg Intravenous Given 12/08/23 0520)  flumazenil  (ROMAZICON ) injection 0.2 mg (0.2 mg Intravenous Given 12/08/23 0551)                                                                                                                                     Procedures .Critical Care  Performed by: Albertina Dixon, MD Authorized by: Albertina Dixon, MD   Critical care provider statement:    Critical care time (minutes):  30   Critical care was necessary to treat or prevent imminent or life-threatening deterioration of the following conditions:  Toxidrome and shock   Critical care was time spent personally by me on the following activities:  Development of treatment plan with patient or surrogate, discussions with consultants, evaluation of patient's response to treatment, examination of patient, ordering and review of laboratory studies, ordering and review of radiographic studies, ordering and performing treatments and interventions, pulse  oximetry, re-evaluation of patient's condition and review of old charts   (including critical care time)  Medical Decision Making / ED Course   This patient presents to the ED for concern of dizziness, vertigo, this involves an extensive number of treatment options, and is a complaint that carries with it a high risk of complications and morbidity.  The differential diagnosis includes BPPV, CVA, electrolyte abnormality, labyrinthitis/vestibular neuritis, Meniere's disease  MDM: Patient seen emerged part for evaluation of vertigo.  Physical exam with no focal motor or sensory deficits, no cranial nerve deficits.  Laboratory evaluation is unremarkable.  As patient has already failed meclizine  and home benzodiazepines, we will trial droperidol  and Ativan  here in the emergency department and monitor for improvement of patient's vertigo.  Unfortunately, patient appears to be very sensitive to IV benzodiazepines as he became persistently hypotensive with systolics in the 80s.  He received 2 L of fluid without improvement of his blood pressures.  Required intermittent pushes of phenylephrine  and a very short amount of time on a Levophed  drip.  We ultimately made the decision to reverse the benzodiazepines with flumazenil  and his blood pressures immediately returned to normal.  Patient's vertigo is persistent and I do have high concern for central stroke.  However, patient has severe claustrophobia and will  likely require general anesthesia have his MRI performed.  Thus he will require an admission at [place] for sedated MRI.  Patient symptoms have been ongoing for multiple days and he already did have emergent CT stroke imaging that was negative for LVO.  Thus we did not pursue any additional CT imaging.  Patient admitted   Additional history obtained: -Additional history obtained from wife -External records from outside source obtained and reviewed including: Chart review including previous notes, labs,  imaging, consultation notes   Lab Tests: -I ordered, reviewed, and interpreted labs.   The pertinent results include:   Labs Reviewed  COMPREHENSIVE METABOLIC PANEL WITH GFR - Abnormal; Notable for the following components:      Result Value   Sodium 132 (*)    Chloride 95 (*)    Glucose, Bld 318 (*)    ALT 50 (*)    All other components within normal limits  CBC WITH DIFFERENTIAL/PLATELET - Abnormal; Notable for the following components:   WBC 12.3 (*)    MCHC 36.4 (*)    Neutro Abs 8.0 (*)    Abs Immature Granulocytes 0.09 (*)    All other components within normal limits      EKG   EKG Interpretation Date/Time:  Saturday December 08 2023 03:27:18 EDT Ventricular Rate:  86 PR Interval:  147 QRS Duration:  101 QT Interval:  359 QTC Calculation: 430 R Axis:   -22  Text Interpretation: Sinus rhythm Borderline left axis deviation Abnormal R-wave progression, late transition Confirmed by Annalis Kaczmarczyk ([id number]) on 12/08/2023 3:48:22 AM         Imaging Studies ordered: I ordered imaging studies including MRI brain and C-spine and these are pending   Medicines ordered and prescription drug management: Meds ordered this encounter  Medications   droperidol  (INAPSINE ) 2.5 MG/ML injection 1.25 mg   LORazepam  (ATIVAN ) injection 1 mg   lactated ringers  bolus 1,000 mL   lactated ringers  bolus 1,000 mL   PHENYLephrine  80 mcg/ml in normal saline Adult IV Push Syringe (For Blood Pressure Support)   norepinephrine  (LEVOPHED ) 4mg  in (0.016 mg/mL) premix infusion   flumazenil  (ROMAZICON ) injection 0.2 mg    -I have reviewed the patients home medicines and have made adjustments as needed  Critical interventions Fluids, phenylephrine , Levophed , flumazenil    Cardiac Monitoring: The patient was maintained on a cardiac monitor.  I personally viewed and interpreted the cardiac monitored which showed an underlying rhythm of: NSR  Social Determinants of Health:  Factors  impacting patients care include: none   Reevaluation: After the interventions noted above, I reevaluated the patient and found that they have :stayed the same  Co morbidities that complicate the patient evaluation  Past Medical History:  Diagnosis Date   Arthritis    CAD (coronary artery disease)    Chronic back pain    Chronic left hip pain    Colitis    Per medical history from dated 06/13/10.   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   Sigmoid colon ulcer    Rectal polyps   Sleep apnea with use of continuous positive airway  pressure (CPAP)    Urticaria       Dispostion: I considered admission for this patient, and patient require hospital admission for persistent vertigo and concern for stroke     Final Clinical Impression(s) / ED Diagnoses Final diagnoses:  Dizziness     @PCDICTATION @    Albertina Dixon, MD 12/08/23 [phone]

## 2023-12-08 NOTE — ED Triage Notes (Addendum)
 Pt here for c/o N/V, vertigo, and numbness from back of head down. This is not a new condition. Family states this has been intermittent since he had an ablation in June. Pt states he has had 3 Zofrans, 1 Valium , and 1 Meclozine without relief. EDP @ bedside.

## 2023-12-08 NOTE — Plan of Care (Signed)

## 2023-12-09 DIAGNOSIS — H814 Vertigo of central origin: Secondary | ICD-10-CM

## 2023-12-09 DIAGNOSIS — G4486 Cervicogenic headache: Secondary | ICD-10-CM

## 2023-12-09 LAB — CBC WITH DIFFERENTIAL/PLATELET
Abs Immature Granulocytes: 0.05 K/uL (ref 0.00–0.07)
Basophils Absolute: 0.1 K/uL (ref 0.0–0.1)
Basophils Relative: 1 %
Eosinophils Absolute: 0.2 K/uL (ref 0.0–0.5)
Eosinophils Relative: 2 %
HCT: 40.6 % (ref 39.0–52.0)
Hemoglobin: 14.9 g/dL (ref 13.0–17.0)
Immature Granulocytes: 1 %
Lymphocytes Relative: 26 %
Lymphs Abs: 2.6 K/uL (ref 0.7–4.0)
MCH: 32.9 pg (ref 26.0–34.0)
MCHC: 36.7 g/dL — ABNORMAL HIGH (ref 30.0–36.0)
MCV: 89.6 fL (ref 80.0–100.0)
Monocytes Absolute: 0.7 K/uL (ref 0.1–1.0)
Monocytes Relative: 7 %
Neutro Abs: 6.3 K/uL (ref 1.7–7.7)
Neutrophils Relative %: 63 %
Platelets: 205 K/uL (ref 150–400)
RBC: 4.53 MIL/uL (ref 4.22–5.81)
RDW: 12.2 % (ref 11.5–15.5)
WBC: 10 K/uL (ref 4.0–10.5)
nRBC: 0 % (ref 0.0–0.2)

## 2023-12-09 LAB — GLUCOSE, CAPILLARY
Glucose-Capillary: 235 mg/dL — ABNORMAL HIGH (ref 70–99)
Glucose-Capillary: 171 mg/dL — ABNORMAL HIGH (ref 70–99)

## 2023-12-09 LAB — BASIC METABOLIC PANEL WITH GFR
Anion gap: 12 (ref 5–15)
BUN: 13 mg/dL (ref 6–20)
CO2: 21 mmol/L — ABNORMAL LOW (ref 22–32)
Calcium: 9 mg/dL (ref 8.9–10.3)
Chloride: 102 mmol/L (ref 98–111)
Creatinine, Ser: 1.21 mg/dL (ref 0.61–1.24)
GFR, Estimated: >60 mL/min (ref >60)
Glucose, Bld: 261 mg/dL — ABNORMAL HIGH (ref 70–99)
Potassium: 3.9 mmol/L (ref 3.5–5.1)
Sodium: 135 mmol/L (ref 135–145)

## 2023-12-09 LAB — PHOSPHORUS: Phosphorus: 3.4 mg/dL (ref 2.5–4.6)

## 2023-12-09 LAB — MAGNESIUM: Magnesium: 1.9 mg/dL (ref 1.7–2.4)

## 2023-12-09 MED ORDER — NURTEC 75 MG PO TBDP: 75.0000 mg | ORAL_TABLET | ORAL | 11 refills | Status: DC

## 2023-12-09 MED ORDER — DIAZEPAM 5 MG PO TABS: 5.0000 mg | ORAL_TABLET | Freq: Two times a day (BID) | ORAL | 0 refills | Status: AC | PRN

## 2023-12-09 NOTE — Discharge Summary (Signed)
 Physician Discharge Summary   Patient: Andrew Love MRN: [id number] DOB: 1965/07/27  Admit date:     12/08/2023  Discharge date: 12/09/23  Discharge Physician: Reyes VEAR Gaw   PCP: [facility]   Recommendations at discharge:    Patient discharged home with prescription for Nurtec recommended by inpatient neurologist here for complicated migraine.  Also [facility] 11.  Discharge Diagnoses: Principal Problem:   Persistent vertigo of central origin Active Problems:   CAD (coronary artery disease)   Essential hypertension   HLD (hyperlipidemia)   Hypothyroidism   Nausea without vomiting   Vomiting   Obesity (BMI 30.0-34.9)   Gastroesophageal reflux disease without esophagitis   Generalized anxiety disorder   OSA (obstructive sleep apnea)   Type 2 diabetes mellitus with diabetic neuropathy, unspecified (HCC)   Dizziness   Leukocytosis  Resolved Problems:   * No resolved hospital problems. *  Hospital Course: 58 year old male with history of hypothyroidism, hypertension, OSA, CAD, low back pain status post lumbar decompression, history of hip replacement and shoulder arthroscopy, history of diverticulitis, prostate hypertrophy, status post cervical decompression surgery who reportedly in June 2025 received a cervical ablation at the [facility].  He is a retired IT sales professional, served in [employer] and has had issues with chronic cervical lumbar degenerative disc disease and multiple operations.  He has had multiple recent ED visits for recurrent nausea, vomiting, vertigo symptoms that acutely worsened after the cervical ablation in June 2025.  He has been unable to have an MRI performed due to severe claustrophobia.  He did have CT angiography and CT perfusion studies with no significant findings other than a unilateral vertebral blockage.  He followed up with Dr. Modena Callander with outpatient neurology on  12/07/23 and was started on Nurtec as needed with zofran  for his occipital headache and nausea and was ordered to have an MRI of the brain to rule out brainstem/cerebellum pathology.  He was not able to start Nurtec yet.    He presented to [facility] on 8/2 complaining of nausea, vomiting and vertigo symptoms with numbness from occiput down the cervical spine which was not a new findings.  He had taken zofran , valium , meclizine  without relief.   He was given a trial of droperidol  and lorazepam  in the ED and unfortunately had a paradoxical reaction and became persistently hypotensive with systolic blood pressure readings in the 80s.  His blood pressure did not improve with fluid administration.  He received 2 doses of IV phenylephrine  and was briefly on a norepinephrine  infusion.  He was subsequently treated with flumazenil  x 2 doses and fortunately his blood pressures rapidly improved and he was able to be taken off all pressors.  MRI was recommended however patient has severe claustrophobia and will require general anesthesia to have an MRI performed.  Therefore he is being admitted to [facility] for MRI under anesthesia and neurology consultation.  Patient had MRI here status post Versed  and inpatient neurology was consulted.  MRI here was negative, which was reassuring.  Trial of Nurtec as neck step to see if he has improvement in his symptoms. Working diagnosis at time of discharge was vertigo secondary to pinched nerve from neck surgery as well as possibility of complex migraine.  Discharged home with Nurtec for the same and already has follow-up with neurosurgery 12/17/2023.  Due to his degree of improvement --he was able to ambulate around his room and no longer had the  degree of vertigo that he has had and overall felt much better -- he was able to be discharged 12/09/2023.       Consultants: Neurology Disposition: Home Diet recommendation:  Discharge Diet Orders (From admission, onward)     Start      Ordered   12/09/23 0000  Diet - low sodium heart healthy        12/09/23 1417   12/09/23 0000  Diet - low sodium heart healthy        12/09/23 1418           Regular diet DISCHARGE MEDICATION: Allergies as of 12/09/2023       Reactions   Dust Mite Mixed Allergen Ext [mite (d. Farinae)] Anaphylaxis, Hives   Other Hives, Shortness Of Breath   Cockroaches   Iohexol  Hives   Almond (diagnostic) Hives   Crestor  [rosuvastatin ] Other (See Comments)   Myalgias  Arthralgias   Oysters [shellfish Allergy] Hives   Wheat Hives   Yeast-derived Drug Products Hives        Medication List     TAKE these medications    buPROPion  150 MG 12 hr tablet Commonly known as: WELLBUTRIN  SR Take 300 mg by mouth daily.   CALCIUM  PO Take 1 tablet by mouth daily.   carboxymethylcellulose 0.5 % Soln Commonly known as: REFRESH PLUS Place 1 drop into both eyes 2 (two) times daily as needed (dry eyes).   chlorthalidone 25 MG tablet Commonly known as: HYGROTON Take 25 mg by mouth daily.   diazepam  5 MG tablet Commonly known as: VALIUM  Take 5 mg by mouth 3 (three) times daily as needed for muscle spasms.   diclofenac Sodium 1 % Gel Commonly known as: VOLTAREN Apply 1 Application topically daily as needed (pain).   EPINEPHrine  0.3 mg/0.3 mL Soaj injection Commonly known as: EpiPen  Inject 0.3 mLs (0.3 mg total) into the muscle as needed.   ezetimibe  10 MG tablet Commonly known as: ZETIA  Take 10 mg by mouth at bedtime.   fenofibrate  160 MG tablet Take 160 mg by mouth at bedtime.   FISH OIL PO Take 1 capsule by mouth daily.   ibuprofen  800 MG tablet Commonly known as: ADVIL  Take 800 mg by mouth every 8 (eight) hours as needed for headache or moderate pain (pain score 4-6).   ipratropium 0.06 % nasal spray Commonly known as: ATROVENT Place 1-2 sprays into both nostrils 3 (three) times daily as needed for rhinitis.   ketotifen 0.025 % ophthalmic solution Commonly known as:  ZADITOR Place 1 drop into both eyes 2 (two) times daily as needed (itchy eyes).   levothyroxine  300 MCG tablet Commonly known as: SYNTHROID  Take 300 mcg by mouth daily.   loratadine  10 MG tablet Commonly known as: CLARITIN  Take 10 mg by mouth daily as needed for allergies.   losartan  100 MG tablet Commonly known as: COZAAR  Take 100 mg by mouth daily.   MAGNESIUM  PO Take 1 tablet by mouth See admin instructions. Take 1 tablet by mouth every night. May take 1 tablet twice daily if needed for muscle cramps.   Meclizine  HCl 25 MG Chew Chew 12.5 mg by mouth 2 (two) times daily as needed (dizziness).   meloxicam 15 MG tablet Commonly known as: MOBIC Take 15 mg by mouth daily as needed for pain.   methocarbamol  500 MG tablet Commonly known as: ROBAXIN  Take 500 mg by mouth 3 (three) times daily as needed for muscle spasms.   mirtazapine  15 MG tablet Commonly  known as: REMERON  Take 7.5 mg by mouth at bedtime.   naloxone 4 MG/0.1ML Liqd nasal spray kit Commonly known as: NARCAN Place 1 spray into the nose as needed (opioid reversal).   Nurtec 75 MG Tbdp Generic drug: Rimegepant Sulfate Take 1 tablet (75 mg total) by mouth every other day.   omeprazole  40 MG capsule Commonly known as: PRILOSEC Take 1 capsule (40 mg total) by mouth 2 (two) times daily. What changed: when to take this   ondansetron  4 MG disintegrating tablet Commonly known as: ZOFRAN -ODT Take 1 tablet (4 mg total) by mouth every 8 (eight) hours as needed.   oxyCODONE -acetaminophen  5-325 MG tablet Commonly known as: PERCOCET/ROXICET Take 1 tablet by mouth 4 (four) times daily.   pregabalin 75 MG capsule Commonly known as: LYRICA Take 75 mg by mouth 2 (two) times daily as needed (neuropathy).   tiZANidine 4 MG tablet Commonly known as: ZANAFLEX Take 4 mg by mouth See admin instructions. Take 1 tablet (4mg ) by mouth at bedtime. May take 1 tablet up to 3 times daily as needed for muscle spasms   Vitamin  D3 50 MCG (2000 UT) Tabs Take 50 mcg by mouth daily.        Discharge Exam: Filed Weights   12/08/23 0320  Weight: 104 kg   Gen:  Alert, cooperative patient who appears stated age in no acute distress.  Vital signs reviewed. HEENT: [place]/AT Heart: Regular rate and rhythm Lungs: Clear throughout anterior chest Abdomen: Soft/nondistended/nontender Extremity: No lower extremity edema Neuro: Alert and orient x 4.  No current neurological deficits on exam.  Patient feels much better this afternoon than he did prior to coming into the hospital  Condition at discharge: good  The results of significant diagnostics from this hospitalization (including imaging, microbiology, ancillary and laboratory) are listed below for reference.   Imaging Studies: MR BRAIN WO CONTRAST Result Date: 12/09/2023 EXAM: MRI BRAIN WITHOUT CONTRAST 12/08/2023 11:54:04 PM TECHNIQUE: Multiplanar multisequence MRI of the head/brain was performed without the administration of intravenous contrast. COMPARISON: 03/21/2014 CLINICAL HISTORY: Ataxia, nontraumatic, stroke excluded. FINDINGS: BRAIN AND VENTRICLES: No acute infarct. No intracranial hemorrhage. No mass. No midline shift. No hydrocephalus. The sella is unremarkable. Normal flow voids. ORBITS: No acute abnormality. SINUSES AND MASTOIDS: No acute abnormality. BONES AND SOFT TISSUES: Normal marrow signal. No acute soft tissue abnormality. IMPRESSION: 1. No acute intracranial abnormality. Electronically signed by: Franky Stanford MD 12/09/2023 12:18 AM EDT RP Workstation: [id number]   CT ANGIO HEAD NECK W WO CM W PERF (CODE STROKE) Result Date: 12/02/2023 CLINICAL DATA:  Neuro deficit, acute, stroke suspected EXAM: CT ANGIOGRAPHY HEAD AND NECK CT PERFUSION BRAIN TECHNIQUE: Multidetector CT imaging of the head and neck was performed using the standard protocol during bolus administration of intravenous contrast. Multiplanar CT image reconstructions and MIPs were obtained to  evaluate the vascular anatomy. Carotid stenosis measurements (when applicable) are obtained utilizing NASCET criteria, using the distal internal carotid diameter as the denominator. Multiphase CT imaging of the brain was performed following IV bolus contrast injection. Subsequent parametric perfusion maps were calculated using RAPID software. RADIATION DOSE REDUCTION: This exam was performed according to the departmental dose-optimization program which includes automated exposure control, adjustment of the mA and/or kV according to patient size and/or use of iterative reconstruction technique. CONTRAST:  OMNIPAQUE  IOHEXOL  350 MG/ML SOLN COMPARISON:  Same day CT head. FINDINGS: CTA NECK FINDINGS Aortic arch: Great vessel origins are patent without significant stenosis. Right carotid system: No evidence of dissection,  stenosis (50% or greater) or occlusion. Left carotid system: No evidence of dissection, stenosis (50% or greater) or occlusion. Vertebral arteries: Moderate left vertebral artery origin stenosis. Otherwise, no evidence of dissection, stenosis (50% or greater) or occlusion. Skeleton: No acute abnormality on limited assessment. Other neck: No acute abnormality on limited assessment. Upper chest: Visualized lung apices are clear. Review of the MIP images confirms the above findings CTA HEAD FINDINGS Anterior circulation: Bilateral intracranial ICAs, MCAs, and ACAs are patent without proximal hemodynamically significant stenosis. Posterior circulation: Bilateral intradural vertebral arteries, basilar artery and bilateral posterior cerebral arteries are patent without proximal hemodynamically significant stenosis. Venous sinuses: As permitted by contrast timing, patent. Review of the MIP images confirms the above findings CT Brain Perfusion Findings: ASPECTS: 10 CBF (<30%) Volume: 0mL Perfusion (Tmax>6.0s) volume: 0mL Mismatch Volume: 0mL Infarction Location:None identified. IMPRESSION: 1. No emergent  large vessel occlusion. 2. Moderate left vertebral artery origin stenosis. 3. No evidence of core infarct or penumbra on CT perfusion. Electronically Signed   By: Gilmore GORMAN Molt M.D.   On: 12/02/2023 00:04   CT HEAD CODE STROKE WO CONTRAST Result Date: 12/01/2023 CLINICAL DATA:  Code stroke.  Neuro deficit, acute, stroke suspected EXAM: CT HEAD WITHOUT CONTRAST TECHNIQUE: Contiguous axial images were obtained from the base of the skull through the vertex without intravenous contrast. RADIATION DOSE REDUCTION: This exam was performed according to the departmental dose-optimization program which includes automated exposure control, adjustment of the mA and/or kV according to patient size and/or use of iterative reconstruction technique. COMPARISON:  None Available. FINDINGS: Brain: No evidence of acute infarction, hemorrhage, hydrocephalus, extra-axial collection or mass lesion/mass effect. Vascular: No hyperdense vessel. Skull: No acute fracture. Sinuses/Orbits: Clear sinuses.  No acute orbital findings. Other: No mastoid effusions. ASPECTS [facility] Stroke Program Early CT Score) Total score (0-10 with 10 being normal): 10. IMPRESSION: No evidence of acute intracranial abnormality. ASPECTS is 10. Code stroke imaging results were communicated on 12/01/2023 at 11:31 pm to provider Dr. Lannette via telephone. Electronically Signed   By: Gilmore GORMAN Molt M.D.   On: 12/01/2023 23:32    Microbiology: Results for orders placed or performed during the hospital encounter of 07/25/22  Gastrointestinal Panel by PCR , Stool     Status: Abnormal   Collection Time: 07/26/22 10:50 AM   Specimen: STOOL  Result Value Ref Range Status   Campylobacter species DETECTED (A) NOT DETECTED Final    Comment: RESULT CALLED TO, READ BACK BY AND VERIFIED WITH: LAYMON MARINA RN [id number] 07/27/22 HNM    Plesimonas shigelloides NOT DETECTED NOT DETECTED Final   Salmonella species NOT DETECTED NOT DETECTED Final   Yersinia  enterocolitica NOT DETECTED NOT DETECTED Final   Vibrio species NOT DETECTED NOT DETECTED Final   Vibrio cholerae NOT DETECTED NOT DETECTED Final   Enteroaggregative E coli (EAEC) NOT DETECTED NOT DETECTED Final   Enteropathogenic E coli (EPEC) NOT DETECTED NOT DETECTED Final   Enterotoxigenic E coli (ETEC) NOT DETECTED NOT DETECTED Final   Shiga like toxin producing E coli (STEC) NOT DETECTED NOT DETECTED Final   Shigella/Enteroinvasive E coli (EIEC) NOT DETECTED NOT DETECTED Final   Cryptosporidium NOT DETECTED NOT DETECTED Final   Cyclospora cayetanensis NOT DETECTED NOT DETECTED Final   Entamoeba histolytica NOT DETECTED NOT DETECTED Final   Giardia lamblia NOT DETECTED NOT DETECTED Final   Adenovirus F40/41 NOT DETECTED NOT DETECTED Final   Astrovirus NOT DETECTED NOT DETECTED Final   Norovirus GI/GII NOT DETECTED NOT DETECTED Final  Rotavirus A NOT DETECTED NOT DETECTED Final   Sapovirus (I, II, IV, and V) NOT DETECTED NOT DETECTED Final    Comment: Performed at [facility], [address] [place], [place] [address]  C Difficile Quick Screen w PCR reflex     Status: None   Collection Time: 07/26/22 10:50 AM   Specimen: STOOL  Result Value Ref Range Status   C Diff antigen NEGATIVE NEGATIVE Final   C Diff toxin NEGATIVE NEGATIVE Final   C Diff interpretation No C. difficile detected.  Final    Comment: Performed at [facility], [address], [place], [place] [address]  Resp panel by RT-PCR (RSV, Flu A&B, Covid) Anterior Nasal Swab     Status: None   Collection Time: 07/26/22 10:51 AM   Specimen: Anterior Nasal Swab  Result Value Ref Range Status   SARS Coronavirus 2 by RT PCR NEGATIVE NEGATIVE Final    Comment: (NOTE) SARS-CoV-2 target nucleic acids are NOT DETECTED.  The SARS-CoV-2 RNA is generally detectable in upper respiratory specimens during the acute phase of infection. The lowest concentration of SARS-CoV-2 viral copies this assay can detect is 138  copies/mL. A negative result does not preclude SARS-Cov-2 infection and should not be used as the sole basis for treatment or other patient management decisions. A negative result may occur with  improper specimen collection/handling, submission of specimen other than nasopharyngeal swab, presence of viral mutation(s) within the areas targeted by this assay, and inadequate number of viral copies(<138 copies/mL). A negative result must be combined with clinical observations, patient history, and epidemiological information. The expected result is Negative.  Fact Sheet for Patients:  BloggerCourse.com  Fact Sheet for Healthcare Providers:  SeriousBroker.it  This test is no t yet approved or cleared by the [place]  FDA and  has been authorized for detection and/or diagnosis of SARS-CoV-2 by FDA under an Emergency Use Authorization (EUA). This EUA will remain  in effect (meaning this test can be used) for the duration of the COVID-19 declaration under Section 564(b)(1) of the Act, 21 U.S.C.section 360bbb-3(b)(1), unless the authorization is terminated  or revoked sooner.       Influenza A by PCR NEGATIVE NEGATIVE Final   Influenza B by PCR NEGATIVE NEGATIVE Final    Comment: (NOTE) The Xpert Xpress SARS-CoV-2/FLU/RSV plus assay is intended as an aid in the diagnosis of influenza from Nasopharyngeal swab specimens and should not be used as a sole basis for treatment. Nasal washings and aspirates are unacceptable for Xpert Xpress SARS-CoV-2/FLU/RSV testing.  Fact Sheet for Patients: BloggerCourse.com  Fact Sheet for Healthcare Providers: SeriousBroker.it  This test is not yet approved or cleared by the [place]  FDA and has been authorized for detection and/or diagnosis of SARS-CoV-2 by FDA under an Emergency Use Authorization (EUA). This EUA will remain in effect (meaning  this test can be used) for the duration of the COVID-19 declaration under Section 564(b)(1) of the Act, 21 U.S.C. section 360bbb-3(b)(1), unless the authorization is terminated or revoked.     Resp Syncytial Virus by PCR NEGATIVE NEGATIVE Final    Comment: (NOTE) Fact Sheet for Patients: BloggerCourse.com  Fact Sheet for Healthcare Providers: SeriousBroker.it  This test is not yet approved or cleared by the [place]  FDA and has been authorized for detection and/or diagnosis of SARS-CoV-2 by FDA under an Emergency Use Authorization (EUA). This EUA will remain in effect (meaning this test can be used) for the duration of the COVID-19 declaration under Section 564(b)(1)  of the Act, 21 U.S.C. section 360bbb-3(b)(1), unless the authorization is terminated or revoked.  Performed at [facility], [address] [place], [place] [address]     Labs: CBC: Recent Labs  Lab 12/08/23 0400 12/08/23 1058 12/09/23 0159  WBC 12.3* 8.5 10.0  NEUTROABS 8.0*  --  6.3  HGB 16.2 14.5 14.9  HCT 44.5 41.5 40.6  MCV 89.0 91.4 89.6  PLT 247 176 205   Basic Metabolic Panel: Recent Labs  Lab 12/08/23 0400 12/08/23 1058 12/09/23 0159  NA 132*  --  135  K 3.8  --  3.9  CL 95*  --  102  CO2 23  --  21*  GLUCOSE 318*  --  261*  BUN 15  --  13  CREATININE 1.22 0.98 1.21  CALCIUM  9.2  --  9.0  MG  --   --  1.9  PHOS  --   --  3.4   Liver Function Tests: Recent Labs  Lab 12/08/23 0400  AST 27  ALT 50*  ALKPHOS 81  BILITOT 0.8  PROT 7.3  ALBUMIN 4.0   CBG: Recent Labs  Lab 12/08/23 1156 12/08/23 1651 12/08/23 2122 12/09/23 0802 12/09/23 1149  GLUCAP 193* 147* 184* 235* 171*    Discharge time spent: less than 30 minutes.  Signed: Reyes VEAR Gaw, MD [facility] 12/09/2023

## 2023-12-09 NOTE — Plan of Care (Signed)

## 2023-12-09 NOTE — Evaluation (Signed)
 Physical Therapy Evaluation and Vestibular Evaluation Patient Details Name: Andrew Love MRN: [id number] DOB: 08/26/1965 Today's Date: 12/09/2023  History of Present Illness  58 year old male  presented to [facility] ED on 8/2 complaining of nausea, vomiting and vertigo symptoms with numbness from occiput down the cervical spine which was not a new findings. CT unilateral vertebral blockage; MRI negative. PMH- hypothyroidism, hypertension, OSA, CAD, low back pain status post lumbar decompression, hip replacement and shoulder arthroscopy, diverticulitis, prostate hypertrophy, status post cervical decompression surgery who reportedly in June 2025 received a cervical ablation at the [facility]  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  He has had multiple recent ED visits for recurrent nausea, vomiting, vertigo symptoms that acutely worsened after the cervical ablation in June 2025.  Clinical Impression   Patient seen for Vestibular Evaluation. Although MRI brain noted to be negative, pt has symptoms consistent with central vertigo (diplopia, vertical skew deviation--right eye higher than left; near constant dizziness with only strategy that lessens symptoms is to lie in dark room; maximally increased symptoms with VOR cancellation testing; motion-sensitivity). Educated on role of PT in vestibular rehab and need to focus on habituation to stimuli that incr his symptoms. Educated on need to work in rest periods throughout the day to help bring symptoms down. Given name of local OPPT that specializes in Vestibular Rehab, however pt resides in [place] and may need/want to find someone closer to home due to motion sensitivity with car rides. Educated he can call this PT and see if she knows of anyone in his area. Discussed role of sensation in managing balance and therefore rationale to use RW when walking (pt endorses one fall PTA). He already owns a RW and verbalizes  understanding of need for use. Recommend OPPT for vestibular rehab on discharge.         If plan is discharge home, recommend the following: A little help with walking and/or transfers;A little help with bathing/dressing/bathroom;Assistance with cooking/housework;Assist for transportation;Help with stairs or ramp for entrance   Can travel by private vehicle        Equipment Recommendations None recommended by PT  Recommendations for Other Services  OT consult  for diplopia management?   Functional Status Assessment Patient has had a recent decline in their functional status and demonstrates the ability to make significant improvements in function in a reasonable and predictable amount of time.     Precautions / Restrictions Precautions Precautions: Fall Recall of Precautions/Restrictions: Intact       12/09/23 0001  Vestibular Assessment  General Observation Supine in bed. Eyes with vertical skew deviation.  Symptom Behavior  Subjective history of current problem Began several days after cervical ablation. Blurry vision; double-vision; face goes numb; swimmy-headed; nauseated to point of vomiting  Type of Dizziness  Comment (swimmy-headed)  Frequency of Dizziness every day  Duration of Dizziness hours  Symptom Nature Spontaneous  Aggravating Factors Activity in general  Relieving Factors Lying supine;Dark room  Progression of Symptoms Worse  History of similar episodes None prior to ablation. Denies h/o headaches or migraines. Denies photophobia  Oculomotor Exam  Oculomotor Alignment Abnormal (right eye elevated compared to left)  Ocular ROM WNL  Spontaneous Absent  Gaze-induced  Absent  Smooth Pursuits Intact (eyes dysconjugate however)  Vestibulo-Ocular Reflex  VOR 1 Head Only (x 1 viewing) slow speed; eyes dysconjugate throughout; increases symptoms  VOR Cancellation Comment (eyes dysconjugate but staying on target at slow speed; maximally incr symptoms)  Visual  Acuity  Static blurry; double     Mobility  Bed Mobility Overal bed mobility: Modified Independent             General bed mobility comments: HOB elevated; incr time to adjust to dizziness    Transfers Overall transfer level: Needs assistance Equipment used: None Transfers: Sit to/from Stand Sit to Stand: Contact guard assist           General transfer comment: guarding due to dizziness; h/o fall    Ambulation/Gait Ambulation/Gait assistance: Contact guard assist Gait Distance (Feet): 30 Feet Assistive device: IV Pole Gait Pattern/deviations: Step-through pattern, Decreased stride length Gait velocity: decr     General Gait Details: reaching for UE support  Stairs            Wheelchair Mobility     Tilt Bed    Modified Rankin (Stroke Patients Only)       Balance Overall balance assessment: Mild deficits observed, not formally tested                                           Pertinent Vitals/Pain Pain Assessment Pain Assessment: Faces Faces Pain Scale: Hurts even more Pain Location: neck Pain Descriptors / Indicators: Constant Pain Intervention(s): Limited activity within patient's tolerance, Monitored during session    Home Living Family/patient expects to be discharged to:: Private residence Living Arrangements: Spouse/significant other Available Help at Discharge: Family;Available 24 hours/day Type of Home: House Home Access: Level entry       Home Layout: One level Home Equipment: Agricultural consultant (2 wheels);BSC/3in1;Shower seat      Prior Function Prior Level of Function : Independent/Modified Independent;Driving;History of Falls (last six months)             Mobility Comments: one fall and several near falls since onset of vertigo       Extremity/Trunk Assessment   Upper Extremity Assessment Upper Extremity Assessment: Overall WFL for tasks assessed    Lower Extremity Assessment Lower Extremity  Assessment: Overall WFL for tasks assessed    Cervical / Trunk Assessment Cervical / Trunk Assessment: Normal  Communication   Communication Communication: No apparent difficulties    Cognition Arousal: Alert Behavior During Therapy: WFL for tasks assessed/performed   PT - Cognitive impairments: No apparent impairments                         Following commands: Intact       Cueing Cueing Techniques: Verbal cues, Visual cues     General Comments General comments (skin integrity, edema, etc.): Provided handouts for rationale for vestibular rehab; habituation; local PT for OPPT    Exercises Other Exercises Other Exercises: Educated to begin with a task that causes moderate dizziness (ex. lying supine and turning head left, right to focus on target on either side).   Assessment/Plan    PT Assessment Patient needs continued PT services  PT Problem List Decreased activity tolerance;Decreased balance;Decreased mobility;Decreased knowledge of use of DME       PT Treatment Interventions DME instruction;Gait training;Functional mobility training;Therapeutic activities;Therapeutic exercise;Balance training;Neuromuscular re-education;Patient/family education;Other (comment) (Habituation training)    PT Goals (Current goals can be found in the Care Plan section)  Acute Rehab PT Goals Patient Stated Goal: to stop feeling dizzy PT Goal Formulation: With patient Time For Goal Achievement: 12/23/23 Potential to Achieve Goals: Good  Frequency Min 2X/week     Co-evaluation               AM-PAC PT 6 Clicks Mobility  Outcome Measure Help needed turning from your back to your side while in a flat bed without using bedrails?: None Help needed moving from lying on your back to sitting on the side of a flat bed without using bedrails?: None Help needed moving to and from a bed to a chair (including a wheelchair)?: A Little Help needed standing up from a chair using  your arms (e.g., wheelchair or bedside chair)?: A Little Help needed to walk in hospital room?: A Little Help needed climbing 3-5 steps with a railing? : A Little 6 Click Score: 20    End of Session   Activity Tolerance: Treatment limited secondary to medical complications (Comment) (dizziness; double-vision) Patient left: in bed;with call bell/phone within reach;with nursing/sitter in room   PT Visit Diagnosis: Unsteadiness on feet (R26.81);History of falling (Z91.81);Dizziness and giddiness (R42)    Time: 9241-9170 PT Time Calculation (min) (ACUTE ONLY): 31 min   Charges:   PT Evaluation $PT Eval Moderate Complexity: 1 Mod PT Treatments $Neuromuscular Re-education: 8-22 mins PT General Charges $$ ACUTE PT VISIT: 1 Visit          Macario RAMAN, PT Acute Rehabilitation Services  Office [phone]   Macario SHAUNNA Soja 12/09/2023, 9:07 AM

## 2023-12-09 NOTE — Progress Notes (Signed)
 NEUROLOGY CONSULT FOLLOW UP NOTE   Date of service: December 09, 2023 Patient Name: Andrew Love MRN:  [id number] DOB:  1965-10-06  Interval Hx/subjective   Wife at bedside. Patient sitting up in bed.  Endorses left facial and arm numbness, increased in face. Denies dizziness or vertigo at this time. MAE, follows all commands.   Vitals   Vitals:   12/08/23 1945 12/09/23 0024 12/09/23 0444 12/09/23 0806  BP: 139/82 (!) 147/99 137/89 (!) 156/96  Pulse: 81 (!) 102 98 79  Resp: 17 16 17 18   Temp: 98.1 F (36.7 C) 98.5 F (36.9 C) 98 F (36.7 C) 97.7 F (36.5 C)  TempSrc:  Oral Oral   SpO2: 98% 93% 94% 94%  Weight:      Height:         Body mass index is 31.1 kg/m.  Physical Exam   Constitutional: Appears well-developed and well-nourished.   Neurologic Examination   Neuro: Mental Status: Patient is awake, alert, oriented to person, place, month, year, and situation. Patient is able to give a clear and coherent history. No signs of aphasia or neglect Cranial Nerves: II: Visual Fields are full. Pupils are equal, round, and reactive to light.   III,IV, VI: EOMI without ptosis or diploplia.  V: Facial sensation is decreased on the left, upper and lower, noted as a lot different VII: Facial movement is symmetric.  VIII: hearing is intact to voice X: Uvula elevates symmetrically XII: tongue is midline without atrophy or fasciculations.  Motor: Tone is normal. Bulk is normal. 5/5 strength was present in all four extremities.  Sensory: Sensation is decreased on the left arm, noted as a little difference Cerebellar: FNF and HKS are intact bilaterally  Medications  Current Facility-Administered Medications:    0.9 %  sodium chloride  infusion, , Intravenous, Continuous, Johnson, Clanford L, MD, Last Rate: 75 mL/hr at 12/09/23 0444, New Bag at 12/09/23 0444   acetaminophen  (TYLENOL ) tablet 650 mg, 650 mg, Oral, Q6H PRN **OR** acetaminophen  (TYLENOL ) suppository 650  mg, 650 mg, Rectal, Q6H PRN, Johnson, Clanford L, MD   artificial tears ophthalmic solution 1 drop, 1 drop, Both Eyes, BID PRN, Johnson, Clanford L, MD   bisacodyl  (DULCOLAX) EC tablet 5 mg, 5 mg, Oral, Daily PRN, Johnson, Clanford L, MD   buPROPion  (WELLBUTRIN  SR) 12 hr tablet 300 mg, 300 mg, Oral, Daily, Johnson, Clanford L, MD, 300 mg at 12/08/23 1559   enoxaparin  (LOVENOX ) injection 40 mg, 40 mg, Subcutaneous, Q24H, Johnson, Clanford L, MD, 40 mg at 12/08/23 2107   ezetimibe  (ZETIA ) tablet 10 mg, 10 mg, Oral, QHS, Johnson, Clanford L, MD, 10 mg at 12/08/23 2107   fenofibrate  tablet 160 mg, 160 mg, Oral, QHS, Johnson, Clanford L, MD, 160 mg at 12/08/23 2107   fentaNYL  (SUBLIMAZE ) injection 25 mcg, 25 mcg, Intravenous, Q2H PRN, Johnson, Clanford L, MD   insulin  aspart (novoLOG ) injection 0-20 Units, 0-20 Units, Subcutaneous, TID WC, Johnson, Clanford L, MD, 3 Units at 12/08/23 1720   insulin  aspart (novoLOG ) injection 5 Units, 5 Units, Subcutaneous, TID WC, Johnson, Clanford L, MD, 5 Units at 12/08/23 1720   levothyroxine  (SYNTHROID ) tablet 300 mcg, 300 mcg, Oral, q morning, Johnson, Clanford L, MD, 300 mcg at 12/09/23 9346   loratadine  (CLARITIN ) tablet 10 mg, 10 mg, Oral, Daily PRN, Johnson, Clanford L, MD   methocarbamol  (ROBAXIN ) tablet 500 mg, 500 mg, Oral, TID PRN, Vicci, Clanford L, MD, 500 mg at 12/09/23 0027   mirtazapine  (REMERON ) tablet 7.5 mg,  7.5 mg, Oral, QHS, Johnson, Clanford L, MD, 7.5 mg at 12/08/23 2107   olopatadine  (PATANOL) 0.1 % ophthalmic solution 1 drop, 1 drop, Both Eyes, BID PRN, Johnson, Clanford L, MD   ondansetron  (ZOFRAN ) tablet 4 mg, 4 mg, Oral, Q6H PRN **OR** ondansetron  (ZOFRAN ) injection 4 mg, 4 mg, Intravenous, Q6H PRN, Johnson, Clanford L, MD, 4 mg at 12/08/23 1600   oxyCODONE  (Oxy IR/ROXICODONE ) immediate release tablet 5 mg, 5 mg, Oral, Q4H PRN, Vicci, Clanford L, MD, 5 mg at 12/09/23 0442   pantoprazole  (PROTONIX ) EC tablet 40 mg, 40 mg, Oral, Daily,  Johnson, Clanford L, MD, 40 mg at 12/08/23 1423   prochlorperazine  (COMPAZINE ) injection 10 mg, 10 mg, Intravenous, Q4H PRN, Vicci Afton CROME, MD  Labs and Diagnostic Imaging   CBC:  Recent Labs  Lab 12/08/23 0400 12/08/23 1058 12/09/23 0159  WBC 12.3* 8.5 10.0  NEUTROABS 8.0*  --  6.3  HGB 16.2 14.5 14.9  HCT 44.5 41.5 40.6  MCV 89.0 91.4 89.6  PLT 247 176 205    Basic Metabolic Panel:  Lab Results  Component Value Date   NA 135 12/09/2023   K 3.9 12/09/2023   CO2 21 (L) 12/09/2023   GLUCOSE 261 (H) 12/09/2023   BUN 13 12/09/2023   CREATININE 1.21 12/09/2023   CALCIUM  9.0 12/09/2023   GFRNONAA >60 12/09/2023   GFRAA >60 08/06/2019   Lipid Panel:  Lab Results  Component Value Date   LDLCALC 102 (H) 06/02/2021   HgbA1c:  Lab Results  Component Value Date   HGBA1C 7.6 (H) 07/27/2022   Urine Drug Screen:     Component Value Date/Time   LABOPIA NONE DETECTED 03/21/2014 0528   COCAINSCRNUR NONE DETECTED 03/21/2014 0528   LABBENZ NONE DETECTED 03/21/2014 0528   AMPHETMU NONE DETECTED 03/21/2014 0528   THCU NONE DETECTED 03/21/2014 0528   LABBARB NONE DETECTED 03/21/2014 0528    Alcohol  Level     Component Value Date/Time   ETH <15 12/01/2023 2314   INR  Lab Results  Component Value Date   INR 0.9 12/01/2023   APTT  Lab Results  Component Value Date   APTT 27 12/01/2023   AED levels: No results found for: PHENYTOIN, ZONISAMIDE, LAMOTRIGINE, LEVETIRACETA  CT Head without contrast(Personally reviewed): Negative   CT angio Head and Neck with contrast(Personally reviewed): Moderate unilateral vertebral stenosis in  a codominant vertebral system  MRI Brain (Personally reviewed):  No acute intracranial abnormality.   Assessment   Andrew Love is a 58 y.o. male who presented 8/2 with waxing/waning dizziness, blurred vision, bilateral facial paresthesia since mid June. Patient also endorsed frequent migraines since then.   MRI  negative. Migraine cocktail was given with some relief. Discussed headache/numbness/vertigo quality, location, duration with patient and wife at bedside. With the description of neck tightness, specifically on the right side and strain on right shoulder area, headaches and vertigo could be cervicogenic in nature.   Recommendations   - Nurtec at discharge.  - Follow-up with Dr. Onita - discussed non pharmacological headache interventions to try at home   Patient is OK for discharge from neurology standpoint, with recommendations as above. Follow-up with outpatient neurology, patient has an appointment with Dr. Onita.    ______________________________________________________________________   Signed, Rocky JAYSON Likes, NP [facility]    NEUROHOSPITALIST ADDENDUM Performed a face to face diagnostic evaluation.   I have reviewed the contents of history and physical exam as documented by PA/ARNP/Resident and agree with above documentation.  I have discussed and formulated the above plan as documented. Edits to the note have been made as needed.  Jadien Lehigh, MD [facility] [phone]   If 7pm to 7am, please call on call as listed on AMION.

## 2023-12-09 NOTE — Evaluation (Signed)
 Occupational Therapy Evaluation Patient Details Name: Andrew Love MRN: [id number] DOB: January 14, 1966 Today's Date: 12/09/2023   History of Present Illness   58 year old male  presented to [facility] ED on 8/2 complaining of nausea, vomiting and vertigo symptoms with numbness from occiput down the cervical spine which was not a new findings. CT unilateral vertebral blockage; MRI negative. PMH- hypothyroidism, hypertension, OSA, CAD, low back pain status post lumbar decompression, hip replacement and shoulder arthroscopy, diverticulitis, prostate hypertrophy, status post cervical decompression surgery who reportedly in June 2025 received a cervical ablation at the [facility].  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  He has had multiple recent ED visits for recurrent nausea, vomiting, vertigo symptoms that acutely worsened after the cervical ablation in June 2025.     Clinical Impressions PTA, pt lived with wife and reports being independent in ADL, IADL, driving prior to cervical ablation. Since cervical ablation, pt has had diplopia starting 3 days after procedure. Pt predominantly limited by diplopia and dizziness on eval. Diplopia not resolved with covering one eye; opted for taping nasal portion of bil lenses of non-corrective glasses. With this facilitating eye alignment/conversion to reduce double picture. Pt to continue to benefit from OT and recommending OP OT and follow up with neuro ophthalmologist at discharge.      If plan is discharge home, recommend the following:   A little help with walking and/or transfers;A little help with bathing/dressing/bathroom;Assistance with cooking/housework;Assist for transportation;Help with stairs or ramp for entrance;Direct supervision/assist for medications management;Direct supervision/assist for financial management     Functional Status Assessment   Patient has had a recent decline in their functional status and  demonstrates the ability to make significant improvements in function in a reasonable and predictable amount of time.     Equipment Recommendations   None recommended by OT     Recommendations for Other Services         Precautions/Restrictions   Precautions Precautions: Fall Recall of Precautions/Restrictions: Intact Restrictions Weight Bearing Restrictions Per Provider Order: No     Mobility Bed Mobility Overal bed mobility: Modified Independent             General bed mobility comments: HOB elevated; incr time to adjust to dizziness    Transfers Overall transfer level: Needs assistance Equipment used: None Transfers: Sit to/from Stand Sit to Stand: Contact guard assist           General transfer comment: guarding due to dizziness; h/o fall      Balance Overall balance assessment: Mild deficits observed, not formally tested                                         ADL either performed or assessed with clinical judgement   ADL Overall ADL's : Needs assistance/impaired Eating/Feeding: Independent   Grooming: Contact guard assist;Standing   Upper Body Bathing: Modified independent;Sitting   Lower Body Bathing: Contact guard assist;Sit to/from stand   Upper Body Dressing : Modified independent;Sitting   Lower Body Dressing: Contact guard assist;Sit to/from stand   Toilet Transfer: Contact guard assist           Functional mobility during ADLs: Contact guard assist       Vision Baseline Vision/History: 1 Wears glasses Ability to See in Adequate Light: 0 Adequate Patient Visual Report: Diplopia;Other (comment) (1.5 with second image to R) Vision Assessment?: Yes Eye  Alignment: Impaired (comment) Ocular Range of Motion: Within Functional Limits Tracking/Visual Pursuits: Able to track stimulus in all quads without difficulty Convergence: Other (comment) (bil eyes converge but pt has pain with this and pt demo poor  alignment of the two together) Diplopia Assessment: Objects split side to side;Present in near gaze;Present in far gaze (worse with near gaze) Additional Comments: second picture does not disappear with one eye closed per pt. He describes as     Public relations account executive         Pertinent Vitals/Pain Pain Assessment Pain Assessment: Faces Faces Pain Scale: Hurts little more Pain Location: neck Pain Descriptors / Indicators: Constant Pain Intervention(s): Limited activity within patient's tolerance, Monitored during session     Extremity/Trunk Assessment Upper Extremity Assessment Upper Extremity Assessment: Overall WFL for tasks assessed   Lower Extremity Assessment Lower Extremity Assessment: Overall WFL for tasks assessed   Cervical / Trunk Assessment Cervical / Trunk Assessment: Normal   Communication Communication Communication: No apparent difficulties   Cognition Arousal: Alert Behavior During Therapy: WFL for tasks assessed/performed Cognition: No apparent impairments             OT - Cognition Comments: exec function not formally assessed                 Following commands: Intact       Cueing  General Comments   Cueing Techniques: Verbal cues;Visual cues  Provided handouts for rationale for vestibular rehab; habituation; local PT for OPPT   Exercises Exercises: Other exercises Other Exercises Other Exercises: significant time spent collaborating with pt to tape non-corrective lenses on nasal portion of bil eyes to reduce/eliminate experience of diplopia/seeing 1.5 pictures. At end of session, bil eyes taped nasally to promote eye alignment/convergence and pt denies diplopia with glasses donned at midline. Occasionally still gets outline off to R but vision gets worse adding additional tape   Shoulder Instructions      Home Living Family/patient expects to be discharged to:: Private residence Living Arrangements: Spouse/significant  other Available Help at Discharge: Family;Available 24 hours/day Type of Home: House Home Access: Level entry     Home Layout: One level     Bathroom Shower/Tub: Chief Strategy Officer: Handicapped height Bathroom Accessibility: Yes How Accessible: Accessible via walker;Accessible via wheelchair Home Equipment: Agricultural consultant (2 wheels);BSC/3in1;Shower seat          Prior Functioning/Environment Prior Level of Function : Independent/Modified Independent;Driving;History of Falls (last six months)             Mobility Comments: one fall and several near falls since onset of vertigo ADLs Comments: retired    OT Problem List: Impaired balance (sitting and/or standing);Impaired vision/perception   OT Treatment/Interventions: Self-care/ADL training;Therapeutic exercise;DME and/or AE instruction;Therapeutic activities;Visual/perceptual remediation/compensation;Patient/family education;Balance training      OT Goals(Current goals can be found in the care plan section)   Acute Rehab OT Goals Patient Stated Goal: get better OT Goal Formulation: With patient Time For Goal Achievement: 12/23/23 Potential to Achieve Goals: Good   OT Frequency:  Min 1X/week    Co-evaluation              AM-PAC OT 6 Clicks Daily Activity     Outcome Measure Help from another person eating meals?: None Help from another person taking care of personal grooming?: A Little Help from another person toileting, which includes using toliet, bedpan, or urinal?: A Little Help from another person  bathing (including washing, rinsing, drying)?: A Little Help from another person to put on and taking off regular upper body clothing?: A Little Help from another person to put on and taking off regular lower body clothing?: A Little 6 Click Score: 19   End of Session Nurse Communication: Mobility status  Activity Tolerance: Patient tolerated treatment well Patient left: in bed;with  call bell/phone within reach;with bed alarm set  OT Visit Diagnosis: Unsteadiness on feet (R26.81);Low vision, both eyes (H54.2)                Time: 9044-8978 OT Time Calculation (min): 26 min Charges:  OT General Charges $OT Visit: 1 Visit OT Evaluation $OT Eval Low Complexity: 1 Low OT Treatments $Therapeutic Activity: 8-22 mins  Elma JONETTA Lebron FREDERICK, OTR/L [facility] Acute Rehabilitation Office: [phone]   Elma JONETTA Lebron 12/09/2023, 12:26 PM

## 2023-12-10 ENCOUNTER — Telehealth: Payer: Self-pay | Admitting: Neurology

## 2023-12-10 LAB — HEMOGLOBIN A1C
Hgb A1c MFr Bld: 7.7 % — ABNORMAL HIGH (ref 4.8–5.6)
Mean Plasma Glucose: 174 mg/dL

## 2023-12-10 NOTE — Telephone Encounter (Signed)
 He went to the ED on 8/2 and they did the MRI there. I am cancelling our order.

## 2023-12-21 ENCOUNTER — Emergency Department (HOSPITAL_COMMUNITY)

## 2023-12-21 ENCOUNTER — Other Ambulatory Visit: Payer: Self-pay

## 2023-12-21 ENCOUNTER — Observation Stay (HOSPITAL_COMMUNITY)
Admission: EM | Admit: 2023-12-21 | Discharge: 2023-12-23 | Disposition: A | Discharge status: Home / Self Care | Attending: Internal Medicine | Admitting: Internal Medicine

## 2023-12-21 ENCOUNTER — Encounter (HOSPITAL_COMMUNITY): Payer: Self-pay | Admitting: Emergency Medicine

## 2023-12-21 DIAGNOSIS — E039 Hypothyroidism, unspecified: Secondary | ICD-10-CM | POA: Diagnosis not present

## 2023-12-21 DIAGNOSIS — Z96643 Presence of artificial hip joint, bilateral: Secondary | ICD-10-CM | POA: Diagnosis not present

## 2023-12-21 DIAGNOSIS — Z7984 Long term (current) use of oral hypoglycemic drugs: Secondary | ICD-10-CM | POA: Diagnosis not present

## 2023-12-21 DIAGNOSIS — R739 Hyperglycemia, unspecified: Secondary | ICD-10-CM

## 2023-12-21 DIAGNOSIS — R079 Chest pain, unspecified: Principal | ICD-10-CM | POA: Diagnosis present

## 2023-12-21 DIAGNOSIS — I10 Essential (primary) hypertension: Secondary | ICD-10-CM | POA: Diagnosis not present

## 2023-12-21 DIAGNOSIS — E66811 Obesity, class 1: Secondary | ICD-10-CM | POA: Diagnosis present

## 2023-12-21 DIAGNOSIS — E119 Type 2 diabetes mellitus without complications: Secondary | ICD-10-CM | POA: Diagnosis not present

## 2023-12-21 DIAGNOSIS — Z79899 Other long term (current) drug therapy: Secondary | ICD-10-CM | POA: Insufficient documentation

## 2023-12-21 DIAGNOSIS — E871 Hypo-osmolality and hyponatremia: Secondary | ICD-10-CM

## 2023-12-21 DIAGNOSIS — N179 Acute kidney failure, unspecified: Principal | ICD-10-CM | POA: Diagnosis present

## 2023-12-21 DIAGNOSIS — F17213 Nicotine dependence, cigarettes, with withdrawal: Secondary | ICD-10-CM | POA: Diagnosis not present

## 2023-12-21 DIAGNOSIS — I259 Chronic ischemic heart disease, unspecified: Secondary | ICD-10-CM | POA: Diagnosis not present

## 2023-12-21 LAB — D-DIMER, QUANTITATIVE: D-Dimer, Quant: 0.32 ug{FEU}/mL (ref 0.00–0.50)

## 2023-12-21 LAB — URINALYSIS, W/ REFLEX TO CULTURE (INFECTION SUSPECTED)
Bacteria, UA: NONE SEEN
Bilirubin Urine: NEGATIVE
Glucose, UA: >=500 mg/dL — AB
Hgb urine dipstick: NEGATIVE
Ketones, ur: NEGATIVE mg/dL
Leukocytes,Ua: NEGATIVE
Nitrite: NEGATIVE
Protein, ur: NEGATIVE mg/dL
Specific Gravity, Urine: 1.018 (ref 1.005–1.030)
pH: 5 (ref 5.0–8.0)

## 2023-12-21 LAB — CBC
HCT: 39.3 % (ref 39.0–52.0)
Hemoglobin: 14.4 g/dL (ref 13.0–17.0)
MCH: 32.7 pg (ref 26.0–34.0)
MCHC: 36.6 g/dL — ABNORMAL HIGH (ref 30.0–36.0)
MCV: 89.1 fL (ref 80.0–100.0)
Platelets: 287 K/uL (ref 150–400)
RBC: 4.41 MIL/uL (ref 4.22–5.81)
RDW: 12.3 % (ref 11.5–15.5)
WBC: 9.1 K/uL (ref 4.0–10.5)
nRBC: 0 % (ref 0.0–0.2)

## 2023-12-21 LAB — CREATININE, URINE, RANDOM: Creatinine, Urine: 152 mg/dL

## 2023-12-21 LAB — TROPONIN I (HIGH SENSITIVITY)
Troponin I (High Sensitivity): 5 ng/L (ref <18)
Troponin I (High Sensitivity): 5 ng/L (ref <18)

## 2023-12-21 LAB — GLUCOSE, CAPILLARY: Glucose-Capillary: 198 mg/dL — ABNORMAL HIGH (ref 70–99)

## 2023-12-21 LAB — BASIC METABOLIC PANEL WITH GFR
Anion gap: 16 — ABNORMAL HIGH (ref 5–15)
BUN: 31 mg/dL — ABNORMAL HIGH (ref 6–20)
CO2: 23 mmol/L (ref 22–32)
Calcium: 9.3 mg/dL (ref 8.9–10.3)
Chloride: 88 mmol/L — ABNORMAL LOW (ref 98–111)
Creatinine, Ser: 1.73 mg/dL — ABNORMAL HIGH (ref 0.61–1.24)
GFR, Estimated: 45 mL/min — ABNORMAL LOW (ref >60)
Glucose, Bld: 432 mg/dL — ABNORMAL HIGH (ref 70–99)
Potassium: 3.6 mmol/L (ref 3.5–5.1)
Sodium: 127 mmol/L — ABNORMAL LOW (ref 135–145)

## 2023-12-21 LAB — SODIUM, URINE, RANDOM: Sodium, Ur: 139 mmol/L

## 2023-12-21 LAB — TSH: TSH: 0.128 u[IU]/mL — ABNORMAL LOW (ref 0.350–4.500)

## 2023-12-21 MED ORDER — ONDANSETRON HCL 4 MG/2ML IJ SOLN
4.0000 mg | Freq: Once | INTRAMUSCULAR | Status: AC
  Administered 2023-12-21: 4 mg via INTRAVENOUS
  Filled 2023-12-21: qty 2

## 2023-12-21 MED ORDER — LEVOTHYROXINE SODIUM 50 MCG PO TABS: 50.0000 ug | ORAL_TABLET | Freq: Every day | ORAL | Status: DC

## 2023-12-21 MED ORDER — ENOXAPARIN SODIUM 60 MG/0.6ML IJ SOSY
0.5000 mg/kg | PREFILLED_SYRINGE | INTRAMUSCULAR | Status: DC
  Administered 2023-12-21: 52.5 mg via SUBCUTANEOUS
  Filled 2023-12-21: qty 0.6

## 2023-12-21 MED ORDER — POLYVINYL ALCOHOL 1.4 % OP SOLN: 1.0000 [drp] | Freq: Two times a day (BID) | OPHTHALMIC | Status: DC | PRN

## 2023-12-21 MED ORDER — GABAPENTIN 300 MG PO CAPS: 300.0000 mg | ORAL_CAPSULE | Freq: Two times a day (BID) | ORAL | Status: DC | PRN

## 2023-12-21 MED ORDER — INSULIN ASPART 100 UNIT/ML IJ SOLN
0.0000 [IU] | Freq: Three times a day (TID) | INTRAMUSCULAR | Status: DC
  Administered 2023-12-22 – 2023-12-23 (×5): 3 [IU] via SUBCUTANEOUS

## 2023-12-21 MED ORDER — DICLOFENAC SODIUM 1 % EX GEL: 1.0000 | Freq: Every day | CUTANEOUS | Status: DC | PRN

## 2023-12-21 MED ORDER — SODIUM CHLORIDE 0.9 % IV SOLN: INTRAVENOUS | Status: AC

## 2023-12-21 MED ORDER — METHOCARBAMOL 500 MG PO TABS
500.0000 mg | ORAL_TABLET | Freq: Three times a day (TID) | ORAL | Status: DC | PRN
  Administered 2023-12-22: 500 mg via ORAL
  Filled 2023-12-21: qty 1

## 2023-12-21 MED ORDER — BUPROPION HCL ER (SR) 150 MG PO TB12
300.0000 mg | ORAL_TABLET | Freq: Every day | ORAL | Status: DC
  Administered 2023-12-22 – 2023-12-23 (×2): 300 mg via ORAL
  Filled 2023-12-21 (×2): qty 2

## 2023-12-21 MED ORDER — KETOTIFEN FUMARATE 0.035 % OP SOLN: 1.0000 [drp] | Freq: Two times a day (BID) | OPHTHALMIC | Status: DC | PRN

## 2023-12-21 MED ORDER — HYDROMORPHONE HCL 1 MG/ML IJ SOLN
0.5000 mg | Freq: Once | INTRAMUSCULAR | Status: AC
  Administered 2023-12-21: 0.5 mg via INTRAVENOUS
  Filled 2023-12-21: qty 0.5

## 2023-12-21 MED ORDER — OXYCODONE HCL 5 MG PO TABS
5.0000 mg | ORAL_TABLET | Freq: Four times a day (QID) | ORAL | Status: DC | PRN
  Administered 2023-12-21 – 2023-12-23 (×5): 5 mg via ORAL
  Filled 2023-12-21 (×5): qty 1

## 2023-12-21 MED ORDER — OXYCODONE-ACETAMINOPHEN 10-325 MG PO TABS: 1.0000 | ORAL_TABLET | Freq: Four times a day (QID) | ORAL | Status: DC | PRN

## 2023-12-21 MED ORDER — LEVOTHYROXINE SODIUM 50 MCG PO TABS
350.0000 ug | ORAL_TABLET | Freq: Every day | ORAL | Status: DC
  Administered 2023-12-22 – 2023-12-23 (×2): 350 ug via ORAL
  Filled 2023-12-21 (×2): qty 7

## 2023-12-21 MED ORDER — TIZANIDINE HCL 4 MG PO TABS
4.0000 mg | ORAL_TABLET | Freq: Every day | ORAL | Status: DC
Start: 2023-12-21 — End: 2023-12-23
  Administered 2023-12-21 – 2023-12-22 (×2): 4 mg via ORAL
  Filled 2023-12-21 (×2): qty 1

## 2023-12-21 MED ORDER — HYDRALAZINE HCL 20 MG/ML IJ SOLN: 5.0000 mg | Freq: Four times a day (QID) | INTRAMUSCULAR | Status: DC | PRN

## 2023-12-21 MED ORDER — PANTOPRAZOLE SODIUM 40 MG PO TBEC
40.0000 mg | DELAYED_RELEASE_TABLET | Freq: Every day | ORAL | Status: DC
  Administered 2023-12-21 – 2023-12-23 (×3): 40 mg via ORAL
  Filled 2023-12-21 (×3): qty 1

## 2023-12-21 MED ORDER — ENOXAPARIN SODIUM 40 MG/0.4ML IJ SOSY: 40.0000 mg | PREFILLED_SYRINGE | INTRAMUSCULAR | Status: DC

## 2023-12-21 MED ORDER — METOCLOPRAMIDE HCL 5 MG/ML IJ SOLN
5.0000 mg | Freq: Four times a day (QID) | INTRAMUSCULAR | Status: DC | PRN
  Administered 2023-12-22: 5 mg via INTRAVENOUS
  Filled 2023-12-21: qty 2

## 2023-12-21 MED ORDER — MECLIZINE HCL 12.5 MG PO TABS: 12.5000 mg | ORAL_TABLET | Freq: Two times a day (BID) | ORAL | Status: DC | PRN

## 2023-12-21 MED ORDER — LINAGLIPTIN 5 MG PO TABS
5.0000 mg | ORAL_TABLET | Freq: Every day | ORAL | Status: DC
  Administered 2023-12-21 – 2023-12-23 (×3): 5 mg via ORAL
  Filled 2023-12-21 (×3): qty 1

## 2023-12-21 MED ORDER — ASPIRIN 81 MG PO CHEW
324.0000 mg | CHEWABLE_TABLET | Freq: Once | ORAL | Status: AC
  Administered 2023-12-21: 324 mg via ORAL
  Filled 2023-12-21: qty 4

## 2023-12-21 MED ORDER — IPRATROPIUM BROMIDE 0.06 % NA SOLN: 1.0000 | Freq: Three times a day (TID) | NASAL | Status: DC | PRN

## 2023-12-21 MED ORDER — OXYCODONE-ACETAMINOPHEN 5-325 MG PO TABS
1.0000 | ORAL_TABLET | Freq: Four times a day (QID) | ORAL | Status: DC | PRN
  Administered 2023-12-22 – 2023-12-23 (×4): 1 via ORAL
  Filled 2023-12-21 (×4): qty 1

## 2023-12-21 MED ORDER — SODIUM CHLORIDE 0.9 % IV BOLUS
1000.0000 mL | Freq: Once | INTRAVENOUS | Status: AC
  Administered 2023-12-21: 1000 mL via INTRAVENOUS

## 2023-12-21 MED ORDER — MIRTAZAPINE 15 MG PO TABS
7.5000 mg | ORAL_TABLET | Freq: Every day | ORAL | Status: DC
  Administered 2023-12-21 – 2023-12-22 (×2): 7.5 mg via ORAL
  Filled 2023-12-21 (×2): qty 1

## 2023-12-21 MED ORDER — INSULIN ASPART 100 UNIT/ML IJ SOLN: 0.0000 [IU] | Freq: Every day | INTRAMUSCULAR | Status: DC

## 2023-12-21 MED ORDER — OXYCODONE HCL 5 MG PO TABS
10.0000 mg | ORAL_TABLET | Freq: Once | ORAL | Status: AC
  Administered 2023-12-21: 10 mg via ORAL
  Filled 2023-12-21: qty 2

## 2023-12-21 NOTE — H&P (Addendum)
 History and Physical    Andrew Love FMW:[id number] DOB: 28-Aug-1965 DOA: 12/21/2023  PCP: [facility] (Confirm with patient/family/NH records and if not entered, this has to be entered at [facility] point of entry) Patient coming from: Home  I have personally briefly reviewed patient's old medical records in [facility]  Chief Complaint: Chest pain, dizziness, nauseous vomiting  HPI: Andrew Love is a 58 y.o. male with medical history significant of single-vessel CAD not amendable for stenting, hypothyroidism, HTN, HLD, IIDM on diet control only, OSA, BPH, chronic neck pain on NSAIDs, presented with multiple complaints including new onset of chest pain and worsening of dizziness and feeling nausea.  Patient was recently hospitalized for similar presentations of feeling nausea and intermittent vomiting as well as frequent episode of lightheadedness.  In addition, he had 1 episode of chest pain today.  [place], when this happened.  He described as pressure-like radiating to left upper arm lasted for few minutes he also started to feel palpitations, and chest pain subsided by itself without any intervention.  He was recently diagnosed with diabetes by [facility] and [facility] Jardiance  to [facility], but he has not received yet.  He also complained about numbness and tingling below bilateral knees  for some time.  He described a feeling of nausea and intermittent vomiting as  easily feel full after eat but he still tend to eat large meals late evening and for the last few nights he has been vomiting while sleeping but he reported the vomitus does not contain any undigested food or pink chunks of food.  No abdominal pain no diarrhea no fever or chills.  Last few weeks he has been having increasing feeling of thirsty and drink large amount of fluid and he also seen increasing output of urine but he described the urine color is light and he denied any flank or lower  abdominal pain no dysuria. ED Course: Afebrile, not tachycardia not hypotension O2 saturation 94% on room air.  Chest x-ray negative for acute findings blood work showed BUN 31 creatinine 1.7 compared to baseline 1.0 recently, glucose 432 bicarb 23K 3.6 WBC 9.1 hemoglobin 14.4 troponin negative x 2.  EKG showed no ischemic changes.  Patient was given oxycodone , Zofran  for his symptoms.  Review of Systems: As per HPI otherwise 14 point review of systems negative.    Past Medical History:  Diagnosis Date   Arthritis    CAD (coronary artery disease)    Chronic back pain    Chronic left hip pain    Colitis    Per medical history from dated 06/13/10.   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   Sigmoid colon ulcer    Rectal polyps   Sleep apnea with use of continuous positive airway pressure (CPAP)    Urticaria     Past Surgical History:  Procedure Laterality Date   ABLATION Right 10/2023   APPENDECTOMY     8th grade   BACK SURGERY     BIOPSY  10/17/2021   Procedure: BIOPSY;  Surgeon:  Cindie Carlin POUR, DO;  Location: [facility];  Service: Endoscopy;;   CARDIAC CATHETERIZATION  2009   CARDIAC CATHETERIZATION     Per medical history from dated 06/13/10.   CERVICAL SPINE SURGERY     C4, C5, C6 spinal fusion   CHOLECYSTECTOMY N/A 08/31/2015   Procedure: LAPAROSCOPIC CHOLECYSTECTOMY WITH INTRAOPERATIVE CHOLANGIOGRAM;  Surgeon: Morene Olives, MD;  Location: [facility];  Service: General;  Laterality: N/A;   COLONOSCOPY  07/2008   Colitis,NSAID v. Ischemia,malrotation of the gut,Diverticulosis(L),hyperplastic   COLONOSCOPY N/A 12/13/2012   Dr. Harvey: Normal TI, mild sigmoid  diverticulosis, hemorrhoids, 2 polyps (tubular adenoma). Random colon bx negative. Next TCS 12/2022 with Fentanyl /phenergan    COLONOSCOPY WITH PROPOFOL  N/A 10/17/2021   Procedure: COLONOSCOPY WITH PROPOFOL ;  Surgeon: Cindie Carlin POUR, DO;  [facility]: [facility];  Service: Endoscopy;  Laterality: N/A;  10:15am   ESOPHAGOGASTRODUODENOSCOPY N/A 04/16/2014   RMR: Erosive reflux esophagitis. Non critical Schzki's ring not manipulated. Small hiatal hernia. Abnormal gastirc mucosa of doubtful signigicance status post biopsy. I suspect trivial upper GI bleed. Recent abdominal pain presumably secondary to a protracted bout of diverticulitis. CT scan November 23 revealed improvemetn without complication. He finished his antibiotics 2 days age. Left sided ab   ESOPHAGOGASTRODUODENOSCOPY (EGD) WITH PROPOFOL  N/A 10/17/2021   Procedure: ESOPHAGOGASTRODUODENOSCOPY (EGD) WITH PROPOFOL ;  Surgeon: Cindie Carlin POUR, DO;  [facility];  Service: Endoscopy;  Laterality: N/A;   JOINT REPLACEMENT     LAPAROSCOPIC SIGMOID COLECTOMY  2012   Dr. Elon Pacini: recurrent sigmoid diverticulitis   LAPAROSCOPIC SIGMOID COLECTOMY  2012   recurernt sigmoid diverticulitis, Dr. Pacini    LEFT HEART CATH AND CORONARY ANGIOGRAPHY N/A 06/03/2021   Procedure: LEFT HEART CATH AND CORONARY ANGIOGRAPHY;  Surgeon: Wendel Lurena POUR, MD;  Location: [facility];  Service: Cardiovascular;  Laterality: N/A;   POLYPECTOMY  10/17/2021   Procedure: POLYPECTOMY;  Surgeon: Cindie Carlin POUR, DO;  Location: [facility];  Service: Endoscopy;;   ROTATOR CUFF REPAIR     Left - per medical history form dated 06/13/10.   SHOULDER SURGERY     Left   THYROIDECTOMY     Per medical history form dated 06/13/10.   TOTAL HIP ARTHROPLASTY  07/04/2011   Procedure: TOTAL HIP ARTHROPLASTY;  Surgeon: Fonda SHAUNNA Olmsted, MD;  Location: [facility];  Service: Orthopedics;  Laterality: Right;   TOTAL HIP ARTHROPLASTY Left 07/22/2019   Procedure: TOTAL  HIP ARTHROPLASTY;  Surgeon: Olmsted Fonda, MD;  Location: [facility];  Service: Orthopedics;  Laterality: Left;     reports that he quit smoking about 4 years ago. His smoking use included cigarettes. He started smoking about 22 years ago. He has a 9 pack-year smoking history. He has never used smokeless tobacco. He reports that he does not drink alcohol  and does not use drugs.  Allergies  Allergen Reactions   Dust Mite Mixed Allergen Ext [Mite (D. Farinae)] Anaphylaxis and Hives   Other Hives and Shortness Of Breath    Cockroaches   Iohexol  Hives   Almond (Diagnostic) Hives   Crestor  [Rosuvastatin ] Other (See Comments)    Myalgias  Arthralgias   Oysters [Shellfish Allergy] Hives   Wheat Hives   Yeast-Derived Drug Products Hives    Family History  Problem Relation Age of Onset   Cancer Mother        breast cancer - per medical history form dated 06/13/10.   Diverticulitis Mother    Hypertension Mother    Cancer  Father        skin - per medical history form dated 06/13/10.   Hypertension Father    Anesthesia problems Neg Hx    Hypotension Neg Hx    Malignant hyperthermia Neg Hx    Pseudochol deficiency Neg Hx    Colon cancer Neg Hx      Prior to Admission medications   Medication Sig Start Date End Date Taking? Authorizing Provider  buPROPion  (WELLBUTRIN  SR) 150 MG 12 hr tablet Take 300 mg by mouth daily. 04/14/22  Yes [provider]  CALCIUM  PO Take 1 tablet by mouth daily.   Yes [provider]  carboxymethylcellulose (REFRESH PLUS) 0.5 % SOLN Place 1 drop into both eyes 2 (two) times daily as needed (dry eyes).   Yes [provider]  chlorthalidone  (HYGROTON ) 25 MG tablet Take 25 mg by mouth daily.   Yes [provider]  Cholecalciferol (VITAMIN D3) 50 MCG (2000 UT) TABS Take 10,000 Units by mouth daily.   Yes [provider]  diclofenac  Sodium (VOLTAREN ) 1 % GEL Apply 1 Application topically daily as needed (pain).   Yes [provider]  EPINEPHrine  (EPIPEN ) 0.3 mg/0.3 mL SOAJ Inject 0.3 mLs (0.3 mg total) into the muscle as needed. 12/01/12  Yes Sheran Rogue, MD  ibuprofen  (ADVIL ) 800 MG tablet Take 800 mg by mouth every 8 (eight) hours as needed for headache or moderate pain (pain score 4-6).   Yes [provider]  ipratropium (ATROVENT ) 0.06 % nasal spray Place 1-2 sprays into both nostrils 3 (three) times daily as needed for rhinitis.   Yes [provider]  ketotifen  (ZADITOR ) 0.025 % ophthalmic solution Place 1 drop into both eyes 2 (two) times daily as needed (itchy eyes). 01/18/22  Yes [provider]  levothyroxine  (SYNTHROID ) 50 MCG tablet Take 50 mcg by mouth daily before breakfast. Take with 300 mcg   Yes [provider]  levothyroxine  (SYNTHROID , LEVOTHROID) 300 MCG tablet Take 300 mcg by mouth daily. Take with 50 mcg 03/26/14  Yes [provider]  loratadine  (CLARITIN ) 10 MG tablet Take 10 mg by mouth daily as needed for allergies. 07/18/22  Yes [provider]  losartan  (COZAAR ) 100 MG tablet Take 100 mg by mouth daily.   Yes [provider]  MAGNESIUM  PO Take 1 tablet by mouth See admin instructions. Take 1 tablet by mouth every night. May take 1 tablet twice daily if needed for muscle cramps.   Yes [provider]  Meclizine  HCl 25 MG CHEW Chew 12.5 mg by mouth 2 (two) times daily as needed (dizziness).   Yes [provider]  meloxicam (MOBIC) 15 MG tablet Take 15 mg by mouth daily as needed for pain.   Yes [provider]  methocarbamol  (ROBAXIN ) 500 MG tablet Take 500 mg by mouth 3 (three) times daily as needed for muscle spasms. 01/11/23  Yes [provider]  mirtazapine  (REMERON ) 15 MG tablet Take 7.5 mg by mouth at bedtime. 07/18/22  Yes [provider]  naloxone (NARCAN) nasal spray 4 mg/0.1 mL Place 1 spray into the nose as needed (opioid reversal). 07/18/21  Yes [provider]  Omega-3  Fatty Acids (FISH OIL PO) Take 1 capsule by mouth daily.   Yes [provider]  omeprazole  (PRILOSEC) 40 MG capsule Take 1 capsule (40 mg total) by mouth 2 (two) times daily. Patient taking differently: Take 40 mg by mouth daily. 10/17/21 02/16/24 Yes Cindie Carlin POUR, DO  ondansetron  (  ZOFRAN -ODT) 4 MG disintegrating tablet Take 1 tablet (4 mg total) by mouth every 8 (eight) hours as needed. 12/07/23  Yes Onita Duos, MD  oxyCODONE -acetaminophen  (PERCOCET) 10-325 MG tablet Take 1 tablet by mouth every 6 (six) hours as needed for pain. 12/20/23  Yes [provider]  tiZANidine  (ZANAFLEX ) 4 MG tablet Take 4 mg by mouth See admin instructions. Take 1 tablet (4mg ) by mouth at bedtime. May take 1 tablet up to 3 times daily as needed for muscle spasms 10/11/23  Yes [provider]  gabapentin  (NEURONTIN ) 300 MG capsule Take 300 mg by mouth 2 (two) times daily as needed (pain). 12/20/23   [provider]  pregabalin  (LYRICA ) 75 MG capsule Take 75 mg by mouth 2 (two) times daily as needed (neuropathy). Patient not taking: Reported on 12/21/2023    [provider]    Physical Exam: Vitals:   12/21/23 1600 12/21/23 1636 12/21/23 1733 12/21/23 1734  BP:   123/69 111/66  Pulse: 69  70 72  Resp: 14  17 12   Temp:  97.8 F (36.6 C)    TempSrc:  Oral    SpO2: 94%  93% 92%  Weight:      Height:        Constitutional: NAD, calm, comfortable Vitals:   12/21/23 1600 12/21/23 1636 12/21/23 1733 12/21/23 1734  BP:   123/69 111/66  Pulse: 69  70 72  Resp: 14  17 12   Temp:  97.8 F (36.6 C)    TempSrc:  Oral    SpO2: 94%  93% 92%  Weight:      Height:       Eyes: PERRL, lids and conjunctivae normal ENMT: Mucous membranes are moist. Posterior pharynx clear of any exudate or lesions.Normal dentition.  Neck: normal, supple, no masses, no thyromegaly Respiratory: clear to auscultation bilaterally, no wheezing, no crackles. Normal respiratory effort. No accessory  muscle use.  Cardiovascular: Regular rate and rhythm, no murmurs / rubs / gallops. No extremity edema. 2+ pedal pulses. No carotid bruits.  Abdomen: no tenderness, no masses palpated. No hepatosplenomegaly. Bowel sounds positive.  Musculoskeletal: no clubbing / cyanosis. No joint deformity upper and lower extremities. Good ROM, no contractures. Normal muscle tone.  Skin: no rashes, lesions, ulcers. No induration Neurologic: CN 2-12 grossly intact.  Decreased light touch sensation on bilateral lower extremities below the knees, DTR normal. Strength 5/5 in all 4.  Psychiatric: Normal judgment and insight. Alert and oriented x 3. Normal mood.     Labs on Admission: I have personally reviewed following labs and imaging studies  CBC: Recent Labs  Lab 12/21/23 1245  WBC 9.1  HGB 14.4  HCT 39.3  MCV 89.1  PLT 287   Basic Metabolic Panel: Recent Labs  Lab 12/21/23 1245  NA 127*  K 3.6  CL 88*  CO2 23  GLUCOSE 432*  BUN 31*  CREATININE 1.73*  CALCIUM  9.3   GFR: Estimated Creatinine Clearance: 58.8 mL/min (A) (by C-G formula based on SCr of 1.73 mg/dL (H)). Liver Function Tests: No results for input(s): AST, ALT, ALKPHOS, BILITOT, PROT, ALBUMIN in the last 168 hours. No results for input(s): LIPASE, AMYLASE in the last 168 hours. No results for input(s): AMMONIA in the last 168 hours. Coagulation Profile: No results for input(s): INR, PROTIME in the last 168 hours. Cardiac Enzymes: No results for input(s): CKTOTAL, CKMB, CKMBINDEX, TROPONINI in the last 168 hours. BNP (last 3 results) No results for input(s): PROBNP in the last 8760  hours. HbA1C: No results for input(s): HGBA1C in the last 72 hours. CBG: No results for input(s): GLUCAP in the last 168 hours. Lipid Profile: No results for input(s): CHOL, HDL, LDLCALC, TRIG, CHOLHDL, LDLDIRECT in the last 72 hours. Thyroid  Function Tests: No results for input(s): TSH,  T4TOTAL, FREET4, T3FREE, THYROIDAB in the last 72 hours. Anemia Panel: No results for input(s): VITAMINB12, FOLATE, FERRITIN, TIBC, IRON, RETICCTPCT in the last 72 hours. Urine analysis:    Component Value Date/Time   COLORURINE YELLOW 07/25/2022 1919   APPEARANCEUR CLEAR 07/25/2022 1919   LABSPEC 1.015 07/25/2022 1919   PHURINE 6.0 07/25/2022 1919   GLUCOSEU NEGATIVE 07/25/2022 1919   HGBUR NEGATIVE 07/25/2022 1919   BILIRUBINUR NEGATIVE 07/25/2022 1919   KETONESUR NEGATIVE 07/25/2022 1919   PROTEINUR NEGATIVE 07/25/2022 1919   UROBILINOGEN 0.2 03/31/2014 0150   NITRITE NEGATIVE 07/25/2022 1919   LEUKOCYTESUR NEGATIVE 07/25/2022 1919    Radiological Exams on Admission: DG Chest Port 1 View Result Date: 12/21/2023 CLINICAL DATA:  Chest pain. EXAM: PORTABLE CHEST 1 VIEW COMPARISON:  June 01, 2021. FINDINGS: The heart size and mediastinal contours are within normal limits. Both lungs are clear. The visualized skeletal structures are unremarkable. IMPRESSION: No active disease. Electronically Signed   By: Lynwood Landy Raddle M.D.   On: 12/21/2023 13:05    EKG: Independently reviewed.  Sinus rhythm, no acute ST changes.  Assessment/Plan Principal Problem:   AKI (acute kidney injury) (HCC) Active Problems:   Other chest pain   New onset type 2 diabetes mellitus (HCC)  (please populate well all problems here in Problem List. (For example, if patient is on BP meds at home and you resume or decide to hold them, it is a problem that needs to be her. Same for CAD, COPD, HLD and so on)  Chest pain - Stable angina - Review of outpatient's medical records showed that patient has history of single-vessel CAD found on the LHC 2 years ago, distal RV marginal disease that is collateralized by the left system and cardiology recommended medical therapy at that point. - At this point, troponin negative x 2 and EKG showed normal ischemic changes.  Will recycle troponin 1 more time  tomorrow morning, if troponin remain negative likely can be follow-up by cardiology outpatient for stress test.  Patient agreed with the plan. - Continue aspirin  and statin  AKI - Clinically appears to be euvolemic Check renal ultrasound and FeNa study and UA -Discontinue NSAIDs  IIDM Polydipsia and polyuria - A1c 7.6 this month, likely can go home with p.o. diabetic medications -SSI while inpatient  Chronic nausea vomiting - Clinically suspect gastroparesis - Recommend frequent small meals - [facility].  Explained to patient and his family, both agreed with the plan. - Trial of Reglan , will start as needed for now if responding, consider switch to standing dose of every 6 hours.  Probably peripheral neuropathy - Trial of gabapentin   Pseudohyponatremia - Secondary to hyperglycemia, management as above  Repeated dizziness - Has had extensive study including brain MRI on the recent admission.  Etiology was considered to related to chronic cervical spine issues.  Recommend outpatient follow-up with neurosurgery to discuss about the issue.  Continue as needed meclizine .  Total time spent on patient care 75 minutes.  DVT prophylaxis: Lovenox  Code Status: Full code Family Communication: Wife at bedside Disposition Plan: Expect less than 2 midnight hospital stay Consults called: None Admission status: Telemetry observation   Jenisa Monty T  Bettylee Feig MD [facility] Pager [phone]  12/21/2023, 7:49 PM

## 2023-12-21 NOTE — ED Provider Notes (Signed)
  Physical Exam  BP 111/66   Pulse 72   Temp 97.8 F (36.6 C) (Oral)   Resp 12   Ht 6' (1.829 m)   Wt 104 kg   SpO2 92%   BMI 31.10 kg/m   Physical Exam  Procedures  Procedures  ED Course / MDM   Clinical Course as of 12/21/23 1759  Fri Dec 21, 2023  1250 Chest x-ray interpreted by me as no acute infiltrate.  Awaiting radiology reading. [MB]    Clinical Course User Index [MB] Towana Ozell BROCKS, MD   Medical Decision Making Amount and/or Complexity of Data Reviewed Labs: ordered. Radiology: ordered.  Risk OTC drugs. Prescription drug management.   Patient with hyponatremia and increased creatinine.  Decreased oral intake.  Feeling weak.  Has had Zofran .  Headache.  I think with decreased oral intake and electrolyte abnormalities patient benefit from admission to hospital.  Continued chest pain with negative troponins.  Will discuss with hospitalist.        Patsey Lot, MD 12/21/23 [phone]

## 2023-12-21 NOTE — ED Provider Notes (Signed)
 [place] EMERGENCY DEPARTMENT AT [facility] Provider Note   CSN: [id number] Arrival date & time: 12/21/23  1150     Patient presents with: Chest Pain   Andrew Love is a 58 y.o. male.  He said he was golfing earlier today when he began experiencing substernal chest pain and shortness of breath.  Associated with some nausea and diaphoresis.  He drove himself home, was going to take a nitro but saw that they were expired.  Pain continued to wax and wane 8 out of 10 at maximum and so drove here for further evaluation.  He tells me he has a history of coronary disease with a minor blockage in one of the vessels.  Follows with the [facility].  Has noticed feeling short of breath and intermittent rapid heart rate for the last few weeks.  Former smoker.  Not on any blood thinners.   Genu varum rate  The history is provided by the patient.  Chest Pain Pain location:  Substernal area Pain quality: aching and pressure   Pain radiates to:  L shoulder Pain severity:  Severe Onset quality:  Sudden Progression:  Resolved Relieved by:  Nothing Associated symptoms: diaphoresis, nausea, shortness of breath and vomiting   Associated symptoms: no abdominal pain and no cough        Prior to Admission medications   Medication Sig Start Date End Date Taking? Authorizing Provider  buPROPion  (WELLBUTRIN  SR) 150 MG 12 hr tablet Take 300 mg by mouth daily. 04/14/22   [provider]  CALCIUM  PO Take 1 tablet by mouth daily.    [provider]  carboxymethylcellulose (REFRESH PLUS) 0.5 % SOLN Place 1 drop into both eyes 2 (two) times daily as needed (dry eyes).    [provider]  chlorthalidone  (HYGROTON ) 25 MG tablet Take 25 mg by mouth daily.    [provider]  Cholecalciferol (VITAMIN D3) 50 MCG (2000 UT) TABS Take 50 mcg by mouth daily.    [provider]  diclofenac  Sodium (VOLTAREN ) 1 % GEL Apply 1 Application topically daily as needed (pain).     [provider]  EPINEPHrine  (EPIPEN ) 0.3 mg/0.3 mL SOAJ Inject 0.3 mLs (0.3 mg total) into the muscle as needed. 12/01/12   Sheran Rogue, MD  ezetimibe  (ZETIA ) 10 MG tablet Take 10 mg by mouth at bedtime.    [provider]  fenofibrate  160 MG tablet Take 160 mg by mouth at bedtime.    [provider]  ibuprofen  (ADVIL ) 800 MG tablet Take 800 mg by mouth every 8 (eight) hours as needed for headache or moderate pain (pain score 4-6).    [provider]  ipratropium (ATROVENT ) 0.06 % nasal spray Place 1-2 sprays into both nostrils 3 (three) times daily as needed for rhinitis.    [provider]  ketotifen  (ZADITOR ) 0.025 % ophthalmic solution Place 1 drop into both eyes 2 (two) times daily as needed (itchy eyes). 01/18/22   [provider]  levothyroxine  (SYNTHROID , LEVOTHROID) 300 MCG tablet Take 300 mcg by mouth daily. 03/26/14   [provider]  loratadine  (CLARITIN ) 10 MG tablet Take 10 mg by mouth daily as needed for allergies. 07/18/22   [provider]  losartan  (COZAAR ) 100 MG tablet Take 100 mg by mouth daily.    [provider]  MAGNESIUM  PO Take 1 tablet by mouth See admin instructions. Take 1 tablet by mouth every night. May take 1 tablet twice daily if needed for  muscle cramps.    [provider]  Meclizine  HCl 25 MG CHEW Chew 12.5 mg by mouth 2 (two) times daily as needed (dizziness).    [provider]  meloxicam (MOBIC) 15 MG tablet Take 15 mg by mouth daily as needed for pain.    [provider]  methocarbamol  (ROBAXIN ) 500 MG tablet Take 500 mg by mouth 3 (three) times daily as needed for muscle spasms. 01/11/23   [provider]  mirtazapine  (REMERON ) 15 MG tablet Take 7.5 mg by mouth at bedtime. 07/18/22   [provider]  naloxone [facility]) nasal spray 4 mg/0.1 mL Place 1 spray into the nose as needed (opioid reversal). 07/18/21   [provider]  Omega-3  Fatty Acids (FISH OIL PO) Take 1 capsule by mouth daily.    [provider]  omeprazole  (PRILOSEC) 40 MG capsule Take 1 capsule (40 mg total) by mouth 2 (two) times daily. Patient taking differently: Take 40 mg by mouth daily. 10/17/21 02/16/24  Cindie Carlin POUR, DO  ondansetron  (ZOFRAN -ODT) 4 MG disintegrating tablet Take 1 tablet (4 mg total) by mouth every 8 (eight) hours as needed. 12/07/23   Onita Duos, MD  oxyCODONE -acetaminophen  (PERCOCET/ROXICET) 5-325 MG tablet Take 1 tablet by mouth 4 (four) times daily. 11/07/23   [provider]  pregabalin  (LYRICA ) 75 MG capsule Take 75 mg by mouth 2 (two) times daily as needed (neuropathy).    [provider]  Rimegepant Sulfate (NURTEC) 75 MG TBDP Take 1 tablet (75 mg total) by mouth every other day. 12/09/23   Elpidio Reyes DEL, MD  tiZANidine  (ZANAFLEX ) 4 MG tablet Take 4 mg by mouth See admin instructions. Take 1 tablet (4mg ) by mouth at bedtime. May take 1 tablet up to 3 times daily as needed for muscle spasms 10/11/23   [provider]    Allergies: Dust mite mixed allergen ext [mite (d. farinae)], Other, Iohexol , Almond (diagnostic), Crestor  [rosuvastatin ], Oysters [shellfish allergy], Wheat, and Yeast-derived drug products    Review of Systems  Constitutional:  Positive for diaphoresis.  Respiratory:  Positive for shortness of breath. Negative for cough.   Cardiovascular:  Positive for chest pain.  Gastrointestinal:  Positive for nausea and vomiting. Negative for abdominal pain.    Updated Vital Signs BP 119/82   Pulse 82   Resp 17   Ht 6' (1.829 m)   Wt 104 kg   SpO2 94%   BMI 31.10 kg/m   Physical Exam Vitals and nursing note reviewed.  Constitutional:      Appearance: He is well-developed.  HENT:     Head: Normocephalic and atraumatic.  Eyes:     Conjunctiva/sclera: Conjunctivae normal.  Cardiovascular:     Rate and Rhythm: Normal rate and regular rhythm.     Heart sounds: Normal heart  sounds. No murmur heard. Pulmonary:     Effort: Pulmonary effort is normal. No respiratory distress.     Breath sounds: Normal breath sounds.  Abdominal:     Palpations: Abdomen is soft.     Tenderness: There is no abdominal tenderness.  Musculoskeletal:     Cervical back: Neck supple.     Right lower leg: No tenderness. No edema.     Left lower leg: No tenderness. No edema.  Skin:    General: Skin is warm and dry.  Neurological:     General: No focal deficit present.     Mental Status: He is alert.     GCS: GCS eye subscore  is 4. GCS verbal subscore is 5. GCS motor subscore is 6.     (all labs ordered are listed, but only abnormal results are displayed) Labs Reviewed  BASIC METABOLIC PANEL WITH GFR - Abnormal; Notable for the following components:      Result Value   Sodium 127 (*)    Chloride 88 (*)    Glucose, Bld 432 (*)    BUN 31 (*)    Creatinine, Ser 1.73 (*)    GFR, Estimated 45 (*)    Anion gap 16 (*)    All other components within normal limits  CBC - Abnormal; Notable for the following components:   MCHC 36.6 (*)    All other components within normal limits  D-DIMER, QUANTITATIVE  TROPONIN I (HIGH SENSITIVITY)  TROPONIN I (HIGH SENSITIVITY)    EKG: EKG Interpretation Date/Time:  Friday December 21 2023 11:56:28 EDT Ventricular Rate:  89 PR Interval:  147 QRS Duration:  108 QT Interval:  346 QTC Calculation: 421 R Axis:   -1  Text Interpretation: Sinus rhythm Probable lateral infarct, old No significant change since prior 12/08/23 Confirmed by Towana Sharper [id number] on 12/21/2023 12:04:50 PM  Radiology: [facility] Chest Port 1 View Result Date: 12/21/2023 CLINICAL DATA:  Chest pain. EXAM: PORTABLE CHEST 1 VIEW COMPARISON:  June 01, 2021. FINDINGS: The heart size and mediastinal contours are within normal limits. Both lungs are clear. The visualized skeletal structures are unremarkable. IMPRESSION: No active disease. Electronically Signed   By: Lynwood Landy Raddle  M.D.   On: 12/21/2023 13:05     Procedures   Medications Ordered in the ED  aspirin  chewable tablet 324 mg (324 mg Oral Given 12/21/23 1222)  ondansetron  (ZOFRAN ) injection 4 mg (4 mg Intravenous Given 12/21/23 1246)  sodium chloride  0.9 % bolus 1,000 mL (1,000 mLs Intravenous Bolus 12/21/23 1334)  oxyCODONE  (Oxy IR/ROXICODONE ) immediate release tablet 10 mg (10 mg Oral Given 12/21/23 1535)  ondansetron  (ZOFRAN ) injection 4 mg (4 mg Intravenous Given 12/21/23 1535)    Clinical Course as of 12/21/23 1723  Fri Dec 21, 2023  1250 Chest x-ray interpreted by me as no acute infiltrate.  Awaiting radiology reading. [MB]    Clinical Course User Index [MB] Towana Sharper BROCKS, MD                                 Medical Decision Making Amount and/or Complexity of Data Reviewed Labs: ordered. Radiology: ordered.  Risk OTC drugs. Prescription drug management.   This patient complains of chest pain shortness of breath palpitations nausea vomiting; this involves an extensive number of treatment Options and is a complaint that carries with it a high risk of complications and morbidity. The differential includes ACS, PE, vascular, pneumonia, pneumothorax  I ordered, reviewed and interpreted labs, which included CBC normal, chemistries with low sodium elevated glucose elevated creatinine, troponin unremarkable D-dimer normal I ordered medication IV fluids nausea medication, oral pain medicine, oral aspirin  and reviewed PMP when indicated. I ordered imaging studies which included chest x-ray and I independently    visualized and interpreted imaging which showed no acute findings Previous records obtained and reviewed in epic including recent discharge summary Cardiac monitoring reviewed, sinus rhythm Social determinants considered, stress Critical Interventions: None  After the interventions stated above, I reevaluated the patient and found patient's pain to be improved, complaining of some  chronic orthopedic pain Admission and further testing considered, his care  is signed out to Dr. Patsey to follow-up on results of second troponin.  Will need reassessment if appropriate for outpatient follow-up with his cardiology team.      Final diagnoses:  Nonspecific chest pain  AKI (acute kidney injury) (HCC)  Hyponatremia  Hyperglycemia    ED Discharge Orders     None          Towana Ozell BROCKS, MD 12/21/23 1727

## 2023-12-21 NOTE — ED Notes (Signed)
 Pt still endorses nausea post Zofran  administration, pt also c/o HA and L sided CP--states pain 8/10. EDP made aware

## 2023-12-21 NOTE — ED Triage Notes (Signed)
 Pt to the ED POV with complaints of center/left side chest pain radiating to his left arm with shortness of breath with weakness for the past week.  Pt was on there golf [place] when symptoms escalated.

## 2023-12-21 NOTE — Progress Notes (Signed)
 PHARMACIST - PHYSICIAN COMMUNICATION  CONCERNING:  Enoxaparin  (Lovenox ) for DVT Prophylaxis    RECOMMENDATION: Patient was prescribed enoxaprin 40mg  q24 hours for VTE prophylaxis.   Filed Weights   12/21/23 1157  Weight: 104 kg (229 lb 4.5 oz)    Body mass index is 31.1 kg/m.  Estimated Creatinine Clearance: 58.8 mL/min (A) (by C-G formula based on SCr of 1.73 mg/dL (H)).   Based on [facility] policy patient is candidate for enoxaparin  0.5mg /kg TBW SQ every 24 hours based on BMI being >30.  DESCRIPTION: Pharmacy has adjusted enoxaparin  dose per [facility] policy.  Patient is now receiving enoxaparin  52.5 mg every 24 hours    Olam KANDICE Fritter, PharmD Clinical Pharmacist  12/21/2023 7:40 PM

## 2023-12-22 ENCOUNTER — Observation Stay (HOSPITAL_COMMUNITY)

## 2023-12-22 DIAGNOSIS — E871 Hypo-osmolality and hyponatremia: Secondary | ICD-10-CM | POA: Diagnosis not present

## 2023-12-22 DIAGNOSIS — E1165 Type 2 diabetes mellitus with hyperglycemia: Secondary | ICD-10-CM | POA: Diagnosis not present

## 2023-12-22 DIAGNOSIS — N179 Acute kidney failure, unspecified: Secondary | ICD-10-CM | POA: Diagnosis not present

## 2023-12-22 DIAGNOSIS — R0789 Other chest pain: Secondary | ICD-10-CM | POA: Diagnosis not present

## 2023-12-22 DIAGNOSIS — E669 Obesity, unspecified: Secondary | ICD-10-CM

## 2023-12-22 LAB — GLUCOSE, CAPILLARY
Glucose-Capillary: 240 mg/dL — ABNORMAL HIGH (ref 70–99)
Glucose-Capillary: 204 mg/dL — ABNORMAL HIGH (ref 70–99)
Glucose-Capillary: 217 mg/dL — ABNORMAL HIGH (ref 70–99)
Glucose-Capillary: 188 mg/dL — ABNORMAL HIGH (ref 70–99)

## 2023-12-22 LAB — CBC
HCT: 37.4 % — ABNORMAL LOW (ref 39.0–52.0)
Hemoglobin: 13.2 g/dL (ref 13.0–17.0)
MCH: 32 pg (ref 26.0–34.0)
MCHC: 35.3 g/dL (ref 30.0–36.0)
MCV: 90.6 fL (ref 80.0–100.0)
Platelets: 241 K/uL (ref 150–400)
RBC: 4.13 MIL/uL — ABNORMAL LOW (ref 4.22–5.81)
RDW: 12.3 % (ref 11.5–15.5)
WBC: 6.8 K/uL (ref 4.0–10.5)
nRBC: 0 % (ref 0.0–0.2)

## 2023-12-22 LAB — TROPONIN I (HIGH SENSITIVITY): Troponin I (High Sensitivity): 6 ng/L (ref <18)

## 2023-12-22 LAB — BASIC METABOLIC PANEL WITH GFR
Anion gap: 9 (ref 5–15)
BUN: 25 mg/dL — ABNORMAL HIGH (ref 6–20)
CO2: 26 mmol/L (ref 22–32)
Calcium: 8.7 mg/dL — ABNORMAL LOW (ref 8.9–10.3)
Chloride: 96 mmol/L — ABNORMAL LOW (ref 98–111)
Creatinine, Ser: 1.31 mg/dL — ABNORMAL HIGH (ref 0.61–1.24)
GFR, Estimated: >60 mL/min (ref >60)
Glucose, Bld: 246 mg/dL — ABNORMAL HIGH (ref 70–99)
Potassium: 3.6 mmol/L (ref 3.5–5.1)
Sodium: 131 mmol/L — ABNORMAL LOW (ref 135–145)

## 2023-12-22 MED ORDER — SODIUM CHLORIDE 0.9 % IV SOLN: INTRAVENOUS | Status: AC

## 2023-12-22 MED ORDER — HYDROMORPHONE HCL 1 MG/ML IJ SOLN
1.0000 mg | Freq: Once | INTRAMUSCULAR | Status: AC
  Administered 2023-12-22: 1 mg via INTRAVENOUS
  Filled 2023-12-22: qty 1

## 2023-12-22 MED ORDER — ENOXAPARIN SODIUM 60 MG/0.6ML IJ SOSY
50.0000 mg | PREFILLED_SYRINGE | INTRAMUSCULAR | Status: DC
  Administered 2023-12-22: 50 mg via SUBCUTANEOUS
  Filled 2023-12-22: qty 0.6

## 2023-12-22 MED ORDER — PREGABALIN 75 MG PO CAPS: 75.0000 mg | ORAL_CAPSULE | Freq: Two times a day (BID) | ORAL | Status: DC | PRN

## 2023-12-22 NOTE — Progress Notes (Signed)
 Patient reported feeling jumpy at times. MD Ricky made aware.

## 2023-12-22 NOTE — Plan of Care (Signed)

## 2023-12-22 NOTE — Progress Notes (Signed)
 Patient complaining of pain to lower back 10/10, patient stated that PO pain medication is not working. MD Ricky made aware.

## 2023-12-22 NOTE — Progress Notes (Signed)
 Progress Note   Patient: Andrew Love FMW:[id number] DOB: 08-22-1965 DOA: 12/21/2023     0 DOS: the patient was seen and examined on 12/22/2023   Brief hospital admission narrative: As per H&P written by Dr. Laurita on 12/21/2023 [name] is a 58 y.o. male with medical history significant of single-vessel CAD not amendable for stenting, hypothyroidism, HTN, HLD, IIDM on diet control only, OSA, BPH, chronic neck pain on NSAIDs, presented with multiple complaints including new onset of chest pain and worsening of dizziness and feeling nausea.   Patient was recently hospitalized for similar presentations of feeling nausea and intermittent vomiting as well as frequent episode of lightheadedness.  In addition, he had 1 episode of chest pain today.  He was at a golf course, when this happened.  He described as pressure-like radiating to left upper arm lasted for few minutes he also started to feel palpitations, and chest pain subsided by itself without any intervention.  He was recently diagnosed with [facility] by [facility] and [facility] Jardiance  to take, but he has not received yet.  He also complained about numbness and tingling below bilateral knees  for some time.  He described a feeling of nausea and intermittent vomiting as  easily feel full after eat but he still tend to eat large meals late evening and for the last few nights he has been vomiting while sleeping but he reported the vomitus does not contain any undigested food or pink chunks of food.  No abdominal pain no diarrhea no fever or chills.  Last few weeks he has been having increasing feeling of thirsty and drink large amount of fluid and he also seen increasing output of urine but he described the urine color is light and he denied any flank or lower abdominal pain no dysuria. ED Course: Afebrile, not tachycardia not hypotension O2 saturation 94% on room air.  Chest x-ray negative for acute findings blood work  showed BUN 31 creatinine 1.7 compared to baseline 1.0 recently, glucose 432 bicarb 23K 3.6 WBC 9.1 hemoglobin 14.4 troponin negative x 2.  EKG showed no ischemic changes.   Patient was given oxycodone , Zofran  for his symptoms.  Assessment and plan 1-acute kidney injury -In the setting of prerenal azotemia and dehydration - Patient reporting ongoing GI losses along with significant osmotic diuresis most likely in the setting of hyperglycemia. - Continue to hold nephrotoxic agents - Maintain adequate hydration/provide IV fluids - Follow renal function trend  2-type 2 diabetes mellitus with hyperglycemia - Continue sliding scale insulin  and Tradjenta  - Planning to discharge home on oral hypoglycemic agents - Education provided regarding importance of modified carbohydrate diet - Follow CBG fluctuation.   -A1c 7.7  3-chest pain/Stable angina - Negative troponin - No acute ischemic changes on EKG or telemetry - Patient with underlying history of 1 single-vessel coronary artery disease found by left heart cath 2 years ago unfortunately lesion is too distal for stenting - Continue outpatient follow-up with cardiology service - Continue aspirin  and statin.  4-nausea and vomiting/GERD - Questionable presence of gastroparesis - Continue treatment with PPI, as needed antiemetics and trial of Reglan  - Continue control of patient's CBGs and follow clinical response.  5-peripheral neuropathy - Continue Lyrica  and gabapentin .  6-hyponatremia - In the setting of dehydration and hyperglycemia - Continue CBGs stabilization and fluid resuscitation - Follow electrolytes.  7-class I obesity -Body mass index is 31.13 kg/m.  -Low-calorie diet, increase physical activity and portion  control discussed with patient.  8-chronic back pain - Continue home analgesic regimen, muscle relaxants and gabapentin /Lyrica  therapy.   Subjective:  Afebrile, currently chest pain-free no complaining of any  nausea or vomiting.  Has tolerated diet.  Overall feels better.  Complaining of back pain.  Physical Exam: Vitals:   12/21/23 2106 12/22/23 0306 12/22/23 0738 12/22/23 1216  BP: 126/70 (!) 95/55 120/69 134/77  Pulse: 68 (!) 59 60 70  Resp: 16 20 18 19   Temp: 97.7 F (36.5 C) (!) 97.5 F (36.4 C) 97.8 F (36.6 C) 97.6 F (36.4 C)  TempSrc: Oral Oral  Axillary  SpO2: 93% 92% 96% 96%  Weight:      Height:       General exam: Alert, awake, oriented x 3; complaining of back pain; no nausea or vomiting. Respiratory system: Clear to auscultation. Respiratory effort normal.  Good saturation on room air. Cardiovascular system:RRR. No rubs or gallops; no JVD. Gastrointestinal system: Abdomen is obese, nondistended, soft and nontender. No organomegaly or masses felt. Normal bowel sounds heard. Central nervous system: No focal neurological deficits. Extremities: No cyanosis or clubbing. Skin: No petechiae. Psychiatry: Judgement and insight appear normal. Mood & affect appropriate.    Data Reviewed: CBC: White blood cell 6.8, hemoglobin 13.2 and platelet count 241K Basic metabolic panel: Sodium 131, potassium 3.6, chloride 96, bicarb 26, glucose 246, BUN 25, creatinine 1.31, calcium  8.7 and GFR >60 High sensitive troponin: 5 >>5 >>6   Family Communication: No family at bedside.  Disposition: Status is: Observation The patient will require care spanning > 2 midnights and should be moved to inpatient because: Continue IV therapy and follow renal function.  Anticipating discharge home once medically stable.  Time spent: 50 minutes  Author: Eric Nunnery, MD 12/22/2023 3:02 PM  For on call review [email or web address].

## 2023-12-22 NOTE — Plan of Care (Signed)
  Problem: Education: Goal: Knowledge of General Education information will improve Description: Including pain rating scale, medication(s)/side effects and non-pharmacologic comfort measures Outcome: Progressing   Problem: Activity: Goal: Risk for activity intolerance will decrease Outcome: Progressing   Problem: Pain Managment: Goal: General experience of comfort will improve and/or be controlled Outcome: Progressing

## 2023-12-23 DIAGNOSIS — E871 Hypo-osmolality and hyponatremia: Secondary | ICD-10-CM | POA: Diagnosis not present

## 2023-12-23 DIAGNOSIS — R079 Chest pain, unspecified: Secondary | ICD-10-CM

## 2023-12-23 DIAGNOSIS — E66811 Obesity, class 1: Secondary | ICD-10-CM

## 2023-12-23 DIAGNOSIS — E119 Type 2 diabetes mellitus without complications: Secondary | ICD-10-CM

## 2023-12-23 DIAGNOSIS — N179 Acute kidney failure, unspecified: Secondary | ICD-10-CM | POA: Diagnosis not present

## 2023-12-23 DIAGNOSIS — R739 Hyperglycemia, unspecified: Secondary | ICD-10-CM

## 2023-12-23 LAB — GLUCOSE, CAPILLARY
Glucose-Capillary: 207 mg/dL — ABNORMAL HIGH (ref 70–99)
Glucose-Capillary: 206 mg/dL — ABNORMAL HIGH (ref 70–99)

## 2023-12-23 LAB — BASIC METABOLIC PANEL WITH GFR
Anion gap: 7 (ref 5–15)
BUN: 20 mg/dL (ref 6–20)
CO2: 26 mmol/L (ref 22–32)
Calcium: 8.6 mg/dL — ABNORMAL LOW (ref 8.9–10.3)
Chloride: 98 mmol/L (ref 98–111)
Creatinine, Ser: 1.27 mg/dL — ABNORMAL HIGH (ref 0.61–1.24)
GFR, Estimated: >60 mL/min (ref >60)
Glucose, Bld: 220 mg/dL — ABNORMAL HIGH (ref 70–99)
Potassium: 3.8 mmol/L (ref 3.5–5.1)
Sodium: 131 mmol/L — ABNORMAL LOW (ref 135–145)

## 2023-12-23 MED ORDER — EMPAGLIFLOZIN 10 MG PO TABS: 10.0000 mg | ORAL_TABLET | Freq: Every day | ORAL | 3 refills | Status: DC

## 2023-12-23 MED ORDER — BLOOD GLUCOSE TEST VI STRP: 1.0000 | ORAL_STRIP | Freq: Three times a day (TID) | 0 refills | Status: AC

## 2023-12-23 MED ORDER — METFORMIN HCL ER 750 MG PO TB24: 750.0000 mg | ORAL_TABLET | Freq: Every day | ORAL | 2 refills | Status: AC

## 2023-12-23 MED ORDER — CHLORTHALIDONE 25 MG PO TABS: 25.0000 mg | ORAL_TABLET | Freq: Every day | ORAL | Status: AC

## 2023-12-23 MED ORDER — BLOOD GLUCOSE MONITORING SUPPL DEVI: 1.0000 | Freq: Three times a day (TID) | 0 refills | Status: DC

## 2023-12-23 MED ORDER — LANCET DEVICE MISC: 1.0000 | Freq: Three times a day (TID) | 0 refills | Status: AC

## 2023-12-23 MED ORDER — LOSARTAN POTASSIUM 100 MG PO TABS: 100.0000 mg | ORAL_TABLET | Freq: Every day | ORAL | Status: AC

## 2023-12-23 MED ORDER — LANCETS MISC. MISC: 1.0000 | Freq: Three times a day (TID) | 0 refills | Status: AC

## 2023-12-23 NOTE — Discharge Summary (Addendum)
 Physician Discharge Summary   Patient: Andrew Love MRN: [id number] DOB: 11-14-1965  Admit date:     12/21/2023  Discharge date: 12/23/23  Discharge Physician: Eric Nunnery   PCP: [facility]   Recommendations at discharge:  Repeat basic metabolic panel to follow electrolytes and renal function Reassess blood pressure and adjust antihypertensive mL as needed Make sure patient follow-up with cardiology service as an outpatient for further evaluation and decision on a stress test. Continue close monitoring of patient's CBGs/A1c with further adjustment to hypoglycemia regimen as required. Continue assisting patient with weight loss management.  Discharge Diagnoses: Principal Problem:   AKI (acute kidney injury) (HCC) Active Problems:   Nonspecific chest pain   Class 1 obesity   New onset type 2 diabetes mellitus (HCC)   Hyponatremia   Hyperglycemia  Brief hospital admission narrative: As per H&P written by Dr. Laurita on 12/21/2023 Andrew Love is a 58 y.o. male with medical history significant of single-vessel CAD not amendable for stenting, hypothyroidism, HTN, HLD, IIDM on diet control only, OSA, BPH, chronic neck pain on NSAIDs, presented with multiple complaints including new onset of chest pain and worsening of dizziness and feeling nausea.   Patient was recently hospitalized for similar presentations of feeling nausea and intermittent vomiting as well as frequent episode of lightheadedness.  In addition, he had 1 episode of chest pain today.  [place], when this happened.  He described as pressure-like radiating to left upper arm lasted for few minutes he also started to feel palpitations, and chest pain subsided by itself without any intervention.  He was [facility] with [facility] and [facility] Jardiance  [facility], but he has not received yet.  He also complained about numbness and tingling below bilateral knees  for  some time.  He described a feeling of nausea and intermittent vomiting as  easily feel full after eat but he still tend to eat large meals late evening and for the last few nights he has been vomiting while sleeping but he reported the vomitus does not contain any undigested food or pink chunks of food.  No abdominal pain no diarrhea no fever or chills.  Last few weeks he has been having increasing feeling of thirsty and drink large amount of fluid and he also seen increasing output of urine but he described the urine color is light and he denied any flank or lower abdominal pain no dysuria. ED Course: Afebrile, not tachycardia not hypotension O2 saturation 94% on room air.  Chest x-ray negative for acute findings blood work showed BUN 31 creatinine 1.7 compared to baseline 1.0 recently, glucose 432 bicarb 23K 3.6 WBC 9.1 hemoglobin 14.4 troponin negative x 2.  EKG showed no ischemic changes.   Patient was given oxycodone , Zofran  for his symptoms.  Assessment and Plan: 1-acute kidney injury -In the setting of prerenal azotemia and dehydration - Patient reporting ongoing GI losses along with significant osmotic diuresis most likely in the setting of hyperglycemia. - Continue to minimize the use of nephrotoxic agents - Maintain adequate hydration and follow renal function trend -at discharge Cr /GFR almost completely back to his baseline.    2-type 2 diabetes mellitus with hyperglycemia - close monitoring of his CBG's and follow up with PCP recommended. - Planning to discharge home on oral hypoglycemic agents - Education provided regarding importance of modified carbohydrate diet - Follow CBG fluctuation.   -A1c 7.7  3-chest pain/Stable angina - Negative troponin - No acute ischemic changes on EKG or telemetry - Patient with underlying history of 1 single-vessel coronary artery disease found by left heart cath 2 years ago unfortunately lesion is too distal for stenting - Continue outpatient  follow-up with cardiology service - Continue aspirin  and statin.   4-nausea and vomiting/GERD - Questionable presence of gastroparesis - Continue treatment with PPI, as needed antiemetics and trial of Reglan  - Continue control of patient's CBGs and follow clinical response.   5-peripheral neuropathy - Continue Lyrica  and gabapentin .   6-hyponatremia - In the setting of dehydration and hyperglycemia - Continue CBGs stabilization and fluid resuscitation - Follow electrolytes.   7-class I obesity -Body mass index is 31.13 kg/m.  -Low-calorie diet, increase physical activity and portion control discussed with patient.   8-chronic back pain - Continue home analgesic regimen, muscle relaxants and gabapentin /Lyrica  therapy.    Consultants: None Procedures performed: See below for x-ray reports. Disposition: Home Diet recommendation: Heart healthy/modified carbohydrate diet.  DISCHARGE MEDICATION: Allergies as of 12/23/2023       Reactions   Dust Mite Mixed Allergen Ext [mite (d. Farinae)] Anaphylaxis, Hives   Other Hives, Shortness Of Breath   Cockroaches   Iohexol  Hives   Almond (diagnostic) Hives   Crestor  [rosuvastatin ] Other (See Comments)   Myalgias  Arthralgias   Oysters [shellfish Allergy] Hives   Wheat Hives   Yeast-derived Drug Products Hives        Medication List     STOP taking these medications    ibuprofen  800 MG tablet Commonly known as: ADVIL    methocarbamol  500 MG tablet Commonly known as: ROBAXIN        TAKE these medications    Blood Glucose Monitoring Suppl Devi 1 each by Does not apply route in the morning, at noon, and at bedtime. May substitute to any manufacturer covered by patient's insurance.   BLOOD GLUCOSE TEST STRIPS Strp 1 each by In Vitro route in the morning, at noon, and at bedtime. May substitute to any manufacturer covered by patient's insurance.   buPROPion  150 MG 12 hr tablet Commonly known as: WELLBUTRIN  SR Take 300  mg by mouth daily.   CALCIUM  PO Take 1 tablet by mouth daily.   carboxymethylcellulose 0.5 % Soln Commonly known as: REFRESH PLUS Place 1 drop into both eyes 2 (two) times daily as needed (dry eyes).   chlorthalidone  25 MG tablet Commonly known as: HYGROTON  Take 1 tablet (25 mg total) by mouth daily. Start taking on: December 25, 2023 What changed: These instructions start on December 25, 2023. If you are unsure what to do until then, ask your doctor or other care provider.   diclofenac  Sodium 1 % Gel Commonly known as: VOLTAREN  Apply 1 Application topically daily as needed (pain).   empagliflozin  10 MG Tabs tablet Commonly known as: Jardiance  Take 1 tablet (10 mg total) by mouth daily before breakfast.   EPINEPHrine  0.3 mg/0.3 mL Soaj injection Commonly known as: EpiPen  Inject 0.3 mLs (0.3 mg total) into the muscle as needed.   FISH OIL PO Take 1 capsule by mouth daily.   gabapentin  300 MG capsule Commonly known as: NEURONTIN  Take 300 mg by mouth 2 (two) times daily as needed (pain).   ipratropium 0.06 % nasal spray Commonly known as: ATROVENT  Place 1-2 sprays into both nostrils 3 (three) times daily as needed for rhinitis.   ketotifen  0.025 % ophthalmic solution Commonly known as: ZADITOR  Place 1 drop into both eyes  2 (two) times daily as needed (itchy eyes).   Lancet Device Misc 1 each by Does not apply route in the morning, at noon, and at bedtime. May substitute to any manufacturer covered by patient's insurance.   Lancets Misc. Misc 1 each by Does not apply route in the morning, at noon, and at bedtime. May substitute to any manufacturer covered by patient's insurance.   levothyroxine  50 MCG tablet Commonly known as: SYNTHROID  Take 50 mcg by mouth daily before breakfast. Take with 300 mcg   levothyroxine  300 MCG tablet Commonly known as: SYNTHROID  Take 300 mcg by mouth daily. Take with 50 mcg   loratadine  10 MG tablet Commonly known as: CLARITIN  Take 10 mg  by mouth daily as needed for allergies.   losartan  100 MG tablet Commonly known as: COZAAR  Take 1 tablet (100 mg total) by mouth daily. Start taking on: December 25, 2023 What changed: These instructions start on December 25, 2023. If you are unsure what to do until then, ask your doctor or other care provider.   MAGNESIUM  PO Take 1 tablet by mouth See admin instructions. Take 1 tablet by mouth every night. May take 1 tablet twice daily if needed for muscle cramps.   Meclizine  HCl 25 MG Chew Chew 12.5 mg by mouth 2 (two) times daily as needed (dizziness).   meloxicam 15 MG tablet Commonly known as: MOBIC Take 15 mg by mouth daily as needed for pain.   metFORMIN  750 MG 24 hr tablet Commonly known as: GLUCOPHAGE -XR Take 1 tablet (750 mg total) by mouth daily with breakfast.   mirtazapine  15 MG tablet Commonly known as: REMERON  Take 7.5 mg by mouth at bedtime.   naloxone 4 MG/0.1ML Liqd nasal spray kit Commonly known as: NARCAN Place 1 spray into the nose as needed (opioid reversal).   omeprazole  40 MG capsule Commonly known as: PRILOSEC Take 1 capsule (40 mg total) by mouth 2 (two) times daily. What changed: when to take this   ondansetron  4 MG disintegrating tablet Commonly known as: ZOFRAN -ODT Take 1 tablet (4 mg total) by mouth every 8 (eight) hours as needed.   oxyCODONE -acetaminophen  10-325 MG tablet Commonly known as: PERCOCET Take 1 tablet by mouth every 6 (six) hours as needed for pain.   pregabalin  75 MG capsule Commonly known as: LYRICA  Take 75 mg by mouth 2 (two) times daily as needed (neuropathy).   tiZANidine  4 MG tablet Commonly known as: ZANAFLEX  Take 4 mg by mouth See admin instructions. Take 1 tablet (4mg ) by mouth at bedtime. May take 1 tablet up to 3 times daily as needed for muscle spasms   Vitamin D3 50 MCG (2000 UT) Tabs Take 10,000 Units by mouth daily.        Follow-up Information     [facility]. Schedule an appointment as soon as  possible for a visit in 10 day(s).   Specialty: General Practice Contact information: [address] [place] [address] [phone]                Discharge Exam: Fredricka Weights   12/21/23 1157 12/21/23 2040  Weight: 104 kg 104.1 kg   General exam: Alert, awake, oriented x 3; complaining of back pain; no nausea or vomiting. Respiratory system: Clear to auscultation. Respiratory effort normal.  Good saturation on room air. Cardiovascular system:RRR. No rubs or gallops; no JVD. Gastrointestinal system: Abdomen is obese, nondistended, soft and nontender. No organomegaly or masses felt. Normal bowel sounds heard. Central nervous system: No  focal neurological deficits. Extremities: No cyanosis or clubbing. Skin: No petechiae. Psychiatry: Judgement and insight appear normal. Mood & affect appropriate.   Condition at discharge: Stable and improved.  The results of significant diagnostics from this hospitalization (including imaging, microbiology, ancillary and laboratory) are listed below for reference.   Imaging Studies: US  RENAL Result Date: 12/22/2023 EXAM: US  Retroperitoneum Complete, Renal. CLINICAL HISTORY: 409830 AKI (acute kidney injury) (HCC) 409830. AKI (acute kidney injury) (HCC) TECHNIQUE: Real-time ultrasound of the retroperitoneum (complete) with image documentation. COMPARISON: None provided. FINDINGS: RIGHT KIDNEY: The right kidney measures 11.2 x 5.4 x 5.0 cm. Estimated volume is 161 ml. No hydronephrosis, renal stone, or mass visualized. LEFT KIDNEY: The left kidney measures 11.6 x 5.6 x 5.2 cm. Estimated volume is 180 ml. No hydronephrosis, renal stone, or mass visualized. BLADDER: Unremarkable as visualized. IMPRESSION: 1. No hydronephrosis or renal stone. Electronically signed by: Lonni Necessary MD 12/22/2023 10:50 AM EDT RP Workstation: [id number]   DG Chest Port 1 View Result Date: 12/21/2023 CLINICAL DATA:  Chest pain. EXAM: PORTABLE CHEST 1 VIEW  COMPARISON:  June 01, 2021. FINDINGS: The heart size and mediastinal contours are within normal limits. Both lungs are clear. The visualized skeletal structures are unremarkable. IMPRESSION: No active disease. Electronically Signed   By: Lynwood Landy Raddle M.D.   On: 12/21/2023 13:05   MR BRAIN WO CONTRAST Result Date: 12/09/2023 EXAM: MRI BRAIN WITHOUT CONTRAST 12/08/2023 11:54:04 PM TECHNIQUE: Multiplanar multisequence MRI of the head/brain was performed without the administration of intravenous contrast. COMPARISON: 03/21/2014 CLINICAL HISTORY: Ataxia, nontraumatic, stroke excluded. FINDINGS: BRAIN AND VENTRICLES: No acute infarct. No intracranial hemorrhage. No mass. No midline shift. No hydrocephalus. The sella is unremarkable. Normal flow voids. ORBITS: No acute abnormality. SINUSES AND MASTOIDS: No acute abnormality. BONES AND SOFT TISSUES: Normal marrow signal. No acute soft tissue abnormality. IMPRESSION: 1. No acute intracranial abnormality. Electronically signed by: Franky Stanford MD 12/09/2023 12:18 AM EDT RP Workstation: [id number]   CT ANGIO HEAD NECK W WO CM W PERF (CODE STROKE) Result Date: 12/02/2023 CLINICAL DATA:  Neuro deficit, acute, stroke suspected EXAM: CT ANGIOGRAPHY HEAD AND NECK CT PERFUSION BRAIN TECHNIQUE: Multidetector CT imaging of the head and neck was performed using the standard protocol during bolus administration of intravenous contrast. Multiplanar CT image reconstructions and MIPs were obtained to evaluate the vascular anatomy. Carotid stenosis measurements (when applicable) are obtained utilizing NASCET criteria, using the distal internal carotid diameter as the denominator. Multiphase CT imaging of the brain was performed following IV bolus contrast injection. Subsequent parametric perfusion maps were calculated using RAPID software. RADIATION DOSE REDUCTION: This exam was performed according to the departmental dose-optimization program which includes automated exposure  control, adjustment of the mA and/or kV according to patient size and/or use of iterative reconstruction technique. CONTRAST:  OMNIPAQUE  IOHEXOL  350 MG/ML SOLN COMPARISON:  Same day CT head. FINDINGS: CTA NECK FINDINGS Aortic arch: Great vessel origins are patent without significant stenosis. Right carotid system: No evidence of dissection, stenosis (50% or greater) or occlusion. Left carotid system: No evidence of dissection, stenosis (50% or greater) or occlusion. Vertebral arteries: Moderate left vertebral artery origin stenosis. Otherwise, no evidence of dissection, stenosis (50% or greater) or occlusion. Skeleton: No acute abnormality on limited assessment. Other neck: No acute abnormality on limited assessment. Upper chest: Visualized lung apices are clear. Review of the MIP images confirms the above findings CTA HEAD FINDINGS Anterior circulation: Bilateral intracranial ICAs, MCAs, and ACAs are patent without proximal hemodynamically significant stenosis. Posterior  circulation: Bilateral intradural vertebral arteries, basilar artery and bilateral posterior cerebral arteries are patent without proximal hemodynamically significant stenosis. Venous sinuses: As permitted by contrast timing, patent. Review of the MIP images confirms the above findings CT Brain Perfusion Findings: ASPECTS: 10 CBF (<30%) Volume: 0mL Perfusion (Tmax>6.0s) volume: 0mL Mismatch Volume: 0mL Infarction Location:None identified. IMPRESSION: 1. No emergent large vessel occlusion. 2. Moderate left vertebral artery origin stenosis. 3. No evidence of core infarct or penumbra on CT perfusion. Electronically Signed   By: Gilmore GORMAN Molt M.D.   On: 12/02/2023 00:04   CT HEAD CODE STROKE WO CONTRAST Result Date: 12/01/2023 CLINICAL DATA:  Code stroke.  Neuro deficit, acute, stroke suspected EXAM: CT HEAD WITHOUT CONTRAST TECHNIQUE: Contiguous axial images were obtained from the base of the skull through the vertex without intravenous  contrast. RADIATION DOSE REDUCTION: This exam was performed according to the departmental dose-optimization program which includes automated exposure control, adjustment of the mA and/or kV according to patient size and/or use of iterative reconstruction technique. COMPARISON:  None Available. FINDINGS: Brain: No evidence of acute infarction, hemorrhage, hydrocephalus, extra-axial collection or mass lesion/mass effect. Vascular: No hyperdense vessel. Skull: No acute fracture. Sinuses/Orbits: Clear sinuses.  No acute orbital findings. Other: No mastoid effusions. ASPECTS [facility] Stroke Program Early CT Score) Total score (0-10 with 10 being normal): 10. IMPRESSION: No evidence of acute intracranial abnormality. ASPECTS is 10. Code stroke imaging results were communicated on 12/01/2023 at 11:31 pm to provider Dr. Lannette via telephone. Electronically Signed   By: Gilmore GORMAN Molt M.D.   On: 12/01/2023 23:32    Microbiology: Results for orders placed or performed during the hospital encounter of 07/25/22  Gastrointestinal Panel by PCR , Stool     Status: Abnormal   Collection Time: 07/26/22 10:50 AM   Specimen: STOOL  Result Value Ref Range Status   Campylobacter species DETECTED (A) NOT DETECTED Final    Comment: RESULT CALLED TO, READ BACK BY AND VERIFIED WITH: LAYMON MARINA RN [id number] 07/27/22 HNM    Plesimonas shigelloides NOT DETECTED NOT DETECTED Final   Salmonella species NOT DETECTED NOT DETECTED Final   Yersinia enterocolitica NOT DETECTED NOT DETECTED Final   Vibrio species NOT DETECTED NOT DETECTED Final   Vibrio cholerae NOT DETECTED NOT DETECTED Final   Enteroaggregative E coli (EAEC) NOT DETECTED NOT DETECTED Final   Enteropathogenic E coli (EPEC) NOT DETECTED NOT DETECTED Final   Enterotoxigenic E coli (ETEC) NOT DETECTED NOT DETECTED Final   Shiga like toxin producing E coli (STEC) NOT DETECTED NOT DETECTED Final   Shigella/Enteroinvasive E coli (EIEC) NOT DETECTED NOT  DETECTED Final   Cryptosporidium NOT DETECTED NOT DETECTED Final   Cyclospora cayetanensis NOT DETECTED NOT DETECTED Final   Entamoeba histolytica NOT DETECTED NOT DETECTED Final   Giardia lamblia NOT DETECTED NOT DETECTED Final   Adenovirus F40/41 NOT DETECTED NOT DETECTED Final   Astrovirus NOT DETECTED NOT DETECTED Final   Norovirus GI/GII NOT DETECTED NOT DETECTED Final   Rotavirus A NOT DETECTED NOT DETECTED Final   Sapovirus (I, II, IV, and V) NOT DETECTED NOT DETECTED Final    Comment: Performed at [facility], [address] [place], [place] [address]  C Difficile Quick Screen w PCR reflex     Status: None   Collection Time: 07/26/22 10:50 AM   Specimen: STOOL  Result Value Ref Range Status   C Diff antigen NEGATIVE NEGATIVE Final   C Diff toxin NEGATIVE NEGATIVE Final   C Diff interpretation  No C. difficile detected.  Final    Comment: Performed at [facility], [address] [place], [place] [address]  Resp panel by RT-PCR (RSV, Flu A&B, Covid) Anterior Nasal Swab     Status: None   Collection Time: 07/26/22 10:51 AM   Specimen: Anterior Nasal Swab  Result Value Ref Range Status   SARS Coronavirus 2 by RT PCR NEGATIVE NEGATIVE Final    Comment: (NOTE) SARS-CoV-2 target nucleic acids are NOT DETECTED.  The SARS-CoV-2 RNA is generally detectable in upper respiratory specimens during the acute phase of infection. The lowest concentration of SARS-CoV-2 viral copies this assay can detect is 138 copies/mL. A negative result does not preclude SARS-Cov-2 infection and should not be used as the sole basis for treatment or other patient management decisions. A negative result may occur with  improper specimen collection/handling, submission of specimen other than nasopharyngeal swab, presence of viral mutation(s) within the areas targeted by this assay, and inadequate number of viral copies(<138 copies/mL). A negative result must be combined with clinical  observations, patient history, and epidemiological information. The expected result is Negative.  Fact Sheet for Patients:  BloggerCourse.com  Fact Sheet for Healthcare Providers:  SeriousBroker.it  This test is no t yet approved or cleared by the [place]  FDA and  has been authorized for detection and/or diagnosis of SARS-CoV-2 by FDA under an Emergency Use Authorization (EUA). This EUA will remain  in effect (meaning this test can be used) for the duration of the COVID-19 declaration under Section 564(b)(1) of the Act, 21 U.S.C.section 360bbb-3(b)(1), unless the authorization is terminated  or revoked sooner.       Influenza A by PCR NEGATIVE NEGATIVE Final   Influenza B by PCR NEGATIVE NEGATIVE Final    Comment: (NOTE) The Xpert Xpress SARS-CoV-2/FLU/RSV plus assay is intended as an aid in the diagnosis of influenza from Nasopharyngeal swab specimens and should not be used as a sole basis for treatment. Nasal washings and aspirates are unacceptable for Xpert Xpress SARS-CoV-2/FLU/RSV testing.  Fact Sheet for Patients: BloggerCourse.com  Fact Sheet for Healthcare Providers: SeriousBroker.it  This test is not yet approved or cleared by the [place]  FDA and has been authorized for detection and/or diagnosis of SARS-CoV-2 by FDA under an Emergency Use Authorization (EUA). This EUA will remain in effect (meaning this test can be used) for the duration of the COVID-19 declaration under Section 564(b)(1) of the Act, 21 U.S.C. section 360bbb-3(b)(1), unless the authorization is terminated or revoked.     Resp Syncytial Virus by PCR NEGATIVE NEGATIVE Final    Comment: (NOTE) Fact Sheet for Patients: BloggerCourse.com  Fact Sheet for Healthcare Providers: SeriousBroker.it  This test is not yet approved or cleared by  the [place]  FDA and has been authorized for detection and/or diagnosis of SARS-CoV-2 by FDA under an Emergency Use Authorization (EUA). This EUA will remain in effect (meaning this test can be used) for the duration of the COVID-19 declaration under Section 564(b)(1) of the Act, 21 U.S.C. section 360bbb-3(b)(1), unless the authorization is terminated or revoked.  Performed at [facility], [address] [place], [place] [address]     Labs: CBC: Recent Labs  Lab 12/21/23 1245 12/22/23 0321  WBC 9.1 6.8  HGB 14.4 13.2  HCT 39.3 37.4*  MCV 89.1 90.6  PLT 287 241   Basic Metabolic Panel: Recent Labs  Lab 12/21/23 1245 12/22/23 0321 12/23/23 0325  NA 127* 131* 131*  K 3.6 3.6 3.8  CL 88*  96* 98  CO2 23 26 26   GLUCOSE 432* 246* 220*  BUN 31* 25* 20  CREATININE 1.73* 1.31* 1.27*  CALCIUM  9.3 8.7* 8.6*   CBG: Recent Labs  Lab 12/22/23 1116 12/22/23 1629 12/22/23 2129 12/23/23 0710 12/23/23 1057  GLUCAP 204* 217* 188* 207* 206*    Discharge time spent:  35 minutes.  Signed: Eric Nunnery, MD [facility] 12/23/2023

## 2024-02-09 ENCOUNTER — Other Ambulatory Visit: Payer: Self-pay

## 2024-02-09 ENCOUNTER — Emergency Department (HOSPITAL_COMMUNITY)
Admission: EM | Admit: 2024-02-09 | Discharge: 2024-02-09 | Disposition: A | Discharge status: Home / Self Care | Attending: Emergency Medicine | Admitting: Emergency Medicine

## 2024-02-09 ENCOUNTER — Emergency Department (HOSPITAL_COMMUNITY)

## 2024-02-09 ENCOUNTER — Encounter (HOSPITAL_COMMUNITY): Payer: Self-pay

## 2024-02-09 DIAGNOSIS — E039 Hypothyroidism, unspecified: Secondary | ICD-10-CM | POA: Diagnosis not present

## 2024-02-09 DIAGNOSIS — M5023 Other cervical disc displacement, cervicothoracic region: Secondary | ICD-10-CM | POA: Diagnosis not present

## 2024-02-09 DIAGNOSIS — M5126 Other intervertebral disc displacement, lumbar region: Secondary | ICD-10-CM | POA: Diagnosis not present

## 2024-02-09 DIAGNOSIS — M542 Cervicalgia: Secondary | ICD-10-CM | POA: Diagnosis present

## 2024-02-09 DIAGNOSIS — I10 Essential (primary) hypertension: Secondary | ICD-10-CM | POA: Insufficient documentation

## 2024-02-09 DIAGNOSIS — I2510 Atherosclerotic heart disease of native coronary artery without angina pectoris: Secondary | ICD-10-CM | POA: Insufficient documentation

## 2024-02-09 MED ORDER — HYDROMORPHONE HCL 1 MG/ML IJ SOLN
1.0000 mg | Freq: Once | INTRAMUSCULAR | Status: AC
  Administered 2024-02-09: 1 mg via INTRAVENOUS
  Filled 2024-02-09: qty 1

## 2024-02-09 MED ORDER — ONDANSETRON HCL 4 MG/2ML IJ SOLN
4.0000 mg | Freq: Once | INTRAMUSCULAR | Status: AC
  Administered 2024-02-09: 4 mg via INTRAVENOUS
  Filled 2024-02-09: qty 2

## 2024-02-09 MED ORDER — METHYLPREDNISOLONE 4 MG PO TBPK: ORAL_TABLET | Freq: Every day | ORAL | 0 refills | Status: DC

## 2024-02-09 MED ORDER — DEXAMETHASONE SODIUM PHOSPHATE 10 MG/ML IJ SOLN
10.0000 mg | Freq: Once | INTRAMUSCULAR | Status: AC
  Administered 2024-02-09: 10 mg via INTRAVENOUS
  Filled 2024-02-09: qty 1

## 2024-02-09 MED ORDER — LORAZEPAM 2 MG/ML IJ SOLN
1.0000 mg | Freq: Once | INTRAMUSCULAR | Status: AC | PRN
  Administered 2024-02-09: 1 mg via INTRAVENOUS
  Filled 2024-02-09: qty 1

## 2024-02-09 NOTE — ED Notes (Signed)
 Pt/family received d/c paperwork at this time. After going over the paperwork any questions, comments, or concerns were answered to the best of this nurse's knowledge. The pt/family verbally acknowledged the teachings/instructions.   D/c paperwork given to wife

## 2024-02-09 NOTE — ED Provider Notes (Signed)
 [place] EMERGENCY DEPARTMENT AT [facility] Provider Note   CSN: [id number] Arrival date & time: 02/09/24  1419     Patient presents with: Back Pain   Andrew Love is a 58 y.o. male.   Pt is a 58 yo male with pmhx significant for chronic neck/back pain, CAD, diverticulitis, colitis, htn, hypothyroidism, sleep apnea, gerd, and hld.  Pt said his back has been way worse than normal.  He said he is dragging his left leg.  He said when he turns his neck, he urinates on  himself.  He did go to his pain clinic on 10/1 and there is a plan for steroid injections on 10/6.  He is chronically on belbuca for pain.  He had been on percocet for a long time, but his pain control provider has stopped prescribing it.       Prior to Admission medications   Medication Sig Start Date End Date Taking? Authorizing Provider  methylPREDNISolone  (MEDROL  DOSEPAK) 4 MG TBPK tablet Take by mouth daily. Day 1:  2 pills at breakfast, 1 pill at lunch, 1 pill after supper, 2 pills at bedtime;Day 2:  1 pill at breakfast, 1 pill at lunch, 1 pill after supper, 2 pills at bedtime;Day 3:  1 pill at breakfast, 1 pill at lunch, 1 pill after supper, 1 pill at bedtime;Day 4:  1 pill at breakfast, 1 pill at lunch, 1 pill at bedtime;Day 5:  1 pill at breakfast, 1 pill at bedtime;Day 6:  1 pill at breakfast 02/09/24  Yes Dean Clarity, MD  Blood Glucose Monitoring Suppl DEVI 1 each by Does not apply route in the morning, at noon, and at bedtime. May substitute to any manufacturer covered by patient's insurance. 12/23/23   Ricky Fines, MD  buPROPion  (WELLBUTRIN  SR) 150 MG 12 hr tablet Take 300 mg by mouth daily. 04/14/22   [provider]  CALCIUM  PO Take 1 tablet by mouth daily.    [provider]  carboxymethylcellulose (REFRESH PLUS) 0.5 % SOLN Place 1 drop into both eyes 2 (two) times daily as needed (dry eyes).    [provider]  chlorthalidone  (HYGROTON ) 25 MG tablet Take 1 tablet  (25 mg total) by mouth daily. 12/25/23   Ricky Fines, MD  Cholecalciferol (VITAMIN D3) 50 MCG (2000 UT) TABS Take 10,000 Units by mouth daily.    [provider]  diclofenac  Sodium (VOLTAREN ) 1 % GEL Apply 1 Application topically daily as needed (pain).    [provider]  empagliflozin  (JARDIANCE ) 10 MG TABS tablet Take 1 tablet (10 mg total) by mouth daily before breakfast. 12/23/23   Ricky Fines, MD  EPINEPHrine  (EPIPEN ) 0.3 mg/0.3 mL SOAJ Inject 0.3 mLs (0.3 mg total) into the muscle as needed. 12/01/12   Sheran Rogue, MD  gabapentin  (NEURONTIN ) 300 MG capsule Take 300 mg by mouth 2 (two) times daily as needed (pain). 12/20/23   [provider]  Glucose Blood (BLOOD GLUCOSE TEST STRIPS) STRP 1 each by In Vitro route in the morning, at noon, and at bedtime. May substitute to any manufacturer covered by patient's insurance. 12/23/23 03/22/24  Ricky Fines, MD  ipratropium (ATROVENT ) 0.06 % nasal spray Place 1-2 sprays into both nostrils 3 (three) times daily as needed for rhinitis.    [provider]  ketotifen  (ZADITOR ) 0.025 % ophthalmic solution Place 1 drop into both eyes 2 (two) times daily as needed (itchy eyes). 01/18/22   [provider]  levothyroxine  (SYNTHROID ) 50 MCG  tablet Take 50 mcg by mouth daily before breakfast. Take with 300 mcg    [provider]  levothyroxine  (SYNTHROID , LEVOTHROID) 300 MCG tablet Take 300 mcg by mouth daily. Take with 50 mcg 03/26/14   [provider]  loratadine  (CLARITIN ) 10 MG tablet Take 10 mg by mouth daily as needed for allergies. 07/18/22   [provider]  losartan  (COZAAR ) 100 MG tablet Take 1 tablet (100 mg total) by mouth daily. 12/25/23   Ricky Fines, MD  MAGNESIUM  PO Take 1 tablet by mouth See admin instructions. Take 1 tablet by mouth every night. May take 1 tablet twice daily if needed for muscle cramps.    [provider]  Meclizine  HCl 25 MG CHEW Chew 12.5 mg by  mouth 2 (two) times daily as needed (dizziness).    [provider]  meloxicam (MOBIC) 15 MG tablet Take 15 mg by mouth daily as needed for pain.    [provider]  metFORMIN  (GLUCOPHAGE -XR) 750 MG 24 hr tablet Take 1 tablet (750 mg total) by mouth daily with breakfast. 12/23/23   Ricky Fines, MD  mirtazapine  (REMERON ) 15 MG tablet Take 7.5 mg by mouth at bedtime. 07/18/22   [provider]  naloxone [facility]) nasal spray 4 mg/0.1 mL Place 1 spray into the nose as needed (opioid reversal). 07/18/21   [provider]  Omega-3 Fatty Acids (FISH OIL PO) Take 1 capsule by mouth daily.    [provider]  omeprazole  (PRILOSEC) 40 MG capsule Take 1 capsule (40 mg total) by mouth 2 (two) times daily. Patient taking differently: Take 40 mg by mouth daily. 10/17/21 02/16/24  Cindie Carlin POUR, DO  ondansetron  (ZOFRAN -ODT) 4 MG disintegrating tablet Take 1 tablet (4 mg total) by mouth every 8 (eight) hours as needed. 12/07/23   Onita Duos, MD  oxyCODONE -acetaminophen  (PERCOCET) 10-325 MG tablet Take 1 tablet by mouth every 6 (six) hours as needed for pain. 12/20/23   [provider]  pregabalin  (LYRICA ) 75 MG capsule Take 75 mg by mouth 2 (two) times daily as needed (neuropathy). Patient not taking: Reported on 12/21/2023    [provider]  tiZANidine  (ZANAFLEX ) 4 MG tablet Take 4 mg by mouth See admin instructions. Take 1 tablet (4mg ) by mouth at bedtime. May take 1 tablet up to 3 times daily as needed for muscle spasms 10/11/23   [provider]    Allergies: Dust mite mixed allergen ext [mite (d. farinae)], Other, Iohexol , Almond (diagnostic), Crestor  [rosuvastatin ], Oysters [shellfish allergy], Wheat, and Yeast-derived drug products    Review of Systems  Musculoskeletal:  Positive for back pain and neck pain.  Neurological:  Positive for weakness.  All other systems reviewed and are negative.   Updated Vital Signs BP 135/81   Pulse  63   Temp 98.3 F (36.8 C) (Oral)   Resp 18   Wt 99.3 kg   SpO2 94%   BMI 29.70 kg/m   Physical Exam Vitals and nursing note reviewed.  Constitutional:      Appearance: Normal appearance. He is obese.  HENT:     Head: Normocephalic and atraumatic.     Right Ear: External ear normal.     Left Ear: External ear normal.     Nose: Nose normal.     Mouth/Throat:     Mouth: Mucous membranes are moist.     Pharynx: Oropharynx is clear.  Eyes:     Extraocular Movements: Extraocular movements intact.  Conjunctiva/sclera: Conjunctivae normal.     Pupils: Pupils are equal, round, and reactive to light.  Cardiovascular:     Rate and Rhythm: Normal rate and regular rhythm.     Pulses: Normal pulses.     Heart sounds: Normal heart sounds.  Pulmonary:     Effort: Pulmonary effort is normal.     Breath sounds: Normal breath sounds.  Abdominal:     General: Abdomen is flat. Bowel sounds are normal.     Palpations: Abdomen is soft.  Musculoskeletal:        General: Normal range of motion.     Cervical back: Normal range of motion and neck supple.  Skin:    General: Skin is warm.     Capillary Refill: Capillary refill takes less than 2 seconds.  Neurological:     General: No focal deficit present.     Mental Status: He is alert and oriented to person, place, and time.  Psychiatric:        Mood and Affect: Mood normal.        Behavior: Behavior normal.     (all labs ordered are listed, but only abnormal results are displayed) Labs Reviewed - No data to display  EKG: None  Radiology: MR LUMBAR SPINE WO CONTRAST Result Date: 02/09/2024 EXAM: MRI THORACIC AND LUMBAR SPINE WITHOUT INTRAVENOUS CONTRAST 02/09/2024 05:41:50 PM TECHNIQUE: Multiplanar multisequence MRI of the thoracic and lumbar spine was performed without the administration of intravenous contrast. COMPARISON: Lumbar spine MRI 01/21/2016. CLINICAL HISTORY: Low back pain, cauda equina syndrome suspected. Patient  reports chronic back and neck pain, severe pain, a fall today with worsening pain, and urinary incontinence when twisting his neck. FINDINGS: BONES AND ALIGNMENT: Normal alignment. Normal vertebral body heights. Bone marrow signal is unremarkable. No abnormal enhancement. SPINAL CORD: Normal spinal cord size. Normal spinal cord signal. SOFT TISSUES: Unremarkable. THORACIC DISC LEVELS: No significant disc herniation. No spinal canal stenosis or neural foraminal narrowing. LUMBAR DISC LEVELS: L1-L2: No significant disc herniation. No spinal canal stenosis or neural foraminal narrowing. L2-L3: No significant disc herniation. No spinal canal stenosis or neural foraminal narrowing. L3-L4: Left foraminal disc protrusion with annular fissure mildly narrowing the left lateral recess and left neural foramen. This could irritate the exiting left L3 nerve root. L4-L5: Left foraminal disc protrusion with mild left foraminal stenosis. This could irritate the exiting left L4 nerve root. No spinal canal stenosis. L5-S1: No significant disc herniation. No spinal canal stenosis or neural foraminal narrowing. IMPRESSION: 1. No cauda equina impingement 2. L3-4 and L4-5 left foraminal disc protrusions with mild foraminal and lateral recess narrowing, potentially affecting the exiting left L3 and L4 nerve roots. 3. Normal thoracic spine. Electronically signed by: Franky Stanford MD 02/09/2024 07:12 PM EDT RP Workstation: [id number]   MR THORACIC SPINE WO CONTRAST Result Date: 02/09/2024 EXAM: MRI THORACIC AND LUMBAR SPINE WITHOUT INTRAVENOUS CONTRAST 02/09/2024 05:41:50 PM TECHNIQUE: Multiplanar multisequence MRI of the thoracic and lumbar spine was performed without the administration of intravenous contrast. COMPARISON: Lumbar spine MRI 01/21/2016. CLINICAL HISTORY: Low back pain, cauda equina syndrome suspected. Patient reports chronic back and neck pain, severe pain, a fall today with worsening pain, and urinary incontinence when  twisting his neck. FINDINGS: BONES AND ALIGNMENT: Normal alignment. Normal vertebral body heights. Bone marrow signal is unremarkable. No abnormal enhancement. SPINAL CORD: Normal spinal cord size. Normal spinal cord signal. SOFT TISSUES: Unremarkable. THORACIC DISC LEVELS: No significant disc herniation. No spinal canal stenosis or neural foraminal narrowing. LUMBAR  DISC LEVELS: L1-L2: No significant disc herniation. No spinal canal stenosis or neural foraminal narrowing. L2-L3: No significant disc herniation. No spinal canal stenosis or neural foraminal narrowing. L3-L4: Left foraminal disc protrusion with annular fissure mildly narrowing the left lateral recess and left neural foramen. This could irritate the exiting left L3 nerve root. L4-L5: Left foraminal disc protrusion with mild left foraminal stenosis. This could irritate the exiting left L4 nerve root. No spinal canal stenosis. L5-S1: No significant disc herniation. No spinal canal stenosis or neural foraminal narrowing. IMPRESSION: 1. No cauda equina impingement 2. L3-4 and L4-5 left foraminal disc protrusions with mild foraminal and lateral recess narrowing, potentially affecting the exiting left L3 and L4 nerve roots. 3. Normal thoracic spine. Electronically signed by: Franky Stanford MD 02/09/2024 07:12 PM EDT RP Workstation: [id number]   MR Cervical Spine Wo Contrast Result Date: 02/09/2024 CLINICAL DATA:  Provided history: Myelopathy, acute, cervical spine. Additional history provided: Chronic back and neck pain fall today. Loss of bladder control when twisting neck. EXAM: MRI CERVICAL SPINE WITHOUT CONTRAST TECHNIQUE: Multiplanar, multisequence MR imaging of the cervical spine was performed. No intravenous contrast was administered. COMPARISON:  Cervical spine MRI 04/22/2020. FINDINGS: Alignment: Straightening of the expected cervical lordosis. Slight C2-C3 grade 1 anterolisthesis. Vertebrae: Prior C4-C6 ACDF. Susceptibility artifact arising from  the fusion hardware. No significant marrow edema or focal worrisome marrow lesion. Cord: Mild spinal cord flattening at C3-C4 as described below. No spinal cord signal abnormality. Posterior Fossa, vertebral arteries, paraspinal tissues: No abnormality identified within included portions of the posterior fossa. Flow voids preserved within visible portions of the cervical vertebral arteries. No paraspinal mass or collection. Disc levels: Unless otherwise stated, the level by level findings below have not significantly changed from the prior MRI of 04/22/2020. Disc degeneration at the non-operative levels, greatest at C3-C4, C6-C7 and C7-T1 (moderate at these levels). C7-T1 disc degeneration has progressed since the prior MRI of 04/22/2020. C2-C3: Slight grade 1 anterolisthesis. Shallow disc bulge. Uncovertebral hypertrophy on the left. Moderate facet arthropathy on the left. No significant spinal canal stenosis. Moderate left neural foraminal narrowing, progressed. C3-C4: Posterior disc osteophyte complex. Uncovertebral hypertrophy on the right, progressed. The left center disc protrusion previously demonstrated at this level is no longer appreciated. The posterior disc osteophyte complex results in mild-to-moderate spinal canal stenosis (with mild flattening the ventral spinal cord). Moderate right neural foraminal narrowing, progressed. C4-C5: Prior ACDF. No significant spinal canal or foraminal stenosis. C5-C6: Prior ACDF. No significant spinal canal stenosis. Uncovertebral hypertrophy results in minimal relative right neural foraminal narrowing. C6-C7: Disc bulge with right greater than left disc osteophyte ridge/uncinate hypertrophy, progressed. Mild relative spinal canal stenosis. Bilateral neural foraminal narrowing (severe right, moderate left), progressed. C7-T1: Broad-based right center/foraminal disc protrusion. Minimal effacement of the ventral thecal sac to the right (without significant spinal canal  stenosis). Persistent mild-to-moderate right neural foraminal narrowing. IMPRESSION: 1. Prior C4-C6 ACDF. No significant spinal canal stenosis at these levels. Unchanged mild residual right neural foraminal narrowing at C5-C6 (due to uncovertebral hypertrophy). 2. Background cervical spondylosis as outlined within the body of the report and having progressed at multiple levels since the prior MRI of 04/22/2020. 3. At C3-C4, a previously demonstrated left center disc protrusion is no longer present. A posterior disc osteophyte complex results in mild-to-moderate spinal canal stenosis (with mild flattening of the ventral spinal cord), similar to the prior MRI. Progressive right-sided uncovertebral hypertrophy contributes to progressive moderate right neural foraminal narrowing. 4. No more than mild relative  spinal canal stenosis at the remaining levels. 5. Additional sites of foraminal stenosis, greatest on the left at C2-C3 (moderate) and bilaterally at C6-C7 (severe right, moderate left). 6. Mild C2-C3 grade 1 anterolisthesis, unchanged. Electronically Signed   By: Rockey Childs D.O.   On: 02/09/2024 18:13     Procedures   Medications Ordered in the ED  HYDROmorphone  (DILAUDID ) injection 1 mg (1 mg Intravenous Given 02/09/24 1541)  ondansetron  (ZOFRAN ) injection 4 mg (4 mg Intravenous Given 02/09/24 1541)  LORazepam  (ATIVAN ) injection 1 mg (1 mg Intravenous Given 02/09/24 1652)  dexamethasone  (DECADRON ) injection 10 mg (10 mg Intravenous Given 02/09/24 1558)  HYDROmorphone  (DILAUDID ) injection 1 mg (1 mg Intravenous Given 02/09/24 1655)  ondansetron  (ZOFRAN ) injection 4 mg (4 mg Intravenous Given 02/09/24 1831)  HYDROmorphone  (DILAUDID ) injection 1 mg (1 mg Intravenous Given 02/09/24 1833)  HYDROmorphone  (DILAUDID ) injection 1 mg (1 mg Intravenous Given 02/09/24 1941)                                    Medical Decision Making Amount and/or Complexity of Data Reviewed Radiology:  ordered.  Risk Prescription drug management.   This patient presents to the ED for concern of weakness, urinary incont, this involves an extensive number of treatment options, and is a complaint that carries with it a high risk of complications and morbidity.  The differential diagnosis includes cauda equina   Co morbidities that complicate the patient evaluation  chronic neck/back pain, CAD, diverticulitis, colitis, htn, hypothyroidism, sleep apnea, gerd, and hld   Additional history obtained:  Additional history obtained from epic chart review    Imaging Studies ordered:  I ordered imaging studies including mri cervical, thoracic, lumbar  I independently visualized and interpreted imaging which showed  MRI cervical:  Prior C4-C6 ACDF. No significant spinal canal stenosis at these  levels. Unchanged mild residual right neural foraminal narrowing at  C5-C6 (due to uncovertebral hypertrophy).  2. Background cervical spondylosis as outlined within the body of  the report and having progressed at multiple levels since the prior  MRI of 04/22/2020.  3. At C3-C4, a previously demonstrated left center disc protrusion  is no longer present. A posterior disc osteophyte complex results in  mild-to-moderate spinal canal stenosis (with mild flattening of the  ventral spinal cord), similar to the prior MRI. Progressive  right-sided uncovertebral hypertrophy contributes to progressive  moderate right neural foraminal narrowing.  4. No more than mild relative spinal canal stenosis at the remaining  levels.  5. Additional sites of foraminal stenosis, greatest on the left at  C2-C3 (moderate) and bilaterally at C6-C7 (severe right, moderate  left).  6. Mild C2-C3 grade 1 anterolisthesis, unchanged.  MRI thoracic: 1. No cauda equina impingement  2. L3-4 and L4-5 left foraminal disc protrusions with mild foraminal and  lateral recess narrowing, potentially affecting the exiting left L3  and L4  nerve roots.  3. Normal thoracic spine.    Electronically signed by: Lennette FERNS agree with the radiologist interpretation   Medicines ordered and prescription drug management:  I ordered medication including dilaudid   for sx  Reevaluation of the patient after these medicines showed that the patient improved I have reviewed the patients home medicines and have made adjustments as needed   Test Considered:  mri   Critical Interventions:  Pain control   Problem List / ED Course:  Multiple herniated discs:  pain  has improved.  Pt is at a pain clinic, so I can't rx narcotics.  He is d/c with a medrol  dose pack.  He's seen Dr. Onetha in the past and has asked to see him again.  I have put in a referral, but I am not sure if they take referrals from us ; it may need to be his pcp and I told the pt this.  Pt has no evidence of cauda equina.  He is stable for d/c.  Return if worse.    Reevaluation:  After the interventions noted above, I reevaluated the patient and found that they have :improved   Social Determinants of Health:  Lives at home   Dispostion:  After consideration of the diagnostic results and the patients response to treatment, I feel that the patent would benefit from discharge with outpatient f/u.       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

## 2024-02-09 NOTE — ED Triage Notes (Signed)
 Pt reports he has chronic back and neck pain and has been in severe pain.  Pt reports the pain made him fall today around 1130 and now the pain is worse and states he urinates on himself when he twists his neck.

## 2024-02-12 DIAGNOSIS — M5440 Lumbago with sciatica, unspecified side: Secondary | ICD-10-CM | POA: Diagnosis not present

## 2024-02-12 DIAGNOSIS — Z6830 Body mass index (BMI) 30.0-30.9, adult: Secondary | ICD-10-CM | POA: Diagnosis not present

## 2024-02-12 DIAGNOSIS — M542 Cervicalgia: Secondary | ICD-10-CM | POA: Diagnosis not present

## 2024-03-04 ENCOUNTER — Encounter (HOSPITAL_COMMUNITY): Payer: Self-pay

## 2024-03-04 NOTE — Pre-Procedure Instructions (Signed)
 Surgical Instructions   Your procedure is scheduled on March 10, 2024. Report to [facility] Main Entrance A at 12:00 A.M., then check in with the Admitting office. Any questions or running late day of surgery: call [phone]  Questions prior to your surgery date: call [phone], Monday-Friday, 8am-4pm. If you experience any cold or flu symptoms such as cough, fever, chills, shortness of breath, etc. between now and your scheduled surgery, please notify us  at the above number.     Remember:  Do not eat after midnight the night before your surgery  You may drink clear liquids until 11:00AM the morning of your surgery.   Clear liquids allowed are: Water , Non-Citrus Juices (without pulp), Carbonated Beverages, Clear Tea (no milk, honey, etc.), Black Coffee Only (NO MILK, CREAM OR POWDERED CREAMER of any kind), and Gatorade.    Take these medicines the morning of surgery with A SIP OF WATER :  gabapentin  (NEURONTIN   levothyroxine  (SYNTHROID , LEVOTHROID)  pantoprazole  (PROTONIX )  tamsulosin  (FLOMAX )   May take these medicines IF NEEDED: diphenhydrAMINE  (BENADRYL   Meclizine  HCl  ondansetron  (ZOFRAN -ODT)  tapentadol (NUCYNTA)  traMADol (ULTRAM)   One week prior to surgery, STOP taking any Aspirin  (unless otherwise instructed by your surgeon) Aleve , Naproxen , Ibuprofen , Motrin , Advil , Goody's, BC's, all herbal medications, fish oil, and non-prescription vitamins.          WHAT DO I DO ABOUT MY DIABETES MEDICATION?   Do not take oral diabetes medicines (pills) the morning of surgery.DO NOT TAKE Metformin  (Glucophage ) the day of surgery.   HOLD empagliflozin  (JARDIANCE ) for 72 hours prior to surgery. DO NOT take after 04/06/24   HOW TO MANAGE YOUR DIABETES BEFORE AND AFTER SURGERY  Why is it important to control my blood sugar before and after surgery? Improving blood sugar levels before and after surgery helps healing and can limit problems. A way of improving blood sugar  control is eating a healthy diet by:  Eating less sugar and carbohydrates  Increasing activity/exercise  Talking with your doctor about reaching your blood sugar goals High blood sugars (greater than 180 mg/dL) can raise your risk of infections and slow your recovery, so you will need to focus on controlling your diabetes during the weeks before surgery. Make sure that the doctor who takes care of your diabetes knows about your planned surgery including the date and location.  How do I manage my blood sugar before surgery? Check your blood sugar at least 4 times a day, starting 2 days before surgery, to make sure that the level is not too high or low.  Check your blood sugar the morning of your surgery when you wake up and every 2 hours until you get to the Short Stay unit.  If your blood sugar is less than 70 mg/dL, you will need to treat for low blood sugar: Do not take insulin . Treat a low blood sugar (less than 70 mg/dL) with  cup of clear juice (cranberry or apple), 4 glucose tablets, OR glucose gel. Recheck blood sugar in 15 minutes after treatment (to make sure it is greater than 70 mg/dL). If your blood sugar is not greater than 70 mg/dL on recheck, call [phone] for further instructions. Report your blood sugar to the short stay nurse when you get to Short Stay.  If you are admitted to the hospital after surgery: Your blood sugar will be checked by the staff and you will probably be given insulin  after surgery (instead of oral diabetes medicines) to make sure you  have good blood sugar levels. The goal for blood sugar control after surgery is 80-180 mg/dL.             Do NOT Smoke (Tobacco/Vaping) for 24 hours prior to your procedure.  If you use a CPAP at night, you may bring your mask/headgear for your overnight stay.   You will be asked to remove any contacts, glasses, piercing's, hearing aid's, dentures/partials prior to surgery. Please bring cases for these items if  needed.    Patients discharged the day of surgery will not be allowed to drive home, and someone needs to stay with them for 24 hours.  SURGICAL WAITING ROOM VISITATION Patients may have no more than 2 support people in the waiting area - these visitors may rotate.   Pre-op nurse will coordinate an appropriate time for 1 ADULT support person, who may not rotate, to accompany patient in pre-op.  Children under the age of 84 must have an adult with them who is not the patient and must remain in the main waiting area with an adult.  If the patient needs to stay at the hospital during part of their recovery, the visitor guidelines for inpatient rooms apply.  Please refer to the [facility] website for the visitor guidelines for any additional information.   If you received a COVID test during your pre-op visit  it is requested that you wear a mask when out in public, stay away from anyone that may not be feeling well and notify your surgeon if you develop symptoms. If you have been in contact with anyone that has tested positive in the last 10 days please notify you surgeon.      Pre-operative 4 CHG Bathing Instructions   You can play a key role in reducing the risk of infection after surgery. Your skin needs to be as free of germs as possible. You can reduce the number of germs on your skin by washing with CHG (chlorhexidine  gluconate) soap before surgery. CHG is an antiseptic soap that kills germs and continues to kill germs even after washing.   DO NOT use if you have an allergy to chlorhexidine /CHG or antibacterial soaps. If your skin becomes reddened or irritated, stop using the CHG and notify one of our RNs at [phone].   Please shower with the CHG soap starting 4 days before surgery using the following schedule:     Please keep in mind the following:  DO NOT shave, including legs and underarms, starting the day of your first shower.   You may shave your face at any point  before/day of surgery.  Place clean sheets on your bed the day you start using CHG soap. Use a clean washcloth (not used since being washed) for each shower. DO NOT sleep with pets once you start using the CHG.   CHG Shower Instructions:  Wash your face and private area with normal soap. If you choose to wash your hair, wash first with your normal shampoo.  After you use shampoo/soap, rinse your hair and body thoroughly to remove shampoo/soap residue.  Turn the water  OFF and apply  bottle of CHG soap to a CLEAN washcloth.  Apply CHG soap ONLY FROM YOUR NECK DOWN TO YOUR TOES (washing for 3-5 minutes)  DO NOT use CHG soap on face, private areas, open wounds, or sores.  Pay special attention to the area where your surgery is being performed.  If you are having back surgery, having someone wash your back for  you may be helpful. Wait 2 minutes after CHG soap is applied, then you may rinse off the CHG soap.  Pat dry with a clean towel  Put on clean clothes/pajamas   If you choose to wear lotion, please use ONLY the CHG-compatible lotions that are listed below.  Additional instructions for the day of surgery:  If you choose, you may shower the morning of surgery with an antibacterial soap.  DO NOT APPLY any lotions, deodorants, cologne, or perfumes.   Do not bring valuables to the hospital. [facility] is not responsible for any belongings/valuables. Do not wear nail polish, gel polish, artificial nails, or any other type of covering on natural nails (fingers and toes) Do not wear jewelry or makeup Put on clean/comfortable clothes.  Please brush your teeth.  Ask your nurse before applying any prescription medications to the skin.     CHG Compatible Lotions   Aveeno Moisturizing lotion  Cetaphil Moisturizing Cream  Cetaphil Moisturizing Lotion  Clairol Herbal Essence Moisturizing Lotion, Dry Skin  Clairol Herbal Essence Moisturizing Lotion, Extra Dry Skin  Clairol Herbal Essence  Moisturizing Lotion, Normal Skin  Curel Age Defying Therapeutic Moisturizing Lotion with Alpha Hydroxy  Curel Extreme Care Body Lotion  Curel Soothing Hands Moisturizing Hand Lotion  Curel Therapeutic Moisturizing Cream, Fragrance-Free  Curel Therapeutic Moisturizing Lotion, Fragrance-Free  Curel Therapeutic Moisturizing Lotion, Original Formula  Eucerin Daily Replenishing Lotion  Eucerin Dry Skin Therapy Plus Alpha Hydroxy Crme  Eucerin Dry Skin Therapy Plus Alpha Hydroxy Lotion  Eucerin Original Crme  Eucerin Original Lotion  Eucerin Plus Crme Eucerin Plus Lotion  Eucerin TriLipid Replenishing Lotion  Keri Anti-Bacterial Hand Lotion  Keri Deep Conditioning Original Lotion Dry Skin Formula Softly Scented  Keri Deep Conditioning Original Lotion, Fragrance Free Sensitive Skin Formula  Keri Lotion Fast Absorbing Fragrance Free Sensitive Skin Formula  Keri Lotion Fast Absorbing Softly Scented Dry Skin Formula  Keri Original Lotion  Keri Skin Renewal Lotion Keri Silky Smooth Lotion  Keri Silky Smooth Sensitive Skin Lotion  Nivea Body Creamy Conditioning Oil  Nivea Body Extra Enriched Lotion  Nivea Body Original Lotion  Nivea Body Sheer Moisturizing Lotion Nivea Crme  Nivea Skin Firming Lotion  NutraDerm 30 Skin Lotion  NutraDerm Skin Lotion  NutraDerm Therapeutic Skin Cream  NutraDerm Therapeutic Skin Lotion  ProShield Protective Hand Cream  Provon moisturizing lotion  Please read over the following fact sheets that you were given.

## 2024-03-04 NOTE — Progress Notes (Signed)
 PCP - [facility] Cardiologist - [facility] Neurology - Dr Modena Callander  Chest x-ray - 12/21/23 EKG - 12/24/23 Stress Test - 09/24/12 ECHO - 06/02/21 Cardiac Cath - 06/03/21  ICD Pacemaker/Loop - n/a  Sleep Study -  Yes CPAP - does not use CPAP  Diabetes Type 2 Last dose of Jardiance  will be on Thursday, 03/06/24.  Hold Jardiance  for 72 hours prior to procedure.     Do not take Metformin  on the morning of surgery.  Aspirin  & Blood Thinner Instructions:  n/a.  ERAS - clear liquids til 11 AM DOS.  Anesthesia review: Yes  STOP now taking any Aspirin  (unless otherwise instructed by your surgeon), Aleve , Naproxen , Ibuprofen , Motrin , Advil , Goody's, BC's, all herbal medications, fish oil, and all vitamins.   Coronavirus Screening Do you have any of the following symptoms:  Cough yes/no: No Fever (>100.37F)  yes/no: No Runny nose occasional Sore throat yes/no: No Difficulty breathing/shortness of breath  yes/no: No  Have you traveled in the last 14 days and where? yes/no: No  Patient verbalized understanding of instructions that were given to them at the PAT appointment. Patient was also instructed that they will need to review over the PAT instructions again at home before surgery.

## 2024-03-05 ENCOUNTER — Other Ambulatory Visit: Payer: Self-pay

## 2024-03-05 ENCOUNTER — Encounter (HOSPITAL_COMMUNITY)
Admission: RE | Admit: 2024-03-05 | Discharge: 2024-03-05 | Disposition: A | Discharge status: Home / Self Care | Source: Ambulatory Visit | Attending: Neurosurgery | Admitting: Neurosurgery

## 2024-03-05 ENCOUNTER — Encounter (HOSPITAL_COMMUNITY): Payer: Self-pay

## 2024-03-05 VITALS — BP 117/81 | HR 91 | Temp 97.8°F | Resp 18 | Ht 72.0 in | Wt 232.0 lb

## 2024-03-05 DIAGNOSIS — E039 Hypothyroidism, unspecified: Secondary | ICD-10-CM | POA: Diagnosis not present

## 2024-03-05 DIAGNOSIS — Z01812 Encounter for preprocedural laboratory examination: Secondary | ICD-10-CM | POA: Diagnosis not present

## 2024-03-05 DIAGNOSIS — G4733 Obstructive sleep apnea (adult) (pediatric): Secondary | ICD-10-CM | POA: Diagnosis not present

## 2024-03-05 DIAGNOSIS — Z79899 Other long term (current) drug therapy: Secondary | ICD-10-CM | POA: Diagnosis not present

## 2024-03-05 DIAGNOSIS — Z87891 Personal history of nicotine dependence: Secondary | ICD-10-CM | POA: Insufficient documentation

## 2024-03-05 DIAGNOSIS — I2510 Atherosclerotic heart disease of native coronary artery without angina pectoris: Secondary | ICD-10-CM | POA: Insufficient documentation

## 2024-03-05 DIAGNOSIS — K219 Gastro-esophageal reflux disease without esophagitis: Secondary | ICD-10-CM | POA: Insufficient documentation

## 2024-03-05 DIAGNOSIS — E119 Type 2 diabetes mellitus without complications: Secondary | ICD-10-CM | POA: Insufficient documentation

## 2024-03-05 DIAGNOSIS — E785 Hyperlipidemia, unspecified: Secondary | ICD-10-CM | POA: Diagnosis not present

## 2024-03-05 DIAGNOSIS — L508 Other urticaria: Secondary | ICD-10-CM | POA: Diagnosis not present

## 2024-03-05 DIAGNOSIS — M50021 Cervical disc disorder at C4-C5 level with myelopathy: Secondary | ICD-10-CM | POA: Diagnosis not present

## 2024-03-05 DIAGNOSIS — Z01818 Encounter for other preprocedural examination: Secondary | ICD-10-CM

## 2024-03-05 DIAGNOSIS — I10 Essential (primary) hypertension: Secondary | ICD-10-CM | POA: Insufficient documentation

## 2024-03-05 HISTORY — DX: Depression, unspecified: F32.A

## 2024-03-05 HISTORY — DX: Chronic kidney disease, unspecified: N18.9

## 2024-03-05 HISTORY — DX: Anxiety disorder, unspecified: F41.9

## 2024-03-05 HISTORY — DX: Type 2 diabetes mellitus without complications: E11.9

## 2024-03-05 LAB — COMPREHENSIVE METABOLIC PANEL WITH GFR
ALT: 37 U/L (ref 0–44)
AST: 27 U/L (ref 15–41)
Albumin: 3.9 g/dL (ref 3.5–5.0)
Alkaline Phosphatase: 77 U/L (ref 38–126)
Anion gap: 17 — ABNORMAL HIGH (ref 5–15)
BUN: 13 mg/dL (ref 6–20)
CO2: 22 mmol/L (ref 22–32)
Calcium: 8.8 mg/dL — ABNORMAL LOW (ref 8.9–10.3)
Chloride: 94 mmol/L — ABNORMAL LOW (ref 98–111)
Creatinine, Ser: 1 mg/dL (ref 0.61–1.24)
GFR, Estimated: >60 mL/min (ref >60)
Glucose, Bld: 262 mg/dL — ABNORMAL HIGH (ref 70–99)
Potassium: 3.2 mmol/L — ABNORMAL LOW (ref 3.5–5.1)
Sodium: 133 mmol/L — ABNORMAL LOW (ref 135–145)
Total Bilirubin: 0.6 mg/dL (ref 0.0–1.2)
Total Protein: 6.8 g/dL (ref 6.5–8.1)

## 2024-03-05 LAB — CBC
HCT: 49.7 % (ref 39.0–52.0)
Hemoglobin: 17.2 g/dL — ABNORMAL HIGH (ref 13.0–17.0)
MCH: 32 pg (ref 26.0–34.0)
MCHC: 34.6 g/dL (ref 30.0–36.0)
MCV: 92.6 fL (ref 80.0–100.0)
Platelets: 225 K/uL (ref 150–400)
RBC: 5.37 MIL/uL (ref 4.22–5.81)
RDW: 12.2 % (ref 11.5–15.5)
WBC: 7.5 K/uL (ref 4.0–10.5)
nRBC: 0 % (ref 0.0–0.2)

## 2024-03-05 LAB — SURGICAL PCR SCREEN
MRSA, PCR: NEGATIVE
Staphylococcus aureus: NEGATIVE

## 2024-03-05 LAB — TYPE AND SCREEN
ABO/RH(D): A POS
Antibody Screen: NEGATIVE

## 2024-03-05 LAB — HEMOGLOBIN A1C
Hgb A1c MFr Bld: 6.2 % — ABNORMAL HIGH (ref 4.8–5.6)
Mean Plasma Glucose: 131.24 mg/dL

## 2024-03-05 LAB — GLUCOSE, CAPILLARY: Glucose-Capillary: 289 mg/dL — ABNORMAL HIGH (ref 70–99)

## 2024-03-06 NOTE — Progress Notes (Signed)
 Anesthesia Chart Review:  58 year old male with pertinent history including former smoker (quit 2020), non-insulin -dependent DM2, chronic urticaria on Xolair , hypothyroidism, GERD on PPI, OSA not on CPAP, HTN, HLD, CAD, s/p C4-6 ACDF.  Follows cardiology at the [facility] for history of CAD (cath 05/2021 with 95% RV manage medically, no other significant disease), HTN, HLD.  Last seen in follow-up 09/11/23 and noted to be doing well from cardiac standpoint, very active without symptoms. Per note, Small vessel coronary artery disease involving much less than 5% of the total left ventricular myocardium. Vessels too small to warrant revascularization. Patient is angina and dyspnea free. Has normal ejection fraction 55 to 60%. He has no heart failure or arrhythmia. No changes to management.  Patient recently evaluated by neurosurgery for complaints of bilateral shoulder pain, numbness, weakness in hands, difficulty walking.  Per Dr. Eliott note 02/27/2024, History clinical exam consistent with myelopathy imaging showing cord compression at C3-4 with signal change in his cord certainly this correlates with his cervical myelopathy presentation.  Preop labs reviewed, mild hyponatremia sodium 133 (normal corrected for glucose), mild hypokalemia potassium 3.2, hyperglycemia glucose 262.  A1c 6.2.  EKG 12/21/2023: Sinus rhythm.  Rate 89. 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   LV end diastolic pressure is normal.   The left ventricular ejection fraction is 55-65% by visual estimate.   1.  Distal RV marginal disease that is collateralized by the left system; medical therapy should be pursued as this is a relatively small vessel with otherwise mild obstructive disease elsewhere. 2.  LVEDP of 13 mmHg with preserved left ventricular function.  TTE 06/02/2021: 1. Left ventricular ejection fraction, by estimation, is 60 to 65%. The  left ventricle has normal function. The  left ventricle has no regional  wall motion abnormalities. There is mild left ventricular hypertrophy.  Left ventricular diastolic parameters  are indeterminate.   2. Right ventricular systolic function is normal. The right ventricular  size is normal. There is normal pulmonary artery systolic pressure. The  estimated right ventricular systolic pressure is 20.0 mmHg.   3. The mitral valve is grossly normal. Trivial mitral valve  regurgitation.   4. The aortic valve is tricuspid. Aortic valve regurgitation is not  visualized. No aortic stenosis is present. Aortic valve mean gradient  measures 3.0 mmHg.   5. The inferior vena cava is normal in size with greater than 50%  respiratory variability, suggesting right atrial pressure of 3 mmHg.      Lynwood Geofm RIGGERS [facility] Short Stay Center/Anesthesiology Phone [phone] 03/06/2024 3:48 PM

## 2024-03-06 NOTE — Anesthesia Preprocedure Evaluation (Addendum)
 Anesthesia Evaluation  Patient identified by MRN, date of birth, ID band Patient awake    Reviewed: Allergy & Precautions, NPO status , Patient's Chart, lab work & pertinent test results  Airway Mallampati: II  TM Distance: >3 FB Neck ROM: Limited    Dental no notable dental hx.    Pulmonary sleep apnea and Continuous Positive Airway Pressure Ventilation , former smoker   Pulmonary exam normal        Cardiovascular hypertension, Pt. on medications + CAD  Normal cardiovascular exam     Neuro/Psych  PSYCHIATRIC DISORDERS Anxiety Depression    TIA   GI/Hepatic Neg liver ROS, PUD,,,  Endo/Other  diabetes, Oral Hypoglycemic AgentsHypothyroidism    Renal/GU      Musculoskeletal  (+) Arthritis ,    Abdominal  (+) + obese  Peds  Hematology negative hematology ROS (+)   Anesthesia Other Findings Cervical myelopathy  Reproductive/Obstetrics                              Anesthesia Physical Anesthesia Plan  ASA: 3  Anesthesia Plan: General   Post-op Pain Management:    Induction: Intravenous  PONV Risk Score and Plan: 2 and Ondansetron , Dexamethasone , Midazolam  and Treatment may vary due to age or medical condition  Airway Management Planned: Oral ETT and Video Laryngoscope Planned  Additional Equipment:   Intra-op Plan:   Post-operative Plan: Extubation in OR  Informed Consent: I have reviewed the patients History and Physical, chart, labs and discussed the procedure including the risks, benefits and alternatives for the proposed anesthesia with the patient or authorized representative who has indicated his/her understanding and acceptance.     Dental advisory given  Plan Discussed with: CRNA  Anesthesia Plan Comments: (PAT note by Lynwood Hope, PA-C:  58 year old male with pertinent history including former smoker (quit 2020), non-insulin -dependent DM2, chronic urticaria on  Xolair , hypothyroidism, GERD on PPI, OSA not on CPAP, HTN, HLD, CAD, s/p C4-6 ACDF.  Follows cardiology at the [facility] for history of CAD (cath 05/2021 with 95% RV manage medically, no other significant disease), HTN, HLD.  Last seen in follow-up 09/11/23 and noted to be doing well from cardiac standpoint, very active without symptoms. Per note, Small vessel coronary artery disease involving much less than 5% of the total left ventricular myocardium. Vessels too small to warrant revascularization. Patient is angina and dyspnea free. Has normal ejection fraction 55 to 60%. He has no heart failure or arrhythmia. No changes to management.  Patient recently evaluated by neurosurgery for complaints of bilateral shoulder pain, numbness, weakness in hands, difficulty walking.  Per Dr. Eliott note 02/27/2024, History clinical exam consistent with myelopathy imaging showing cord compression at C3-4 with signal change in his cord certainly this correlates with his cervical myelopathy presentation.  Preop labs reviewed, mild hyponatremia sodium 133 (normal corrected for glucose), mild hypokalemia potassium 3.2, hyperglycemia glucose 262.  A1c 6.2.  EKG 12/21/2023: Sinus rhythm.  Rate 89. 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   LV end diastolic pressure is normal.   The left ventricular ejection fraction is 55-65% by visual estimate.  1.  Distal RV marginal disease that is collateralized by the left system; medical therapy should be pursued as this is a relatively small vessel with otherwise mild obstructive disease elsewhere. 2.  LVEDP of 13 mmHg with preserved left ventricular function.  TTE 06/02/2021: 1. Left ventricular  ejection fraction, by estimation, is 60 to 65%. The  left ventricle has normal function. The left ventricle has no regional  wall motion abnormalities. There is mild left ventricular hypertrophy.  Left ventricular diastolic parameters  are  indeterminate.  2. Right ventricular systolic function is normal. The right ventricular  size is normal. There is normal pulmonary artery systolic pressure. The  estimated right ventricular systolic pressure is 20.0 mmHg.  3. The mitral valve is grossly normal. Trivial mitral valve  regurgitation.  4. The aortic valve is tricuspid. Aortic valve regurgitation is not  visualized. No aortic stenosis is present. Aortic valve mean gradient  measures 3.0 mmHg.  5. The inferior vena cava is normal in size with greater than 50%  respiratory variability, suggesting right atrial pressure of 3 mmHg.   )         Anesthesia Quick Evaluation

## 2024-03-10 ENCOUNTER — Inpatient Hospital Stay (HOSPITAL_COMMUNITY): Admitting: Anesthesiology

## 2024-03-10 ENCOUNTER — Other Ambulatory Visit: Payer: Self-pay

## 2024-03-10 ENCOUNTER — Inpatient Hospital Stay (HOSPITAL_COMMUNITY): Payer: Self-pay | Admitting: Physician Assistant

## 2024-03-10 ENCOUNTER — Encounter (HOSPITAL_COMMUNITY): Payer: Self-pay | Admitting: Neurosurgery

## 2024-03-10 ENCOUNTER — Inpatient Hospital Stay (HOSPITAL_COMMUNITY)

## 2024-03-10 ENCOUNTER — Inpatient Hospital Stay (HOSPITAL_COMMUNITY)
Admission: RE | Admit: 2024-03-10 | Discharge: 2024-03-11 | DRG: 472 | Disposition: A | Discharge status: Home / Self Care | Attending: Neurosurgery | Admitting: Neurosurgery

## 2024-03-10 ENCOUNTER — Inpatient Hospital Stay (HOSPITAL_COMMUNITY): Admission: RE | Disposition: A | Payer: Self-pay | Source: Home / Self Care | Attending: Neurosurgery

## 2024-03-10 DIAGNOSIS — E785 Hyperlipidemia, unspecified: Secondary | ICD-10-CM | POA: Diagnosis present

## 2024-03-10 DIAGNOSIS — Z96643 Presence of artificial hip joint, bilateral: Secondary | ICD-10-CM | POA: Diagnosis present

## 2024-03-10 DIAGNOSIS — M4802 Spinal stenosis, cervical region: Secondary | ICD-10-CM | POA: Diagnosis present

## 2024-03-10 DIAGNOSIS — Z888 Allergy status to other drugs, medicaments and biological substances status: Secondary | ICD-10-CM | POA: Diagnosis not present

## 2024-03-10 DIAGNOSIS — Z79899 Other long term (current) drug therapy: Secondary | ICD-10-CM | POA: Diagnosis not present

## 2024-03-10 DIAGNOSIS — M4712 Other spondylosis with myelopathy, cervical region: Secondary | ICD-10-CM | POA: Diagnosis present

## 2024-03-10 DIAGNOSIS — Z981 Arthrodesis status: Secondary | ICD-10-CM | POA: Diagnosis not present

## 2024-03-10 DIAGNOSIS — Z9049 Acquired absence of other specified parts of digestive tract: Secondary | ICD-10-CM | POA: Diagnosis not present

## 2024-03-10 DIAGNOSIS — Z860101 Personal history of adenomatous and serrated colon polyps: Secondary | ICD-10-CM

## 2024-03-10 DIAGNOSIS — Z79890 Hormone replacement therapy (postmenopausal): Secondary | ICD-10-CM

## 2024-03-10 DIAGNOSIS — Z808 Family history of malignant neoplasm of other organs or systems: Secondary | ICD-10-CM | POA: Diagnosis not present

## 2024-03-10 DIAGNOSIS — I2510 Atherosclerotic heart disease of native coronary artery without angina pectoris: Secondary | ICD-10-CM | POA: Diagnosis present

## 2024-03-10 DIAGNOSIS — Z91048 Other nonmedicinal substance allergy status: Secondary | ICD-10-CM

## 2024-03-10 DIAGNOSIS — E119 Type 2 diabetes mellitus without complications: Secondary | ICD-10-CM | POA: Diagnosis present

## 2024-03-10 DIAGNOSIS — Z7984 Long term (current) use of oral hypoglycemic drugs: Secondary | ICD-10-CM

## 2024-03-10 DIAGNOSIS — G959 Disease of spinal cord, unspecified: Secondary | ICD-10-CM | POA: Diagnosis present

## 2024-03-10 DIAGNOSIS — E890 Postprocedural hypothyroidism: Secondary | ICD-10-CM | POA: Diagnosis present

## 2024-03-10 DIAGNOSIS — Z87891 Personal history of nicotine dependence: Secondary | ICD-10-CM

## 2024-03-10 DIAGNOSIS — Z803 Family history of malignant neoplasm of breast: Secondary | ICD-10-CM | POA: Diagnosis not present

## 2024-03-10 DIAGNOSIS — Z8249 Family history of ischemic heart disease and other diseases of the circulatory system: Secondary | ICD-10-CM

## 2024-03-10 DIAGNOSIS — Z91041 Radiographic dye allergy status: Secondary | ICD-10-CM

## 2024-03-10 DIAGNOSIS — F32A Depression, unspecified: Secondary | ICD-10-CM | POA: Diagnosis present

## 2024-03-10 DIAGNOSIS — I10 Essential (primary) hypertension: Secondary | ICD-10-CM | POA: Diagnosis present

## 2024-03-10 DIAGNOSIS — Z91013 Allergy to seafood: Secondary | ICD-10-CM | POA: Diagnosis not present

## 2024-03-10 DIAGNOSIS — Z91018 Allergy to other foods: Secondary | ICD-10-CM | POA: Diagnosis not present

## 2024-03-10 HISTORY — PX: ANTERIOR CERVICAL DECOMP/DISCECTOMY FUSION: SHX1161

## 2024-03-10 LAB — GLUCOSE, CAPILLARY
Glucose-Capillary: 160 mg/dL — ABNORMAL HIGH (ref 70–99)
Glucose-Capillary: 117 mg/dL — ABNORMAL HIGH (ref 70–99)
Glucose-Capillary: 134 mg/dL — ABNORMAL HIGH (ref 70–99)
Glucose-Capillary: 359 mg/dL — ABNORMAL HIGH (ref 70–99)

## 2024-03-10 SURGERY — ANTERIOR CERVICAL DECOMPRESSION/DISCECTOMY FUSION 1 LEVEL/HARDWARE REMOVAL
Anesthesia: General | Site: Spine Cervical

## 2024-03-10 MED ORDER — HYDROMORPHONE HCL 1 MG/ML IJ SOLN
INTRAMUSCULAR | Status: AC
  Filled 2024-03-10: qty 1

## 2024-03-10 MED ORDER — ONDANSETRON HCL 4 MG/2ML IJ SOLN: 4.0000 mg | Freq: Four times a day (QID) | INTRAMUSCULAR | Status: DC | PRN

## 2024-03-10 MED ORDER — OXYCODONE HCL 5 MG PO TABS
5.0000 mg | ORAL_TABLET | Freq: Once | ORAL | Status: AC | PRN
  Administered 2024-03-10: 5 mg via ORAL

## 2024-03-10 MED ORDER — EMPAGLIFLOZIN 25 MG PO TABS
25.0000 mg | ORAL_TABLET | Freq: Every day | ORAL | Status: DC
  Administered 2024-03-11: 25 mg via ORAL
  Filled 2024-03-10: qty 1

## 2024-03-10 MED ORDER — ONDANSETRON HCL 4 MG/2ML IJ SOLN
INTRAMUSCULAR | Status: DC | PRN
  Administered 2024-03-10: 4 mg via INTRAVENOUS

## 2024-03-10 MED ORDER — INSULIN ASPART 100 UNIT/ML IJ SOLN
0.0000 [IU] | Freq: Three times a day (TID) | INTRAMUSCULAR | Status: DC
  Administered 2024-03-11: 8 [IU] via SUBCUTANEOUS

## 2024-03-10 MED ORDER — FENTANYL CITRATE (PF) 100 MCG/2ML IJ SOLN
INTRAMUSCULAR | Status: AC
  Filled 2024-03-10: qty 2

## 2024-03-10 MED ORDER — HYDROMORPHONE HCL 1 MG/ML IJ SOLN
INTRAMUSCULAR | Status: AC
  Filled 2024-03-10: qty 0.5

## 2024-03-10 MED ORDER — ONDANSETRON HCL 4 MG PO TABS: 4.0000 mg | ORAL_TABLET | Freq: Four times a day (QID) | ORAL | Status: DC | PRN

## 2024-03-10 MED ORDER — MENTHOL (TOPICAL ANALGESIC) 7.5 % (ROLL) EX MISC: 1.0000 | CUTANEOUS | Status: DC | PRN

## 2024-03-10 MED ORDER — MENTHOL 3 MG MT LOZG: 1.0000 | LOZENGE | OROMUCOSAL | Status: DC | PRN

## 2024-03-10 MED ORDER — MECLIZINE HCL 12.5 MG PO TABS: 12.5000 mg | ORAL_TABLET | Freq: Two times a day (BID) | ORAL | Status: DC | PRN

## 2024-03-10 MED ORDER — DIPHENHYDRAMINE HCL 25 MG PO CAPS: 25.0000 mg | ORAL_CAPSULE | Freq: Two times a day (BID) | ORAL | Status: DC | PRN

## 2024-03-10 MED ORDER — GABAPENTIN 300 MG PO CAPS
300.0000 mg | ORAL_CAPSULE | Freq: Two times a day (BID) | ORAL | Status: DC
  Administered 2024-03-10: 300 mg via ORAL
  Filled 2024-03-10: qty 1

## 2024-03-10 MED ORDER — THROMBIN 5000 UNITS EX KIT
PACK | CUTANEOUS | Status: AC
  Filled 2024-03-10: qty 2

## 2024-03-10 MED ORDER — HYDROMORPHONE HCL 1 MG/ML IJ SOLN
INTRAMUSCULAR | Status: DC | PRN
Start: 2024-03-10 — End: 2024-03-10
  Administered 2024-03-10 (×2): .5 mg via INTRAVENOUS

## 2024-03-10 MED ORDER — ACETAMINOPHEN 325 MG PO TABS: 650.0000 mg | ORAL_TABLET | ORAL | Status: DC | PRN

## 2024-03-10 MED ORDER — ALUM & MAG HYDROXIDE-SIMETH 200-200-20 MG/5ML PO SUSP: 30.0000 mL | Freq: Four times a day (QID) | ORAL | Status: DC | PRN

## 2024-03-10 MED ORDER — LIDOCAINE 2% (20 MG/ML) 5 ML SYRINGE
INTRAMUSCULAR | Status: DC | PRN
  Administered 2024-03-10: 60 mg via INTRAVENOUS

## 2024-03-10 MED ORDER — HYDROMORPHONE HCL 1 MG/ML IJ SOLN
0.5000 mg | INTRAMUSCULAR | Status: DC | PRN
  Administered 2024-03-10 – 2024-03-11 (×2): 0.5 mg via INTRAVENOUS
  Filled 2024-03-10 (×2): qty 0.5

## 2024-03-10 MED ORDER — LEVOTHYROXINE SODIUM 100 MCG PO TABS
300.0000 ug | ORAL_TABLET | Freq: Every day | ORAL | Status: DC
  Administered 2024-03-11: 300 ug via ORAL
  Filled 2024-03-10: qty 3

## 2024-03-10 MED ORDER — AMISULPRIDE (ANTIEMETIC) 5 MG/2ML IV SOLN: 10.0000 mg | Freq: Once | INTRAVENOUS | Status: DC | PRN

## 2024-03-10 MED ORDER — HYDROMORPHONE HCL 1 MG/ML IJ SOLN
0.2500 mg | INTRAMUSCULAR | Status: DC | PRN
  Administered 2024-03-10 (×2): 0.5 mg via INTRAVENOUS

## 2024-03-10 MED ORDER — 0.9 % SODIUM CHLORIDE (POUR BTL) OPTIME
TOPICAL | Status: DC | PRN
  Administered 2024-03-10: 1000 mL

## 2024-03-10 MED ORDER — EPINEPHRINE 0.3 MG/0.3ML IJ SOAJ: 0.3000 mg | INTRAMUSCULAR | Status: DC | PRN

## 2024-03-10 MED ORDER — ACETAMINOPHEN 650 MG RE SUPP: 650.0000 mg | RECTAL | Status: DC | PRN

## 2024-03-10 MED ORDER — TRAMADOL HCL 50 MG PO TABS: 50.0000 mg | ORAL_TABLET | Freq: Four times a day (QID) | ORAL | Status: DC | PRN

## 2024-03-10 MED ORDER — DIPHENHYDRAMINE HCL 25 MG PO TABS: 25.0000 mg | ORAL_TABLET | Freq: Two times a day (BID) | ORAL | Status: DC | PRN

## 2024-03-10 MED ORDER — METFORMIN HCL ER 750 MG PO TB24
750.0000 mg | ORAL_TABLET | Freq: Every day | ORAL | Status: DC
  Administered 2024-03-11: 750 mg via ORAL
  Filled 2024-03-10: qty 1

## 2024-03-10 MED ORDER — CEFAZOLIN SODIUM-DEXTROSE 2-4 GM/100ML-% IV SOLN
2.0000 g | Freq: Once | INTRAVENOUS | Status: AC
Start: 2024-03-10 — End: 2024-03-10
  Administered 2024-03-10: 2 g via INTRAVENOUS

## 2024-03-10 MED ORDER — STERILE WATER FOR IRRIGATION IR SOLN
Status: DC | PRN
  Administered 2024-03-10: 1000 mL

## 2024-03-10 MED ORDER — PANTOPRAZOLE SODIUM 40 MG IV SOLR
40.0000 mg | Freq: Every day | INTRAVENOUS | Status: DC
Start: 2024-03-10 — End: 2024-03-10

## 2024-03-10 MED ORDER — DOCUSATE SODIUM 100 MG PO CAPS
100.0000 mg | ORAL_CAPSULE | Freq: Two times a day (BID) | ORAL | Status: DC | PRN
  Administered 2024-03-10: 100 mg via ORAL
  Filled 2024-03-10: qty 1

## 2024-03-10 MED ORDER — PHENOL 1.4 % MT LIQD
1.0000 | OROMUCOSAL | Status: DC | PRN
  Administered 2024-03-11: 1 via OROMUCOSAL
  Filled 2024-03-10: qty 177

## 2024-03-10 MED ORDER — LACTATED RINGERS IV SOLN: INTRAVENOUS | Status: DC | PRN

## 2024-03-10 MED ORDER — MECLIZINE HCL 25 MG PO CHEW: 12.5000 mg | CHEWABLE_TABLET | Freq: Two times a day (BID) | ORAL | Status: DC | PRN

## 2024-03-10 MED ORDER — SENNA 8.6 MG PO TABS
1.0000 | ORAL_TABLET | Freq: Two times a day (BID) | ORAL | Status: DC | PRN
  Administered 2024-03-10: 8.6 mg via ORAL
  Filled 2024-03-10: qty 1

## 2024-03-10 MED ORDER — PANTOPRAZOLE SODIUM 40 MG PO TBEC: 40.0000 mg | DELAYED_RELEASE_TABLET | Freq: Every day | ORAL | Status: DC

## 2024-03-10 MED ORDER — INSULIN ASPART 100 UNIT/ML IJ SOLN
0.0000 [IU] | Freq: Every day | INTRAMUSCULAR | Status: DC
  Administered 2024-03-10: 5 [IU] via SUBCUTANEOUS

## 2024-03-10 MED ORDER — OXYCODONE HCL 5 MG/5ML PO SOLN: 5.0000 mg | Freq: Once | ORAL | Status: AC | PRN

## 2024-03-10 MED ORDER — ORAL CARE MOUTH RINSE: 15.0000 mL | Freq: Once | OROMUCOSAL | Status: AC

## 2024-03-10 MED ORDER — CHLORTHALIDONE 25 MG PO TABS: 25.0000 mg | ORAL_TABLET | Freq: Every day | ORAL | Status: DC

## 2024-03-10 MED ORDER — HYDROCODONE-ACETAMINOPHEN 5-325 MG PO TABS
2.0000 | ORAL_TABLET | ORAL | Status: DC | PRN
  Administered 2024-03-10 – 2024-03-11 (×3): 2 via ORAL
  Filled 2024-03-10 (×3): qty 2

## 2024-03-10 MED ORDER — INSULIN ASPART 100 UNIT/ML IJ SOLN
0.0000 [IU] | INTRAMUSCULAR | Status: DC | PRN
  Administered 2024-03-10: 2 [IU] via SUBCUTANEOUS
  Filled 2024-03-10: qty 1

## 2024-03-10 MED ORDER — SUGAMMADEX SODIUM 200 MG/2ML IV SOLN
INTRAVENOUS | Status: DC | PRN
  Administered 2024-03-10: 200 mg via INTRAVENOUS

## 2024-03-10 MED ORDER — ACETAMINOPHEN 10 MG/ML IV SOLN
INTRAVENOUS | Status: AC
  Filled 2024-03-10: qty 100

## 2024-03-10 MED ORDER — ROCURONIUM BROMIDE 10 MG/ML (PF) SYRINGE
PREFILLED_SYRINGE | INTRAVENOUS | Status: DC | PRN
  Administered 2024-03-10: 60 mg via INTRAVENOUS
  Administered 2024-03-10: 10 mg via INTRAVENOUS
  Administered 2024-03-10: 20 mg via INTRAVENOUS

## 2024-03-10 MED ORDER — ONDANSETRON 4 MG PO TBDP: 4.0000 mg | ORAL_TABLET | Freq: Three times a day (TID) | ORAL | Status: DC | PRN

## 2024-03-10 MED ORDER — SODIUM CHLORIDE 0.9 % IV SOLN: 250.0000 mL | INTRAVENOUS | Status: DC

## 2024-03-10 MED ORDER — SODIUM CHLORIDE 0.9% FLUSH: 3.0000 mL | INTRAVENOUS | Status: DC | PRN

## 2024-03-10 MED ORDER — LEVOTHYROXINE SODIUM 25 MCG PO TABS: 50.0000 ug | ORAL_TABLET | Freq: Every day | ORAL | Status: DC

## 2024-03-10 MED ORDER — DEXAMETHASONE SOD PHOSPHATE PF 10 MG/ML IJ SOLN
INTRAMUSCULAR | Status: DC | PRN
  Administered 2024-03-10: 10 mg via INTRAVENOUS

## 2024-03-10 MED ORDER — INSULIN ASPART 100 UNIT/ML IJ SOLN: 0.0000 [IU] | Freq: Three times a day (TID) | INTRAMUSCULAR | Status: DC

## 2024-03-10 MED ORDER — FENTANYL CITRATE (PF) 100 MCG/2ML IJ SOLN
25.0000 ug | INTRAMUSCULAR | Status: DC | PRN
  Administered 2024-03-10 (×2): 50 ug via INTRAVENOUS

## 2024-03-10 MED ORDER — FENTANYL CITRATE (PF) 250 MCG/5ML IJ SOLN
INTRAMUSCULAR | Status: DC | PRN
  Administered 2024-03-10: 25 ug via INTRAVENOUS
  Administered 2024-03-10: 50 ug via INTRAVENOUS
  Administered 2024-03-10: 25 ug via INTRAVENOUS
  Administered 2024-03-10: 100 ug via INTRAVENOUS

## 2024-03-10 MED ORDER — ACETAMINOPHEN 10 MG/ML IV SOLN
1000.0000 mg | Freq: Once | INTRAVENOUS | Status: DC | PRN
  Administered 2024-03-10: 1000 mg via INTRAVENOUS

## 2024-03-10 MED ORDER — SODIUM CHLORIDE 0.9% FLUSH
3.0000 mL | Freq: Two times a day (BID) | INTRAVENOUS | Status: DC
  Administered 2024-03-10: 3 mL via INTRAVENOUS

## 2024-03-10 MED ORDER — LOSARTAN POTASSIUM 50 MG PO TABS
100.0000 mg | ORAL_TABLET | Freq: Every day | ORAL | Status: DC
  Administered 2024-03-10: 100 mg via ORAL
  Filled 2024-03-10: qty 2

## 2024-03-10 MED ORDER — CHLORHEXIDINE GLUCONATE 0.12 % MT SOLN
15.0000 mL | Freq: Once | OROMUCOSAL | Status: AC
  Administered 2024-03-10: 15 mL via OROMUCOSAL
  Filled 2024-03-10: qty 15

## 2024-03-10 MED ORDER — TAMSULOSIN HCL 0.4 MG PO CAPS
0.4000 mg | ORAL_CAPSULE | Freq: Every morning | ORAL | Status: DC
  Administered 2024-03-11: 0.4 mg via ORAL
  Filled 2024-03-10: qty 1

## 2024-03-10 MED ORDER — PANTOPRAZOLE SODIUM 40 MG PO TBEC
40.0000 mg | DELAYED_RELEASE_TABLET | Freq: Every day | ORAL | Status: DC
  Administered 2024-03-10: 40 mg via ORAL
  Filled 2024-03-10: qty 1

## 2024-03-10 MED ORDER — MUSCLE RUB 10-15 % EX CREA: TOPICAL_CREAM | CUTANEOUS | Status: DC | PRN

## 2024-03-10 MED ORDER — THROMBIN (RECOMBINANT) 5000 UNITS EX SOLR
CUTANEOUS | Status: DC | PRN
  Administered 2024-03-10: 10 mL via TOPICAL

## 2024-03-10 MED ORDER — PROPOFOL 10 MG/ML IV BOLUS
INTRAVENOUS | Status: DC | PRN
  Administered 2024-03-10: 200 mg via INTRAVENOUS

## 2024-03-10 MED ORDER — CEFAZOLIN SODIUM-DEXTROSE 2-4 GM/100ML-% IV SOLN
INTRAVENOUS | Status: AC
  Filled 2024-03-10: qty 100

## 2024-03-10 MED ORDER — OXYCODONE HCL 5 MG PO TABS
ORAL_TABLET | ORAL | Status: AC
  Filled 2024-03-10: qty 1

## 2024-03-10 MED ORDER — THROMBIN 5000 UNITS EX SOLR
CUTANEOUS | Status: DC | PRN
  Administered 2024-03-10: 5 mL via TOPICAL

## 2024-03-10 MED ORDER — TIZANIDINE HCL 4 MG PO TABS
4.0000 mg | ORAL_TABLET | Freq: Every day | ORAL | Status: DC
  Administered 2024-03-10: 4 mg via ORAL
  Filled 2024-03-10: qty 1

## 2024-03-10 MED ORDER — LACTATED RINGERS IV SOLN: INTRAVENOUS | Status: DC

## 2024-03-10 MED ORDER — CEFAZOLIN SODIUM-DEXTROSE 2-4 GM/100ML-% IV SOLN
2.0000 g | Freq: Three times a day (TID) | INTRAVENOUS | Status: AC
  Administered 2024-03-10 – 2024-03-11 (×2): 2 g via INTRAVENOUS
  Filled 2024-03-10 (×2): qty 100

## 2024-03-10 MED ORDER — NALOXONE HCL 4 MG/0.1ML NA LIQD: 1.0000 | NASAL | Status: DC | PRN

## 2024-03-10 MED ORDER — THROMBIN 5000 UNITS EX KIT
PACK | CUTANEOUS | Status: AC
Start: 2024-03-10 — End: 2024-03-10
  Filled 2024-03-10: qty 1

## 2024-03-10 MED ORDER — CYCLOBENZAPRINE HCL 10 MG PO TABS
10.0000 mg | ORAL_TABLET | Freq: Three times a day (TID) | ORAL | Status: DC | PRN
  Filled 2024-03-10: qty 1

## 2024-03-10 MED ORDER — MIRTAZAPINE 7.5 MG PO TABS
7.5000 mg | ORAL_TABLET | Freq: Every day | ORAL | Status: DC
  Administered 2024-03-10: 7.5 mg via ORAL
  Filled 2024-03-10: qty 1

## 2024-03-10 SURGICAL SUPPLY — 50 items
BAG COUNTER SPONGE SURGICOUNT (BAG) ×2 IMPLANT
BAND RUBBER #18 3X1/16 STRL (MISCELLANEOUS) ×4 IMPLANT
BASKET BONE COLLECTION (BASKET) ×2 IMPLANT
BENZOIN TINCTURE PRP APPL 2/3 (GAUZE/BANDAGES/DRESSINGS) ×2 IMPLANT
BIT DRILL NEURO 2X3.1 SFT TUCH (MISCELLANEOUS) ×2 IMPLANT
BUR MATCHSTICK NEURO 3.0 LAGG (BURR) ×2 IMPLANT
CANISTER SUCTION 3000ML PPV (SUCTIONS) ×2 IMPLANT
DERMABOND ADVANCED .7 DNX12 (GAUZE/BANDAGES/DRESSINGS) IMPLANT
DRAPE C-ARM 42X72 X-RAY (DRAPES) ×4 IMPLANT
DRAPE LAPAROTOMY 100X72 PEDS (DRAPES) ×2 IMPLANT
DRAPE MICROSCOPE SLANT 54X150 (MISCELLANEOUS) ×2 IMPLANT
DRSG OPSITE POSTOP 4X6 (GAUZE/BANDAGES/DRESSINGS) IMPLANT
DURAPREP 6ML APPLICATOR 50/CS (WOUND CARE) ×2 IMPLANT
ELECT COATED BLADE 2.86 ST (ELECTRODE) ×2 IMPLANT
ELECTRODE REM PT RTRN 9FT ADLT (ELECTROSURGICAL) ×2 IMPLANT
GAUZE 4X4 16PLY ~~LOC~~+RFID DBL (SPONGE) IMPLANT
GAUZE SPONGE 4X4 12PLY STRL (GAUZE/BANDAGES/DRESSINGS) ×2 IMPLANT
GLOVE BIO SURGEON STRL SZ7 (GLOVE) ×2 IMPLANT
GLOVE BIO SURGEON STRL SZ8 (GLOVE) ×2 IMPLANT
GLOVE BIOGEL PI IND STRL 7.0 (GLOVE) ×2 IMPLANT
GLOVE INDICATOR 8.5 STRL (GLOVE) ×2 IMPLANT
GOWN STRL REUS W/ TWL LRG LVL3 (GOWN DISPOSABLE) ×2 IMPLANT
GOWN STRL REUS W/ TWL XL LVL3 (GOWN DISPOSABLE) ×2 IMPLANT
GOWN STRL REUS W/TWL 2XL LVL3 (GOWN DISPOSABLE) IMPLANT
GRAFT BNE MATRIX VG FRMBL SM 1 (Bone Implant) IMPLANT
HALTER HD/CHIN CERV TRACTION D (MISCELLANEOUS) ×2 IMPLANT
HEMOSTAT POWDER KIT SURGIFOAM (HEMOSTASIS) ×2 IMPLANT
KIT BASIN OR (CUSTOM PROCEDURE TRAY) ×2 IMPLANT
KIT TURNOVER KIT B (KITS) ×2 IMPLANT
NDL HYPO 18GX1.5 BLUNT FILL (NEEDLE) ×2 IMPLANT
NDL SPNL 20GX3.5 QUINCKE YW (NEEDLE) ×2 IMPLANT
NEEDLE HYPO 18GX1.5 BLUNT FILL (NEEDLE) ×1 IMPLANT
NEEDLE SPNL 20GX3.5 QUINCKE YW (NEEDLE) ×1 IMPLANT
PACK LAMINECTOMY NEURO (CUSTOM PROCEDURE TRAY) ×2 IMPLANT
PAD ARMBOARD POSITIONER FOAM (MISCELLANEOUS) ×6 IMPLANT
PIN DISTRACTION 14MM (PIN) IMPLANT
PLATE CERV RES ACP 14 1L (Plate) IMPLANT
SCREW VA SD 4.6X14 (Screw) IMPLANT
SCREW VA SD RESONATE 4.2X14 (Screw) IMPLANT
SOLN 0.9% NACL POUR BTL 1000ML (IV SOLUTION) ×2 IMPLANT
SOLN STERILE WATER BTL 1000 ML (IV SOLUTION) ×2 IMPLANT
SPACER HEDRON C 12X14X6 0D (Spacer) IMPLANT
SPONGE INTESTINAL PEANUT (DISPOSABLE) ×2 IMPLANT
SPONGE SURGIFOAM ABS GEL SZ50 (HEMOSTASIS) ×2 IMPLANT
STRIP CLOSURE SKIN 1/2X4 (GAUZE/BANDAGES/DRESSINGS) ×2 IMPLANT
SUT VIC AB 3-0 SH 8-18 (SUTURE) ×2 IMPLANT
SUT VIC AB 4-0 PS2 27 (SUTURE) ×2 IMPLANT
TAPE CLOTH 4X10 WHT NS (GAUZE/BANDAGES/DRESSINGS) ×2 IMPLANT
TOWEL GREEN STERILE (TOWEL DISPOSABLE) ×2 IMPLANT
TOWEL GREEN STERILE FF (TOWEL DISPOSABLE) ×2 IMPLANT

## 2024-03-10 NOTE — H&P (Signed)
 Andrew Love is an 58 y.o. male.   Chief Complaint: Neck pain bilateral hand numbness and tingling HPI: 58 year old gentleman previous undergone C4-C6 fusion.  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  Past Medical History:  Diagnosis Date   Anxiety    Arthritis    CAD (coronary artery disease)    Chronic back pain    Chronic kidney disease    rx dx   Chronic left hip pain    Colitis    Hx - Per medical history from dated 06/13/10.   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   Sigmoid colon ulcer    Rectal polyps   Sleep apnea with use of continuous positive airway pressure (CPAP)    does not use CPAP   Urticaria     Past Surgical History:  Procedure Laterality Date   ABLATION Right 10/2023   neck   APPENDECTOMY     8th grade   BACK SURGERY     BIOPSY  10/17/2021   Procedure: BIOPSY;  Surgeon: Cindie Carlin POUR, DO;  Location: [facility];  Service: Endoscopy;;   CARDIAC CATHETERIZATION  2009   CARDIAC CATHETERIZATION     Per medical history from dated 06/13/10.   CERVICAL SPINE SURGERY     C4, C5, C6 spinal fusion   CHOLECYSTECTOMY N/A 08/31/2015   Procedure: LAPAROSCOPIC CHOLECYSTECTOMY WITH INTRAOPERATIVE CHOLANGIOGRAM;  Surgeon: Morene Olives, MD;  Location: [facility];  Service:  General;  Laterality: N/A;   COLONOSCOPY  07/2008   Colitis,NSAID v. Ischemia,malrotation of the gut,Diverticulosis(L),hyperplastic   COLONOSCOPY N/A 12/13/2012   Dr. Harvey: Normal TI, mild sigmoid diverticulosis, hemorrhoids, 2 polyps (tubular adenoma). Random colon bx negative. Next TCS 12/2022 with Fentanyl /phenergan    COLONOSCOPY WITH PROPOFOL  N/A 10/17/2021   Procedure: COLONOSCOPY WITH PROPOFOL ;  Surgeon: Cindie Carlin POUR, DO;  Location: [facility];  Service: Endoscopy;  Laterality: N/A;  10:15am   ESOPHAGOGASTRODUODENOSCOPY N/A 04/16/2014   RMR: Erosive reflux esophagitis. Non critical Schzki's ring not manipulated. Small hiatal hernia. Abnormal gastirc mucosa of doubtful signigicance status post biopsy. I suspect trivial upper GI bleed. Recent abdominal pain presumably secondary to a protracted bout of diverticulitis. CT scan November 23 revealed improvemetn without complication. He finished his antibiotics 2 days age. Left sided ab   ESOPHAGOGASTRODUODENOSCOPY (EGD) WITH PROPOFOL  N/A 10/17/2021   Procedure: ESOPHAGOGASTRODUODENOSCOPY (EGD) WITH PROPOFOL ;  Surgeon: Cindie Carlin POUR, DO;  Location: [facility];  Service: Endoscopy;  Laterality: N/A;   JOINT REPLACEMENT     LAPAROSCOPIC SIGMOID COLECTOMY  2012   Dr. Elon Pacini: recurrent sigmoid diverticulitis   LAPAROSCOPIC SIGMOID COLECTOMY  2012   recurernt sigmoid diverticulitis, Dr. Pacini    LEFT HEART CATH AND CORONARY ANGIOGRAPHY N/A 06/03/2021   Procedure: LEFT  HEART CATH AND CORONARY ANGIOGRAPHY;  Surgeon: Thukkani, Arun K, MD;  Location: [facility] CV [facility];  Service: Cardiovascular;  Laterality: N/A;   POLYPECTOMY  10/17/2021   Procedure: POLYPECTOMY;  Surgeon: Cindie Carlin POUR, DO;  Location: [facility];  Service: Endoscopy;;   ROTATOR CUFF REPAIR     Left - per medical history form dated 06/13/10.   SHOULDER SURGERY     Left   THYROIDECTOMY     Per medical history form dated 06/13/10.   TOTAL HIP ARTHROPLASTY   07/04/2011   Procedure: TOTAL Right  HIP ARTHROPLASTY;  Surgeon: Fonda SHAUNNA Olmsted, MD;  Location: [facility];  Service: Orthopedics;  Laterality: Right;   TOTAL HIP ARTHROPLASTY Left 07/22/2019   Procedure: TOTAL HIP ARTHROPLASTY;  Surgeon: Olmsted Fonda, MD;  Location: [facility];  Service: Orthopedics;  Laterality: Left;    Family History  Problem Relation Age of Onset   Cancer Mother        breast cancer - per medical history form dated 06/13/10.   Diverticulitis Mother    Hypertension Mother    Cancer Father        skin - per medical history form dated 06/13/10.   Hypertension Father    Anesthesia problems Neg Hx    Hypotension Neg Hx    Malignant hyperthermia Neg Hx    Pseudochol deficiency Neg Hx    Colon cancer Neg Hx    Social History:  reports that he quit smoking about 5 years ago. His smoking use included cigarettes. He started smoking about 23 years ago. He has a 9 pack-year smoking history. He has never used smokeless tobacco. He reports that he does not drink alcohol  and does not use drugs.  Allergies:  Allergies  Allergen Reactions   Dust Mite Mixed Allergen Ext [Mite (D. Farinae)] Anaphylaxis and Hives   Other Hives and Shortness Of Breath    Cockroaches   Iohexol  Hives   Almond (Diagnostic) Hives   Crestor  [Rosuvastatin ] Other (See Comments)    Myalgias  Arthralgias   Oysters [Shellfish Allergy] Hives   Wheat Hives   Yeast-Derived Drug Products Hives    Facility-Administered Medications Prior to Admission  Medication Dose Route Frequency Provider Last Rate Last Admin   omalizumab  (XOLAIR ) injection 300 mg  300 mg Subcutaneous Q28 days Iva Marty Saltness, MD   300 mg at 07/09/19 1229   Medications Prior to Admission  Medication Sig Dispense Refill   chlorthalidone  (HYGROTON ) 25 MG tablet Take 1 tablet (25 mg total) by mouth daily. (Patient taking differently: Take 25 mg by mouth daily with lunch.)     empagliflozin  (JARDIANCE ) 25 MG TABS tablet Take 25 mg by mouth  daily with breakfast.     EPINEPHrine  (EPIPEN ) 0.3 mg/0.3 mL SOAJ Inject 0.3 mLs (0.3 mg total) into the muscle as needed. 1 Device 3   gabapentin  (NEURONTIN ) 300 MG capsule Take 300 mg by mouth in the morning and at bedtime. (Afternoon & at bedtime)     levothyroxine  (SYNTHROID ) 50 MCG tablet Take 50 mcg by mouth daily in the afternoon.     levothyroxine  (SYNTHROID , LEVOTHROID) 300 MCG tablet Take 300 mcg by mouth daily before breakfast.     losartan  (COZAAR ) 100 MG tablet Take 1 tablet (100 mg total) by mouth daily.     Menthol , Topical Analgesic, 7.5 % (Roll) MISC Apply 1 each topically as needed (neck/back pain.).     metFORMIN  (GLUCOPHAGE -XR) 750 MG 24 hr tablet Take 1 tablet (750 mg  total) by mouth daily with breakfast. 30 tablet 2   mirtazapine  (REMERON ) 15 MG tablet Take 7.5 mg by mouth at bedtime.     naloxone (NARCAN) nasal spray 4 mg/0.1 mL Place 1 spray into the nose as needed (opioid reversal).     ondansetron  (ZOFRAN -ODT) 4 MG disintegrating tablet Take 1 tablet (4 mg total) by mouth every 8 (eight) hours as needed. 20 tablet 6   pantoprazole  (PROTONIX ) 40 MG tablet Take 40 mg by mouth daily before breakfast.     tamsulosin  (FLOMAX ) 0.4 MG CAPS capsule Take 0.4 mg by mouth in the morning.     tapentadol (NUCYNTA) 50 MG tablet Take 50 mg by mouth 2 (two) times daily as needed (pain.).     tiZANidine  (ZANAFLEX ) 4 MG tablet Take 4 mg by mouth at bedtime.     traMADol (ULTRAM) 50 MG tablet Take 50 mg by mouth every 6 (six) hours as needed (pain).     diphenhydrAMINE  (BENADRYL ) 25 MG tablet Take 25-50 mg by mouth 2 (two) times daily as needed for allergies.     Glucose Blood (BLOOD GLUCOSE TEST STRIPS) STRP 1 each by In Vitro route in the morning, at noon, and at bedtime. May substitute to any manufacturer covered by patient's insurance. 100 strip 0   Meclizine  HCl 25 MG CHEW Chew 12.5 mg by mouth 2 (two) times daily as needed (dizziness).      Results for orders placed or performed  during the hospital encounter of 03/10/24 (from the past 48 hours)  Glucose, capillary     Status: Abnormal   Collection Time: 03/10/24 11:49 AM  Result Value Ref Range   Glucose-Capillary 160 (H) 70 - 99 mg/dL    Comment: Glucose reference range applies only to samples taken after fasting for at least 8 hours.   No results found.  Review of Systems  Musculoskeletal:  Positive for neck pain.  Neurological:  Positive for weakness and numbness.    Blood pressure (!) 144/87, pulse 75, temperature 98.4 F (36.9 C), temperature source Oral, resp. rate 18, height 6' (1.829 m), weight 101.2 kg, SpO2 95%. Physical Exam HENT:     Head: Normocephalic.     Right Ear: Tympanic membrane normal.     Nose: Nose normal.  Cardiovascular:     Rate and Rhythm: Normal rate.     Pulses: Normal pulses.  Pulmonary:     Effort: Pulmonary effort is normal.  Musculoskeletal:        General: Normal range of motion.     Cervical back: Normal range of motion.  Skin:    General: Skin is warm.  Neurological:     Mental Status: He is alert.     Comments: Strength 5 out of 5 deltoid, bicep, tricep, wrist flexion, wrist extension, hand intrinsics.      Assessment/Plan 58 year old woman presents for ACDF C3-4  Arley SHAUNNA Helling, MD 03/10/2024, 12:58 PM

## 2024-03-10 NOTE — Anesthesia Procedure Notes (Signed)
 Procedure Name: Intubation Date/Time: 03/10/2024 2:02 PM  Performed by: Scherrie Mast, CRNAPre-anesthesia Checklist: Patient identified, Emergency Drugs available, Suction available and Patient being monitored Patient Re-evaluated:Patient Re-evaluated prior to induction Oxygen Delivery Method: Circle System Utilized Preoxygenation: Pre-oxygenation with 100% oxygen Induction Type: IV induction Ventilation: Mask ventilation without difficulty Laryngoscope Size: Glidescope and 4 Grade View: Grade I Tube type: Oral Tube size: 7.0 mm Number of attempts: 1 Airway Equipment and Method: Stylet and Oral airway Placement Confirmation: ETT inserted through vocal cords under direct vision, positive ETCO2 and breath sounds checked- equal and bilateral Secured at: 22 cm Tube secured with: Tape Dental Injury: Teeth and Oropharynx as per pre-operative assessment

## 2024-03-10 NOTE — Op Note (Signed)
 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  Postoperative diagnosis: Same.  Procedure: Anterior cervical discectomy fusion C3-4 utilizing globus titanium cages packed with locally harvested autograft mixed with Vivigen and anterior cervical plating utilizing the globus resonate plating system.  Surgeon: Arley helling.  Assistant: Suzen Click.  Anesthesia: General.  EBL: Minimal.  HPI: 58 year old gentleman progressive worsening neck bilateral shoulder and arm pain with numbness in his hands workup revealed severe cord compression at C3-4 above the level of previous fusion.  Due to the patient's progression of clinical syndrome imaging findings failed conservative treatment I recommended anterior cervical discectomy and fusion at that level.  I extensively reviewed the risks and benefits of the operation with the patient as well as perioperative course expectations of outcome and alternatives to surgery and he understood and agreed to proceed forward.  Operative procedure: Patient was brought into the OR was induced under general anesthesia positioned supine the neck in slight extension 5 pounds of halter traction.  The right side of his neck was prepped and draped in routine sterile fashion preoperative x-ray localized the appropriate level so a curvilinear incision was made just off the midline to the anterior border of the sternocleidomastoid and the superficial abscess was dissected out divided longitudinally.  The avascular plane between the sternomastoid and strap musculature was developed down to the prevertebral fascia and prevertebral fascia was dissected with Kitners.  The oh plate was immediately identified so I dissected above this reflected the longus coli laterally and self-retaining retractor was placed.  Disc base was incised cleaned out with high-speed drill and upgoing curettes and under microscopic illumination  posterior spurring was drilled down and then under biting both endplates allowed identification of posterior large ligament was removed in piecemeal fashion.  Several large fibers of disc that have migrated subligamentous removed from causing compression of the spinal cord.  Aggressive under biting both endplates also removed large posterior spurring also causing compression.  Marching laterally both C4 pedicles were identified both C4 nerve roots were decompressed and skeletonized flush with the pedicle.  I then sized up a 6 mm 0 degree titanium cage packed with locally harvested autograft mixed with Vivigen and inserted it 2 mm deep anterior to byline.  Packed some additional bone graft material around the cage and selected a 14 mm globus resonate plate and under fluoroscopy drilled and placed the plate above the previous existing plate all screws had excellent purchase locking mechanism was engaged and fluoroscopy confirmed good position.  Wounds and copiously irrigated taken hemostasis was maintained the platysma was reapproximated interrupted Vicryl and skin was closed running 4 subcuticular.  At the end the case all needle count sponge counts were correct patient recovery in stable condition.

## 2024-03-10 NOTE — Plan of Care (Signed)
   Problem: Education: Goal: Knowledge of General Education information will improve Description Including pain rating scale, medication(s)/side effects and non-pharmacologic comfort measures Outcome: Progressing

## 2024-03-10 NOTE — Transfer of Care (Signed)
 Immediate Anesthesia Transfer of Care Note  Patient: Andrew Love  Procedure(s) Performed: ANTERIOR CERVICAL DECOMPRESSION/DISCECTOMY FUSION 1 LEVEL (Spine Cervical)  Patient Location: PACU  Anesthesia Type:General  Level of Consciousness: awake and alert   Airway & Oxygen Therapy: Patient Spontanous Breathing and Patient connected to nasal cannula oxygen  Post-op Assessment: Report given to RN, Post -op Vital signs reviewed and stable, and Patient moving all extremities  Post vital signs: Reviewed, stable  Last Vitals:  Vitals Value Taken Time  BP 169/95 03/10/24 15:49  Temp    Pulse 67 03/10/24 15:51  Resp 16 03/10/24 15:51  SpO2 95 % 03/10/24 15:51  Vitals shown include unfiled device data.  Last Pain:  Vitals:   03/10/24 1158  TempSrc:   PainSc: 8       Patients Stated Pain Goal: 3 (03/10/24 1158)  Complications: No notable events documented.

## 2024-03-11 ENCOUNTER — Encounter (HOSPITAL_COMMUNITY): Payer: Self-pay | Admitting: Neurosurgery

## 2024-03-11 LAB — GLUCOSE, CAPILLARY: Glucose-Capillary: 272 mg/dL — ABNORMAL HIGH (ref 70–99)

## 2024-03-11 MED ORDER — TIZANIDINE HCL 4 MG PO TABS: 4.0000 mg | ORAL_TABLET | Freq: Every day | ORAL | 0 refills | Status: AC

## 2024-03-11 MED ORDER — HYDROCODONE-ACETAMINOPHEN 5-325 MG PO TABS: 1.0000 | ORAL_TABLET | ORAL | 0 refills | Status: AC | PRN

## 2024-03-11 MED ORDER — HYDROCODONE-ACETAMINOPHEN 5-325 MG PO TABS: 1.0000 | ORAL_TABLET | ORAL | Status: DC | PRN

## 2024-03-11 MED FILL — Thrombin For Soln 5000 Unit: CUTANEOUS | Qty: 2 | Status: AC

## 2024-03-11 NOTE — Progress Notes (Signed)
 Patient ID: Andrew Love, male   DOB: 05-19-1965, 58 y.o.   MRN: [id number]  Patient is alert and oriented. Surgical site clean, dry and intact. An After Visit Summary was printed and given to the patient. Discharge instructions explained. No DME required.  Verdie JONETTA Collier, RN

## 2024-03-11 NOTE — Evaluation (Signed)
 Occupational Therapy Evaluation Patient Details Name: Andrew Love MRN: [id number] DOB: Jul 03, 1965 Today's Date: 03/11/2024   History of Present Illness   Andrew Love is a 58 yo male who is s/p ACDF C3-4 11/3. PMHx: anxiety, arthritis, CAD, CKD, DMII, HTN, hypothyroidism, OA, psoriasis, sleep apnea     Clinical Impressions Andrew Love was evaluated s/p the above spine surgery. He is indep/mod I with intermittent use of SPC at baseline. Upon evaluation pt was limited by surgical pain, cervical precautions and decreased activity tolerance. Overall he demonstrated mod I ability to complete ADLs and mobility without DME. Provided cues and education on spinal precautions and compensatory techniques throughout, handout provided and pt demonstrated great recall during ADLs and mobility. Pt does not require further acute OT services. Recommend d/c home with support of family.       If plan is discharge home, recommend the following:   Assistance with cooking/housework;Assist for transportation     Functional Status Assessment   Patient has had a recent decline in their functional status and demonstrates the ability to make significant improvements in function in a reasonable and predictable amount of time.     Equipment Recommendations   None recommended by OT      Precautions/Restrictions   Precautions Precautions: Fall;Cervical Precaution Booklet Issued: Yes (comment) Restrictions Weight Bearing Restrictions Per Provider Order: No     Mobility Bed Mobility Overal bed mobility: Modified Independent             General bed mobility comments: log roll, pt has an adjustable bed and lift chair at home    Transfers Overall transfer level: Needs assistance Equipment used: None Transfers: Sit to/from Stand Sit to Stand: Modified independent (Device/Increase time)                  Balance Overall balance assessment: No apparent balance deficits (not formally  assessed)                                         ADL either performed or assessed with clinical judgement   ADL Overall ADL's : Modified independent                                       General ADL Comments: mod I after review of compensatory techniques     Vision Baseline Vision/History: 0 No visual deficits Vision Assessment?: No apparent visual deficits     Perception Perception: Not tested       Praxis Praxis: Not tested       Pertinent Vitals/Pain Pain Assessment Pain Assessment: Faces Faces Pain Scale: Hurts little more     Extremity/Trunk Assessment Upper Extremity Assessment Upper Extremity Assessment: Overall WFL for tasks assessed;Left hand dominant (improved post op)   Lower Extremity Assessment Lower Extremity Assessment: Overall WFL for tasks assessed   Cervical / Trunk Assessment Cervical / Trunk Assessment: Neck Surgery   Communication Communication Communication: No apparent difficulties   Cognition Arousal: Alert Behavior During Therapy: WFL for tasks assessed/performed Cognition: No apparent impairments                               Following commands: Intact       Cueing  General Comments   Cueing  Techniques: Verbal cues  VSS on RA   Exercises     Shoulder Instructions      Home Living Family/patient expects to be discharged to:: Private residence Living Arrangements: Spouse/significant other;Children Available Help at Discharge: Family;Available 24 hours/day Type of Home: House Home Access: Level entry     Home Layout: One level     Bathroom Shower/Tub: Producer, Television/film/video: Handicapped height Bathroom Accessibility: Yes   Home Equipment: Agricultural Consultant (2 wheels);BSC/3in1;Shower seat;Grab bars - tub/shower          Prior Functioning/Environment Prior Level of Function : Independent/Modified Independent;Driving;History of Falls (last six months)              Mobility Comments: one recent fall, uses SPC intermittently ADLs Comments: retired, indep    OT Problem List: Decreased range of motion;Decreased activity tolerance        OT Goals(Current goals can be found in the care plan section)   Acute Rehab OT Goals Patient Stated Goal: home OT Goal Formulation: With patient Time For Goal Achievement: 03/11/24 Potential to Achieve Goals: Good   OT Frequency:       Co-evaluation              AM-PAC OT 6 Clicks Daily Activity     Outcome Measure Help from another person eating meals?: None Help from another person taking care of personal grooming?: None Help from another person toileting, which includes using toliet, bedpan, or urinal?: None Help from another person bathing (including washing, rinsing, drying)?: None Help from another person to put on and taking off regular upper body clothing?: None Help from another person to put on and taking off regular lower body clothing?: None 6 Click Score: 24   End of Session Nurse Communication: Mobility status  Activity Tolerance: Patient tolerated treatment well Patient left: in bed;with call bell/phone within reach;with family/visitor present  OT Visit Diagnosis: Unsteadiness on feet (R26.81);Other abnormalities of gait and mobility (R26.89);Muscle weakness (generalized) (M62.81);Pain                Time: 9194-9168 OT Time Calculation (min): 26 min Charges:  OT General Charges $OT Visit: 1 Visit OT Evaluation $OT Eval Low Complexity: 1 Low  Lucie Kendall, OTR/L Acute Rehabilitation Services Office [phone] Secure Chat Communication Preferred   Lucie JONETTA Kendall 03/11/2024, 9:21 AM

## 2024-03-11 NOTE — Anesthesia Postprocedure Evaluation (Signed)
 Anesthesia Post Note  Patient: Andrew Love  Procedure(s) Performed: ANTERIOR CERVICAL DECOMPRESSION/DISCECTOMY FUSION 1 LEVEL (Spine Cervical)     Patient location during evaluation: PACU Anesthesia Type: General Level of consciousness: awake Pain management: pain level controlled Vital Signs Assessment: post-procedure vital signs reviewed and stable Respiratory status: spontaneous breathing, nonlabored ventilation and respiratory function stable Cardiovascular status: blood pressure returned to baseline and stable Postop Assessment: no apparent nausea or vomiting Anesthetic complications: no   No notable events documented.  Last Vitals:  Vitals:   03/10/24 2315 03/11/24 0441  BP: 111/71 118/81  Pulse: 66 66  Resp: 18 18  Temp: 36.6 C 37.2 C  SpO2: 94% 92%    Last Pain:  Vitals:   03/11/24 0455  TempSrc:   PainSc: 9                  Jerritt Cardoza P Wadie Mattie

## 2024-03-11 NOTE — Discharge Summary (Addendum)
 Physician Discharge Summary  Patient ID: SATVIK PARCO MRN: [id number] DOB/AGE: 1965/09/25 58 y.o. Estimated body mass index is 30.24 kg/m as calculated from the following:   Height as of this encounter: 6' (1.829 m).   Weight as of this encounter: 101.2 kg.   Admit date: 03/10/2024 Discharge date: 03/11/2024  Admission Diagnoses: Cervical spondylitic myelopathy  Discharge Diagnoses: Same Principal Problem:   Myelopathy [facility])   Discharged Condition: good  Hospital Course: Patient was admitted to hospital underwent anterior cervical discectomy and fusion at C3-4.  Postoperatively patient did very well In the floor the floor was ambulating voiding spontaneously tolerating regular diet and stable for discharge home.  Patient be discharged with scheduled follow-up in 1 to 2 weeks.  Consults: Significant Diagnostic Studies: Treatments: ACDF C3-4 Discharge Exam: Blood pressure 117/73, pulse 65, temperature 98.9 F (37.2 C), resp. rate 17, height 6' (1.829 m), weight 101.2 kg, SpO2 94%. Strength 5 out of 5 wound clean dry and intact  Disposition: Home      Signed: DANISH RUFFINS 03/11/2024, 7:35 AM

## 2024-04-14 ENCOUNTER — Other Ambulatory Visit: Payer: Self-pay

## 2024-04-14 ENCOUNTER — Encounter (HOSPITAL_COMMUNITY): Payer: Self-pay | Admitting: Emergency Medicine

## 2024-04-14 ENCOUNTER — Emergency Department (HOSPITAL_COMMUNITY)
Admission: EM | Admit: 2024-04-14 | Discharge: 2024-04-15 | Disposition: A | Discharge status: Home / Self Care | Source: Ambulatory Visit | Attending: Emergency Medicine | Admitting: Emergency Medicine

## 2024-04-14 DIAGNOSIS — R109 Unspecified abdominal pain: Secondary | ICD-10-CM | POA: Diagnosis not present

## 2024-04-14 DIAGNOSIS — I2510 Atherosclerotic heart disease of native coronary artery without angina pectoris: Secondary | ICD-10-CM | POA: Diagnosis not present

## 2024-04-14 DIAGNOSIS — Z79899 Other long term (current) drug therapy: Secondary | ICD-10-CM | POA: Diagnosis not present

## 2024-04-14 DIAGNOSIS — R410 Disorientation, unspecified: Secondary | ICD-10-CM | POA: Diagnosis present

## 2024-04-14 DIAGNOSIS — R6889 Other general symptoms and signs: Secondary | ICD-10-CM

## 2024-04-14 DIAGNOSIS — I129 Hypertensive chronic kidney disease with stage 1 through stage 4 chronic kidney disease, or unspecified chronic kidney disease: Secondary | ICD-10-CM | POA: Diagnosis not present

## 2024-04-14 DIAGNOSIS — R531 Weakness: Secondary | ICD-10-CM

## 2024-04-14 DIAGNOSIS — R110 Nausea: Secondary | ICD-10-CM | POA: Diagnosis not present

## 2024-04-14 DIAGNOSIS — R0602 Shortness of breath: Secondary | ICD-10-CM | POA: Diagnosis not present

## 2024-04-14 DIAGNOSIS — R42 Dizziness and giddiness: Secondary | ICD-10-CM | POA: Diagnosis not present

## 2024-04-14 DIAGNOSIS — E876 Hypokalemia: Secondary | ICD-10-CM

## 2024-04-14 DIAGNOSIS — N189 Chronic kidney disease, unspecified: Secondary | ICD-10-CM | POA: Diagnosis not present

## 2024-04-14 DIAGNOSIS — R3589 Other polyuria: Secondary | ICD-10-CM | POA: Diagnosis not present

## 2024-04-14 DIAGNOSIS — E1122 Type 2 diabetes mellitus with diabetic chronic kidney disease: Secondary | ICD-10-CM | POA: Diagnosis not present

## 2024-04-14 DIAGNOSIS — Z7984 Long term (current) use of oral hypoglycemic drugs: Secondary | ICD-10-CM | POA: Diagnosis not present

## 2024-04-14 DIAGNOSIS — R202 Paresthesia of skin: Secondary | ICD-10-CM | POA: Diagnosis not present

## 2024-04-14 LAB — I-STAT CHEM 8, ED
BUN: 21 mg/dL — ABNORMAL HIGH (ref 6–20)
Calcium, Ion: 1.17 mmol/L (ref 1.15–1.40)
Chloride: 95 mmol/L — ABNORMAL LOW (ref 98–111)
Creatinine, Ser: 1 mg/dL (ref 0.61–1.24)
Glucose, Bld: 121 mg/dL — ABNORMAL HIGH (ref 70–99)
HCT: 44 % (ref 39.0–52.0)
Hemoglobin: 15 g/dL (ref 13.0–17.0)
Potassium: 3 mmol/L — ABNORMAL LOW (ref 3.5–5.1)
Sodium: 134 mmol/L — ABNORMAL LOW (ref 135–145)
TCO2: 25 mmol/L (ref 22–32)

## 2024-04-14 LAB — AMMONIA: Ammonia: 30 umol/L (ref 9–35)

## 2024-04-14 LAB — CBC WITH DIFFERENTIAL/PLATELET
Abs Immature Granulocytes: 0.02 K/uL (ref 0.00–0.07)
Basophils Absolute: 0.1 K/uL (ref 0.0–0.1)
Basophils Relative: 1 %
Eosinophils Absolute: 0.3 K/uL (ref 0.0–0.5)
Eosinophils Relative: 3 %
HCT: 42.7 % (ref 39.0–52.0)
Hemoglobin: 15.7 g/dL (ref 13.0–17.0)
Immature Granulocytes: 0 %
Lymphocytes Relative: 32 %
Lymphs Abs: 3 K/uL (ref 0.7–4.0)
MCH: 31.8 pg (ref 26.0–34.0)
MCHC: 36.8 g/dL — ABNORMAL HIGH (ref 30.0–36.0)
MCV: 86.4 fL (ref 80.0–100.0)
Monocytes Absolute: 0.7 K/uL (ref 0.1–1.0)
Monocytes Relative: 7 %
Neutro Abs: 5.3 K/uL (ref 1.7–7.7)
Neutrophils Relative %: 57 %
Platelets: 273 K/uL (ref 150–400)
RBC: 4.94 MIL/uL (ref 4.22–5.81)
RDW: 11.9 % (ref 11.5–15.5)
WBC: 9.3 K/uL (ref 4.0–10.5)
nRBC: 0 % (ref 0.0–0.2)

## 2024-04-14 LAB — COMPREHENSIVE METABOLIC PANEL WITH GFR
ALT: 29 U/L (ref 0–44)
AST: 20 U/L (ref 15–41)
Albumin: 4.6 g/dL (ref 3.5–5.0)
Alkaline Phosphatase: 88 U/L (ref 38–126)
Anion gap: 11 (ref 5–15)
BUN: 20 mg/dL (ref 6–20)
CO2: 28 mmol/L (ref 22–32)
Calcium: 9.4 mg/dL (ref 8.9–10.3)
Chloride: 95 mmol/L — ABNORMAL LOW (ref 98–111)
Creatinine, Ser: 0.82 mg/dL (ref 0.61–1.24)
GFR, Estimated: >60 mL/min (ref >60)
Glucose, Bld: 117 mg/dL — ABNORMAL HIGH (ref 70–99)
Potassium: 3.1 mmol/L — ABNORMAL LOW (ref 3.5–5.1)
Sodium: 134 mmol/L — ABNORMAL LOW (ref 135–145)
Total Bilirubin: 0.4 mg/dL (ref 0.0–1.2)
Total Protein: 7.3 g/dL (ref 6.5–8.1)

## 2024-04-14 LAB — URINALYSIS, ROUTINE W REFLEX MICROSCOPIC
Bacteria, UA: NONE SEEN
Bilirubin Urine: NEGATIVE
Glucose, UA: >=500 mg/dL — AB
Hgb urine dipstick: NEGATIVE
Ketones, ur: NEGATIVE mg/dL
Leukocytes,Ua: NEGATIVE
Nitrite: NEGATIVE
Protein, ur: NEGATIVE mg/dL
Specific Gravity, Urine: 1.02 (ref 1.005–1.030)
pH: 6 (ref 5.0–8.0)

## 2024-04-14 LAB — MAGNESIUM: Magnesium: 2.2 mg/dL (ref 1.7–2.4)

## 2024-04-14 LAB — RESP PANEL BY RT-PCR (RSV, FLU A&B, COVID)  RVPGX2
Influenza A by PCR: NEGATIVE
Influenza B by PCR: NEGATIVE
Resp Syncytial Virus by PCR: NEGATIVE
SARS Coronavirus 2 by RT PCR: NEGATIVE

## 2024-04-14 LAB — LIPASE, BLOOD: Lipase: 42 U/L (ref 11–51)

## 2024-04-14 LAB — TROPONIN T, HIGH SENSITIVITY
Troponin T High Sensitivity: <15 ng/L (ref 0–19)
Troponin T High Sensitivity: <15 ng/L (ref 0–19)

## 2024-04-14 LAB — PRO BRAIN NATRIURETIC PEPTIDE: Pro Brain Natriuretic Peptide: <50 pg/mL (ref <300.0)

## 2024-04-14 MED ORDER — METHYLPREDNISOLONE SODIUM SUCC 40 MG IJ SOLR
40.0000 mg | Freq: Once | INTRAMUSCULAR | Status: AC
  Administered 2024-04-14: 40 mg via INTRAVENOUS
  Filled 2024-04-14: qty 1

## 2024-04-14 MED ORDER — OXYCODONE-ACETAMINOPHEN 5-325 MG PO TABS
1.0000 | ORAL_TABLET | Freq: Once | ORAL | Status: AC
  Administered 2024-04-14: 1 via ORAL
  Filled 2024-04-14: qty 1

## 2024-04-14 MED ORDER — POTASSIUM CHLORIDE CRYS ER 20 MEQ PO TBCR
40.0000 meq | EXTENDED_RELEASE_TABLET | Freq: Once | ORAL | Status: AC
  Administered 2024-04-14: 40 meq via ORAL
  Filled 2024-04-14: qty 2

## 2024-04-14 MED ORDER — DIPHENHYDRAMINE HCL 50 MG/ML IJ SOLN: 50.0000 mg | Freq: Once | INTRAMUSCULAR | Status: AC

## 2024-04-14 MED ORDER — DIPHENHYDRAMINE HCL 25 MG PO CAPS
50.0000 mg | ORAL_CAPSULE | Freq: Once | ORAL | Status: AC
  Administered 2024-04-14: 50 mg via ORAL
  Filled 2024-04-14: qty 2

## 2024-04-14 NOTE — ED Provider Notes (Signed)
 10:57 PM Assumed care from Dr. Melvenia, please see their note for full history, physical and decision making until this point. In brief this is a 58 y.o. year old male who presented to the ED tonight with Altered Mental Status     Multiple symptoms. Pending scans for disposition. Likely d/c. Has nsg follow up scheduled.   Workup reassuring. No obvious causes for symptoms. Long conversation with him regarding possible etiologies but I suspect it is multifactorial with recent surgery/steroids, hypokalemia, hypothyroidism.   Discharge instructions, including strict return precautions for new or worsening symptoms, given. Patient and/or family verbalized understanding and agreement with the plan as described.   Labs, studies and imaging reviewed by myself and considered in medical decision making if ordered. Imaging interpreted by radiology.  Labs Reviewed  COMPREHENSIVE METABOLIC PANEL WITH GFR - Abnormal; Notable for the following components:      Result Value   Sodium 134 (*)    Potassium 3.1 (*)    Chloride 95 (*)    Glucose, Bld 117 (*)    All other components within normal limits  CBC WITH DIFFERENTIAL/PLATELET - Abnormal; Notable for the following components:   MCHC 36.8 (*)    All other components within normal limits  URINALYSIS, ROUTINE W REFLEX MICROSCOPIC - Abnormal; Notable for the following components:   Color, Urine STRAW (*)    Glucose, UA >=500 (*)    All other components within normal limits  I-STAT CHEM 8, ED - Abnormal; Notable for the following components:   Sodium 134 (*)    Potassium 3.0 (*)    Chloride 95 (*)    BUN 21 (*)    Glucose, Bld 121 (*)    All other components within normal limits  RESP PANEL BY RT-PCR (RSV, FLU A&B, COVID)  RVPGX2  PRO BRAIN NATRIURETIC PEPTIDE  MAGNESIUM   AMMONIA  LIPASE, BLOOD  T3, FREE  T4, FREE  TROPONIN T, HIGH SENSITIVITY  TROPONIN T, HIGH SENSITIVITY    CT Head Wo Contrast    (Results Pending)  CT Cervical Spine Wo  Contrast    (Results Pending)  CT Angio Chest PE W and/or Wo Contrast    (Results Pending)  CT ABDOMEN PELVIS W CONTRAST    (Results Pending)    No follow-ups on file.    Pravin Perezperez, Selinda, MD 04/15/24 [phone]

## 2024-04-14 NOTE — ED Provider Notes (Signed)
 [place] EMERGENCY DEPARTMENT AT [facility] Provider Note   CSN: [id number] Arrival date & time: 04/14/24  2017     Patient presents with: Altered Mental Status   Andrew Love is a 58 y.o. male.    Altered Mental Status Presenting symptoms: confusion   Associated symptoms: abdominal pain, light-headedness and nausea   Patient presents for complaints.  Medical history includes HTN, HLD, CAD, GERD, DM, anxiety, depression, arthritis, CKD, sleep apnea.  He is currently 5 weeks postop from ACDF of C3-4 with Dr. Onetha.  Prior to his surgery, he was having numbness, weakness, and paresthesias in extremities.  This improved after surgery but he has had some return of some extremity weakness and paresthesias.  He reports an unintentional weight loss since his surgery.  He has been trying diligently to maintain a low glycemic diet to help manage his diabetes.  He has been eating regularly.  He will experience intermittent muscle cramping.  He has been experiencing exertional shortness of breath.  He describes intermittent episodes of confusion and short-term memory loss.  He went to the [facility] on Wednesday for routine appointment.  They did do lab work there.  His notable findings from that visit were low potassium, low vitamin D, and TSH in the range of 12.  He is on Synthroid .  They were planning on increasing his dosing.  He was advised to take potassium and vitamin D supplements.  His A1c had increased despite his efforts at positive diet changes.  He was advised to discontinue his metformin  and start taking glipizide.  He has not yet filled that prescription.  This evening, he went to the grocery store.  While there, he forgot why he had come.  He went home and checked his blood sugar.  It was in the range of 130.  He called the [facility] hotline who advised him to come to the ED.  Additionally, patient describes a left-sided abdominal pain for the past 4 days.  He does have a history of  diverticulitis and has had a portion of his colon removed.  He has had some nausea this evening.     Prior to Admission medications   Medication Sig Start Date End Date Taking? Authorizing Provider  chlorthalidone  (HYGROTON ) 25 MG tablet Take 1 tablet (25 mg total) by mouth daily. Patient taking differently: Take 25 mg by mouth daily with lunch. 12/25/23   Ricky Fines, MD  diphenhydrAMINE  (BENADRYL ) 25 MG tablet Take 25-50 mg by mouth 2 (two) times daily as needed for allergies.    [provider]  empagliflozin  (JARDIANCE ) 25 MG TABS tablet Take 25 mg by mouth daily with breakfast.    [provider]  EPINEPHrine  (EPIPEN ) 0.3 mg/0.3 mL SOAJ Inject 0.3 mLs (0.3 mg total) into the muscle as needed. 12/01/12   Sheran Rogue, MD  gabapentin  (NEURONTIN ) 300 MG capsule Take 300 mg by mouth in the morning and at bedtime. (Afternoon & at bedtime) 12/20/23   [provider]  HYDROcodone -acetaminophen  (NORCO/VICODIN) 5-325 MG tablet Take 1 tablet by mouth every 4 (four) hours as needed for moderate pain (pain score 4-6). 03/11/24   Onetha Arley, MD  levothyroxine  (SYNTHROID ) 50 MCG tablet Take 50 mcg by mouth daily in the afternoon.    [provider]  levothyroxine  (SYNTHROID , LEVOTHROID) 300 MCG tablet Take 300 mcg by mouth daily before breakfast. 03/26/14   [provider]  losartan  (COZAAR ) 100 MG tablet Take 1 tablet (100 mg total) by mouth  daily. 12/25/23   Ricky Fines, MD  Meclizine  HCl 25 MG CHEW Chew 12.5 mg by mouth 2 (two) times daily as needed (dizziness).    [provider]  Menthol , Topical Analgesic, 7.5 % (Roll) MISC Apply 1 each topically as needed (neck/back pain.).    [provider]  metFORMIN  (GLUCOPHAGE -XR) 750 MG 24 hr tablet Take 1 tablet (750 mg total) by mouth daily with breakfast. 12/23/23   Ricky Fines, MD  methylPREDNISolone  (MEDROL  DOSEPAK) 4 MG TBPK tablet Take 4 mg by mouth as directed. 03/18/24   [provider]  mirtazapine  (REMERON ) 15 MG tablet Take 7.5 mg by mouth at bedtime. 07/18/22   [provider]  naloxone  (NARCAN ) nasal spray 4 mg/0.1 mL Place 1 spray into the nose as needed (opioid reversal). 07/18/21   [provider]  ondansetron  (ZOFRAN ) 8 MG tablet Take 8 mg by mouth 2 (two) times daily. 03/11/24   [provider]  ondansetron  (ZOFRAN -ODT) 4 MG disintegrating tablet Take 1 tablet (4 mg total) by mouth every 8 (eight) hours as needed. 12/07/23   Onita Duos, MD  pantoprazole  (PROTONIX ) 40 MG tablet Take 40 mg by mouth daily before breakfast.    [provider]  tamsulosin  (FLOMAX ) 0.4 MG CAPS capsule Take 0.4 mg by mouth in the morning.    [provider]  tapentadol (NUCYNTA) 50 MG tablet Take 50 mg by mouth 2 (two) times daily as needed (pain.).    [provider]  tiZANidine  (ZANAFLEX ) 2 MG tablet Take 2 mg by mouth every 6 (six) hours as needed. 04/07/24   [provider]  tiZANidine  (ZANAFLEX ) 4 MG tablet Take 1 tablet (4 mg total) by mouth at bedtime. 03/11/24   Onetha Kuba, MD  traMADol  (ULTRAM ) 50 MG tablet Take 50 mg by mouth every 6 (six) hours as needed (pain).    [provider]    Allergies: Dust mite mixed allergen ext [mite (d. farinae)], Other, Crestor  [rosuvastatin ], Almond (diagnostic), Iohexol , Oysters [shellfish allergy], Wheat, and Yeast-derived drug products    Review of Systems  Constitutional:  Positive for fatigue and unexpected weight change.  Respiratory:  Positive for shortness of breath (Exertional).   Gastrointestinal:  Positive for abdominal pain and nausea.  Endocrine: Positive for polyuria.  Neurological:  Positive for light-headedness.  Psychiatric/Behavioral:  Positive for confusion.   All other systems reviewed and are negative.   Updated Vital Signs BP 108/77   Pulse 85   Temp 97.8 F (36.6 C) (Oral)   Resp 18   Ht 6' (1.829 m)   Wt 100 kg   SpO2 91%   BMI 29.90  kg/m   Physical Exam Vitals and nursing note reviewed.  Constitutional:      General: He is not in acute distress.    Appearance: Normal appearance. He is well-developed. He is not ill-appearing, toxic-appearing or diaphoretic.  HENT:     Head: Normocephalic and atraumatic.     Right Ear: External ear normal.     Left Ear: External ear normal.     Nose: Nose normal.     Mouth/Throat:     Mouth: Mucous membranes are moist.  Eyes:     Extraocular Movements: Extraocular movements intact.     Conjunctiva/sclera: Conjunctivae normal.  Cardiovascular:     Rate and Rhythm: Normal rate and regular rhythm.  Pulmonary:     Effort: Pulmonary effort is normal. No respiratory distress.  Abdominal:     General: There is no  distension.     Palpations: Abdomen is soft.  Musculoskeletal:        General: No swelling. Normal range of motion.     Cervical back: Normal range of motion and neck supple.  Skin:    General: Skin is warm and dry.     Coloration: Skin is not jaundiced or pale.  Neurological:     Mental Status: He is alert and oriented to person, place, and time.     Comments: Patient endorses mild diminished sensation to right upper extremity.  He has some mild weakness in left lower extremity, when compared to the right.  Psychiatric:        Mood and Affect: Mood normal.        Behavior: Behavior normal.     (all labs ordered are listed, but only abnormal results are displayed) Labs Reviewed  COMPREHENSIVE METABOLIC PANEL WITH GFR - Abnormal; Notable for the following components:      Result Value   Sodium 134 (*)    Potassium 3.1 (*)    Chloride 95 (*)    Glucose, Bld 117 (*)    All other components within normal limits  CBC WITH DIFFERENTIAL/PLATELET - Abnormal; Notable for the following components:   MCHC 36.8 (*)    All other components within normal limits  URINALYSIS, ROUTINE W REFLEX MICROSCOPIC - Abnormal; Notable for the following components:   Color, Urine STRAW  (*)    Glucose, UA >=500 (*)    All other components within normal limits  I-STAT CHEM 8, ED - Abnormal; Notable for the following components:   Sodium 134 (*)    Potassium 3.0 (*)    Chloride 95 (*)    BUN 21 (*)    Glucose, Bld 121 (*)    All other components within normal limits  RESP PANEL BY RT-PCR (RSV, FLU A&B, COVID)  RVPGX2  PRO BRAIN NATRIURETIC PEPTIDE  MAGNESIUM   AMMONIA  LIPASE, BLOOD  T3, FREE  T4, FREE  TROPONIN T, HIGH SENSITIVITY    EKG: None  Radiology: No results found.   Procedures   Medications Ordered in the ED  diphenhydrAMINE  (BENADRYL ) capsule 50 mg (has no administration in time range)    Or  diphenhydrAMINE  (BENADRYL ) injection 50 mg (has no administration in time range)  potassium chloride  SA (KLOR-CON  M) CR tablet 40 mEq (has no administration in time range)  oxyCODONE -acetaminophen  (PERCOCET/ROXICET) 5-325 MG per tablet 1 tablet (has no administration in time range)  methylPREDNISolone  sodium succinate  (SOLU-MEDROL ) 40 mg/mL injection 40 mg (40 mg Intravenous Given 04/14/24 2115)                                    Medical Decision Making Amount and/or Complexity of Data Reviewed Labs: ordered. Radiology: ordered.  Risk Prescription drug management.   This patient presents to the ED for concern of multiple complaints, this involves an extensive number of treatment options, and is a complaint that carries with it a high risk of complications and morbidity.  The differential diagnosis includes anxiety, neoplasm, undiagnosed chronic illness, polypharmacy, metabolic derangements   Co morbidities / Chronic conditions that complicate the patient evaluation  HTN, HLD, CAD, GERD, DM, anxiety, depression, arthritis, CKD, sleep apnea   Additional history obtained:  Additional history obtained from EMR External records from outside source obtained and reviewed including patient's wife   Lab Tests:  I Ordered, and personally  interpreted labs.  The pertinent results include:  Normal hemoglobin, no leukocytosis, normal kidney function, hypokalemia with otherwise normal electrolytes, normal troponin, normal BNP   Imaging Studies ordered:  I ordered imaging studies including CT of head, cervical spine; CTA chest, CT of abdomen and pelvis I independently visualized and interpreted imaging which showed (pending at time of signout) I agree with the radiologist interpretation   Cardiac Monitoring: / EKG:  The patient was maintained on a cardiac monitor.  I personally viewed and interpreted the cardiac monitored which showed an underlying rhythm of: Sinus rhythm   Problem List / ED Course / Critical interventions / Medication management  Patient presenting for multiple complaints.  On arrival in the ED, vital signs notable for moderate hypertension.  Patient is well-appearing on exam.  He describes an extensive range of recent symptoms over the past month.  This includes recurrence of some weakness and paresthesias following his ACDF surgery 5 weeks ago.  On exam, he has some mildly diminished sensation to his right upper extremity.  He has some mild left lower extremity weakness.  He does have a follow-up with his neurosurgeon tomorrow.  In terms of his other symptoms, broad workup was initiated.  Patient's lab work was assuring.  Although he is worried about chronic kidney disease, his creatinine today is 1.  He did have worsening left lower quadrant abdominal pain and Percocet was ordered.  He does have a listed allergy to contrast.  Pretreatment was ordered.  CT studies pending at time of signout.  Care of patient was signed out to oncoming ED provider. I ordered medication including Percocet for analgesia Reevaluation of the patient after these medicines showed that the patient improved I have reviewed the patients home medicines and have made adjustments as needed  Social Determinants of Health:  Has access to  outpatient care through the [facility]     Final diagnoses:  Multiple complaints    ED Discharge Orders     None          Melvenia Motto, MD 04/14/24 2218

## 2024-04-14 NOTE — ED Triage Notes (Signed)
 Pt c/o altered mental status, dizziness, bilateral feet and hand numbness since cervical neck surgery in November. Pt c/o left side abd pain with nausea.

## 2024-04-15 ENCOUNTER — Emergency Department (HOSPITAL_COMMUNITY)

## 2024-04-15 LAB — TSH: TSH: 0.334 u[IU]/mL — ABNORMAL LOW (ref 0.350–4.500)

## 2024-04-15 LAB — T4, FREE: Free T4: 2.77 ng/dL — ABNORMAL HIGH (ref 0.61–1.12)

## 2024-04-15 MED ORDER — HYDROMORPHONE HCL 1 MG/ML IJ SOLN
1.0000 mg | Freq: Once | INTRAMUSCULAR | Status: AC
  Administered 2024-04-15: 1 mg via INTRAVENOUS
  Filled 2024-04-15: qty 1

## 2024-04-15 MED ORDER — POTASSIUM CHLORIDE 10 MEQ/100ML IV SOLN
10.0000 meq | Freq: Once | INTRAVENOUS | Status: AC
  Administered 2024-04-15: 10 meq via INTRAVENOUS
  Filled 2024-04-15: qty 100

## 2024-04-15 MED ORDER — IOHEXOL 350 MG/ML SOLN
100.0000 mL | Freq: Once | INTRAVENOUS | Status: AC | PRN
  Administered 2024-04-15: 100 mL via INTRAVENOUS

## 2024-04-15 NOTE — ED Notes (Signed)
 Patient to CT.

## 2024-04-15 NOTE — ED Notes (Signed)
 Patient's wife concerned patient is pale and diaphoretic, had a vagal episode while getting choke on benadryl  (issues with swallowing since surgery.)  Patient states he is still lightheaded and just doesn't feel good.  MD Mesner made aware.

## 2024-04-16 LAB — T3, FREE: T3, Free: 4 pg/mL (ref 2.0–4.4)

## 2024-04-18 ENCOUNTER — Telehealth (HOSPITAL_COMMUNITY): Payer: Self-pay

## 2024-04-18 NOTE — Telephone Encounter (Signed)
 Attempted to contact patient at [phone] to schedule swallow study. Left VM and request for call back.

## 2024-04-19 ENCOUNTER — Encounter (HOSPITAL_COMMUNITY): Payer: Self-pay | Admitting: Emergency Medicine

## 2024-04-19 ENCOUNTER — Emergency Department (HOSPITAL_COMMUNITY)
Admission: EM | Admit: 2024-04-19 | Discharge: 2024-04-20 | Disposition: A | Discharge status: Home / Self Care | Attending: Emergency Medicine | Admitting: Emergency Medicine

## 2024-04-19 ENCOUNTER — Other Ambulatory Visit: Payer: Self-pay

## 2024-04-19 DIAGNOSIS — E1122 Type 2 diabetes mellitus with diabetic chronic kidney disease: Secondary | ICD-10-CM | POA: Diagnosis not present

## 2024-04-19 DIAGNOSIS — N189 Chronic kidney disease, unspecified: Secondary | ICD-10-CM | POA: Diagnosis not present

## 2024-04-19 DIAGNOSIS — I129 Hypertensive chronic kidney disease with stage 1 through stage 4 chronic kidney disease, or unspecified chronic kidney disease: Secondary | ICD-10-CM | POA: Insufficient documentation

## 2024-04-19 DIAGNOSIS — R10A2 Flank pain, left side: Secondary | ICD-10-CM | POA: Diagnosis present

## 2024-04-19 DIAGNOSIS — Z79899 Other long term (current) drug therapy: Secondary | ICD-10-CM | POA: Diagnosis not present

## 2024-04-19 NOTE — ED Provider Notes (Signed)
 [place] EMERGENCY DEPARTMENT AT [facility] Provider Note   CSN: [id number] Arrival date & time: 04/19/24  2215     Patient presents with: Back Pain   Andrew Love is a 58 y.o. male.   The history is provided by the patient.  Back Pain  He has history of hypertension, diabetes, hyperlipidemia, chronic kidney disease, chronic back pain, GERD and comes in complaining of pain in the left flank for the last week.  Pain radiates to the left lower abdomen.  Pain is constant and not affected by anything that he does.  There has been some nausea, but he has not vomited.  He denies fever, chills, sweats.  He denies any urinary symptoms.  He did try putting Biofreeze on it without relief.  He has not taken any medication.  He denies any unusual activity or trauma.  Pain is constant and getting worse.    Prior to Admission medications  Medication Sig Start Date End Date Taking? Authorizing Provider  chlorthalidone  (HYGROTON ) 25 MG tablet Take 1 tablet (25 mg total) by mouth daily. Patient taking differently: Take 25 mg by mouth daily with lunch. 12/25/23   Ricky Fines, MD  diphenhydrAMINE  (BENADRYL ) 25 MG tablet Take 25-50 mg by mouth 2 (two) times daily as needed for allergies.    [provider]  empagliflozin  (JARDIANCE ) 25 MG TABS tablet Take 25 mg by mouth daily with breakfast.    [provider]  EPINEPHrine  (EPIPEN ) 0.3 mg/0.3 mL SOAJ Inject 0.3 mLs (0.3 mg total) into the muscle as needed. 12/01/12   Sheran Rogue, MD  gabapentin  (NEURONTIN ) 300 MG capsule Take 300 mg by mouth in the morning and at bedtime. (Afternoon & at bedtime) 12/20/23   [provider]  HYDROcodone -acetaminophen  (NORCO/VICODIN) 5-325 MG tablet Take 1 tablet by mouth every 4 (four) hours as needed for moderate pain (pain score 4-6). 03/11/24   Onetha Arley, MD  levothyroxine  (SYNTHROID ) 50 MCG tablet Take 50 mcg by mouth daily in the afternoon.    [provider]   levothyroxine  (SYNTHROID , LEVOTHROID) 300 MCG tablet Take 300 mcg by mouth daily before breakfast. 03/26/14   [provider]  losartan  (COZAAR ) 100 MG tablet Take 1 tablet (100 mg total) by mouth daily. 12/25/23   Ricky Fines, MD  Meclizine  HCl 25 MG CHEW Chew 12.5 mg by mouth 2 (two) times daily as needed (dizziness).    [provider]  Menthol , Topical Analgesic, 7.5 % (Roll) MISC Apply 1 each topically as needed (neck/back pain.).    [provider]  metFORMIN  (GLUCOPHAGE -XR) 750 MG 24 hr tablet Take 1 tablet (750 mg total) by mouth daily with breakfast. 12/23/23   Ricky Fines, MD  methylPREDNISolone  (MEDROL  DOSEPAK) 4 MG TBPK tablet Take 4 mg by mouth as directed. 03/18/24   [provider]  mirtazapine  (REMERON ) 15 MG tablet Take 7.5 mg by mouth at bedtime. 07/18/22   [provider]  naloxone  (NARCAN ) nasal spray 4 mg/0.1 mL Place 1 spray into the nose as needed (opioid reversal). 07/18/21   [provider]  ondansetron  (ZOFRAN ) 8 MG tablet Take 8 mg by mouth 2 (two) times daily. 03/11/24   [provider]  ondansetron  (ZOFRAN -ODT) 4 MG disintegrating tablet Take 1 tablet (4 mg total) by mouth every 8 (eight) hours as needed. 12/07/23   Onita Duos, MD  pantoprazole  (PROTONIX ) 40 MG tablet Take 40 mg by mouth daily before breakfast.    [provider]  tamsulosin  (  FLOMAX ) 0.4 MG CAPS capsule Take 0.4 mg by mouth in the morning.    [provider]  tapentadol (NUCYNTA) 50 MG tablet Take 50 mg by mouth 2 (two) times daily as needed (pain.).    [provider]  tiZANidine  (ZANAFLEX ) 2 MG tablet Take 2 mg by mouth every 6 (six) hours as needed. 04/07/24   [provider]  tiZANidine  (ZANAFLEX ) 4 MG tablet Take 1 tablet (4 mg total) by mouth at bedtime. 03/11/24   Onetha Kuba, MD  traMADol  (ULTRAM ) 50 MG tablet Take 50 mg by mouth every 6 (six) hours as needed (pain).    [provider]     Allergies: Dust mite mixed allergen ext [mite (d. farinae)], Other, Crestor  [rosuvastatin ], Almond (diagnostic), Iohexol , Oysters [shellfish allergy], Wheat, and Yeast-derived drug products    Review of Systems  Musculoskeletal:  Positive for back pain.  All other systems reviewed and are negative.   Updated Vital Signs BP 115/71   Pulse 87   Temp 97.7 F (36.5 C) (Oral)   Resp 16   Ht 6' (1.829 m)   Wt 100 kg   SpO2 95%   BMI 29.90 kg/m   Physical Exam Vitals and nursing note reviewed.   58 year old male, resting comfortably and in no acute distress. Vital signs are normal. Oxygen saturation is 95%, which is normal. Head is normocephalic and atraumatic. PERRLA, EOMI. Oropharynx is clear. Neck is nontender. Back is nontender in the midline.  There is mild left CVA tenderness. Lungs are clear without rales, wheezes, or rhonchi. Chest is nontender. Heart has regular rate and rhythm without murmur. Abdomen is soft, flat, with mild left lower quadrant tenderness.  There is no rebound or guarding. Extremities have no cyanosis or edema, full range of motion is present. Skin is warm and dry without rash. Neurologic: Mental status is normal, cranial nerves are intact, moves all extremities equally.  (all labs ordered are listed, but only abnormal results are displayed) Labs Reviewed  COMPREHENSIVE METABOLIC PANEL WITH GFR - Abnormal; Notable for the following components:      Result Value   Glucose, Bld 123 (*)    All other components within normal limits  URINALYSIS, W/ REFLEX TO CULTURE (INFECTION SUSPECTED) - Abnormal; Notable for the following components:   Color, Urine STRAW (*)    Glucose, UA >=500 (*)    All other components within normal limits  MAGNESIUM  - Abnormal; Notable for the following components:   Magnesium  2.5 (*)    All other components within normal limits  LIPASE, BLOOD  CBC WITH DIFFERENTIAL/PLATELET    Radiology: CT Renal Stone Study Result  Date: 04/20/2024 EXAM: CT ABDOMEN AND PELVIS WITHOUT CONTRAST 04/20/2024 01:05:48 AM TECHNIQUE: CT of the abdomen and pelvis was performed without the administration of intravenous contrast. Multiplanar reformatted images are provided for review. Automated exposure control, iterative reconstruction, and/or weight-based adjustment of the mA/kV was utilized to reduce the radiation dose to as low as reasonably achievable. COMPARISON: 04/15/2024 CLINICAL HISTORY: Left-sided back pain for several days. FINDINGS: LOWER CHEST: No acute abnormality. LIVER: The liver is unremarkable. GALLBLADDER AND BILE DUCTS: The gallbladder has been surgically removed. No biliary ductal dilatation. SPLEEN: No acute abnormality. PANCREAS: No acute abnormality. ADRENAL GLANDS: No acute abnormality. KIDNEYS, URETERS AND BLADDER: No renal calculi or obstructive changes are noted. The bladder is well distended. GI AND BOWEL: Stomach demonstrates no acute abnormality. Small bowel is within normal limits. Scattered diverticular change of the colon is  seen without diverticulitis. The appendix is not visualized, consistent with prior surgical history. There is no bowel obstruction. PERITONEUM AND RETROPERITONEUM: No ascites. No free air. VASCULATURE: Aorta is normal in caliber. Aortic calcifications are seen. LYMPH NODES: No lymphadenopathy. REPRODUCTIVE ORGANS: The prostate is stable in appearance. 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 IMPRESSION: 1. No acute findings in the abdomen or pelvis related to left-sided back pain. 2. Status post cholecystectomy and appendectomy. 3. Scattered diverticular change of the colon without diverticulitis. Electronically signed by: Oneil Devonshire MD 04/20/2024 01:12 AM EST RP Workstation: [id number]     Procedures   Medications Ordered in the ED - No data to display                                  Medical Decision Making Amount and/or Complexity of Data Reviewed Labs:  ordered. Radiology: ordered.  Risk Prescription drug management.   Left flank pain.  Differential diagnosis includes, but is not limited to, urolithiasis, pyelonephritis, diverticulitis, abdominal aortic aneurysm.  I have reviewed his past records, and note ED visit on 04/14/2024 at which time he was complaining of abdominal pain, and CT scan showed diverticulosis without diverticulitis, no renal calculi, no aneurysm.  He was noted to be hypokalemic and was given potassium in the emergency department but not sent home with potassium prescription.  I note he is on chlorthalidone  and has had problems with hypokalemia in the past.  He likely should be maintained on a potassium supplement.  I have ordered ketorolac  for pain, ondansetron  for nausea and a noncontrast CT scan to look for evidence of diverticulitis.  I have reviewed his laboratory tests, and my interpretation is normal CBC, normal lipase, normal comprehensive metabolic panel, slightly elevated magnesium  level, urinalysis with no significant pyuria or hematuria.  CT scan shows diverticulosis without diverticulitis, no acute process.  I have independently viewed the images, and agree with the radiologist's interpretation.  He did get good relief of pain with ketorolac .  I am discharging him with instructions to use over-the-counter NSAIDs and acetaminophen  as needed.  I wonder if pain may be early herpes zoster, I have advised him to watch for development of the rash and stressed importance of seeking medical care promptly if rash does develop.     Final diagnoses:  Left flank pain    ED Discharge Orders     None          Raford Lenis, MD 04/20/24 [phone]

## 2024-04-19 NOTE — ED Notes (Addendum)
 Pt nauseated. Has had 3 Zofrans since 1500 today. C/o L sided flank 10/10 pain that is now radiating to R. Urine is frothy per pt report.   Hx neck surgery in 03/2024, New DM now taking jardiance  and glipizide (started to day), no longer taking metformin .

## 2024-04-19 NOTE — ED Triage Notes (Signed)
 Pt arrived to ED c/o left lower back pain that started Wednesday. C/o nausea due to pain. Pt states his urine output has increased.

## 2024-04-19 NOTE — ED Provider Notes (Incomplete)
 [place] EMERGENCY DEPARTMENT AT [facility] Provider Note   CSN: [id number] Arrival date & time: 04/19/24  2215     Patient presents with: Back Pain   Andrew Love is a 58 y.o. male.  {Add pertinent medical, surgical, social history, OB history to YEP:67052} The history is provided by the patient.  Back Pain  He has history of hypertension, diabetes, hyperlipidemia, chronic kidney disease, chronic back pain, GERD    Prior to Admission medications  Medication Sig Start Date End Date Taking? Authorizing Provider  chlorthalidone  (HYGROTON ) 25 MG tablet Take 1 tablet (25 mg total) by mouth daily. Patient taking differently: Take 25 mg by mouth daily with lunch. 12/25/23   Ricky Fines, MD  diphenhydrAMINE  (BENADRYL ) 25 MG tablet Take 25-50 mg by mouth 2 (two) times daily as needed for allergies.    [provider]  empagliflozin  (JARDIANCE ) 25 MG TABS tablet Take 25 mg by mouth daily with breakfast.    [provider]  EPINEPHrine  (EPIPEN ) 0.3 mg/0.3 mL SOAJ Inject 0.3 mLs (0.3 mg total) into the muscle as needed. 12/01/12   Sheran Rogue, MD  gabapentin  (NEURONTIN ) 300 MG capsule Take 300 mg by mouth in the morning and at bedtime. (Afternoon & at bedtime) 12/20/23   [provider]  HYDROcodone -acetaminophen  (NORCO/VICODIN) 5-325 MG tablet Take 1 tablet by mouth every 4 (four) hours as needed for moderate pain (pain score 4-6). 03/11/24   Onetha Arley, MD  levothyroxine  (SYNTHROID ) 50 MCG tablet Take 50 mcg by mouth daily in the afternoon.    [provider]  levothyroxine  (SYNTHROID , LEVOTHROID) 300 MCG tablet Take 300 mcg by mouth daily before breakfast. 03/26/14   [provider]  losartan  (COZAAR ) 100 MG tablet Take 1 tablet (100 mg total) by mouth daily. 12/25/23   Ricky Fines, MD  Meclizine  HCl 25 MG CHEW Chew 12.5 mg by mouth 2 (two) times daily as needed (dizziness).    [provider]  Menthol , Topical  Analgesic, 7.5 % (Roll) MISC Apply 1 each topically as needed (neck/back pain.).    [provider]  metFORMIN  (GLUCOPHAGE -XR) 750 MG 24 hr tablet Take 1 tablet (750 mg total) by mouth daily with breakfast. 12/23/23   Ricky Fines, MD  methylPREDNISolone  (MEDROL  DOSEPAK) 4 MG TBPK tablet Take 4 mg by mouth as directed. 03/18/24   [provider]  mirtazapine  (REMERON ) 15 MG tablet Take 7.5 mg by mouth at bedtime. 07/18/22   [provider]  naloxone  (NARCAN ) nasal spray 4 mg/0.1 mL Place 1 spray into the nose as needed (opioid reversal). 07/18/21   [provider]  ondansetron  (ZOFRAN ) 8 MG tablet Take 8 mg by mouth 2 (two) times daily. 03/11/24   [provider]  ondansetron  (ZOFRAN -ODT) 4 MG disintegrating tablet Take 1 tablet (4 mg total) by mouth every 8 (eight) hours as needed. 12/07/23   Onita Duos, MD  pantoprazole  (PROTONIX ) 40 MG tablet Take 40 mg by mouth daily before breakfast.    [provider]  tamsulosin  (FLOMAX ) 0.4 MG CAPS capsule Take 0.4 mg by mouth in the morning.    [provider]  tapentadol (NUCYNTA) 50 MG tablet Take 50 mg by mouth 2 (two) times daily as needed (pain.).    [provider]  tiZANidine  (ZANAFLEX ) 2 MG tablet Take 2 mg by mouth every 6 (six) hours as needed. 04/07/24   [provider]  tiZANidine  (ZANAFLEX ) 4 MG tablet Take 1 tablet (4 mg total) by mouth  at bedtime. 03/11/24   Onetha Kuba, MD  traMADol  (ULTRAM ) 50 MG tablet Take 50 mg by mouth every 6 (six) hours as needed (pain).    [provider]    Allergies: Dust mite mixed allergen ext [mite (d. farinae)], Other, Crestor  [rosuvastatin ], Almond (diagnostic), Iohexol , Oysters [shellfish allergy], Wheat, and Yeast-derived drug products    Review of Systems  Musculoskeletal:  Positive for back pain.  All other systems reviewed and are negative.   Updated Vital Signs BP 115/71   Pulse 87   Temp 97.7 F (36.5 C) (Oral)    Resp 16   Ht 6' (1.829 m)   Wt 100 kg   SpO2 95%   BMI 29.90 kg/m   Physical Exam Vitals and nursing note reviewed.   58 year old male, resting comfortably and in no acute distress. Vital signs are ***. Oxygen saturation is ***%, which is normal. Head is normocephalic and atraumatic. PERRLA, EOMI. Oropharynx is clear. Neck is nontender and supple without adenopathy or JVD. Back is nontender and there is no CVA tenderness. Lungs are clear without rales, wheezes, or rhonchi. Chest is nontender. Heart has regular rate and rhythm without murmur. Abdomen is soft, flat, nontender without masses or hepatosplenomegaly and peristalsis is normoactive. Extremities have no cyanosis or edema, full range of motion is present. Skin is warm and dry without rash. Neurologic: Mental status is normal, cranial nerves are intact, there are no motor or sensory deficits.  (all labs ordered are listed, but only abnormal results are displayed) Labs Reviewed - No data to display  EKG: None  Radiology: No results found.  {Document cardiac monitor, telemetry assessment procedure when appropriate:32947} Procedures   Medications Ordered in the ED - No data to display    {Click here for ABCD2, HEART and other calculators REFRESH Note before signing:1}                              Medical Decision Making  ***  {Document critical care time when appropriate  Document review of labs and clinical decision tools ie CHADS2VASC2, etc  Document your independent review of radiology images and any outside records  Document your discussion with family members, caretakers and with consultants  Document social determinants of health affecting pt's care  Document your decision making why or why not admission, treatments were needed:32947:::1}   Final diagnoses:  None    ED Discharge Orders     None

## 2024-04-20 ENCOUNTER — Emergency Department (HOSPITAL_COMMUNITY)

## 2024-04-20 LAB — COMPREHENSIVE METABOLIC PANEL WITH GFR
ALT: 33 U/L (ref 0–44)
AST: 29 U/L (ref 15–41)
Albumin: 4.5 g/dL (ref 3.5–5.0)
Alkaline Phosphatase: 70 U/L (ref 38–126)
Anion gap: 10 (ref 5–15)
BUN: 16 mg/dL (ref 6–20)
CO2: 29 mmol/L (ref 22–32)
Calcium: 9.7 mg/dL (ref 8.9–10.3)
Chloride: 102 mmol/L (ref 98–111)
Creatinine, Ser: 1.19 mg/dL (ref 0.61–1.24)
GFR, Estimated: >60 mL/min (ref >60)
Glucose, Bld: 123 mg/dL — ABNORMAL HIGH (ref 70–99)
Potassium: 4.2 mmol/L (ref 3.5–5.1)
Sodium: 140 mmol/L (ref 135–145)
Total Bilirubin: 0.4 mg/dL (ref 0.0–1.2)
Total Protein: 7.3 g/dL (ref 6.5–8.1)

## 2024-04-20 LAB — CBC WITH DIFFERENTIAL/PLATELET
Abs Immature Granulocytes: 0.05 K/uL (ref 0.00–0.07)
Basophils Absolute: 0.1 K/uL (ref 0.0–0.1)
Basophils Relative: 1 %
Eosinophils Absolute: 0.3 K/uL (ref 0.0–0.5)
Eosinophils Relative: 3 %
HCT: 42.8 % (ref 39.0–52.0)
Hemoglobin: 15 g/dL (ref 13.0–17.0)
Immature Granulocytes: 1 %
Lymphocytes Relative: 31 %
Lymphs Abs: 2.4 K/uL (ref 0.7–4.0)
MCH: 32.3 pg (ref 26.0–34.0)
MCHC: 35 g/dL (ref 30.0–36.0)
MCV: 92 fL (ref 80.0–100.0)
Monocytes Absolute: 0.8 K/uL (ref 0.1–1.0)
Monocytes Relative: 10 %
Neutro Abs: 4.3 K/uL (ref 1.7–7.7)
Neutrophils Relative %: 54 %
Platelets: 243 K/uL (ref 150–400)
RBC: 4.65 MIL/uL (ref 4.22–5.81)
RDW: 12.9 % (ref 11.5–15.5)
WBC: 7.8 K/uL (ref 4.0–10.5)
nRBC: 0 % (ref 0.0–0.2)

## 2024-04-20 LAB — URINALYSIS, W/ REFLEX TO CULTURE (INFECTION SUSPECTED)
Bacteria, UA: NONE SEEN
Bilirubin Urine: NEGATIVE
Glucose, UA: >=500 mg/dL — AB
Hgb urine dipstick: NEGATIVE
Ketones, ur: NEGATIVE mg/dL
Leukocytes,Ua: NEGATIVE
Nitrite: NEGATIVE
Protein, ur: NEGATIVE mg/dL
Specific Gravity, Urine: 1.024 (ref 1.005–1.030)
pH: 7 (ref 5.0–8.0)

## 2024-04-20 LAB — LIPASE, BLOOD: Lipase: 26 U/L (ref 11–51)

## 2024-04-20 LAB — MAGNESIUM: Magnesium: 2.5 mg/dL — ABNORMAL HIGH (ref 1.7–2.4)

## 2024-04-20 MED ORDER — MAGNESIUM SULFATE 2 GM/50ML IV SOLN
2.0000 g | Freq: Once | INTRAVENOUS | Status: DC
  Filled 2024-04-20: qty 50

## 2024-04-20 MED ORDER — ONDANSETRON HCL 4 MG/2ML IJ SOLN
4.0000 mg | Freq: Once | INTRAMUSCULAR | Status: DC
  Filled 2024-04-20: qty 2

## 2024-04-20 MED ORDER — KETOROLAC TROMETHAMINE 30 MG/ML IJ SOLN
30.0000 mg | Freq: Once | INTRAMUSCULAR | Status: AC
  Administered 2024-04-20: 30 mg via INTRAVENOUS
  Filled 2024-04-20: qty 1

## 2024-04-20 MED ORDER — PROCHLORPERAZINE EDISYLATE 10 MG/2ML IJ SOLN
10.0000 mg | Freq: Once | INTRAMUSCULAR | Status: AC
  Administered 2024-04-20: 10 mg via INTRAVENOUS
  Filled 2024-04-20: qty 2

## 2024-04-20 NOTE — Progress Notes (Signed)
 Discharge instructions explained. No questions at this time. Pt ambulatory with all belongings.

## 2024-04-20 NOTE — ED Notes (Signed)
 Patient transported to CT

## 2024-04-20 NOTE — Discharge Instructions (Addendum)
 Your evaluation did not show any serious cause for your pain.  I am concerned that it may be from shingles.  Please watch to see if you develop a rash.  If you develop a rash in the same spot where you are hurting, please see a medical provider soon as possible to get started on medication for shingles.  You may take acetaminophen  and/or ibuprofen  as needed for pain.  Please note that if you combine acetaminophen  and ibuprofen , you will get better pain relief and you get from taking other medication by itself.  Please work with your primary care provider to monitor your potassium.  As long as you are taking chlorthalidone , you will be prone to low potassium levels and will likely need supplements.

## 2024-04-25 ENCOUNTER — Telehealth (HOSPITAL_COMMUNITY): Payer: Self-pay

## 2024-04-25 NOTE — Telephone Encounter (Signed)
 Second attempt to contact patient at [phone] to schedule swallow study. Left VM and request for call back.

## 2024-04-29 ENCOUNTER — Other Ambulatory Visit (HOSPITAL_COMMUNITY): Payer: Self-pay | Admitting: Neurosurgery

## 2024-04-29 DIAGNOSIS — R1310 Dysphagia, unspecified: Secondary | ICD-10-CM

## 2024-05-21 ENCOUNTER — Telehealth: Payer: Self-pay | Admitting: Neurology

## 2024-05-21 NOTE — Telephone Encounter (Signed)
 Noted. Encounter closed.

## 2024-05-21 NOTE — Telephone Encounter (Signed)
 Patient cancelled appointment due to neck surgery. Patient decided not to reschedule due to being seen by another neurologist.

## 2024-05-22 ENCOUNTER — Ambulatory Visit: Admitting: Neurology

## 2024-05-30 ENCOUNTER — Ambulatory Visit (HOSPITAL_COMMUNITY): Admission: RE | Admit: 2024-05-30 | Source: Ambulatory Visit

## 2024-05-30 ENCOUNTER — Ambulatory Visit (HOSPITAL_COMMUNITY)
Admission: RE | Admit: 2024-05-30 | Discharge: 2024-05-30 | Disposition: A | Source: Ambulatory Visit | Attending: *Deleted | Admitting: *Deleted

## 2024-05-30 DIAGNOSIS — R1310 Dysphagia, unspecified: Secondary | ICD-10-CM

## 2024-05-30 DIAGNOSIS — F458 Other somatoform disorders: Secondary | ICD-10-CM | POA: Insufficient documentation

## 2024-05-30 NOTE — Therapy (Incomplete)
 Modified Barium Swallow Study  Patient Details  Name: GENEVIEVE RITZEL MRN: [id number] Date of Birth: 1965-12-18  Today's Date: 05/30/2024  Modified Barium Swallow completed.  Full report located under Chart Review in the Imaging Section.  History of Present Illness 59 yr old seen for outpatient MBS with complaints of frequent coughing during meals primarily with meats, pharyngeal globus sensation since ACDF 03/10/2024. He described significant episodes of sensation of brief inability to breathe followed by forceful cough with expulsion of meats and has modified his food textures. PMH: cervical spinal fusion C4-C6 (date unknown), TIA/stoke 2015, Schatzki's ring, hiatal hernia, GERD.   Clinical Impression  Pt demonstrates a functional oropharyngeal swallow with chronic cough during study without penetration/aspiration present. Suspect pt may have sensory changes with hypersensitivity to even trace-min residue s/p surgery. He may be experiencing ILO (inducible laryngeal obstruction) given reported episodes of sensing airway obstruction during meals. Recommend outpatient ST for possible ILO.   He does have partial epiglottic inversion at the level of cervical hardware however able to fully protect airway during swallow. No laryngeal penetration or aspiration. There is trace-min residue in valleculae, slightly increased in valleculae with honey thick from reduced epiglottic deflection and pyriform sinuses that is not typically sensed. Pt had multiple coughing and throat clearing instances throughout study reporting globus sensation. Esophageal scan did not reveal significant stasis. Suspect pt may have sensory changes with hypersensitivity to even trace-min residue s/p surgery. 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 Recommend continue thin liquids, regular  texture using caution with meats/breads (cutting meats into small pieces cooked moist), pills as tolerated (pt has been cutting in half). Educated pt verbally and with handout re: strategies for cough suppression typically used for pt's with chronic cough for possible assist during meals. Recommend outpatient ST for ILO strategies and/or ENT consult if warranted. Factors that may increase risk of adverse event in presence of aspiration Noe & Lianne 2021):    Swallow Evaluation Recommendations Recommendations: PO diet PO Diet Recommendation: Thin liquids (Level 0);Regular (caution with meats, bread) Liquid Administration via: Straw;Cup Medication Administration: Whole meds with puree Supervision: Patient able to self-feed Postural changes: Position pt fully upright for meals Oral care recommendations: Oral care BID (2x/day)      Dustin Olam Bull 05/30/2024,6:50 PM
